# Patient Record
Sex: Male | Born: 1965
Health system: Southern US, Community
[De-identification: ages and names within clinical notes are randomized; demographics above are authoritative.]

## PROBLEM LIST (undated history)

## (undated) DIAGNOSIS — Z9861 Coronary angioplasty status: Secondary | ICD-10-CM

## (undated) DIAGNOSIS — M1990 Unspecified osteoarthritis, unspecified site: Secondary | ICD-10-CM

## (undated) DIAGNOSIS — E119 Type 2 diabetes mellitus without complications: Secondary | ICD-10-CM

## (undated) DIAGNOSIS — M109 Gout, unspecified: Secondary | ICD-10-CM

## (undated) DIAGNOSIS — I10 Essential (primary) hypertension: Secondary | ICD-10-CM

## (undated) DIAGNOSIS — Z87820 Personal history of traumatic brain injury: Secondary | ICD-10-CM

## (undated) DIAGNOSIS — K611 Rectal abscess: Secondary | ICD-10-CM

## (undated) DIAGNOSIS — I81 Portal vein thrombosis: Secondary | ICD-10-CM

## (undated) DIAGNOSIS — K55069 Acute infarction of intestine, part and extent unspecified: Secondary | ICD-10-CM

## (undated) DIAGNOSIS — E785 Hyperlipidemia, unspecified: Secondary | ICD-10-CM

## (undated) DIAGNOSIS — I491 Atrial premature depolarization: Secondary | ICD-10-CM

## (undated) DIAGNOSIS — G43909 Migraine, unspecified, not intractable, without status migrainosus: Secondary | ICD-10-CM

## (undated) DIAGNOSIS — R351 Nocturia: Secondary | ICD-10-CM

## (undated) DIAGNOSIS — I2510 Atherosclerotic heart disease of native coronary artery without angina pectoris: Secondary | ICD-10-CM

## (undated) DIAGNOSIS — I214 Non-ST elevation (NSTEMI) myocardial infarction: Secondary | ICD-10-CM

## (undated) DIAGNOSIS — C61 Malignant neoplasm of prostate: Secondary | ICD-10-CM

## (undated) DIAGNOSIS — R600 Localized edema: Secondary | ICD-10-CM

## (undated) DIAGNOSIS — T82867A Thrombosis of cardiac prosthetic devices, implants and grafts, initial encounter: Secondary | ICD-10-CM

## (undated) DIAGNOSIS — H5461 Unqualified visual loss, right eye, normal vision left eye: Secondary | ICD-10-CM

## (undated) DIAGNOSIS — Z87438 Personal history of other diseases of male genital organs: Secondary | ICD-10-CM

## (undated) DIAGNOSIS — I252 Old myocardial infarction: Secondary | ICD-10-CM

## (undated) DIAGNOSIS — I871 Compression of vein: Secondary | ICD-10-CM

## (undated) DIAGNOSIS — I639 Cerebral infarction, unspecified: Secondary | ICD-10-CM

## (undated) DIAGNOSIS — I739 Peripheral vascular disease, unspecified: Secondary | ICD-10-CM

## (undated) DIAGNOSIS — M1A9XX0 Chronic gout, unspecified, without tophus (tophi): Secondary | ICD-10-CM

## (undated) HISTORY — PX: CORONARY ANGIOPLASTY WITH STENT PLACEMENT: SHX49

## (undated) HISTORY — PX: TONSILLECTOMY: SUR1361

## (undated) HISTORY — DX: Atrial premature depolarization: I49.1

## (undated) HISTORY — PX: SEPTOPLASTY: SUR1290

## (undated) HISTORY — PX: TONSILLECTOMY AND ADENOIDECTOMY: SUR1326

## (undated) HISTORY — DX: Unqualified visual loss, right eye, normal vision left eye: H54.61

## (undated) HISTORY — PX: CORONARY ANGIOPLASTY: SHX604

---

## 1999-08-05 HISTORY — PX: SEPTOPLASTY: SUR1290

## 2001-11-01 ENCOUNTER — Ambulatory Visit (HOSPITAL_BASED_OUTPATIENT_CLINIC_OR_DEPARTMENT_OTHER): Admission: RE | Admit: 2001-11-01 | Discharge: 2001-11-01 | Payer: Self-pay | Admitting: Otolaryngology

## 2002-05-21 ENCOUNTER — Emergency Department (HOSPITAL_COMMUNITY): Admission: EM | Admit: 2002-05-21 | Discharge: 2002-05-21 | Payer: Self-pay | Admitting: Emergency Medicine

## 2002-07-06 ENCOUNTER — Encounter: Payer: Self-pay | Admitting: Internal Medicine

## 2002-07-06 ENCOUNTER — Inpatient Hospital Stay (HOSPITAL_COMMUNITY): Admission: EM | Admit: 2002-07-06 | Discharge: 2002-07-09 | Payer: Self-pay | Admitting: Emergency Medicine

## 2002-07-08 ENCOUNTER — Encounter: Payer: Self-pay | Admitting: Internal Medicine

## 2009-08-04 DIAGNOSIS — I639 Cerebral infarction, unspecified: Secondary | ICD-10-CM

## 2009-08-04 DIAGNOSIS — I219 Acute myocardial infarction, unspecified: Secondary | ICD-10-CM

## 2009-08-04 HISTORY — DX: Cerebral infarction, unspecified: I63.9

## 2009-08-04 HISTORY — DX: Acute myocardial infarction, unspecified: I21.9

## 2010-05-04 HISTORY — PX: INGUINAL HERNIA REPAIR: SUR1180

## 2010-07-01 ENCOUNTER — Inpatient Hospital Stay (HOSPITAL_COMMUNITY): Admission: EM | Admit: 2010-07-01 | Discharge: 2010-07-03 | Payer: Self-pay | Admitting: Emergency Medicine

## 2010-07-01 DIAGNOSIS — I6930 Unspecified sequelae of cerebral infarction: Secondary | ICD-10-CM

## 2010-07-01 DIAGNOSIS — Z955 Presence of coronary angioplasty implant and graft: Secondary | ICD-10-CM

## 2010-07-01 HISTORY — DX: Unspecified sequelae of cerebral infarction: I69.30

## 2010-07-01 HISTORY — DX: Presence of coronary angioplasty implant and graft: Z95.5

## 2010-07-02 ENCOUNTER — Encounter (INDEPENDENT_AMBULATORY_CARE_PROVIDER_SITE_OTHER): Payer: Self-pay | Admitting: Cardiovascular Disease

## 2010-07-04 DIAGNOSIS — I214 Non-ST elevation (NSTEMI) myocardial infarction: Secondary | ICD-10-CM | POA: Diagnosis present

## 2010-07-04 HISTORY — PX: CORONARY ANGIOPLASTY WITH STENT PLACEMENT: SHX49

## 2010-08-24 ENCOUNTER — Encounter: Payer: Self-pay | Admitting: Family Medicine

## 2010-10-15 LAB — HEMOGLOBIN AND HEMATOCRIT, BLOOD: HCT: 39.6 % (ref 39.0–52.0)

## 2010-10-15 LAB — BASIC METABOLIC PANEL
CO2: 26 mEq/L (ref 19–32)
Calcium: 8.5 mg/dL (ref 8.4–10.5)
Chloride: 113 mEq/L — ABNORMAL HIGH (ref 96–112)
Creatinine, Ser: 0.84 mg/dL (ref 0.4–1.5)
Creatinine, Ser: 1.01 mg/dL (ref 0.4–1.5)
GFR calc Af Amer: >60 mL/min (ref >60)
GFR calc Af Amer: >60 mL/min (ref >60)
GFR calc Af Amer: >60 mL/min (ref >60)
GFR calc non Af Amer: >60 mL/min (ref >60)
GFR calc non Af Amer: >60 mL/min (ref >60)
Potassium: 3.7 mEq/L (ref 3.5–5.1)
Sodium: 138 mEq/L (ref 135–145)
Sodium: 139 mEq/L (ref 135–145)

## 2010-10-15 LAB — POCT CARDIAC MARKERS: Myoglobin, poc: 95.3 ng/mL (ref 12–200)

## 2010-10-15 LAB — DIFFERENTIAL
Basophils Relative: 0 % (ref 0–1)
Eosinophils Absolute: 0.1 10*3/uL (ref 0.0–0.7)
Monocytes Relative: 8 % (ref 3–12)
Neutrophils Relative %: 62 % (ref 43–77)

## 2010-10-15 LAB — TSH: TSH: 0.999 u[IU]/mL (ref 0.350–4.500)

## 2010-10-15 LAB — POCT I-STAT, CHEM 8
Creatinine, Ser: 1 mg/dL (ref 0.4–1.5)
Glucose, Bld: 116 mg/dL — ABNORMAL HIGH (ref 70–99)
Hemoglobin: 16.3 g/dL (ref 13.0–17.0)
Potassium: 4 mEq/L (ref 3.5–5.1)

## 2010-10-15 LAB — PROTIME-INR
INR: 1.06 (ref 0.00–1.49)
Prothrombin Time: 13.9 seconds (ref 11.6–15.2)

## 2010-10-15 LAB — CBC
Hemoglobin: 15.8 g/dL (ref 13.0–17.0)
MCH: 29.5 pg (ref 26.0–34.0)
MCH: 29.6 pg (ref 26.0–34.0)
MCHC: 34.1 g/dL (ref 30.0–36.0)
Platelets: 235 10*3/uL (ref 150–400)
Platelets: 267 10*3/uL (ref 150–400)
RBC: 4.57 MIL/uL (ref 4.22–5.81)
WBC: 9.4 10*3/uL (ref 4.0–10.5)

## 2010-10-15 LAB — CK TOTAL AND CKMB (NOT AT ARMC)
CK, MB: 28.8 ng/mL (ref 0.3–4.0)
Relative Index: 11 — ABNORMAL HIGH (ref 0.0–2.5)

## 2010-10-15 LAB — D-DIMER, QUANTITATIVE: D-Dimer, Quant: <0.22 ug/mL-FEU (ref 0.00–0.48)

## 2010-10-15 LAB — HEMOGLOBIN A1C
Hgb A1c MFr Bld: 6 % — ABNORMAL HIGH (ref <5.7)
Mean Plasma Glucose: 126 mg/dL — ABNORMAL HIGH (ref <117)

## 2010-10-15 LAB — HEPARIN LEVEL (UNFRACTIONATED): Heparin Unfractionated: <0.1 IU/mL — ABNORMAL LOW (ref 0.30–0.70)

## 2010-10-15 LAB — APTT: aPTT: 65 seconds — ABNORMAL HIGH (ref 24–37)

## 2010-10-15 LAB — LIPID PANEL: Triglycerides: 187 mg/dL — ABNORMAL HIGH (ref <150)

## 2010-12-20 NOTE — Discharge Summary (Signed)
NAME:  Dylan [name], Dylan [name]                      ACCOUNT NO.:  [id number]   MEDICAL RECORD NO.:  [id number]                   PATIENT TYPE:  INP   LOCATION:  5702                                 FACILITY:  [facility]   PHYSICIAN:  Lina Sar, M.D. [facility]               DATE OF BIRTH:  [birth year]   DATE OF ADMISSION:  07/06/2002  DATE OF DISCHARGE:  07/09/2002                                 DISCHARGE SUMMARY   ADMITTING DIAGNOSES:  1. Right-sided abdominal pain, possibly secondary to pancreatitis.  2. Rule out cholecystitis.  3. Hypertension, likely exacerbated by acute pain.  4. Ongoing tobacco abuse.  5. Status post tonsillectomy and adenoidectomy.  6. Status post septoplasty for repair of deviated septum.   DISCHARGE DIAGNOSES:  1. Right-sided abdominal pain, question passed common bile duct stone.  2. Possible mild pancreatitis owing to biliary stone.  3. Possible musculoskeletal pain as source for abdominal pain.  4. Somewhat complex left renal cyst; this will require a follow-up CT in six     months.  5. Bibasilar atelectasis; CT scan of the chest showed no evidence for     pulmonary embolus.  6. Bilateral pleural effusions and nodularity on CT of chest.  Dr. Delford Field     feels that these are granulomas and do not need follow-up CT.   PROCEDURES:  None.   CONSULTATIONS:  1. Shan Levans, M.D.  2. Adolph Pollack, M.D.   BRIEF HISTORY:  The patient is a healthy 45 year old white male.  A few days  prior to admission, on a Saturday, he developed acute right-sided abdominal  pain persisting over the next few days.  On July 04, 2002 he was  evaluated at [facility] where he had abdominal x-rays and  laboratory tests.  His x-rays were unremarkable.  Before the labs returned  he was prescribed p.r.n. Vicodin as well as a course of Levaquin, presumably  thinking he may have diverticulitis.  However, when the lipase returned this  was slightly elevated at 94 and the  doctor contacted him at home with an  appointment for him to be seen by Dr. Anselmo Rod on July 21, 2002.  The pain became such that the Vicodin was not holding it.  He was not having  any nausea, vomiting, or sweats, but was having anorexia.  Therefore, he  presented to the emergency room for evaluation by the on-call GI team, which  was [facility].  Dr. Juanda Chance evaluated him and admitted him for  management of pain possibly secondary to pancreatitis.  Repeat labs in the  emergency room revealed normal amylase and lipase but slight elevation of  AST and ALT.   LABORATORY DATA:  White blood cell count 10.3; hemoglobin 17; hematocrit  50.7; platelets 329,000; MCV 84.3.  PT 13.6, INR 1.0, PTT 32.  Sodium 138,  potassium 3.9, glucose 90, BUN 12, creatinine 0.8.  Total bilirubin 0.7,  alkaline phosphatase 92.  AST went from 57 down to 27.  ALT went from 111  down to 64.  Hepatitis A antibody negative, hepatitis B surface antigen  negative, hepatitis B core antibody negative, hepatitis B surface antibody  negative, hepatitis C antibody negative.  Urinalysis was unremarkable and  urine culture did not have any growth.  Mono screening was negative.   Besides the lab tests which revealed some mild abnormality of the  transaminases, he also underwent imaging studies.  Ultrasound of the abdomen  showed fatty infiltration in the liver, a left renal cyst.  Pancreas  unfortunately was not well visualized.  CT scan of the abdomen showed  probable tiny amount of fluid along the inferior liver edge of uncertain  etiology.  This tracked along the retroperitoneal vein.  The portal vein,  splenic vein, and superior mesenteric vein were patent.  The left renal cyst  was also again visualized and was minimally complex.  This renal cyst was  felt to be probably benign but categorized as Bosniak category 46F type  lesion which would require follow-up with CT scan in six months.  Also seen  were  bibasilar subsegmental atelectasis.  Pelvic views on CT were normal.  Hepatobiliary scan was normal.  Chest CT revealed no pulmonary embolus; some  scattered granulomas were also seen.  A nonspecific nodule in the left upper  lobe evident.  Six-month follow-up chest CT recommended.  Small hilar and  mediastinal lymph node, possibly reactive in nature.   HOSPITAL COURSE:  The patient was admitted for bowel rest and IV fluids.  On  hospital day #2 pain was well controlled with regular doses of Demerol.  The  patient underwent a series of tests to define whether or not he had any  gallstones in the duct or any cholecystitis.  He was seen by Dr. Abbey Chatters  for surgical evaluation.  He felt that possibly he could have had passage of  a single gallstone.  Other diagnosed in the differential included ruptured  hepatic cyst or hemangioma, costochondritis, and musculoskeletal source of  pain.   The patient's transaminases were elevated at about two to three times normal  when he came in but total bilirubin and alkaline phosphatase both were  within normal limits.  These transaminases rapidly declined over the course  of his three-day hospital stay.  No stones or evidence for cholecystitis or  cholelithiasis were identified.  Dr. Abbey Chatters prescribed some Robaxin.  In  the final assessment Dr. Abbey Chatters felt that the pain which he had observed  was probably musculoskeletal in origin.   Regarding abnormal CT scan, the patient was evaluated by Dr. Shan Levans.  Hypoxemia had been noted on spot check of oxygen saturation and he felt that  this was secondary to splinting associated with his pain.  He thought that  the nodules on CT were granulomas and would not need a follow-up CT.  Dr.  Delford Field raised the question of whether or not the patient's pain might be  urologic in nature.  However, on imaging studies he did not have any evidence for gallstones.  He did have the slightly complicated renal  cyst  observed.  No blood was seen in the urine.   In any event, the patient's right upper quadrant pain had improved  significantly to the point where the patient was felt ready for discharge  home.  He is to follow up p.r.n. with Dr. Juanda Chance.  He should see Dr. Yetta Barre  in  the next month or so for follow-up. Again, per the CT findings regarding  the renal cyst, this will require a follow-up CT scan but this can be  arranged by Dr. Yetta Barre or any of the other doctors at [facility].     Brett Canales, P.A. [facility]                    Lina Sar, M.D. [facility]    SG/MEDQ  D:  09/16/2002  T:  09/16/2002  Job:  [id number]   cc:   Adolph Pollack, M.D.  [address]  [place]  [place] [address]  Fax: [phone]   Knox Royalty, M.D.  [facility]

## 2010-12-20 NOTE — Discharge Summary (Signed)
   NAME:  Dylan [name], Dylan [name]                      ACCOUNT NO.:  [id number]   MEDICAL RECORD NO.:  [id number]                   PATIENT TYPE:  INP   LOCATION:  5702                                 FACILITY:  [facility]   PHYSICIAN:  Lina Sar, M.D. [facility]               DATE OF BIRTH:  May 13, 1966   DATE OF ADMISSION:  07/06/2002  DATE OF DISCHARGE:  07/09/2002                                 DISCHARGE SUMMARY   ADDENDUM:   DISCHARGE MEDICATIONS:  Vicodin one to two p.o. q.6h. p.r.n. pain.     Brett Canales, P.A. [facility]                    Lina Sar, M.D. [facility]    SG/MEDQ  D:  09/16/2002  T:  09/16/2002  Job:  [id number]   cc:   Adolph Pollack, M.D.  [address]  [place]  [place] [address]  Fax: [phone]   Knox Royalty, M.D.

## 2010-12-20 NOTE — Op Note (Signed)
[facility]  Patient:    Dylan [name], Dylan [name] Visit Number: [id number] MRN: [id number]          Service Type: DSU Location: [facility] Attending Physician:  Susy Frizzle Dictated by:   Jeannett Senior Pollyann Kennedy, M.D. Proc. Date: 11/01/01 Admit Date:  11/01/2001   CC:         Bertram Millard. Hyacinth Meeker, M.D.   Operative Report  PREOPERATIVE DIAGNOSIS: 1. Deviated septum. 2. Inferior turbinate hypertrophy.  POSTOPERATIVE DIAGNOSIS: 1. Deviated septum. 2. Inferior turbinate hypertrophy.  OPERATION PERFORMED: 1. Nasal septoplasty. 2. Submucous resection of the right inferior turbinate. 3. Outfracture of the left inferior turbinate.  SURGEON:  Jefry H. Pollyann Kennedy, M.D.  ANESTHESIA:  General endotracheal.  COMPLICATIONS:  None.  ESTIMATED BLOOD LOSS:  30 cc.  OPERATIVE FINDINGS:  Severe leftward deviation of the cartilaginous and bony septum.  Complete obstruction of the left inferior meatus.  The septum was completely obscuring the inferior turbinate anteriorly.  Posteriorly, the inferior turbinate was thickened and elongated with posterior obstruction.  On the right side the inferior turbinate was severely enlarged with very dramatic bony hypertrophy.  There was a mucosal synechia between the midportion of the nasal septum to the midportion of the inferior turbinate on the right side. The patient tolerated the procedure well, was awakened, extubated and transferred to recovery in stable condition.  REFERRING PHYSICIAN:  Bertram Millard. Hyacinth Meeker, M.D.  INDICATIONS FOR PROCEDURE:  The patient is a 45 year old with a history of severe nasal obstruction and recurring sinus infections, especially, left maxillary sinus.  The risks, benefits, alternatives and complications of the procedure were explained to the patient, who seemed to understand and agreed to surgery.  DESCRIPTION OF PROCEDURE:  The patient was taken to the operating room and placed on the operating table in  the supine position.  Following induction of general endotracheal anesthesia, the patient was draped in the standard fashion.  Oxymetazoline spray was used preoperatively in the nose for decongesting.  1% Xylocaine with 1:100,000 epinephrine was infiltrated into the septum and columella and the inferior turbinates bilaterally.  A total of 4 cc was used.  Oxymetazoline soaked pledgets were then placed bilaterally in the nasal cavities.  1 - Nasal septoplasty.  A left hemitransfixion incision was created with a 15 scalpel to approach the septal cartilage and the mucoperichondrial flap was developed posteriorly to the sphenoid rostrum.  Several tears occurred in the mucosa on the left side because of the severe curvature.  The mucosa was kept intact throughout the procedure on the right side.  The superior aspect of the bony cartilaginous junction was divided with a Therapist, nutritional and a similar flap was developed posteriorly down the right side.  The ethmoid plate was severely deviated to the left inferiorly.  The entire ethmoid plate was resected.  This allowed better visualization of the posterior nasal cavity, especially on the left  side.  The upper aspect of the vomerine bone was removed also.  the posterior and inferior aspect was kept intact as that was straight and thin.  The quadrangular cartilage was buckled on the left side posteriorly and inferiorly.  This segment was resected.  The more anterior quadrangular cartilage was elongated and compressed with a convexity on the left side.  A 2 mm strip was resected all the way to the caudal limit and this allowed the remaining cartilage to relax and lie more towards the midline. The maxillary crest was partially widened and deflected to the left  and was trimmed using a 4 mm osteotome.  The mucosal incision was then reapproximated with 4-0 chromic suture.  4-0 plain gut was used to quilt the septal flaps and to try to reappose the mucosal  tears on the left side.  Silastic splints were created bilaterally, coated with bacitracin ointment and secured in place with a nylon suture through-and-through the septum.  2 - Submucous resection of right inferior turbinate.  The leading edge of the inferior turbinate on the right was incised in a vertical fashion with a 15 scalpel.  A Freer elevator was used to elevate mucosa off the bony turbinate all the way back posteriorly from the anterior edge.  A very large fragment of turbinate bone was resected with Lenoria Chime forceps.  The remaining turbinate was outfractured toward the lateral aspect.  3 - Outfracture of left inferior turbinate.  The posterior aspect of the left inferior turbinate was hypertrophied.  It was simply outfractured as the bone was very thin.  This provided increased airway on the left side posteriorly. The nasal cavities and pharynx were suctioned of blood and secretions and the nasal cavities were packed with rolled up Telfa gauze coated with bacitracin ointment.  The patient was then awakened, extubated and transferred to recovery. Dictated by:   Jeannett Senior Pollyann Kennedy, M.D. Attending Physician:  Susy Frizzle DD:  11/01/01 TD:  11/01/01 Job: [id number]

## 2010-12-20 NOTE — Consult Note (Signed)
NAME:  Dylan [name], Dylan [name]                      ACCOUNT NO.:  [id number]   MEDICAL RECORD NO.:  [id number]                   PATIENT TYPE:  INP   LOCATION:  5702                                 FACILITY:  [facility]   PHYSICIAN:  Shan Levans, M.D. [facility]            DATE OF BIRTH:  March 23, 1966   DATE OF CONSULTATION:  DATE OF DISCHARGE:  07/09/2002                                   CONSULTATION   CHIEF COMPLAINT:  Right flank pain.   HISTORY OF PRESENT ILLNESS:  45 year old, white male, previously healthy  smoker had the acute onset six days ago of right upper quadrant pain and  right mid back pain two hours after a large meal.  The pain was described as  fullness, then sharp, stabbing right upper quadrant and into the right  flank.  Currently now, he describes the pain beginning in the right flank  and rotating through the right upper quadrant anteriorly.  He has denied any  nausea or vomiting.  Bowel movements are okay.  He had a mild elevation in  the lipase, had a normal CBC.  Had an abnormal urinalysis with trace blood  and white cells in the urine.  The patient was given Levaquin transiently,  he did not really improve but presented to the emergency room with  increasing abdominal pain and flank pain.  Currently, he denies any fever,  chills, sweats, nausea, vomiting or diarrhea.  We are asked to see the  patient because there is a question of whether some of this pain could be  pleuritic.  He had a spiral CT done of the chest this afternoon showing no  evidence of pulmonary emboli.  There were some nodules seen, we are asked to  address the nodules as well.  There is also a mild reduction in SPO2, we are  asked to address this as well.   PAST MEDICAL HISTORY:  No chronic illnesses, has had a tonsillectomy and  septoplasty.   ALLERGIES:  None.   MEDICATIONS:  Currently Protonix, Demerol p.r.n., Phenergan.   SOCIAL HISTORY:  The patient is a smoker, occasionally drinks but has  not  been drinking recently.  Otherwise, noncontributory.   REVIEW OF SYSTEMS:  Noncontributory.   PHYSICAL EXAMINATION:  GENERAL:  This is a pleasant, well-developed, well-  nourished, white male in no acute distress.  VITAL SIGNS:  Temperature is 98, blood pressure 146/92, pulse 82,  respirations 20, saturation 93% on room air.  CHEST:  Showed to be entirely clear to auscultation and percussion, there is  no evidence of wheeze or rhonchi.  There are good breath sounds bilaterally.  CARDIAC EXAM:  Showed a regular rate and rhythm at S3, normal S1 and S2.  ABDOMEN:  Slightly tender in the right upper quadrant, soft.  Liver was not  enlarged.  Right flank also was tender to palpation.  The remaining portions  of abdominal exam are unremarkable.  EXTREMITIES:  Showed no clubbing, edema or venous disease.  NEUROLOGICAL EXAM:  Intact.  HEENT:  Showed no jugular venous distention, no lymphadenopathy, oropharynx  clear.  NECK:  Supple.   LABORATORY DATA:  Hepatitis antibodies are all negative.  SGOT 44, SGPT 77,  alkaline phosphatase 71, total bilirubin 1.0.  PT and PTT normal.  White  count 10.3, hemoglobin 17, sodium 138, potassium 4.9 and chloride 106.  CO2  25, BUN 12, creatinine 0.8, blood sugar of 90.  Urinalysis showed small  bilirubin, trace leukocyte esterase, negative hemoglobin here with 3-6 white  cells, rare bacteria.  At the Urgent Care Clinic, there were trace red cells  seen on the analysis and there was some leukocyte esterase seen as well.  Trace leukocyte seen.  Abdominal CT unremarkable.  Abdominal ultrasound  unremarkable.  There was a left renal cyst 3 cm in the left upper pole of  the left kidney.  CT of the chest showed a few scattered granulomas, no  pulmonary embolism, low lung volumes in the right base.  No pleural disease.   IMPRESSION:  The patient does not likely has pleuritic chest pain, but  rather this appears to be an intra-abdominal source of pain.  The  patient  may have passed a gallbladder stone, so biliary colic is in differential  diagnosis with increased liver function tests and lipase.  Also,  pyelonephritis or renal colic with renal stone disease is in the  differential with an abnormal urinalysis.  Low SPO2 is due to splinting and  low lung volumes.  The nodules on the CT of the chest are granulomas and do  not need follow up.  The patient has not had pulmonary embolism.  Low back  pain from disk disease is also in the differential, but appears to be less  likely.   RECOMMENDATIONS:  Would suggest a urology consultation and re-check  urinalysis and urine C&S.  Also administer incentive spirometry to the  patient.  Follow the patient expectantly.                                               Shan Levans, M.D. [facility]    PW/MEDQ  D:  07/08/2002  T:  07/09/2002  Job:  [id number]

## 2010-12-20 NOTE — H&P (Signed)
NAME:  Dylan [name], Dylan [name]                      ACCOUNT NO.:  [id number]   MEDICAL RECORD NO.:  [id number]                   PATIENT TYPE:  INP   LOCATION:  1828                                 FACILITY:  [facility]   PHYSICIAN:  Brett Canales, P.A. [facility]              DATE OF BIRTH:  May 17, 1966   DATE OF ADMISSION:  07/06/2002  DATE OF DISCHARGE:                                HISTORY & PHYSICAL   PRIMARY CARE PHYSICIAN:  Uses the [facility], Dr. Knox Royalty, for p.r.n. needs.   CHIEF COMPLAINT:  Right upper quadrant and right mid to upper back pain  beginning July 02, 2002.   HISTORY OF PRESENT ILLNESS:  This is a healthy 45 year old white male. Over  the weekend on Saturday, he developed acute  right upper and mid back pain  and then  right upper quadrant abdominal pain. This persisted over the next  few days. He drove back from the beach on Sunday, and by July 04, 2002,  he went to [facility] for evaluation. They obtained laboratory  tests and abdominal x-rays. His lipase there was elevated  at 94. The doctor  called his home and said that he had pancreatitis and he did have an  appointment set up to see Dr. Anselmo Rod on July 21, 2002. He had  apparently plain abdominal x-ray but did not have an abdominal ultrasound.  Dr. Yetta Barre prescribed Vicodin for p.r.n. use as well as a course of Levaquin,  of which he has had approximately 3 out of 10.   The patient has been using up to three Vicodin at a time with subadequate  pain relief. He denies any nausea or vomiting. He has not had  any sweats or  malaise, however, he does say that he feels full and that  his appetite has  been poor since the onset of pain. He denies previous episodes like  this or  minor episodes of similar symptoms.   PAST MEDICAL HISTORY:  1. Status post septoplasty for deviated septum.  2. Status post tonsillectomy and adenoidectomy.   SOCIAL HISTORY:  The patient is  married and he lives in [place]. He and  his wife have 1 and 4-year-old children. He smokes one pack per day. He does  not drink alcohol, and his prior use is less than a six pack a week, if  that.   FAMILY HISTORY:  His mother has a history of Crohn's disease. His father has  had his gallbladder out when he was either in his late 40s or early 110s. His  father also has vascular disease history with hypertension, coronary artery  disease. His  father is status post CABG and abdominal aortic aneurysm  repair. He also has vertebral stenosis.   CURRENT MEDICATIONS:  Just p.r.n. Vicodin recently.  The patient denies use  of any chronic over-the-counter medications, BC, Goodies, NSAIDS included.  REVIEW OF SYSTEMS:  GI:  No history of prior abdominal pain symptoms, no  history of GERD, no trouble swallowing. GENERAL: No weight loss, no history  of chronic diseases. CARDIOVASCULAR:  Denies palpitations or chest pain.  MUSCULOSKELETAL:  Does have some aching in his back but nothing like the  back he is currently having. DERMATOLOGIC:  Denies jaundice. UROLOGIC:  Denies dysuria or decreased urine output. No tea-colored urine.   PHYSICAL EXAMINATION:  GENERAL:  The patient is pleasant, looks well, but he  is uncomfortable. He definitely not toxic.  VITAL SIGNS:  Blood pressure 166/98, pulse 100, respirations 18, temperature  96.4.  HEENT:  There is no scleral icterus, no conjunctival pallor. Extraocular  movements intact. Oropharynx, the mucosa is moist and clear. Dentition is in  good repair.  NECK:  No JVD, goiters or masses.  CHEST:  Clear to auscultation and percussion bilaterally, no cough with deep  inspiration.  COR:  Regular rate and rhythm, no murmurs, rubs, gallops.  ABDOMEN:  Soft, nondistended. There are  active bowel sounds. There is  tenderness without rebound or guarding in the right upper quadrant. No  hepatosplenomegaly or masses. There is some CVA tenderness.  RECTAL AND  GU:  Exams were deferred.  EXTREMITIES:  No cyanosis, clubbing or edema. His capillary refill to the  toes is brisk.  NEUROLOGIC:  The patient is alert and oriented x 3. He has no tremor. Grip  strength is 5/5 and he is able to stand without assistance.  MUSCULOSKELETAL:  No gross joint deformities of the knees, hands, feet or  spine.   LABORATORY DATA:  Pending at this time are CBC, CMET, amylase and lipase.  Labs faxed over from Urgent Care are not all legible, but the urinalysis  showed a trace amount of protein, trace amount of  leukocytes but no  nitrites. The lipase was 94. Unfortunately, the CBC  is illegible.   IMPRESSION:  1. Pancreatitis, probably biliary in  origin.  2. Rule out cholecystitis.  3. Hypertension. Blood pressure currently is likely elevated secondary to     acute pain, but we need to rule out essential underlying hypertension,     especially given his family history.  4. Tobacco abuse.   PLAN:  Await pending labs. The patient is to be admitted to the hospital  service of Dr. Lina Sar, and we will obtain an ultrasound of the abdomen.  The patient may require general surgical evaluation as well, but we will  wait until we get more data before calling general surgery.                                               Brett Canales, P.A. [facility]    SG/MEDQ  D:  07/06/2002  T:  07/06/2002  Job:  [id number]

## 2010-12-20 NOTE — Consult Note (Signed)
NAME:  Dylan [name], Dylan [name]                      ACCOUNT NO.:  [id number]   MEDICAL RECORD NO.:  [id number]                   PATIENT TYPE:  INP   LOCATION:  5702                                 FACILITY:  [facility]   PHYSICIAN:  Adolph Pollack, M.D.            DATE OF BIRTH:  March 29, 1966   DATE OF CONSULTATION:  07/09/2002  DATE OF DISCHARGE:  07/09/2002                                   CONSULTATION   REASON FOR CONSULTATION:  Right flank and right upper quadrant pain.   HISTORY OF PRESENT ILLNESS:  The patient is an otherwise healthy 45 year old  male who six days ago ate a large breakfast, began feeling very full.  Two  hours after that, he noticed sharp pain in the right flank and right back  area, going to the right upper quadrant.  No nausea or vomiting, no fever or  chills.  The pain was fairly persistent.  He [place] from the beach and  went to [facility].  There his blood work was done.  He was told  that he had an elevated lipase and had pancreatitis.  He was also told that  he had a little bit of blood in his urine and some leukocytes.  He was  started on empiric Levaquin.  The pain continued to persist, and he  presented to the emergency department.  He was taking Vicodin without  substantial pain relief.  He has never had anything like this before.  He  denies any jaundice.   PAST MEDICAL HISTORY:  No chronic illnesses.   PAST SURGICAL HISTORY:  1. Septoplasty.  2. Tonsillectomy.  3. Adenoidectomy.   ALLERGIES:  None.   CURRENT MEDICATIONS:  1. Protonix.  2. Demerol p.r.n.  3. Phenergan p.r.n.   SOCIAL HISTORY:  He is a smoker.  Occasional alcohol use.  He is married and  has two children.   FAMILY HISTORY:  Mother has Crohn's disease.  Father has coronary artery  disease and peripheral vascular disease.  His father did have a  cholecystectomy.   REVIEW OF SYSTEMS:  GI:  He denies any hepatitis, peptic ulcer disease,  diverticulitis, or change  in bowel habits.  PULMONARY:  He has had  bronchitis in the distant past. No pneumonia or asthma, and no dyspnea.  UROLOGIC:  No history of kidney stones and no painful urination.   PHYSICAL EXAMINATION:  GENERAL:  A healthy-appearing, well nourished, well  developed male in no acute distress.  VITAL SIGNS:  Temperature 98.2, blood pressure 146/92, pulse 82.  SKIN:  Warm, dry, no jaundice.  HEENT:  Eyes:  Extraocular motions intact with sclerae clear.  ABDOMEN:  Soft.  It is nontender, nondistended.  No palpable masses or  organomegaly.  Active bowel sounds are noted.  BACK:  There is some mild right CVA tenderness right over the bony area.  No  palpable masses or heat in this region.  LABORATORY DATA:  Laboratory data was reviewed.  It is remarkable for a mild  elevation of SGOT at 44 and SGPT of 77 yesterday.  This is down from his  admission liver function tests which demonstrated a SGOT of 57 and SGPT of  111.  Amylase and lipase upon admission were normal.  White blood cell count  is 10,300.  Hemoglobin 17.   Abdominal ultrasound demonstrates normal gallbladder and a left renal cyst  with probably fatty infiltration of the liver.   Hepatobiliary scan is normal with normal ejection fraction.   CT scan of the abdomen demonstrates a probable small amount of fluid in the  inferior aspect of the right lobe of the liver area.  No other acute  pathology noted.   IMPRESSION:  Right flank back and right upper quadrant pain with mild  elevation in liver function tests and one transient hyperlipasemia on  Demerol injections.  Now he describes the pain as more in the back and  spasmodic in nature.  Differential diagnoses would include passage of a  single gallstone, but I suspect the pain should have resolved by now.  He  could have a ruptured hepatic cyst of small hemangioma and focal peritoneal  irritation.  This also could be costochondritis or some other  musculoskeletal  phenomena.   PLAN:  I do not see a strong indication for cholecystectomy at this time.  I  would try some nonsteroidals and muscle relaxants.  I would stop the Demerol  and try an oral analgesic.  I will be glad to follow up with him.                                               Adolph Pollack, M.D.    Kari Baars  D:  07/08/2002  T:  07/09/2002  Job:  [id number]   cc:   Lina Sar, M.D. [facility]

## 2011-06-05 HISTORY — PX: KNEE ARTHROSCOPY W/ MENISCAL REPAIR: SHX1877

## 2011-06-24 ENCOUNTER — Other Ambulatory Visit: Payer: Self-pay | Admitting: Internal Medicine

## 2011-06-24 DIAGNOSIS — M25562 Pain in left knee: Secondary | ICD-10-CM

## 2011-06-25 ENCOUNTER — Ambulatory Visit
Admission: RE | Admit: 2011-06-25 | Discharge: 2011-06-25 | Disposition: A | Discharge status: Home / Self Care | Payer: BC Managed Care – PPO | Source: Ambulatory Visit | Attending: Internal Medicine | Admitting: Internal Medicine

## 2011-06-25 DIAGNOSIS — M25562 Pain in left knee: Secondary | ICD-10-CM

## 2011-06-25 MED ORDER — GADOBENATE DIMEGLUMINE 529 MG/ML IV SOLN
20.0000 mL | Freq: Once | INTRAVENOUS | Status: AC | PRN
  Administered 2011-06-25: 20 mL via INTRAVENOUS

## 2011-07-09 ENCOUNTER — Other Ambulatory Visit: Payer: Self-pay

## 2012-02-01 ENCOUNTER — Encounter (HOSPITAL_BASED_OUTPATIENT_CLINIC_OR_DEPARTMENT_OTHER): Payer: Self-pay | Admitting: *Deleted

## 2012-02-01 ENCOUNTER — Emergency Department (HOSPITAL_BASED_OUTPATIENT_CLINIC_OR_DEPARTMENT_OTHER)
Admission: EM | Admit: 2012-02-01 | Discharge: 2012-02-01 | Disposition: A | Discharge status: Home Health | Payer: Worker's Compensation | Attending: Emergency Medicine | Admitting: Emergency Medicine

## 2012-02-01 ENCOUNTER — Emergency Department (HOSPITAL_BASED_OUTPATIENT_CLINIC_OR_DEPARTMENT_OTHER): Payer: Worker's Compensation

## 2012-02-01 DIAGNOSIS — Z7982 Long term (current) use of aspirin: Secondary | ICD-10-CM | POA: Insufficient documentation

## 2012-02-01 DIAGNOSIS — Z79899 Other long term (current) drug therapy: Secondary | ICD-10-CM | POA: Insufficient documentation

## 2012-02-01 DIAGNOSIS — M79609 Pain in unspecified limb: Secondary | ICD-10-CM | POA: Insufficient documentation

## 2012-02-01 DIAGNOSIS — Y9389 Activity, other specified: Secondary | ICD-10-CM | POA: Insufficient documentation

## 2012-02-01 DIAGNOSIS — IMO0002 Reserved for concepts with insufficient information to code with codable children: Secondary | ICD-10-CM | POA: Insufficient documentation

## 2012-02-01 DIAGNOSIS — Y9269 Other specified industrial and construction area as the place of occurrence of the external cause: Secondary | ICD-10-CM | POA: Insufficient documentation

## 2012-02-01 DIAGNOSIS — S60229A Contusion of unspecified hand, initial encounter: Secondary | ICD-10-CM

## 2012-02-01 DIAGNOSIS — Y990 Civilian activity done for income or pay: Secondary | ICD-10-CM | POA: Insufficient documentation

## 2012-02-01 NOTE — ED Provider Notes (Signed)
History  This chart was scribed for Hilario Quarry, MD by Bennett Scrape. This patient was seen in room MH03/MH03 and the patient's care was started at 3:04PM  CSN: [id number]  Arrival date & time 02/01/12  1335   First MD Initiated Contact with Patient 02/01/12 1504      Chief Complaint  Patient presents with  . Hand Injury    Patient is a 46 y.o. male presenting with hand injury. The history is provided by the patient. No language interpreter was used.  Hand Injury  The incident occurred 6 to 12 hours ago. The incident occurred at work. The injury mechanism was compression. The pain is present in the right hand. The pain has been constant since the incident. He reports no foreign bodies present. The symptoms are aggravated by use. He has tried ice for the symptoms.    Dylan [name] is a 46 y.o. male who presents to the Emergency Department complaining of a right hand injury that occurred around 6 hours ago. 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 He states that the pain is worse with use of the right hand. He reports using ice with mild improvement in the swelling. He denies having any other symptoms currently such as fever, nausea, and chest pain. He has a h/o MI, CAD and HTN. He is an occasional alcohol user but denies smoking.  He works for [employer]   Past Medical History  Diagnosis Date  . Hypertension   . Coronary artery disease     Past Surgical History  Procedure Date  . Coronary stent placement   . Septoplasty     History reviewed. No pertinent family history.  History  Substance Use Topics  . Smoking status: Never Smoker   . Smokeless tobacco: Not on file  . Alcohol Use: Yes      Review of Systems  Musculoskeletal:       Right hand pain and swelling  All other systems reviewed and are negative.    Allergies  Review of patient's allergies indicates no  known allergies.  Home Medications   Current Outpatient Rx  Name Route Sig Dispense Refill  . ASPIRIN 325 MG PO TABS Oral Take 325 mg by mouth daily.    Marland Kitchen HYDROCHLOROTHIAZIDE 25 MG PO TABS Oral Take 25 mg by mouth daily.    Marland Kitchen LOSARTAN POTASSIUM 25 MG PO TABS Oral Take 25 mg by mouth daily.    Marland Kitchen METOPROLOL TARTRATE 50 MG PO TABS Oral Take 50 mg by mouth 2 (two) times daily.      Triage Vitals: BP 136/91  Pulse 89  Temp 98.6 F (37 C) (Oral)  Resp 16  Ht 6\' 1"  (1.854 m)  Wt 250 lb (113.399 kg)  BMI 32.98 kg/m2  Physical Exam  Nursing note and vitals reviewed. Constitutional: He is oriented to person, place, and time. He appears well-developed and well-nourished. No distress.  HENT:  Head: Normocephalic and atraumatic.  Eyes: EOM are normal.  Neck: Neck supple. No tracheal deviation present.  Cardiovascular: Normal rate.   Pulmonary/Chest: Effort normal. No respiratory distress.  Musculoskeletal: Normal range of motion.       Swelling along the right hand, fingers are pink with ,1 cap refill, contusion on dorsum of right hand proximal to the ACP joint, tenderness to the ACP joint of the 3rd and 4th digits  Neurological: He is alert  and oriented to person, place, and time.  Skin: Skin is warm and dry.  Psychiatric: He has a normal mood and affect. His behavior is normal.    ED Course  Procedures (including critical care time)  DIAGNOSTIC STUDIES: None performed   COORDINATION OF CARE: 3:10Pm-Discussed treatment plan of splint, ice and anti-inflammatories with pt and pt agreed to plan. Pt requested a work note which I will provide. He also turned down pain medications.   Labs Reviewed - No data to display Dg Hand Complete Right  02/01/2012  *RADIOLOGY REPORT*  Clinical Data: Hand injury  RIGHT HAND - COMPLETE 3+ VIEW  Comparison: None  Findings: There is no evidence of fracture or dislocation.  There is no evidence of arthropathy or other focal bone abnormality. Soft  tissues are unremarkable.  IMPRESSION: Negative examination.  Original Report Authenticated By: Rosealee Albee, M.D.     No diagnosis found.    MDM      I personally performed the services described in this documentation, which was scribed in my presence. The recorded information has been reviewed and considered.    Hilario Quarry, MD 02/01/12 [phone]

## 2012-02-01 NOTE — Discharge Instructions (Signed)
ExitCare Patient Information 2012 [place], [place].Contusion (Bruise) of Hand An injury to the hand may cause bruises (contusions). Contusions are caused by bleeding from small blood vessels (capillaries) that allow blood to leak out into the muscles, tendons, and surrounding soft tissue. This is followed by swelling and pain (inflammation). Contusions of the hand are common because of the use of hands in daily and recreational activities. Signs of a hand injury include pain, swelling, and a color change. Initially the skin may turn blue to purple in color. As the bruise ages, the color turns yellow and orange. Swelling may decrease the movement of the fingers. Contusions are seen more commonly with:  Contact sports (especially in football, wrestling, and basketball).   Use of medications that thin the blood (anticoagulants).   Use of aspirin and nonsteroidal anti-inflammatory agents that decrease the ability of the blood to clot.   Vitamin deficiencies.   Aging.  DIAGNOSIS  Diagnosis of hand injuries can be made by your own observation. If problems continue, a caregiver may be required for further evaluation and treatment. X-rays may be required to make sure there are no broken bones (fractures). Continued problems may require physical therapy for treatment. RISKS AND COMPLICATIONS  Extensive bleeding and tissue inflammation. This can lead to disability and arthritis-type problems later on if the hand does not heal properly.   Infection of the hand if there are breaks in the skin. This is especially true if the hand injury came from someone's teeth, such as would occur with punching someone in the mouth. This can lead to an infection of the tendons and the membranes surrounding the tendons (sheaths). This infection can have severe complications including a loss of function (a "frozen" hand).   Rupture of the tendons requiring a surgical repair. Failure to repair the tendons can result in loss  of function of the hand or fingers.  HOME CARE INSTRUCTIONS   Apply ice to the injury for 15 to 20 minutes, 3 to 4 times per day. Put the ice in a plastic bag and place a towel between the bag of ice and your skin.   An elastic bandage may be used initially for support and to minimize swelling. Do not wrap the hand too tightly. Do not sleep with the elastic bandage on.   Gentle massage from the fingertips towards the elbow will help keep the swelling down. Gently open and close your fist while doing this to maintain range of motion. Do this only after the first few days, when there is no or minimal pain.   Keep your hand above the level of the heart when swelling and pain are present. This will allow the fluid to drain out of the hand, decreasing the amount of swelling. This will improve healing time.   Try to avoid use of the injured hand (except for gentle range of motion) while the hand is hurting. Do not resume use until instructed by your caregiver. Then begin use gradually, do not increase use to the point of pain. If pain does develop, decrease use and continue the above measures, gradually increasing activities that do not cause discomfort until you achieve normal use.   Only take over-the-counter or prescription medicines for pain, discomfort, or fever as directed by your caregiver.   Follow up with your caregiver as directed. Follow-up care may include orthopedic referrals, physical therapy, and rehabilitation. Any delay in obtaining necessary care could result in delayed healing, or temporary or permanent disability.  REHABILITATION  Begin daily rehabilitation exercises when an elastic bandage is no longer needed and you are either pain free or only have minimal pain.   Use ice massage for 10 minutes before and after workouts. Put ice in a plastic bag and place a towel between the bag of ice and your skin. Massage the injured area with the ice pack.  SEEK IMMEDIATE MEDICAL CARE IF:     Your pain and swelling increase, or pain is uncontrolled with medications.   You have loss of feeling in your hand, or your hand turns cold or blue.   An oral temperature above 102 F (38.9 C) develops, not controlled by medication.   Your hand becomes warm to the touch, or you have increased pain with even slight movement of your fingers.   Your hand does not begin to improve in 1 or 2 days.   The skin is broken and signs of infection occur (fluid draining from the contusion, increasing pain, fever, headache, muscle aches, dizziness, or a general ill feeling).   You develop new, unexplained problems, or an increase of the symptoms that brought you to your caregiver.  MAKE SURE YOU:   Understand these instructions.   Will watch your condition.   Will get help right away if you are not doing well or get worse.  Document Released: 01/10/2002 Document Revised: 07/10/2011 Document Reviewed: 12/28/2009 [facility] Patient Information 2012 [place], [place].

## 2012-02-01 NOTE — ED Notes (Addendum)
Pt states his right hand got caught between the window and frame of a [employer] 747 earlier this a.m. C/O pain to right hand. Swelling noted. WC. Needs drug screen.

## 2012-06-07 ENCOUNTER — Ambulatory Visit
Admission: RE | Admit: 2012-06-07 | Discharge: 2012-06-07 | Disposition: A | Discharge status: Home / Self Care | Payer: Managed Care, Other (non HMO) | Source: Ambulatory Visit | Attending: Cardiovascular Disease | Admitting: Cardiovascular Disease

## 2012-06-07 ENCOUNTER — Other Ambulatory Visit: Payer: Self-pay | Admitting: Cardiovascular Disease

## 2012-06-07 DIAGNOSIS — R0602 Shortness of breath: Secondary | ICD-10-CM

## 2012-06-08 ENCOUNTER — Other Ambulatory Visit: Payer: Self-pay | Admitting: Cardiovascular Disease

## 2012-06-11 ENCOUNTER — Encounter (HOSPITAL_COMMUNITY)
Admission: RE | Disposition: A | Discharge status: Home / Self Care | Payer: Self-pay | Source: Ambulatory Visit | Attending: Cardiovascular Disease

## 2012-06-11 ENCOUNTER — Ambulatory Visit (HOSPITAL_COMMUNITY)
Admission: RE | Admit: 2012-06-11 | Discharge: 2012-06-11 | Disposition: A | Discharge status: Home / Self Care | Payer: Managed Care, Other (non HMO) | Source: Ambulatory Visit | Attending: Cardiovascular Disease | Admitting: Cardiovascular Disease

## 2012-06-11 DIAGNOSIS — I200 Unstable angina: Secondary | ICD-10-CM | POA: Insufficient documentation

## 2012-06-11 DIAGNOSIS — T82897A Other specified complication of cardiac prosthetic devices, implants and grafts, initial encounter: Secondary | ICD-10-CM | POA: Insufficient documentation

## 2012-06-11 DIAGNOSIS — Z9861 Coronary angioplasty status: Secondary | ICD-10-CM | POA: Insufficient documentation

## 2012-06-11 DIAGNOSIS — I252 Old myocardial infarction: Secondary | ICD-10-CM | POA: Insufficient documentation

## 2012-06-11 DIAGNOSIS — I2510 Atherosclerotic heart disease of native coronary artery without angina pectoris: Secondary | ICD-10-CM | POA: Insufficient documentation

## 2012-06-11 DIAGNOSIS — Y831 Surgical operation with implant of artificial internal device as the cause of abnormal reaction of the patient, or of later complication, without mention of misadventure at the time of the procedure: Secondary | ICD-10-CM | POA: Insufficient documentation

## 2012-06-11 HISTORY — PX: LEFT HEART CATHETERIZATION WITH CORONARY ANGIOGRAM: SHX5451

## 2012-06-11 SURGERY — LEFT HEART CATHETERIZATION WITH CORONARY ANGIOGRAM
Anesthesia: LOCAL

## 2012-06-11 MED ORDER — MIDAZOLAM HCL 2 MG/2ML IJ SOLN
INTRAMUSCULAR | Status: AC
  Filled 2012-06-11: qty 2

## 2012-06-11 MED ORDER — LIDOCAINE HCL (PF) 1 % IJ SOLN
INTRAMUSCULAR | Status: AC
  Filled 2012-06-11: qty 30

## 2012-06-11 MED ORDER — NITROGLYCERIN 0.2 MG/ML ON CALL CATH LAB
INTRAVENOUS | Status: AC
  Filled 2012-06-11: qty 1

## 2012-06-11 MED ORDER — SODIUM CHLORIDE 0.9 % IV SOLN: 1.0000 mL/kg/h | INTRAVENOUS | Status: DC

## 2012-06-11 MED ORDER — ACETAMINOPHEN 325 MG PO TABS: 650.0000 mg | ORAL_TABLET | ORAL | Status: DC | PRN

## 2012-06-11 MED ORDER — FENTANYL CITRATE 0.05 MG/ML IJ SOLN
INTRAMUSCULAR | Status: AC
  Filled 2012-06-11: qty 2

## 2012-06-11 MED ORDER — SODIUM CHLORIDE 0.9 % IJ SOLN: 3.0000 mL | INTRAMUSCULAR | Status: DC | PRN

## 2012-06-11 MED ORDER — ASPIRIN 81 MG PO CHEW
324.0000 mg | CHEWABLE_TABLET | ORAL | Status: AC
  Administered 2012-06-11: 324 mg via ORAL
  Filled 2012-06-11: qty 4

## 2012-06-11 MED ORDER — HEPARIN (PORCINE) IN NACL 2-0.9 UNIT/ML-% IJ SOLN
INTRAMUSCULAR | Status: AC
  Filled 2012-06-11: qty 1500

## 2012-06-11 MED ORDER — ONDANSETRON HCL 4 MG/2ML IJ SOLN: 4.0000 mg | Freq: Four times a day (QID) | INTRAMUSCULAR | Status: DC | PRN

## 2012-06-11 MED ORDER — SODIUM CHLORIDE 0.9 % IV SOLN
INTRAVENOUS | Status: DC
  Administered 2012-06-11: 12:00:00 via INTRAVENOUS

## 2012-06-11 NOTE — H&P (Signed)
Date of Initial H&P: 06/07/2012  History reviewed, patient examined, no change in status, stable for surgery. Coronary angiography to assess for in stent restenosis. This procedure has been fully reviewed with the patient and written informed consent has been obtained.  Thurmon Fair, MD, [facility] [phone] office [phone] pager 06/11/2012

## 2012-06-15 ENCOUNTER — Other Ambulatory Visit (HOSPITAL_COMMUNITY): Payer: Self-pay | Admitting: Cardiovascular Disease

## 2012-06-15 DIAGNOSIS — I2510 Atherosclerotic heart disease of native coronary artery without angina pectoris: Secondary | ICD-10-CM

## 2012-06-15 DIAGNOSIS — R079 Chest pain, unspecified: Secondary | ICD-10-CM

## 2012-06-18 NOTE — CV Procedure (Signed)
Dylan [name], Dylan [name], 46 y.o., 1965/12/30   MRN: [id number]  CSN: [id number] Admit Dt: 06/11/2012    CARDIAC CATHETERIZATION REPORT   Procedures performed:  1. Left heart catheterization  2. Selective coronary angiography  3. 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  Consent: The risks, benefits, and details of the procedure were explained to the patient. Risks including death, MI, stroke, bleeding, limb ischemia, renal failure and allergy were described and accepted by the patient. Informed written consent was obtained prior to proceeding.  Technique: The patient was brought to the cardiac catheterization laboratory in the fasting state. He was prepped and draped in the usual sterile fashion. Local anesthesia with 1% lidocaine was administered to the right groin area. Using the modified Seldinger technique a 5 French right common femoral artery sheath was introduced without difficulty. Under fluoroscopic guidance, using 5 [place] JL4, JR and angled pigtail catheters, selective cannulation of the left coronary artery, right coronary artery and left ventricle were respectively performed. Several coronary angiograms in a variety of projections were recorded, as well as a left ventriculogram in the RAO projection. Left ventricular pressure and a pull back to the aorta were recorded. No immediate complications occurred. At the end of the procedure, all catheters were removed. After the procedure, hemostasis will be achieved with manual pressure.  Contrast used: 80 mL Omnipaque  Angiographic Findings:  1. The left main coronary artery is free of significant atherosclerosis and bifurcates in  the usual fashion into the left anterior descending artery and left circumflex coronary artery.  2. The left anterior descending artery is a large vessel that reaches the apex and generates threemajor diagonal branches. There is evidence of mild luminal irregularities and no calcification. No hemodynamically meaningful stenoses are seen. The first diagonal artery is very large and serves as a ramus intermedius artery that supplies most of the lateral wall. 3. The left circumflex coronary artery is a medium-size vessel non dominant vessel that generates a single distal major oblique marginal arteries. There is evidence of mild luminal irregularities and no calcification. No hemodynamically meaningful stenoses are seen. 4. The right coronary artery is a large size dominant vessel that generates a long posterior lateral ventricular system as well as the PDA. There is evidence of three long stents in the mid and distal vessel. They are widely patent except for a very short segment just before the acute margin where there is a 75% stenosis. No other hemodynamically meaningful stenoses are seen.  5. The left ventricle is normal in size. The left ventricle systolic function is normal with an estimated ejection fraction of 60%. Regional wall motion abnormalities are not seen. No left ventricular thrombus is seen. There is no mitral insufficiency. The ascending aorta appears normal. There is no aortic valve stenosis by pullback. The left ventricular end-diastolic pressure is 11 mm Hg.    IMPRESSIONS:  Moderate to high grade in stent restenosis of the right coronary artery.  RECOMMENDATION:  He tells me that his symptoms have completely resolved and that he would prefer to not undergo any new revascularization. He is concerned this would prevent him from reapplying for his license for another year. He agrees to  undergo revascularization if a nuclear perfusion study shows objective evidence of ischemia in the inferior  wall.Will schedule this study.    Thurmon Fair, MD, [facility] [address] [facility] and [facility] [phone] office [phone] pager

## 2012-06-25 ENCOUNTER — Encounter (HOSPITAL_COMMUNITY): Payer: Managed Care, Other (non HMO)

## 2012-06-30 ENCOUNTER — Encounter (HOSPITAL_COMMUNITY): Payer: Self-pay | Admitting: Pharmacy Technician

## 2012-07-06 ENCOUNTER — Encounter (HOSPITAL_COMMUNITY): Payer: Self-pay | Admitting: *Deleted

## 2012-07-06 ENCOUNTER — Emergency Department (HOSPITAL_COMMUNITY): Payer: Managed Care, Other (non HMO)

## 2012-07-06 ENCOUNTER — Encounter (HOSPITAL_COMMUNITY)
Admission: EM | Disposition: A | Discharge status: Home / Self Care | Payer: Self-pay | Source: Home / Self Care | Attending: Cardiovascular Disease

## 2012-07-06 ENCOUNTER — Inpatient Hospital Stay (HOSPITAL_COMMUNITY)
Admission: EM | Admit: 2012-07-06 | Discharge: 2012-07-07 | DRG: 251 | Disposition: A | Discharge status: Home / Self Care | Payer: Managed Care, Other (non HMO) | Attending: Cardiovascular Disease | Admitting: Cardiovascular Disease

## 2012-07-06 ENCOUNTER — Other Ambulatory Visit: Payer: Self-pay

## 2012-07-06 DIAGNOSIS — Y832 Surgical operation with anastomosis, bypass or graft as the cause of abnormal reaction of the patient, or of later complication, without mention of misadventure at the time of the procedure: Secondary | ICD-10-CM | POA: Diagnosis present

## 2012-07-06 DIAGNOSIS — Z79899 Other long term (current) drug therapy: Secondary | ICD-10-CM

## 2012-07-06 DIAGNOSIS — I10 Essential (primary) hypertension: Secondary | ICD-10-CM | POA: Diagnosis present

## 2012-07-06 DIAGNOSIS — Z885 Allergy status to narcotic agent status: Secondary | ICD-10-CM

## 2012-07-06 DIAGNOSIS — I252 Old myocardial infarction: Secondary | ICD-10-CM

## 2012-07-06 DIAGNOSIS — F17200 Nicotine dependence, unspecified, uncomplicated: Secondary | ICD-10-CM | POA: Diagnosis present

## 2012-07-06 DIAGNOSIS — Z87891 Personal history of nicotine dependence: Secondary | ICD-10-CM

## 2012-07-06 DIAGNOSIS — E785 Hyperlipidemia, unspecified: Secondary | ICD-10-CM | POA: Diagnosis present

## 2012-07-06 DIAGNOSIS — Z7982 Long term (current) use of aspirin: Secondary | ICD-10-CM

## 2012-07-06 DIAGNOSIS — Y92009 Unspecified place in unspecified non-institutional (private) residence as the place of occurrence of the external cause: Secondary | ICD-10-CM

## 2012-07-06 DIAGNOSIS — I2510 Atherosclerotic heart disease of native coronary artery without angina pectoris: Secondary | ICD-10-CM | POA: Diagnosis present

## 2012-07-06 DIAGNOSIS — I69928 Other speech and language deficits following unspecified cerebrovascular disease: Secondary | ICD-10-CM

## 2012-07-06 DIAGNOSIS — Z8673 Personal history of transient ischemic attack (TIA), and cerebral infarction without residual deficits: Secondary | ICD-10-CM

## 2012-07-06 DIAGNOSIS — I200 Unstable angina: Secondary | ICD-10-CM | POA: Diagnosis present

## 2012-07-06 DIAGNOSIS — Z9861 Coronary angioplasty status: Secondary | ICD-10-CM

## 2012-07-06 DIAGNOSIS — T82897A Other specified complication of cardiac prosthetic devices, implants and grafts, initial encounter: Principal | ICD-10-CM | POA: Diagnosis present

## 2012-07-06 DIAGNOSIS — Z8249 Family history of ischemic heart disease and other diseases of the circulatory system: Secondary | ICD-10-CM

## 2012-07-06 HISTORY — DX: Cerebral infarction, unspecified: I63.9

## 2012-07-06 HISTORY — DX: Non-ST elevation (NSTEMI) myocardial infarction: I21.4

## 2012-07-06 HISTORY — PX: PERCUTANEOUS CORONARY STENT INTERVENTION (PCI-S): SHX5485

## 2012-07-06 HISTORY — DX: Migraine, unspecified, not intractable, without status migrainosus: G43.909

## 2012-07-06 LAB — TROPONIN I
Troponin I: <0.3 ng/mL (ref <0.30)
Troponin I: <0.3 ng/mL (ref <0.30)

## 2012-07-06 LAB — BASIC METABOLIC PANEL
BUN: 20 mg/dL (ref 6–23)
CO2: 28 mEq/L (ref 19–32)
Calcium: 9.9 mg/dL (ref 8.4–10.5)
Creatinine, Ser: 0.9 mg/dL (ref 0.50–1.35)
Glucose, Bld: 125 mg/dL — ABNORMAL HIGH (ref 70–99)

## 2012-07-06 LAB — CBC
Hemoglobin: 15.7 g/dL (ref 13.0–17.0)
MCH: 29.8 pg (ref 26.0–34.0)
MCV: 87.5 fL (ref 78.0–100.0)
RBC: 5.27 MIL/uL (ref 4.22–5.81)

## 2012-07-06 LAB — POCT ACTIVATED CLOTTING TIME: Activated Clotting Time: 389 seconds

## 2012-07-06 LAB — HEPATIC FUNCTION PANEL
ALT: 24 U/L (ref 0–53)
AST: 21 U/L (ref 0–37)
Total Protein: 7.4 g/dL (ref 6.0–8.3)

## 2012-07-06 LAB — PROTIME-INR: INR: 0.96 (ref 0.00–1.49)

## 2012-07-06 LAB — MRSA PCR SCREENING: MRSA by PCR: NEGATIVE

## 2012-07-06 SURGERY — PERCUTANEOUS CORONARY STENT INTERVENTION (PCI-S)
Anesthesia: LOCAL

## 2012-07-06 MED ORDER — ASPIRIN 81 MG PO CHEW
81.0000 mg | CHEWABLE_TABLET | Freq: Every day | ORAL | Status: DC
  Administered 2012-07-07: 81 mg via ORAL
  Filled 2012-07-06: qty 1

## 2012-07-06 MED ORDER — MIDAZOLAM HCL 2 MG/2ML IJ SOLN
INTRAMUSCULAR | Status: AC
  Filled 2012-07-06: qty 2

## 2012-07-06 MED ORDER — ONDANSETRON HCL 4 MG/2ML IJ SOLN: 4.0000 mg | Freq: Four times a day (QID) | INTRAMUSCULAR | Status: DC | PRN

## 2012-07-06 MED ORDER — ZOLPIDEM TARTRATE 5 MG PO TABS
5.0000 mg | ORAL_TABLET | Freq: Every evening | ORAL | Status: DC | PRN
  Administered 2012-07-06: 5 mg via ORAL
  Filled 2012-07-06: qty 1

## 2012-07-06 MED ORDER — HEPARIN (PORCINE) IN NACL 100-0.45 UNIT/ML-% IJ SOLN
1500.0000 [IU]/h | INTRAMUSCULAR | Status: DC
  Administered 2012-07-06: 1500 [IU]/h via INTRAVENOUS
  Filled 2012-07-06 (×2): qty 250

## 2012-07-06 MED ORDER — SODIUM CHLORIDE 0.9 % IV SOLN
0.2500 mg/kg/h | INTRAVENOUS | Status: AC
  Administered 2012-07-06: 0.25 mg/kg/h via INTRAVENOUS
  Filled 2012-07-06: qty 250

## 2012-07-06 MED ORDER — SODIUM CHLORIDE 0.9 % IV BOLUS (SEPSIS)
1000.0000 mL | Freq: Once | INTRAVENOUS | Status: AC
  Administered 2012-07-06: 1000 mL via INTRAVENOUS

## 2012-07-06 MED ORDER — NITROGLYCERIN 0.4 MG SL SUBL: 0.4000 mg | SUBLINGUAL_TABLET | SUBLINGUAL | Status: DC | PRN

## 2012-07-06 MED ORDER — IRBESARTAN 150 MG PO TABS
150.0000 mg | ORAL_TABLET | Freq: Every day | ORAL | Status: DC
  Administered 2012-07-07: 150 mg via ORAL
  Filled 2012-07-06: qty 1

## 2012-07-06 MED ORDER — ASPIRIN EC 81 MG PO TBEC: 81.0000 mg | DELAYED_RELEASE_TABLET | Freq: Every day | ORAL | Status: DC

## 2012-07-06 MED ORDER — LIDOCAINE HCL (PF) 1 % IJ SOLN
INTRAMUSCULAR | Status: AC
  Filled 2012-07-06: qty 30

## 2012-07-06 MED ORDER — TICAGRELOR 90 MG PO TABS
ORAL_TABLET | ORAL | Status: AC
  Administered 2012-07-06: 90 mg via ORAL
  Filled 2012-07-06: qty 2

## 2012-07-06 MED ORDER — HEPARIN BOLUS VIA INFUSION
4000.0000 [IU] | Freq: Once | INTRAVENOUS | Status: AC
  Administered 2012-07-06: 4000 [IU] via INTRAVENOUS

## 2012-07-06 MED ORDER — HEPARIN (PORCINE) IN NACL 2-0.9 UNIT/ML-% IJ SOLN
INTRAMUSCULAR | Status: AC
  Filled 2012-07-06: qty 1500

## 2012-07-06 MED ORDER — ALPRAZOLAM 0.25 MG PO TABS: 0.2500 mg | ORAL_TABLET | Freq: Two times a day (BID) | ORAL | Status: DC | PRN

## 2012-07-06 MED ORDER — FENTANYL CITRATE 0.05 MG/ML IJ SOLN
INTRAMUSCULAR | Status: AC
  Filled 2012-07-06: qty 2

## 2012-07-06 MED ORDER — ASPIRIN 81 MG PO CHEW: 324.0000 mg | CHEWABLE_TABLET | ORAL | Status: DC

## 2012-07-06 MED ORDER — SIMVASTATIN 20 MG PO TABS
20.0000 mg | ORAL_TABLET | Freq: Every day | ORAL | Status: DC
  Administered 2012-07-06: 21:00:00 20 mg via ORAL
  Filled 2012-07-06 (×3): qty 1

## 2012-07-06 MED ORDER — DIAZEPAM 5 MG PO TABS: 5.0000 mg | ORAL_TABLET | ORAL | Status: DC

## 2012-07-06 MED ORDER — SODIUM CHLORIDE 0.9 % IV SOLN
INTRAVENOUS | Status: AC
  Administered 2012-07-06: 17:00:00 via INTRAVENOUS

## 2012-07-06 MED ORDER — ACETAMINOPHEN 325 MG PO TABS: 650.0000 mg | ORAL_TABLET | ORAL | Status: DC | PRN

## 2012-07-06 MED ORDER — NITROGLYCERIN IN D5W 200-5 MCG/ML-% IV SOLN
5.0000 ug/min | INTRAVENOUS | Status: DC
  Administered 2012-07-06: 5 ug/min via INTRAVENOUS
  Filled 2012-07-06: qty 250

## 2012-07-06 MED ORDER — SODIUM CHLORIDE 0.9 % IJ SOLN: 3.0000 mL | INTRAMUSCULAR | Status: DC | PRN

## 2012-07-06 MED ORDER — SODIUM CHLORIDE 0.9 % IJ SOLN: 3.0000 mL | Freq: Two times a day (BID) | INTRAMUSCULAR | Status: DC

## 2012-07-06 MED ORDER — SODIUM CHLORIDE 0.9 % IV SOLN
INTRAVENOUS | Status: DC
  Administered 2012-07-06: 14:00:00 via INTRAVENOUS

## 2012-07-06 MED ORDER — MORPHINE SULFATE 2 MG/ML IJ SOLN
2.0000 mg | INTRAMUSCULAR | Status: DC | PRN
  Administered 2012-07-06: 4 mg via INTRAVENOUS
  Filled 2012-07-06: qty 2

## 2012-07-06 MED ORDER — ASPIRIN 300 MG RE SUPP: 300.0000 mg | RECTAL | Status: DC

## 2012-07-06 MED ORDER — TICAGRELOR 90 MG PO TABS
90.0000 mg | ORAL_TABLET | Freq: Two times a day (BID) | ORAL | Status: DC
  Administered 2012-07-06 – 2012-07-07 (×2): 90 mg via ORAL
  Filled 2012-07-06 (×3): qty 1

## 2012-07-06 MED ORDER — SODIUM CHLORIDE 0.9 % IV SOLN
250.0000 mL | INTRAVENOUS | Status: DC
  Administered 2012-07-06: 250 mL via INTRAVENOUS

## 2012-07-06 MED ORDER — BIVALIRUDIN 250 MG IV SOLR
INTRAVENOUS | Status: AC
  Filled 2012-07-06: qty 250

## 2012-07-06 MED ORDER — NITROGLYCERIN 0.2 MG/ML ON CALL CATH LAB
INTRAVENOUS | Status: AC
  Filled 2012-07-06: qty 1

## 2012-07-06 MED ORDER — MORPHINE SULFATE 4 MG/ML IJ SOLN
4.0000 mg | Freq: Once | INTRAMUSCULAR | Status: AC
  Administered 2012-07-06: 4 mg via INTRAVENOUS
  Filled 2012-07-06: qty 1

## 2012-07-06 MED ORDER — ASPIRIN 81 MG PO CHEW
324.0000 mg | CHEWABLE_TABLET | ORAL | Status: DC
  Filled 2012-07-06: qty 4

## 2012-07-06 MED ORDER — METOPROLOL SUCCINATE ER 50 MG PO TB24
50.0000 mg | ORAL_TABLET | Freq: Every day | ORAL | Status: DC
  Administered 2012-07-07: 10:00:00 50 mg via ORAL
  Filled 2012-07-06: qty 1

## 2012-07-06 NOTE — H&P (Signed)
Dylan [name] is an 46 y.o. male.    Cardiologist: Dr. Royann Shivers  Chief Complaint: chest pain, progressive in nature HPI: 46 year old white married male with premature coronary artery disease who presented in 2011 with a non-ST elevation MI due to total occlusion of the RCA. He had ION drug-eluting stents placed. Post that cardiac cath he developed a tiny stroke affecting his speech which has resolved almost completely except he  occasionally has stuttering. This was done quite well since 2011 and was planning to obtain his pilot wife. 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 It was felt due to his body habitus cardiac cath would be best option to determine his coronary disease.  06/11/2012 he underwent cardiac catheterization and was found to have in-stent restenosis of the RCA, EF was found to be 60% plans were for an outpatient nuc stress test to determine ischemia but since that time patient has developed increasing episodes of angina.  We had already arranged for PCI of the RCA next week.    Yesterday the patient had a significant episode of chest pain while at work. He was climbing steps frequently and developed midsternal chest pain with radiation into his jaw. He was also short of breath, he had no diaphoresis or nausea at that time.   Pain improved and again this morning at rest this time he had significant chest pain rated 8/10.  He came to the emergency room EKG without acute ST changes but continues with chest pain.  IV morphine has been given, IV heparin and IV nitroglycerin as well.  Pain is now down to 2/10.  He has not eaten today- will keep NPO for possible cardiac cath today.  Past Medical History  Diagnosis Date  . Hypertension   . Coronary artery disease   . Hx of non-ST elevation myocardial infarction (NSTEMI), 2011 07/06/2012  . H/O: CVA (cerebrovascular accident), post cardiac cath, minimal speech residual 07/06/2012     Past Surgical History  Procedure Date  . Coronary stent placement   . Septoplasty   . Coronary angioplasty     Family History  Problem Relation Age of Onset  . Atrial fibrillation Mother   . Mitral valve prolapse Mother   . Coronary artery disease Father    Social History:  reports that he has quit smoking. His smokeless tobacco use includes Chew. He reports that he drinks about .6 ounces of alcohol per week. He reports that he does not use illicit drugs.  Allergies:  Allergies  Allergen Reactions  . Codeine     itching    Out Patient Medications: Aspirin 325 mg daily Metoprolol succinate 50 mg daily Benicar 20 mg daily Hydrochlorothiazide 25 mg daily Just started on pravastatin 40 mg daily  Patient had been on Lipitor but stopped it significant myalgias he also had difficulty financially with Crestor    Results for orders placed during the hospital encounter of 07/06/12 (from the past 48 hour(s))  CBC     Status: Normal   Collection Time   07/06/12  9:59 AM      Component Value Range Comment   WBC 7.5  4.0 - 10.5 K/uL    RBC 5.27  4.22 - 5.81 MIL/uL    Hemoglobin 15.7  13.0 - 17.0 g/dL    HCT 16.1  09.6 - 04.5 %    MCV 87.5  78.0 - 100.0 fL    MCH 29.8  26.0 -  34.0 pg    MCHC 34.1  30.0 - 36.0 g/dL    RDW 16.1  09.6 - 04.5 %    Platelets 249  150 - 400 K/uL   BASIC METABOLIC PANEL     Status: Abnormal   Collection Time   07/06/12  9:59 AM      Component Value Range Comment   Sodium 136  135 - 145 mEq/L    Potassium 4.3  3.5 - 5.1 mEq/L    Chloride 97  96 - 112 mEq/L    CO2 28  19 - 32 mEq/L    Glucose, Bld 125 (*) 70 - 99 mg/dL    BUN 20  6 - 23 mg/dL    Creatinine, Ser 4.09  0.50 - 1.35 mg/dL    Calcium 9.9  8.4 - 81.1 mg/dL    GFR calc non Af Amer >90  >90 mL/min    GFR calc Af Amer >90  >90 mL/min    Dg Chest 2 View  07/06/2012  *RADIOLOGY REPORT*  Clinical Data: Chest pain, shortness of breath.  CHEST - 2 VIEW  Comparison: 06/07/2012  Findings:  Heart and mediastinal contours are within normal limits. No focal opacities or effusions.  No acute bony abnormality.  Left cardiac stent noted.  IMPRESSION: No active cardiopulmonary disease.   Original Report Authenticated By: Charlett Nose, M.D.     ROS: General:no colds or fevers, no weight changes Skin:no rashes or ulcers HEENT:no blurred vision, no congestion CV:see HPI PUL:see HPI GI:no diarrhea constipation or melena, no indigestion GU:no hematuria, no dysuria MS:no joint pain, no claudication Neuro:no syncope, no lightheadedness Endo:no diabetes, no thyroid disease   Blood pressure 128/72, pulse 88, temperature 97.6 F (36.4 C), temperature source Oral, resp. rate 18, SpO2 95.00%. PE: General: Alert and oriented, pleasant affect, continues with chest pain. Skin: Warm and dry, brisk capillary refill HEENT: Normocephalic, sclera clear Neck: Supple no JVD no carotid bruits Heart: S1-S2 regular rate and rhythm without murmur gallop or click Lungs: Clear without rales rhonchi or wheezes  Abd: Soft nontender do not palpate liver spleen or masses positive bowel sounds Ext: No edema 2+ pedal pulses bilaterally Neuro: Alert and oriented x3 follows commands moves all extremities    Assessment/Plan Principal Problem:  *Unstable angina, cresendo  Active Problems:  CAD (coronary artery disease), previous stents to RCA X 3 ION DES 07/01/2010, now with progressive in stent restenosis by cath 06/2012  Hx of non-ST elevation myocardial infarction (NSTEMI), 2011  H/O: CVA (cerebrovascular accident), post cardiac cath, minimal speech residual  PLAN: IV heparin, IV nitroglycerin, serial cardiac enzymes. We will keep n.p.o. for possible PCI today.    INGOLD,LAURA R 07/06/2012, 11:29 AM   I have seen and examined the patient along with [facility] R, NP.  I have reviewed the chart, notes and new data.  I agree with NP's note.  Unstable angina in a patient with known in-stent restenosis.  Mild discomfort persists on IV NTG, but no high risk ECG changes or enzyme spill. PCI was already planned, but will try to do as soon as schedule allows, hopefully today.  Thurmon Fair, MD, [facility] [phone] 07/06/2012, 2:07 PM

## 2012-07-06 NOTE — ED Notes (Signed)
Pt in xray

## 2012-07-06 NOTE — Op Note (Signed)
Dylan [name] is a 46 y.o. male    [id number] LOCATION:  FACILITY: [facility]  PHYSICIAN: Nanetta Batty, M.D. 06/15/1966   DATE OF PROCEDURE:  07/06/2012  DATE OF DISCHARGE:  [facility]  CARDIAC CATHETERIZATION     History obtained from chart review.46 year old white married male with premature coronary artery disease who presented in 2011 with a non-ST elevation MI due to total occlusion of the RCA. He had ION drug-eluting stents placed. Post that cardiac cath he developed a tiny stroke affecting his speech which has resolved almost completely except he occasionally has stuttering. This was done quite well since 2011 and was planning to obtain his pilot wife. 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 It was felt due to his body habitus cardiac cath would be best option to determine his coronary disease. 06/11/2012 he underwent cardiac catheterization and was found to have in-stent restenosis of the RCA, EF was found to be 60% plans were for an outpatient nuc stress test to determine ischemia but since that time patient has developed increasing episodes of angina. We had already arranged for PCI of the RCA next week.  Yesterday the patient had a significant episode of chest pain while at work. He was climbing steps frequently and developed midsternal chest pain with radiation into his jaw. He was also short of breath, he had no diaphoresis or nausea at that time. Pain improved and again this morning at rest this time he had significant chest pain rated 8/10. He came to the emergency room EKG without acute ST changes but continues with chest pain. IV morphine has been given, IV heparin and IV nitroglycerin as well. Pain is now down to 2/10.  He has not eaten today- will keep NPO for possible cardiac cath today.    PROCEDURE DESCRIPTION:    The patient was brought to the second floor  [facility]  lab in the postabsorptive state. He was  premedicated with Valium 5 mg by mouth, IV Versed and fentanyl.Marland Kitchen His right groin was prepped and shaved in usual sterile fashion. Xylocaine 1% was used  for local anesthesia. A 6 French sheath was inserted into the right common femoral  artery using standard Seldinger technique.    HEMODYNAMICS:    AO SYSTOLIC/AO DIASTOLIC: 113/75   ANGIOGRAPHIC RESULTS:   The patient had already received his by mouth aspirin, and received Brilenta 180 mg. Received Angiomax bolus and ACT of 389. Total 70 cc of contrast was administered to the patient. Using a 6 [place] JR 4 guide catheter along with an 014 190 cm length [employer] ProWater  guidewire and a 3.0 x 10 mm long angiosculpt  balloon intervention was performed. Inflation was performed at 12 atmospheres for 1 minute (3.2 mm) resulting reduction of 80% hypodense mid RCA "in-stent restenosis" to less than 10% residual stenosis. The patient tolerated the procedure well without electrocardiographic or hemodynamic sequela.   IMPRESSION:successful angiosculpt  PCI of symptomatic high-grade mid dominant RCA "in-stent" restenosis with an excellent angiographic result. The patient will be treated with aspirin and Brilenta , The sheath will be removed in approximately 3 hours and pressure will be held on the groin to achieve hemostasis. The patient left the Cath Lab in stable condition. He was discharged home tomorrow.  Runell Gess MD, [facility] 07/06/2012 5:05 PM

## 2012-07-06 NOTE — ED Notes (Signed)
IV starts attempted by this RN and 2 others. IV team to see pt.

## 2012-07-06 NOTE — Progress Notes (Signed)
ANTICOAGULATION CONSULT NOTE - Initial Consult  Pharmacy Consult for  Heparin Indication:  Chest Pain  No Known Allergies  Patient Measurements: Ht Readings from Last 3 Encounters:  06/11/12 6\' 2"  (1.88 m)  06/11/12 6\' 2"  (1.88 m)  02/01/12 6\' 1"  (1.854 m)    Wt Readings from Last 3 Encounters:  06/11/12 260 lb (117.935 kg)  06/11/12 260 lb (117.935 kg)  02/01/12 250 lb (113.399 kg)    Heparin Dosing Weight:  ~ 105 kg   Viital Signs: Temp: 97.6 F (36.4 C) (12/03 0928) Temp src: Oral (12/03 0928) BP: 128/72 mmHg (12/03 0928) Pulse Rate: 88  (12/03 0928)  Labs:  Basename 07/06/12 0959  HGB 15.7  HCT 46.1  PLT 249  APTT --  LABPROT --  INR --  HEPARINUNFRC --  CREATININE --  CKTOTAL --  CKMB --  TROPONINI --    Medical History: Past Medical History  Diagnosis Date  . Hypertension   . Coronary artery disease    Assessment: 46yo male admitted with c/o chest pain.  He has a known history of CAD, with stenting 2 years ago.  He was scheduled for an elective PCI later this month but now is admitted for chest pain.  He has had no recent noted bleeding complications.  We have been asked to start IV heparin for anticoagulation prior to his procedure.  His CBC is normal this morning as is his platelets.  Goal of Therapy:  Heparin level 0.3-0.7 units/ml Monitor platelets by anticoagulation protocol: Yes   Plan:   Begin IV heparin with a 4000 unit bolus  Begin IV heparin infusion at 1500 units/hr  Obtain a heparin level 6 hours after starting therapy  Daily heparin level/CBC  Nadara Mustard, PharmD., MS Clinical Pharmacist Pager:  [phone] Thank you for allowing pharmacy to be part of this patients care team. 07/06/2012,10:45 AM

## 2012-07-06 NOTE — ED Provider Notes (Addendum)
History     CSN: [id number]  Arrival date & time 07/06/12  7829   First MD Initiated Contact with Patient 07/06/12 0940      Chief Complaint  Patient presents with  . Chest Pain    (Consider location/radiation/quality/duration/timing/severity/associated sxs/prior treatment) HPI Comments: Pt with known CAD, hx of RCA stenting 2 years ago, and a recent cath showing restenosis - scheduled for elective PCI comes in with cc of chest pain. Pt reports having worsening chest discomfort, pressure like feeling over the pasty few days. The chest discomfort is exertional, and is staying longer than usual and sometimes pressure. Having some DIB, no n/v/f/c. Denies any illicits. Taking his meds as prescribed and took his ASA today.  Patient is a 46 y.o. male presenting with chest pain. The history is provided by the patient and medical records.  Chest Pain Primary symptoms include shortness of breath. Pertinent negatives for primary symptoms include no fever, no fatigue, no cough, no wheezing, no palpitations and no dizziness.     Past Medical History  Diagnosis Date  . Hypertension   . Coronary artery disease     Past Surgical History  Procedure Date  . Coronary stent placement   . Septoplasty     No family history on file.  History  Substance Use Topics  . Smoking status: Former Games developer  . Smokeless tobacco: Never Used  . Alcohol Use: Yes      Review of Systems  Constitutional: Positive for activity change. Negative for fever, chills and fatigue.  HENT: Negative for neck pain.   Eyes: Negative for visual disturbance.  Respiratory: Positive for shortness of breath. Negative for cough, chest tightness and wheezing.   Cardiovascular: Positive for chest pain. Negative for palpitations and leg swelling.  Gastrointestinal: Negative for abdominal distention.  Genitourinary: Negative for dysuria, enuresis and difficulty urinating.  Musculoskeletal: Negative for arthralgias.   Neurological: Negative for dizziness, light-headedness and headaches.  Psychiatric/Behavioral: Negative for confusion.    Allergies  Review of patient's allergies indicates no known allergies.  Home Medications   Current Outpatient Rx  Name  Route  Sig  Dispense  Refill  . ASPIRIN 325 MG PO TABS   Oral   Take 325 mg by mouth daily.         Marland Kitchen HYDROCHLOROTHIAZIDE 25 MG PO TABS   Oral   Take 25 mg by mouth daily.         Marland Kitchen METOPROLOL SUCCINATE ER 50 MG PO TB24   Oral   Take 50 mg by mouth daily. Take with or immediately following a meal.         . OLMESARTAN MEDOXOMIL 20 MG PO TABS   Oral   Take 20 mg by mouth daily.           BP 128/72  Pulse 88  Temp 97.6 F (36.4 C) (Oral)  Resp 18  SpO2 95%  Physical Exam  Nursing note and vitals reviewed. Constitutional: He is oriented to person, place, and time. He appears well-developed.  HENT:  Head: Normocephalic and atraumatic.  Eyes: Conjunctivae normal and EOM are normal. Pupils are equal, round, and reactive to light.  Neck: Normal range of motion. Neck supple.  Cardiovascular: Normal rate, regular rhythm and normal heart sounds.   Pulmonary/Chest: Effort normal and breath sounds normal. No respiratory distress. He has no wheezes.  Abdominal: Soft. Bowel sounds are normal. He exhibits no distension. There is no tenderness. There is no rebound and no guarding.  Neurological: He is alert and oriented to person, place, and time.  Skin: Skin is warm.    ED Course  Procedures (including critical care time)   Labs Reviewed  CBC  BASIC METABOLIC PANEL   Dg Chest 2 View  07/06/2012  *RADIOLOGY REPORT*  Clinical Data: Chest pain, shortness of breath.  CHEST - 2 VIEW  Comparison: 06/07/2012  Findings: Heart and mediastinal contours are within normal limits. No focal opacities or effusions.  No acute bony abnormality.  Left cardiac stent noted.  IMPRESSION: No active cardiopulmonary disease.   Original Report  Authenticated By: Charlett Nose, M.D.      No diagnosis found.    MDM   Date: 07/06/2012  Rate: 83  Rhythm: normal sinus rhythm  QRS Axis: normal  Intervals: normal  ST/T Wave abnormalities: nonspecific ST changes - mild ST elevation inferior leads  Conduction Disutrbances:none  Narrative Interpretation:   Old EKG Reviewed: none available   Differential diagnosis includes: ACS syndrome CHF exacerbation Valvular disorder Myocarditis Pericarditis Pericardial effusion Pulmonary edema PE  Pt comes in with cc of chest pain. Has chest pain at rest at this time, known CAD, known existing restenosis of RCA. Will call Cardiology immediately, as patient is unstable angina for sure, and possibly NSTEMI. EKG showing some non specific ST changes. Will start heparin. No nitro due to RCA stenosis, but will give morphine. Pt already took his ASA full dose.  Derwood Kaplan, MD 07/06/12 1032  10:42 AM Cardiology team notified - they will see the patient soon. Hemodynamically stable  CRITICAL CARE Performed by: Derwood Kaplan   Total critical care time: 30 minutes  Critical care time was exclusive of separately billable procedures and treating other patients.  Critical care was necessary to treat or prevent imminent or life-threatening deterioration.  Critical care was time spent personally by me on the following activities: development of treatment plan with patient and/or surrogate as well as nursing, discussions with consultants, evaluation of patient's response to treatment, examination of patient, obtaining history from patient or surrogate, ordering and performing treatments and interventions, ordering and review of laboratory studies, ordering and review of radiographic studies, pulse oximetry and re-evaluation of patient's condition.   Derwood Kaplan, MD 07/06/12 1043

## 2012-07-06 NOTE — ED Notes (Signed)
Pt has 3 stents and is scheduled to have 90% blockage fix on the 11th next week by [facility].  Pt with chest pain with radiation to jaw.    Pain 5/10 now

## 2012-07-06 NOTE — Progress Notes (Signed)
Utilization review completed.  P.J. Tara Rud,RN,BSN Case Manager [phone]  

## 2012-07-06 NOTE — Progress Notes (Signed)
Site area: right groin  Site Prior to Removal:  Level 0  Pressure Applied For 20 MINUTES    Minutes Beginning at 1900  Manual:   yes  Patient Status During Pull:  stable  Post Pull Groin Site:  Level 0  Post Pull Instructions Given:  yes  Post Pull Pulses Present:  yes  Dressing Applied:  yes  Comments:

## 2012-07-06 NOTE — H&P (Signed)
    Pt was reexamined and existing H & P reviewed. No changes found.  Runell Gess, MD [facility] 07/06/2012 4:11 PM

## 2012-07-07 ENCOUNTER — Encounter (HOSPITAL_COMMUNITY): Payer: Self-pay | Admitting: Cardiology

## 2012-07-07 DIAGNOSIS — I10 Essential (primary) hypertension: Secondary | ICD-10-CM

## 2012-07-07 DIAGNOSIS — F17200 Nicotine dependence, unspecified, uncomplicated: Secondary | ICD-10-CM | POA: Diagnosis present

## 2012-07-07 DIAGNOSIS — E785 Hyperlipidemia, unspecified: Secondary | ICD-10-CM | POA: Diagnosis present

## 2012-07-07 HISTORY — DX: Essential (primary) hypertension: I10

## 2012-07-07 LAB — CBC
HCT: 41.9 % (ref 39.0–52.0)
RDW: 12.9 % (ref 11.5–15.5)
WBC: 8.6 10*3/uL (ref 4.0–10.5)

## 2012-07-07 LAB — BASIC METABOLIC PANEL
Chloride: 101 mEq/L (ref 96–112)
Creatinine, Ser: 1.06 mg/dL (ref 0.50–1.35)
GFR calc Af Amer: >90 mL/min (ref >90)
Potassium: 3.9 mEq/L (ref 3.5–5.1)

## 2012-07-07 LAB — TROPONIN I: Troponin I: <0.3 ng/mL (ref <0.30)

## 2012-07-07 LAB — LIPID PANEL
Cholesterol: 221 mg/dL — ABNORMAL HIGH (ref 0–200)
Total CHOL/HDL Ratio: 5.7 RATIO
VLDL: 24 mg/dL (ref 0–40)

## 2012-07-07 MED ORDER — SIMVASTATIN 20 MG PO TABS: 20.0000 mg | ORAL_TABLET | Freq: Every day | ORAL | Status: DC

## 2012-07-07 MED ORDER — TICAGRELOR 90 MG PO TABS: 90.0000 mg | ORAL_TABLET | Freq: Two times a day (BID) | ORAL | Status: DC

## 2012-07-07 MED ORDER — ACETAMINOPHEN 325 MG PO TABS: 650.0000 mg | ORAL_TABLET | ORAL | Status: DC | PRN

## 2012-07-07 MED ORDER — ASPIRIN 81 MG PO CHEW: 81.0000 mg | CHEWABLE_TABLET | Freq: Every day | ORAL | Status: DC

## 2012-07-07 MED ORDER — NITROGLYCERIN 0.4 MG SL SUBL: 0.4000 mg | SUBLINGUAL_TABLET | SUBLINGUAL | Status: DC | PRN

## 2012-07-07 MED FILL — Dextrose Inj 5%: INTRAVENOUS | Qty: 50 | Status: AC

## 2012-07-07 NOTE — Discharge Summary (Signed)
Patient ID: Dylan [name],  MRN: [id number], DOB/AGE: [birth year] 46 y.o.  Admit date: 07/06/2012 Discharge date: 07/07/2012  Primary Care Provider: Raenette Rover PA Primary Cardiologist: Dr Royann Shivers  Discharge Diagnoses Principal Problem:  *Unstable angina, cresendo  Active Problems:  S/P angioplasty with stent (angiosculpt) of RCA for "in stent restenosis" 07/06/12  CAD, RCA X 3 ION DES 07/01/2010, ISR -06/23/12   Dyslipidemia, statin intol  Hx of non-ST elevation myocardial infarction (NSTEMI), 2011  H/O: CVA (cerebrovascular accident), post cardiac cath, minimal speech residual 11/11 post PCI  HTN (hypertension)  Smoker, quit 06/30/10    Procedures: POBA for ISR 07/06/12   Hospital Course: 46 year old white married male with premature coronary artery disease who presented in 2011 with a non-ST elevation MI due to total occlusion of the RCA. He had ION drug-eluting stents placed. Post that cardiac cath he developed a tiny stroke affecting his speech which has resolved almost completely except he occasionally has stuttering. 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 It was felt due to his body habitus cardiac cath would be best option to determine his coronary disease. 06/11/2012 he underwent cardiac catheterization and was found to have in-stent restenosis of the RCA, EF was found to be 60% plans were for an outpatient nuc stress test to determine ischemia but since that time patient has developed increasing episodes of angina. We had already arranged for PCI of the RCA next week. On 07/05/12 the patient had a significant episode of chest pain while at work. He was climbing steps frequently and developed midsternal chest pain with radiation into his jaw. He was also short of breath, he had no diaphoresis or nausea at that time. Pain improved but recuured  the morning of admission at rest this time he had significant chest pain rated 8/10. He came to the emergency room EKG without acute ST changes. He was admitted Rx'd with Heparin and IV NTG. Troponin negative X 3. He underwent elective PCI by Dr Allyson Sabal 07/06/12. He had an angiosculpt PCI which was successful. He is discharged 07/07/12 is stable condition and will follow up in 2 weeks. We did add a statin at discharge. He has had problems with Lipitor and Crestor in the past.    Discharge Vitals:  Blood pressure 117/74, pulse 72, temperature 97.7 F (36.5 C), temperature source Oral, resp. rate 20, height 6\' 2"  (1.88 m), weight 120.6 kg (265 lb 14 oz), SpO2 100.00%.    Labs: Results for orders placed during the hospital encounter of 07/06/12 (from the past 48 hour(s))  CBC     Status: Normal   Collection Time   07/06/12  9:59 AM      Component Value Range Comment   WBC 7.5  4.0 - 10.5 K/uL    RBC 5.27  4.22 - 5.81 MIL/uL    Hemoglobin 15.7  13.0 - 17.0 g/dL    HCT 78.2  95.6 - 21.3 %    MCV 87.5  78.0 - 100.0 fL    MCH 29.8  26.0 - 34.0 pg    MCHC 34.1  30.0 - 36.0 g/dL    RDW 08.6  57.8 - 46.9 %    Platelets 249  150 - 400 K/uL   BASIC METABOLIC PANEL     Status: Abnormal   Collection Time   07/06/12  9:59 AM  Component Value Range Comment   Sodium 136  135 - 145 mEq/L    Potassium 4.3  3.5 - 5.1 mEq/L    Chloride 97  96 - 112 mEq/L    CO2 28  19 - 32 mEq/L    Glucose, Bld 125 (*) 70 - 99 mg/dL    BUN 20  6 - 23 mg/dL    Creatinine, Ser 6.21  0.50 - 1.35 mg/dL    Calcium 9.9  8.4 - 30.8 mg/dL    GFR calc non Af Amer >90  >90 mL/min    GFR calc Af Amer >90  >90 mL/min   CK TOTAL AND CKMB     Status: Normal   Collection Time   07/06/12 11:02 AM      Component Value Range Comment   Total CK 167  7 - 232 U/L    CK, MB 2.8  0.3 - 4.0 ng/mL    Relative Index 1.7  0.0 - 2.5   HEPATIC FUNCTION PANEL     Status: Normal   Collection Time   07/06/12 11:02 AM      Component Value  Range Comment   Total Protein 7.4  6.0 - 8.3 g/dL    Albumin 3.9  3.5 - 5.2 g/dL    AST 21  0 - 37 U/L    ALT 24  0 - 53 U/L    Alkaline Phosphatase 88  39 - 117 U/L    Total Bilirubin 0.5  0.3 - 1.2 mg/dL    Bilirubin, Direct <6.5  0.0 - 0.3 mg/dL    Indirect Bilirubin NOT CALCULATED  0.3 - 0.9 mg/dL   MAGNESIUM     Status: Normal   Collection Time   07/06/12 11:02 AM      Component Value Range Comment   Magnesium 2.2  1.5 - 2.5 mg/dL   PROTIME-INR     Status: Normal   Collection Time   07/06/12 11:03 AM      Component Value Range Comment   Prothrombin Time 12.7  11.6 - 15.2 seconds    INR 0.96  0.00 - 1.49   TROPONIN I     Status: Normal   Collection Time   07/06/12  1:11 PM      Component Value Range Comment   Troponin I <0.30  <0.30 ng/mL   MRSA PCR SCREENING     Status: Normal   Collection Time   07/06/12  3:14 PM      Component Value Range Comment   MRSA by PCR NEGATIVE  NEGATIVE   POCT ACTIVATED CLOTTING TIME     Status: Normal   Collection Time   07/06/12  4:28 PM      Component Value Range Comment   Activated Clotting Time 389     TROPONIN I     Status: Normal   Collection Time   07/06/12  7:15 PM      Component Value Range Comment   Troponin I <0.30  <0.30 ng/mL   TROPONIN I     Status: Normal   Collection Time   07/07/12 12:35 AM      Component Value Range Comment   Troponin I <0.30  <0.30 ng/mL   LIPID PANEL     Status: Abnormal   Collection Time   07/07/12  7:05 AM      Component Value Range Comment   Cholesterol 221 (*) 0 - 200 mg/dL    Triglycerides 784  <696 mg/dL  HDL 39 (*) >39 mg/dL    Total CHOL/HDL Ratio 5.7      VLDL 24  0 - 40 mg/dL    LDL Cholesterol 161 (*) 0 - 99 mg/dL   TROPONIN I     Status: Normal   Collection Time   07/07/12  7:05 AM      Component Value Range Comment   Troponin I <0.30  <0.30 ng/mL   BASIC METABOLIC PANEL     Status: Abnormal   Collection Time   07/07/12  7:05 AM      Component Value Range Comment   Sodium 137   135 - 145 mEq/L    Potassium 3.9  3.5 - 5.1 mEq/L    Chloride 101  96 - 112 mEq/L    CO2 29  19 - 32 mEq/L    Glucose, Bld 110 (*) 70 - 99 mg/dL    BUN 17  6 - 23 mg/dL    Creatinine, Ser 0.96  0.50 - 1.35 mg/dL    Calcium 9.0  8.4 - 04.5 mg/dL    GFR calc non Af Amer 82 (*) >90 mL/min    GFR calc Af Amer >90  >90 mL/min   CBC     Status: Normal   Collection Time   07/07/12  7:05 AM      Component Value Range Comment   WBC 8.6  4.0 - 10.5 K/uL WHITE COUNT CONFIRMED ON SMEAR   RBC 4.78  4.22 - 5.81 MIL/uL    Hemoglobin 14.5  13.0 - 17.0 g/dL    HCT 40.9  81.1 - 91.4 %    MCV 87.7  78.0 - 100.0 fL    MCH 30.3  26.0 - 34.0 pg    MCHC 34.6  30.0 - 36.0 g/dL    RDW 78.2  95.6 - 21.3 %    Platelets 233  150 - 400 K/uL PLATELET COUNT CONFIRMED BY SMEAR    Disposition:  Follow-up Information    Follow up with Abelino Derrick, PA. On 07/21/2012. (2pm)    Contact information:   [address] [place] [address] [phone]          Discharge Medications:    Medication List     As of 07/07/2012  9:23 AM    STOP taking these medications         aspirin 325 MG tablet      TAKE these medications         acetaminophen 325 MG tablet   Commonly known as: TYLENOL   Take 2 tablets (650 mg total) by mouth every 4 (four) hours as needed.      aspirin 81 MG chewable tablet   Chew 1 tablet (81 mg total) by mouth daily.      hydrochlorothiazide 25 MG tablet   Commonly known as: HYDRODIURIL   Take 25 mg by mouth daily.      metoprolol succinate 50 MG 24 hr tablet   Commonly known as: TOPROL-XL   Take 50 mg by mouth daily. Take with or immediately following a meal.      nitroGLYCERIN 0.4 MG SL tablet   Commonly known as: NITROSTAT   Place 1 tablet (0.4 mg total) under the tongue every 5 (five) minutes x 3 doses as needed for chest pain.      olmesartan 20 MG tablet   Commonly known as: BENICAR   Take 20 mg by mouth daily.      simvastatin  20 MG tablet    Commonly known as: ZOCOR   Take 1 tablet (20 mg total) by mouth daily at 6 PM.      tetrahydrozoline 0.05 % ophthalmic solution   Place 1 drop into both eyes 2 (two) times daily.      Ticagrelor 90 MG Tabs tablet   Commonly known as: BRILINTA   Take 1 tablet (90 mg total) by mouth 2 (two) times daily.          Duration of Discharge Encounter: Greater than 30 minutes including physician time.  Jolene Provost PA-C 07/07/2012 9:23 AM

## 2012-07-07 NOTE — Progress Notes (Signed)
CARDIAC REHAB PHASE I   PRE:  Rate/Rhythm: 78 SR  BP:  Supine:   Sitting: 126/94  Standing:     SaO2:    MODE:  Ambulation:1000 ft   POST:  Rate/Rhythem: 90 SR  BP:  Supine:   Sitting: 129/80  Standing:    SaO2:  0830-0930 Pt tolerated ambulation well without c/o of cp or SOB. VS stable. Pt does c/o of numbness in left hand that he noticed he said stated around 2am. Completed PCI discharge education with pt He voices understanding. Pt declines Outpt. CRP due to his work hours.  Beatrix Fetters

## 2012-07-07 NOTE — Progress Notes (Signed)
Subjective:  No chest pain. He does have intermittent "tingling" of his Lt hand, all fingers.  Objective:  Vital Signs in the last 24 hours: Temp:  [97.6 F (36.4 C)-98 F (36.7 C)] 97.7 F (36.5 C) (12/04 0814) Pulse Rate:  [58-88] 72  (12/04 0814) Resp:  [14-23] 20  (12/04 0814) BP: (101-128)/(61-82) 117/74 mmHg (12/04 0814) SpO2:  [95 %-100 %] 100 % (12/04 0814) Weight:  [118.525 kg (261 lb 4.8 oz)-120.6 kg (265 lb 14 oz)] 120.6 kg (265 lb 14 oz) (12/04 0500)  Intake/Output from previous day:  Intake/Output Summary (Last 24 hours) at 07/07/12 0912 Last data filed at 07/07/12 0500  Gross per 24 hour  Intake   1380 ml  Output    400 ml  Net    980 ml    Physical Exam: General appearance: alert, cooperative and no distress Lungs: clear to auscultation bilaterally Heart: regular rate and rhythm Neurologic: Alert and oriented X 3, normal strength and tone. Normal symmetric reflexes. Normal coordination and gait Rt groin without hematoma   Rate: 70  Rhythm: normal sinus rhythm  Lab Results:  Basename 07/07/12 0705 07/06/12 0959  WBC 8.6 7.5  HGB 14.5 15.7  PLT 233 249    Basename 07/07/12 0705 07/06/12 0959  NA 137 136  K 3.9 4.3  CL 101 97  CO2 29 28  GLUCOSE 110* 125*  BUN 17 20  CREATININE 1.06 0.90    Basename 07/07/12 0705 07/07/12 0035  TROPONINI <0.30 <0.30   Hepatic Function Panel  Basename 07/06/12 1102  PROT 7.4  ALBUMIN 3.9  AST 21  ALT 24  ALKPHOS 88  BILITOT 0.5  BILIDIR <0.1  IBILI NOT CALCULATED    Basename 07/07/12 0705  CHOL 221*    Basename 07/06/12 1103  INR 0.96    Imaging: Imaging results have been reviewed  Cardiac Studies:  Assessment/Plan:   Principal Problem:  *Unstable angina, cresendo  Active Problems:  S/P angioplasty with stent (angiosculpt) of RCA for "in stent restenosis" 07/06/12  CAD, RCA X 3 ION DES 07/01/2010, ISR -06/23/12   Dyslipidemia, statin intol  Hx of non-ST elevation myocardial infarction  (NSTEMI), 2011  H/O: CVA (cerebrovascular accident), post cardiac cath, minimal speech residual 11/11 post PCI  HTN (hypertension)  Smoker, quit 06/30/10  Plan: Discharge later this am. Follow up with Dr Royann Shivers. Consider Crestor 5mg  3X week if he is unable to tolerate Zocor 20mg , (previously intol to Lipitor and Crestor). The "tingling" in his hand does not sound like a neurologic event but will review with MD. He did have a small CVA after his Nov 2011 PCI.    [employer] PA-C 07/07/2012, 9:12 AM

## 2012-07-07 NOTE — Progress Notes (Signed)
Pt. Seen and examined. Agree with the NP/PA-C note as written.  Chest pain is much improved. Unusual that he had in-stent restenosis of his drug-eluting stent. He had an excellent POBA result yesterday. Groin CDI, no hematoma or bruit. 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 Ok for discharge home today  .Marland Kitchen Will need to keep trying to find a tolerable statin for him that does not cause myalgias.  Follow-up with Dr. Wayne Sever, MD, [facility] Attending Cardiologist [facility]

## 2012-07-14 ENCOUNTER — Ambulatory Visit (HOSPITAL_COMMUNITY)
Admission: RE | Admit: 2012-07-14 | Payer: Managed Care, Other (non HMO) | Source: Ambulatory Visit | Admitting: Cardiovascular Disease

## 2012-07-14 ENCOUNTER — Encounter (HOSPITAL_COMMUNITY): Admission: RE | Payer: Self-pay | Source: Ambulatory Visit

## 2012-07-14 SURGERY — PERCUTANEOUS CORONARY STENT INTERVENTION (PCI-S)
Anesthesia: LOCAL | Laterality: Bilateral

## 2013-04-04 DIAGNOSIS — Z8669 Personal history of other diseases of the nervous system and sense organs: Secondary | ICD-10-CM

## 2013-04-04 HISTORY — DX: Personal history of other diseases of the nervous system and sense organs: Z86.69

## 2013-04-17 ENCOUNTER — Emergency Department (HOSPITAL_COMMUNITY): Payer: Managed Care, Other (non HMO)

## 2013-04-17 ENCOUNTER — Encounter (HOSPITAL_COMMUNITY): Payer: Self-pay | Admitting: Nurse Practitioner

## 2013-04-17 ENCOUNTER — Inpatient Hospital Stay (HOSPITAL_COMMUNITY)
Admission: EM | Admit: 2013-04-17 | Discharge: 2013-04-19 | DRG: 287 | Disposition: A | Discharge status: Home / Self Care | Payer: Managed Care, Other (non HMO) | Attending: Internal Medicine | Admitting: Internal Medicine

## 2013-04-17 DIAGNOSIS — Z6837 Body mass index (BMI) 37.0-37.9, adult: Secondary | ICD-10-CM

## 2013-04-17 DIAGNOSIS — H53459 Other localized visual field defect, unspecified eye: Secondary | ICD-10-CM | POA: Diagnosis not present

## 2013-04-17 DIAGNOSIS — E669 Obesity, unspecified: Secondary | ICD-10-CM | POA: Diagnosis present

## 2013-04-17 DIAGNOSIS — H5461 Unqualified visual loss, right eye, normal vision left eye: Secondary | ICD-10-CM

## 2013-04-17 DIAGNOSIS — I2510 Atherosclerotic heart disease of native coronary artery without angina pectoris: Principal | ICD-10-CM

## 2013-04-17 DIAGNOSIS — H53419 Scotoma involving central area, unspecified eye: Secondary | ICD-10-CM | POA: Diagnosis not present

## 2013-04-17 DIAGNOSIS — E785 Hyperlipidemia, unspecified: Secondary | ICD-10-CM

## 2013-04-17 DIAGNOSIS — H34239 Retinal artery branch occlusion, unspecified eye: Secondary | ICD-10-CM | POA: Diagnosis not present

## 2013-04-17 DIAGNOSIS — Z888 Allergy status to other drugs, medicaments and biological substances status: Secondary | ICD-10-CM

## 2013-04-17 DIAGNOSIS — R079 Chest pain, unspecified: Secondary | ICD-10-CM

## 2013-04-17 DIAGNOSIS — Z7982 Long term (current) use of aspirin: Secondary | ICD-10-CM

## 2013-04-17 DIAGNOSIS — H34231 Retinal artery branch occlusion, right eye: Secondary | ICD-10-CM

## 2013-04-17 DIAGNOSIS — I249 Acute ischemic heart disease, unspecified: Secondary | ICD-10-CM

## 2013-04-17 DIAGNOSIS — Z9861 Coronary angioplasty status: Secondary | ICD-10-CM

## 2013-04-17 DIAGNOSIS — E7800 Pure hypercholesterolemia, unspecified: Secondary | ICD-10-CM | POA: Diagnosis present

## 2013-04-17 DIAGNOSIS — E781 Pure hyperglyceridemia: Secondary | ICD-10-CM

## 2013-04-17 DIAGNOSIS — I69920 Aphasia following unspecified cerebrovascular disease: Secondary | ICD-10-CM

## 2013-04-17 DIAGNOSIS — Z95820 Peripheral vascular angioplasty status with implants and grafts: Secondary | ICD-10-CM

## 2013-04-17 DIAGNOSIS — G43109 Migraine with aura, not intractable, without status migrainosus: Secondary | ICD-10-CM | POA: Diagnosis present

## 2013-04-17 DIAGNOSIS — F17200 Nicotine dependence, unspecified, uncomplicated: Secondary | ICD-10-CM

## 2013-04-17 DIAGNOSIS — I200 Unstable angina: Secondary | ICD-10-CM

## 2013-04-17 DIAGNOSIS — Z9889 Other specified postprocedural states: Secondary | ICD-10-CM

## 2013-04-17 DIAGNOSIS — Z8249 Family history of ischemic heart disease and other diseases of the circulatory system: Secondary | ICD-10-CM

## 2013-04-17 DIAGNOSIS — R480 Dyslexia and alexia: Secondary | ICD-10-CM | POA: Diagnosis present

## 2013-04-17 DIAGNOSIS — Z8673 Personal history of transient ischemic attack (TIA), and cerebral infarction without residual deficits: Secondary | ICD-10-CM

## 2013-04-17 DIAGNOSIS — I498 Other specified cardiac arrhythmias: Secondary | ICD-10-CM | POA: Diagnosis not present

## 2013-04-17 DIAGNOSIS — I252 Old myocardial infarction: Secondary | ICD-10-CM

## 2013-04-17 DIAGNOSIS — I10 Essential (primary) hypertension: Secondary | ICD-10-CM

## 2013-04-17 DIAGNOSIS — Z87891 Personal history of nicotine dependence: Secondary | ICD-10-CM

## 2013-04-17 DIAGNOSIS — Z79899 Other long term (current) drug therapy: Secondary | ICD-10-CM

## 2013-04-17 LAB — BASIC METABOLIC PANEL
CO2: 25 mEq/L (ref 19–32)
Calcium: 9.7 mg/dL (ref 8.4–10.5)
Chloride: 103 mEq/L (ref 96–112)
Glucose, Bld: 118 mg/dL — ABNORMAL HIGH (ref 70–99)
Potassium: 4.4 mEq/L (ref 3.5–5.1)
Sodium: 139 mEq/L (ref 135–145)

## 2013-04-17 LAB — CBC
Hemoglobin: 15 g/dL (ref 13.0–17.0)
MCH: 30.1 pg (ref 26.0–34.0)
RBC: 4.98 MIL/uL (ref 4.22–5.81)
WBC: 5.5 10*3/uL (ref 4.0–10.5)

## 2013-04-17 LAB — MRSA PCR SCREENING: MRSA by PCR: NEGATIVE

## 2013-04-17 LAB — HEPARIN LEVEL (UNFRACTIONATED): Heparin Unfractionated: 0.18 IU/mL — ABNORMAL LOW (ref 0.30–0.70)

## 2013-04-17 LAB — POCT I-STAT TROPONIN I: Troponin i, poc: 0 ng/mL (ref 0.00–0.08)

## 2013-04-17 MED ORDER — ASPIRIN 81 MG PO CHEW
81.0000 mg | CHEWABLE_TABLET | Freq: Every day | ORAL | Status: DC
  Administered 2013-04-18: 81 mg via ORAL
  Filled 2013-04-17: qty 1

## 2013-04-17 MED ORDER — METOPROLOL SUCCINATE ER 50 MG PO TB24
50.0000 mg | ORAL_TABLET | Freq: Every day | ORAL | Status: DC
  Administered 2013-04-18: 50 mg via ORAL
  Filled 2013-04-17: qty 1

## 2013-04-17 MED ORDER — SIMVASTATIN 20 MG PO TABS
20.0000 mg | ORAL_TABLET | Freq: Every day | ORAL | Status: DC
  Administered 2013-04-17: 20 mg via ORAL
  Filled 2013-04-17 (×2): qty 1

## 2013-04-17 MED ORDER — SODIUM CHLORIDE 0.9 % IJ SOLN
3.0000 mL | Freq: Two times a day (BID) | INTRAMUSCULAR | Status: DC
  Administered 2013-04-17: 3 mL via INTRAVENOUS

## 2013-04-17 MED ORDER — MORPHINE SULFATE 4 MG/ML IJ SOLN
4.0000 mg | Freq: Once | INTRAMUSCULAR | Status: AC
  Administered 2013-04-17: 4 mg via INTRAVENOUS
  Filled 2013-04-17: qty 1

## 2013-04-17 MED ORDER — MORPHINE SULFATE 4 MG/ML IJ SOLN
4.0000 mg | Freq: Four times a day (QID) | INTRAMUSCULAR | Status: DC | PRN
  Administered 2013-04-17: 4 mg via INTRAVENOUS
  Filled 2013-04-17: qty 1

## 2013-04-17 MED ORDER — EZETIMIBE 10 MG PO TABS
10.0000 mg | ORAL_TABLET | Freq: Every morning | ORAL | Status: DC
  Administered 2013-04-18: 10 mg via ORAL
  Filled 2013-04-17: qty 1

## 2013-04-17 MED ORDER — ONDANSETRON HCL 4 MG/2ML IJ SOLN: 4.0000 mg | Freq: Four times a day (QID) | INTRAMUSCULAR | Status: DC | PRN

## 2013-04-17 MED ORDER — ASPIRIN 81 MG PO CHEW
324.0000 mg | CHEWABLE_TABLET | Freq: Once | ORAL | Status: AC
  Administered 2013-04-17: 324 mg via ORAL
  Filled 2013-04-17: qty 4

## 2013-04-17 MED ORDER — SODIUM CHLORIDE 0.9 % IV SOLN: 250.0000 mL | INTRAVENOUS | Status: DC | PRN

## 2013-04-17 MED ORDER — ASPIRIN 81 MG PO CHEW
324.0000 mg | CHEWABLE_TABLET | ORAL | Status: DC
  Filled 2013-04-17: qty 4

## 2013-04-17 MED ORDER — HEPARIN BOLUS VIA INFUSION
4000.0000 [IU] | Freq: Once | INTRAVENOUS | Status: AC
  Administered 2013-04-17: 4000 [IU] via INTRAVENOUS
  Filled 2013-04-17: qty 4000

## 2013-04-17 MED ORDER — SODIUM CHLORIDE 0.9 % IJ SOLN: 3.0000 mL | INTRAMUSCULAR | Status: DC | PRN

## 2013-04-17 MED ORDER — HYDROCHLOROTHIAZIDE 25 MG PO TABS
25.0000 mg | ORAL_TABLET | Freq: Every day | ORAL | Status: DC
  Administered 2013-04-18: 25 mg via ORAL
  Filled 2013-04-17: qty 1

## 2013-04-17 MED ORDER — ACETAMINOPHEN 325 MG PO TABS: 650.0000 mg | ORAL_TABLET | ORAL | Status: DC | PRN

## 2013-04-17 MED ORDER — HEPARIN (PORCINE) IN NACL 100-0.45 UNIT/ML-% IJ SOLN
1700.0000 [IU]/h | INTRAMUSCULAR | Status: DC
  Administered 2013-04-17: 1400 [IU]/h via INTRAVENOUS
  Filled 2013-04-17 (×3): qty 250

## 2013-04-17 MED ORDER — NITROGLYCERIN 0.4 MG SL SUBL: 0.4000 mg | SUBLINGUAL_TABLET | SUBLINGUAL | Status: DC | PRN

## 2013-04-17 MED ORDER — IRBESARTAN 150 MG PO TABS
150.0000 mg | ORAL_TABLET | Freq: Every day | ORAL | Status: DC
  Administered 2013-04-18: 150 mg via ORAL
  Filled 2013-04-17: qty 1

## 2013-04-17 MED ORDER — NITROGLYCERIN 0.4 MG SL SUBL
SUBLINGUAL_TABLET | SUBLINGUAL | Status: AC
  Filled 2013-04-17: qty 62.5

## 2013-04-17 MED ORDER — SODIUM CHLORIDE 0.9 % IJ SOLN: 3.0000 mL | Freq: Two times a day (BID) | INTRAMUSCULAR | Status: DC

## 2013-04-17 MED ORDER — ZOLPIDEM TARTRATE 5 MG PO TABS: 5.0000 mg | ORAL_TABLET | Freq: Every evening | ORAL | Status: DC | PRN

## 2013-04-17 MED ORDER — SODIUM CHLORIDE 0.9 % IV SOLN: 1.0000 mL/kg/h | INTRAVENOUS | Status: DC

## 2013-04-17 MED ORDER — MORPHINE SULFATE 4 MG/ML IJ SOLN
4.0000 mg | INTRAMUSCULAR | Status: DC | PRN
  Administered 2013-04-17 – 2013-04-18 (×2): 4 mg via INTRAVENOUS
  Filled 2013-04-17 (×4): qty 1

## 2013-04-17 NOTE — Progress Notes (Signed)
ANTICOAGULATION CONSULT NOTE - Initial Consult  Pharmacy Consult for heparin Indication: chest pain/ACS  Allergies  Allergen Reactions  . Ace Inhibitors Cough  . Lipitor [Atorvastatin] Other (See Comments)    myalgia  . Codeine Itching    Patient Measurements: Height: 6\' 1"  (185.4 cm) Weight: 270 lb (122.471 kg) IBW/kg (Calculated) : 79.9 Heparin Dosing Weight: 107kg  Vital Signs: Temp: 98.8 F (37.1 C) (09/14 0921) Temp src: Oral (09/14 0921) BP: 123/77 mmHg (09/14 1045) Pulse Rate: 78 (09/14 1045)  Labs:  Recent Labs  04/17/13 0925  HGB 15.0  HCT 43.3  PLT 232  CREATININE 0.74    Estimated Creatinine Clearance: 154.8 ml/min (by C-G formula based on Cr of 0.74).   Medical History: Past Medical History  Diagnosis Date  . Hypertension   . Coronary artery disease   . S/P angioplasty with stent (angiosculpt) of RCA for "in stent restenosis" 07/06/2012  . Hypercholesteremia   . Anginal pain   . NSTEMI (non-ST elevated myocardial infarction) 2011  . Stroke 2011    S/P cardiac cath; denies residual (07/06/2012)  . Migraines     "maybe one/yr" (07/06/2012)    Medications:  See med history section  Assessment: 47 year old man with known CAD to be admitted for treatment of ACS.  Heparin infusion to start by pharmacy protocol Goal of Therapy:  Heparin level 0.3-0.7 units/ml Monitor platelets by anticoagulation protocol: Yes   Plan:  Give 4000 units bolus x 1 followed by 1400 units/hr. Heparin level in 6 hours - adjust per goal. Daily CBC and heparin level while on heparin.  Mickeal Skinner 04/17/2013,11:11 AM

## 2013-04-17 NOTE — ED Notes (Signed)
MD at bedside. 

## 2013-04-17 NOTE — ED Notes (Signed)
Pt returned from CT °

## 2013-04-17 NOTE — ED Notes (Signed)
Patient transported to CT 

## 2013-04-17 NOTE — ED Notes (Signed)
Delay in starting heparin due to positional IV in right hand

## 2013-04-17 NOTE — Progress Notes (Signed)
ANTICOAGULATION CONSULT NOTE - Initial Consult  Pharmacy Consult for heparin Indication: chest pain/ACS  Allergies  Allergen Reactions  . Ace Inhibitors Cough  . Lipitor [Atorvastatin] Other (See Comments)    myalgia  . Codeine Itching    Patient Measurements: Height: 6\' 1"  (185.4 cm) Weight: 275 lb 5.7 oz (124.9 kg) IBW/kg (Calculated) : 79.9 Heparin Dosing Weight: 107kg  Vital Signs: Temp: 97.9 F (36.6 C) (09/14 1938) Temp src: Oral (09/14 1938) BP: 122/71 mmHg (09/14 1938) Pulse Rate: 78 (09/14 1938)  Labs:  Recent Labs  04/17/13 0925 04/17/13 1507 04/17/13 2010  HGB 15.0  --   --   HCT 43.3  --   --   PLT 232  --   --   HEPARINUNFRC  --   --  0.18*  CREATININE 0.74  --   --   TROPONINI  --  <0.30  --     Estimated Creatinine Clearance: 156.4 ml/min (by C-G formula based on Cr of 0.74).   Medical History: Past Medical History  Diagnosis Date  . Hypertension   . Coronary artery disease   . S/P angioplasty with stent (angiosculpt) of RCA for "in stent restenosis" 07/06/2012  . Hypercholesteremia   . Anginal pain   . NSTEMI (non-ST elevated myocardial infarction) 2011  . Stroke 2011    S/P cardiac cath; denies residual (07/06/2012)  . Migraines     "maybe one/yr" (07/06/2012)    Assessment: 47 year old man with known CAD to be admitted for treatment of ACS.  There was a delay in starting heparin this afternoon due to IV access issues. First heparin level resulted low at 0.18. No bleeding noted.  Goal of Therapy:  Heparin level 0.3-0.7 units/ml Monitor platelets by anticoagulation protocol: Yes   Plan:  - increase heparin to 1700units/hr - recheck level with AM labs  Reznor Ferrando D. Mariama Saintvil, PharmD Clinical Pharmacist Pager: [phone] 04/17/2013 9:25 PM

## 2013-04-17 NOTE — ED Notes (Signed)
IV team states they will be coming to see pt next

## 2013-04-17 NOTE — H&P (Signed)
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 He seen last in the office on 07/2012 by Corine Shelter, PA-C, as a posthospital followup. He was hospitalized July 06, 2012 to July 07, 2012, for unstable angina. He underwent diagnostic catheterization and AngioSculpt intervention to the RCA for in-stent restenosis. He had had previous RCA drug-eluting stent placed November 2011. He tolerated this well. His troponins were negative. He was put on Brilinta and aspirin. He had some complaints of chest discomfort after the stent which he felt were musculoskeletal. 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 This seems to radiate to the left arm and jaw. Symptoms are similar to what he experienced in 2013 when he had cutting balloon angioplasty to the RCA. He does have statin intolerance, but is taking Zetia. EKG is unchanged, if not somewhat less T wave change compared to prior EKG.  PMHx:  Past Medical History  Diagnosis Date  . Hypertension   . Coronary artery disease   . S/P angioplasty with stent (angiosculpt) of RCA for "in stent restenosis" 07/06/2012  . Hypercholesteremia   . Anginal pain   . NSTEMI (non-ST elevated myocardial infarction) 2011  . Stroke 2011    S/P cardiac cath; denies residual (07/06/2012)  . Migraines     "maybe one/yr" (07/06/2012)    Past Surgical History  Procedure Laterality Date  . Septoplasty  ?2002  . Coronary angioplasty with stent placement  2011    "3" (07/06/2012)  . Coronary angioplasty  07/06/2012    FAMHx:  Family History  Problem Relation Age of Onset  . Atrial fibrillation Mother   . Mitral valve prolapse Mother   . Coronary artery disease Father     SOCHx:   reports that he  quit smoking about 2 years ago. His smoking use included Cigarettes. He has a 15 pack-year smoking history. He quit smokeless tobacco use about 2 years ago. His smokeless tobacco use included Chew. He reports that  drinks alcohol. He reports that he does not use illicit drugs.  ALLERGIES:  Allergies  Allergen Reactions  . Ace Inhibitors Cough  . Lipitor [Atorvastatin] Other (See Comments)    myalgia  . Codeine Itching    ROS: A comprehensive review of systems was negative except for: Cardiovascular: positive for chest pressure/discomfort Musculoskeletal: positive for myalgias and neck pain  HOME MEDS:  (Not in a hospital admission)  LABS/IMAGING: Results for orders placed during the hospital encounter of 04/17/13 (from the past 48 hour(s))  CBC     Status: None   Collection Time    04/17/13  9:25 AM      Result Value Range   WBC 5.5  4.0 - 10.5 K/uL   RBC 4.98  4.22 - 5.81 MIL/uL   Hemoglobin 15.0  13.0 - 17.0 g/dL   HCT 16.1  09.6 - 04.5 %   MCV 86.9  78.0 - 100.0 fL   MCH 30.1  26.0 - 34.0 pg   MCHC 34.6  30.0 - 36.0 g/dL   RDW 40.9  81.1 - 91.4 %   Platelets 232  150 - 400 K/uL  BASIC METABOLIC PANEL     Status: Abnormal   Collection Time    04/17/13  9:25 AM      Result Value Range   Sodium 139  135 - 145 mEq/L   Potassium 4.4  3.5 - 5.1 mEq/L   Chloride 103  96 - 112 mEq/L   CO2 25  19 - 32 mEq/L   Glucose, Bld 118 (*) 70 - 99 mg/dL   BUN 13  6 - 23 mg/dL   Creatinine, Ser 6.29  0.50 - 1.35 mg/dL   Calcium 9.7  8.4 - 52.8 mg/dL   GFR calc non Af Amer >90  >90 mL/min   GFR calc Af Amer >90  >90 mL/min   Comment: (NOTE)     The eGFR has been calculated using the CKD EPI equation.     This calculation has not been validated in all clinical situations.     eGFR's persistently <90 mL/min signify possible Chronic Kidney     Disease.  PRO B NATRIURETIC PEPTIDE     Status: None   Collection Time    04/17/13  9:25 AM      Result Value Range   Pro B Natriuretic  peptide (BNP) 10.4  0 - 125 pg/mL  POCT I-STAT TROPONIN I     Status: None   Collection Time    04/17/13  9:52 AM      Result Value Range   Troponin i, poc 0.00  0.00 - 0.08 ng/mL   Comment 3            Comment: Due to the release kinetics of cTnI,     a negative result within the first hours     of the onset of symptoms does not rule out     myocardial infarction with certainty.     If myocardial infarction is still suspected,     repeat the test at appropriate intervals.   Dg Chest 2 View  04/17/2013   CLINICAL DATA:  Chest pain, shortness of Breath  EXAM: CHEST  2 VIEW  COMPARISON:  07/06/2012  FINDINGS: The heart size and mediastinal contours are within normal limits. Both lungs are clear. The visualized skeletal structures are stable. Left coronary stent again noted.  IMPRESSION: No active cardiopulmonary disease.   Electronically Signed   By: Natasha Mead   On: 04/17/2013 10:33    VITALS: Filed Vitals:   04/17/13 1000  BP: 133/85  Pulse: 82  Temp:   Resp: 13    EXAM: General appearance: alert and no distress Neck: no adenopathy, no carotid bruit, no JVD, supple, symmetrical, trachea midline and thyroid not enlarged, symmetric, no tenderness/mass/nodules Lungs: clear to auscultation bilaterally Heart: regular rate and rhythm, S1, S2 normal, no murmur, click, rub or gallop Abdomen: soft, non-tender; bowel sounds normal; no masses,  no organomegaly Extremities: extremities normal, atraumatic, no cyanosis or edema Pulses: 2+ and symmetric Skin: Skin color, texture, turgor normal. No rashes or lesions Neurologic: Grossly normal  IMPRESSION: Principal Problem:   Chest pain Active Problems:   CAD, RCA X 3 ION DES 07/01/2010, ISR -06/23/12    Dyslipidemia, statin intol   HTN (hypertension)   PLAN: 1. Symptoms are concerning for progressive angina. Unclear why he felt worse with nitro (said he had a sharp pain that lasted for ~1 minute after taking nitro and resolved). Has  improved with morphine. He will need to be admitted and I would recommend cardiac catheterization given his history. The timing is pretty good for restenosis given his prior POBA to the RCA. It should be noted that he  had no other significant disease at his last cath.  I discussed options with him, but feel we should go directly to right heart catheterization. He has requested Dr. Tresa Endo to do this. Keep NPO p MN. Heparin per pharmacy. Morphine prn for pain.  Chrystie Nose, MD, [facility] Attending Cardiologist [facility]  HILTY,Kenneth C 04/17/2013, 10:39 AM

## 2013-04-17 NOTE — ED Provider Notes (Signed)
CSN: [id number]     Arrival date & time 04/17/13  0907 History   First MD Initiated Contact with Patient 04/17/13 [phone]     Chief Complaint  Patient presents with  . Chest Pain   (Consider location/radiation/quality/duration/timing/severity/associated sxs/prior Treatment) HPI Comments: 47 year old male with a past medical history of hypertension, CAD, NSTEMI in 2011, angioplasty with stent placement December, 2013 migraines presents to the emergency department complaining of gradual onset midsternal chest pain for the past week and half, worsening last night. Pain located midsternal towards the left, radiating to his back. Pain described as pressure, rated 5/10, worse on exertion. Last night he had radiation towards his jaw. Tried taking a nitroglycerin last night which increased his pain, had an 81 mg aspirin this morning. Admits to associated shortness of breath on exertion, leg swelling and nausea. Last saw cardiology at [facility] 6 months ago. Denies headache, weakness, speech changes, fever or chills.  Patient is a 47 y.o. male presenting with chest pain. The history is provided by the patient and the spouse.  Chest Pain Associated symptoms: nausea and shortness of breath   Associated symptoms: no diaphoresis, no dizziness, no fever, no headache, not vomiting and no weakness     Past Medical History  Diagnosis Date  . Hypertension   . Coronary artery disease   . S/P angioplasty with stent (angiosculpt) of RCA for "in stent restenosis" 07/06/2012  . Hypercholesteremia   . Anginal pain   . NSTEMI (non-ST elevated myocardial infarction) 2011  . Stroke 2011    S/P cardiac cath; denies residual (07/06/2012)  . Migraines     "maybe one/yr" (07/06/2012)   Past Surgical History  Procedure Laterality Date  . Septoplasty  ?2002  . Coronary angioplasty with stent placement  2011    "3" (07/06/2012)  . Coronary angioplasty  07/06/2012   Family History  Problem Relation Age  of Onset  . Atrial fibrillation Mother   . Mitral valve prolapse Mother   . Coronary artery disease Father    History  Substance Use Topics  . Smoking status: Former Smoker -- 1.00 packs/day for 15 years    Types: Cigarettes    Quit date: 06/30/2010  . Smokeless tobacco: Former Neurosurgeon    Types: Chew    Quit date: 06/30/2010  . Alcohol Use: 0.0 oz/week     Comment: 2 drinks/ day    Review of Systems  Constitutional: Negative for fever, chills and diaphoresis.  Respiratory: Positive for shortness of breath.   Cardiovascular: Positive for chest pain and leg swelling.  Gastrointestinal: Positive for nausea. Negative for vomiting.  Neurological: Negative for dizziness, weakness, light-headedness and headaches.  All other systems reviewed and are negative.    Allergies  Ace inhibitors; Lipitor; and Codeine  Home Medications   Current Outpatient Rx  Name  Route  Sig  Dispense  Refill  . acetaminophen (TYLENOL) 325 MG tablet   Oral   Take 2 tablets (650 mg total) by mouth every 4 (four) hours as needed.         Marland Kitchen aspirin 81 MG chewable tablet   Oral   Chew 1 tablet (81 mg total) by mouth daily.         . hydrochlorothiazide (HYDRODIURIL) 25 MG tablet   Oral   Take 25 mg by mouth daily.         . metoprolol succinate (TOPROL-XL) 50 MG 24 hr tablet   Oral   Take 50 mg by  mouth daily. Take with or immediately following a meal.         . nitroGLYCERIN (NITROSTAT) 0.4 MG SL tablet   Sublingual   Place 1 tablet (0.4 mg total) under the tongue every 5 (five) minutes x 3 doses as needed for chest pain.   25 tablet   2   . olmesartan (BENICAR) 20 MG tablet   Oral   Take 20 mg by mouth daily.         . simvastatin (ZOCOR) 20 MG tablet   Oral   Take 1 tablet (20 mg total) by mouth daily at 6 PM.   30 tablet   5   . tetrahydrozoline 0.05 % ophthalmic solution   Both Eyes   Place 1 drop into both eyes 2 (two) times daily.         . Ticagrelor (BRILINTA) 90  MG TABS tablet   Oral   Take 1 tablet (90 mg total) by mouth 2 (two) times daily.   60 tablet   11    BP 128/73  Pulse 89  Temp(Src) 98.8 F (37.1 C) (Oral)  Resp 16  Ht 6\' 1"  (1.854 m)  Wt 270 lb (122.471 kg)  BMI 35.63 kg/m2  SpO2 95% Physical Exam  Nursing note and vitals reviewed. Constitutional: He is oriented to person, place, and time. He appears well-developed and well-nourished. No distress.  HENT:  Head: Normocephalic and atraumatic.  Mouth/Throat: Oropharynx is clear and moist.  Eyes: Conjunctivae are normal.  Neck: Normal range of motion. Neck supple.  Cardiovascular: Normal rate, regular rhythm, normal heart sounds, intact distal pulses and normal pulses.   +1 pitting edema LE bilateral.  Pulmonary/Chest: Effort normal and breath sounds normal. He has no decreased breath sounds. He has no wheezes. He has no rhonchi. He has no rales. He exhibits no tenderness.  Abdominal: Soft. Bowel sounds are normal. There is no tenderness.  Musculoskeletal: Normal range of motion. He exhibits no edema.  Neurological: He is alert and oriented to person, place, and time. He has normal strength. No sensory deficit.  Skin: Skin is warm and dry. He is not diaphoretic.  Psychiatric: He has a normal mood and affect. His behavior is normal.    ED Course  Procedures (including critical care time) Labs Review Labs Reviewed  CBC  BASIC METABOLIC PANEL  PRO B NATRIURETIC PEPTIDE  POCT I-STAT TROPONIN I    Date: 04/17/2013  Rate: 83  Rhythm: normal sinus rhythm  QRS Axis: normal  Intervals: normal  ST/T Wave abnormalities: normal  Conduction Disutrbances:none  Narrative Interpretation: no stemi, no sig changes since EKG obtained on 07/06/2012  Old EKG Reviewed: unchanged   Imaging Review No results found.  MDM   1. Acute coronary syndrome      Patient presenting with chest pain, shortness of breath and leg swelling. History of significant CAD. He appears well and in no  apparent distress with normal vital signs. Physical exam unremarkable other than +1 pitting edema in bilateral lower extremities. No pain relieved with nitroglycerin last night. Will give morphine for his pain. Labs pending- cbc, bmp, bnp, troponin. EKG unchanged. Chest x-ray pending. Patient also evaluated by my attending Dr. Manus Gunning who agrees with plan of care. 10:13 AM Troponin negative. I spoke with [facility] on call physician who will evaluate patient. He reports imprved pain after receiving morphine. 11:01 AM Dr. Rennis Golden admitting patient.  Trevor Mace, PA-C 04/17/13 1101

## 2013-04-17 NOTE — ED Provider Notes (Signed)
Medical screening examination/treatment/procedure(s) were conducted as a shared visit with non-physician practitioner(s) and myself.  I personally evaluated the patient during the encounter  Ongoing intermittent substernal chest pain radiating to back for past week. Similar to instent resteonsis in December. EKG unchanged. Troponin negative. dw cardiology.  Glynn Octave, MD 04/17/13 [phone]

## 2013-04-17 NOTE — ED Notes (Signed)
Pt states that the IV in his right hand is bothering him. Requested second nurse to look for IV. Second nurse attempted and requested IV Team to look for IV.

## 2013-04-17 NOTE — ED Notes (Addendum)
Pt reports onset midsternal CP last night, radiating into jaw, intermittently worse since onset. Pain has not gone away since last night. Took nitro last night and it made the pain worse. Pain increased with exertion. Pt took 81 mg ASA this am. Pt has hx MI 2 years ago

## 2013-04-17 NOTE — ED Notes (Signed)
Report given to floor RN - Marg

## 2013-04-17 NOTE — ED Notes (Signed)
IV team at bedside attempting second IV

## 2013-04-18 ENCOUNTER — Inpatient Hospital Stay (HOSPITAL_COMMUNITY): Payer: Managed Care, Other (non HMO)

## 2013-04-18 ENCOUNTER — Encounter (HOSPITAL_COMMUNITY): Payer: Self-pay | Admitting: General Practice

## 2013-04-18 ENCOUNTER — Encounter (HOSPITAL_COMMUNITY)
Admission: EM | Disposition: A | Discharge status: Home / Self Care | Payer: Self-pay | Source: Home / Self Care | Attending: Internal Medicine

## 2013-04-18 DIAGNOSIS — H5460 Unqualified visual loss, one eye, unspecified: Secondary | ICD-10-CM

## 2013-04-18 DIAGNOSIS — R079 Chest pain, unspecified: Secondary | ICD-10-CM

## 2013-04-18 HISTORY — PX: CARDIAC CATHETERIZATION: SHX172

## 2013-04-18 HISTORY — PX: LEFT HEART CATHETERIZATION WITH CORONARY ANGIOGRAM: SHX5451

## 2013-04-18 LAB — CBC
MCH: 30.3 pg (ref 26.0–34.0)
Platelets: 231 10*3/uL (ref 150–400)
RBC: 4.52 MIL/uL (ref 4.22–5.81)
WBC: 6.9 10*3/uL (ref 4.0–10.5)

## 2013-04-18 LAB — PROTIME-INR
INR: 1.04 (ref 0.00–1.49)
Prothrombin Time: 13.4 seconds (ref 11.6–15.2)

## 2013-04-18 LAB — TROPONIN I: Troponin I: <0.3 ng/mL (ref <0.30)

## 2013-04-18 LAB — LIPID PANEL
LDL Cholesterol: UNDETERMINED mg/dL (ref 0–99)
Triglycerides: 585 mg/dL — ABNORMAL HIGH (ref <150)

## 2013-04-18 LAB — HEPARIN LEVEL (UNFRACTIONATED): Heparin Unfractionated: 0.36 IU/mL (ref 0.30–0.70)

## 2013-04-18 SURGERY — LEFT HEART CATHETERIZATION WITH CORONARY ANGIOGRAM
Anesthesia: LOCAL

## 2013-04-18 MED ORDER — MEPERIDINE HCL 100 MG/ML IJ SOLN
75.0000 mg | Freq: Once | INTRAMUSCULAR | Status: AC
  Administered 2013-04-18: 75 mg via INTRAMUSCULAR

## 2013-04-18 MED ORDER — MIDAZOLAM HCL 5 MG/5ML IJ SOLN
INTRAMUSCULAR | Status: AC
  Filled 2013-04-18: qty 5

## 2013-04-18 MED ORDER — HEPARIN (PORCINE) IN NACL 2-0.9 UNIT/ML-% IJ SOLN
INTRAMUSCULAR | Status: AC
  Filled 2013-04-18: qty 1000

## 2013-04-18 MED ORDER — LIDOCAINE HCL (PF) 1 % IJ SOLN
INTRAMUSCULAR | Status: AC
  Filled 2013-04-18: qty 30

## 2013-04-18 MED ORDER — PROMETHAZINE HCL 25 MG/ML IJ SOLN
INTRAMUSCULAR | Status: AC
  Filled 2013-04-18: qty 1

## 2013-04-18 MED ORDER — FENOFIBRATE 160 MG PO TABS
160.0000 mg | ORAL_TABLET | Freq: Every day | ORAL | Status: DC
  Administered 2013-04-18: 160 mg via ORAL
  Filled 2013-04-18 (×2): qty 1

## 2013-04-18 MED ORDER — MORPHINE SULFATE 2 MG/ML IJ SOLN
INTRAMUSCULAR | Status: AC
  Administered 2013-04-18: 20:00:00 2 mg via INTRAVENOUS
  Filled 2013-04-18: qty 1

## 2013-04-18 MED ORDER — FENOFIBRATE 160 MG PO TABS: 160.0000 mg | ORAL_TABLET | Freq: Every day | ORAL | Status: DC

## 2013-04-18 MED ORDER — ONDANSETRON HCL 4 MG/2ML IJ SOLN: 4.0000 mg | Freq: Four times a day (QID) | INTRAMUSCULAR | Status: DC | PRN

## 2013-04-18 MED ORDER — FENTANYL CITRATE 0.05 MG/ML IJ SOLN
INTRAMUSCULAR | Status: AC
  Filled 2013-04-18: qty 2

## 2013-04-18 MED ORDER — OMEGA-3-ACID ETHYL ESTERS 1 G PO CAPS: 1.0000 g | ORAL_CAPSULE | Freq: Every day | ORAL | Status: DC

## 2013-04-18 MED ORDER — PROMETHAZINE HCL 25 MG/ML IJ SOLN
25.0000 mg | Freq: Once | INTRAMUSCULAR | Status: AC
  Administered 2013-04-18: 25 mg via INTRAMUSCULAR

## 2013-04-18 MED ORDER — SODIUM CHLORIDE 0.9 % IV SOLN
INTRAVENOUS | Status: DC
  Administered 2013-04-18: 16:00:00 via INTRAVENOUS

## 2013-04-18 MED ORDER — MORPHINE SULFATE 2 MG/ML IJ SOLN
2.0000 mg | INTRAMUSCULAR | Status: DC | PRN
Start: 2013-04-18 — End: 2013-04-20

## 2013-04-18 MED ORDER — ACETAMINOPHEN 325 MG PO TABS: 650.0000 mg | ORAL_TABLET | ORAL | Status: DC | PRN

## 2013-04-18 MED ORDER — NITROGLYCERIN 0.2 MG/ML ON CALL CATH LAB
INTRAVENOUS | Status: AC
  Filled 2013-04-18: qty 1

## 2013-04-18 MED ORDER — OMEGA-3-ACID ETHYL ESTERS 1 G PO CAPS
1.0000 g | ORAL_CAPSULE | Freq: Every day | ORAL | Status: DC
  Administered 2013-04-18: 1 g via ORAL
  Filled 2013-04-18 (×2): qty 1

## 2013-04-18 NOTE — Progress Notes (Signed)
Nada Boozer NP called and instructions given to call a code stroke. Code stroke called and rapid response notified.

## 2013-04-18 NOTE — H&P (View-Only) (Signed)
Subjective: Continues with chest pain, midsternal to back.  Morphine does improve the pain.  Currently 2/10.  Up bathing.  Symptoms like before with occlusive disease.     Objective: Vital signs in last 24 hours: Temp:  [97.6 F (36.4 C)-98.8 F (37.1 C)] 97.6 F (36.4 C) (09/15 0745) Pulse Rate:  [62-92] 62 (09/15 0745) Resp:  [8-25] 13 (09/15 0745) BP: (100-137)/(61-97) 108/87 mmHg (09/15 0745) SpO2:  [94 %-100 %] 98 % (09/15 0745) FiO2 (%):  [21 %] 21 % (09/14 1343) Weight:  [270 lb (122.471 kg)-282 lb 6.6 oz (128.1 kg)] 282 lb 6.6 oz (128.1 kg) (09/15 0350) Weight change:  Last BM Date: 04/17/13 Intake/Output from previous day: +672 09/14 0701 - 09/15 0700 In: 1272 [P.O.:720; I.V.:552] Out: 600 [Urine:600] Intake/Output this shift: Total I/O In: -  Out: 450 [Urine:450]  PE: General:Pleasant affect, NAD Skin:Warm and dry, brisk capillary refill HEENT:normocephalic, sclera clear, mucus membranes moist Heart:S1S2 RRR without murmur, gallup, rub or click Lungs:clear without rales, rhonchi, or wheezes GMW:NUUV, non tender, + BS, do not palpate liver spleen or masses Ext:no lower ext edema, 2+ pedal pulses, 2+ radial pulses Neuro:alert and oriented, MAE, follows commands, + facial symmetry   Lab Results:  Recent Labs  04/17/13 0925 04/18/13 0221  WBC 5.5 6.9  HGB 15.0 13.7  HCT 43.3 40.1  PLT 232 231   BMET  Recent Labs  04/17/13 0925  NA 139  K 4.4  CL 103  CO2 25  GLUCOSE 118*  BUN 13  CREATININE 0.74  CALCIUM 9.7    Recent Labs  04/17/13 2010 04/18/13 0221  TROPONINI <0.30 <0.30    Lab Results  Component Value Date   CHOL 201* 04/18/2013   HDL 28* 04/18/2013   LDLCALC UNABLE TO CALCULATE IF TRIGLYCERIDE OVER 400 mg/dL 2/53/6644   TRIG 034* 7/42/5956   CHOLHDL 7.2 04/18/2013   Lab Results  Component Value Date   HGBA1C  Value: 6.0 (NOTE)                                                                       According to the ADA  Clinical Practice Recommendations for 2011, when HbA1c is used as a screening test:   >=6.5%   Diagnostic of Diabetes Mellitus           (if abnormal result  is confirmed)  5.7-6.4%   Increased risk of developing Diabetes Mellitus  References:Diagnosis and Classification of Diabetes Mellitus,Diabetes Care,2011,34(Suppl 1):S62-S69 and Standards of Medical Care in         Diabetes - 2011,Diabetes Care,2011,34  (Suppl 1):S11-S61.* 07/01/2010     Lab Results  Component Value Date   TSH 0.999 07/01/2010    Hepatic Function Panel No results found for this basename: PROT, ALBUMIN, AST, ALT, ALKPHOS, BILITOT, BILIDIR, IBILI,  in the last 72 hours  Recent Labs  04/18/13 0221  CHOL 201*   No results found for this basename: PROTIME,  in the last 72 hours    Studies/Results: Dg Chest 2 View  04/17/2013   CLINICAL DATA:  Chest pain, shortness of Breath  EXAM: CHEST  2 VIEW  COMPARISON:  07/06/2012  FINDINGS: The heart size and mediastinal contours are within  normal limits. Both lungs are clear. The visualized skeletal structures are stable. Left coronary stent again noted.  IMPRESSION: No active cardiopulmonary disease.   Electronically Signed   By: Natasha Mead   On: 04/17/2013 10:33    Medications: I have reviewed the patient's current medications. Scheduled Meds: . aspirin  324 mg Oral Pre-Cath  . aspirin  81 mg Oral Daily  . ezetimibe  10 mg Oral q morning - 10a  . hydrochlorothiazide  25 mg Oral Daily  . irbesartan  150 mg Oral Daily  . metoprolol succinate  50 mg Oral Daily  . simvastatin  20 mg Oral q1800  . sodium chloride  3 mL Intravenous Q12H  . sodium chloride  3 mL Intravenous Q12H   Continuous Infusions: . sodium chloride 1 mL/kg/hr (04/18/13 0400)  . heparin 1,700 Units/hr (04/17/13 2200)   PRN Meds:.sodium chloride, sodium chloride, acetaminophen, morphine injection, nitroGLYCERIN, ondansetron (ZOFRAN) IV, sodium chloride, sodium chloride,  zolpidem  Assessment/Plan: Principal Problem:   Unstable angina, cresendo  Active Problems:   CAD, RCA X 3 ION DES 07/01/2010, ISR -06/23/12    Dyslipidemia, statin intol   HTN (hypertension)  PLAN:  Negative MI, negative Pro BNP, No acute EKG changes  TG elevated at 585,  HgB A1C is 6.0.   On Zetia and Pravachol.  Plan for cardiac cath today.  Add fenofibrate, fish oil. Will have diabetes coordinator see the pt.   LOS: 1 day   Time spent with pt. :15 minutes. [facility] R  Nurse Practitioner Certified Pager [phone] 04/18/2013, 8:50 AM   Patient seen and examined. Agree with assessment and plan. Very pleasant 47 yo WM who is s/p initial stenting to RCA in 11/ 2013. He developed late restenosis in 07/2012 and underwent cutting balloon PCI. He now is readmitted with recurrent symprtoms similar to December. Enzymes are negative. Lipid panel is c/w atherogenic dyslipidemic profile, suspect hyperinsulinemia and metabolic syndrome. Discussed cath with potential intervention. He had failed attempt at radial poecedure previously due to "loop," plan femorally. Add fenofibrate and lovaza with TG >500.   Lennette Bihari, MD, [facility] 04/18/2013 9:15 AM

## 2013-04-18 NOTE — Code Documentation (Signed)
Patient underwent cardiac cath today, in recovery around 1500 he noticed a visual change in the right field which is common prior to the development of his migraines. Pain medication and antiemetic given by recovery nurse at that time. Visual disturbance noted by floor RN and reported to MD. Code stroke later called at 1907, LKW 1500, stroke team arrived at 1909, neurologist arrived at 1911, patient arrived to CT at [facility], CT read by Dr. Thad Ranger at [phone]. Initial NIH 0. Patient complains of crescent shaped area of loss of vision in right eye. Will continue to monitor.

## 2013-04-18 NOTE — Consult Note (Signed)
Referring Physician: Hilty     Chief Complaint: Visual changes  HPI: Dylan [name] is an 47 y.o. male who underwent a cardiac cath today.  When he became aware of things in the holding area he began to notice a visual scotoma develop in the right visual field which is quite common prior to the development of his migraines.  Today after this developed there also developed a crescent shaped area of loss of vision in the right eye.  The left eye has no visual changes.  The patient made nursing aware and a code stroke was called.  Initial NIHSS of 0.    Date last known well: Date: 04/18/2013 Time last known well: Time: 15:00 tPA Given: No: Outside time window, low NIHSS  Past Medical History  Diagnosis Date  . Hypertension   . Coronary artery disease   . S/P angioplasty with stent (angiosculpt) of RCA for "in stent restenosis" 07/06/2012  . Hypercholesteremia   . Anginal pain   . NSTEMI (non-ST elevated myocardial infarction) 2011  . Stroke 2011    S/P cardiac cath; denies residual (07/06/2012)  . Migraines     "maybe one/yr" (07/06/2012)    Past Surgical History  Procedure Laterality Date  . Septoplasty  ?2002  . Coronary angioplasty with stent placement  2011    "3" (07/06/2012)  . Coronary angioplasty  07/06/2012  . Cardiac catheterization  04/18/2013    Family History  Problem Relation Age of Onset  . Atrial fibrillation Mother   . Mitral valve prolapse Mother   . Coronary artery disease Father    Social History:  reports that he quit smoking about 2 years ago. His smoking use included Cigarettes. He has a 15 pack-year smoking history. He quit smokeless tobacco use about 2 years ago. His smokeless tobacco use included Chew. He reports that he drinks about 16.8 ounces of alcohol per week. He reports that he does not use illicit drugs.  Allergies:  Allergies  Allergen Reactions  . Ace Inhibitors Cough  . Lipitor [Atorvastatin] Other (See Comments)    myalgia  . Codeine  Itching    Medications:  I have reviewed the patient's current medications. Prior to Admission:  Prescriptions prior to admission  Medication Sig Dispense Refill  . aspirin 81 MG chewable tablet Chew 1 tablet (81 mg total) by mouth daily.      Marland Kitchen ezetimibe (ZETIA) 10 MG tablet Take 10 mg by mouth every morning.      . hydrochlorothiazide (HYDRODIURIL) 25 MG tablet Take 25 mg by mouth daily.      . metoprolol succinate (TOPROL-XL) 50 MG 24 hr tablet Take 50 mg by mouth daily. Take with or immediately following a meal.      . nitroGLYCERIN (NITROSTAT) 0.4 MG SL tablet Place 1 tablet (0.4 mg total) under the tongue every 5 (five) minutes x 3 doses as needed for chest pain.  25 tablet  2  . olmesartan (BENICAR) 20 MG tablet Take 20 mg by mouth daily.      Marland Kitchen tetrahydrozoline 0.05 % ophthalmic solution Place 1 drop into both eyes 2 (two) times daily.      . pravastatin (PRAVACHOL) 40 MG tablet Take 40 mg by mouth at bedtime.       Scheduled: . fenofibrate  160 mg Oral Daily  . omega-3 acid ethyl esters  1 g Oral Daily    ROS: History obtained from the patient  General ROS: negative for - chills, fatigue, fever, night  sweats, weight gain or weight loss Psychological ROS: negative for - behavioral disorder, hallucinations, memory difficulties, mood swings or suicidal ideation Ophthalmic ROS: as noted per HPI ENT ROS: negative for - epistaxis, nasal discharge, oral lesions, sore throat, tinnitus or vertigo Allergy and Immunology ROS: negative for - hives or itchy/watery eyes Hematological and Lymphatic ROS: negative for - bleeding problems, bruising or swollen lymph nodes Endocrine ROS: negative for - galactorrhea, hair pattern changes, polydipsia/polyuria or temperature intolerance Respiratory ROS: negative for - cough, hemoptysis, shortness of breath or wheezing Cardiovascular ROS: negative for - chest pain, dyspnea on exertion, edema or irregular heartbeat Gastrointestinal ROS: negative  for - abdominal pain, diarrhea, hematemesis, nausea/vomiting or stool incontinence Genito-Urinary ROS: negative for - dysuria, hematuria, incontinence or urinary frequency/urgency Musculoskeletal ROS: negative for - joint swelling or muscular weakness Neurological ROS: as noted in HPI Dermatological ROS: negative for rash and skin lesion changes  Physical Examination: Blood pressure 116/85, pulse 71, temperature 98.1 F (36.7 C), temperature source Oral, resp. rate 18, height 6\' 1"  (1.854 m), weight 128.1 kg (282 lb 6.6 oz), SpO2 98.00%.  Neurologic Examination: Mental Status: Alert, oriented, thought content appropriate.  Speech fluent without evidence of aphasia.  Able to follow 3 step commands without difficulty. Cranial Nerves: II: Discs flat bilaterally; Right eye central scotoma, pupils equal, round, reactive to light and accommodation III,IV, VI: ptosis not present, extra-ocular motions intact bilaterally V,VII: smile symmetric, facial light touch sensation normal bilaterally VIII: hearing normal bilaterally IX,X: gag reflex present XI: bilateral shoulder shrug XII: midline tongue extension Motor: Right : Upper extremity   5/5    Left:     Upper extremity   5/5  Lower extremity   5/5     Lower extremity   5/5 Tone and bulk:normal tone throughout; no atrophy noted Sensory: Pinprick and light touch intact throughout, bilaterally Deep Tendon Reflexes: Symmetric throughout Plantars: Right: mute   Left: mute Cerebellar: normal finger-to-nose and normal heel-to-shin test Gait: not tested CV: pulses palpable throughout   Laboratory Studies:  Basic Metabolic Panel:  Recent Labs Lab 04/17/13 0925  NA 139  K 4.4  CL 103  CO2 25  GLUCOSE 118*  BUN 13  CREATININE 0.74  CALCIUM 9.7    Liver Function Tests: No results found for this basename: AST, ALT, ALKPHOS, BILITOT, PROT, ALBUMIN,  in the last 168 hours No results found for this basename: LIPASE, AMYLASE,  in the last  168 hours No results found for this basename: AMMONIA,  in the last 168 hours  CBC:  Recent Labs Lab 04/17/13 0925 04/18/13 0221  WBC 5.5 6.9  HGB 15.0 13.7  HCT 43.3 40.1  MCV 86.9 88.7  PLT 232 231    Cardiac Enzymes:  Recent Labs Lab 04/17/13 1507 04/17/13 2010 04/18/13 0221  TROPONINI <0.30 <0.30 <0.30    BNP: No components found with this basename: POCBNP,   CBG: No results found for this basename: GLUCAP,  in the last 168 hours  Microbiology: Results for orders placed during the hospital encounter of 04/17/13  MRSA PCR SCREENING     Status: None   Collection Time    04/17/13  1:30 PM      Result Value Range Status   MRSA by PCR NEGATIVE  NEGATIVE Final   Comment:            The GeneXpert MRSA Assay (FDA     approved for NASAL specimens     only), is one component of a  comprehensive MRSA colonization     surveillance program. It is not     intended to diagnose MRSA     infection nor to guide or     monitor treatment for     MRSA infections.    Coagulation Studies:  Recent Labs  04/18/13 0221  LABPROT 13.4  INR 1.04    Urinalysis: No results found for this basename: COLORURINE, APPERANCEUR, LABSPEC, PHURINE, GLUCOSEU, HGBUR, BILIRUBINUR, KETONESUR, PROTEINUR, UROBILINOGEN, NITRITE, LEUKOCYTESUR,  in the last 168 hours  Lipid Panel:    Component Value Date/Time   CHOL 201* 04/18/2013 0221   TRIG 585* 04/18/2013 0221   HDL 28* 04/18/2013 0221   CHOLHDL 7.2 04/18/2013 0221   VLDL UNABLE TO CALCULATE IF TRIGLYCERIDE OVER 400 mg/dL 1/61/0960 4540   LDLCALC UNABLE TO CALCULATE IF TRIGLYCERIDE OVER 400 mg/dL 9/81/1914 7829    FAOZ3Y:  Lab Results  Component Value Date   HGBA1C  Value: 6.0 (NOTE)                                                                       According to the ADA Clinical Practice Recommendations for 2011, when HbA1c is used as a screening test:   >=6.5%   Diagnostic of Diabetes Mellitus           (if abnormal result  is  confirmed)  5.7-6.4%   Increased risk of developing Diabetes Mellitus  References:Diagnosis and Classification of Diabetes Mellitus,Diabetes Care,2011,34(Suppl 1):S62-S69 and Standards of Medical Care in         Diabetes - 2011,Diabetes QMVH,8469,62  (Suppl 1):S11-S61.* 07/01/2010    Urine Drug Screen:   No results found for this basename: labopia, cocainscrnur, labbenz, amphetmu, thcu, labbarb    Alcohol Level: No results found for this basename: ETH,  in the last 168 hours   Imaging: Dg Chest 2 View  04/17/2013   CLINICAL DATA:  Chest pain, shortness of Breath  EXAM: CHEST  2 VIEW  COMPARISON:  07/06/2012  FINDINGS: The heart size and mediastinal contours are within normal limits. Both lungs are clear. The visualized skeletal structures are stable. Left coronary stent again noted.  IMPRESSION: No active cardiopulmonary disease.   Electronically Signed   By: Natasha Mead   On: 04/17/2013 10:33    Assessment: 47 y.o. male with visual changes after catheterization.  Patient describes a visual field loss that is consistent with a retinal artery branch occlusion in the right eye.  This is very likely procedure related.  NIHSS score low and patient therefore not a tPA candidate.    Stroke Risk Factors - hyperlipidemia, hypertension and CAD  Plan: 1. MRI, MRA  of the brain without contrast 2. Prophylactic therapy-patient to continue ASA 3. Telemetry monitoring 4. Frequent neuro checks   Thana Farr, MD [facility] [phone] 04/18/2013, 7:28 PM

## 2013-04-18 NOTE — Progress Notes (Signed)
    Subjective: Continues with chest pain, midsternal to back.  Morphine does improve the pain.  Currently 2/10.  Up bathing.  Symptoms like before with occlusive disease.     Objective: Vital signs in last 24 hours: Temp:  [97.6 F (36.4 C)-98.8 F (37.1 C)] 97.6 F (36.4 C) (09/15 0745) Pulse Rate:  [62-92] 62 (09/15 0745) Resp:  [8-25] 13 (09/15 0745) BP: (100-137)/(61-97) 108/87 mmHg (09/15 0745) SpO2:  [94 %-100 %] 98 % (09/15 0745) FiO2 (%):  [21 %] 21 % (09/14 1343) Weight:  [270 lb (122.471 kg)-282 lb 6.6 oz (128.1 kg)] 282 lb 6.6 oz (128.1 kg) (09/15 0350) Weight change:  Last BM Date: 04/17/13 Intake/Output from previous day: +672 09/14 0701 - 09/15 0700 In: 1272 [P.O.:720; I.V.:552] Out: 600 [Urine:600] Intake/Output this shift: Total I/O In: -  Out: 450 [Urine:450]  PE: General:Pleasant affect, NAD Skin:Warm and dry, brisk capillary refill HEENT:normocephalic, sclera clear, mucus membranes moist Heart:S1S2 RRR without murmur, gallup, rub or click Lungs:clear without rales, rhonchi, or wheezes Abd:soft, non tender, + BS, do not palpate liver spleen or masses Ext:no lower ext edema, 2+ pedal pulses, 2+ radial pulses Neuro:alert and oriented, MAE, follows commands, + facial symmetry   Lab Results:  Recent Labs  04/17/13 0925 04/18/13 0221  WBC 5.5 6.9  HGB 15.0 13.7  HCT 43.3 40.1  PLT 232 231   BMET  Recent Labs  04/17/13 0925  NA 139  K 4.4  CL 103  CO2 25  GLUCOSE 118*  BUN 13  CREATININE 0.74  CALCIUM 9.7    Recent Labs  04/17/13 2010 04/18/13 0221  TROPONINI <0.30 <0.30    Lab Results  Component Value Date   CHOL 201* 04/18/2013   HDL 28* 04/18/2013   LDLCALC UNABLE TO CALCULATE IF TRIGLYCERIDE OVER 400 mg/dL 04/18/2013   TRIG 585* 04/18/2013   CHOLHDL 7.2 04/18/2013   Lab Results  Component Value Date   HGBA1C  Value: 6.0 (NOTE)                                                                       According to the ADA  Clinical Practice Recommendations for 2011, when HbA1c is used as a screening test:   >=6.5%   Diagnostic of Diabetes Mellitus           (if abnormal result  is confirmed)  5.7-6.4%   Increased risk of developing Diabetes Mellitus  References:Diagnosis and Classification of Diabetes Mellitus,Diabetes Care,2011,34(Suppl 1):S62-S69 and Standards of Medical Care in         Diabetes - 2011,Diabetes Care,2011,34  (Suppl 1):S11-S61.* 07/01/2010     Lab Results  Component Value Date   TSH 0.999 07/01/2010    Hepatic Function Panel No results found for this basename: PROT, ALBUMIN, AST, ALT, ALKPHOS, BILITOT, BILIDIR, IBILI,  in the last 72 hours  Recent Labs  04/18/13 0221  CHOL 201*   No results found for this basename: PROTIME,  in the last 72 hours    Studies/Results: Dg Chest 2 View  04/17/2013   CLINICAL DATA:  Chest pain, shortness of Breath  EXAM: CHEST  2 VIEW  COMPARISON:  07/06/2012  FINDINGS: The heart size and mediastinal contours are within   normal limits. Both lungs are clear. The visualized skeletal structures are stable. Left coronary stent again noted.  IMPRESSION: No active cardiopulmonary disease.   Electronically Signed   By: Liviu  Pop   On: 04/17/2013 10:33    Medications: I have reviewed the patient's current medications. Scheduled Meds: . aspirin  324 mg Oral Pre-Cath  . aspirin  81 mg Oral Daily  . ezetimibe  10 mg Oral q morning - 10a  . hydrochlorothiazide  25 mg Oral Daily  . irbesartan  150 mg Oral Daily  . metoprolol succinate  50 mg Oral Daily  . simvastatin  20 mg Oral q1800  . sodium chloride  3 mL Intravenous Q12H  . sodium chloride  3 mL Intravenous Q12H   Continuous Infusions: . sodium chloride 1 mL/kg/hr (04/18/13 0400)  . heparin 1,700 Units/hr (04/17/13 2200)   PRN Meds:.sodium chloride, sodium chloride, acetaminophen, morphine injection, nitroGLYCERIN, ondansetron (ZOFRAN) IV, sodium chloride, sodium chloride,  zolpidem  Assessment/Plan: Principal Problem:   Unstable angina, cresendo  Active Problems:   CAD, RCA X 3 ION DES 07/01/2010, ISR -06/23/12    Dyslipidemia, statin intol   HTN (hypertension)  PLAN:  Negative MI, negative Pro BNP, No acute EKG changes  TG elevated at 585,  HgB A1C is 6.0.   On Zetia and Pravachol.  Plan for cardiac cath today.  Add fenofibrate, fish oil. Will have diabetes coordinator see the pt.   LOS: 1 day   Time spent with pt. :15 minutes. INGOLD,LAURA R  Nurse Practitioner Certified Pager [phone] 04/18/2013, 8:50 AM   Patient seen and examined. Agree with assessment and plan. Very pleasant 47 yo WM who is s/p initial stenting to RCA in 11/ 2013. He developed late restenosis in 07/2012 and underwent cutting balloon PCI. He now is readmitted with recurrent symprtoms similar to December. Enzymes are negative. Lipid panel is c/w atherogenic dyslipidemic profile, suspect hyperinsulinemia and metabolic syndrome. Discussed cath with potential intervention. He had failed attempt at radial poecedure previously due to "loop," plan femorally. Add fenofibrate and lovaza with TG >500.   Chipper Koudelka A. Deslyn Cavenaugh, MD, FACC 04/18/2013 9:15 AM   

## 2013-04-18 NOTE — Progress Notes (Signed)
ANTICOAGULATION CONSULT NOTE - Follow Up Consult  Pharmacy Consult for heparin Indication: chest pain/ACS  Allergies  Allergen Reactions  . Ace Inhibitors Cough  . Lipitor [Atorvastatin] Other (See Comments)    myalgia  . Codeine Itching    Patient Measurements: Height: 6\' 1"  (185.4 cm) Weight: 282 lb 6.6 oz (128.1 kg) IBW/kg (Calculated) : 79.9 Heparin Dosing Weight: 107kg  Vital Signs: Temp: 98.1 F (36.7 C) (09/15 1154) Temp src: Oral (09/15 1154) BP: 125/85 mmHg (09/15 1154) Pulse Rate: 73 (09/15 1154)  Labs:  Recent Labs  04/17/13 0925 04/17/13 1507 04/17/13 2010 04/18/13 0221 04/18/13 1120  HGB 15.0  --   --  13.7  --   HCT 43.3  --   --  40.1  --   PLT 232  --   --  231  --   LABPROT  --   --   --  13.4  --   INR  --   --   --  1.04  --   HEPARINUNFRC  --   --  0.18* 0.36 0.42  CREATININE 0.74  --   --   --   --   TROPONINI  --  <0.30 <0.30 <0.30  --     Estimated Creatinine Clearance: 158.4 ml/min (by C-G formula based on Cr of 0.74).   Medications:  Scheduled:  . aspirin  324 mg Oral Pre-Cath  . aspirin  81 mg Oral Daily  . ezetimibe  10 mg Oral q morning - 10a  . fenofibrate  160 mg Oral Daily  . hydrochlorothiazide  25 mg Oral Daily  . irbesartan  150 mg Oral Daily  . metoprolol succinate  50 mg Oral Daily  . omega-3 acid ethyl esters  1 g Oral Daily  . simvastatin  20 mg Oral q1800  . sodium chloride  3 mL Intravenous Q12H  . sodium chloride  3 mL Intravenous Q12H   Infusions:  . sodium chloride 1 mL/kg/hr (04/18/13 0400)  . heparin 1,700 Units/hr (04/17/13 2200)    Assessment: 47 yo male with [place] on heparin at 1700 units/hr and noted at goal (HL= 0.42).  Patient noted for cath today.  Goal of Therapy:  Heparin level 0.3-0.7 units/ml Monitor platelets by anticoagulation protocol: Yes   Plan:  -No heparin changes needed -Will follow post cath  Harland German, Pharm D 04/18/2013 1:28 PM

## 2013-04-18 NOTE — Interval H&P Note (Signed)
Cath Lab Visit (complete for each Cath Lab visit)  Clinical Evaluation Leading to the Procedure:   ACS: no  Non-ACS:    Anginal Classification: CCS III  Anti-ischemic medical therapy: Maximal Therapy (2 or more classes of medications)  Non-Invasive Test Results: No non-invasive testing performed  Prior CABG: No previous CABG      History and Physical Interval Note:  04/18/2013 9:20 AM  Dylan [name]  has presented today for surgery, with the diagnosis of cp  The various methods of treatment have been discussed with the patient and family. After consideration of risks, benefits and other options for treatment, the patient has consented to  Procedure(s): LEFT HEART CATHETERIZATION WITH CORONARY ANGIOGRAM (N/A) as a surgical intervention .  The patient's history has been reviewed, patient examined, no change in status, stable for surgery.  I have reviewed the patient's chart and labs.  Questions were answered to the patient's satisfaction.     [name] A

## 2013-04-18 NOTE — Progress Notes (Signed)
Called by RN that pt's original migraine symptoms has resolved, but now with check mark spot in his Rt. eye.  Hx. Of CVA in 2011 post cath.  I ordered stat CT of head without contrast and we called code stroke.    Last CVA he had expressive aphasia.  Tonight he stated he had selective aphasia and dyslexia residual from that CVA.    VS have been stable, on arriving in holding area he developed his usual wavy line aura with migraines but also a check mark in rt eye.  With medication the wavy line aura resolved.  But the check mark area remained. This all began approx 1500.  Neuro has been and examined the pt.  Awaiting CT results.  Discharge cancelled.  Will hold food until Dr. Thad Ranger has completed exam.  CT head shows no acute episode. No bleed.   General:Pleasant affect, NAD Skin:Warm and dry, brisk capillary refill HEENT:normocephalic, sclera clear, mucus membranes moist Heart:S1S2 RRR without murmur, gallup, rub or click Lungs:clear without rales, rhonchi, or wheezes XBJ:YNWG, non tender, + BS, do not palpate liver spleen or masses Ext:no lower ext edema, 2+ pedal pulses, 2+ radial pulses Neuro:alert and oriented, MAE, follows commands, + facial symmetry      Now with headache,  Will give morphine.

## 2013-04-18 NOTE — Discharge Summary (Signed)
Physician Discharge Summary  Patient ID: Dylan [name] MRN: [id number] DOB/AGE: [birth year] 47 y.o.  Admit date: 04/17/2013 Discharge date: 04/18/2013  Admission Diagnoses: Unstable Angina  Discharge Diagnoses:  Principal Problem:   Unstable angina, cresendo  Active Problems:   CAD, RCA X 3 ION DES 07/01/2010, ISR -06/23/12    Dyslipidemia, statin intol   HTN (hypertension)   Discharged Condition: stable  Hospital Course: The patient is a 47 y/o male with known CAD, followed at [facility] by Dr. Royann Shivers. He was hospitalized several months ago from July 06, 2012 to July 07, 2012, for unstable angina. At that time, he underwent diagnostic cardiac catheterization and AngioSculpt intervention to the RCA for in-stent restenosis. He had had a previous RCA drug-eluting stent placed November 2011. He tolerated this well. His troponins were negative. He was put on Brilinta and aspirin. He had some complaints of chest discomfort after the stent which were felt to be musculoskeletal. He presented back to the [facility] ER on 04/17/13 with complaints if increasing dyspnea on exertion and associated chest pressure, with radiation to the left arm and jaw. His symptoms were similar to what he experienced in 2013 when he had cutting balloon angioplasty to the RCA. His EKG was unchanged compared to prior study and demonstrated no acute changes. Given his history, he was admitted for observation and to rule-out MI. He was placed on IV heparin and IV NTG. His pain resolved. Cardiac enzymes were cycled and were negative x 3. He was noted to have markedly elevated TG (585) and low HDL (28). A cardiac cath was recommended. The procedure was performed by Dr. Tresa Endo via the right femoral artery. He was found to have no significant restenosis of the RCA stented segment with smooth internal narrowing of 20 to less than 30% in the region of prior Cutting Balloon procedure. He had mild luminal irregularities of the LAD and left  circumflex without significant obstruction. He had preserved LV function with an EF of 55% with minimal residual focal mid inferior hypocontractility. He left the cath lab in stable condition. Medical therapy was recommended, with aggressive lipid intervention, as his atherogenic dyslipidemic profile was markedly elevated. He was started on fenofibrate and Lovaza. His Zetia was resumed, as well as his ASA, BB, ARB, and statin. He was kept several hours of observation and hydration. He had no post-operative complications. The right femoral access site remained stable. He was last seen and examined by Dr. Tresa Endo, who determined he was stable for discharge home. He will follow-up with    Consults: None  Significant Diagnostic Studies:   Lipid Panel 04/18/13    Component Value Date/Time   CHOL 201* 04/18/2013 0221   TRIG 585* 04/18/2013 0221   HDL 28* 04/18/2013 0221   CHOLHDL 7.2 04/18/2013 0221   VLDL UNABLE TO CALCULATE IF TRIGLYCERIDE OVER 400 mg/dL 9/56/2130 8657   LDLCALC UNABLE TO CALCULATE IF TRIGLYCERIDE OVER 400 mg/dL 8/46/9629 5284    Left Heart Cath 04/18/13 HEMODYNAMICS:  Central Aorta: 110/69  Left Ventricle: 110/15  ANGIOGRAPHY:  1. Left main: Normal and bifurcated into an LAD and left circumflex system.  2. LAD: Very mild calcification with mild luminal regularities without significant obstruction. The vessel gave rise to 2 diagonal vessels and several septal perforating arteries.  3. Left circumflex: Mild luminal regularities without significant obstruction.  4. Right coronary artery: Long area of tandem stenting extending osculate to the mid segment. In the region of the prior angioscope Cutting Balloon it was smooth  residual narrowing of 20 to less than 30% in the mid RCA after the anterior artery marginal takeoff. No significant stenoses were demonstrated.  5. Left ventriculography revealed normal LV contractility with ejection fraction of 55%. Was a minimal region of residual  mild inferior hypocontractility relative to the other walls.   Treatments: See Hospital Course  Discharge Exam: Blood pressure 125/85, pulse 61, temperature 98.1 F (36.7 C), temperature source Oral, resp. rate 15, height 6\' 1"  (1.854 m), weight 282 lb 6.6 oz (128.1 kg), SpO2 96.00%.   Disposition: 01-Home or Self Care      Discharge Orders   Future Orders Complete By Expires   Diet - low sodium heart healthy  As directed    Driving Restrictions  As directed    Comments:     No driving for 3 days   Increase activity slowly  As directed    Lifting restrictions  As directed    Comments:     No lifting more than 1/2 gallon of milk for 3 days       Medication List         aspirin 81 MG chewable tablet  Chew 1 tablet (81 mg total) by mouth daily.     ezetimibe 10 MG tablet  Commonly known as:  ZETIA  Take 10 mg by mouth every morning.     fenofibrate 160 MG tablet  Take 1 tablet (160 mg total) by mouth daily.     hydrochlorothiazide 25 MG tablet  Commonly known as:  HYDRODIURIL  Take 25 mg by mouth daily.     metoprolol succinate 50 MG 24 hr tablet  Commonly known as:  TOPROL-XL  Take 50 mg by mouth daily. Take with or immediately following a meal.     nitroGLYCERIN 0.4 MG SL tablet  Commonly known as:  NITROSTAT  Place 1 tablet (0.4 mg total) under the tongue every 5 (five) minutes x 3 doses as needed for chest pain.     olmesartan 20 MG tablet  Commonly known as:  BENICAR  Take 20 mg by mouth daily.     omega-3 acid ethyl esters 1 G capsule  Commonly known as:  LOVAZA  Take 1 capsule (1 g total) by mouth daily.     pravastatin 40 MG tablet  Commonly known as:  PRAVACHOL  Take 40 mg by mouth at bedtime.     tetrahydrozoline 0.05 % ophthalmic solution  Place 1 drop into both eyes 2 (two) times daily.       Follow-up Information   Follow up with [facility]. (our office will call you with an appoitment)    Contact information:    [phone]      TIME SPENT ON DISCHARGE, INCLUDING PHYSICIAN TIME: > 30 MINUTES  Signed: Allayne Butcher, PA-C 04/18/2013, 4:30 PM

## 2013-04-18 NOTE — Progress Notes (Signed)
1850 Patient reported no change in visual black nike shaped spot in middle of right vision. No numbness or weakness noted. MAE.  Speech clear. No other neuro deficits noted. Nada Boozer NP notified and CT scan ordered. CT notified of stat CT ordered.

## 2013-04-18 NOTE — Progress Notes (Signed)
Report to Donn Pierini.

## 2013-04-18 NOTE — Progress Notes (Signed)
ANTICOAGULATION CONSULT NOTE - Follow Up Consult  Pharmacy Consult for heparin Indication: chest pain/ACS  Labs:  Recent Labs  04/17/13 0925 04/17/13 1507 04/17/13 2010 04/18/13 0221  HGB 15.0  --   --  13.7  HCT 43.3  --   --  40.1  PLT 232  --   --  231  LABPROT  --   --   --  13.4  INR  --   --   --  1.04  HEPARINUNFRC  --   --  0.18* 0.36  CREATININE 0.74  --   --   --   TROPONINI  --  <0.30 <0.30  --     Assessment/Plan:  47yo male now therapeutic on heparin after rate increase.  Will continue gtt at current rate and confirm stable with additional level.  Vernard Gambles, PharmD, BCPS  04/18/2013,3:20 AM

## 2013-04-18 NOTE — CV Procedure (Signed)
Dylan [name] is a 47 y.o. male    [id number]  [id number] LOCATION:  FACILITY: [facility]  PHYSICIAN: Lennette Bihari, MD, [facility] 06/21/1966   DATE OF PROCEDURE:  04/18/2013    [facility]  CARDIAC CATHETERIZATION     HISTORY:  Dylan [name] is a 47 year old white male who underwent insertion of 3 drug-eluting stents in his right coronary artery in November 2011. In December 2013 he was found to have focal in stent restenosis in the region just beyond the artery marginal takeoff and underwent successful angioscope Cutting Balloon PCI. He has done well since. 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 He developed recent recurrent chest pain. In some instances the chest pain was similar as previous but actually did not improve and got worse with nitroglycerin per was admitted to [facility]  hospital yesterday. Cardiac enzymes are negative. He presents now for repeat cardiac catheterization to assess for restenosis   PROCEDURE:  The patient was brought to the second floor [place] [facility] cath lab in the postabsorptive state. He was premedicated with Versed as initially 2 mg and fentanyl 50 mcg but received an additional Versed 1 mg and fentanyl 25 mcg for additional sedation. His  Vicryl was prepped and shaved in usual sterile fashion. Xylocaine 1% was used for local anesthesia. A 5 French sheath was inserted into the right femoral artery. Diagnostic catheterizatiion was done with 5 [place] FL4 and FR5, FR4, and pigtail catheters. Left ventriculography was done with 25 cc Omnipaque contrast. Hemostasis was obtained by direct manual compression. The patient tolerated the procedure well.   HEMODYNAMICS:   Central Aorta: 110/69   Left Ventricle: 110/15  ANGIOGRAPHY:  1. Left main: Normal and bifurcated into an LAD and left circumflex system. 2. LAD: Very mild calcification with mild luminal regularities without significant obstruction. The vessel  gave rise to 2 diagonal vessels and several septal perforating arteries. 3. Left circumflex: Mild luminal regularities without significant obstruction.  4. Right coronary artery: Long area of tandem stenting extending osculate to the mid segment. In the region of the prior angioscope Cutting Balloon it was smooth residual narrowing of 20 to less than 30% in the mid RCA after the anterior artery marginal takeoff. No significant stenoses were demonstrated.  5.  Left ventriculography revealed normal LV contractility with ejection fraction of 55%. Was a minimal region of residual mild inferior hypocontractility relative to the other walls.   IMPRESSION:  Preserved LV function with an ejection fraction of 55% with minimal residual focal mid inferior hypocontractility. No significant restenosis of the right coronary artery stented segment with smooth internal narrowing of 20 to less than 30% in the region of prior Cutting Balloon procedure. Mild luminal irregularities of the LAD and left circumflex without significant obstruction.  RECOMMENDATIONS:  Medical Therapy; aggressive lipid intervention with atherogenic dyslipidemic profile with markedly elevated TG and low HDL.   Lennette Bihari, MD, [facility] 04/18/2013 3:08 PM

## 2013-04-18 NOTE — Progress Notes (Signed)
Discussed with Ms. Ingold.  Calling Neurology is appropriate COA.  Will defer evaluation & further Rx to their expertise.  Marykay Lex, MD

## 2013-04-18 NOTE — Progress Notes (Signed)
1625 Patient reported aura "squiggly lines" in peripheral vision has resolved and headache almost gone. Reports seeing a black spot in the shape of a nike sign in the middle of his right vision only. Residual from old stroke in 2011 is selective aphasia and dyslexia. Reported symptoms with his last stroke in 2011 was aphasia," I wasn't able to talk". 1645 Brittainy Simmons PA into see patient and evaluate symptoms. Plan is to discharge later today, if no complications. Patient instructed to report any changes.

## 2013-04-18 NOTE — Progress Notes (Signed)
Inpatient Diabetes Program Recommendations  AACE/ADA: New Consensus Statement on Inpatient Glycemic Control (2013)  Target Ranges:  Prepandial:   less than 140 mg/dL      Peak postprandial:   less than 180 mg/dL (1-2 hours)      Critically ill patients:  140 - 180 mg/dL   Results for ODES, LOLLI (MRN [id number]) as of 04/18/2013 13:28  Ref. Range 07/01/2010 09:38  Hemoglobin A1C Latest Range: <5.7 % 6.0... (H)    Inpatient Diabetes Program Recommendations HgbA1C: order new A1C and monitor CBGs TID during hospitalization Last A1C recorded is from 2011.  Will likely need lifestyle modification through diet and exercise.   Recommend OP education at the [facility] once discharged from hospital.  Will follow. Thank you  Piedad Climes BSN, RN,CDE Inpatient Diabetes Coordinator [phone] (team pager)

## 2013-04-19 ENCOUNTER — Inpatient Hospital Stay (HOSPITAL_COMMUNITY): Payer: Managed Care, Other (non HMO)

## 2013-04-19 DIAGNOSIS — H34231 Retinal artery branch occlusion, right eye: Secondary | ICD-10-CM | POA: Diagnosis not present

## 2013-04-19 DIAGNOSIS — E781 Pure hyperglyceridemia: Secondary | ICD-10-CM | POA: Diagnosis present

## 2013-04-19 DIAGNOSIS — I200 Unstable angina: Secondary | ICD-10-CM

## 2013-04-19 DIAGNOSIS — H34239 Retinal artery branch occlusion, unspecified eye: Secondary | ICD-10-CM

## 2013-04-19 DIAGNOSIS — H5461 Unqualified visual loss, right eye, normal vision left eye: Secondary | ICD-10-CM | POA: Diagnosis not present

## 2013-04-19 DIAGNOSIS — Z8673 Personal history of transient ischemic attack (TIA), and cerebral infarction without residual deficits: Secondary | ICD-10-CM

## 2013-04-19 DIAGNOSIS — I2510 Atherosclerotic heart disease of native coronary artery without angina pectoris: Secondary | ICD-10-CM

## 2013-04-19 HISTORY — DX: Unqualified visual loss, right eye, normal vision left eye: H54.61

## 2013-04-19 LAB — BASIC METABOLIC PANEL
Calcium: 9 mg/dL (ref 8.4–10.5)
GFR calc Af Amer: >90 mL/min (ref >90)
GFR calc non Af Amer: >90 mL/min (ref >90)
Glucose, Bld: 110 mg/dL — ABNORMAL HIGH (ref 70–99)
Potassium: 3.9 mEq/L (ref 3.5–5.1)
Sodium: 139 mEq/L (ref 135–145)

## 2013-04-19 LAB — HEMOGLOBIN A1C
Hgb A1c MFr Bld: 6.3 % — ABNORMAL HIGH (ref <5.7)
Mean Plasma Glucose: 134 mg/dL — ABNORMAL HIGH (ref <117)

## 2013-04-19 LAB — CBC
MCH: 30.5 pg (ref 26.0–34.0)
MCHC: 34.8 g/dL (ref 30.0–36.0)
Platelets: 227 10*3/uL (ref 150–400)
RDW: 13.3 % (ref 11.5–15.5)

## 2013-04-19 MED ORDER — LORAZEPAM 2 MG/ML IJ SOLN
INTRAMUSCULAR | Status: AC
  Administered 2013-04-19: 1 mg via INTRAVENOUS
  Filled 2013-04-19: qty 1

## 2013-04-19 MED ORDER — METOPROLOL SUCCINATE ER 25 MG PO TB24
25.0000 mg | ORAL_TABLET | Freq: Every day | ORAL | Status: DC
  Filled 2013-04-19: qty 1

## 2013-04-19 MED ORDER — METOPROLOL SUCCINATE ER 25 MG PO TB24: 25.0000 mg | ORAL_TABLET | Freq: Every day | ORAL | Status: DC

## 2013-04-19 MED ORDER — LORAZEPAM 2 MG/ML IJ SOLN
1.0000 mg | Freq: Once | INTRAMUSCULAR | Status: AC
  Administered 2013-04-19: 1 mg via INTRAVENOUS

## 2013-04-19 MED ORDER — LORAZEPAM 2 MG/ML IJ SOLN
0.5000 mg | Freq: Once | INTRAMUSCULAR | Status: AC
  Administered 2013-04-19: 0.5 mg via INTRAVENOUS

## 2013-04-19 MED ORDER — ACETAMINOPHEN 325 MG PO TABS: 650.0000 mg | ORAL_TABLET | ORAL | Status: DC | PRN

## 2013-04-19 MED ORDER — ASPIRIN 81 MG PO CHEW
81.0000 mg | CHEWABLE_TABLET | Freq: Every day | ORAL | Status: DC
  Filled 2013-04-19: qty 1

## 2013-04-19 MED ORDER — LORAZEPAM 2 MG/ML IJ SOLN
INTRAMUSCULAR | Status: AC
  Administered 2013-04-19: 0.5 mg via INTRAVENOUS
  Filled 2013-04-19: qty 1

## 2013-04-19 NOTE — Progress Notes (Signed)
MRI without acute findings, carotid dopplers without significant stenosis. Discharged on decreased dose of beta blocker secondary to bradycardia. Follow up as scheduled.  Corine Shelter PA-C 04/19/2013 5:10 PM

## 2013-04-19 NOTE — Progress Notes (Signed)
Call from MRI, patient unable to be still during MRI, notified [place], [place], news orders received, went to MRI to administer Ativan 0.5mg  IV per order @ 1440. Returned to floor received another call from MRI at 1500, patient still unable to lay still, notified [place], [place] again, new orders received.  Went back to MRI to administer Ativan 1.0 mg IV per order, stayed in MRI with patient, patient able to have MRI.

## 2013-04-19 NOTE — Progress Notes (Signed)
Planned to follow-up regarding possible need for Outpatient Education regarding Diabetes if Hgb A1C confirms a diagnosis.  Hgb A1C results not available yet.  Note that patient has been refusing various tests, etc. so will not discuss issue with him in an effort not to further agitate him.  Will be glad to arrange for Outpatient Diabetes Education follow-up if diagnosis of diabetes confirmed.  (If patient possibly has Pre-Diabetes, could be referred as well.)  Will await call from nurse or MD before I discuss anything related to glycemic control with him or order outpatient follow-up diabetes education.     Thank you.  Horton Ellithorpe S. Elsie Lincoln, RN, CNS, CDE Inpatient Diabetes Program, team pager [phone]

## 2013-04-19 NOTE — Progress Notes (Signed)
Pt refused to have his blood work taken this A.M. Explained to the pt the importance of blood work. Pt states he is anticipating to be discharge today & says he does not need it. Spoke to the pt also about his order for MRI. He also refused at this moment & says he will decide later. Will endorse to the next shift to follow-up pt's decision for MRI.

## 2013-04-19 NOTE — Progress Notes (Signed)
Subjective:  Code Stroke called last PM for R eye scotoma -- Per Neurology, concerning for R Retinal A Branch CVA; pt. Refused MRI/A  last PM - wants to talk with MD 1st). -- vision is notably better; now the "swoosh" is greyish, but see through as opposed to black" Labs pendingg  Objective:  Vital Signs in the last 24 hours: Temp:  [98.1 F (36.7 C)-98.7 F (37.1 C)] 98.7 F (37.1 C) (09/16 0415) Pulse Rate:  [60-82] 62 (09/16 0415) Resp:  [15-18] 16 (09/16 0415) BP: (94-137)/(49-93) 119/68 mmHg (09/16 0415) SpO2:  [96 %-99 %] 98 % (09/16 0415) Weight:  [285 lb 4.4 oz (129.4 kg)] 285 lb 4.4 oz (129.4 kg) (09/16 0545)  Intake/Output from previous day: 09/15 0701 - 09/16 0700 In: 2372 [P.O.:360; I.V.:2012] Out: 1700 [Urine:1700] Intake/Output from this shift:   Physical Exam: General appearance: alert, cooperative, appears stated age and no distress Neck: no adenopathy, no carotid bruit, no JVD and supple, symmetrical, trachea midline Lungs: clear to auscultation bilaterally, normal percussion bilaterally and non-labored Heart: regular rate and rhythm, S1, S2 normal, no murmur, click, rub or gallop and normal apical impulse Abdomen: soft, non-tender; bowel sounds normal; no masses,  no organomegaly Extremities: extremities normal, atraumatic, no cyanosis or edema Pulses: 2+ and symmetric Neurologic: Cranial nerves: normal; visual field deficit - R eye @ ~8 O'clock radiating outwards; notably improved  Lab Results:  Recent Labs  04/17/13 0925 04/18/13 0221  WBC 5.5 6.9  HGB 15.0 13.7  PLT 232 231    Recent Labs  04/17/13 0925  NA 139  K 4.4  CL 103  CO2 25  GLUCOSE 118*  BUN 13  CREATININE 0.74    Recent Labs  04/17/13 2010 04/18/13 0221  TROPONINI <0.30 <0.30   Hepatic Function Panel No results found for this basename: PROT, ALBUMIN, AST, ALT, ALKPHOS, BILITOT, BILIDIR, IBILI,  in the last 72 hours  Recent Labs  04/18/13 0221  CHOL 201*   No results  found for this basename: PROTIME,  in the last 72 hours  Imaging: Imaging results have been reviewed - CT Head  Cardiac Studies:  Assessment/Plan:  Principal Problem:   Unstable angina, cresendo  Active Problems:   Visual loss, right eye - R Retinal A Branch CVA   Retinal artery branch occlusion of right eye = post catheterization   Hypertriglyceridemia - TG >500   CAD, RCA X 3 ION DES 07/01/2010, ISR -06/23/12    Dyslipidemia, statin intol   HTN (hypertension)  Thankfully, cardiac catheterization did not show macrovascular disease.  Plan is to optimize medical Rx (most medications are currently on hold, but will restart BB dose today & ASA per Neurology -- as BP normalizes, will need to add back ARB) With TG > 500 -- statin + Zetia on hold - Fibrate + Lovaza started, until TG<200.  New R Retinal A Branch CVA - would like to have more data; Neurology is on board, ASA ordered.  He currently is refusing MRI due to concern about how a CVA might affect; at this point, I am waiting for Neurology f/u to determine his status.  HA gone.  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  BP looks good (meds were given yesterday). -- will probably be able to simply restart home BP meds on d/c.  With elevated CBG - Hgb A1c checked.  Pending Neurology evaluation & MRI/A results -  would otherwise be stable for d/c from a cardiac standpoint.  ? If we need to monitor 1 more day prior to d/c.    LOS: 2 days    Dylan [name] W 04/19/2013, 8:47 AM

## 2013-04-19 NOTE — Discharge Summary (Signed)
Pt was not discharged as scheduled due to New onset Neurologic findings concerning for CVA.  Code Stroke called.  Marykay Lex, MD

## 2013-04-19 NOTE — Progress Notes (Signed)
Utilization Review Completed Avarey Yaeger J. Aaminah Forrester, RN, BSN, NCM [phone]  

## 2013-04-19 NOTE — Progress Notes (Addendum)
Stroke Team Progress Note  HISTORY Dylan [name] is an 47 y.o. male who underwent a cardiac cath today 04/18/2013. When he became aware of things in the holding area he began to notice a visual scotoma develop in the right visual field which is quite common prior to the development of his migraines. Today after this developed there also developed a crescent shaped area of loss of vision in the right eye. The left eye has no visual changes. The patient made nursing aware and a code stroke was called. Initial NIHSS of 0. He was last known well at 1500. Patient was not a TPA candidate secondary to  Outside time window, low NIHSS. He was admitted for further evaluation and treatment.  SUBJECTIVE His mother, Dylan [name] is at the bedside.  Overall he feels his condition is gradually improving. He has a significant history of migraine. Patient concerned about testing as he feels sx only a result of the procedure. Long discussion between pt and Dr. Marjory Lies related to symtpoms and etiology. Pt agreeable to further testing.  OBJECTIVE Most recent Vital Signs: Filed Vitals:   04/18/13 2300 04/19/13 0031 04/19/13 0415 04/19/13 0545  BP:  101/49 119/68   Pulse:  65 62   Temp: 98.3 F (36.8 C) 98.1 F (36.7 C) 98.7 F (37.1 C)   TempSrc:  Oral Oral   Resp:  18 16   Height:      Weight:    129.4 kg (285 lb 4.4 oz)  SpO2:  97% 98%    CBG (last 3)  No results found for this basename: GLUCAP,  in the last 72 hours  IV Fluid Intake:   . sodium chloride 150 mL/hr at 04/19/13 0000    MEDICATIONS  . aspirin  81 mg Oral Daily  . fenofibrate  160 mg Oral Daily  . metoprolol succinate  25 mg Oral Daily  . omega-3 acid ethyl esters  1 g Oral Daily   PRN:  acetaminophen, morphine injection, morphine injection, ondansetron (ZOFRAN) IV  Diet:  Cardiac thin liquids Activity:  OOB with assistance DVT Prophylaxis:  ambulatory  CLINICALLY SIGNIFICANT STUDIES Basic Metabolic Panel:  Recent Labs Lab  04/17/13 0925 04/19/13 0900  NA 139 139  K 4.4 3.9  CL 103 102  CO2 25 29  GLUCOSE 118* 110*  BUN 13 11  CREATININE 0.74 0.90  CALCIUM 9.7 9.0   Liver Function Tests: No results found for this basename: AST, ALT, ALKPHOS, BILITOT, PROT, ALBUMIN,  in the last 168 hours CBC:  Recent Labs Lab 04/18/13 0221 04/19/13 0900  WBC 6.9 5.9  HGB 13.7 14.5  HCT 40.1 41.7  MCV 88.7 87.6  PLT 231 227   Coagulation:  Recent Labs Lab 04/18/13 0221  LABPROT 13.4  INR 1.04   Cardiac Enzymes:  Recent Labs Lab 04/17/13 1507 04/17/13 2010 04/18/13 0221  TROPONINI <0.30 <0.30 <0.30   Urinalysis: No results found for this basename: COLORURINE, APPERANCEUR, LABSPEC, PHURINE, GLUCOSEU, HGBUR, BILIRUBINUR, KETONESUR, PROTEINUR, UROBILINOGEN, NITRITE, LEUKOCYTESUR,  in the last 168 hours Lipid Panel    Component Value Date/Time   CHOL 201* 04/18/2013 0221   TRIG 585* 04/18/2013 0221   HDL 28* 04/18/2013 0221   CHOLHDL 7.2 04/18/2013 0221   VLDL UNABLE TO CALCULATE IF TRIGLYCERIDE OVER 400 mg/dL 1/61/0960 4540   LDLCALC UNABLE TO CALCULATE IF TRIGLYCERIDE OVER 400 mg/dL 9/81/1914 7829   FAOZ3Y  Lab Results  Component Value Date   HGBA1C  Value: 6.0 (NOTE)  According to the ADA Clinical Practice Recommendations for 2011, when HbA1c is used as a screening test:   >=6.5%   Diagnostic of Diabetes Mellitus           (if abnormal result  is confirmed)  5.7-6.4%   Increased risk of developing Diabetes Mellitus  References:Diagnosis and Classification of Diabetes Mellitus,Diabetes Care,2011,34(Suppl 1):S62-S69 and Standards of Medical Care in         Diabetes - 2011,Diabetes ZOXW,9604,54  (Suppl 1):S11-S61.* 07/01/2010    Urine Drug Screen:   No results found for this basename: labopia, cocainscrnur, labbenz, amphetmu, thcu, labbarb    Alcohol Level: No results found for this basename: ETH,  in the last 168 hours   CT of the  brain  04/18/2013  1. Low-attenuation in the cortex and subcortical white matter of the left temporal and parietal regions, concerning for probable subacute ischemia. Correlation with MRI may be warranted. 2. Old lacunar infarction in the inferior aspect of the left putamen.  MRI of the brain    MRA of the brain    2D Echocardiogram    Carotid Doppler    CXR    EKG  normal sinus rhythm  Therapy Recommendations no therapy needs  Physical Exam   GENERAL EXAM: Patient is in no distress  CARDIOVASCULAR: Regular rate and rhythm, no murmurs, no carotid bruits  NEUROLOGIC: MENTAL STATUS: awake, alert, language fluent, comprehension intact, naming intact CRANIAL NERVE: pupils equal and reactive to light, visual fields notable for small, paracentral visual disturbance in right eye, just below midposition. Left eye field normal.  full to confrontation, extraocular muscles intact, no nystagmus, facial sensation and strength symmetric, uvula midline, shoulder shrug symmetric, tongue midline. MOTOR: normal bulk and tone, full strength in the BUE, BLE SENSORY: normal and symmetric to light touch, pinprick, temperature, vibration and proprioception COORDINATION: finger-nose-finger, fine finger movements, heel-shin normal REFLEXES: deep tendon reflexes present and symmetric   ASSESSMENT [name] is a 47 y.o. male presenting with right eye vision loss following a cardiac cath, likely branch retinal artery occlusion. On aspirin 81 mg orally every day prior to admission. Now on aspirin 81 mg orally every day for secondary stroke prevention. Patient with resultant decreased vision in right eye, which is improving. Work up underway.   Hx Left MCA branch infarct s/p cardiac cath w/ aphasia that has significantly improved Nov 2011  Obesity, Body mass index is 37.65 kg/(m^2). Hypertension  Hyperlipidemia, LDL unable to calculate due to high triglycertides, on zetia and pravachol 40 mg   PTA, now on no statin currently, goal LDL < 100 (< 70 for diabetics)   Hospital day # 2  TREATMENT/PLAN  Consider changing aspirin to plavix, but patient hesitant. For now, continue aspirin 81 mg orally every day for secondary stroke prevention. Will defer discussion further with cardiologist,  Check MRI, MRA of the brain, carotid doppler  If MRI show acute embolic stroke, may consider the need for 2D echo (EF 55% per cath yesterday, but did not look at valves)  If MRI negative, Carotid doppler negative, ok for discharge from stroke standpoint  Annie Main, MSN, RN, ANVP-BC, ANP-BC, GNP-BC [facility] Pager: [phone] 04/19/2013 11:50 AM  I evaluated and examined patient, reviewed records, labs and imaging, and agree with note and plan. Patient likely had a peri-procedural embolism to retinal artery branch. Complete workup to help reduce risk for future embolic events. Needs ophthalmology followup as outpatient.  Suanne Marker, MD 04/19/2013, 10:39  PM Certified in Neurology, Neurophysiology and Neuroimaging [facility] - Stroke Team  Please refer to [email or web address] for on-call Stroke MD

## 2013-04-19 NOTE — Progress Notes (Signed)
We are trying to verify True Diagnosis -- CVA or not. Awaiting formal work from Neurology.  Will need to update Problem list.  Marykay Lex, MD

## 2013-04-19 NOTE — Progress Notes (Signed)
Subjective: Patient has had improvement in his vision since its initial onset but continues to have some central vision abnormalities.    Objective: Current vital signs: BP 151/90  Pulse 79  Temp(Src) 98.1 F (36.7 C) (Oral)  Resp 18  Ht 6\' 1"  (1.854 m)  Wt 129.4 kg (285 lb 4.4 oz)  BMI 37.65 kg/m2  SpO2 98% Vital signs in last 24 hours: Temp:  [98.1 F (36.7 C)-98.7 F (37.1 C)] 98.1 F (36.7 C) (09/16 0926) Pulse Rate:  [62-82] 79 (09/16 1702) Resp:  [16-18] 18 (09/16 1702) BP: (101-151)/(49-93) 151/90 mmHg (09/16 1702) SpO2:  [97 %-98 %] 98 % (09/16 1702) Weight:  [129.4 kg (285 lb 4.4 oz)] 129.4 kg (285 lb 4.4 oz) (09/16 0545)  Intake/Output from previous day: 09/15 0701 - 09/16 0700 In: 2372 [P.O.:360; I.V.:2012] Out: 1700 [Urine:1700] Intake/Output this shift:   Nutritional status: Cardiac  Neurologic Exam: Mental Status:  Alert, oriented, thought content appropriate. Speech fluent without evidence of aphasia. Able to follow 3 step commands without difficulty.  Cranial Nerves:  II: Discs flat bilaterally; Right eye central scotoma, pupils equal, round, reactive to light and accommodation  III,IV, VI: ptosis not present, extra-ocular motions intact bilaterally  V,VII: smile symmetric, facial light touch sensation normal bilaterally  VIII: hearing normal bilaterally  IX,X: gag reflex present  XI: bilateral shoulder shrug  XII: midline tongue extension  Motor:  Right : Upper extremity 5/5          Left: Upper extremity 5/5   Lower extremity 5/5       Lower extremity 5/5  Tone and bulk:normal tone throughout; no atrophy noted  Sensory: Pinprick and light touch intact throughout, bilaterally  Deep Tendon Reflexes: Symmetric throughout  Plantars:  Right: mute      Left: mute  Cerebellar:  normal finger-to-nose and normal heel-to-shin test  Gait: not tested  CV: pulses palpable throughout    Lab Results: Basic Metabolic Panel:  Recent Labs Lab  04/17/13 0925 04/19/13 0900  NA 139 139  K 4.4 3.9  CL 103 102  CO2 25 29  GLUCOSE 118* 110*  BUN 13 11  CREATININE 0.74 0.90  CALCIUM 9.7 9.0    Liver Function Tests: No results found for this basename: AST, ALT, ALKPHOS, BILITOT, PROT, ALBUMIN,  in the last 168 hours No results found for this basename: LIPASE, AMYLASE,  in the last 168 hours No results found for this basename: AMMONIA,  in the last 168 hours  CBC:  Recent Labs Lab 04/17/13 0925 04/18/13 0221 04/19/13 0900  WBC 5.5 6.9 5.9  HGB 15.0 13.7 14.5  HCT 43.3 40.1 41.7  MCV 86.9 88.7 87.6  PLT 232 231 227    Cardiac Enzymes:  Recent Labs Lab 04/17/13 1507 04/17/13 2010 04/18/13 0221  TROPONINI <0.30 <0.30 <0.30    Lipid Panel:  Recent Labs Lab 04/18/13 0221  CHOL 201*  TRIG 585*  HDL 28*  CHOLHDL 7.2  VLDL UNABLE TO CALCULATE IF TRIGLYCERIDE OVER 400 mg/dL  LDLCALC UNABLE TO CALCULATE IF TRIGLYCERIDE OVER 400 mg/dL    CBG: No results found for this basename: GLUCAP,  in the last 168 hours  Microbiology: Results for orders placed during the hospital encounter of 04/17/13  MRSA PCR SCREENING     Status: None   Collection Time    04/17/13  1:30 PM      Result Value Range Status   MRSA by PCR NEGATIVE  NEGATIVE Final   Comment:  The GeneXpert MRSA Assay (FDA     approved for NASAL specimens     only), is one component of a     comprehensive MRSA colonization     surveillance program. It is not     intended to diagnose MRSA     infection nor to guide or     monitor treatment for     MRSA infections.    Coagulation Studies:  Recent Labs  04/18/13 0221  LABPROT 13.4  INR 1.04    Imaging: Ct Head Wo Contrast  04/18/2013   CLINICAL DATA:  Slurred speech.  Code stroke.  EXAM: CT HEAD WITHOUT CONTRAST  TECHNIQUE: Contiguous axial images were obtained from the base of the skull through the vertex without intravenous contrast.  COMPARISON:  Brain MRI 07/01/2010. Head CT  07/01/2010.  FINDINGS: Relatively well-defined area of low attenuation in the left temporal and parietal regions involving predominantly the cortex and subcortical white matter, concerning for subacute ischemia. No evidence of acute intracranial hemorrhage. No definite mass, mass effect, hydrocephalus or other abnormal intra or extra-axial fluid collections. No acute displaced skull fractures. Visualized paranasal sinuses and mastoids are generally well pneumatized, with exception of a small mucosal retention cyst or polyp in the posterior aspect of the left maxillary sinus.  IMPRESSION: 1. Low-attenuation in the cortex and subcortical white matter of the left temporal and parietal regions, concerning for probable subacute ischemia. Correlation with MRI may be warranted. 2. Old lacunar infarction in the inferior aspect of the left putamen. These results were called by telephone at the time of interpretation on 04/18/2013 at 7:44 PMto Dr. Thad Ranger, who verbally acknowledged these results.   Electronically Signed   By: Trudie Reed M.D.   On: 04/18/2013 19:47   Mr Maxine Glenn Head Wo Contrast  04/19/2013   CLINICAL DATA:  Stroke. Cardiac catheterization yesterday. Vision change.  EXAM: MRI HEAD WITHOUT CONTRAST  MRA HEAD WITHOUT CONTRAST  TECHNIQUE: Multiplanar, multiecho pulse sequences of the brain and surrounding structures were obtained without intravenous contrast. Angiographic images of the head were obtained using MRA technique without contrast.  COMPARISON:  CT 04/18/2013. MRI 07/01/2010  FINDINGS: MRI HEAD FINDINGS  Negative for acute infarct. Chronic infarct left parietal lobe as noted on prior studies. Benign basal ganglia cyst on the left also unchanged. Cerebral white matter otherwise normal. Brainstem and cerebellum normal.  Negative for hemorrhage. Negative for mass lesion.  Mild mucosal thickening in the paranasal sinuses.  MRA HEAD FINDINGS  Both vertebral arteries are patent to the basilar. The basilar  is widely patent. PICA and AICA patent bilaterally. Superior cerebellar and posterior cerebral arteries are patent.  Right internal carotid artery is patent with mild atherosclerotic disease in the cavernous segment. Anterior and middle cerebral arteries are widely patent bilaterally.  Left internal carotid artery is widely patent with mild atherosclerotic irregularity in the cavernous segment. Left anterior and middle cerebral arteries are patent bilaterally without stenosis.  Negative for cerebral aneurysm.  IMPRESSION: MRI HEAD IMPRESSION  No acute intracranial abnormality. Chronic infarct left parietal lobe.  MRA HEAD IMPRESSION  No significant intracranial stenosis.   Electronically Signed   By: Marlan Palau M.D.   On: 04/19/2013 16:37   Mr Brain Wo Contrast  04/19/2013   CLINICAL DATA:  Stroke. Cardiac catheterization yesterday. Vision change.  EXAM: MRI HEAD WITHOUT CONTRAST  MRA HEAD WITHOUT CONTRAST  TECHNIQUE: Multiplanar, multiecho pulse sequences of the brain and surrounding structures were obtained without intravenous contrast. Angiographic images of  the head were obtained using MRA technique without contrast.  COMPARISON:  CT 04/18/2013. MRI 07/01/2010  FINDINGS: MRI HEAD FINDINGS  Negative for acute infarct. Chronic infarct left parietal lobe as noted on prior studies. Benign basal ganglia cyst on the left also unchanged. Cerebral white matter otherwise normal. Brainstem and cerebellum normal.  Negative for hemorrhage. Negative for mass lesion.  Mild mucosal thickening in the paranasal sinuses.  MRA HEAD FINDINGS  Both vertebral arteries are patent to the basilar. The basilar is widely patent. PICA and AICA patent bilaterally. Superior cerebellar and posterior cerebral arteries are patent.  Right internal carotid artery is patent with mild atherosclerotic disease in the cavernous segment. Anterior and middle cerebral arteries are widely patent bilaterally.  Left internal carotid artery is widely  patent with mild atherosclerotic irregularity in the cavernous segment. Left anterior and middle cerebral arteries are patent bilaterally without stenosis.  Negative for cerebral aneurysm.  IMPRESSION: MRI HEAD IMPRESSION  No acute intracranial abnormality. Chronic infarct left parietal lobe.  MRA HEAD IMPRESSION  No significant intracranial stenosis.   Electronically Signed   By: Marlan Palau M.D.   On: 04/19/2013 16:37    Medications:  I have reviewed the patient's current medications. Scheduled: . aspirin  81 mg Oral Daily  . fenofibrate  160 mg Oral Daily  . metoprolol succinate  25 mg Oral Daily  . omega-3 acid ethyl esters  1 g Oral Daily    Assessment/Plan: Patient has had no additional neurologic changes.  MRI of the brain was performed today and has been reviewed.  It shows no evidence of an acute infarct.  MRA performed as well and shows no evidence of significant stenosis.  Carotid dopplers show no evidence of hemodynamically significant stenosis as well.  Echocardiogram is no indicated at this time.  Patient previously on an aspirin a day.  Patient likely with retinal artery branch occlusion that is not felt to be spontaneous and therefore should not need to be changed to Plavix.  May remain on an aspirin a day.  Findings discussed with patient and wife.    Recommendations: 1.  Patient to see ophthalmology as an outpatient  Case discussed with Dr. Herbie Baltimore   LOS: 2 days   Thana Farr, MD [facility] [phone] 04/19/2013  7:45 PM

## 2013-04-19 NOTE — Progress Notes (Signed)
VASCULAR LAB PRELIMINARY  PRELIMINARY  PRELIMINARY  PRELIMINARY  Carotid duplex  completed.    Preliminary report:  Bilateral:  1-39% ICA stenosis.  Vertebral artery flow is antegrade.      Dylan [name], RVT 04/19/2013, 4:48 PM

## 2013-04-20 ENCOUNTER — Encounter: Payer: Self-pay | Admitting: Cardiology

## 2013-04-21 ENCOUNTER — Telehealth: Payer: Self-pay | Admitting: Cardiology

## 2013-04-21 ENCOUNTER — Encounter: Payer: Self-pay | Admitting: Cardiology

## 2013-04-21 NOTE — Telephone Encounter (Signed)
This is very good news for him.  The final word I had some neurology was that the only way to definitively diagnose if he truly had a retinal artery occlusion would be to a dilated eye exam.  This has not been done.  He went to go see his wife's ophthalmologist today and there was no evidence of retinal artery occlusion. He had vision changes after heart catheterization and there appears to be within the distal embolization event.  MRI, MRA of the brain did not show any major thrombus or vessel occlusion.  Ophthalmologic exam now did not show any evidence of retinal occlusion.  A suggestion of a micro-rupture of a retinal artery branch artery. I have updated problem list and past medical history to indicate that this was not a retinal artery occlusion.  Unfortunately I am not able to update his discharge summary.  He should be seeing Dr. Royann Shivers back in followup soon.  Hopefully the medical record can be corrected at that time to accurately represent his clinical status.  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  Marykay Lex, MD

## 2013-04-21 NOTE — Telephone Encounter (Signed)
Seen by a retinal specialist who told him he did not have a stroke, but a vessel broke due to drastic change in his blood pressure.  Pt. Wanted Dr. Herbie Baltimore to know.

## 2013-04-21 NOTE — Telephone Encounter (Signed)
Seen by a retinal specialist who said he did not have a stoke.  The doctor told him symptoms probably occurred with a drastic change in his blood pressure during the procedure.  Wants Dr. Herbie Baltimore to be aware.

## 2013-04-21 NOTE — Telephone Encounter (Signed)
Went to a [facility] and the spot in his vision was not classified as a stroke.l

## 2013-04-28 ENCOUNTER — Other Ambulatory Visit (HOSPITAL_COMMUNITY): Payer: Self-pay | Admitting: Physician Assistant

## 2013-04-28 DIAGNOSIS — R011 Cardiac murmur, unspecified: Secondary | ICD-10-CM

## 2013-04-29 ENCOUNTER — Encounter: Payer: Self-pay | Admitting: *Deleted

## 2013-05-02 ENCOUNTER — Ambulatory Visit (HOSPITAL_COMMUNITY)
Admission: RE | Admit: 2013-05-02 | Discharge: 2013-05-02 | Disposition: A | Discharge status: Home / Self Care | Payer: Managed Care, Other (non HMO) | Source: Ambulatory Visit | Attending: Physician Assistant | Admitting: Physician Assistant

## 2013-05-02 DIAGNOSIS — I517 Cardiomegaly: Secondary | ICD-10-CM

## 2013-05-02 DIAGNOSIS — R011 Cardiac murmur, unspecified: Secondary | ICD-10-CM | POA: Insufficient documentation

## 2013-05-02 DIAGNOSIS — I379 Nonrheumatic pulmonary valve disorder, unspecified: Secondary | ICD-10-CM | POA: Insufficient documentation

## 2013-05-02 NOTE — Progress Notes (Signed)
*  PRELIMINARY RESULTS* Echocardiogram 2D Echocardiogram has been performed.  Dylan [name] 05/02/2013, 3:17 PM

## 2013-05-04 ENCOUNTER — Ambulatory Visit: Payer: Self-pay | Admitting: Cardiology

## 2013-10-15 ENCOUNTER — Other Ambulatory Visit (HOSPITAL_COMMUNITY): Payer: Self-pay | Admitting: Cardiology

## 2013-10-17 NOTE — Telephone Encounter (Signed)
Rx was sent to pharmacy electronically. 

## 2013-12-17 ENCOUNTER — Other Ambulatory Visit (HOSPITAL_COMMUNITY): Payer: Self-pay | Admitting: Cardiology

## 2013-12-19 ENCOUNTER — Other Ambulatory Visit: Payer: Self-pay | Admitting: *Deleted

## 2013-12-21 NOTE — Telephone Encounter (Signed)
Rx was sent to pharmacy electronically. 

## 2013-12-21 NOTE — Telephone Encounter (Signed)
Rx was sent to pharmacy electronically. Last OV 07/2012

## 2014-02-19 ENCOUNTER — Other Ambulatory Visit (HOSPITAL_COMMUNITY): Payer: Self-pay | Admitting: Cardiology

## 2014-07-13 ENCOUNTER — Encounter (HOSPITAL_COMMUNITY): Payer: Self-pay | Admitting: Cardiovascular Disease

## 2014-08-04 ENCOUNTER — Inpatient Hospital Stay (HOSPITAL_COMMUNITY)
Admission: EM | Admit: 2014-08-04 | Discharge: 2014-08-16 | DRG: 344 | Disposition: A | Discharge status: Home / Self Care | Payer: BLUE CROSS/BLUE SHIELD | Attending: Internal Medicine | Admitting: Internal Medicine

## 2014-08-04 ENCOUNTER — Emergency Department (HOSPITAL_COMMUNITY): Payer: BLUE CROSS/BLUE SHIELD

## 2014-08-04 ENCOUNTER — Encounter (HOSPITAL_COMMUNITY): Payer: Self-pay | Admitting: *Deleted

## 2014-08-04 DIAGNOSIS — R7309 Other abnormal glucose: Secondary | ICD-10-CM | POA: Diagnosis present

## 2014-08-04 DIAGNOSIS — E860 Dehydration: Secondary | ICD-10-CM | POA: Diagnosis present

## 2014-08-04 DIAGNOSIS — N19 Unspecified kidney failure: Secondary | ICD-10-CM

## 2014-08-04 DIAGNOSIS — I214 Non-ST elevation (NSTEMI) myocardial infarction: Secondary | ICD-10-CM | POA: Diagnosis not present

## 2014-08-04 DIAGNOSIS — K560 Paralytic ileus: Secondary | ICD-10-CM

## 2014-08-04 DIAGNOSIS — K550 Acute vascular disorders of intestine: Secondary | ICD-10-CM | POA: Diagnosis present

## 2014-08-04 DIAGNOSIS — K5669 Other intestinal obstruction: Secondary | ICD-10-CM | POA: Diagnosis not present

## 2014-08-04 DIAGNOSIS — E669 Obesity, unspecified: Secondary | ICD-10-CM | POA: Diagnosis present

## 2014-08-04 DIAGNOSIS — N170 Acute kidney failure with tubular necrosis: Secondary | ICD-10-CM | POA: Diagnosis not present

## 2014-08-04 DIAGNOSIS — T82857A Stenosis of cardiac prosthetic devices, implants and grafts, initial encounter: Secondary | ICD-10-CM | POA: Diagnosis present

## 2014-08-04 DIAGNOSIS — I2510 Atherosclerotic heart disease of native coronary artery without angina pectoris: Secondary | ICD-10-CM | POA: Diagnosis present

## 2014-08-04 DIAGNOSIS — R109 Unspecified abdominal pain: Secondary | ICD-10-CM

## 2014-08-04 DIAGNOSIS — Z7982 Long term (current) use of aspirin: Secondary | ICD-10-CM

## 2014-08-04 DIAGNOSIS — Y832 Surgical operation with anastomosis, bypass or graft as the cause of abnormal reaction of the patient, or of later complication, without mention of misadventure at the time of the procedure: Secondary | ICD-10-CM | POA: Diagnosis present

## 2014-08-04 DIAGNOSIS — Z86718 Personal history of other venous thrombosis and embolism: Secondary | ICD-10-CM

## 2014-08-04 DIAGNOSIS — I25119 Atherosclerotic heart disease of native coronary artery with unspecified angina pectoris: Secondary | ICD-10-CM | POA: Diagnosis present

## 2014-08-04 DIAGNOSIS — Z6839 Body mass index (BMI) 39.0-39.9, adult: Secondary | ICD-10-CM

## 2014-08-04 DIAGNOSIS — I8290 Acute embolism and thrombosis of unspecified vein: Secondary | ICD-10-CM | POA: Diagnosis present

## 2014-08-04 DIAGNOSIS — Z87891 Personal history of nicotine dependence: Secondary | ICD-10-CM

## 2014-08-04 DIAGNOSIS — Z8673 Personal history of transient ischemic attack (TIA), and cerebral infarction without residual deficits: Secondary | ICD-10-CM

## 2014-08-04 DIAGNOSIS — T387X5A Adverse effect of androgens and anabolic congeners, initial encounter: Secondary | ICD-10-CM | POA: Diagnosis present

## 2014-08-04 DIAGNOSIS — I10 Essential (primary) hypertension: Secondary | ICD-10-CM | POA: Diagnosis present

## 2014-08-04 DIAGNOSIS — E871 Hypo-osmolality and hyponatremia: Secondary | ICD-10-CM

## 2014-08-04 DIAGNOSIS — N179 Acute kidney failure, unspecified: Secondary | ICD-10-CM | POA: Diagnosis present

## 2014-08-04 DIAGNOSIS — T82867A Thrombosis of cardiac prosthetic devices, implants and grafts, initial encounter: Secondary | ICD-10-CM | POA: Clinically undetermined

## 2014-08-04 DIAGNOSIS — E8809 Other disorders of plasma-protein metabolism, not elsewhere classified: Secondary | ICD-10-CM | POA: Diagnosis present

## 2014-08-04 DIAGNOSIS — Z9861 Coronary angioplasty status: Secondary | ICD-10-CM

## 2014-08-04 DIAGNOSIS — D6869 Other thrombophilia: Secondary | ICD-10-CM | POA: Diagnosis present

## 2014-08-04 DIAGNOSIS — I252 Old myocardial infarction: Secondary | ICD-10-CM

## 2014-08-04 DIAGNOSIS — F17200 Nicotine dependence, unspecified, uncomplicated: Secondary | ICD-10-CM

## 2014-08-04 DIAGNOSIS — R100 Acute abdomen: Secondary | ICD-10-CM | POA: Diagnosis present

## 2014-08-04 DIAGNOSIS — R0602 Shortness of breath: Secondary | ICD-10-CM

## 2014-08-04 DIAGNOSIS — R0902 Hypoxemia: Secondary | ICD-10-CM

## 2014-08-04 DIAGNOSIS — I81 Portal vein thrombosis: Secondary | ICD-10-CM | POA: Diagnosis not present

## 2014-08-04 DIAGNOSIS — K56609 Unspecified intestinal obstruction, unspecified as to partial versus complete obstruction: Secondary | ICD-10-CM

## 2014-08-04 DIAGNOSIS — R1011 Right upper quadrant pain: Secondary | ICD-10-CM | POA: Diagnosis present

## 2014-08-04 DIAGNOSIS — IMO0002 Reserved for concepts with insufficient information to code with codable children: Secondary | ICD-10-CM

## 2014-08-04 DIAGNOSIS — E780 Pure hypercholesterolemia: Secondary | ICD-10-CM | POA: Diagnosis present

## 2014-08-04 DIAGNOSIS — K55069 Acute infarction of intestine, part and extent unspecified: Secondary | ICD-10-CM | POA: Diagnosis present

## 2014-08-04 HISTORY — DX: Personal history of other venous thrombosis and embolism: Z86.718

## 2014-08-04 LAB — COMPREHENSIVE METABOLIC PANEL
ALBUMIN: 3.6 g/dL (ref 3.5–5.2)
ALT: 39 U/L (ref 0–53)
ANION GAP: 11 (ref 5–15)
AST: 34 U/L (ref 0–37)
Alkaline Phosphatase: 69 U/L (ref 39–117)
BILIRUBIN TOTAL: 0.9 mg/dL (ref 0.3–1.2)
BUN: 45 mg/dL — AB (ref 6–23)
CHLORIDE: 92 meq/L — AB (ref 96–112)
CO2: 28 mmol/L (ref 19–32)
CREATININE: 2.77 mg/dL — AB (ref 0.50–1.35)
Calcium: 9.2 mg/dL (ref 8.4–10.5)
GFR calc Af Amer: 29 mL/min — ABNORMAL LOW (ref >90)
GFR calc non Af Amer: 25 mL/min — ABNORMAL LOW (ref >90)
Glucose, Bld: 136 mg/dL — ABNORMAL HIGH (ref 70–99)
Potassium: 4 mmol/L (ref 3.5–5.1)
Sodium: 131 mmol/L — ABNORMAL LOW (ref 135–145)
TOTAL PROTEIN: 7 g/dL (ref 6.0–8.3)

## 2014-08-04 LAB — LACTIC ACID, PLASMA: Lactic Acid, Venous: 1.9 mmol/L (ref 0.5–2.2)

## 2014-08-04 LAB — CBC WITH DIFFERENTIAL/PLATELET
BASOS PCT: 1 % (ref 0–1)
Basophils Absolute: 0.1 10*3/uL (ref 0.0–0.1)
Eosinophils Absolute: 0.1 10*3/uL (ref 0.0–0.7)
Eosinophils Relative: 0 % (ref 0–5)
HCT: 46.9 % (ref 39.0–52.0)
Hemoglobin: 14.7 g/dL (ref 13.0–17.0)
Lymphocytes Relative: 41 % (ref 12–46)
Lymphs Abs: 4.7 10*3/uL — ABNORMAL HIGH (ref 0.7–4.0)
MCH: 28.8 pg (ref 26.0–34.0)
MCHC: 31.3 g/dL (ref 30.0–36.0)
MCV: 92 fL (ref 78.0–100.0)
Monocytes Absolute: 0.9 10*3/uL (ref 0.1–1.0)
Monocytes Relative: 8 % (ref 3–12)
NEUTROS ABS: 5.8 10*3/uL (ref 1.7–7.7)
NEUTROS PCT: 50 % (ref 43–77)
Platelets: 177 10*3/uL (ref 150–400)
RBC: 5.1 MIL/uL (ref 4.22–5.81)
RDW: 14.6 % (ref 11.5–15.5)
WBC: 11.6 10*3/uL — ABNORMAL HIGH (ref 4.0–10.5)

## 2014-08-04 LAB — LIPASE, BLOOD: LIPASE: 59 U/L (ref 11–59)

## 2014-08-04 LAB — I-STAT TROPONIN, ED: Troponin i, poc: 0.03 ng/mL (ref 0.00–0.08)

## 2014-08-04 MED ORDER — FENTANYL CITRATE 0.05 MG/ML IJ SOLN
100.0000 ug | Freq: Once | INTRAMUSCULAR | Status: AC
  Administered 2014-08-04: 100 ug via INTRAVENOUS
  Filled 2014-08-04: qty 2

## 2014-08-04 MED ORDER — ONDANSETRON HCL 4 MG/2ML IJ SOLN
4.0000 mg | Freq: Once | INTRAMUSCULAR | Status: AC
Start: 2014-08-04 — End: 2014-08-04
  Administered 2014-08-04: 4 mg via INTRAVENOUS
  Filled 2014-08-04: qty 2

## 2014-08-04 MED ORDER — NALOXONE HCL 0.4 MG/ML IJ SOLN: 0.4000 mg | INTRAMUSCULAR | Status: DC | PRN

## 2014-08-04 MED ORDER — METOPROLOL SUCCINATE ER 25 MG PO TB24: 25.0000 mg | ORAL_TABLET | Freq: Every day | ORAL | Status: DC

## 2014-08-04 MED ORDER — DIPHENHYDRAMINE HCL 12.5 MG/5ML PO ELIX
12.5000 mg | ORAL_SOLUTION | Freq: Four times a day (QID) | ORAL | Status: DC | PRN
  Filled 2014-08-04: qty 5

## 2014-08-04 MED ORDER — OMEGA-3-ACID ETHYL ESTERS 1 G PO CAPS
1.0000 g | ORAL_CAPSULE | Freq: Every day | ORAL | Status: DC
  Administered 2014-08-06 – 2014-08-16 (×11): 1 g via ORAL
  Filled 2014-08-04 (×13): qty 1

## 2014-08-04 MED ORDER — SODIUM CHLORIDE 0.9 % IJ SOLN: 9.0000 mL | INTRAMUSCULAR | Status: DC | PRN

## 2014-08-04 MED ORDER — HYDROMORPHONE HCL 2 MG/ML IJ SOLN
2.0000 mg | Freq: Once | INTRAMUSCULAR | Status: AC
  Administered 2014-08-04: 2 mg via INTRAVENOUS

## 2014-08-04 MED ORDER — PROMETHAZINE HCL 25 MG/ML IJ SOLN
25.0000 mg | Freq: Four times a day (QID) | INTRAMUSCULAR | Status: DC | PRN
  Administered 2014-08-04 – 2014-08-05 (×2): 25 mg via INTRAVENOUS
  Filled 2014-08-04 (×2): qty 1

## 2014-08-04 MED ORDER — IOHEXOL 300 MG/ML  SOLN
50.0000 mL | Freq: Once | INTRAMUSCULAR | Status: AC | PRN
  Administered 2014-08-04: 50 mL via ORAL

## 2014-08-04 MED ORDER — HYDROMORPHONE HCL 1 MG/ML IJ SOLN
1.0000 mg | Freq: Once | INTRAMUSCULAR | Status: AC
  Administered 2014-08-04: 1 mg via INTRAVENOUS
  Filled 2014-08-04: qty 1

## 2014-08-04 MED ORDER — HEPARIN SODIUM (PORCINE) 5000 UNIT/ML IJ SOLN
5000.0000 [IU] | Freq: Three times a day (TID) | INTRAMUSCULAR | Status: DC
  Filled 2014-08-04 (×4): qty 1

## 2014-08-04 MED ORDER — ONDANSETRON HCL 4 MG/2ML IJ SOLN
4.0000 mg | Freq: Once | INTRAMUSCULAR | Status: AC
  Administered 2014-08-04: 4 mg via INTRAVENOUS
  Filled 2014-08-04: qty 2

## 2014-08-04 MED ORDER — SODIUM CHLORIDE 0.9 % IJ SOLN
3.0000 mL | Freq: Two times a day (BID) | INTRAMUSCULAR | Status: DC
  Administered 2014-08-05 – 2014-08-09 (×5): 3 mL via INTRAVENOUS

## 2014-08-04 MED ORDER — ALLOPURINOL 300 MG PO TABS
300.0000 mg | ORAL_TABLET | Freq: Every day | ORAL | Status: DC
  Administered 2014-08-05 – 2014-08-16 (×12): 300 mg via ORAL
  Filled 2014-08-04 (×13): qty 1

## 2014-08-04 MED ORDER — NITROGLYCERIN 0.4 MG SL SUBL
0.4000 mg | SUBLINGUAL_TABLET | SUBLINGUAL | Status: DC | PRN
  Administered 2014-08-13: 0.4 mg via SUBLINGUAL

## 2014-08-04 MED ORDER — ONDANSETRON HCL 4 MG/2ML IJ SOLN
4.0000 mg | Freq: Four times a day (QID) | INTRAMUSCULAR | Status: DC | PRN
  Administered 2014-08-04 (×2): 4 mg via INTRAVENOUS
  Filled 2014-08-04 (×2): qty 2

## 2014-08-04 MED ORDER — MORPHINE SULFATE 4 MG/ML IJ SOLN
4.0000 mg | Freq: Once | INTRAMUSCULAR | Status: AC
  Administered 2014-08-04: 4 mg via INTRAVENOUS
  Filled 2014-08-04: qty 1

## 2014-08-04 MED ORDER — ASPIRIN 81 MG PO CHEW
81.0000 mg | CHEWABLE_TABLET | Freq: Every day | ORAL | Status: DC
  Administered 2014-08-05 – 2014-08-09 (×5): 81 mg via ORAL
  Filled 2014-08-04 (×6): qty 1

## 2014-08-04 MED ORDER — HYDROMORPHONE 0.3 MG/ML IV SOLN
INTRAVENOUS | Status: DC
  Administered 2014-08-04 (×2): via INTRAVENOUS
  Administered 2014-08-04: 4.25 mg via INTRAVENOUS
  Administered 2014-08-04: 5.82 mg via INTRAVENOUS
  Administered 2014-08-05: 1.24 mg via INTRAVENOUS
  Administered 2014-08-05: 3 mg via INTRAVENOUS
  Administered 2014-08-05: 11:00:00 via INTRAVENOUS
  Administered 2014-08-05: 2.88 mg via INTRAVENOUS
  Administered 2014-08-05 (×2): 0.9 mg via INTRAVENOUS
  Administered 2014-08-05: 03:00:00 via INTRAVENOUS
  Administered 2014-08-05: 2.4 mg via INTRAVENOUS
  Administered 2014-08-06: 1.2 mg via INTRAVENOUS
  Administered 2014-08-06: 1.5 mg via INTRAVENOUS
  Administered 2014-08-06: 14:00:00 via INTRAVENOUS
  Administered 2014-08-06: 1.2 mg via INTRAVENOUS
  Administered 2014-08-06 (×2): 0.6 mg via INTRAVENOUS
  Administered 2014-08-07: 1.2 mg via INTRAVENOUS
  Administered 2014-08-07: 1.8 mg via INTRAVENOUS
  Administered 2014-08-07: 0.3 mg via INTRAVENOUS
  Administered 2014-08-07: 0.6 mg via INTRAVENOUS
  Administered 2014-08-07: 0.9 mg via INTRAVENOUS
  Administered 2014-08-08: 18:00:00 via INTRAVENOUS
  Administered 2014-08-08: 2.79 mg via INTRAVENOUS
  Administered 2014-08-08: 02:00:00 via INTRAVENOUS
  Administered 2014-08-08: 1.3 mg via INTRAVENOUS
  Administered 2014-08-09: 17:00:00 via INTRAVENOUS
  Administered 2014-08-09: 5.7 mg via INTRAVENOUS
  Administered 2014-08-09: 4.2 mg via INTRAVENOUS
  Administered 2014-08-09: 08:00:00 via INTRAVENOUS
  Administered 2014-08-09: 1.8 mg via INTRAVENOUS
  Administered 2014-08-09: 1.5 mg via INTRAVENOUS
  Administered 2014-08-10: 2.57 mg via INTRAVENOUS
  Administered 2014-08-10 (×2): via INTRAVENOUS
  Administered 2014-08-10: 2.55 mg via INTRAVENOUS
  Administered 2014-08-10: 3.9 mg via INTRAVENOUS
  Administered 2014-08-10: 3.3 mg via INTRAVENOUS
  Administered 2014-08-11: 1.2 mg via INTRAVENOUS
  Administered 2014-08-11: 2.4 mg via INTRAVENOUS
  Administered 2014-08-11: 1.36 mg via INTRAVENOUS
  Administered 2014-08-11: 08:00:00 via INTRAVENOUS
  Administered 2014-08-11: 1.5 mL via INTRAVENOUS
  Administered 2014-08-11: 0 mg via INTRAVENOUS
  Administered 2014-08-11: 1.5 mg via INTRAVENOUS
  Administered 2014-08-12: 0.6 mg via INTRAVENOUS
  Administered 2014-08-12: 0.9 mg via INTRAVENOUS
  Administered 2014-08-12: 0.3 mg via INTRAVENOUS
  Administered 2014-08-12: 0.9 mg via INTRAVENOUS
  Administered 2014-08-12 (×2): 1.8 mg via INTRAVENOUS
  Filled 2014-08-04 (×13): qty 25

## 2014-08-04 MED ORDER — ACETAMINOPHEN 325 MG PO TABS
650.0000 mg | ORAL_TABLET | Freq: Four times a day (QID) | ORAL | Status: DC | PRN
Start: 2014-08-04 — End: 2014-08-16

## 2014-08-04 MED ORDER — ONDANSETRON HCL 4 MG/2ML IJ SOLN
4.0000 mg | Freq: Four times a day (QID) | INTRAMUSCULAR | Status: DC | PRN
  Administered 2014-08-05: 4 mg via INTRAVENOUS
  Filled 2014-08-04: qty 2

## 2014-08-04 MED ORDER — FENOFIBRATE 160 MG PO TABS: 160.0000 mg | ORAL_TABLET | Freq: Every day | ORAL | Status: DC

## 2014-08-04 MED ORDER — ACETAMINOPHEN 650 MG RE SUPP
650.0000 mg | Freq: Four times a day (QID) | RECTAL | Status: DC | PRN
Start: 2014-08-04 — End: 2014-08-16

## 2014-08-04 MED ORDER — SODIUM CHLORIDE 0.9 % IV SOLN
INTRAVENOUS | Status: DC
  Administered 2014-08-04 – 2014-08-12 (×14): via INTRAVENOUS

## 2014-08-04 MED ORDER — METRONIDAZOLE IN NACL 5-0.79 MG/ML-% IV SOLN
500.0000 mg | Freq: Three times a day (TID) | INTRAVENOUS | Status: DC
  Administered 2014-08-04 – 2014-08-11 (×21): 500 mg via INTRAVENOUS
  Filled 2014-08-04 (×26): qty 100

## 2014-08-04 MED ORDER — DIPHENHYDRAMINE HCL 50 MG/ML IJ SOLN: 12.5000 mg | Freq: Four times a day (QID) | INTRAMUSCULAR | Status: DC | PRN

## 2014-08-04 MED ORDER — CIPROFLOXACIN IN D5W 400 MG/200ML IV SOLN
400.0000 mg | Freq: Two times a day (BID) | INTRAVENOUS | Status: DC
  Administered 2014-08-04 – 2014-08-11 (×13): 400 mg via INTRAVENOUS
  Filled 2014-08-04 (×18): qty 200

## 2014-08-04 MED ORDER — SODIUM CHLORIDE 0.9 % IV SOLN
INTRAVENOUS | Status: DC
  Administered 2014-08-04: 09:00:00 via INTRAVENOUS

## 2014-08-04 MED ORDER — HYDROMORPHONE HCL 2 MG/ML IJ SOLN
INTRAMUSCULAR | Status: AC
  Filled 2014-08-04: qty 1

## 2014-08-04 MED ORDER — ONDANSETRON HCL 4 MG PO TABS: 4.0000 mg | ORAL_TABLET | Freq: Four times a day (QID) | ORAL | Status: DC | PRN

## 2014-08-04 MED ORDER — METOPROLOL SUCCINATE ER 50 MG PO TB24
50.0000 mg | ORAL_TABLET | Freq: Every day | ORAL | Status: DC
  Administered 2014-08-05 – 2014-08-16 (×12): 50 mg via ORAL
  Filled 2014-08-04 (×12): qty 1

## 2014-08-04 NOTE — ED Notes (Addendum)
Ed nurse notified and floor charge nurse Hinton Dyer notified of 20 min timer start...klj. [id number] time up.

## 2014-08-04 NOTE — ED Notes (Signed)
Pt transported to xray 

## 2014-08-04 NOTE — H&P (Signed)
[facility] History and Physical  SHAYE LAGACE ZJI:[id number] DOB: [birth year] DOA: 08/04/2014  Referring physician: Dr Venita Sheffield PCP: Celedonio Miyamoto, MD  Chief Complaint:  abdominal pain x 4 days  HPI:  49 y/o obese male with hx of CAD s/p angioplasty in 2013 presented with acute abdominal pain since 4 days. Pain was acute , initially over RLQ sharp and non radiating. No associated fever or chills. Had 4-5 episodes of diarrhea daily. Pain diffuse today. Had 3 episodes of diarrhea since this am and 2-3 episodes of vomiting of clear liquid this morning. No blood in stool. Denies eating outside. Had URI symptoms 5-6 days back and was prescribed a course of prednisone and azithromycin by PCP.  Patient denies headache, dizziness, fever, chills, chest pain, palpitations, SOB,   or urinary symptoms. Has poor appetite. Denies change in weight.  Review of Systems:  Constitutional: diaphoresis, poor appetite. Denies fever, chills,  HEENT: Denies photophobia, eye pain, redness, hearing loss, ear pain, congestion, sore throat, rhinorrhea, sneezing, mouth sores, trouble swallowing, neck pain, neck stiffness and tinnitus.   Respiratory: Denies SOB, DOE, cough, chest tightness,  and wheezing.   Cardiovascular: Denies chest pain, palpitations and leg swelling.  Gastrointestinal:  nausea, vomiting, abdominal pain, diarrhea, denies constipation, blood in stool and abdominal distention.  Genitourinary: Denies dysuria, urgency, frequency, hematuria, flank pain and difficulty urinating.  Endocrine: Denies: hot or cold intolerance, sweats, changes in hair or nails, polyuria, polydipsia. Musculoskeletal: Denies myalgias, back pain, joint swelling, arthralgias and gait problem.  Skin: Denies pallor, rash and wound.  Neurological: Denies dizziness, seizures, syncope, weakness, light-headedness, numbness and headaches.  Hematological: Denies adenopathy. Easy bruising, personal or family bleeding history   Psychiatric/Behavioral: Denies suicidal ideation, mood changes, confusion, nervousness, sleep disturbance and agitation   Past Medical History  Diagnosis Date  . Hypertension   . Coronary artery disease   . S/P angioplasty with stent (angiosculpt) of RCA for "in stent restenosis" 07/06/2012  . Hypercholesteremia   . Anginal pain   . NSTEMI (non-ST elevated myocardial infarction) 07/2010    r/t total occlusion of RCA (3 Taxus Ion DES)  . Stroke 2011    S/P cardiac cath - embolic stroke; denies residual (07/06/2012)  . Migraines     "maybe one/yr" (07/06/2012)  . 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 04/19/2013    No evidence of CVA on MRI/MRA & Dliated Eye Examination. ruptured blood vessel & not occlusion.   Past Surgical History  Procedure Laterality Date  . Septoplasty  2001  . Coronary angioplasty with stent placement  07/2010    inferior wall MI - 3 Taxus Ion DES (3.0x45mm, 3.0x45mm, 3.5x70mm) to prox and mid RCA  . Coronary angioplasty  07/06/2012    in-stent restenotic lesion in RCA 80%   . Cardiac catheterization  04/18/2013  . Left heart catheterization with coronary angiogram N/A 06/11/2012    Procedure: LEFT HEART CATHETERIZATION WITH CORONARY ANGIOGRAM;  Surgeon: [name], MD;  Location: [facility] CATH [facility];  Service: Cardiovascular;  Laterality: N/A;  . Percutaneous coronary stent intervention (pci-s) N/A 07/06/2012    Procedure: PERCUTANEOUS CORONARY STENT INTERVENTION (PCI-S);  Surgeon: [name], MD;  Location: [facility];  Service: Cardiovascular;  Laterality: N/A;  . Left heart catheterization with coronary angiogram N/A 04/18/2013    Procedure: LEFT HEART CATHETERIZATION WITH CORONARY ANGIOGRAM;  Surgeon: [name], MD;  Location: [facility] CATH  LAB;  Service: Cardiovascular;  Laterality: N/A;   Social History:  reports that he quit smoking about 4 years ago. His  smoking use included Cigarettes. He has a 15 pack-year smoking history. He quit smokeless tobacco use about 4 years ago. His smokeless tobacco use included Chew. He reports that he drinks about 16.8 oz of alcohol per week. He reports that he does not use illicit drugs.  Allergies  Allergen Reactions  . Ace Inhibitors Cough  . Lipitor [Atorvastatin] Other (See Comments)    myalgia  . Codeine Itching    Family History  Problem Relation Age of Onset  . Atrial fibrillation Mother   . Mitral valve prolapse Mother   . Coronary artery disease Father     CABG, aortic aneursym,    Prior to Admission medications   Medication Sig Start Date End Date Taking? Authorizing Provider  allopurinol (ZYLOPRIM) 300 MG tablet Take 1 tablet by mouth daily. 07/03/14  Yes Historical Provider, MD  aspirin 81 MG chewable tablet Chew 1 tablet (81 mg total) by mouth daily. 07/07/12  Yes Luke K Kilroy, PA-C  azithromycin (ZITHROMAX) 500 MG tablet See admin instructions. 3 tabs on day 1, 2 tabs daily on days 2 and 3 07/25/14  Yes Historical Provider, MD  cefdinir (OMNICEF) 300 MG capsule Take 1 capsule by mouth 2 (two) times daily. 07/29/14  Yes Historical Provider, MD  furosemide (LASIX) 20 MG tablet Take 20 mg by mouth daily.   Yes Historical Provider, MD  Liraglutide -Weight Management (SAXENDA) 18 MG/3ML SOPN Inject 3 mLs into the skin daily.   Yes Historical Provider, MD  metoprolol succinate (TOPROL-XL) 50 MG 24 hr tablet Take 50 mg by mouth daily. Take with or immediately following a meal.   Yes Historical Provider, MD  olmesartan-hydrochlorothiazide (BENICAR HCT) 40-25 MG per tablet Take 1 tablet by mouth daily.   Yes Historical Provider, MD  omega-3 acid ethyl esters (LOVAZA) 1 G capsule Take 1 capsule (1 g total) by mouth daily. <please make appointment>   Yes Brittainy M Simmons, PA-C  predniSONE (STERAPRED UNI-PAK) 5 MG TABS tablet See admin instructions. 6 tabs on the first day, decreasing by 1 tab daily  until gone 07/29/14  Yes Historical Provider, MD  testosterone cypionate (DEPOTESTOTERONE CYPIONATE) 200 MG/ML injection Inject 1 mL into the muscle once a week. Wednesday 06/07/14  Yes Historical Provider, MD  acetaminophen (TYLENOL) 325 MG tablet Take 2 tablets (650 mg total) by mouth every 4 (four) hours as needed. 04/19/13   Erlene Quan, PA-C  fenofibrate 160 MG tablet Take 1 tablet (160 mg total) by mouth daily. Patient not taking: Reported on 08/04/2014 04/18/13   Brittainy M Rosita Fire, PA-C  metoprolol succinate (TOPROL-XL) 25 MG 24 hr tablet Take 1 tablet (25 mg total) by mouth daily. <please make appointment> Patient not taking: Reported on 08/04/2014    Erlene Quan, PA-C  nitroGLYCERIN (NITROSTAT) 0.4 MG SL tablet Place 1 tablet (0.4 mg total) under the tongue every 5 (five) minutes x 3 doses as needed for chest pain. 07/07/12   Erlene Quan, PA-C     Physical Exam:  Filed Vitals:   08/04/14 0334 08/04/14 0746  BP: 92/61 127/71  Pulse: 120 108  Temp:  98.6 F (37 C)  TempSrc:  Oral  Resp: 20 22  SpO2: 94% 93%    Constitutional: Vital signs reviewed. Middle aged morbidly obese male in distress with pain, sweaty HEENT: no pallor, no icterus, dry oral mucosa, no  cervical lymphadenopathy Cardiovascular:  S1 &S2 tachycardic, no MRG Chest: CTAB, no wheezes, rales, or rhonchi Abdominal: Soft. Mild distention, diffusely tender more over RLQ, BS sluggish,  No guarding or rigidity noted GU: no CVA tenderness Ext: warm, trace  edema Neurological: A&O x3, non focal  Labs on Admission:  Basic Metabolic Panel:  Recent Labs Lab 08/04/14 0412  NA 131*  K 4.0  CL 92*  CO2 28  GLUCOSE 136*  BUN 45*  CREATININE 2.77*  CALCIUM 9.2   Liver Function Tests:  Recent Labs Lab 08/04/14 0412  AST 34  ALT 39  ALKPHOS 69  BILITOT 0.9  PROT 7.0  ALBUMIN 3.6    Recent Labs Lab 08/04/14 0412  LIPASE 59   No results for input(s): AMMONIA in the last 168 hours. CBC:  Recent  Labs Lab 08/04/14 0412  WBC 11.6*  NEUTROABS 5.8  HGB 14.7  HCT 46.9  MCV 92.0  PLT 177   Cardiac Enzymes: No results for input(s): CKTOTAL, CKMB, CKMBINDEX, TROPONINI in the last 168 hours. BNP: Invalid input(s): POCBNP CBG: No results for input(s): GLUCAP in the last 168 hours.  Radiological Exams on Admission: Ct Abdomen Pelvis Wo Contrast  08/04/2014   CLINICAL DATA:  Generalized abdomen pain, cramps, nausea, vomiting.  EXAM: CT ABDOMEN AND PELVIS WITHOUT CONTRAST  TECHNIQUE: Multidetector CT imaging of the abdomen and pelvis was performed following the standard protocol without IV contrast. There is oral contrast in the stomach only.  COMPARISON:  July 06, 2002  FINDINGS: The liver, spleen, pancreas, gallbladder, adrenal glands and right kidney are normal. There is no hydronephrosis bilaterally. There is a 2.3 x 3 cm slight low-density lesion in the posterior midpole left kidney with minimal rim calcification ; on the previous CT, a simple cysts was present in the same location. There is atherosclerosis of the abdominal aorta without aneurysmal dilatation. There is no abdominal lymphadenopathy.  There are several loops of abnormal enlarged thick walled small bowel loops with surrounding inflammation and fluid in the right lower quadrant. There is no free air. The appendix is not definitely seen. The colon is normal.  Fluid-filled bladder is normal. Small amount of free fluid is identified in the pelvis. The lung bases are clear. Degenerative joint changes of the spine are noted.  IMPRESSION: Several loops of abnormal enlarged thick walled small bowel loops with surrounding inflammation and fluid in the right lower quadrant. The findings are nonspecific but can be due to infectious or inflammatory etiology.   Electronically Signed   By: Abelardo Diesel M.D.   On: 08/04/2014 07:37   Dg Abd Acute W/chest  08/04/2014   CLINICAL DATA:  Abdominal pain  EXAM: ACUTE ABDOMEN SERIES (ABDOMEN 2 VIEW &  CHEST 1 VIEW)  COMPARISON:  04/17/2013 chest x-ray  FINDINGS: The abdomen is essentially gasless, which limits the utility of radiography. Few bubbles noted in the right lower quadrant. There is no concerning intra-abdominal mass effect or calcification. No pneumoperitoneum. Previous lower abdominal wall mesh repair. Normal heart size and mediastinal contours. No acute infiltrate or edema. No effusion or pneumothorax. No acute osseous findings.  IMPRESSION: 1. There is no definitive obstruction, but this technique is limited by relatively gasless abdomen. Fluid dilated loops of bowel would be missed on this study. 2. Negative chest.   Electronically Signed   By: Jorje Guild M.D.   On: 08/04/2014 06:29    EKG: Sinus tachycardia at 113, no ST-T changes  Assessment/Plan  Principal Problem:  Acute abdomen -differential includes acute appendicitis, inflammatory vs infectious colitis . Acute ischemic colitis is another concern.  -surgery consulted by ED physician. Depending upon their evaluation , may need to consult GI as well. -Check lactate and stool for cdiff. -Empiric cipro and flagyl -serial abd exam. -Pain severe and persistent not controlled with several rounds of IV dilaudid and fentanyl ( received dilaudid 1 mg x2, fentanyl 100 mg x2 and 4 mg IV morphine already).  will place on full dose dilaudid PCA.  Active Problems:   Acute kidney injury Likely prerenal from diarrhea, nausea and vomiting. Also on lasix, HCTZ and ARB which will be held. Reports taking 2 motrin yesterday. Place on IVNS and monitor    CAD, RCA X 3 ION DES 07/01/2010, ISR -06/23/12  , hx of NSTEMI in 2011 Seen by Dr Sallyanne Kuster in past. Has not seen him in almost a year. contineu ASA and BB. Hold other meds     H/O: CVA (cerebrovascular accident), post cardiac cath, minimal speech residual 11/11 post PCI    Hyponatremia Monitor with fluids         Diet: NPO  DVT prophylaxis: sq heparin   Code Status:  full code Family Communication: discussed with pt and his wife at bedside Disposition Plan: admit to [place], [place] [facility] Pager [phone]  Total time spent on admission :70 minutes  If 7PM-7AM, please contact night-coverage [email or web address] Password [facility] 08/04/2014, 8:37 AM

## 2014-08-04 NOTE — ED Notes (Signed)
Morphine was given at 445am, the computer in the room wasn't working

## 2014-08-04 NOTE — ED Provider Notes (Addendum)
CSN: [id number]     Arrival date & time 08/04/14  0327 History   First MD Initiated Contact with Patient 08/04/14 [phone]     Chief Complaint  Patient presents with  . Abdominal Pain     (Consider location/radiation/quality/duration/timing/severity/associated sxs/prior Treatment) Patient is a 49 y.o. male presenting with abdominal pain. The history is provided by the patient.  Abdominal Pain Associated symptoms: diarrhea, nausea and vomiting   Associated symptoms: no chest pain, no dysuria, no fever, no hematuria and no shortness of breath    patient with complaint of right upper quadrant abdominal pain for 4 days. Been getting worse of late. Associated with nausea and vomiting 1 today. Preceded the by diarrhea. Patient never had pain like this before. Pain is 10 out of 10 waxes and wanes. Never goes away. Does not radiate to back flank or down into the testicle area. No blood in the vomit no blood in the diarrhea.  Past Medical History  Diagnosis Date  . Hypertension   . Coronary artery disease   . S/P angioplasty with stent (angiosculpt) of RCA for "in stent restenosis" 07/06/2012  . Hypercholesteremia   . Anginal pain   . NSTEMI (non-ST elevated myocardial infarction) 07/2010    r/t total occlusion of RCA (3 Taxus Ion DES)  . Stroke 2011    S/P cardiac cath - embolic stroke; denies residual (07/06/2012)  . Migraines     "maybe one/yr" (07/06/2012)  . 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 04/19/2013    No evidence of CVA on MRI/MRA & Dliated Eye Examination. ruptured blood vessel & not occlusion.   Past Surgical History  Procedure Laterality Date  . Septoplasty  2001  . Coronary angioplasty with stent placement  07/2010    inferior wall MI - 3 Taxus Ion DES (3.0x3mm, 3.0x14mm, 3.5x12mm) to prox and mid RCA  . Coronary angioplasty  07/06/2012    in-stent restenotic lesion in RCA 80%    . Cardiac catheterization  04/18/2013  . Left heart catheterization with coronary angiogram N/A 06/11/2012    Procedure: LEFT HEART CATHETERIZATION WITH CORONARY ANGIOGRAM;  Surgeon: [name], MD;  Location: [facility]ATH [facility];  Service: Cardiovascular;  Laterality: N/A;  . Percutaneous coronary stent intervention (pci-s) N/A 07/06/2012    Procedure: PERCUTANEOUS CORONARY STENT INTERVENTION (PCI-S);  Surgeon: [name], MD;  Location: [facility] CATH LAB;  Service: Cardiovascular;  Laterality: N/A;  . Left heart catheterization with coronary angiogram N/A 04/18/2013    Procedure: LEFT HEART CATHETERIZATION WITH CORONARY ANGIOGRAM;  Surgeon: [name], MD;  Location: [facility] CATH LAB;  Service: Cardiovascular;  Laterality: N/A;   Family History  Problem Relation Age of Onset  . Atrial fibrillation Mother   . Mitral valve prolapse Mother   . Coronary artery disease Father     CABG, aortic aneursym,   History  Substance Use Topics  . Smoking status: Former Smoker -- 1.00 packs/day for 15 years    Types: Cigarettes    Quit date: 06/30/2010  . Smokeless tobacco: Former Systems developer    Types: [place] date: 06/30/2010  . Alcohol Use: 16.8 oz/week    28 Shots of liquor per week     Comment: 04/18/2013 "2 drinks/ day w/maybe 2 shots in each drink"    Review of Systems  Constitutional: Negative for fever.  HENT: Negative for congestion.   Eyes: Negative  for visual disturbance.  Respiratory: Negative for shortness of breath.   Cardiovascular: Negative for chest pain.  Gastrointestinal: Positive for nausea, vomiting, abdominal pain and diarrhea.  Genitourinary: Negative for dysuria and hematuria.  Musculoskeletal: Negative for back pain.  Skin: Negative for rash.  Neurological: Negative for headaches.  Hematological: Does not bruise/bleed easily.  Psychiatric/Behavioral: Negative for confusion.      Allergies  Ace inhibitors; Lipitor; and Codeine  Home Medications   Prior to Admission  medications   Medication Sig Start Date End Date Taking? Authorizing Provider  allopurinol (ZYLOPRIM) 300 MG tablet Take 1 tablet by mouth daily. 07/03/14  Yes Historical Provider, MD  aspirin 81 MG chewable tablet Chew 1 tablet (81 mg total) by mouth daily. 07/07/12  Yes Luke K Kilroy, PA-C  azithromycin (ZITHROMAX) 500 MG tablet See admin instructions. 3 tabs on day 1, 2 tabs daily on days 2 and 3 07/25/14  Yes Historical Provider, MD  cefdinir (OMNICEF) 300 MG capsule Take 1 capsule by mouth 2 (two) times daily. 07/29/14  Yes Historical Provider, MD  furosemide (LASIX) 20 MG tablet Take 20 mg by mouth daily.   Yes Historical Provider, MD  Liraglutide -Weight Management (SAXENDA) 18 MG/3ML SOPN Inject 3 mLs into the skin daily.   Yes Historical Provider, MD  metoprolol succinate (TOPROL-XL) 50 MG 24 hr tablet Take 50 mg by mouth daily. Take with or immediately following a meal.   Yes Historical Provider, MD  olmesartan-hydrochlorothiazide (BENICAR HCT) 40-25 MG per tablet Take 1 tablet by mouth daily.   Yes Historical Provider, MD  omega-3 acid ethyl esters (LOVAZA) 1 G capsule Take 1 capsule (1 g total) by mouth daily. <please make appointment>   Yes Brittainy M Simmons, PA-C  predniSONE (STERAPRED UNI-PAK) 5 MG TABS tablet See admin instructions. 6 tabs on the first day, decreasing by 1 tab daily until gone 07/29/14  Yes Historical Provider, MD  testosterone cypionate (DEPOTESTOTERONE CYPIONATE) 200 MG/ML injection Inject 1 mL into the muscle once a week. Wednesday 06/07/14  Yes Historical Provider, MD  acetaminophen (TYLENOL) 325 MG tablet Take 2 tablets (650 mg total) by mouth every 4 (four) hours as needed. 04/19/13   Erlene Quan, PA-C  fenofibrate 160 MG tablet Take 1 tablet (160 mg total) by mouth daily. Patient not taking: Reported on 08/04/2014 04/18/13   Brittainy M Rosita Fire, PA-C  metoprolol succinate (TOPROL-XL) 25 MG 24 hr tablet Take 1 tablet (25 mg total) by mouth daily. <please make  appointment> Patient not taking: Reported on 08/04/2014    Erlene Quan, PA-C  nitroGLYCERIN (NITROSTAT) 0.4 MG SL tablet Place 1 tablet (0.4 mg total) under the tongue every 5 (five) minutes x 3 doses as needed for chest pain. 07/07/12   Luke K Kilroy, PA-C   BP 127/71 mmHg  Pulse 108  Temp(Src) 98.6 F (37 C) (Oral)  Resp 22  SpO2 93% Physical Exam  Constitutional: He is oriented to person, place, and time. He appears well-developed and well-nourished. He appears distressed.  HENT:  Head: Normocephalic and atraumatic.  Eyes: Conjunctivae and EOM are normal. Pupils are equal, round, and reactive to light.  Neck: Normal range of motion.  Cardiovascular: Normal rate and normal heart sounds.   No murmur heard. Pulmonary/Chest: Effort normal and breath sounds normal. No respiratory distress.  Abdominal: Soft. Bowel sounds are normal. There is tenderness. There is no guarding.  Tender to palpation right upper quadrant.  Musculoskeletal: Normal range of motion.  Neurological: He is alert  and oriented to person, place, and time.  Skin: Skin is warm. No rash noted.  Nursing note and vitals reviewed.   ED Course  Procedures (including critical care time) Labs Review Labs Reviewed  CBC WITH DIFFERENTIAL - Abnormal; Notable for the following:    WBC 11.6 (*)    Lymphs Abs 4.7 (*)    All other components within normal limits  COMPREHENSIVE METABOLIC PANEL - Abnormal; Notable for the following:    Sodium 131 (*)    Chloride 92 (*)    Glucose, Bld 136 (*)    BUN 45 (*)    Creatinine, Ser 2.77 (*)    GFR calc non Af Amer 25 (*)    GFR calc Af Amer 29 (*)    All other components within normal limits  LIPASE, BLOOD  I-STAT TROPOININ, ED   Results for orders placed or performed during the hospital encounter of 08/04/14  CBC with Differential  Result Value Ref Range   WBC 11.6 (H) 4.0 - 10.5 K/uL   RBC 5.10 4.22 - 5.81 MIL/uL   Hemoglobin 14.7 13.0 - 17.0 g/dL   HCT 46.9 39.0 - 52.0  %   MCV 92.0 78.0 - 100.0 fL   MCH 28.8 26.0 - 34.0 pg   MCHC 31.3 30.0 - 36.0 g/dL   RDW 14.6 11.5 - 15.5 %   Platelets 177 150 - 400 K/uL   Neutrophils Relative % 50 43 - 77 %   Neutro Abs 5.8 1.7 - 7.7 K/uL   Lymphocytes Relative 41 12 - 46 %   Lymphs Abs 4.7 (H) 0.7 - 4.0 K/uL   Monocytes Relative 8 3 - 12 %   Monocytes Absolute 0.9 0.1 - 1.0 K/uL   Eosinophils Relative 0 0 - 5 %   Eosinophils Absolute 0.1 0.0 - 0.7 K/uL   Basophils Relative 1 0 - 1 %   Basophils Absolute 0.1 0.0 - 0.1 K/uL  Comprehensive metabolic panel  Result Value Ref Range   Sodium 131 (L) 135 - 145 mmol/L   Potassium 4.0 3.5 - 5.1 mmol/L   Chloride 92 (L) 96 - 112 mEq/L   CO2 28 19 - 32 mmol/L   Glucose, Bld 136 (H) 70 - 99 mg/dL   BUN 45 (H) 6 - 23 mg/dL   Creatinine, Ser 2.77 (H) 0.50 - 1.35 mg/dL   Calcium 9.2 8.4 - 10.5 mg/dL   Total Protein 7.0 6.0 - 8.3 g/dL   Albumin 3.6 3.5 - 5.2 g/dL   AST 34 0 - 37 U/L   ALT 39 0 - 53 U/L   Alkaline Phosphatase 69 39 - 117 U/L   Total Bilirubin 0.9 0.3 - 1.2 mg/dL   GFR calc non Af Amer 25 (L) >90 mL/min   GFR calc Af Amer 29 (L) >90 mL/min   Anion gap 11 5 - 15  Lipase, blood  Result Value Ref Range   Lipase 59 11 - 59 U/L  I-stat troponin, ED (only if pt is 50 y.o. or older & pain is above umbilicus) - do not order at [facility]  Result Value Ref Range   Troponin i, poc 0.03 0.00 - 0.08 ng/mL   Comment 3             Imaging Review Ct Abdomen Pelvis Wo Contrast  08/04/2014   CLINICAL DATA:  Generalized abdomen pain, cramps, nausea, vomiting.  EXAM: CT ABDOMEN AND PELVIS WITHOUT CONTRAST  TECHNIQUE: Multidetector CT imaging of the abdomen and  pelvis was performed following the standard protocol without IV contrast. There is oral contrast in the stomach only.  COMPARISON:  July 06, 2002  FINDINGS: The liver, spleen, pancreas, gallbladder, adrenal glands and right kidney are normal. There is no hydronephrosis bilaterally. There is a 2.3 x 3 cm slight  low-density lesion in the posterior midpole left kidney with minimal rim calcification ; on the previous CT, a simple cysts was present in the same location. There is atherosclerosis of the abdominal aorta without aneurysmal dilatation. There is no abdominal lymphadenopathy.  There are several loops of abnormal enlarged thick walled small bowel loops with surrounding inflammation and fluid in the right lower quadrant. There is no free air. The appendix is not definitely seen. The colon is normal.  Fluid-filled bladder is normal. Small amount of free fluid is identified in the pelvis. The lung bases are clear. Degenerative joint changes of the spine are noted.  IMPRESSION: Several loops of abnormal enlarged thick walled small bowel loops with surrounding inflammation and fluid in the right lower quadrant. The findings are nonspecific but can be due to infectious or inflammatory etiology.   Electronically Signed   By: Abelardo Diesel M.D.   On: 08/04/2014 07:37   Dg Abd Acute W/chest  08/04/2014   CLINICAL DATA:  Abdominal pain  EXAM: ACUTE ABDOMEN SERIES (ABDOMEN 2 VIEW & CHEST 1 VIEW)  COMPARISON:  04/17/2013 chest x-ray  FINDINGS: The abdomen is essentially gasless, which limits the utility of radiography. Few bubbles noted in the right lower quadrant. There is no concerning intra-abdominal mass effect or calcification. No pneumoperitoneum. Previous lower abdominal wall mesh repair. Normal heart size and mediastinal contours. No acute infiltrate or edema. No effusion or pneumothorax. No acute osseous findings.  IMPRESSION: 1. There is no definitive obstruction, but this technique is limited by relatively gasless abdomen. Fluid dilated loops of bowel would be missed on this study. 2. Negative chest.   Electronically Signed   By: Jorje Guild M.D.   On: 08/04/2014 06:29     EKG Interpretation   Date/Time:  Friday August 04 2014 03:43:43 EST Ventricular Rate:  113 PR Interval:  130 QRS Duration: 79 QT  Interval:  289 QTC Calculation: 396 R Axis:   54 Text Interpretation:  Sinus tachycardia Low voltage, precordial leads  Baseline wander in lead(s) I II III aVR aVF V5 V6 Confirmed by Ronae Noell   MD, Rechel Delosreyes ([id number]) on 08/04/2014 6:35:05 AM      MDM   Final diagnoses:  Abdominal pain    Patient with four-day history of abdominal pain predominantly right-sided mostly right upper quadrant. Progressively getting worse. Pain is very sharp in nature and wax and wane. Plain films of the abdomen without evidence of free air. CT scan pending. Labs show leukocytosis which is mild and significant increase in BUN and creatinine consistent with renal failure. No hyperkalemia. Clinically observing the patient pain is been difficult to control and has been pacing around the room. This may be consistent with a kidney stone that's obstructing. CT scan will help sort this out. Clinically though pain seemed all to be in right upper quadrant she has no flank pain no back pain.   Fredia Sorrow, MD 08/04/14 0732   CT scan shows inflammatory changes thickened loops of small bowel in the right lower quadrant. Appendix not well visualized. Due to the location will consult general surgery for them to review the CT scan. Patient with significant renal insufficiency. Some of this could be  prerenal. Patient will require medical admission for this.  Fredia Sorrow, MD 08/04/14 [phone]

## 2014-08-04 NOTE — ED Notes (Signed)
Medication given and not scanned, computer is not working

## 2014-08-04 NOTE — ED Notes (Signed)
Pt states that he has been having diffuse abd pain x 4 days; pt states that it has been getting progressively worse; pt states that it is sharp pains and gets worse with movement or pressing on it; pt c/o N/V x 1 prior to arrival

## 2014-08-04 NOTE — ED Notes (Signed)
Computer is broken in the room, medications couldn't be scanned

## 2014-08-04 NOTE — Consult Note (Signed)
[facility]  Reason for Consult: abdominal pain  Referring Physician:  Dr. Clementeen Graham, [facility]   Dylan [name] is an 49 y.o. male.  HPI:  Patient is a 49 yo WM with 4 day history of abdominal pain, right mid abdomen, no radiation.  Denies fever or chills.  Diarrheal stools for four days.  No recent travel, no unusual diet.  Was recently treated for URI with Z-pak and prednisone.  Presented to ER with abdominal pain.  CT scan demonstrates multiple loops small bowel with wall thickening and edema consistent with enteritis.  No sign of appendicitis.  No perforation or obstruction.  Patient has hx of CAD/MI with multiple stent placement.  No prior abdominal surgery.  Has not had colonoscopy.  Past Medical History  Diagnosis Date  . Hypertension   . Coronary artery disease   . S/P angioplasty with stent (angiosculpt) of RCA for "in stent restenosis" 07/06/2012  . Hypercholesteremia   . Anginal pain   . NSTEMI (non-ST elevated myocardial infarction) 07/2010    r/t total occlusion of RCA (3 Taxus Ion DES)  . Stroke 2011    S/P cardiac cath - embolic stroke; denies residual (07/06/2012)  . Migraines     "maybe one/yr" (07/06/2012)  . 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 04/19/2013    No evidence of CVA on MRI/MRA & Dliated Eye Examination. ruptured blood vessel & not occlusion.    Past Surgical History  Procedure Laterality Date  . Septoplasty  2001  . Coronary angioplasty with stent placement  07/2010    inferior wall MI - 3 Taxus Ion DES (3.0x28m, 3.0x281m 3.5x12100mto prox and mid RCA  . Coronary angioplasty  07/06/2012    in-stent restenotic lesion in RCA 80%   . Cardiac catheterization  04/18/2013  . Left heart catheterization with coronary angiogram N/A 06/11/2012    Procedure: LEFT HEART CATHETERIZATION WITH CORONARY ANGIOGRAM;   Surgeon: MihSanda KleinD;  [facility];  Service: Cardiovascular;  Laterality: N/A;  . Percutaneous coronary stent intervention (pci-s) N/A 07/06/2012    Procedure: PERCUTANEOUS CORONARY STENT INTERVENTION (PCI-S);  Surgeon: JonLorretta HarpD;  [facility];  Service: Cardiovascular;  Laterality: N/A;  . Left heart catheterization with coronary angiogram N/A 04/18/2013    Procedure: LEFT HEART CATHETERIZATION WITH CORONARY ANGIOGRAM;  Surgeon: ThoTroy SineD;  [facility];  Service: Cardiovascular;  Laterality: N/A;    Family History  Problem Relation Age of Onset  . Atrial fibrillation Mother   . Mitral valve prolapse Mother   . Coronary artery disease Father     CABG, aortic aneursym,    Social History:  reports that he quit smoking about 4 years ago. His smoking use included Cigarettes. He has a 15 pack-year smoking history. He quit smokeless tobacco use about 4 years ago. His smokeless tobacco use included Chew. He reports that he drinks about 16.8 oz of alcohol per week. He reports that he does not use illicit drugs.  Allergies:  Allergies  Allergen Reactions  . Ace Inhibitors Cough  . Lipitor [Atorvastatin] Other (See Comments)    myalgia  . Codeine Itching    Medications: I have reviewed the patient's current medications.  Results for orders placed or performed during the hospital encounter of 08/04/14 (from the past 48 hour(s))  CBC with Differential  Status: Abnormal   Collection Time: 08/04/14  4:12 AM  Result Value Ref Range   WBC 11.6 (H) 4.0 - 10.5 K/uL   RBC 5.10 4.22 - 5.81 MIL/uL   Hemoglobin 14.7 13.0 - 17.0 g/dL   HCT 46.9 39.0 - 52.0 %   MCV 92.0 78.0 - 100.0 fL   MCH 28.8 26.0 - 34.0 pg   MCHC 31.3 30.0 - 36.0 g/dL   RDW 14.6 11.5 - 15.5 %   Platelets 177 150 - 400 K/uL   Neutrophils Relative % 50 43 - 77 %   Neutro Abs 5.8 1.7 - 7.7 K/uL   Lymphocytes Relative 41 12 - 46 %   Lymphs Abs 4.7 (H) 0.7 - 4.0 K/uL    Monocytes Relative 8 3 - 12 %   Monocytes Absolute 0.9 0.1 - 1.0 K/uL   Eosinophils Relative 0 0 - 5 %   Eosinophils Absolute 0.1 0.0 - 0.7 K/uL   Basophils Relative 1 0 - 1 %   Basophils Absolute 0.1 0.0 - 0.1 K/uL  Comprehensive metabolic panel     Status: Abnormal   Collection Time: 08/04/14  4:12 AM  Result Value Ref Range   Sodium 131 (L) 135 - 145 mmol/L    Comment: Please note change in reference range.   Potassium 4.0 3.5 - 5.1 mmol/L    Comment: Please note change in reference range.   Chloride 92 (L) 96 - 112 mEq/L   CO2 28 19 - 32 mmol/L   Glucose, Bld 136 (H) 70 - 99 mg/dL   BUN 45 (H) 6 - 23 mg/dL   Creatinine, Ser 2.77 (H) 0.50 - 1.35 mg/dL   Calcium 9.2 8.4 - 10.5 mg/dL   Total Protein 7.0 6.0 - 8.3 g/dL   Albumin 3.6 3.5 - 5.2 g/dL   AST 34 0 - 37 U/L   ALT 39 0 - 53 U/L   Alkaline Phosphatase 69 39 - 117 U/L   Total Bilirubin 0.9 0.3 - 1.2 mg/dL   GFR calc non Af Amer 25 (L) >90 mL/min   GFR calc Af Amer 29 (L) >90 mL/min    Comment: (NOTE) The eGFR has been calculated using the CKD EPI equation. This calculation has not been validated in all clinical situations. eGFR's persistently <90 mL/min signify possible Chronic Kidney Disease.    Anion gap 11 5 - 15  Lipase, blood     Status: None   Collection Time: 08/04/14  4:12 AM  Result Value Ref Range   Lipase 59 11 - 59 U/L  I-stat troponin, ED (only if pt is 50 y.o. or older & pain is above umbilicus) - do not order at [facility]     Status: None   Collection Time: 08/04/14  4:21 AM  Result Value Ref Range   Troponin i, poc 0.03 0.00 - 0.08 ng/mL   Comment 3            Comment: Due to the release kinetics of cTnI, a negative result within the first hours of the onset of symptoms does not rule out myocardial infarction with certainty. If myocardial infarction is still suspected, repeat the test at appropriate intervals.   Lactic acid, plasma     Status: None   Collection Time: 08/04/14  8:17 AM  Result Value  Ref Range   Lactic Acid, Venous 1.9 0.5 - 2.2 mmol/L    Ct Abdomen Pelvis Wo Contrast  08/04/2014   CLINICAL DATA:  Generalized abdomen pain,  cramps, nausea, vomiting.  EXAM: CT ABDOMEN AND PELVIS WITHOUT CONTRAST  TECHNIQUE: Multidetector CT imaging of the abdomen and pelvis was performed following the standard protocol without IV contrast. There is oral contrast in the stomach only.  COMPARISON:  July 06, 2002  FINDINGS: The liver, spleen, pancreas, gallbladder, adrenal glands and right kidney are normal. There is no hydronephrosis bilaterally. There is a 2.3 x 3 cm slight low-density lesion in the posterior midpole left kidney with minimal rim calcification ; on the previous CT, a simple cysts was present in the same location. There is atherosclerosis of the abdominal aorta without aneurysmal dilatation. There is no abdominal lymphadenopathy.  There are several loops of abnormal enlarged thick walled small bowel loops with surrounding inflammation and fluid in the right lower quadrant. There is no free air. The appendix is not definitely seen. The colon is normal.  Fluid-filled bladder is normal. Small amount of free fluid is identified in the pelvis. The lung bases are clear. Degenerative joint changes of the spine are noted.  IMPRESSION: Several loops of abnormal enlarged thick walled small bowel loops with surrounding inflammation and fluid in the right lower quadrant. The findings are nonspecific but can be due to infectious or inflammatory etiology.   Electronically Signed   By: Abelardo Diesel M.D.   On: 08/04/2014 07:37   Dg Abd Acute W/chest  08/04/2014   CLINICAL DATA:  Abdominal pain  EXAM: ACUTE ABDOMEN SERIES (ABDOMEN 2 VIEW & CHEST 1 VIEW)  COMPARISON:  04/17/2013 chest x-ray  FINDINGS: The abdomen is essentially gasless, which limits the utility of radiography. Few bubbles noted in the right lower quadrant. There is no concerning intra-abdominal mass effect or calcification. No  pneumoperitoneum. Previous lower abdominal wall mesh repair. Normal heart size and mediastinal contours. No acute infiltrate or edema. No effusion or pneumothorax. No acute osseous findings.  IMPRESSION: 1. There is no definitive obstruction, but this technique is limited by relatively gasless abdomen. Fluid dilated loops of bowel would be missed on this study. 2. Negative chest.   Electronically Signed   By: Jorje Guild M.D.   On: 08/04/2014 06:29    Review of Systems  Constitutional: Negative for fever, chills and diaphoresis.  HENT: Negative.   Eyes: Negative.   Respiratory: Negative.   Cardiovascular: Negative.   Gastrointestinal: Positive for nausea, vomiting, abdominal pain (mid right abdomen, sharp) and diarrhea. Negative for constipation, blood in stool and melena.  Genitourinary: Negative.  Negative for hematuria and flank pain.  Musculoskeletal: Negative.  Negative for back pain.  Skin: Negative.   Neurological: Negative.   Endo/Heme/Allergies: Negative.   Psychiatric/Behavioral: Negative.    Blood pressure 156/85, pulse 109, temperature 98.4 F (36.9 C), temperature source Oral, resp. rate 20, height 6' 2"  (1.88 m), weight 292 lb 8.8 oz (132.7 kg), SpO2 94 %. Physical Exam  Constitutional: He is oriented to person, place, and time. He appears well-developed and well-nourished. He appears distressed.  HENT:  Head: Normocephalic and atraumatic.  Right Ear: External ear normal.  Left Ear: External ear normal.  Eyes: Conjunctivae are normal. Pupils are equal, round, and reactive to light. No scleral icterus.  Neck: Normal range of motion. Neck supple. No tracheal deviation present. No thyromegaly present.  Cardiovascular: Normal rate, regular rhythm, normal heart sounds and intact distal pulses.   No murmur heard. Respiratory: Effort normal and breath sounds normal. He has no wheezes.  GI: Soft. Bowel sounds are normal. He exhibits no distension and no mass. There  is  tenderness (RUQ and right mid abdomen). There is guarding. There is no rebound.  Musculoskeletal: Normal range of motion. He exhibits no edema.  Neurological: He is alert and oriented to person, place, and time.  Skin: Skin is warm and dry. He is not diaphoretic.  Psychiatric: He has a normal mood and affect. His behavior is normal.    Assessment/Plan: Enteritis, likely ischemic, rule out infectious  Agree with contact precautions pending Cdiff testing  Agree with empiric abx with Cipro and Flagyl  Rx for pain and nausea  Keep well hydrated and correct electrolyte abnormalitis  Will allow sips of water and ice chips only Acute renal insufficiency  Likely due to dehydration  Per medical service  Will follow closely with you.  Discussed findings with patient and wife at bedside.  Usual course is self-limiting over 3-4 days.  However, given hx of vascular disease, may develop infarction and require laparotomy and bowel resection.  No sign of that at present with normal lactate level and no pneumatosis on CT scan images.  Again, will follow closely with you for now.  Earnstine Regal, MD, [facility] Office: [place] 08/04/2014, 10:25 AM

## 2014-08-04 NOTE — ED Notes (Signed)
Medication not scanned, computer broken

## 2014-08-04 NOTE — ED Notes (Signed)
Pt currently in the bathroom.

## 2014-08-04 NOTE — ED Notes (Signed)
Pt requesting more fentanyl prior to going to CT

## 2014-08-04 NOTE — ED Notes (Signed)
Pt complains of generalized abdominal pain, he states his abdomen feels tight and crampy, he states that he's had diarrhea for three days.

## 2014-08-05 ENCOUNTER — Inpatient Hospital Stay (HOSPITAL_COMMUNITY): Payer: BLUE CROSS/BLUE SHIELD

## 2014-08-05 DIAGNOSIS — I81 Portal vein thrombosis: Secondary | ICD-10-CM

## 2014-08-05 DIAGNOSIS — K550 Acute vascular disorders of intestine: Principal | ICD-10-CM

## 2014-08-05 DIAGNOSIS — IMO0002 Reserved for concepts with insufficient information to code with codable children: Secondary | ICD-10-CM

## 2014-08-05 DIAGNOSIS — K55069 Acute infarction of intestine, part and extent unspecified: Secondary | ICD-10-CM | POA: Diagnosis present

## 2014-08-05 LAB — BASIC METABOLIC PANEL
Anion gap: 8 (ref 5–15)
BUN: 52 mg/dL — ABNORMAL HIGH (ref 6–23)
CO2: 32 mmol/L (ref 19–32)
Calcium: 7.8 mg/dL — ABNORMAL LOW (ref 8.4–10.5)
Chloride: 98 mEq/L (ref 96–112)
Creatinine, Ser: 2.66 mg/dL — ABNORMAL HIGH (ref 0.50–1.35)
GFR calc Af Amer: 31 mL/min — ABNORMAL LOW (ref >90)
GFR, EST NON AFRICAN AMERICAN: 27 mL/min — AB (ref >90)
Glucose, Bld: 128 mg/dL — ABNORMAL HIGH (ref 70–99)
POTASSIUM: 4.7 mmol/L (ref 3.5–5.1)
Sodium: 138 mmol/L (ref 135–145)

## 2014-08-05 LAB — APTT: APTT: 28 s (ref 24–37)

## 2014-08-05 LAB — CBC
HEMATOCRIT: 47.9 % (ref 39.0–52.0)
Hemoglobin: 14.5 g/dL (ref 13.0–17.0)
MCH: 28.6 pg (ref 26.0–34.0)
MCHC: 30.3 g/dL (ref 30.0–36.0)
MCV: 94.5 fL (ref 78.0–100.0)
Platelets: 161 10*3/uL (ref 150–400)
RBC: 5.07 MIL/uL (ref 4.22–5.81)
RDW: 15.1 % (ref 11.5–15.5)
WBC: 11.4 10*3/uL — ABNORMAL HIGH (ref 4.0–10.5)

## 2014-08-05 LAB — PROTIME-INR
INR: 1.21 (ref 0.00–1.49)
Prothrombin Time: 15.4 seconds — ABNORMAL HIGH (ref 11.6–15.2)

## 2014-08-05 LAB — HEPARIN LEVEL (UNFRACTIONATED): Heparin Unfractionated: 0.18 IU/mL — ABNORMAL LOW (ref 0.30–0.70)

## 2014-08-05 MED ORDER — HEPARIN (PORCINE) IN NACL 100-0.45 UNIT/ML-% IJ SOLN
1600.0000 [IU]/h | INTRAMUSCULAR | Status: DC
Start: 2014-08-05 — End: 2014-08-05
  Administered 2014-08-05: 1600 [IU]/h via INTRAVENOUS
  Filled 2014-08-05 (×2): qty 250

## 2014-08-05 MED ORDER — SODIUM CHLORIDE 0.9 % IV BOLUS (SEPSIS)
1000.0000 mL | INTRAVENOUS | Status: AC
  Administered 2014-08-05: 1000 mL via INTRAVENOUS

## 2014-08-05 MED ORDER — PROMETHAZINE HCL 25 MG/ML IJ SOLN: 12.5000 mg | Freq: Four times a day (QID) | INTRAMUSCULAR | Status: DC | PRN

## 2014-08-05 MED ORDER — HEPARIN BOLUS VIA INFUSION
2000.0000 [IU] | Freq: Once | INTRAVENOUS | Status: AC
  Administered 2014-08-05: 2000 [IU] via INTRAVENOUS
  Filled 2014-08-05: qty 2000

## 2014-08-05 MED ORDER — HEPARIN (PORCINE) IN NACL 100-0.45 UNIT/ML-% IJ SOLN
2200.0000 [IU]/h | INTRAMUSCULAR | Status: DC
  Administered 2014-08-06: 1900 [IU]/h via INTRAVENOUS
  Filled 2014-08-05 (×4): qty 250

## 2014-08-05 MED ORDER — LORAZEPAM 2 MG/ML IJ SOLN
2.0000 mg | Freq: Once | INTRAMUSCULAR | Status: AC
  Administered 2014-08-05: 2 mg via INTRAVENOUS
  Filled 2014-08-05: qty 1

## 2014-08-05 MED ORDER — PROMETHAZINE HCL 25 MG/ML IJ SOLN
12.5000 mg | INTRAMUSCULAR | Status: DC | PRN
  Administered 2014-08-05 – 2014-08-07 (×2): 12.5 mg via INTRAVENOUS
  Administered 2014-08-07: 25 mg via INTRAVENOUS
  Administered 2014-08-07: 12.5 mg via INTRAVENOUS
  Administered 2014-08-08 – 2014-08-10 (×4): 25 mg via INTRAVENOUS
  Administered 2014-08-11: 12.5 mg via INTRAVENOUS
  Filled 2014-08-05 (×10): qty 1

## 2014-08-05 MED ORDER — HEPARIN BOLUS VIA INFUSION
4000.0000 [IU] | Freq: Once | INTRAVENOUS | Status: AC
  Administered 2014-08-05: 4000 [IU] via INTRAVENOUS
  Filled 2014-08-05: qty 4000

## 2014-08-05 NOTE — Progress Notes (Signed)
[facility] PROGRESS NOTE  Dylan [name] JOA:[id number] DOB: Jul 12, 1966 DOA: 08/04/2014 PCP: [name], MD Brief narrative 49 year old obese male with history of coronary artery disease status post angioplasty in 2013 presented with acute abdominal pain for the past 4 days associated with 4-5 episodes of diarrhea and 3 episodes of vomiting on the day of admission. No hematemesis, melena or rectal bleed.  found to have acute kidney injury as well. CT scan of the abdomen and pelvis without contrast in the ED showing  abnormally enlarged thick walled small bowel loops with surrounding inflammation and fluid in the right lower quadrant.  Patient admitted to medical floor with concern for infectious versus ischemic enteritis. Surgery consulted.   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 This was followed with MRA of the abdomen without contrast which shows occlusive thrombus in the superior mesenteric vein extending through the main portal vein associated with abdominal ascites. -Patient on Dilaudid PCA for pain control. Continue supportive care with aggressive IV hydration and antiemetics. -Started on IV heparin and for anticoagulation. Once pain improved will start Coumadin. Will need at least 6 months of anticoagulant. -No clear etiology . -Appreciate surgery evaluation and follow-up. Continue empiric antibiotics. Stool for c diff pending. --Nothing by mouth except for sips with meds -Serial abdominal exam  -Check daily after meals and electrolytes including bicarbonate. Check daily lactic acid. Follow-up abdominal CT in 48 hours rule out signs of transmural necrosis. Monitor closely for abdominal compartment syndrome. -Spoke with [facility]. Recommended vascular surgery evaluation. No surgical intervention besides  current mgmt per vascular ( Dr Oneida Alar). GI will evaluate patient in the morning.   Acute kidney injury Likely prerenal secondary to dehydration. Follow urine lites. Renal ultrasound unremarkable except for old complex renal cyst. Hold Lasix, HCTZ and ARB.  CAD with history of PCI, and NSTEMI Seen by Dr Sallyanne Kuster in past. Has not seen him in almost a year. contineu ASA and BB. Hold other meds    H/O: CVA (cerebrovascular accident), post cardiac cath, minimal speech residual 11/11 post PCI   Hyponatremia Resolved with fluids     Code Status: full code Family Communication: Discussed with wife at bedside Disposition Plan: Inpatient   Consultants:  [place] [facility]  Procedures:  CT abdomen and pelvis without contrast  MRI/MRA of the abdomen without contrast  Ultrasound renal  Antibiotics:  IV Cipro and Flagyl since 1/1  HPI/Subjective: Since seen and examined. In controlled on Dilaudid PCA reports being nauseous and requests lower dose of Phenergan more frequently. Abdomen is more distended today  Objective: Filed Vitals:   08/05/14 1111  BP:   Pulse:   Temp:   Resp: 20    Intake/Output Summary (Last 24 hours) at 08/05/14 1551 Last data filed at 08/05/14 1400  Gross per 24 hour  Intake 2480.83 ml  Output    300 ml  Net 2180.83 ml   Filed Weights   08/04/14 1010  Weight: 132.7 kg (292 lb 8.8 oz)    Exam:   General:  Middle aged obese male lying in bed in no acute distress  HEENT: No pallor, moist oral mucosa  Chest: Clear to auscultation bilaterally  CVS: Normal S1 and S2, no murmurs  Abdomen: Distended, sluggish bowel sounds, tender to palpation over right mid and lower abdomen with guarding  Extremities: Warm, no edema  CNS: Alert and oriented   Data  Reviewed: Basic Metabolic Panel:  Recent Labs Lab 08/04/14 0412 08/05/14 0519  NA 131* 138  K 4.0 4.7  CL 92* 98  CO2 28 32  GLUCOSE 136* 128*  BUN 45* 52*  CREATININE 2.77* 2.66*   CALCIUM 9.2 7.8*   Liver Function Tests:  Recent Labs Lab 08/04/14 0412  AST 34  ALT 39  ALKPHOS 69  BILITOT 0.9  PROT 7.0  ALBUMIN 3.6    Recent Labs Lab 08/04/14 0412  LIPASE 59   No results for input(s): AMMONIA in the last 168 hours. CBC:  Recent Labs Lab 08/04/14 0412 08/05/14 0519  WBC 11.6* 11.4*  NEUTROABS 5.8  --   HGB 14.7 14.5  HCT 46.9 47.9  MCV 92.0 94.5  PLT 177 161   Cardiac Enzymes: No results for input(s): CKTOTAL, CKMB, CKMBINDEX, TROPONINI in the last 168 hours. BNP (last 3 results) No results for input(s): PROBNP in the last 8760 hours. CBG: No results for input(s): GLUCAP in the last 168 hours.  No results found for this or any previous visit (from the past 240 hour(s)).   Studies: Ct Abdomen Pelvis Wo Contrast  08/04/2014   CLINICAL DATA:  Generalized abdomen pain, cramps, nausea, vomiting.  EXAM: CT ABDOMEN AND PELVIS WITHOUT CONTRAST  TECHNIQUE: Multidetector CT imaging of the abdomen and pelvis was performed following the standard protocol without IV contrast. There is oral contrast in the stomach only.  COMPARISON:  July 06, 2002  FINDINGS: The liver, spleen, pancreas, gallbladder, adrenal glands and right kidney are normal. There is no hydronephrosis bilaterally. There is a 2.3 x 3 cm slight low-density lesion in the posterior midpole left kidney with minimal rim calcification ; on the previous CT, a simple cysts was present in the same location. There is atherosclerosis of the abdominal aorta without aneurysmal dilatation. There is no abdominal lymphadenopathy.  There are several loops of abnormal enlarged thick walled small bowel loops with surrounding inflammation and fluid in the right lower quadrant. There is no free air. The appendix is not definitely seen. The colon is normal.  Fluid-filled bladder is normal. Small amount of free fluid is identified in the pelvis. The lung bases are clear. Degenerative joint changes of the spine are  noted.  IMPRESSION: Several loops of abnormal enlarged thick walled small bowel loops with surrounding inflammation and fluid in the right lower quadrant. The findings are nonspecific but can be due to infectious or inflammatory etiology.   Electronically Signed   By: Abelardo Diesel M.D.   On: 08/04/2014 07:37   Mr Abdomen Wo Contrast  08/05/2014   CLINICAL DATA:  Abdominal pain nausea and cramping. eval for mesenteric vein thrombosis. Pt is 300lbs and extremely claus.  EXAM: MRI/ MRA ABDOMEN WITHOUT CONTRAST  TECHNIQUE: Multiplanar, multiecho pulse sequences of the abdomen were obtained WITHOUT intravenous contrast. Angiographic images of abdomen were obtained using MRA technique WITHOUT intravenous contrast.Pre meds were given. Pt unable to stay awake for breath hold sequances. Was attemting to repeat axial ssfse lower in to the pelvis when pt became spastic and crawling out of the scanner. Unable to obtain any further imaging  : COMPARISON:  CT 08/04/2014 AND EARLIER STUDIES  FINDINGS: There is occlusive thrombus through the visualized superior mesenteric vein and main portal vein, terminating at the portal bifurcation. There may be small amount of nonocclusive thrombus extending into the left portal vein. Normal flow signal in the splenic vein.  Limited assessment of the abdominal aorta and proximal visceral and  renal branches grossly unremarkable. IVC is patent.  There is a small amount perihepatic ,perisplenic, right lower quadrant mesenteric, and pelvic ascites. No focal liver lesion identified. 2.6 cm fluid signal partially exophytic probable cyst from the interpolar region left kidney. Right kidney, adrenal glands, pancreas, and nondilated gallbladder unremarkable.  IMPRESSION:  1. Occlusive thrombus in superior mesenteric vein extending through the main portal vein. 2. Abdominal ascites 3. Left renal cyst.   Electronically Signed   By: Arne Cleveland M.D.   On: 08/05/2014 15:34   Mr Jodene Nam Abdomen Wo  Contrast  08/05/2014   CLINICAL DATA:  Abdominal pain nausea and cramping. eval for mesenteric vein thrombosis. Pt is 300lbs and extremely claus.  EXAM: MRI/ MRA ABDOMEN WITHOUT CONTRAST  TECHNIQUE: Multiplanar, multiecho pulse sequences of the abdomen were obtained WITHOUT intravenous contrast. Angiographic images of abdomen were obtained using MRA technique WITHOUT intravenous contrast.Pre meds were given. Pt unable to stay awake for breath hold sequances. Was attemting to repeat axial ssfse lower in to the pelvis when pt became spastic and crawling out of the scanner. Unable to obtain any further imaging  : COMPARISON:  CT 08/04/2014 AND EARLIER STUDIES  FINDINGS: There is occlusive thrombus through the visualized superior mesenteric vein and main portal vein, terminating at the portal bifurcation. There may be small amount of nonocclusive thrombus extending into the left portal vein. Normal flow signal in the splenic vein.  Limited assessment of the abdominal aorta and proximal visceral and renal branches grossly unremarkable. IVC is patent.  There is a small amount perihepatic ,perisplenic, right lower quadrant mesenteric, and pelvic ascites. No focal liver lesion identified. 2.6 cm fluid signal partially exophytic probable cyst from the interpolar region left kidney. Right kidney, adrenal glands, pancreas, and nondilated gallbladder unremarkable.  IMPRESSION:  1. Occlusive thrombus in superior mesenteric vein extending through the main portal vein. 2. Abdominal ascites 3. Left renal cyst.   Electronically Signed   By: Arne Cleveland M.D.   On: 08/05/2014 15:34   US Renal Port  08/05/2014   CLINICAL DATA:  Acute renal injury.  Hypertension.  EXAM: RENAL/URINARY TRACT ULTRASOUND COMPLETE  COMPARISON:  08/04/2014  FINDINGS: Right Kidney:  Length: 13.0 cm. Echogenicity within normal limits. No mass or hydronephrosis visualized.  Left Kidney:  Length: 12.3 cm. Echogenicity within normal limits. No  hydronephrosis. 2.2 by 1.7 cm complex exophytic lesion of the left mid kidney, as shown on CT scan.  Bladder:  Appears normal for degree of bladder distention.  Other: Small amount of ascites adjacent to the right hepatic lobe. 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  IMPRESSION: 1. 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 This could be confirmed with noncontrast 3D time-of-flight MRI of the mesenteric vasculature, if clinically warranted. 2. Small amount of ascites adjacent to the right hepatic lobe. 3. The 2.2 by 1.7 cm exophytic lesion of the left mid kidney is confirmed to be complex by ultrasound. I do note that there was a reasonably benign appearing cyst in this location on 07/06/2002. Although likely benign, the lesion is not completely specific on today's exam. These results were called by telephone at the time of interpretation on 08/05/2014 at 12:42 pm to Dr. Louellen Molder , who verbally acknowledged these results.   Electronically Signed   By: Franchot Mimes  Janeece Fitting M.D.   On: 08/05/2014 12:46   Dg Abd Acute W/chest  08/04/2014   CLINICAL DATA:  Abdominal pain  EXAM: ACUTE ABDOMEN SERIES (ABDOMEN 2 VIEW & CHEST 1 VIEW)  COMPARISON:  04/17/2013 chest x-ray  FINDINGS: The abdomen is essentially gasless, which limits the utility of radiography. Few bubbles noted in the right lower quadrant. There is no concerning intra-abdominal mass effect or calcification. No pneumoperitoneum. Previous lower abdominal wall mesh repair. Normal heart size and mediastinal contours. No acute infiltrate or edema. No effusion or pneumothorax. No acute osseous findings.  IMPRESSION: 1. There is no definitive obstruction, but this technique is limited by relatively gasless abdomen. Fluid dilated loops of bowel would be  missed on this study. 2. Negative chest.   Electronically Signed   By: Jorje Guild M.D.   On: 08/04/2014 06:29   Dg Abd Portable 1v  08/05/2014   CLINICAL DATA:  Subsequent evaluation for right-sided abdominal pain with nausea vomiting and diarrhea  EXAM: PORTABLE ABDOMEN - 1 VIEW  COMPARISON:  08/04/2014  FINDINGS: Mild gaseous distention of the stomach. Minimal gas elsewhere in the abdomen. There is a small amount of oral contrast appreciated throughout the colon.  IMPRESSION: Nonspecific gas pattern.   Electronically Signed   By: Skipper Cliche M.D.   On: 08/05/2014 11:21    Scheduled Meds: . allopurinol  300 mg Oral Daily  . aspirin  81 mg Oral Daily  . ciprofloxacin  400 mg Intravenous Q12H  . heparin  4,000 Units Intravenous Once  . HYDROmorphone PCA 0.3 mg/mL   Intravenous 6 times per day  . metoprolol succinate  50 mg Oral Daily  . metronidazole  500 mg Intravenous Q8H  . omega-3 acid ethyl esters  1 g Oral Daily  . sodium chloride  3 mL Intravenous Q12H   Continuous Infusions: . sodium chloride 125 mL/hr at 08/05/14 1258  . sodium chloride 100 mL/hr at 08/04/14 0907  . heparin       Time spent: 25 minutes    Louellen Molder  [facility] Pager [phone] If 7PM-7AM, please contact night-coverage at [email or web address], password [facility] 08/05/2014, 3:51 PM  LOS: 1 day

## 2014-08-05 NOTE — Progress Notes (Signed)
Patient ID: MACLEAN FOISTER, male   DOB: 12/01/1965, 49 y.o.   MRN: [id number]  General Surgery - [facility] - Progress Note  Subjective: Patient in bed.  Pain in right mid abdomen controlled with dilaudid PCA.  No nausea or emesis.  No flatus or BM's.  No diarrhea.  Objective: Vital signs in last 24 hours: Temp:  [97.7 F (36.5 C)-98.4 F (36.9 C)] 98 F (36.7 C) (01/02 0551) Pulse Rate:  [109-127] 116 (01/02 0551) Resp:  [15-20] 20 (01/02 0744) BP: (106-156)/(66-85) 106/66 mmHg (01/02 0551) SpO2:  [90 %-98 %] 94 % (01/02 0744) Weight:  [292 lb 8.8 oz (132.7 kg)] 292 lb 8.8 oz (132.7 kg) (01/01 1010) Last BM Date: 08/04/14  Intake/Output from previous day: 01/01 0701 - 01/02 0700 In: 1955 [I.V.:1255; IV Piggyback:700] Out: 750 [Urine:750]  Exam: HEENT - clear, not icteric Neck - soft Chest - clear bilaterally Cor - RRR, no murmur Abd - soft, minimal distension; rare BS present; tender to palpation right mid abdomen with guarding, mild rebound, no mass Ext - no significant edema Neuro - grossly intact, no focal deficits  Lab Results:   Recent Labs  08/04/14 0412 08/05/14 0519  WBC 11.6* 11.4*  HGB 14.7 14.5  HCT 46.9 47.9  PLT 177 161     Recent Labs  08/04/14 0412 08/05/14 0519  NA 131* 138  K 4.0 4.7  CL 92* 98  CO2 28 32  GLUCOSE 136* 128*  BUN 45* 52*  CREATININE 2.77* 2.66*  CALCIUM 9.2 7.8*    Studies/Results: Ct Abdomen Pelvis Wo Contrast  08/04/2014   CLINICAL DATA:  Generalized abdomen pain, cramps, nausea, vomiting.  EXAM: CT ABDOMEN AND PELVIS WITHOUT CONTRAST  TECHNIQUE: Multidetector CT imaging of the abdomen and pelvis was performed following the standard protocol without IV contrast. There is oral contrast in the stomach only.  COMPARISON:  July 06, 2002  FINDINGS: The liver, spleen, pancreas, gallbladder, adrenal glands and right kidney are normal. There is no hydronephrosis bilaterally. There is a 2.3 x 3 cm slight  low-density lesion in the posterior midpole left kidney with minimal rim calcification ; on the previous CT, a simple cysts was present in the same location. There is atherosclerosis of the abdominal aorta without aneurysmal dilatation. There is no abdominal lymphadenopathy.  There are several loops of abnormal enlarged thick walled small bowel loops with surrounding inflammation and fluid in the right lower quadrant. There is no free air. The appendix is not definitely seen. The colon is normal.  Fluid-filled bladder is normal. Small amount of free fluid is identified in the pelvis. The lung bases are clear. Degenerative joint changes of the spine are noted.  IMPRESSION: Several loops of abnormal enlarged thick walled small bowel loops with surrounding inflammation and fluid in the right lower quadrant. The findings are nonspecific but can be due to infectious or inflammatory etiology.   Electronically Signed   By: Abelardo Diesel M.D.   On: 08/04/2014 07:37   Dg Abd Acute W/chest  08/04/2014   CLINICAL DATA:  Abdominal pain  EXAM: ACUTE ABDOMEN SERIES (ABDOMEN 2 VIEW & CHEST 1 VIEW)  COMPARISON:  04/17/2013 chest x-ray  FINDINGS: The abdomen is essentially gasless, which limits the utility of radiography. Few bubbles noted in the right lower quadrant. There is no concerning intra-abdominal mass effect or calcification. No pneumoperitoneum. Previous lower abdominal wall mesh repair. Normal heart size and mediastinal contours. No acute infiltrate or edema. No effusion or  pneumothorax. No acute osseous findings.  IMPRESSION: 1. There is no definitive obstruction, but this technique is limited by relatively gasless abdomen. Fluid dilated loops of bowel would be missed on this study. 2. Negative chest.   Electronically Signed   By: Jorje Guild M.D.   On: 08/04/2014 06:29    Assessment / Plan: 1. Enteritis, likely ischemic, rule out infectious Agree with contact precautions pending Cdiff  testing Agree with empiric abx with Cipro and Flagyl Rx for pain and nausea Keep well hydrated and correct electrolyte abnormalities Will allow sips of water and ice chips only  Encouraged OOB, ambulation today 2. Acute renal insufficiency Slight improvement  Likely still relatively dehydrated - will give 1 liter NS bolus this AM  Check labs in AM 1/3  Per medical service  Earnstine Regal, MD, [facility] Office: [phone]  08/05/2014

## 2014-08-05 NOTE — Progress Notes (Signed)
ANTICOAGULATION CONSULT NOTE - Follow Up Consult  Pharmacy Consult for heparin Indication: mesenteric vein thrombosis  Allergies  Allergen Reactions  . Ace Inhibitors Cough  . Lipitor [Atorvastatin] Other (See Comments)    myalgia  . Codeine Itching    Patient Measurements: Height: 6\' 2"  (188 cm) Weight: 292 lb 8.8 oz (132.7 kg) IBW/kg (Calculated) : 82.2 Heparin Dosing Weight: 112kg  Vital Signs: Temp: 97.8 F (36.6 C) (01/02 2040) Temp Source: Oral (01/02 2040) BP: 113/82 mmHg (01/02 2040) Pulse Rate: 113 (01/02 2040)  Labs:  Recent Labs  08/04/14 0412 08/05/14 0519 08/05/14 1312 08/05/14 2100  HGB 14.7 14.5  --   --   HCT 46.9 47.9  --   --   PLT 177 161  --   --   APTT  --   --  28  --   LABPROT  --   --  15.4*  --   INR  --   --  1.21  --   HEPARINUNFRC  --   --   --  0.18*  CREATININE 2.77* 2.66*  --   --     Estimated Creatinine Clearance: 48.7 mL/min (by C-G formula based on Cr of 2.66).   Medical History: Past Medical History  Diagnosis Date  . Hypertension   . Coronary artery disease   . S/P angioplasty with stent (angiosculpt) of RCA for "in stent restenosis" 07/06/2012  . Hypercholesteremia   . Anginal pain   . NSTEMI (non-ST elevated myocardial infarction) 07/2010    r/t total occlusion of RCA (3 Taxus Ion DES)  . Stroke 2011    S/P cardiac cath - embolic stroke; denies residual (07/06/2012)  . Migraines     "maybe one/yr" (07/06/2012)  . 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 04/19/2013    No evidence of CVA on MRI/MRA & Dliated Eye Examination. ruptured blood vessel & not occlusion.    Medications:  Scheduled:  . allopurinol  300 mg Oral Daily  . aspirin  81 mg Oral Daily  . ciprofloxacin  400 mg Intravenous Q12H  . heparin  2,000 Units Intravenous Once  . HYDROmorphone PCA 0.3 mg/mL   Intravenous 6 times per day  . metoprolol  succinate  50 mg Oral Daily  . metronidazole  500 mg Intravenous Q8H  . omega-3 acid ethyl esters  1 g Oral Daily  . sodium chloride  3 mL Intravenous Q12H   Infusions:  . sodium chloride 125 mL/hr at 08/05/14 1258  . sodium chloride 100 mL/hr at 08/04/14 0907  . heparin      Assessment: 28 yoM admitted 1/1 early AM with diffuse abdominal pain and diarrhea x 4 days PTA. Noted Hx CVA in 2011, CAD- s/p PCI in 2013, continued alcohol abuse, for tobacco use. Pt noted to be in ARF on admission. Pharmacy is consulted to dose heparin for mesenteric thrombus. Noted patient is not on anticoagulation PTA.  CT abdomen w/o contrast on 08/04/14 showed nonspecific small bowel loop wall thickening with surrounding fluid and accumulation.  Renal US on 1/2 showed likely SMV thrombosis  MR abdomen is ordered for confirmation of clot   Hgb WNL, plts stable at 161k  SCr slightly improved, remains very elevated at 2.66, CrCl 48 ml/min CG  Baseline INR 1.21 (WNL)  Baseline aPTT 28 sec (WNL)  No bleeding is noted  Noted concomitant aspirin 81mg  daily  1st Heparin level  = 0.18 with heparin drip @ 1600 units/hr  No complications of therapy noted  Goal of Therapy:  Heparin level 0.3-0.7 units/ml Monitor platelets by anticoagulation protocol: Yes   Plan:   Rebolus Heparin 2000 units IV x 1 now then increase heparin infusion to 1900 units/hr  Check heparin level in 6 hr  Daily heparin level & CBC while on heparin  Leone Haven, PharmD 08/05/2014 10:04 PM

## 2014-08-05 NOTE — Progress Notes (Signed)
ANTICOAGULATION CONSULT NOTE - Initial Consult  Pharmacy Consult for heparin Indication: mesenteric vein thrombosis  Allergies  Allergen Reactions  . Ace Inhibitors Cough  . Lipitor [Atorvastatin] Other (See Comments)    myalgia  . Codeine Itching    Patient Measurements: Height: 6\' 2"  (188 cm) Weight: 292 lb 8.8 oz (132.7 kg) IBW/kg (Calculated) : 82.2 Heparin Dosing Weight: 112kg  Vital Signs: Temp: 98 F (36.7 C) (01/02 0551) Temp Source: Oral (01/02 0551) BP: 133/76 mmHg (01/02 0938) Pulse Rate: 111 (01/02 0938)  Labs:  Recent Labs  08/04/14 0412 08/05/14 0519  HGB 14.7 14.5  HCT 46.9 47.9  PLT 177 161  CREATININE 2.77* 2.66*    Estimated Creatinine Clearance: 48.7 mL/min (by C-G formula based on Cr of 2.66).   Medical History: Past Medical History  Diagnosis Date  . Hypertension   . Coronary artery disease   . S/P angioplasty with stent (angiosculpt) of RCA for "in stent restenosis" 07/06/2012  . Hypercholesteremia   . Anginal pain   . NSTEMI (non-ST elevated myocardial infarction) 07/2010    r/t total occlusion of RCA (3 Taxus Ion DES)  . Stroke 2011    S/P cardiac cath - embolic stroke; denies residual (07/06/2012)  . Migraines     "maybe one/yr" (07/06/2012)  . 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 04/19/2013    No evidence of CVA on MRI/MRA & Dliated Eye Examination. ruptured blood vessel & not occlusion.    Medications:  Scheduled:  . allopurinol  300 mg Oral Daily  . aspirin  81 mg Oral Daily  . ciprofloxacin  400 mg Intravenous Q12H  . heparin  5,000 Units Subcutaneous 3 times per day  . HYDROmorphone PCA 0.3 mg/mL   Intravenous 6 times per day  . metoprolol succinate  50 mg Oral Daily  . metronidazole  500 mg Intravenous Q8H  . omega-3 acid ethyl esters  1 g Oral Daily  . sodium chloride  3 mL Intravenous Q12H   Infusions:  . sodium  chloride 125 mL/hr at 08/05/14 1258  . sodium chloride 100 mL/hr at 08/04/14 0907    Assessment: 26 yoM admitted 1/1 early AM with diffuse abdominal pain and diarrhea x 4 days PTA. Noted Hx CVA in 2011, CAD- s/p PCI in 2013, continued alcohol abuse, for tobacco use. Pt noted to be in ARF on admission. Pharmacy is consulted to dose heparin for mesenteric thrombus. Noted patient is not on anticoagulation PTA.  CT abdomen w/o contrast on 08/04/14 showed nonspecific small bowel loop wall thickening with surrounding fluid and accumulation.  Renal US on 1/2 showed likely SMV thrombosis  MR abdomen is ordered for confirmation of clot   Hgb WNL, plts stable at 161k  SCr slightly improved, remains very elevated at 2.66, CrCl 48 ml/min CG  Baseline INR 1.21 (WNL)  Baseline aPTT 28 sec (WNL)  No bleeding is noted  Noted concomitant aspirin 81mg  daily  Noted patient with heparin 5000 units SQ TID ordered on 1/1, patient has been refusing all doses  Noted patient on heparin therapy in 2014 for cardiac catheterization with heparin level with therapeutic range at 0.42 on 1700 units/hr, renal function normal at that time (SCr 0.9)   Goal of Therapy:  Heparin level 0.3-0.7 units/ml Monitor platelets by anticoagulation protocol: Yes   Plan:  - discontinue heparin for VTE prophylaxis - heparin bolus of 4000 units -  heparin gtt to start at 1600 units/hr due to previous heparin levels as noted above - 6 hour heparin level - daily HL and CBC - pharmacy will follow up daily - follow-up plans for long term anticoagulation  Thank you for the consult.  Currie Paris, PharmD, BCPS Pager: [phone] Pharmacy: [phone] 08/05/2014 1:21 PM

## 2014-08-05 NOTE — Progress Notes (Signed)
Report received from previous RN, no changes in assessment noted.  Pt denies pain, N/V at this time. Will continue to monitor. Dylan [name] A

## 2014-08-05 NOTE — Progress Notes (Signed)
IV team attempted 2nd IV site for heparin gtt.  IV team unable at this time to gain access, pt spouse request that someone else try after 2 attempts. Another IV team member to come assess. Dylan [name] A

## 2014-08-06 ENCOUNTER — Inpatient Hospital Stay (HOSPITAL_COMMUNITY): Payer: BLUE CROSS/BLUE SHIELD

## 2014-08-06 LAB — CBC
HCT: 44.3 % (ref 39.0–52.0)
Hemoglobin: 13.8 g/dL (ref 13.0–17.0)
MCH: 29.5 pg (ref 26.0–34.0)
MCHC: 31.2 g/dL (ref 30.0–36.0)
MCV: 94.7 fL (ref 78.0–100.0)
PLATELETS: 195 10*3/uL (ref 150–400)
RBC: 4.68 MIL/uL (ref 4.22–5.81)
RDW: 15 % (ref 11.5–15.5)
WBC: 10.6 10*3/uL — ABNORMAL HIGH (ref 4.0–10.5)

## 2014-08-06 LAB — BASIC METABOLIC PANEL
Anion gap: 6 (ref 5–15)
BUN: 52 mg/dL — ABNORMAL HIGH (ref 6–23)
CALCIUM: 6.8 mg/dL — AB (ref 8.4–10.5)
CHLORIDE: 99 meq/L (ref 96–112)
CO2: 28 mmol/L (ref 19–32)
Creatinine, Ser: 2 mg/dL — ABNORMAL HIGH (ref 0.50–1.35)
GFR calc Af Amer: 43 mL/min — ABNORMAL LOW (ref >90)
GFR, EST NON AFRICAN AMERICAN: 37 mL/min — AB (ref >90)
GLUCOSE: 126 mg/dL — AB (ref 70–99)
POTASSIUM: 4.7 mmol/L (ref 3.5–5.1)
SODIUM: 133 mmol/L — AB (ref 135–145)

## 2014-08-06 LAB — HEPARIN LEVEL (UNFRACTIONATED)
HEPARIN UNFRACTIONATED: 0.28 [IU]/mL — AB (ref 0.30–0.70)
HEPARIN UNFRACTIONATED: 0.22 [IU]/mL — AB (ref 0.30–0.70)
Heparin Unfractionated: 0.14 IU/mL — ABNORMAL LOW (ref 0.30–0.70)

## 2014-08-06 LAB — CREATININE, URINE, RANDOM: Creatinine, Urine: 364 mg/dL

## 2014-08-06 LAB — SODIUM, URINE, RANDOM: Sodium, Ur: 21 mEq/L

## 2014-08-06 LAB — LACTIC ACID, PLASMA: LACTIC ACID, VENOUS: 1.2 mmol/L (ref 0.5–2.2)

## 2014-08-06 MED ORDER — HEPARIN BOLUS VIA INFUSION
2000.0000 [IU] | Freq: Once | INTRAVENOUS | Status: AC
  Administered 2014-08-06: 2000 [IU] via INTRAVENOUS
  Filled 2014-08-06: qty 2000

## 2014-08-06 MED ORDER — HEPARIN (PORCINE) IN NACL 100-0.45 UNIT/ML-% IJ SOLN
2850.0000 [IU]/h | INTRAMUSCULAR | Status: DC
  Administered 2014-08-07 (×3): 2650 [IU]/h via INTRAVENOUS
  Filled 2014-08-06 (×7): qty 250

## 2014-08-06 MED ORDER — HEPARIN BOLUS VIA INFUSION
2000.0000 [IU] | Freq: Once | INTRAVENOUS | Status: AC
  Administered 2014-08-07: 2000 [IU] via INTRAVENOUS
  Filled 2014-08-06: qty 2000

## 2014-08-06 MED ORDER — CALCIUM CARBONATE ANTACID 500 MG PO CHEW
1.0000 | CHEWABLE_TABLET | Freq: Four times a day (QID) | ORAL | Status: DC | PRN
  Administered 2014-08-06 – 2014-08-07 (×4): 200 mg via ORAL
  Filled 2014-08-06 (×5): qty 1

## 2014-08-06 MED ORDER — HEPARIN (PORCINE) IN NACL 100-0.45 UNIT/ML-% IJ SOLN
2400.0000 [IU]/h | INTRAMUSCULAR | Status: DC
  Administered 2014-08-06: 2400 [IU]/h via INTRAVENOUS
  Filled 2014-08-06 (×2): qty 250

## 2014-08-06 MED ORDER — HEPARIN BOLUS VIA INFUSION
3000.0000 [IU] | Freq: Once | INTRAVENOUS | Status: AC
  Administered 2014-08-06: 3000 [IU] via INTRAVENOUS
  Filled 2014-08-06: qty 3000

## 2014-08-06 NOTE — Progress Notes (Signed)
ANTICOAGULATION CONSULT NOTE - Follow Up Consult  Pharmacy Consult for heparin Indication: mesenteric vein thrombosis  Allergies  Allergen Reactions  . Ace Inhibitors Cough  . Lipitor [Atorvastatin] Other (See Comments)    myalgia  . Codeine Itching    Patient Measurements: Height: 6\' 2"  (188 cm) Weight: 292 lb 8.8 oz (132.7 kg) IBW/kg (Calculated) : 82.2 Heparin Dosing Weight: 112 kg  Vital Signs: Temp: 98.1 F (36.7 C) (01/03 0557) Temp Source: Oral (01/03 0557) BP: 132/80 mmHg (01/03 0557) Pulse Rate: 106 (01/03 0557)  Labs:  Recent Labs  08/04/14 0412 08/05/14 0519 08/05/14 1312 08/05/14 2100 08/06/14 0500  HGB 14.7 14.5  --   --  13.8  HCT 46.9 47.9  --   --  44.3  PLT 177 161  --   --  195  APTT  --   --  28  --   --   LABPROT  --   --  15.4*  --   --   INR  --   --  1.21  --   --   HEPARINUNFRC  --   --   --  0.18* 0.14*  CREATININE 2.77* 2.66*  --   --   --     Estimated Creatinine Clearance: 48.7 mL/min (by C-G formula based on Cr of 2.66).   Medications:  Scheduled:  . allopurinol  300 mg Oral Daily  . aspirin  81 mg Oral Daily  . ciprofloxacin  400 mg Intravenous Q12H  . HYDROmorphone PCA 0.3 mg/mL   Intravenous 6 times per day  . metoprolol succinate  50 mg Oral Daily  . metronidazole  500 mg Intravenous Q8H  . omega-3 acid ethyl esters  1 g Oral Daily  . sodium chloride  3 mL Intravenous Q12H   Infusions:  . sodium chloride 125 mL/hr at 08/06/14 0258  . sodium chloride 100 mL/hr at 08/04/14 3474  . heparin 1,900 Units/hr (08/06/14 0438)   PRN: acetaminophen **OR** acetaminophen, calcium carbonate, diphenhydrAMINE **OR** diphenhydrAMINE, naloxone **AND** sodium chloride, nitroGLYCERIN, ondansetron **OR** ondansetron (ZOFRAN) IV, ondansetron (ZOFRAN) IV, promethazine  Assessment: 34 yoM admitted 1/1 early AM with diffuse abdominal pain and diarrhea x 4 days PTA. Noted Hx CVA in 2011, CAD- s/p PCI in 2013, continued alcohol abuse, for  tobacco use. Pt noted to be in ARF on admission. Pharmacy is consulted to dose heparin for mesenteric thrombus. Noted patient is not on anticoagulation PTA.  CT abdomen w/o contrast on 08/04/14 showed nonspecific small bowel loop wall thickening with surrounding fluid and accumulation.  Renal US on 1/2 showed likely SMV thrombosis  -Hgb WNL, PLT =195 -No IV line problem and no bleeding per RN -On concomitant aspirin 81mg  daily  -HL subtherapeutic at 0.14 on 1900 units/hr  Goal of Therapy:  Heparin level 0.3-0.7 units/ml Monitor platelets by anticoagulation protocol: Yes   Plan:  -Rebolus Heparin 3000 units IV x 1 then increase heparin infusion to 2200 units/hr  -Check heparin level in 6 hours -Daily heparin level and CBC while on heparin  Garnet Sierras 08/06/2014,7:01 AM

## 2014-08-06 NOTE — Progress Notes (Signed)
[facility] PROGRESS NOTE  Dylan [name] ZSW:[id number] DOB: Sep 19, 1965 DOA: 08/04/2014 PCP: [name], MD Brief narrative 49 year old obese male with history of coronary artery disease status post angioplasty in 2013 presented with acute abdominal pain for the past 4 days associated with 4-5 episodes of diarrhea and 3 episodes of vomiting on the day of admission. No hematemesis, melena or rectal bleed.  found to have acute kidney injury as well. CT scan of the abdomen and pelvis without contrast in the ED showing  abnormally enlarged thick walled small bowel loops with surrounding inflammation and fluid in the right lower quadrant.  Patient admitted to medical floor with concern for infectious versus ischemic enteritis. Surgery consulted.   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 -Patient on Dilaudid PCA for pain control. Continue supportive care with aggressive IV hydration and antiemetics. -Started on IV heparin   for anticoagulation. We'll start Coumadin once abdominal pain and distention improved and of further surgical risk. Will need at least 6 months of anticoagulant. Hypercoagulable workup not sent and since patient already started on IV heparin should have workup done once of anticoagulation as outpatient. -Appreciate surgery evaluation and follow-up. Continue empiric antibiotics. Stool for c diff ordered but since patient has not had any bowel movement or passing gas will discontinue precautions. --Nothing by mouth except for sips with meds -Serial abdominal exam. Will recheck CT scan of the abdomen tomorrow to check for worsening inflammation and rule out signs of transmural necrosis. A.m. Labs including CBC, electrolytes and lactate normal . -  Monitor closely for abdominal compartment syndrome. -Appreciate surgery and GI evaluation.   Acute kidney injury Likely prerenal secondary to dehydration.  Renal ultrasound unremarkable except for old complex renal cyst. Hold Lasix, HCTZ and ARB. -Creatinine improving today.  CAD with history of PCI, and NSTEMI Seen by Dr Sallyanne Kuster in past. Has not seen him in almost a year. continue ASA and BB. Hold other meds    H/O: CVA (cerebrovascular accident), post cardiac cath, minimal speech residual 11/11 post PCI   Hyponatremia Resolved with fluids     Code Status: full code Family Communication: None at bedside today. Disposition Plan: Inpatient   Consultants:  [place] [facility]  Procedures:  CT abdomen and pelvis without contrast  MRI/MRA of the abdomen without contrast  Ultrasound renal  Antibiotics:  IV Cipro and Flagyl since 1/1  HPI/Subjective: This is seen and examined. Abdominal distention unchanged. Pain controlled on Dilaudid PCA. No nausea or vomiting. Has not passed gas or had bowel movement yet.  Objective: Filed Vitals:   08/06/14 1200  BP:   Pulse:   Temp:   Resp: 16    Intake/Output Summary (Last 24 hours) at 08/06/14 1340 Last data filed at 08/06/14 0901  Gross per 24 hour  Intake 2127.08 ml  Output   1800 ml  Net 327.08 ml   Filed Weights   08/04/14 1010  Weight: 132.7 kg (292 lb 8.8 oz)    Exam:   General:  Middle aged obese male lying in bed in no acute distress  HEENT: No pallor, moist oral mucosa  Chest: Clear to auscultation bilaterally  CVS: Normal S1 and S2, no murmurs  Abdomen: Distended, all sounds improved on exam today, some tenderness on palpation to right mid quadrant, no guarding   Extremities: Warm,  no edema  CNS: Alert and oriented   Data Reviewed: Basic Metabolic Panel:  Recent Labs Lab 08/04/14 0412 Aug 11, 2014 0519 08/06/14 0533  NA 131* 138 133*  K 4.0 4.7 4.7  CL 92* 98 99  CO2 28 32 28   GLUCOSE 136* 128* 126*  BUN 45* 52* 52*  CREATININE 2.77* 2.66* 2.00*  CALCIUM 9.2 7.8* 6.8*   Liver Function Tests:  Recent Labs Lab 08/04/14 0412  AST 34  ALT 39  ALKPHOS 69  BILITOT 0.9  PROT 7.0  ALBUMIN 3.6    Recent Labs Lab 08/04/14 0412  LIPASE 59   No results for input(s): AMMONIA in the last 168 hours. CBC:  Recent Labs Lab 08/04/14 0412 11-Aug-2014 0519 08/06/14 0500  WBC 11.6* 11.4* 10.6*  NEUTROABS 5.8  --   --   HGB 14.7 14.5 13.8  HCT 46.9 47.9 44.3  MCV 92.0 94.5 94.7  PLT 177 161 195   Cardiac Enzymes: No results for input(s): CKTOTAL, CKMB, CKMBINDEX, TROPONINI in the last 168 hours. BNP (last 3 results) No results for input(s): PROBNP in the last 8760 hours. CBG: No results for input(s): GLUCAP in the last 168 hours.  No results found for this or any previous visit (from the past 240 hour(s)).   Studies: Mr Abdomen Wo Contrast  08-11-14   CLINICAL DATA:  Abdominal pain nausea and cramping. eval for mesenteric vein thrombosis. Pt is 300lbs and extremely claus.  EXAM: MRI/ MRA ABDOMEN WITHOUT CONTRAST  TECHNIQUE: Multiplanar, multiecho pulse sequences of the abdomen were obtained WITHOUT intravenous contrast. Angiographic images of abdomen were obtained using MRA technique WITHOUT intravenous contrast.Pre meds were given. Pt unable to stay awake for breath hold sequances. Was attemting to repeat axial ssfse lower in to the pelvis when pt became spastic and crawling out of the scanner. Unable to obtain any further imaging  : COMPARISON:  CT 08/04/2014 AND EARLIER STUDIES  FINDINGS: There is occlusive thrombus through the visualized superior mesenteric vein and main portal vein, terminating at the portal bifurcation. There may be small amount of nonocclusive thrombus extending into the left portal vein. Normal flow signal in the splenic vein.  Limited assessment of the abdominal aorta and proximal visceral and renal branches grossly unremarkable. IVC is  patent.  There is a small amount perihepatic ,perisplenic, right lower quadrant mesenteric, and pelvic ascites. No focal liver lesion identified. 2.6 cm fluid signal partially exophytic probable cyst from the interpolar region left kidney. Right kidney, adrenal glands, pancreas, and nondilated gallbladder unremarkable.  IMPRESSION:  1. Occlusive thrombus in superior mesenteric vein extending through the main portal vein. 2. Abdominal ascites 3. Left renal cyst.   Electronically Signed   By: Arne Cleveland M.D.   On: August 11, 2014 15:34   Mr Jodene Nam Abdomen Wo Contrast  08/11/14   CLINICAL DATA:  Abdominal pain nausea and cramping. eval for mesenteric vein thrombosis. Pt is 300lbs and extremely claus.  EXAM: MRI/ MRA ABDOMEN WITHOUT CONTRAST  TECHNIQUE: Multiplanar, multiecho pulse sequences of the abdomen were obtained WITHOUT intravenous contrast. Angiographic images of abdomen were obtained using MRA technique WITHOUT intravenous contrast.Pre meds were given. Pt unable to stay awake for breath hold sequances. Was attemting to repeat axial ssfse lower in to the pelvis when pt became spastic and crawling out of the scanner. Unable to obtain any further imaging  : COMPARISON:  CT 08/04/2014 AND EARLIER STUDIES  FINDINGS: There is occlusive thrombus through the visualized superior mesenteric vein and main portal vein,  terminating at the portal bifurcation. There may be small amount of nonocclusive thrombus extending into the left portal vein. Normal flow signal in the splenic vein.  Limited assessment of the abdominal aorta and proximal visceral and renal branches grossly unremarkable. IVC is patent.  There is a small amount perihepatic ,perisplenic, right lower quadrant mesenteric, and pelvic ascites. No focal liver lesion identified. 2.6 cm fluid signal partially exophytic probable cyst from the interpolar region left kidney. Right kidney, adrenal glands, pancreas, and nondilated gallbladder unremarkable.  IMPRESSION:   1. Occlusive thrombus in superior mesenteric vein extending through the main portal vein. 2. Abdominal ascites 3. Left renal cyst.   Electronically Signed   By: Arne Cleveland M.D.   On: 08/05/2014 15:34   US Renal Port  08/05/2014   CLINICAL DATA:  Acute renal injury.  Hypertension.  EXAM: RENAL/URINARY TRACT ULTRASOUND COMPLETE  COMPARISON:  08/04/2014  FINDINGS: Right Kidney:  Length: 13.0 cm. Echogenicity within normal limits. No mass or hydronephrosis visualized.  Left Kidney:  Length: 12.3 cm. Echogenicity within normal limits. No hydronephrosis. 2.2 by 1.7 cm complex exophytic lesion of the left mid kidney, as shown on CT scan.  Bladder:  Appears normal for degree of bladder distention.  Other: Small amount of ascites adjacent to the right hepatic lobe. 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  IMPRESSION: 1. 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 This could be confirmed with noncontrast 3D time-of-flight MRI of the mesenteric vasculature, if clinically warranted. 2. Small amount of ascites adjacent to the right hepatic lobe. 3. The 2.2 by 1.7 cm exophytic lesion of the left mid kidney is confirmed to be complex by ultrasound. I do note that there was a reasonably benign appearing cyst in this location on 07/06/2002. Although likely benign, the lesion is not completely specific on today's exam. These results were called by telephone at the time of interpretation on 08/05/2014 at 12:42 pm to Dr. Louellen Molder , who verbally acknowledged these results.   Electronically Signed   By: Sherryl Barters M.D.   On: 08/05/2014 12:46   Dg Abd Portable 1v  08/05/2014   CLINICAL DATA:  Subsequent evaluation for right-sided abdominal pain with nausea vomiting and diarrhea  EXAM: PORTABLE ABDOMEN - 1  VIEW  COMPARISON:  08/04/2014  FINDINGS: Mild gaseous distention of the stomach. Minimal gas elsewhere in the abdomen. There is a small amount of oral contrast appreciated throughout the colon.  IMPRESSION: Nonspecific gas pattern.   Electronically Signed   By: Skipper Cliche M.D.   On: 08/05/2014 11:21    Scheduled Meds: . allopurinol  300 mg Oral Daily  . aspirin  81 mg Oral Daily  . ciprofloxacin  400 mg Intravenous Q12H  . HYDROmorphone PCA 0.3 mg/mL   Intravenous 6 times per day  . metoprolol succinate  50 mg Oral Daily  . metronidazole  500 mg Intravenous Q8H  . omega-3 acid ethyl esters  1 g Oral Daily  . sodium chloride  3 mL Intravenous Q12H   Continuous Infusions: . sodium chloride 125 mL/hr at 08/06/14 1237  . heparin 2,200 Units/hr (08/06/14 0744)     Time spent: 25 minutes    Omauri Boeve, [place]  [facility] Pager [phone] If 7PM-7AM, please contact night-coverage at [email or web address], password [facility] 08/06/2014, 1:40 PM  LOS: 2 days

## 2014-08-06 NOTE — Progress Notes (Signed)
ANTICOAGULATION CONSULT NOTE - Follow Up Consult  Pharmacy Consult for heparin Indication: mesenteric vein thrombosis  Allergies  Allergen Reactions  . Ace Inhibitors Cough  . Lipitor [Atorvastatin] Other (See Comments)    myalgia  . Codeine Itching    Patient Measurements: Height: 6\' 2"  (188 cm) Weight: 292 lb 8.8 oz (132.7 kg) IBW/kg (Calculated) : 82.2 Heparin Dosing Weight: 112 kg  Assessment: 61 yoM admitted 1/1 early AM with diffuse abdominal pain and diarrhea x 4 days PTA. Noted Hx CVA in 2011, CAD- s/p PCI in 2013, continued alcohol abuse, for tobacco use. Pt noted to be in ARF on admission. Pharmacy is consulted to dose heparin for mesenteric thrombus. Noted patient is not on anticoagulation PTA.  CT abdomen w/o contrast on 08/04/14 showed nonspecific small bowel loop wall thickening with surrounding fluid and accumulation.  Renal US on 1/2 showed likely SMV thrombosis  -Hgb WNL, PLT =195 -No IV line problem and no bleeding per RN -On concomitant aspirin 81mg  daily  -HL remains subtherapeutic at 0.28 on 2200 units/hr  Goal of Therapy:  Heparin level 0.3-0.7 units/ml Monitor platelets by anticoagulation protocol: Yes   Plan:  -Rebolus Heparin 2000 units IV x 1 then increase heparin infusion to 2400 units/hr  -Check heparin level in 6 hours -Daily heparin level and CBC while on heparin  Thank you for the consult.  Currie Paris, PharmD, BCPS Pager: [phone] Pharmacy: [phone] 08/06/2014 3:03 PM

## 2014-08-06 NOTE — Progress Notes (Signed)
Patient ID: Dylan [name], male   DOB: [birth year], 49 y.o.   MRN: [id number]  General Surgery - [facility] - Progress Note  Subjective: Patient up in chair.  Pain about the same.  Denies flatus or BM, no diarrhea.  Heparin drip infusing.  Objective: Vital signs in last 24 hours: Temp:  [97.8 F (36.6 C)-98.5 F (36.9 C)] 98.1 F (36.7 C) (01/03 0557) Pulse Rate:  [106-114] 106 (01/03 0557) Resp:  [16-20] 16 (01/03 0745) BP: (110-134)/(70-90) 132/80 mmHg (01/03 0557) SpO2:  [89 %-96 %] 93 % (01/03 0745) Last BM Date: 08/04/14  Intake/Output from previous day: 01/02 0701 - 01/03 0700 In: 3002.1 [I.V.:2302.1; IV Piggyback:700] Out: 1400 [Urine:1400]  Exam: HEENT - clear, not icteric Neck - soft Chest - clear bilaterally Cor - RRR, no murmur Abd - moderate distension; no BS present; moderate tenderness RUQ and right mid abdomen with voluntary guarding Ext - no significant edema Neuro - grossly intact, no focal deficits  Lab Results:   Recent Labs  08/05/14 0519 08/06/14 0500  WBC 11.4* 10.6*  HGB 14.5 13.8  HCT 47.9 44.3  PLT 161 195     Recent Labs  08/05/14 0519 08/06/14 0533  NA 138 133*  K 4.7 4.7  CL 98 99  CO2 32 28  GLUCOSE 128* 126*  BUN 52* 52*  CREATININE 2.66* 2.00*  CALCIUM 7.8* 6.8*    Studies/Results: Mr Abdomen Wo Contrast  08/05/2014   CLINICAL DATA:  Abdominal pain nausea and cramping. eval for mesenteric vein thrombosis. Pt is 300lbs and extremely claus.  EXAM: MRI/ MRA ABDOMEN WITHOUT CONTRAST  TECHNIQUE: Multiplanar, multiecho pulse sequences of the abdomen were obtained WITHOUT intravenous contrast. Angiographic images of abdomen were obtained using MRA technique WITHOUT intravenous contrast.Pre meds were given. Pt unable to stay awake for breath hold sequances. Was attemting to repeat axial ssfse lower in to the pelvis when pt became spastic and crawling out of the scanner. Unable to obtain any further imaging  :  COMPARISON:  CT 08/04/2014 AND EARLIER STUDIES  FINDINGS: There is occlusive thrombus through the visualized superior mesenteric vein and main portal vein, terminating at the portal bifurcation. There may be small amount of nonocclusive thrombus extending into the left portal vein. Normal flow signal in the splenic vein.  Limited assessment of the abdominal aorta and proximal visceral and renal branches grossly unremarkable. IVC is patent.  There is a small amount perihepatic ,perisplenic, right lower quadrant mesenteric, and pelvic ascites. No focal liver lesion identified. 2.6 cm fluid signal partially exophytic probable cyst from the interpolar region left kidney. Right kidney, adrenal glands, pancreas, and nondilated gallbladder unremarkable.  IMPRESSION:  1. Occlusive thrombus in superior mesenteric vein extending through the main portal vein. 2. Abdominal ascites 3. Left renal cyst.   Electronically Signed   By: Arne Cleveland M.D.   On: 08/05/2014 15:34   Mr Jodene Nam Abdomen Wo Contrast  08/05/2014   CLINICAL DATA:  Abdominal pain nausea and cramping. eval for mesenteric vein thrombosis. Pt is 300lbs and extremely claus.  EXAM: MRI/ MRA ABDOMEN WITHOUT CONTRAST  TECHNIQUE: Multiplanar, multiecho pulse sequences of the abdomen were obtained WITHOUT intravenous contrast. Angiographic images of abdomen were obtained using MRA technique WITHOUT intravenous contrast.Pre meds were given. Pt unable to stay awake for breath hold sequances. Was attemting to repeat axial ssfse lower in to the pelvis when pt became spastic and crawling out of the scanner. Unable to obtain any further imaging  :  COMPARISON:  CT 08/04/2014 AND EARLIER STUDIES  FINDINGS: There is occlusive thrombus through the visualized superior mesenteric vein and main portal vein, terminating at the portal bifurcation. There may be small amount of nonocclusive thrombus extending into the left portal vein. Normal flow signal in the splenic vein.  Limited  assessment of the abdominal aorta and proximal visceral and renal branches grossly unremarkable. IVC is patent.  There is a small amount perihepatic ,perisplenic, right lower quadrant mesenteric, and pelvic ascites. No focal liver lesion identified. 2.6 cm fluid signal partially exophytic probable cyst from the interpolar region left kidney. Right kidney, adrenal glands, pancreas, and nondilated gallbladder unremarkable.  IMPRESSION:  1. Occlusive thrombus in superior mesenteric vein extending through the main portal vein. 2. Abdominal ascites 3. Left renal cyst.   Electronically Signed   By: Arne Cleveland M.D.   On: 08/05/2014 15:34   US Renal Port  08/05/2014   CLINICAL DATA:  Acute renal injury.  Hypertension.  EXAM: RENAL/URINARY TRACT ULTRASOUND COMPLETE  COMPARISON:  08/04/2014  FINDINGS: Right Kidney:  Length: 13.0 cm. Echogenicity within normal limits. No mass or hydronephrosis visualized.  Left Kidney:  Length: 12.3 cm. Echogenicity within normal limits. No hydronephrosis. 2.2 by 1.7 cm complex exophytic lesion of the left mid kidney, as shown on CT scan.  Bladder:  Appears normal for degree of bladder distention.  Other: Small amount of ascites adjacent to the right hepatic lobe. 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  IMPRESSION: 1. 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 This could be confirmed with noncontrast 3D time-of-flight MRI of the mesenteric vasculature, if clinically warranted. 2. Small amount of ascites adjacent to the right hepatic lobe. 3. The 2.2 by 1.7 cm exophytic lesion of the left mid kidney is confirmed to be complex by ultrasound. I do note that there was a reasonably benign appearing cyst in this location on 07/06/2002. Although likely benign, the lesion is not  completely specific on today's exam. These results were called by telephone at the time of interpretation on 08/05/2014 at 12:42 pm to Dr. Louellen Molder , who verbally acknowledged these results.   Electronically Signed   By: Sherryl Barters M.D.   On: 08/05/2014 12:46   Dg Abd Portable 1v  08/05/2014   CLINICAL DATA:  Subsequent evaluation for right-sided abdominal pain with nausea vomiting and diarrhea  EXAM: PORTABLE ABDOMEN - 1 VIEW  COMPARISON:  08/04/2014  FINDINGS: Mild gaseous distention of the stomach. Minimal gas elsewhere in the abdomen. There is a small amount of oral contrast appreciated throughout the colon.  IMPRESSION: Nonspecific gas pattern.   Electronically Signed   By: Skipper Cliche M.D.   On: 08/05/2014 11:21    Assessment / Plan: 1. Ischemic enteritis secondary to mesenteric venous thrombosis (SMV and PV) Anticoagulated with IV heparin Agree with empiric abx with Cipro and Flagyl Rx for pain and nausea Keep well hydrated and correct electrolyte abnormalities Will allow sips of water and ice chips only Encouraged OOB, ambulation today 2. Acute renal insufficiency Continued improvement Per medical service  No sign of intestinal infarction at present - normal lactate, not toxic, normal potassium, not acidotic.  Will follow closely with you.  Hopefully can avoid laparotomy and small bowel resection.  Discussed with patient this AM.  Merlinda Frederick.  Harlow Asa, MD, [facility] Office: [phone]  08/06/2014

## 2014-08-06 NOTE — Consult Note (Signed)
[facility] Consult Note  Referring Provider: No ref. provider found Primary Care Physician:  [name], MD Primary Gastroenterologist:  Dr.  Laurel Dimmer Complaint: Abdominal pain HPI: Dylan [name] is an 49 y.o. white male  with a acute onset of periumbilical abdominal pain moving to the right lower quadrant with initial vomiting and to 3 episodes of diarrhea. However he has not had a bowel movement 3 days. He was found to have a segment of thickened small bowel on CT scan. He was found to have suggestion of the superior mesentery enteric vein thrombosis on ultrasound and MRA follow-up confirmed this. He was started on antibiotics and anticoagulants and surgical consultation obtained. He has no prior history of abnormal clotting but his father had pulmonary embolism. He has not had any rectal bleeding. The colon appeared normal on CT scan. He rates his pain as a level VIII and has not really had a noticeable improvement since he came in. His abdomen feels distended to him.  Past Medical History  Diagnosis Date  . Hypertension   . Coronary artery disease   . S/P angioplasty with stent (angiosculpt) of RCA for "in stent restenosis" 07/06/2012  . Hypercholesteremia   . Anginal pain   . NSTEMI (non-ST elevated myocardial infarction) 07/2010    r/t total occlusion of RCA (3 Taxus Ion DES)  . Stroke 2011    S/P cardiac cath - embolic stroke; denies residual (07/06/2012)  . Migraines     "maybe one/yr" (07/06/2012)  . 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 04/19/2013    No evidence of CVA on MRI/MRA & Dliated Eye Examination. ruptured blood vessel & not occlusion.    Past Surgical History  Procedure Laterality Date  . Septoplasty  2001  . Coronary angioplasty with stent placement  07/2010    inferior wall MI - 3 Taxus Ion DES (3.0x68m, 3.0x272m 3.5x1235mto prox and mid RCA  .  Coronary angioplasty  07/06/2012    in-stent restenotic lesion in RCA 80%   . Cardiac catheterization  04/18/2013  . Left heart catheterization with coronary angiogram N/A 06/11/2012    Procedure: LEFT HEART CATHETERIZATION WITH CORONARY ANGIOGRAM;  Surgeon: MihSanda KleinD;  [facility];  Service: Cardiovascular;  Laterality: N/A;  . Percutaneous coronary stent intervention (pci-s) N/A 07/06/2012    Procedure: PERCUTANEOUS CORONARY STENT INTERVENTION (PCI-S);  Surgeon: JonLorretta HarpD;  [facility];  Service: Cardiovascular;  Laterality: N/A;  . Left heart catheterization with coronary angiogram N/A 04/18/2013    Procedure: LEFT HEART CATHETERIZATION WITH CORONARY ANGIOGRAM;  Surgeon: ThoTroy SineD;  [facility];  Service: Cardiovascular;  Laterality: N/A;    Medications Prior to Admission  Medication Sig Dispense Refill  . allopurinol (ZYLOPRIM) 300 MG tablet Take 1 tablet by mouth daily.  4  . aspirin 81 MG chewable tablet Chew 1 tablet (81 mg total) by mouth daily.    . aMarland Kitchenithromycin (ZITHROMAX) 500 MG tablet See admin instructions. 3 tabs on day 1, 2 tabs daily on days 2 and 3  0  . cefdinir (OMNICEF) 300 MG capsule Take 1 capsule by mouth 2 (two) times daily.  0  . furosemide (LASIX) 20 MG tablet Take 20 mg by mouth daily.    . Liraglutide -Weight Management (SAXENDA) 18 MG/3ML SOPN Inject 3 mLs into the skin daily.    .Marland Kitchen  metoprolol succinate (TOPROL-XL) 50 MG 24 hr tablet Take 50 mg by mouth daily. Take with or immediately following a meal.    . olmesartan-hydrochlorothiazide (BENICAR HCT) 40-25 MG per tablet Take 1 tablet by mouth daily.    Marland Kitchen omega-3 acid ethyl esters (LOVAZA) 1 G capsule Take 1 capsule (1 g total) by mouth daily. <please make appointment> 30 capsule 1  . predniSONE (STERAPRED UNI-PAK) 5 MG TABS tablet See admin instructions. 6 tabs on the first day, decreasing by 1 tab daily until gone  1  . testosterone cypionate (DEPOTESTOTERONE  CYPIONATE) 200 MG/ML injection Inject 1 mL into the muscle once a week. Wednesday  0  . acetaminophen (TYLENOL) 325 MG tablet Take 2 tablets (650 mg total) by mouth every 4 (four) hours as needed.    . fenofibrate 160 MG tablet Take 1 tablet (160 mg total) by mouth daily. (Patient not taking: Reported on 08/04/2014) 30 tablet 5  . metoprolol succinate (TOPROL-XL) 25 MG 24 hr tablet Take 1 tablet (25 mg total) by mouth daily. <please make appointment> (Patient not taking: Reported on 08/04/2014) 30 tablet 1  . nitroGLYCERIN (NITROSTAT) 0.4 MG SL tablet Place 1 tablet (0.4 mg total) under the tongue every 5 (five) minutes x 3 doses as needed for chest pain. 25 tablet 2    Allergies:  Allergies  Allergen Reactions  . Ace Inhibitors Cough  . Lipitor [Atorvastatin] Other (See Comments)    myalgia  . Codeine Itching    Family History  Problem Relation Age of Onset  . Atrial fibrillation Mother   . Mitral valve prolapse Mother   . Coronary artery disease Father     CABG, aortic aneursym,    Social History:  reports that he quit smoking about 4 years ago. His smoking use included Cigarettes. He has a 15 pack-year smoking history. He quit smokeless tobacco use about 4 years ago. His smokeless tobacco use included Chew. He reports that he drinks about 16.8 oz of alcohol per week. He reports that he does not use illicit drugs.  Review of Systems: negative except as above   Blood pressure 132/80, pulse 106, temperature 98.1 F (36.7 C), temperature source Oral, resp. rate 18, height 6' 2"  (1.88 m), weight 132.7 kg (292 lb 8.8 oz), SpO2 94 %. Head: Normocephalic, without obvious abnormality, atraumatic Neck: no adenopathy, no carotid bruit, no JVD, supple, symmetrical, trachea midline and thyroid not enlarged, symmetric, no tenderness/mass/nodules Resp: clear to auscultation bilaterally Cardio: regular rate and rhythm, S1, S2 normal, no murmur, click, rub or gallop GI: Abdomen symmetrically  distended somewhat taut, with hypoactive bowel sounds. There is tenderness throughout the abdomen with palpation in the left mid abdomen causing pain in the right lower quadrant. There is also mild rebound tenderness in the left and right abdomen. Extremities: extremities normal, atraumatic, no cyanosis or edema  Results for orders placed or performed during the hospital encounter of 08/04/14 (from the past 48 hour(s))  Lactic acid, plasma     Status: None   Collection Time: 08/04/14  8:17 AM  Result Value Ref Range   Lactic Acid, Venous 1.9 0.5 - 2.2 mmol/L  CBC     Status: Abnormal   Collection Time: 08/05/14  5:19 AM  Result Value Ref Range   WBC 11.4 (H) 4.0 - 10.5 K/uL   RBC 5.07 4.22 - 5.81 MIL/uL   Hemoglobin 14.5 13.0 - 17.0 g/dL   HCT 47.9 39.0 - 52.0 %   MCV 94.5 78.0 -  100.0 fL   MCH 28.6 26.0 - 34.0 pg   MCHC 30.3 30.0 - 36.0 g/dL   RDW 15.1 11.5 - 15.5 %   Platelets 161 150 - 400 K/uL  Basic metabolic panel     Status: Abnormal   Collection Time: 08/05/14  5:19 AM  Result Value Ref Range   Sodium 138 135 - 145 mmol/L    Comment: Please note change in reference range. DELTA CHECK NOTED    Potassium 4.7 3.5 - 5.1 mmol/L    Comment: Please note change in reference range.   Chloride 98 96 - 112 mEq/L   CO2 32 19 - 32 mmol/L   Glucose, Bld 128 (H) 70 - 99 mg/dL   BUN 52 (H) 6 - 23 mg/dL   Creatinine, Ser 2.66 (H) 0.50 - 1.35 mg/dL   Calcium 7.8 (L) 8.4 - 10.5 mg/dL   GFR calc non Af Amer 27 (L) >90 mL/min   GFR calc Af Amer 31 (L) >90 mL/min    Comment: (NOTE) The eGFR has been calculated using the CKD EPI equation. This calculation has not been validated in all clinical situations. eGFR's persistently <90 mL/min signify possible Chronic Kidney Disease.    Anion gap 8 5 - 15  Protime-INR     Status: Abnormal   Collection Time: 08/05/14  1:12 PM  Result Value Ref Range   Prothrombin Time 15.4 (H) 11.6 - 15.2 seconds   INR 1.21 0.00 - 1.49  APTT     Status: None    Collection Time: 08/05/14  1:12 PM  Result Value Ref Range   aPTT 28 24 - 37 seconds  Heparin level (unfractionated)     Status: Abnormal   Collection Time: 08/05/14  9:00 PM  Result Value Ref Range   Heparin Unfractionated 0.18 (L) 0.30 - 0.70 IU/mL    Comment:        IF HEPARIN RESULTS ARE BELOW EXPECTED VALUES, AND PATIENT DOSAGE HAS BEEN CONFIRMED, SUGGEST FOLLOW UP TESTING OF ANTITHROMBIN III LEVELS.   CBC     Status: Abnormal   Collection Time: 08/06/14  5:00 AM  Result Value Ref Range   WBC 10.6 (H) 4.0 - 10.5 K/uL   RBC 4.68 4.22 - 5.81 MIL/uL   Hemoglobin 13.8 13.0 - 17.0 g/dL   HCT 44.3 39.0 - 52.0 %   MCV 94.7 78.0 - 100.0 fL   MCH 29.5 26.0 - 34.0 pg   MCHC 31.2 30.0 - 36.0 g/dL   RDW 15.0 11.5 - 15.5 %   Platelets 195 150 - 400 K/uL  Heparin level (unfractionated)     Status: Abnormal   Collection Time: 08/06/14  5:00 AM  Result Value Ref Range   Heparin Unfractionated 0.14 (L) 0.30 - 0.70 IU/mL    Comment:        IF HEPARIN RESULTS ARE BELOW EXPECTED VALUES, AND PATIENT DOSAGE HAS BEEN CONFIRMED, SUGGEST FOLLOW UP TESTING OF ANTITHROMBIN III LEVELS.   Lactic acid, plasma     Status: None   Collection Time: 08/06/14  5:00 AM  Result Value Ref Range   Lactic Acid, Venous 1.2 0.5 - 2.2 mmol/L   Ct Abdomen Pelvis Wo Contrast  08/04/2014   CLINICAL DATA:  Generalized abdomen pain, cramps, nausea, vomiting.  EXAM: CT ABDOMEN AND PELVIS WITHOUT CONTRAST  TECHNIQUE: Multidetector CT imaging of the abdomen and pelvis was performed following the standard protocol without IV contrast. There is oral contrast in the stomach only.  COMPARISON:  July 06, 2002  FINDINGS: The liver, spleen, pancreas, gallbladder, adrenal glands and right kidney are normal. There is no hydronephrosis bilaterally. There is a 2.3 x 3 cm slight low-density lesion in the posterior midpole left kidney with minimal rim calcification ; on the previous CT, a simple cysts was present in the same  location. There is atherosclerosis of the abdominal aorta without aneurysmal dilatation. There is no abdominal lymphadenopathy.  There are several loops of abnormal enlarged thick walled small bowel loops with surrounding inflammation and fluid in the right lower quadrant. There is no free air. The appendix is not definitely seen. The colon is normal.  Fluid-filled bladder is normal. Small amount of free fluid is identified in the pelvis. The lung bases are clear. Degenerative joint changes of the spine are noted.  IMPRESSION: Several loops of abnormal enlarged thick walled small bowel loops with surrounding inflammation and fluid in the right lower quadrant. The findings are nonspecific but can be due to infectious or inflammatory etiology.   Electronically Signed   By: Abelardo Diesel M.D.   On: 08/04/2014 07:37   Mr Abdomen Wo Contrast  08/05/2014   CLINICAL DATA:  Abdominal pain nausea and cramping. eval for mesenteric vein thrombosis. Pt is 300lbs and extremely claus.  EXAM: MRI/ MRA ABDOMEN WITHOUT CONTRAST  TECHNIQUE: Multiplanar, multiecho pulse sequences of the abdomen were obtained WITHOUT intravenous contrast. Angiographic images of abdomen were obtained using MRA technique WITHOUT intravenous contrast.Pre meds were given. Pt unable to stay awake for breath hold sequances. Was attemting to repeat axial ssfse lower in to the pelvis when pt became spastic and crawling out of the scanner. Unable to obtain any further imaging  : COMPARISON:  CT 08/04/2014 AND EARLIER STUDIES  FINDINGS: There is occlusive thrombus through the visualized superior mesenteric vein and main portal vein, terminating at the portal bifurcation. There may be small amount of nonocclusive thrombus extending into the left portal vein. Normal flow signal in the splenic vein.  Limited assessment of the abdominal aorta and proximal visceral and renal branches grossly unremarkable. IVC is patent.  There is a small amount perihepatic  ,perisplenic, right lower quadrant mesenteric, and pelvic ascites. No focal liver lesion identified. 2.6 cm fluid signal partially exophytic probable cyst from the interpolar region left kidney. Right kidney, adrenal glands, pancreas, and nondilated gallbladder unremarkable.  IMPRESSION:  1. Occlusive thrombus in superior mesenteric vein extending through the main portal vein. 2. Abdominal ascites 3. Left renal cyst.   Electronically Signed   By: Arne Cleveland M.D.   On: 08/05/2014 15:34   Mr Jodene Nam Abdomen Wo Contrast  08/05/2014   CLINICAL DATA:  Abdominal pain nausea and cramping. eval for mesenteric vein thrombosis. Pt is 300lbs and extremely claus.  EXAM: MRI/ MRA ABDOMEN WITHOUT CONTRAST  TECHNIQUE: Multiplanar, multiecho pulse sequences of the abdomen were obtained WITHOUT intravenous contrast. Angiographic images of abdomen were obtained using MRA technique WITHOUT intravenous contrast.Pre meds were given. Pt unable to stay awake for breath hold sequances. Was attemting to repeat axial ssfse lower in to the pelvis when pt became spastic and crawling out of the scanner. Unable to obtain any further imaging  : COMPARISON:  CT 08/04/2014 AND EARLIER STUDIES  FINDINGS: There is occlusive thrombus through the visualized superior mesenteric vein and main portal vein, terminating at the portal bifurcation. There may be small amount of nonocclusive thrombus extending into the left portal vein. Normal flow signal in the splenic vein.  Limited assessment of the  abdominal aorta and proximal visceral and renal branches grossly unremarkable. IVC is patent.  There is a small amount perihepatic ,perisplenic, right lower quadrant mesenteric, and pelvic ascites. No focal liver lesion identified. 2.6 cm fluid signal partially exophytic probable cyst from the interpolar region left kidney. Right kidney, adrenal glands, pancreas, and nondilated gallbladder unremarkable.  IMPRESSION:  1. Occlusive thrombus in superior  mesenteric vein extending through the main portal vein. 2. Abdominal ascites 3. Left renal cyst.   Electronically Signed   By: Arne Cleveland M.D.   On: 08/05/2014 15:34   US Renal Port  08/05/2014   CLINICAL DATA:  Acute renal injury.  Hypertension.  EXAM: RENAL/URINARY TRACT ULTRASOUND COMPLETE  COMPARISON:  08/04/2014  FINDINGS: Right Kidney:  Length: 13.0 cm. Echogenicity within normal limits. No mass or hydronephrosis visualized.  Left Kidney:  Length: 12.3 cm. Echogenicity within normal limits. No hydronephrosis. 2.2 by 1.7 cm complex exophytic lesion of the left mid kidney, as shown on CT scan.  Bladder:  Appears normal for degree of bladder distention.  Other: Small amount of ascites adjacent to the right hepatic lobe. 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  IMPRESSION: 1. 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 This could be confirmed with noncontrast 3D time-of-flight MRI of the mesenteric vasculature, if clinically warranted. 2. Small amount of ascites adjacent to the right hepatic lobe. 3. The 2.2 by 1.7 cm exophytic lesion of the left mid kidney is confirmed to be complex by ultrasound. I do note that there was a reasonably benign appearing cyst in this location on 07/06/2002. Although likely benign, the lesion is not completely specific on today's exam. These results were called by telephone at the time of interpretation on 08/05/2014 at 12:42 pm to Dr. Louellen Molder , who verbally acknowledged these results.   Electronically Signed   By: Sherryl Barters M.D.   On: 08/05/2014 12:46   Dg Abd Portable 1v  08/05/2014   CLINICAL DATA:  Subsequent evaluation for right-sided abdominal pain with nausea vomiting and diarrhea  EXAM: PORTABLE ABDOMEN - 1 VIEW  COMPARISON:  08/04/2014   FINDINGS: Mild gaseous distention of the stomach. Minimal gas elsewhere in the abdomen. There is a small amount of oral contrast appreciated throughout the colon.  IMPRESSION: Nonspecific gas pattern.   Electronically Signed   By: Skipper Cliche M.D.   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 No laboratory or other objective signs of clinical deterioration but abdomen is distended and tender without much subjective improvement and no bowel movement in 3 days Plan:  Agree with anticoagulation and antibiotics, follow-up CT or KUB might be reasonable and assessing whether he is worsening if this cannot be ascertained on clinical grounds. We will leave medical versus surgical management largely to general surgery based on his course. At some point he will definitely need a workup for a hypercoagulable state Chatara Lucente C 08/06/2014, 6:58 AM

## 2014-08-06 NOTE — Progress Notes (Signed)
ANTICOAGULATION CONSULT NOTE - Follow Up Consult  Pharmacy Consult for heparin Indication: mesenteric vein thrombosis  Allergies  Allergen Reactions  . Ace Inhibitors Cough  . Lipitor [Atorvastatin] Other (See Comments)    myalgia  . Codeine Itching    Patient Measurements: Height: 6\' 2"  (188 cm) Weight: 292 lb 8.8 oz (132.7 kg) IBW/kg (Calculated) : 82.2 Heparin Dosing Weight: 112 kg  Assessment: 88 yoM admitted 1/1 early AM with diffuse abdominal pain and diarrhea x 4 days PTA. Noted Hx CVA in 2011, CAD- s/p PCI in 2013, continued alcohol abuse, for tobacco use. Pt noted to be in ARF on admission. Pharmacy is consulted to dose heparin for mesenteric thrombus. Noted patient is not on anticoagulation PTA.  CT abdomen w/o contrast on 08/04/14 showed nonspecific small bowel loop wall thickening with surrounding fluid and accumulation.  Renal US on 1/2 showed likely SMV thrombosis  -Hgb WNL, PLT =195 -On concomitant aspirin 81mg  daily  -HL remains subtherapeutic at 0.22 on 2400 units/hr -No complications of therapy noted  Goal of Therapy:  Heparin level 0.3-0.7 units/ml Monitor platelets by anticoagulation protocol: Yes   Plan:  -Rebolus Heparin 2000 units IV x 1 then increase heparin infusion to 2650 units/hr  -Check heparin level in 6 hours -Daily heparin level and CBC while on heparin  Leone Haven, PharmD 08/06/2014 11:49 PM

## 2014-08-07 ENCOUNTER — Inpatient Hospital Stay (HOSPITAL_COMMUNITY): Payer: BLUE CROSS/BLUE SHIELD

## 2014-08-07 LAB — CBC
HCT: 42.7 % (ref 39.0–52.0)
HEMOGLOBIN: 13.7 g/dL (ref 13.0–17.0)
MCH: 30.1 pg (ref 26.0–34.0)
MCHC: 32.1 g/dL (ref 30.0–36.0)
MCV: 93.8 fL (ref 78.0–100.0)
Platelets: 233 10*3/uL (ref 150–400)
RBC: 4.55 MIL/uL (ref 4.22–5.81)
RDW: 14.8 % (ref 11.5–15.5)
WBC: 8 10*3/uL (ref 4.0–10.5)

## 2014-08-07 LAB — LACTIC ACID, PLASMA: Lactic Acid, Venous: 1 mmol/L (ref 0.5–2.2)

## 2014-08-07 LAB — BASIC METABOLIC PANEL
Anion gap: 3 — ABNORMAL LOW (ref 5–15)
BUN: 35 mg/dL — ABNORMAL HIGH (ref 6–23)
CO2: 30 mmol/L (ref 19–32)
CREATININE: 1.54 mg/dL — AB (ref 0.50–1.35)
Calcium: 7.2 mg/dL — ABNORMAL LOW (ref 8.4–10.5)
Chloride: 99 mEq/L (ref 96–112)
GFR calc Af Amer: 60 mL/min — ABNORMAL LOW (ref >90)
GFR, EST NON AFRICAN AMERICAN: 51 mL/min — AB (ref >90)
Glucose, Bld: 122 mg/dL — ABNORMAL HIGH (ref 70–99)
Potassium: 4.4 mmol/L (ref 3.5–5.1)
Sodium: 132 mmol/L — ABNORMAL LOW (ref 135–145)

## 2014-08-07 LAB — HEPARIN LEVEL (UNFRACTIONATED)
HEPARIN UNFRACTIONATED: 0.36 [IU]/mL (ref 0.30–0.70)
Heparin Unfractionated: 0.37 IU/mL (ref 0.30–0.70)

## 2014-08-07 MED ORDER — FAMOTIDINE 20 MG PO TABS
20.0000 mg | ORAL_TABLET | Freq: Two times a day (BID) | ORAL | Status: DC | PRN
  Filled 2014-08-07: qty 1

## 2014-08-07 MED ORDER — PROMETHAZINE HCL 25 MG/ML IJ SOLN
12.5000 mg | Freq: Once | INTRAMUSCULAR | Status: AC
  Administered 2014-08-07: 12.5 mg via INTRAVENOUS

## 2014-08-07 MED ORDER — FAMOTIDINE IN NACL 20-0.9 MG/50ML-% IV SOLN
20.0000 mg | Freq: Two times a day (BID) | INTRAVENOUS | Status: DC
  Administered 2014-08-07 – 2014-08-11 (×9): 20 mg via INTRAVENOUS
  Filled 2014-08-07 (×10): qty 50

## 2014-08-07 MED ORDER — IOHEXOL 300 MG/ML  SOLN
25.0000 mL | INTRAMUSCULAR | Status: AC
  Administered 2014-08-07 (×2): 25 mL via ORAL

## 2014-08-07 NOTE — Progress Notes (Signed)
[facility] Progress Note  Subjective: The patient still complains of generalized abdominal discomfort. He also feels generalized abdominal bloating.  Objective: Vital signs in last 24 hours: Temp:  [98 F (36.7 C)-98.5 F (36.9 C)] 98.5 F (36.9 C) (01/04 0528) Pulse Rate:  [102-108] 102 (01/04 0528) Resp:  [16] 16 (01/04 0800) BP: (123-131)/(76-79) 123/79 mmHg (01/04 0528) SpO2:  [93 %-100 %] 100 % (01/04 0800) Weight change:    PE:  He is alert. He is in no distress.  Heart regular rhythm no murmurs  Lungs clear  Abdomen: Bowel sounds diminished but present, tympanitic, soft, mild diffuse tenderness, but no rebound  Labs: No elevation of white count. Lactic acid normal.  Lab Results: Results for orders placed or performed during the hospital encounter of 08/04/14 (from the past 24 hour(s))  Heparin level (unfractionated)     Status: Abnormal   Collection Time: 08/06/14  2:20 PM  Result Value Ref Range   Heparin Unfractionated 0.28 (L) 0.30 - 0.70 IU/mL  Heparin level (unfractionated)     Status: Abnormal   Collection Time: 08/06/14 10:00 PM  Result Value Ref Range   Heparin Unfractionated 0.22 (L) 0.30 - 0.70 IU/mL  CBC     Status: None   Collection Time: 08/07/14  6:10 AM  Result Value Ref Range   WBC 8.0 4.0 - 10.5 K/uL   RBC 4.55 4.22 - 5.81 MIL/uL   Hemoglobin 13.7 13.0 - 17.0 g/dL   HCT 42.7 39.0 - 52.0 %   MCV 93.8 78.0 - 100.0 fL   MCH 30.1 26.0 - 34.0 pg   MCHC 32.1 30.0 - 36.0 g/dL   RDW 14.8 11.5 - 15.5 %   Platelets 233 150 - 400 K/uL  Basic metabolic panel     Status: Abnormal   Collection Time: 08/07/14  6:10 AM  Result Value Ref Range   Sodium 132 (L) 135 - 145 mmol/L   Potassium 4.4 3.5 - 5.1 mmol/L   Chloride 99 96 - 112 mEq/L   CO2 30 19 - 32 mmol/L   Glucose, Bld 122 (H) 70 - 99 mg/dL   BUN 35 (H) 6 - 23 mg/dL   Creatinine, Ser 1.54 (H) 0.50 - 1.35 mg/dL   Calcium 7.2 (L) 8.4 - 10.5 mg/dL   GFR calc non Af Amer 51 (L) >90  mL/min   GFR calc Af Amer 60 (L) >90 mL/min   Anion gap 3 (L) 5 - 15  Lactic acid, plasma     Status: None   Collection Time: 08/07/14  6:10 AM  Result Value Ref Range   Lactic Acid, Venous 1.0 0.5 - 2.2 mmol/L  Heparin level (unfractionated)     Status: None   Collection Time: 08/07/14  6:10 AM  Result Value Ref Range   Heparin Unfractionated 0.37 0.30 - 0.70 IU/mL    Studies/Results: No results found.    Assessment: Ischemic enteritis due to mesenteric venous thrombosis. He does not appear toxic.  Plan: Continue anticoagulation with heparin. Continue antibiotics. Continue to follow clinically. Watch for signs of decompensation that might suggest intestinal infarction(surgery is following also).    Albert Devaul F 08/07/2014, 11:50 AM

## 2014-08-07 NOTE — Progress Notes (Signed)
RN notified [facility] of the transfer to [place]. Report was called to [place].

## 2014-08-07 NOTE — Progress Notes (Signed)
Patient was transferred by [facility] to [facility]

## 2014-08-07 NOTE — Progress Notes (Signed)
Subjective: Abdomen more distended today per pt.  It's my first time to see him. No bowel sounds.  Says he's getting stuff that taste like dead goldfish.  No overt vomiting.  He is having allot of nausea, and says phenergan is helping with that.  He is just back from CT.  Objective: Vital signs in last 24 hours: Temp:  [98 F (36.7 C)-98.5 F (36.9 C)] 98.5 F (36.9 C) (01/04 0528) Pulse Rate:  [102-104] 102 (01/04 0528) Resp:  [16] 16 (01/04 0800) BP: (123-131)/(78-79) 123/79 mmHg (01/04 0528) SpO2:  [93 %-100 %] 100 % (01/04 0800) Last BM Date: 08/04/14 480 PO today recorded. Afebrile, VSS Creatinine is up still, but better. CT scan pending Intake/Output from previous day: 01/03 0701 - 01/04 0700 In: 2521.6 [I.V.:1821.6; IV Piggyback:700] Out: 2850 [Urine:2850] Intake/Output this shift: Total I/O In: 480 [P.O.:480] Out: 300 [Urine:300]  General appearance: alert, cooperative and no distress Resp: clear to auscultation bilaterally GI: Very large man with very significant distension.  No bowel sounds no flatus, no BM  Lab Results:   Recent Labs  08/06/14 0500 08/07/14 0610  WBC 10.6* 8.0  HGB 13.8 13.7  HCT 44.3 42.7  PLT 195 233    BMET  Recent Labs  08/06/14 0533 08/07/14 0610  NA 133* 132*  K 4.7 4.4  CL 99 99  CO2 28 30  GLUCOSE 126* 122*  BUN 52* 35*  CREATININE 2.00* 1.54*  CALCIUM 6.8* 7.2*   PT/INR  Recent Labs  08/05/14 1312  LABPROT 15.4*  INR 1.21     Recent Labs Lab 08/04/14 0412  AST 34  ALT 39  ALKPHOS 69  BILITOT 0.9  PROT 7.0  ALBUMIN 3.6     Lipase     Component Value Date/Time   LIPASE 59 08/04/2014 0412     Studies/Results: Mr Abdomen Wo Contrast  08/05/2014   CLINICAL DATA:  Abdominal pain nausea and cramping. eval for mesenteric vein thrombosis. Pt is 300lbs and extremely claus.  EXAM: MRI/ MRA ABDOMEN WITHOUT CONTRAST  TECHNIQUE: Multiplanar, multiecho pulse sequences of the abdomen were obtained WITHOUT  intravenous contrast. Angiographic images of abdomen were obtained using MRA technique WITHOUT intravenous contrast.Pre meds were given. Pt unable to stay awake for breath hold sequances. Was attemting to repeat axial ssfse lower in to the pelvis when pt became spastic and crawling out of the scanner. Unable to obtain any further imaging  : COMPARISON:  CT 08/04/2014 AND EARLIER STUDIES  FINDINGS: There is occlusive thrombus through the visualized superior mesenteric vein and main portal vein, terminating at the portal bifurcation. There may be small amount of nonocclusive thrombus extending into the left portal vein. Normal flow signal in the splenic vein.  Limited assessment of the abdominal aorta and proximal visceral and renal branches grossly unremarkable. IVC is patent.  There is a small amount perihepatic ,perisplenic, right lower quadrant mesenteric, and pelvic ascites. No focal liver lesion identified. 2.6 cm fluid signal partially exophytic probable cyst from the interpolar region left kidney. Right kidney, adrenal glands, pancreas, and nondilated gallbladder unremarkable.  IMPRESSION:  1. Occlusive thrombus in superior mesenteric vein extending through the main portal vein. 2. Abdominal ascites 3. Left renal cyst.   Electronically Signed   By: Arne Cleveland M.D.   On: 08/05/2014 15:34   Mr Jodene Nam Abdomen Wo Contrast  08/05/2014   CLINICAL DATA:  Abdominal pain nausea and cramping. eval for mesenteric vein thrombosis. Pt is 300lbs and extremely claus.  EXAM:  MRI/ MRA ABDOMEN WITHOUT CONTRAST  TECHNIQUE: Multiplanar, multiecho pulse sequences of the abdomen were obtained WITHOUT intravenous contrast. Angiographic images of abdomen were obtained using MRA technique WITHOUT intravenous contrast.Pre meds were given. Pt unable to stay awake for breath hold sequances. Was attemting to repeat axial ssfse lower in to the pelvis when pt became spastic and crawling out of the scanner. Unable to obtain any further  imaging  : COMPARISON:  CT 08/04/2014 AND EARLIER STUDIES  FINDINGS: There is occlusive thrombus through the visualized superior mesenteric vein and main portal vein, terminating at the portal bifurcation. There may be small amount of nonocclusive thrombus extending into the left portal vein. Normal flow signal in the splenic vein.  Limited assessment of the abdominal aorta and proximal visceral and renal branches grossly unremarkable. IVC is patent.  There is a small amount perihepatic ,perisplenic, right lower quadrant mesenteric, and pelvic ascites. No focal liver lesion identified. 2.6 cm fluid signal partially exophytic probable cyst from the interpolar region left kidney. Right kidney, adrenal glands, pancreas, and nondilated gallbladder unremarkable.  IMPRESSION:  1. Occlusive thrombus in superior mesenteric vein extending through the main portal vein. 2. Abdominal ascites 3. Left renal cyst.   Electronically Signed   By: Arne Cleveland M.D.   On: 08/05/2014 15:34    Medications: . allopurinol  300 mg Oral Daily  . aspirin  81 mg Oral Daily  . ciprofloxacin  400 mg Intravenous Q12H  . HYDROmorphone PCA 0.3 mg/mL   Intravenous 6 times per day  . metoprolol succinate  50 mg Oral Daily  . metronidazole  500 mg Intravenous Q8H  . omega-3 acid ethyl esters  1 g Oral Daily  . sodium chloride  3 mL Intravenous Q12H    Assessment/Plan 1. 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  Acute renal insufficiency Continued improvement 3.  Hx of CAD with PCI/NSTEMI 4.  Hx of CVA 5.  Body mass index is 37.55 kg/(m^2).    Plan:  He is waiting on some Tums, I will add PPI or H2.  I am concerned he will need an NG.  CT pending also.     LOS: 3 days    Breaunna Gottlieb 08/07/2014

## 2014-08-07 NOTE — Progress Notes (Signed)
BRIEF ANTICOAGULATION NOTE   Pharmacy Consult for heparin Indication: mesenteric vein thrombosis  Assessment: Please see full consult note from earlier today, 08/07/14.  Pharmacy is consulted to dose heparin for mesenteric thrombus.   HL 0.36 remains therapeutic on 2650 units/hr.  Pharmacist noted that IV pump was on this morning initially, but "turned itself off" per patient later this morning.  He reports that it was off for about 10 minutes from 10:00-10:10.  Rn restarted without complication.  Goal of Therapy:  Heparin level 0.3-0.7 units/ml Monitor platelets by anticoagulation protocol: Yes   Plan:   Continue heparin IV infusion at 2650 units/hr (26.5 ml/hr)  Daily heparin level and CBC  Pharmacy to f/u plan to transition to oral anticoagulation prior to discharge.  Thank you for the consult.  Gretta Arab PharmD, BCPS Pager [phone] 08/07/2014 1:03 PM

## 2014-08-07 NOTE — Progress Notes (Signed)
ANTICOAGULATION CONSULT NOTE - Follow Up Consult  Pharmacy Consult for heparin Indication: mesenteric vein thrombosis  Allergies  Allergen Reactions  . Ace Inhibitors Cough  . Lipitor [Atorvastatin] Other (See Comments)    myalgia  . Codeine Itching    Patient Measurements: Height: 6\' 2"  (188 cm) Weight: 292 lb 8.8 oz (132.7 kg) IBW/kg (Calculated) : 82.2 Heparin Dosing Weight: 112 kg  Assessment: Dylan [name] admitted 1/1 early AM with diffuse abdominal pain and diarrhea x 4 days PTA. Noted Hx CVA in 2011, CAD- s/p PCI in 2013, continued alcohol abuse, for tobacco use. Pt noted to be in ARF on admission.  He has not been on anticoagulation PTA.  Pharmacy is consulted to dose heparin for mesenteric thrombus.   Significant Events: 08/04/14  CT abdomen showed nonspecific small bowel loop wall thickening with surrounding fluid and accumulation 08/05/14 Renal US showed likely SMV thrombosis 08/07/14  Abdominal CT with contrast ordered.  Today, 08/07/2014:  HL 0.37 is therapeutic on 2650 units/hr.  CBC: Hgb 13.7, Plt 233.  Stable, WNL.  SCr improved to 1.54  RN reports no complications, infusion problems, or bleeding.  Pharmacist noted that IV pump was on this morning initially, but "turned itself off" per patient later this morning.  He reports that it was off for about 10 minutes from 10:00-10:10.  Rn restarted without complication.  Pt reports some blood on tissue when blowing nose, but denies extended nose bleed.  Drug-drug interactions:  concomitant aspirin 81mg  daily   Goal of Therapy:  Heparin level 0.3-0.7 units/ml Monitor platelets by anticoagulation protocol: Yes   Plan:   Continue heparin IV infusion at 2650 units/hr (26.5 ml/hr)  Heparin level 6 hours after therapeutic level to confirm.  Daily heparin level and CBC  Pharmacy to f/u plan to transition to oral anticoagulation prior to discharge.  Thank you for the consult.  Gretta Arab PharmD, BCPS Pager  [phone] 08/07/2014 7:37 AM

## 2014-08-07 NOTE — Progress Notes (Signed)
Patient stated that a doctor told him he did not need to have NG tube if he didn't want it, due to him having procedure tomorrow.  Attempted to educate patient regarding patient comfort and importance of bowel decompression, but patient still refused.  Order d/ced per hospitalist.  Will continue to monitor.

## 2014-08-07 NOTE — Progress Notes (Signed)
RN called report to Raynelle Bring RN on 2W at [facility].

## 2014-08-07 NOTE — Progress Notes (Signed)
[facility] PROGRESS NOTE  Dylan [name] RDE:[id number] DOB: [birth year] DOA: 08/04/2014 PCP: [name], MD Brief narrative 49 year old obese male with history of coronary artery disease status post angioplasty in 2013 presented with acute abdominal pain for the past 4 days associated with 4-5 episodes of diarrhea and 3 episodes of vomiting on the day of admission. No hematemesis, melena or rectal bleed.  found to have acute kidney injury as well. CT scan of the abdomen and pelvis without contrast in the ED showing  abnormally enlarged thick walled small bowel loops with surrounding inflammation and fluid in the right lower quadrant.  Patient admitted to medical floor with concern for infectious versus ischemic enteritis. Surgery consulted.   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 -Patient on Dilaudid PCA for pain control. Continue supportive care with aggressive IV hydration and antiemetics. -Started on IV heparin   for anticoagulation. We'll start Coumadin once abdominal pain and distention improved and no further surgical risk. Will need at least 6 months of anticoagulant. Hypercoagulable workup not sent and since patient already started on IV heparin and should have workup done once of anticoagulation as outpatient. -Appreciate surgery evaluation and follow-up. Continue empiric antibiotics. Stool for c diff ordered but since patient has not had any bowel movement or passing gas will discontinue precautions. --Nothing by mouth except for sips with meds -Serial abdominal exam. Repeat CT abd and pelvis ordered.  A.m. Labs including CBC, electrolytes and lactate normal . - Monitor closely for abdominal compartment syndrome. -Appreciate surgery and GI  evaluation.   Acute kidney injury Likely prerenal secondary to dehydration.  Renal ultrasound unremarkable except for old complex renal cyst. Hold Lasix, HCTZ and ARB. -Creatinine improving   CAD with history of PCI, and NSTEMI Seen by Dr Sallyanne Kuster in past. Has not seen him in almost a year. continue ASA and BB. Hold other meds    H/O: CVA (cerebrovascular accident), post cardiac cath, minimal speech residual 11/11 post PCI   Hyponatremia improved with fluids     Code Status: full code Family Communication: None at bedside today. Wife involved in care.  Disposition Plan: Inpatient   Consultants:  [place] [facility]  Procedures:  CT abdomen and pelvis without contrast  MRI/MRA of the abdomen without contrast  Ultrasound renal  Antibiotics:  IV Cipro and Flagyl since 1/1  HPI/Subjective: This is seen and examined. Abdominal distention unchanged. Pain controlled on Dilaudid PCA. Reports nausea. Still no BM or passing flatus.  Objective: Filed Vitals:   08/07/14 0800  BP:   Pulse:   Temp:   Resp: 16    Intake/Output Summary (Last 24 hours) at 08/07/14 1432 Last data filed at 08/07/14 0900  Gross per 24 hour  Intake 2701.6 ml  Output   2750 ml  Net  -48.4 ml   Filed Weights   08/04/14 1010  Weight: 132.7 kg (292 lb 8.8 oz)    Exam:   General:   in some distress with pain  HEENT:  moist oral mucosa  Chest: Clear to auscultation bilaterally  CVS: Normal S1 and S2, no murmurs  Abdomen: Distended, bowel sounds present.   tenderness on palpation to right mid quadrant, no guarding   Extremities: Warm, no edema  CNS: Alert and oriented   Data Reviewed: Basic Metabolic Panel:  Recent Labs Lab 08/04/14  0960 September 02, 2014 0519 08/06/14 0533 08/07/14 0610  NA 131* 138 133* 132*  K 4.0 4.7 4.7 4.4  CL 92* 98 99 99  CO2 28 32 28 30  GLUCOSE 136* 128* 126* 122*  BUN 45* 52* 52* 35*  CREATININE 2.77* 2.66* 2.00* 1.54*  CALCIUM 9.2 7.8* 6.8* 7.2*    Liver Function Tests:  Recent Labs Lab 08/04/14 0412  AST 34  ALT 39  ALKPHOS 69  BILITOT 0.9  PROT 7.0  ALBUMIN 3.6    Recent Labs Lab 08/04/14 0412  LIPASE 59   No results for input(s): AMMONIA in the last 168 hours. CBC:  Recent Labs Lab 08/04/14 0412 September 02, 2014 0519 08/06/14 0500 08/07/14 0610  WBC 11.6* 11.4* 10.6* 8.0  NEUTROABS 5.8  --   --   --   HGB 14.7 14.5 13.8 13.7  HCT 46.9 47.9 44.3 42.7  MCV 92.0 94.5 94.7 93.8  PLT 177 161 195 233   Cardiac Enzymes: No results for input(s): CKTOTAL, CKMB, CKMBINDEX, TROPONINI in the last 168 hours. BNP (last 3 results) No results for input(s): PROBNP in the last 8760 hours. CBG: No results for input(s): GLUCAP in the last 168 hours.  No results found for this or any previous visit (from the past 240 hour(s)).   Studies: Mr Abdomen Wo Contrast  September 02, 2014   CLINICAL DATA:  Abdominal pain nausea and cramping. eval for mesenteric vein thrombosis. Pt is 300lbs and extremely claus.  EXAM: MRI/ MRA ABDOMEN WITHOUT CONTRAST  TECHNIQUE: Multiplanar, multiecho pulse sequences of the abdomen were obtained WITHOUT intravenous contrast. Angiographic images of abdomen were obtained using MRA technique WITHOUT intravenous contrast.Pre meds were given. Pt unable to stay awake for breath hold sequances. Was attemting to repeat axial ssfse lower in to the pelvis when pt became spastic and crawling out of the scanner. Unable to obtain any further imaging  : COMPARISON:  CT 08/04/2014 AND EARLIER STUDIES  FINDINGS: There is occlusive thrombus through the visualized superior mesenteric vein and main portal vein, terminating at the portal bifurcation. There may be small amount of nonocclusive thrombus extending into the left portal vein. Normal flow signal in the splenic vein.  Limited assessment of the abdominal aorta and proximal visceral and renal branches grossly unremarkable. IVC is patent.  There is a small amount perihepatic  ,perisplenic, right lower quadrant mesenteric, and pelvic ascites. No focal liver lesion identified. 2.6 cm fluid signal partially exophytic probable cyst from the interpolar region left kidney. Right kidney, adrenal glands, pancreas, and nondilated gallbladder unremarkable.  IMPRESSION:  1. Occlusive thrombus in superior mesenteric vein extending through the main portal vein. 2. Abdominal ascites 3. Left renal cyst.   Electronically Signed   By: Arne Cleveland M.D.   On: 02-Sep-2014 15:34   Mr Jodene Nam Abdomen Wo Contrast  2014-09-02   CLINICAL DATA:  Abdominal pain nausea and cramping. eval for mesenteric vein thrombosis. Pt is 300lbs and extremely claus.  EXAM: MRI/ MRA ABDOMEN WITHOUT CONTRAST  TECHNIQUE: Multiplanar, multiecho pulse sequences of the abdomen were obtained WITHOUT intravenous contrast. Angiographic images of abdomen were obtained using MRA technique WITHOUT intravenous contrast.Pre meds were given. Pt unable to stay awake for breath hold sequances. Was attemting to repeat axial ssfse lower in to the pelvis when pt became spastic and crawling out of the scanner. Unable to obtain any further imaging  : COMPARISON:  CT 08/04/2014 AND EARLIER STUDIES  FINDINGS: There is occlusive thrombus through the visualized superior mesenteric vein and main portal  vein, terminating at the portal bifurcation. There may be small amount of nonocclusive thrombus extending into the left portal vein. Normal flow signal in the splenic vein.  Limited assessment of the abdominal aorta and proximal visceral and renal branches grossly unremarkable. IVC is patent.  There is a small amount perihepatic ,perisplenic, right lower quadrant mesenteric, and pelvic ascites. No focal liver lesion identified. 2.6 cm fluid signal partially exophytic probable cyst from the interpolar region left kidney. Right kidney, adrenal glands, pancreas, and nondilated gallbladder unremarkable.  IMPRESSION:  1. Occlusive thrombus in superior  mesenteric vein extending through the main portal vein. 2. Abdominal ascites 3. Left renal cyst.   Electronically Signed   By: Arne Cleveland M.D.   On: 08/05/2014 15:34    Scheduled Meds: . allopurinol  300 mg Oral Daily  . aspirin  81 mg Oral Daily  . ciprofloxacin  400 mg Intravenous Q12H  . HYDROmorphone PCA 0.3 mg/mL   Intravenous 6 times per day  . metoprolol succinate  50 mg Oral Daily  . metronidazole  500 mg Intravenous Q8H  . omega-3 acid ethyl esters  1 g Oral Daily  . sodium chloride  3 mL Intravenous Q12H   Continuous Infusions: . sodium chloride 125 mL/hr at 08/07/14 0650  . heparin 2,650 Units/hr (08/07/14 1207)     Time spent: 25 minutes    Lakeyta Vandenheuvel, [facility] Pager [phone] If 7PM-7AM, please contact night-coverage at [email or web address], password [facility] 08/07/2014, 2:32 PM  LOS: 3 days

## 2014-08-07 NOTE — Progress Notes (Signed)
Patient had a mild nosebleed.  Instructed patient to lean forward and pinch nose.  Nosebleed has stopped. Told patient to call RN if bleeding started again.  Will continue to monitor.

## 2014-08-07 NOTE — Progress Notes (Signed)
informed by radiologist Dr Kris Hartmann about patient's follow up abd CT. Appears to show increased ischemia of small bowel with increased proximal bowel distention and pelvic ascites.  D/w Dr surgery Hassell Done who recommended discussing with IR for possible thrombolysis. i spoke with Dr Jenetta Loges who recommends this is going to be an extremely complex procedure as the thrombus has to be approached through the spleen or  Portal vein. He will discuss with Dr Hassell Done directly.

## 2014-08-07 NOTE — Progress Notes (Signed)
As per Dr Earlie Server Discussion with IR Dr Barbie Banner., he will attempt thrombolysis of SMV tomorrow at [facility]. Pt will be transferred to [facility] on telemetry. Signed out to hospitalist at [facility].wife updated on the phone.

## 2014-08-08 ENCOUNTER — Inpatient Hospital Stay (HOSPITAL_COMMUNITY): Payer: BLUE CROSS/BLUE SHIELD

## 2014-08-08 DIAGNOSIS — R609 Edema, unspecified: Secondary | ICD-10-CM

## 2014-08-08 DIAGNOSIS — R079 Chest pain, unspecified: Secondary | ICD-10-CM

## 2014-08-08 LAB — CBC
HCT: 43.3 % (ref 39.0–52.0)
HEMOGLOBIN: 13.9 g/dL (ref 13.0–17.0)
MCH: 29.2 pg (ref 26.0–34.0)
MCHC: 32.1 g/dL (ref 30.0–36.0)
MCV: 91 fL (ref 78.0–100.0)
Platelets: 257 10*3/uL (ref 150–400)
RBC: 4.76 MIL/uL (ref 4.22–5.81)
RDW: 14.7 % (ref 11.5–15.5)
WBC: 7.3 10*3/uL (ref 4.0–10.5)

## 2014-08-08 LAB — COMPREHENSIVE METABOLIC PANEL
ALBUMIN: 2.3 g/dL — AB (ref 3.5–5.2)
ALK PHOS: 57 U/L (ref 39–117)
ALT: 71 U/L — ABNORMAL HIGH (ref 0–53)
ANION GAP: 7 (ref 5–15)
AST: 124 U/L — ABNORMAL HIGH (ref 0–37)
BUN: 24 mg/dL — AB (ref 6–23)
CALCIUM: 7.7 mg/dL — AB (ref 8.4–10.5)
CO2: 23 mmol/L (ref 19–32)
CREATININE: 1.42 mg/dL — AB (ref 0.50–1.35)
Chloride: 104 mEq/L (ref 96–112)
GFR calc Af Amer: 66 mL/min — ABNORMAL LOW (ref >90)
GFR calc non Af Amer: 57 mL/min — ABNORMAL LOW (ref >90)
GLUCOSE: 106 mg/dL — AB (ref 70–99)
Potassium: 4.1 mmol/L (ref 3.5–5.1)
Sodium: 134 mmol/L — ABNORMAL LOW (ref 135–145)
TOTAL PROTEIN: 5.2 g/dL — AB (ref 6.0–8.3)
Total Bilirubin: 0.6 mg/dL (ref 0.3–1.2)

## 2014-08-08 LAB — URINE MICROSCOPIC-ADD ON

## 2014-08-08 LAB — GLUCOSE, CAPILLARY: Glucose-Capillary: 86 mg/dL (ref 70–99)

## 2014-08-08 LAB — MRSA PCR SCREENING: MRSA by PCR: NEGATIVE

## 2014-08-08 LAB — HEPARIN LEVEL (UNFRACTIONATED)
HEPARIN UNFRACTIONATED: 0.16 [IU]/mL — AB (ref 0.30–0.70)
Heparin Unfractionated: 0.53 IU/mL (ref 0.30–0.70)

## 2014-08-08 LAB — URINALYSIS, ROUTINE W REFLEX MICROSCOPIC
BILIRUBIN URINE: NEGATIVE
Glucose, UA: NEGATIVE mg/dL
HGB URINE DIPSTICK: NEGATIVE
Ketones, ur: 15 mg/dL — AB
NITRITE: NEGATIVE
PROTEIN: NEGATIVE mg/dL
SPECIFIC GRAVITY, URINE: 1.023 (ref 1.005–1.030)
UROBILINOGEN UA: 0.2 mg/dL (ref 0.0–1.0)
pH: 5 (ref 5.0–8.0)

## 2014-08-08 LAB — CANCER ANTIGEN 19-9: CA 19-9: 15.1 U/mL — ABNORMAL LOW (ref <35.0)

## 2014-08-08 LAB — CEA: CEA: 0.6 ng/mL (ref 0.0–4.7)

## 2014-08-08 LAB — LACTIC ACID, PLASMA: Lactic Acid, Venous: 1.2 mmol/L (ref 0.5–2.2)

## 2014-08-08 MED ORDER — HEPARIN (PORCINE) IN NACL 100-0.45 UNIT/ML-% IJ SOLN
2300.0000 [IU]/h | INTRAMUSCULAR | Status: DC
  Administered 2014-08-09: 2600 [IU]/h via INTRAVENOUS
  Administered 2014-08-10: 2200 [IU]/h via INTRAVENOUS
  Administered 2014-08-10 – 2014-08-14 (×7): 2300 [IU]/h via INTRAVENOUS
  Filled 2014-08-08 (×21): qty 250

## 2014-08-08 MED ORDER — IOHEXOL 300 MG/ML  SOLN
150.0000 mL | Freq: Once | INTRAMUSCULAR | Status: AC | PRN
  Administered 2014-08-08: 50 mL via INTRAVENOUS

## 2014-08-08 MED ORDER — LIDOCAINE HCL 1 % IJ SOLN
INTRAMUSCULAR | Status: AC
  Filled 2014-08-08: qty 20

## 2014-08-08 MED ORDER — SODIUM CHLORIDE 0.9 % IV SOLN
0.5000 mg/h | Freq: Once | INTRAVENOUS | Status: AC
  Administered 2014-08-08: 0.5 mg/h via INTRAVENOUS
  Filled 2014-08-08: qty 24

## 2014-08-08 MED ORDER — MIDAZOLAM HCL 2 MG/2ML IJ SOLN
INTRAMUSCULAR | Status: AC | PRN
  Administered 2014-08-08 (×4): 1 mg via INTRAVENOUS

## 2014-08-08 MED ORDER — FENTANYL CITRATE 0.05 MG/ML IJ SOLN
INTRAMUSCULAR | Status: AC
  Filled 2014-08-08: qty 2

## 2014-08-08 MED ORDER — CEFAZOLIN SODIUM-DEXTROSE 2-3 GM-% IV SOLR
INTRAVENOUS | Status: AC
  Administered 2014-08-08: 2 g via INTRAVENOUS
  Filled 2014-08-08: qty 50

## 2014-08-08 MED ORDER — MIDAZOLAM HCL 2 MG/2ML IJ SOLN
INTRAMUSCULAR | Status: AC
  Filled 2014-08-08: qty 2

## 2014-08-08 MED ORDER — CEFAZOLIN SODIUM-DEXTROSE 2-3 GM-% IV SOLR
2.0000 g | INTRAVENOUS | Status: AC
Start: 2014-08-08 — End: 2014-08-08
  Administered 2014-08-08: 2 g via INTRAVENOUS

## 2014-08-08 MED ORDER — MIDAZOLAM HCL 2 MG/2ML IJ SOLN
INTRAMUSCULAR | Status: AC
  Filled 2014-08-08: qty 4

## 2014-08-08 MED ORDER — FENTANYL CITRATE 0.05 MG/ML IJ SOLN
INTRAMUSCULAR | Status: AC | PRN
  Administered 2014-08-08 (×3): 50 ug via INTRAVENOUS
  Administered 2014-08-08: 25 ug via INTRAVENOUS

## 2014-08-08 NOTE — Sedation Documentation (Signed)
Patient is resting comfortably. 

## 2014-08-08 NOTE — Consult Note (Signed)
Chief Complaint: Chief Complaint  Patient presents with  . Abdominal Pain    Referring Physician(s): Dunghel  History of Present Illness: Dylan [name] is a 49 y.o. male with PV and SMV thrombosis. He had pain on one week ago which has progressed in the RLQ associated with distention. He was admitted to [facility] and was found to have extensive DVT in the PV and SMV with bowel edema and threatened ischemia. He has been anticoagulated with minimal improvement. He is currently on IVF and making urine adequately. Pain is  5 out of 10 today. He did have a bowel movement this afternoon but still feels distended. Blood work yesterday shows a normal lactate level and Cr 1.5. He has no Hx of DVT. No Hx of GI bleeding or intracranial mass.  Past Medical History  Diagnosis Date  . Hypertension   . Coronary artery disease   . S/P angioplasty with stent (angiosculpt) of RCA for "in stent restenosis" 07/06/2012  . Hypercholesteremia   . Anginal pain   . NSTEMI (non-ST elevated myocardial infarction) 07/2010    r/t total occlusion of RCA (3 Taxus Ion DES)  . Stroke 2011    S/P cardiac cath - embolic stroke; denies residual (07/06/2012)  . Migraines     "maybe one/yr" (07/06/2012)  . 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 04/19/2013    No evidence of CVA on MRI/MRA & Dliated Eye Examination. ruptured blood vessel & not occlusion.    Past Surgical History  Procedure Laterality Date  . Septoplasty  2001  . Coronary angioplasty with stent placement  07/2010    inferior wall MI - 3 Taxus Ion DES (3.0x63mm, 3.0x74mm, 3.5x29mm) to prox and mid RCA  . Coronary angioplasty  07/06/2012    in-stent restenotic lesion in RCA 80%   . Cardiac catheterization  04/18/2013  . Left heart catheterization with coronary angiogram N/A 06/11/2012    Procedure: LEFT HEART CATHETERIZATION WITH CORONARY ANGIOGRAM;   Surgeon: [name], MD;  Location: [place] CATH LAB;  Service: Cardiovascular;  Laterality: N/A;  . Percutaneous coronary stent intervention (pci-s) N/A 07/06/2012    Procedure: PERCUTANEOUS CORONARY STENT INTERVENTION (PCI-S);  Surgeon: [name], MD;  Location: [facility] CATH LAB;  Service: Cardiovascular;  Laterality: N/A;  . Left heart catheterization with coronary angiogram N/A 04/18/2013    Procedure: LEFT HEART CATHETERIZATION WITH CORONARY ANGIOGRAM;  Surgeon: [name], MD;  Location: [facility] CATH LAB;  Service: Cardiovascular;  Laterality: N/A;    Allergies: Ace inhibitors; Lipitor; and Codeine  Medications: Prior to Admission medications   Medication Sig Start Date End Date Taking? Authorizing Provider  allopurinol (ZYLOPRIM) 300 MG tablet Take 1 tablet by mouth daily. 07/03/14  Yes Historical Provider, MD  aspirin 81 MG chewable tablet Chew 1 tablet (81 mg total) by mouth daily. 07/07/12  Yes Luke K Kilroy, PA-C  azithromycin (ZITHROMAX) 500 MG tablet See admin instructions. 3 tabs on day 1, 2 tabs daily on days 2 and 3 07/25/14  Yes Historical Provider, MD  cefdinir (OMNICEF) 300 MG capsule Take 1 capsule by mouth 2 (two) times daily. 07/29/14  Yes Historical Provider, MD  furosemide (LASIX) 20 MG tablet Take 20 mg by mouth daily.   Yes Historical Provider, MD  Liraglutide -Weight Management (SAXENDA) 18 MG/3ML SOPN Inject 3 mLs into the skin daily.   Yes Historical Provider, MD  metoprolol  succinate (TOPROL-XL) 50 MG 24 hr tablet Take 50 mg by mouth daily. Take with or immediately following a meal.   Yes Historical Provider, MD  olmesartan-hydrochlorothiazide (BENICAR HCT) 40-25 MG per tablet Take 1 tablet by mouth daily.   Yes Historical Provider, MD  omega-3 acid ethyl esters (LOVAZA) 1 G capsule Take 1 capsule (1 g total) by mouth daily. <please make appointment>   Yes Brittainy M Simmons, PA-C  predniSONE (STERAPRED UNI-PAK) 5 MG TABS tablet See admin instructions. 6 tabs on the  first day, decreasing by 1 tab daily until gone 07/29/14  Yes Historical Provider, MD  testosterone cypionate (DEPOTESTOTERONE CYPIONATE) 200 MG/ML injection Inject 1 mL into the muscle once a week. Wednesday 06/07/14  Yes Historical Provider, MD  acetaminophen (TYLENOL) 325 MG tablet Take 2 tablets (650 mg total) by mouth every 4 (four) hours as needed. 04/19/13   Erlene Quan, PA-C  fenofibrate 160 MG tablet Take 1 tablet (160 mg total) by mouth daily. Patient not taking: Reported on 08/04/2014 04/18/13   Brittainy M Rosita Fire, PA-C  metoprolol succinate (TOPROL-XL) 25 MG 24 hr tablet Take 1 tablet (25 mg total) by mouth daily. <please make appointment> Patient not taking: Reported on 08/04/2014    Erlene Quan, PA-C  nitroGLYCERIN (NITROSTAT) 0.4 MG SL tablet Place 1 tablet (0.4 mg total) under the tongue every 5 (five) minutes x 3 doses as needed for chest pain. 07/07/12   Erlene Quan, PA-C    Family History  Problem Relation Age of Onset  . Atrial fibrillation Mother   . Mitral valve prolapse Mother   . Coronary artery disease Father     CABG, aortic aneursym,    History   Social History  . Marital Status: Married    Spouse Name: N/A    Number of Children: 2  . Years of Education: 12   Occupational History  .  [employer]   Social History Main Topics  . Smoking status: Former Smoker -- 1.00 packs/day for 15 years    Types: Cigarettes    Quit date: 06/30/2010  . Smokeless tobacco: Former Systems developer    Types: [place] date: 06/30/2010  . Alcohol Use: 16.8 oz/week    28 Shots of liquor per week     Comment: 04/18/2013 "2 drinks/ day w/maybe 2 shots in each drink"  . Drug Use: No  . Sexual Activity: Yes   Other Topics Concern  . None   Social History Narrative      Review of Systems: A 12 point ROS discussed and pertinent positives are indicated in the HPI above.  All other systems are negative.  Review of Systems  Vital Signs: BP 140/93 mmHg  Pulse 90  Temp(Src) 97.7 F  (36.5 C) (Oral)  Resp 23  Ht 6\' 2"  (1.88 m)  Wt 307 lb 12.2 oz (139.6 kg)  BMI 39.50 kg/m2  SpO2 94%  Physical Exam  Constitutional: He is oriented to person, place, and time. He appears well-developed and well-nourished.  Pulmonary/Chest: Effort normal.  Abdominal: Soft. He exhibits distension. There is no tenderness.  Neurological: He is alert and oriented to person, place, and time.    Imaging: Ct Abdomen Pelvis Wo Contrast  08/07/2014   CLINICAL DATA:  Generalized abdominal pain and cramping. Nausea and vomiting. Superior mesenteric vein thrombosis.  EXAM: CT ABDOMEN AND PELVIS WITHOUT CONTRAST  TECHNIQUE: Multidetector CT imaging of the abdomen and pelvis was performed following the standard protocol without IV contrast.  COMPARISON:  CT on 08/04/2014 and MRI on 08/05/2014  FINDINGS: Lower chest:  New small right pleural effusion.  Hepatobiliary:  No mass visualized on this non-contrast exam.  Pancreas: No mass or inflammatory process visualized on this non-contrast exam.  Spleen:  Within normal limits in size.  Adrenal Glands:  No mass identified.  Kidneys/Urinary tract: No evidence of urolithiasis or hydronephrosis. 3 cm complex cyst with mild mural calcifications in the posterior upper pole the left kidney is stable.  Stomach/Bowel/Peritoneum: Mild increase and mesenteric edema and mild ascites is seen since previous study. Persistent wall thickening is seen involving multiple small bowel loops in the right abdomen. In the setting of superior mesenteric vein thrombosis, this is suspicious for bowel ischemia. Increased dilatation of proximal small bowel is also seen. No evidence of pneumatosis or free air.  Vascular/Lymphatic: No pathologically enlarged lymph nodes identified. Superior mesenteric vein remains distended, consistent with venous thrombosis as demonstrated on recent MRI.  Reproductive:  No mass or other significant abnormality noted.  Other:  None.  Musculoskeletal:  No suspicious  bone lesions identified.  IMPRESSION: Persistent small bowel wall thickening in the right abdomen, suspicious for bowel ischemia in the setting of SMV thrombosis which was demonstrated on recent MRI.  Increased proximal small bowel dilatation, suspicious for small bowel obstruction.  Increase in diffuse mesenteric edema and ascites.  Critical Value/emergent results were called by telephone at the time of interpretation on 08/07/2014 at 2:37 pm to Dr. Louellen Molder , who verbally acknowledged these results.   Electronically Signed   By: Earle Gell M.D.   On: 08/07/2014 14:37   Ct Abdomen Pelvis Wo Contrast  08/04/2014   CLINICAL DATA:  Generalized abdomen pain, cramps, nausea, vomiting.  EXAM: CT ABDOMEN AND PELVIS WITHOUT CONTRAST  TECHNIQUE: Multidetector CT imaging of the abdomen and pelvis was performed following the standard protocol without IV contrast. There is oral contrast in the stomach only.  COMPARISON:  July 06, 2002  FINDINGS: The liver, spleen, pancreas, gallbladder, adrenal glands and right kidney are normal. There is no hydronephrosis bilaterally. There is a 2.3 x 3 cm slight low-density lesion in the posterior midpole left kidney with minimal rim calcification ; on the previous CT, a simple cysts was present in the same location. There is atherosclerosis of the abdominal aorta without aneurysmal dilatation. There is no abdominal lymphadenopathy.  There are several loops of abnormal enlarged thick walled small bowel loops with surrounding inflammation and fluid in the right lower quadrant. There is no free air. The appendix is not definitely seen. The colon is normal.  Fluid-filled bladder is normal. Small amount of free fluid is identified in the pelvis. The lung bases are clear. Degenerative joint changes of the spine are noted.  IMPRESSION: Several loops of abnormal enlarged thick walled small bowel loops with surrounding inflammation and fluid in the right lower quadrant. The findings are  nonspecific but can be due to infectious or inflammatory etiology.   Electronically Signed   By: Abelardo Diesel M.D.   On: 08/04/2014 07:37   Mr Abdomen Wo Contrast  08/05/2014   CLINICAL DATA:  Abdominal pain nausea and cramping. eval for mesenteric vein thrombosis. Pt is 300lbs and extremely claus.  EXAM: MRI/ MRA ABDOMEN WITHOUT CONTRAST  TECHNIQUE: Multiplanar, multiecho pulse sequences of the abdomen were obtained WITHOUT intravenous contrast. Angiographic images of abdomen were obtained using MRA technique WITHOUT intravenous contrast.Pre meds were given. Pt unable to stay awake for breath hold sequances. Was attemting  to repeat axial ssfse lower in to the pelvis when pt became spastic and crawling out of the scanner. Unable to obtain any further imaging  : COMPARISON:  CT 08/04/2014 AND EARLIER STUDIES  FINDINGS: There is occlusive thrombus through the visualized superior mesenteric vein and main portal vein, terminating at the portal bifurcation. There may be small amount of nonocclusive thrombus extending into the left portal vein. Normal flow signal in the splenic vein.  Limited assessment of the abdominal aorta and proximal visceral and renal branches grossly unremarkable. IVC is patent.  There is a small amount perihepatic ,perisplenic, right lower quadrant mesenteric, and pelvic ascites. No focal liver lesion identified. 2.6 cm fluid signal partially exophytic probable cyst from the interpolar region left kidney. Right kidney, adrenal glands, pancreas, and nondilated gallbladder unremarkable.  IMPRESSION:  1. Occlusive thrombus in superior mesenteric vein extending through the main portal vein. 2. Abdominal ascites 3. Left renal cyst.   Electronically Signed   By: Arne Cleveland M.D.   On: 08/05/2014 15:34   Mr Jodene Nam Abdomen Wo Contrast  08/05/2014   CLINICAL DATA:  Abdominal pain nausea and cramping. eval for mesenteric vein thrombosis. Pt is 300lbs and extremely claus.  EXAM: MRI/ MRA ABDOMEN  WITHOUT CONTRAST  TECHNIQUE: Multiplanar, multiecho pulse sequences of the abdomen were obtained WITHOUT intravenous contrast. Angiographic images of abdomen were obtained using MRA technique WITHOUT intravenous contrast.Pre meds were given. Pt unable to stay awake for breath hold sequances. Was attemting to repeat axial ssfse lower in to the pelvis when pt became spastic and crawling out of the scanner. Unable to obtain any further imaging  : COMPARISON:  CT 08/04/2014 AND EARLIER STUDIES  FINDINGS: There is occlusive thrombus through the visualized superior mesenteric vein and main portal vein, terminating at the portal bifurcation. There may be small amount of nonocclusive thrombus extending into the left portal vein. Normal flow signal in the splenic vein.  Limited assessment of the abdominal aorta and proximal visceral and renal branches grossly unremarkable. IVC is patent.  There is a small amount perihepatic ,perisplenic, right lower quadrant mesenteric, and pelvic ascites. No focal liver lesion identified. 2.6 cm fluid signal partially exophytic probable cyst from the interpolar region left kidney. Right kidney, adrenal glands, pancreas, and nondilated gallbladder unremarkable.  IMPRESSION:  1. Occlusive thrombus in superior mesenteric vein extending through the main portal vein. 2. Abdominal ascites 3. Left renal cyst.   Electronically Signed   By: Arne Cleveland M.D.   On: 08/05/2014 15:34   US Renal Port  08/05/2014   CLINICAL DATA:  Acute renal injury.  Hypertension.  EXAM: RENAL/URINARY TRACT ULTRASOUND COMPLETE  COMPARISON:  08/04/2014  FINDINGS: Right Kidney:  Length: 13.0 cm. Echogenicity within normal limits. No mass or hydronephrosis visualized.  Left Kidney:  Length: 12.3 cm. Echogenicity within normal limits. No hydronephrosis. 2.2 by 1.7 cm complex exophytic lesion of the left mid kidney, as shown on CT scan.  Bladder:  Appears normal for degree of bladder distention.  Other: Small amount of  ascites adjacent to the right hepatic lobe. 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  IMPRESSION: 1. 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 This could be confirmed with noncontrast 3D time-of-flight MRI of the mesenteric vasculature, if clinically warranted. 2. Small amount of ascites adjacent to  the right hepatic lobe. 3. The 2.2 by 1.7 cm exophytic lesion of the left mid kidney is confirmed to be complex by ultrasound. I do note that there was a reasonably benign appearing cyst in this location on 07/06/2002. Although likely benign, the lesion is not completely specific on today's exam. These results were called by telephone at the time of interpretation on 08/05/2014 at 12:42 pm to Dr. Louellen Molder , who verbally acknowledged these results.   Electronically Signed   By: Sherryl Barters M.D.   On: 08/05/2014 12:46   Dg Abd Acute W/chest  08/04/2014   CLINICAL DATA:  Abdominal pain  EXAM: ACUTE ABDOMEN SERIES (ABDOMEN 2 VIEW & CHEST 1 VIEW)  COMPARISON:  04/17/2013 chest x-ray  FINDINGS: The abdomen is essentially gasless, which limits the utility of radiography. Few bubbles noted in the right lower quadrant. There is no concerning intra-abdominal mass effect or calcification. No pneumoperitoneum. Previous lower abdominal wall mesh repair. Normal heart size and mediastinal contours. No acute infiltrate or edema. No effusion or pneumothorax. No acute osseous findings.  IMPRESSION: 1. There is no definitive obstruction, but this technique is limited by relatively gasless abdomen. Fluid dilated loops of bowel would be missed on this study. 2. Negative chest.   Electronically Signed   By: Jorje Guild M.D.   On: 08/04/2014 06:29   Dg Abd Portable 1v  08/05/2014   CLINICAL DATA:  Subsequent  evaluation for right-sided abdominal pain with nausea vomiting and diarrhea  EXAM: PORTABLE ABDOMEN - 1 VIEW  COMPARISON:  08/04/2014  FINDINGS: Mild gaseous distention of the stomach. Minimal gas elsewhere in the abdomen. There is a small amount of oral contrast appreciated throughout the colon.  IMPRESSION: Nonspecific gas pattern.   Electronically Signed   By: Skipper Cliche M.D.   On: 08/05/2014 11:21    Labs:  CBC:  Recent Labs  08/04/14 0412 08/05/14 0519 08/06/14 0500 08/07/14 0610  WBC 11.6* 11.4* 10.6* 8.0  HGB 14.7 14.5 13.8 13.7  HCT 46.9 47.9 44.3 42.7  PLT 177 161 195 233    COAGS:  Recent Labs  08/05/14 1312  INR 1.21  APTT 28    BMP:  Recent Labs  08/04/14 0412 08/05/14 0519 08/06/14 0533 08/07/14 0610  NA 131* 138 133* 132*  K 4.0 4.7 4.7 4.4  CL 92* 98 99 99  CO2 28 32 28 30  GLUCOSE 136* 128* 126* 122*  BUN 45* 52* 52* 35*  CALCIUM 9.2 7.8* 6.8* 7.2*  CREATININE 2.77* 2.66* 2.00* 1.54*  GFRNONAA 25* 27* 37* 51*  GFRAA 29* 31* 43* 60*    LIVER FUNCTION TESTS:  Recent Labs  08/04/14 0412  BILITOT 0.9  AST 34  ALT 39  ALKPHOS 69  PROT 7.0  ALBUMIN 3.6    TUMOR MARKERS:  Recent Labs  08/07/14 0610  CEA 0.6  CA199 15.1*    Assessment and Plan:  49 YO with extensive PV/SMV DVT and threatened bowel ischemia. He has had minimal improvement with heparin therefore thrombolysis is indicated. Systemic lytics carries systemic bleeding risk. This would be minimized with local lytics. Transhepatic approach does carry local bleeding and some systemic bleeding risk. I will review today's blood work and proceed cautiously.      Signed: Calub Tarnow, ART A 08/08/2014, 1:30 PM

## 2014-08-08 NOTE — Progress Notes (Signed)
Utilization review completed.  

## 2014-08-08 NOTE — Sedation Documentation (Signed)
Portal Vien infusion catheter place and TPA infusion will be started soon.

## 2014-08-08 NOTE — Procedures (Signed)
Proc: Transhepatic SMV/PV lysis Find: Extensive DVT from small ileacolic branches into the MPV Sheath: Transhepatic approach 6 Fr 35 cm R PV to MPV Inf: 90 cm 20 cm 5 Fr. TPA at 0.5 mg per hr Comp: None.

## 2014-08-08 NOTE — Progress Notes (Signed)
eLink Physician-Brief Progress Note Patient Name: Dylan [name] DOB: 1966/01/02 MRN: [id number]   Date of Service  08/08/2014  HPI/Events of Note  desatn during supine sleep Loud snoring  eICU Interventions  Likely has OSA CPAP q hs Consider sleep study/ evaluation as outpt once acute issues resolved -can call [phone] & schedule appt on discharge     Intervention Category Intermediate Interventions: Communication with other healthcare providers and/or family  ALVA,RAKESH V. 08/08/2014, 11:40 PM

## 2014-08-08 NOTE — Progress Notes (Signed)
Patient ID: Dylan [name], male   DOB: 05-10-1966, 49 y.o.   MRN: [id number]    Subjective: Pt having 7/10 pain, but this is stable.  No better or worse.  Tried NGT placement yesterday, but were unable to place.  No vomiting, but no flatus or stool.   Objective: Vital signs in last 24 hours: Temp:  [97.7 F (36.5 C)-99.5 F (37.5 C)] 97.7 F (36.5 C) (01/05 0657) Pulse Rate:  [90-102] 90 (01/05 0657) Resp:  [18-21] 18 (01/05 0657) BP: (135-144)/(84-94) 140/93 mmHg (01/05 0657) SpO2:  [94 %-99 %] 94 % (01/05 0657) Weight:  [307 lb 12.2 oz (139.6 kg)] 307 lb 12.2 oz (139.6 kg) (01/05 0013) Last BM Date: 08/04/14  Intake/Output from previous day: 01/04 0701 - 01/05 0700 In: [place] [P.O.:600; I.V.:1020; IV Piggyback:100] Out: 1550 [Urine:1550] Intake/Output this shift:    PE: Abd: distended, tender in RLQ, hypoactive BS Heart: regular  Lab Results:   Recent Labs  08/06/14 0500 08/07/14 0610  WBC 10.6* 8.0  HGB 13.8 13.7  HCT 44.3 42.7  PLT 195 233   BMET  Recent Labs  08/06/14 0533 08/07/14 0610  NA 133* 132*  K 4.7 4.4  CL 99 99  CO2 28 30  GLUCOSE 126* 122*  BUN 52* 35*  CREATININE 2.00* 1.54*  CALCIUM 6.8* 7.2*   PT/INR  Recent Labs  08/05/14 1312  LABPROT 15.4*  INR 1.21   CMP     Component Value Date/Time   NA 132* 08/07/2014 0610   K 4.4 08/07/2014 0610   CL 99 08/07/2014 0610   CO2 30 08/07/2014 0610   GLUCOSE 122* 08/07/2014 0610   BUN 35* 08/07/2014 0610   CREATININE 1.54* 08/07/2014 0610   CALCIUM 7.2* 08/07/2014 0610   PROT 7.0 08/04/2014 0412   ALBUMIN 3.6 08/04/2014 0412   AST 34 08/04/2014 0412   ALT 39 08/04/2014 0412   ALKPHOS 69 08/04/2014 0412   BILITOT 0.9 08/04/2014 0412   GFRNONAA 51* 08/07/2014 0610   GFRAA 60* 08/07/2014 0610   Lipase     Component Value Date/Time   LIPASE 59 08/04/2014 0412       Studies/Results: Ct Abdomen Pelvis Wo Contrast  08/07/2014   CLINICAL DATA:  Generalized abdominal pain and  cramping. Nausea and vomiting. Superior mesenteric vein thrombosis.  EXAM: CT ABDOMEN AND PELVIS WITHOUT CONTRAST  TECHNIQUE: Multidetector CT imaging of the abdomen and pelvis was performed following the standard protocol without IV contrast.  COMPARISON:  CT on 08/04/2014 and MRI on 08/05/2014  FINDINGS: Lower chest:  New small right pleural effusion.  Hepatobiliary:  No mass visualized on this non-contrast exam.  Pancreas: No mass or inflammatory process visualized on this non-contrast exam.  Spleen:  Within normal limits in size.  Adrenal Glands:  No mass identified.  Kidneys/Urinary tract: No evidence of urolithiasis or hydronephrosis. 3 cm complex cyst with mild mural calcifications in the posterior upper pole the left kidney is stable.  Stomach/Bowel/Peritoneum: Mild increase and mesenteric edema and mild ascites is seen since previous study. Persistent wall thickening is seen involving multiple small bowel loops in the right abdomen. In the setting of superior mesenteric vein thrombosis, this is suspicious for bowel ischemia. Increased dilatation of proximal small bowel is also seen. No evidence of pneumatosis or free air.  Vascular/Lymphatic: No pathologically enlarged lymph nodes identified. Superior mesenteric vein remains distended, consistent with venous thrombosis as demonstrated on recent MRI.  Reproductive:  No mass or other significant abnormality noted.  Other:  None.  Musculoskeletal:  No suspicious bone lesions identified.  IMPRESSION: Persistent small bowel wall thickening in the right abdomen, suspicious for bowel ischemia in the setting of SMV thrombosis which was demonstrated on recent MRI.  Increased proximal small bowel dilatation, suspicious for small bowel obstruction.  Increase in diffuse mesenteric edema and ascites.  Critical Value/emergent results were called by telephone at the time of interpretation on 08/07/2014 at 2:37 pm to Dr. Louellen Molder , who verbally acknowledged these  results.   Electronically Signed   By: Earle Gell M.D.   On: 08/07/2014 14:37    Anti-infectives: Anti-infectives    Start     Dose/Rate Route Frequency Ordered Stop   08/04/14 0845  ciprofloxacin (CIPRO) IVPB 400 mg     400 mg200 mL/hr over 60 Minutes Intravenous Every 12 hours 08/04/14 0834     08/04/14 0845  metroNIDAZOLE (FLAGYL) IVPB 500 mg     500 mg100 mL/hr over 60 Minutes Intravenous Every 8 hours 08/04/14 0834         Assessment/Plan  1. SMV thrombus  2. SBO secondary to enteritis from #1 3. AKI, improving  Plan: 1. IR to perform thrombolysis today of clot.  Hopefully this will restore some bloody flow to the small bowel and eventually will reverse his obstruction.   2. Cont heparin per primary service 3. The patient does not appear ill or have peritoneal signs to indicated he has frankly necrotic bowel.  If this were to change and become a concern, then he would likely require surgical intervention.  We will follow along.  LOS: 4 days    Dylan [name] E 08/08/2014, 8:32 AM Pager: [phone]

## 2014-08-08 NOTE — Progress Notes (Addendum)
ANTICOAGULATION CONSULT NOTE - Follow Up Consult  Pharmacy Consult for heparin Indication: mesenteric vein thrombosis  Allergies  Allergen Reactions  . Ace Inhibitors Cough  . Lipitor [Atorvastatin] Other (See Comments)    myalgia  . Codeine Itching    Patient Measurements: Height: 6\' 2"  (188 cm) Weight: (!) 307 lb 12.2 oz (139.6 kg) IBW/kg (Calculated) : 82.2 Heparin Dosing Weight:   Vital Signs: Temp: 99 F (37.2 C) (01/05 1956) Temp Source: Oral (01/05 1956) BP: 153/110 mmHg (01/05 2000) Pulse Rate: 91 (01/05 2000)  Labs:  Recent Labs  08/06/14 0500 08/06/14 0533  08/07/14 0610 08/07/14 1230 08/08/14 1005 08/08/14 1338 08/08/14 2110  HGB 13.8  --   --  13.7  --   --  13.9  --   HCT 44.3  --   --  42.7  --   --  43.3  --   PLT 195  --   --  233  --   --  257  --   HEPARINUNFRC 0.14*  --   < > 0.37 0.36 0.16*  --  0.53  CREATININE  --  2.00*  --  1.54*  --   --  1.42*  --   < > = values in this interval not displayed.  Estimated Creatinine Clearance: 93.6 mL/min (by C-G formula based on Cr of 1.42).   Medications:  Scheduled:  . allopurinol  300 mg Oral Daily  . alteplase (tPA / ACTIVASE) PE Lysis 24 mg/500 mL (UNILATERAL)  0.5 mg/hr Intravenous Once  . aspirin  81 mg Oral Daily  . ceFAZolin      .  ceFAZolin (ANCEF) IV  2 g Intravenous 60 min Pre-Op  . ciprofloxacin  400 mg Intravenous Q12H  . famotidine (PEPCID) IV  20 mg Intravenous Q12H  . fentaNYL      . fentaNYL      . HYDROmorphone PCA 0.3 mg/mL   Intravenous 6 times per day  . lidocaine      . metoprolol succinate  50 mg Oral Daily  . metronidazole  500 mg Intravenous Q8H  . midazolam      . omega-3 acid ethyl esters  1 g Oral Daily  . sodium chloride  3 mL Intravenous Q12H    Assessment: 49 yr old male with mesenteric thrombosis on IV heparin. Heparin level is within desired range with a level of 0.53 on 2850 units/hr. No bleeding. Pt receiving TPA.  Goal of Therapy:  Heparin level  0.3-0.7 units/ml Monitor platelets by anticoagulation protocol: Yes   Plan:  Continue heparin at same rate. F/U labs in the AM.   Minta Balsam 08/08/2014,10:08 PM   Addendum: The TPA is not infusing. The heparin level goal will be changed to 0.3-0.5. The rate of the heparin will be decreased to 2700 units/hr.  Arrie Senate, PharmD

## 2014-08-08 NOTE — Progress Notes (Signed)
Bilateral lower extremity venous duplex completed:  No evidence of DVT, superficial thrombosis, or Baker's cyst.   

## 2014-08-08 NOTE — Progress Notes (Signed)
[facility] PROGRESS NOTE  ANTINO MAYABB YPP:[id number] DOB: Mar 10, 1966 DOA: 08/04/2014 PCP: [name], MD  Assessment/Plan: 49 y/o male with PMH of HTN, HPL, CAD s/p PCI/stent (4580), complicated with CVA, presented with nausea, vomiting, diarrhea and found to have small bowel enteritis, ischemia, and SMV thrombosis    1. SBO, Small bowel enteritis, ischemia with SMV/PV thrombosis  -NPO, IVF, atx, antiemetics, pain control; no vomiting, but no flatus >3 days; at risk for bowel infarction; GI, surgery following  2. SMV thrombosis; MRA abd: occlusive thrombus in SMV extending through the main portal vein  -on IV heparin, called IR; awaiting IR for possible thrombolysis eval (per radiology discussion, Dr. Barbie Banner to eval the patient ); will need heparin/coumaidn bridging  3. AKI likely ATN in the setting of SBO; renal function improving; cont IVF, check UA 4. BL leg edema of unclear etiology; echo (2014): LVEF 55-60%, LVH, wall motion was normal;  -check Leg [place] r/o DVT; hold diuretics with SBO/IVF;  5. CAD s/p PCI/stent (2013) denies acute chest pains; cont BB, ASA; monitor  6. H/o CVA post cath with minimal residual speech;    Code Status: full Family Communication:  D/w patient; no family at the bedside  (indicate person spoken with, relationship, and if by phone, the number) Disposition Plan: pend clinical improvement    Consultants:  Surgery  GI  IR  Procedures:  Pend IR  Antibiotics:  cipro/ flagyl 1/1>>>> (indicate start date, and stop date if known)  HPI/Subjective: Alert, oriented; no distress   Objective: Filed Vitals:   08/08/14 0657  BP: 140/93  Pulse: 90  Temp: 97.7 F (36.5 C)  Resp: 18    Intake/Output Summary (Last 24 hours) at 08/08/14 0810 Last data filed at 08/07/14 1742  Gross per 24 hour  Intake   1720 ml  Output   1550 ml  Net    170 ml   Filed Weights   08/04/14 1010 08/08/14 0013  Weight: 132.7 kg (292 lb 8.8 oz) 139.6  kg (307 lb 12.2 oz)    Exam:   General:  alert  Cardiovascular: s1,s2 rrr  Respiratory: few crackels in RLL  Abdomen: distended, mild tender, no bs   Musculoskeletal: BL leg edema mild    Data Reviewed: Basic Metabolic Panel:  Recent Labs Lab 08/04/14 0412 08/05/14 0519 08/06/14 0533 08/07/14 0610  NA 131* 138 133* 132*  K 4.0 4.7 4.7 4.4  CL 92* 98 99 99  CO2 28 32 28 30  GLUCOSE 136* 128* 126* 122*  BUN 45* 52* 52* 35*  CREATININE 2.77* 2.66* 2.00* 1.54*  CALCIUM 9.2 7.8* 6.8* 7.2*   Liver Function Tests:  Recent Labs Lab 08/04/14 0412  AST 34  ALT 39  ALKPHOS 69  BILITOT 0.9  PROT 7.0  ALBUMIN 3.6    Recent Labs Lab 08/04/14 0412  LIPASE 59   No results for input(s): AMMONIA in the last 168 hours. CBC:  Recent Labs Lab 08/04/14 0412 08/05/14 0519 08/06/14 0500 08/07/14 0610  WBC 11.6* 11.4* 10.6* 8.0  NEUTROABS 5.8  --   --   --   HGB 14.7 14.5 13.8 13.7  HCT 46.9 47.9 44.3 42.7  MCV 92.0 94.5 94.7 93.8  PLT 177 161 195 233   Cardiac Enzymes: No results for input(s): CKTOTAL, CKMB, CKMBINDEX, TROPONINI in the last 168 hours. BNP (last 3 results) No results for input(s): PROBNP in the last 8760 hours. CBG: No results for input(s): GLUCAP in the last  168 hours.  No results found for this or any previous visit (from the past 240 hour(s)).   Studies: Ct Abdomen Pelvis Wo Contrast  08/07/2014   CLINICAL DATA:  Generalized abdominal pain and cramping. Nausea and vomiting. Superior mesenteric vein thrombosis.  EXAM: CT ABDOMEN AND PELVIS WITHOUT CONTRAST  TECHNIQUE: Multidetector CT imaging of the abdomen and pelvis was performed following the standard protocol without IV contrast.  COMPARISON:  CT on 08/04/2014 and MRI on 08/05/2014  FINDINGS: Lower chest:  New small right pleural effusion.  Hepatobiliary:  No mass visualized on this non-contrast exam.  Pancreas: No mass or inflammatory process visualized on this non-contrast exam.  Spleen:   Within normal limits in size.  Adrenal Glands:  No mass identified.  Kidneys/Urinary tract: No evidence of urolithiasis or hydronephrosis. 3 cm complex cyst with mild mural calcifications in the posterior upper pole the left kidney is stable.  Stomach/Bowel/Peritoneum: Mild increase and mesenteric edema and mild ascites is seen since previous study. Persistent wall thickening is seen involving multiple small bowel loops in the right abdomen. In the setting of superior mesenteric vein thrombosis, this is suspicious for bowel ischemia. Increased dilatation of proximal small bowel is also seen. No evidence of pneumatosis or free air.  Vascular/Lymphatic: No pathologically enlarged lymph nodes identified. Superior mesenteric vein remains distended, consistent with venous thrombosis as demonstrated on recent MRI.  Reproductive:  No mass or other significant abnormality noted.  Other:  None.  Musculoskeletal:  No suspicious bone lesions identified.  IMPRESSION: Persistent small bowel wall thickening in the right abdomen, suspicious for bowel ischemia in the setting of SMV thrombosis which was demonstrated on recent MRI.  Increased proximal small bowel dilatation, suspicious for small bowel obstruction.  Increase in diffuse mesenteric edema and ascites.  Critical Value/emergent results were called by telephone at the time of interpretation on 08/07/2014 at 2:37 pm to Dr. Louellen Molder , who verbally acknowledged these results.   Electronically Signed   By: Earle Gell M.D.   On: 08/07/2014 14:37    Scheduled Meds: . allopurinol  300 mg Oral Daily  . aspirin  81 mg Oral Daily  . ciprofloxacin  400 mg Intravenous Q12H  . famotidine (PEPCID) IV  20 mg Intravenous Q12H  . HYDROmorphone PCA 0.3 mg/mL   Intravenous 6 times per day  . metoprolol succinate  50 mg Oral Daily  . metronidazole  500 mg Intravenous Q8H  . omega-3 acid ethyl esters  1 g Oral Daily  . sodium chloride  3 mL Intravenous Q12H   Continuous  Infusions: . sodium chloride 125 mL/hr at 08/07/14 2312  . heparin 2,650 Units/hr (08/07/14 2311)    Principal Problem:   Superior mesenteric vein thrombosis Active Problems:   CAD, RCA X 3 ION DES 07/01/2010, ISR -06/23/12    Hx of non-ST elevation myocardial infarction (NSTEMI), 2011   H/O: CVA (cerebrovascular accident), post cardiac cath, minimal speech residual 11/11 post PCI   Acute abdomen   Acute kidney injury   Hyponatremia   Portal vein thrombosis    Time spent: >35 minutes     Kinnie Feil  [facility] Pager [phone]. If 7PM-7AM, please contact night-coverage at [email or web address], password [facility] 08/08/2014, 8:10 AM  LOS: 4 days

## 2014-08-08 NOTE — Progress Notes (Signed)
[facility] Progress Note  Subjective: Patient's pain about same abdomen distended, still no stool  Objective: Vital signs in last 24 hours: Temp:  [97.7 F (36.5 C)-99.5 F (37.5 C)] 97.7 F (36.5 C) (01/05 0657) Pulse Rate:  [90-102] 90 (01/05 0657) Resp:  [18-21] 18 (01/05 0800) BP: (135-144)/(84-94) 140/93 mmHg (01/05 0657) SpO2:  [94 %-99 %] 94 % (01/05 0800) Weight:  [139.6 kg (307 lb 12.2 oz)] 139.6 kg (307 lb 12.2 oz) (01/05 0013) Weight change:    PE: Unchanged  Lab Results: Results for orders placed or performed during the hospital encounter of 08/04/14 (from the past 24 hour(s))  Heparin level (unfractionated)     Status: None   Collection Time: 08/07/14 12:30 PM  Result Value Ref Range   Heparin Unfractionated 0.36 0.30 - 0.70 IU/mL    Studies/Results: Ct Abdomen Pelvis Wo Contrast  08/07/2014   CLINICAL DATA:  Generalized abdominal pain and cramping. Nausea and vomiting. Superior mesenteric vein thrombosis.  EXAM: CT ABDOMEN AND PELVIS WITHOUT CONTRAST  TECHNIQUE: Multidetector CT imaging of the abdomen and pelvis was performed following the standard protocol without IV contrast.  COMPARISON:  CT on 08/04/2014 and MRI on 08/05/2014  FINDINGS: Lower chest:  New small right pleural effusion.  Hepatobiliary:  No mass visualized on this non-contrast exam.  Pancreas: No mass or inflammatory process visualized on this non-contrast exam.  Spleen:  Within normal limits in size.  Adrenal Glands:  No mass identified.  Kidneys/Urinary tract: No evidence of urolithiasis or hydronephrosis. 3 cm complex cyst with mild mural calcifications in the posterior upper pole the left kidney is stable.  Stomach/Bowel/Peritoneum: Mild increase and mesenteric edema and mild ascites is seen since previous study. Persistent wall thickening is seen involving multiple small bowel loops in the right abdomen. In the setting of superior mesenteric vein thrombosis, this is suspicious for bowel  ischemia. Increased dilatation of proximal small bowel is also seen. No evidence of pneumatosis or free air.  Vascular/Lymphatic: No pathologically enlarged lymph nodes identified. Superior mesenteric vein remains distended, consistent with venous thrombosis as demonstrated on recent MRI.  Reproductive:  No mass or other significant abnormality noted.  Other:  None.  Musculoskeletal:  No suspicious bone lesions identified.  IMPRESSION: Persistent small bowel wall thickening in the right abdomen, suspicious for bowel ischemia in the setting of SMV thrombosis which was demonstrated on recent MRI.  Increased proximal small bowel dilatation, suspicious for small bowel obstruction.  Increase in diffuse mesenteric edema and ascites.  Critical Value/emergent results were called by telephone at the time of interpretation on 08/07/2014 at 2:37 pm to Dr. Louellen Molder , who verbally acknowledged these results.   Electronically Signed   By: Earle Gell M.D.   On: 08/07/2014 14:37      Assessment: Superior mesenteric vein thrombosis with acute ischemic enteritis  Plan: Plans for IR to attempt from the lysis in addition to anticoagulation and antibiotics noted. We will sign off for now. Please call if further GI input needed.    Dayon Witt C 08/08/2014, 11:39 AM

## 2014-08-08 NOTE — Progress Notes (Signed)
ANTICOAGULATION CONSULT NOTE - Follow Up Consult  Pharmacy Consult for Heparin Indication: mesenteric vein thrombosis  Allergies  Allergen Reactions  . Ace Inhibitors Cough  . Lipitor [Atorvastatin] Other (See Comments)    myalgia  . Codeine Itching    Patient Measurements: Height: 6\' 2"  (188 cm) Weight: (!) 307 lb 12.2 oz (139.6 kg) IBW/kg (Calculated) : 82.2 Heparin Dosing Weight: 114 kg  Vital Signs: Temp: 97.7 F (36.5 C) (01/05 0657) Temp Source: Oral (01/05 0657) BP: 140/93 mmHg (01/05 0657) Pulse Rate: 90 (01/05 0657)  Labs:  Recent Labs  08/06/14 0500 08/06/14 0533  08/07/14 0610 08/07/14 1230 08/08/14 1005  HGB 13.8  --   --  13.7  --   --   HCT 44.3  --   --  42.7  --   --   PLT 195  --   --  233  --   --   HEPARINUNFRC 0.14*  --   < > 0.37 0.36 0.16*  CREATININE  --  2.00*  --  1.54*  --   --   < > = values in this interval not displayed.  Estimated Creatinine Clearance: 86.3 mL/min (by C-G formula based on Cr of 1.54).   Assessment: 53 YOM with mesenteric thrombosis on IV heparin. Heparin level 0.14 subtherapeutic on 2650 units/hr, no interruption with infusion, no bleeding note per RN. likely will have IR thrombolysis soon.  LE doppler neg for DVT. CBC wnl and stable  Goal of Therapy:  Heparin level 0.3-0.7 units/ml Monitor platelets by anticoagulation protocol: Yes   Plan:  - Increase heparin rate to 2850 units/hr - f/u 6 hr heparin level at 2100 - f/u plan for thrombolysis  - daily heparin level and CBC  Maryanna Shape, PharmD, BCPS  Clinical Pharmacist  Pager: [phone]   08/08/2014,1:50 PM

## 2014-08-09 ENCOUNTER — Inpatient Hospital Stay (HOSPITAL_COMMUNITY): Payer: BLUE CROSS/BLUE SHIELD

## 2014-08-09 DIAGNOSIS — R600 Localized edema: Secondary | ICD-10-CM

## 2014-08-09 DIAGNOSIS — I81 Portal vein thrombosis: Secondary | ICD-10-CM

## 2014-08-09 DIAGNOSIS — K55069 Acute infarction of intestine, part and extent unspecified: Secondary | ICD-10-CM | POA: Diagnosis present

## 2014-08-09 DIAGNOSIS — Z87891 Personal history of nicotine dependence: Secondary | ICD-10-CM

## 2014-08-09 DIAGNOSIS — R740 Nonspecific elevation of levels of transaminase and lactic acid dehydrogenase [LDH]: Secondary | ICD-10-CM

## 2014-08-09 DIAGNOSIS — K5669 Other intestinal obstruction: Secondary | ICD-10-CM

## 2014-08-09 DIAGNOSIS — R188 Other ascites: Secondary | ICD-10-CM

## 2014-08-09 DIAGNOSIS — Z8673 Personal history of transient ischemic attack (TIA), and cerebral infarction without residual deficits: Secondary | ICD-10-CM

## 2014-08-09 DIAGNOSIS — I8290 Acute embolism and thrombosis of unspecified vein: Secondary | ICD-10-CM | POA: Diagnosis present

## 2014-08-09 LAB — CBC
HCT: 41.5 % (ref 39.0–52.0)
HCT: 41.4 % (ref 39.0–52.0)
HCT: 42.2 % (ref 39.0–52.0)
HCT: 41.3 % (ref 39.0–52.0)
HEMOGLOBIN: 13.3 g/dL (ref 13.0–17.0)
Hemoglobin: 13.4 g/dL (ref 13.0–17.0)
Hemoglobin: 13.5 g/dL (ref 13.0–17.0)
Hemoglobin: 13.4 g/dL (ref 13.0–17.0)
MCH: 30 pg (ref 26.0–34.0)
MCH: 29.3 pg (ref 26.0–34.0)
MCH: 29.3 pg (ref 26.0–34.0)
MCH: 29.5 pg (ref 26.0–34.0)
MCHC: 32.3 g/dL (ref 30.0–36.0)
MCHC: 32.1 g/dL (ref 30.0–36.0)
MCHC: 32 g/dL (ref 30.0–36.0)
MCHC: 32.4 g/dL (ref 30.0–36.0)
MCV: 92.8 fL (ref 78.0–100.0)
MCV: 91.2 fL (ref 78.0–100.0)
MCV: 91.7 fL (ref 78.0–100.0)
MCV: 91 fL (ref 78.0–100.0)
Platelets: 242 10*3/uL (ref 150–400)
Platelets: 244 10*3/uL (ref 150–400)
Platelets: 246 10*3/uL (ref 150–400)
Platelets: 267 10*3/uL (ref 150–400)
RBC: 4.47 MIL/uL (ref 4.22–5.81)
RBC: 4.54 MIL/uL (ref 4.22–5.81)
RBC: 4.6 MIL/uL (ref 4.22–5.81)
RBC: 4.54 MIL/uL (ref 4.22–5.81)
RDW: 14.9 % (ref 11.5–15.5)
RDW: 14.7 % (ref 11.5–15.5)
RDW: 15 % (ref 11.5–15.5)
RDW: 14.7 % (ref 11.5–15.5)
WBC: 6.8 10*3/uL (ref 4.0–10.5)
WBC: 7.7 10*3/uL (ref 4.0–10.5)
WBC: 7.5 10*3/uL (ref 4.0–10.5)
WBC: 7.4 10*3/uL (ref 4.0–10.5)

## 2014-08-09 LAB — GLUCOSE, CAPILLARY
GLUCOSE-CAPILLARY: 76 mg/dL (ref 70–99)
GLUCOSE-CAPILLARY: 102 mg/dL — AB (ref 70–99)
GLUCOSE-CAPILLARY: 113 mg/dL — AB (ref 70–99)
Glucose-Capillary: 91 mg/dL (ref 70–99)

## 2014-08-09 LAB — COMPREHENSIVE METABOLIC PANEL
ALT: 100 U/L — AB (ref 0–53)
ANION GAP: 6 (ref 5–15)
AST: 187 U/L — AB (ref 0–37)
Albumin: 2.1 g/dL — ABNORMAL LOW (ref 3.5–5.2)
Alkaline Phosphatase: 58 U/L (ref 39–117)
BUN: 23 mg/dL (ref 6–23)
CO2: 24 mmol/L (ref 19–32)
Calcium: 7.5 mg/dL — ABNORMAL LOW (ref 8.4–10.5)
Chloride: 107 mEq/L (ref 96–112)
Creatinine, Ser: 1.31 mg/dL (ref 0.50–1.35)
GFR calc Af Amer: 72 mL/min — ABNORMAL LOW (ref >90)
GFR calc non Af Amer: 62 mL/min — ABNORMAL LOW (ref >90)
Glucose, Bld: 91 mg/dL (ref 70–99)
POTASSIUM: 4.2 mmol/L (ref 3.5–5.1)
Sodium: 137 mmol/L (ref 135–145)
Total Bilirubin: 1 mg/dL (ref 0.3–1.2)
Total Protein: 4.5 g/dL — ABNORMAL LOW (ref 6.0–8.3)

## 2014-08-09 LAB — LACTIC ACID, PLASMA: Lactic Acid, Venous: 1.1 mmol/L (ref 0.5–2.2)

## 2014-08-09 LAB — FIBRINOGEN
FIBRINOGEN: 576 mg/dL — AB (ref 204–475)
Fibrinogen: 590 mg/dL — ABNORMAL HIGH (ref 204–475)
Fibrinogen: 557 mg/dL — ABNORMAL HIGH (ref 204–475)

## 2014-08-09 LAB — HEPARIN LEVEL (UNFRACTIONATED)
HEPARIN UNFRACTIONATED: 0.61 [IU]/mL (ref 0.30–0.70)
HEPARIN UNFRACTIONATED: 0.66 [IU]/mL (ref 0.30–0.70)
Heparin Unfractionated: 0.29 IU/mL — ABNORMAL LOW (ref 0.30–0.70)

## 2014-08-09 MED ORDER — MIDAZOLAM HCL 2 MG/2ML IJ SOLN
INTRAMUSCULAR | Status: AC
  Filled 2014-08-09: qty 2

## 2014-08-09 MED ORDER — SODIUM CHLORIDE 0.9 % IV SOLN: 250.0000 mL | INTRAVENOUS | Status: DC | PRN

## 2014-08-09 MED ORDER — FENTANYL CITRATE 0.05 MG/ML IJ SOLN
INTRAMUSCULAR | Status: AC | PRN
  Administered 2014-08-09 (×2): 50 ug via INTRAVENOUS

## 2014-08-09 MED ORDER — LIDOCAINE HCL 1 % IJ SOLN
INTRAMUSCULAR | Status: AC
  Filled 2014-08-09: qty 20

## 2014-08-09 MED ORDER — FENTANYL CITRATE 0.05 MG/ML IJ SOLN
INTRAMUSCULAR | Status: AC
  Filled 2014-08-09: qty 2

## 2014-08-09 MED ORDER — SODIUM CHLORIDE 0.9 % IJ SOLN: 3.0000 mL | INTRAMUSCULAR | Status: DC | PRN

## 2014-08-09 MED ORDER — SODIUM CHLORIDE 0.9 % IV SOLN
INTRAVENOUS | Status: DC
  Administered 2014-08-10: 06:00:00 via INTRAVENOUS

## 2014-08-09 MED ORDER — IOHEXOL 300 MG/ML  SOLN
100.0000 mL | Freq: Once | INTRAMUSCULAR | Status: AC | PRN
  Administered 2014-08-09: 1 mL via INTRAVENOUS

## 2014-08-09 MED ORDER — SODIUM CHLORIDE 0.9 % IV SOLN
0.5000 mg/h | Freq: Once | INTRAVENOUS | Status: DC
  Administered 2014-08-09: 0.5 mg/h via INTRAVENOUS
  Filled 2014-08-09 (×2): qty 24

## 2014-08-09 MED ORDER — SODIUM CHLORIDE 0.9 % IJ SOLN
3.0000 mL | Freq: Two times a day (BID) | INTRAMUSCULAR | Status: DC
  Administered 2014-08-09 – 2014-08-12 (×7): 3 mL via INTRAVENOUS

## 2014-08-09 MED ORDER — MIDAZOLAM HCL 2 MG/2ML IJ SOLN
INTRAMUSCULAR | Status: AC | PRN
  Administered 2014-08-09 (×2): 1 mg via INTRAVENOUS

## 2014-08-09 MED ORDER — CETYLPYRIDINIUM CHLORIDE 0.05 % MT LIQD
7.0000 mL | Freq: Two times a day (BID) | OROMUCOSAL | Status: DC
  Administered 2014-08-09 – 2014-08-10 (×3): 7 mL via OROMUCOSAL

## 2014-08-09 NOTE — Progress Notes (Signed)
Patient ID: Dylan [name], male   DOB: [birth year], 49 y.o.   MRN: [id number]    Subjective: Pt feels better today.  Not having anywhere near as much pain.  Had a BM yesterday.  Tolerating ice chips with no nausea.  Objective: Vital signs in last 24 hours: Temp:  [98.3 F (36.8 C)-99.2 F (37.3 C)] 99.2 F (37.3 C) (01/06 0453) Pulse Rate:  [79-98] 98 (01/06 0700) Resp:  [8-28] 15 (01/06 0700) BP: (103-153)/(68-110) 107/68 mmHg (01/06 0700) SpO2:  [91 %-99 %] 93 % (01/06 0700) Last BM Date: 08/08/14  Intake/Output from previous day: 01/05 0701 - 01/06 0700 In: 4301.3 [I.V.:3351.3; IV Piggyback:950] Out: 2100 [Urine:2100] Intake/Output this shift:    PE: Abd: softer, less distended, but obese, +BS, transhepatic catheter in place Heart: regular Lungs: CTAB  Lab Results:   Recent Labs  08/08/14 1338 08/09/14 0442  WBC 7.3 6.8  HGB 13.9 13.4  HCT 43.3 41.5  PLT 257 242   BMET  Recent Labs  08/08/14 1338 08/09/14 0442  NA 134* 137  K 4.1 4.2  CL 104 107  CO2 23 24  GLUCOSE 106* 91  BUN 24* 23  CREATININE 1.42* 1.31  CALCIUM 7.7* 7.5*   PT/INR No results for input(s): LABPROT, INR in the last 72 hours. CMP     Component Value Date/Time   NA 137 08/09/2014 0442   K 4.2 08/09/2014 0442   CL 107 08/09/2014 0442   CO2 24 08/09/2014 0442   GLUCOSE 91 08/09/2014 0442   BUN 23 08/09/2014 0442   CREATININE 1.31 08/09/2014 0442   CALCIUM 7.5* 08/09/2014 0442   PROT 4.5* 08/09/2014 0442   ALBUMIN 2.1* 08/09/2014 0442   AST 187* 08/09/2014 0442   ALT 100* 08/09/2014 0442   ALKPHOS 58 08/09/2014 0442   BILITOT 1.0 08/09/2014 0442   GFRNONAA 62* 08/09/2014 0442   GFRAA 72* 08/09/2014 0442   Lipase     Component Value Date/Time   LIPASE 59 08/04/2014 0412       Studies/Results: Ct Abdomen Pelvis Wo Contrast  08/07/2014   CLINICAL DATA:  Generalized abdominal pain and cramping. Nausea and vomiting. Superior mesenteric vein thrombosis.  EXAM: CT  ABDOMEN AND PELVIS WITHOUT CONTRAST  TECHNIQUE: Multidetector CT imaging of the abdomen and pelvis was performed following the standard protocol without IV contrast.  COMPARISON:  CT on 08/04/2014 and MRI on 08/05/2014  FINDINGS: Lower chest:  New small right pleural effusion.  Hepatobiliary:  No mass visualized on this non-contrast exam.  Pancreas: No mass or inflammatory process visualized on this non-contrast exam.  Spleen:  Within normal limits in size.  Adrenal Glands:  No mass identified.  Kidneys/Urinary tract: No evidence of urolithiasis or hydronephrosis. 3 cm complex cyst with mild mural calcifications in the posterior upper pole the left kidney is stable.  Stomach/Bowel/Peritoneum: Mild increase and mesenteric edema and mild ascites is seen since previous study. Persistent wall thickening is seen involving multiple small bowel loops in the right abdomen. In the setting of superior mesenteric vein thrombosis, this is suspicious for bowel ischemia. Increased dilatation of proximal small bowel is also seen. No evidence of pneumatosis or free air.  Vascular/Lymphatic: No pathologically enlarged lymph nodes identified. Superior mesenteric vein remains distended, consistent with venous thrombosis as demonstrated on recent MRI.  Reproductive:  No mass or other significant abnormality noted.  Other:  None.  Musculoskeletal:  No suspicious bone lesions identified.  IMPRESSION: Persistent small bowel wall thickening in the right  abdomen, suspicious for bowel ischemia in the setting of SMV thrombosis which was demonstrated on recent MRI.  Increased proximal small bowel dilatation, suspicious for small bowel obstruction.  Increase in diffuse mesenteric edema and ascites.  Critical Value/emergent results were called by telephone at the time of interpretation on 08/07/2014 at 2:37 pm to Dr. Louellen Molder , who verbally acknowledged these results.   Electronically Signed   By: Earle Gell M.D.   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  EXAM: IR ULTRASOUND GUIDANCE VASC ACCESS RIGHT; THROMBOECTOMY MECHANICAL VENOUS; IR INFUSION THROMBOL VENOUS INITIAL (MS); PORTAL VENOGRAPHY; ADDITIONAL ARTERIOGRAPHY  FLUOROSCOPY TIME:  8 min and 6 seconds.  MEDICATIONS AND MEDICAL HISTORY: Versed 4 mg, Fentanyl 175 mcg.  As antibiotic prophylaxis, Ancef was ordered pre-procedure and administered intravenously within one hour of incision.  ANESTHESIA/SEDATION: Moderate sedation time: 52 minutes  CONTRAST:  50 cc Omnipaque 300.  Carbon dioxide.  PROCEDURE: The procedure, risks, benefits, and alternatives were explained to the patient. Questions regarding the procedure were encouraged and answered. The patient understands and consents to the procedure.  The right flank was prepped with Betadine in a sterile fashion, and a sterile drape was applied covering the operative field. A sterile gown and sterile gloves were used for the procedure.  1% lidocaine was utilized for local infiltration. Under sonographic guidance, a 22 gauge Chiba needle was inserted into the peripheral aspect of the main right portal vein. Contrast was injected confirming needle position. The needle was removed over a 018 wire. The Accustick dilator was inserted. A small amount of contrast was injected. Carbon dioxide portal venography was performed.  The Accustick dilator was exchanged over the 3 J for a 5 [place] Kumpe the catheter. This was advanced into the lower portal vein and venography was performed. The catheter was advanced into the upper SMV and contrast was injected. There is partially occlusive thrombus in the main portal vein and occlusive thrombus in the SMV.  The catheter was carefully advanced over a glidewire to the small venous branches of the distal ileum in the right lower quadrant. Contrast was injected demonstrating stasis of flow  within the small branches adjacent to the intestines and complete occlusion within the larger more central branches.  The Kumpe the catheter was removed over a Rosen wire. A 6 French sheath was advanced over the wire to the upper main portal vein. The Angiojet was utilized to mechanically lysed the thrombus for 96 seconds.  Subsequently, a 90 cm 20 cm infusion length 5 French multi side-hole catheter was advanced over the [place] wire to the right lower quadrant. The catheter is positioned along side the thrombus extending from the main portal vein to the ileum. Lysis with 0.5 mg tPA was then instituted. Heparin protocol was resumed per peripheral access.  FINDINGS: Portal venography confirms partial occlusion of the main portal vein and complete occlusion of the SMV. Splenic vein is patent.  Subsequent venogram images confirmed occlusion of mesenteric brain branches to the right lower quadrant. Peripheral branches adjacent to the intestine are patent with stasis of flow.  The neck series of images demonstrate placement of a 6 French sheath and multi side hole catheter along side the thrombus, extending from the main portal vein to the small mesenteric venous branches in the right lower quadrant.  COMPLICATIONS: None  IMPRESSION: Successful mechanical and pharmacological lysis  of thrombus in the main portal vein, superior mesenteric vein, and venous branches in the right lower quadrant. TPA had 0.5 milligrams/hour will be instituted via multi side-hole catheter along side the thrombus.   Electronically Signed   By: Maryclare Bean M.D.   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  EXAM: IR ULTRASOUND GUIDANCE VASC ACCESS RIGHT; THROMBOECTOMY MECHANICAL VENOUS; IR INFUSION THROMBOL VENOUS INITIAL (MS); PORTAL VENOGRAPHY; ADDITIONAL ARTERIOGRAPHY  FLUOROSCOPY TIME:  8 min and 6 seconds.  MEDICATIONS AND MEDICAL  HISTORY: Versed 4 mg, Fentanyl 175 mcg.  As antibiotic prophylaxis, Ancef was ordered pre-procedure and administered intravenously within one hour of incision.  ANESTHESIA/SEDATION: Moderate sedation time: 52 minutes  CONTRAST:  50 cc Omnipaque 300.  Carbon dioxide.  PROCEDURE: The procedure, risks, benefits, and alternatives were explained to the patient. Questions regarding the procedure were encouraged and answered. The patient understands and consents to the procedure.  The right flank was prepped with Betadine in a sterile fashion, and a sterile drape was applied covering the operative field. A sterile gown and sterile gloves were used for the procedure.  1% lidocaine was utilized for local infiltration. Under sonographic guidance, a 22 gauge Chiba needle was inserted into the peripheral aspect of the main right portal vein. Contrast was injected confirming needle position. The needle was removed over a 018 wire. The Accustick dilator was inserted. A small amount of contrast was injected. Carbon dioxide portal venography was performed.  The Accustick dilator was exchanged over the 3 J for a 5 [place] Kumpe the catheter. This was advanced into the lower portal vein and venography was performed. The catheter was advanced into the upper SMV and contrast was injected. There is partially occlusive thrombus in the main portal vein and occlusive thrombus in the SMV.  The catheter was carefully advanced over a glidewire to the small venous branches of the distal ileum in the right lower quadrant. Contrast was injected demonstrating stasis of flow within the small branches adjacent to the intestines and complete occlusion within the larger more central branches.  The Kumpe the catheter was removed over a Rosen wire. A 6 French sheath was advanced over the wire to the upper main portal vein. The Angiojet was utilized to mechanically lysed the thrombus for 96 seconds.  Subsequently, a 90 cm 20 cm infusion length 5 French  multi side-hole catheter was advanced over the [place] wire to the right lower quadrant. The catheter is positioned along side the thrombus extending from the main portal vein to the ileum. Lysis with 0.5 mg tPA was then instituted. Heparin protocol was resumed per peripheral access.  FINDINGS: Portal venography confirms partial occlusion of the main portal vein and complete occlusion of the SMV. Splenic vein is patent.  Subsequent venogram images confirmed occlusion of mesenteric brain branches to the right lower quadrant. Peripheral branches adjacent to the intestine are patent with stasis of flow.  The neck series of images demonstrate placement of a 6 French sheath and multi side hole catheter along side the thrombus, extending from the main portal vein to the small mesenteric venous branches in the right lower quadrant.  COMPLICATIONS: None  IMPRESSION: Successful mechanical and pharmacological lysis of thrombus in the main portal vein, superior mesenteric vein, and venous branches in the right lower quadrant. TPA had 0.5 milligrams/hour will be instituted via multi side-hole catheter along side the  thrombus.   Electronically Signed   By: Maryclare Bean M.D.   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  EXAM: IR ULTRASOUND GUIDANCE VASC ACCESS RIGHT; THROMBOECTOMY MECHANICAL VENOUS; IR INFUSION THROMBOL VENOUS INITIAL (MS); PORTAL VENOGRAPHY; ADDITIONAL ARTERIOGRAPHY  FLUOROSCOPY TIME:  8 min and 6 seconds.  MEDICATIONS AND MEDICAL HISTORY: Versed 4 mg, Fentanyl 175 mcg.  As antibiotic prophylaxis, Ancef was ordered pre-procedure and administered intravenously within one hour of incision.  ANESTHESIA/SEDATION: Moderate sedation time: 52 minutes  CONTRAST:  50 cc Omnipaque 300.  Carbon dioxide.  PROCEDURE: The procedure, risks, benefits, and alternatives were explained to the patient. Questions regarding the  procedure were encouraged and answered. The patient understands and consents to the procedure.  The right flank was prepped with Betadine in a sterile fashion, and a sterile drape was applied covering the operative field. A sterile gown and sterile gloves were used for the procedure.  1% lidocaine was utilized for local infiltration. Under sonographic guidance, a 22 gauge Chiba needle was inserted into the peripheral aspect of the main right portal vein. Contrast was injected confirming needle position. The needle was removed over a 018 wire. The Accustick dilator was inserted. A small amount of contrast was injected. Carbon dioxide portal venography was performed.  The Accustick dilator was exchanged over the 3 J for a 5 [place] Kumpe the catheter. This was advanced into the lower portal vein and venography was performed. The catheter was advanced into the upper SMV and contrast was injected. There is partially occlusive thrombus in the main portal vein and occlusive thrombus in the SMV.  The catheter was carefully advanced over a glidewire to the small venous branches of the distal ileum in the right lower quadrant. Contrast was injected demonstrating stasis of flow within the small branches adjacent to the intestines and complete occlusion within the larger more central branches.  The Kumpe the catheter was removed over a Rosen wire. A 6 French sheath was advanced over the wire to the upper main portal vein. The Angiojet was utilized to mechanically lysed the thrombus for 96 seconds.  Subsequently, a 90 cm 20 cm infusion length 5 French multi side-hole catheter was advanced over the [place] wire to the right lower quadrant. The catheter is positioned along side the thrombus extending from the main portal vein to the ileum. Lysis with 0.5 mg tPA was then instituted. Heparin protocol was resumed per peripheral access.  FINDINGS: Portal venography confirms partial occlusion of the main portal vein and complete  occlusion of the SMV. Splenic vein is patent.  Subsequent venogram images confirmed occlusion of mesenteric brain branches to the right lower quadrant. Peripheral branches adjacent to the intestine are patent with stasis of flow.  The neck series of images demonstrate placement of a 6 French sheath and multi side hole catheter along side the thrombus, extending from the main portal vein to the small mesenteric venous branches in the right lower quadrant.  COMPLICATIONS: None  IMPRESSION: Successful mechanical and pharmacological lysis of thrombus in the main portal vein, superior mesenteric vein, and venous branches in the right lower quadrant. TPA had 0.5 milligrams/hour will be instituted via multi side-hole catheter along side the thrombus.   Electronically Signed   By: Maryclare Bean M.D.   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  EXAM: IR ULTRASOUND GUIDANCE VASC ACCESS RIGHT; THROMBOECTOMY MECHANICAL VENOUS; IR INFUSION THROMBOL VENOUS INITIAL (MS); PORTAL VENOGRAPHY; ADDITIONAL ARTERIOGRAPHY  FLUOROSCOPY TIME:  8 min and 6 seconds.  MEDICATIONS AND MEDICAL HISTORY: Versed 4 mg, Fentanyl 175 mcg.  As antibiotic prophylaxis, Ancef was ordered pre-procedure and administered intravenously within one hour of incision.  ANESTHESIA/SEDATION: Moderate sedation time: 52 minutes  CONTRAST:  50 cc Omnipaque 300.  Carbon dioxide.  PROCEDURE: The procedure, risks, benefits, and alternatives were explained to the patient. Questions regarding the procedure were encouraged and answered. The patient understands and consents to the procedure.  The right flank was prepped with Betadine in a sterile fashion, and a sterile drape was applied covering the operative field. A sterile gown and sterile gloves were used for the procedure.  1% lidocaine was utilized for local infiltration. Under sonographic guidance, a 22 gauge Chiba  needle was inserted into the peripheral aspect of the main right portal vein. Contrast was injected confirming needle position. The needle was removed over a 018 wire. The Accustick dilator was inserted. A small amount of contrast was injected. Carbon dioxide portal venography was performed.  The Accustick dilator was exchanged over the 3 J for a 5 [place] Kumpe the catheter. This was advanced into the lower portal vein and venography was performed. The catheter was advanced into the upper SMV and contrast was injected. There is partially occlusive thrombus in the main portal vein and occlusive thrombus in the SMV.  The catheter was carefully advanced over a glidewire to the small venous branches of the distal ileum in the right lower quadrant. Contrast was injected demonstrating stasis of flow within the small branches adjacent to the intestines and complete occlusion within the larger more central branches.  The Kumpe the catheter was removed over a Rosen wire. A 6 French sheath was advanced over the wire to the upper main portal vein. The Angiojet was utilized to mechanically lysed the thrombus for 96 seconds.  Subsequently, a 90 cm 20 cm infusion length 5 French multi side-hole catheter was advanced over the [place] wire to the right lower quadrant. The catheter is positioned along side the thrombus extending from the main portal vein to the ileum. Lysis with 0.5 mg tPA was then instituted. Heparin protocol was resumed per peripheral access.  FINDINGS: Portal venography confirms partial occlusion of the main portal vein and complete occlusion of the SMV. Splenic vein is patent.  Subsequent venogram images confirmed occlusion of mesenteric brain branches to the right lower quadrant. Peripheral branches adjacent to the intestine are patent with stasis of flow.  The neck series of images demonstrate placement of a 6 French sheath and multi side hole catheter along side the thrombus, extending from the main portal vein  to the small mesenteric venous branches in the right lower quadrant.  COMPLICATIONS: None  IMPRESSION: Successful mechanical and pharmacological lysis of thrombus in the main portal vein, superior mesenteric vein, and venous branches in the right lower quadrant. TPA had 0.5 milligrams/hour will be instituted via multi side-hole catheter along side the thrombus.   Electronically Signed   By: Maryclare Bean M.D.   On: 08/08/2014 17:21   Ir Infusion Thrombol Venous Initial (ms)  08/08/2014   CLINICAL DATA:  Superior mesenteric vein and portal vein DVT with bowel ischemia.  EXAM: IR ULTRASOUND GUIDANCE VASC ACCESS RIGHT; THROMBOECTOMY MECHANICAL VENOUS; IR INFUSION THROMBOL VENOUS INITIAL (MS); PORTAL VENOGRAPHY; ADDITIONAL ARTERIOGRAPHY  FLUOROSCOPY TIME:  8 min and 6 seconds.  MEDICATIONS AND MEDICAL HISTORY: Versed 4 mg, Fentanyl 175 mcg.  As antibiotic prophylaxis, Ancef was ordered pre-procedure and administered intravenously within one hour of incision.  ANESTHESIA/SEDATION: Moderate sedation time: 52 minutes  CONTRAST:  50 cc Omnipaque 300.  Carbon dioxide.  PROCEDURE: The procedure, risks, benefits, and alternatives were explained to the patient. Questions regarding the procedure were encouraged and answered. The patient understands and consents to the procedure.  The right flank was prepped with Betadine in a sterile fashion, and a sterile drape was applied covering the operative field. A sterile gown and sterile gloves were used for the procedure.  1% lidocaine was utilized for local infiltration. Under sonographic guidance, a 22 gauge Chiba needle was inserted into the peripheral aspect of the main right portal vein. Contrast was injected confirming needle position. The needle was removed over a 018 wire. The Accustick dilator was inserted. A small amount of contrast was injected. Carbon dioxide portal venography was performed.  The Accustick dilator was exchanged over the 3 J for a 5 [place] Kumpe the catheter.  This was advanced into the lower portal vein and venography was performed. The catheter was advanced into the upper SMV and contrast was injected. There is partially occlusive thrombus in the main portal vein and occlusive thrombus in the SMV.  The catheter was carefully advanced over a glidewire to the small venous branches of the distal ileum in the right lower quadrant. Contrast was injected demonstrating stasis of flow within the small branches adjacent to the intestines and complete occlusion within the larger more central branches.  The Kumpe the catheter was removed over a [place] wire. A 6 French sheath was advanced over the wire to the upper main portal vein. The Angiojet was utilized to mechanically lysed the thrombus for 96 seconds.  Subsequently, a 90 cm 20 cm infusion length 5 French multi side-hole catheter was advanced over the [place] wire to the right lower quadrant. The catheter is positioned along side the thrombus extending from the main portal vein to the ileum. Lysis with 0.5 mg tPA was then instituted. Heparin protocol was resumed per peripheral access.  FINDINGS: Portal venography confirms partial occlusion of the main portal vein and complete occlusion of the SMV. Splenic vein is patent.  Subsequent venogram images confirmed occlusion of mesenteric brain branches to the right lower quadrant. Peripheral branches adjacent to the intestine are patent with stasis of flow.  The neck series of images demonstrate placement of a 6 French sheath and multi side hole catheter along side the thrombus, extending from the main portal vein to the small mesenteric venous branches in the right lower quadrant.  COMPLICATIONS: None  IMPRESSION: Successful mechanical and pharmacological lysis of thrombus in the main portal vein, superior mesenteric vein, and venous branches in the right lower quadrant. TPA had 0.5 milligrams/hour will be instituted via multi side-hole catheter along side the thrombus.    Electronically Signed   By: Maryclare Bean M.D.   On: 08/08/2014 17:21    Anti-infectives: Anti-infectives    Start     Dose/Rate Route Frequency Ordered Stop   08/08/14 1418  ceFAZolin (ANCEF) IVPB 2 g/50 mL premix     2 g100 mL/hr over 30 Minutes Intravenous 60 min pre-op 08/08/14 1413     08/08/14 1410  ceFAZolin (ANCEF) 2-3 GM-% IVPB SOLR    Comments:  Reather Converse   : cabinet override      08/08/14 1410  08/09/14 0214   08/04/14 0845  ciprofloxacin (CIPRO) IVPB 400 mg     400 mg200 mL/hr over 60 Minutes Intravenous Every 12 hours 08/04/14 0834     08/04/14 0845  metroNIDAZOLE (FLAGYL) IVPB 500 mg     500 mg100 mL/hr over 60 Minutes Intravenous Every 8 hours 08/04/14 0834         Assessment/Plan   1. SMV thrombus 2. pSBO secondary to ischemic enteritis from #1 3. AKI, resolved  Plan: 1. Patient seems much better today.  No evidence of bleeding.  hgb stable.  Pain is much better and patient is now passing stool.  I suspect he could have clear liquids from our standpoint.  Will follow.  LOS: 5 days    Mariachristina Holle E 08/09/2014, 7:41 AM Pager: [phone]

## 2014-08-09 NOTE — Progress Notes (Signed)
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 Rosette Reveal -suspect a degree of ischemic hepatitis -trend -reconsult GI if worsening -hep b surface antigen, hep c antibody Acute kidney injury -Likely by an depletion in the setting of hemodynamic changes from relative hypotension at the time of admission -Improving -Continue IV fluids -Renal ultrasound negative for hydronephrosis CAD s/p PCI/stent (2013) -No chest pain presently -Continue beta blocker -?need for ASA while on [facility]   Family Communication:   Pt at beside Disposition Plan:  Remain in ICU       Procedures/Studies: Ct Abdomen Pelvis Wo Contrast  08/07/2014   CLINICAL DATA:  Generalized abdominal pain and cramping. Nausea and vomiting. Superior mesenteric vein thrombosis.  EXAM: CT ABDOMEN AND PELVIS WITHOUT CONTRAST  TECHNIQUE: Multidetector CT imaging of the abdomen and pelvis was performed following the standard protocol without IV contrast.  COMPARISON:  CT on 08/04/2014 and MRI on 08/05/2014  FINDINGS: Lower chest:  New small right pleural effusion.  Hepatobiliary:  No mass visualized on this non-contrast exam.  Pancreas: No mass or inflammatory process visualized on this non-contrast exam.  Spleen:   Within normal limits in size.  Adrenal Glands:  No mass identified.  Kidneys/Urinary tract: No evidence of urolithiasis or hydronephrosis. 3 cm complex cyst with mild mural calcifications in the posterior upper pole the left kidney is stable.  Stomach/Bowel/Peritoneum: Mild increase and mesenteric edema and mild ascites is seen since previous study. Persistent wall thickening is seen involving multiple small bowel loops in the right abdomen. In the setting of superior mesenteric vein thrombosis, this is suspicious for bowel ischemia. Increased dilatation of proximal small bowel is also seen. No evidence of pneumatosis or free air.  Vascular/Lymphatic: No pathologically enlarged lymph nodes identified. Superior mesenteric vein remains distended, consistent with venous thrombosis as demonstrated on recent MRI.  Reproductive:  No mass or other significant abnormality noted.  Other:  None.  Musculoskeletal:  No suspicious bone lesions identified.  IMPRESSION: Persistent small bowel wall thickening in the right abdomen, suspicious for bowel ischemia in the setting of SMV thrombosis which was demonstrated on recent MRI.  Increased proximal small bowel dilatation, suspicious for small bowel obstruction.  Increase in diffuse mesenteric edema and ascites.  Critical Value/emergent results were called by telephone at the time of interpretation on 08/07/2014 at 2:37 pm to Dr. Louellen Molder , who verbally acknowledged these results.   Electronically Signed   By: Earle Gell M.D.   On: 08/07/2014 14:37   Ct Abdomen Pelvis Wo Contrast  08/04/2014   CLINICAL DATA:  Generalized abdomen pain, cramps, nausea, vomiting.  EXAM: CT ABDOMEN AND PELVIS WITHOUT CONTRAST  TECHNIQUE: Multidetector CT imaging of the abdomen and pelvis was performed following the standard protocol without IV contrast. There is oral contrast in the stomach only.  COMPARISON:  July 06, 2002  FINDINGS: The liver,  spleen, pancreas, gallbladder, adrenal glands  and right kidney are normal. There is no hydronephrosis bilaterally. There is a 2.3 x 3 cm slight low-density lesion in the posterior midpole left kidney with minimal rim calcification ; on the previous CT, a simple cysts was present in the same location. There is atherosclerosis of the abdominal aorta without aneurysmal dilatation. There is no abdominal lymphadenopathy.  There are several loops of abnormal enlarged thick walled small bowel loops with surrounding inflammation and fluid in the right lower quadrant. There is no free air. The appendix is not definitely seen. The colon is normal.  Fluid-filled bladder is normal. Small amount of free fluid is identified in the pelvis. The lung bases are clear. Degenerative joint changes of the spine are noted.  IMPRESSION: Several loops of abnormal enlarged thick walled small bowel loops with surrounding inflammation and fluid in the right lower quadrant. The findings are nonspecific but can be due to infectious or inflammatory etiology.   Electronically Signed   By: Abelardo Diesel M.D.   On: 08/04/2014 07:37   Mr Abdomen Wo Contrast  08/05/2014   CLINICAL DATA:  Abdominal pain nausea and cramping. eval for mesenteric vein thrombosis. Pt is 300lbs and extremely claus.  EXAM: MRI/ MRA ABDOMEN WITHOUT CONTRAST  TECHNIQUE: Multiplanar, multiecho pulse sequences of the abdomen were obtained WITHOUT intravenous contrast. Angiographic images of abdomen were obtained using MRA technique WITHOUT intravenous contrast.Pre meds were given. Pt unable to stay awake for breath hold sequances. Was attemting to repeat axial ssfse lower in to the pelvis when pt became spastic and crawling out of the scanner. Unable to obtain any further imaging  : COMPARISON:  CT 08/04/2014 AND EARLIER STUDIES  FINDINGS: There is occlusive thrombus through the visualized superior mesenteric vein and main portal vein, terminating at the portal bifurcation. There may be small amount of nonocclusive  thrombus extending into the left portal vein. Normal flow signal in the splenic vein.  Limited assessment of the abdominal aorta and proximal visceral and renal branches grossly unremarkable. IVC is patent.  There is a small amount perihepatic ,perisplenic, right lower quadrant mesenteric, and pelvic ascites. No focal liver lesion identified. 2.6 cm fluid signal partially exophytic probable cyst from the interpolar region left kidney. Right kidney, adrenal glands, pancreas, and nondilated gallbladder unremarkable.  IMPRESSION:  1. Occlusive thrombus in superior mesenteric vein extending through the main portal vein. 2. Abdominal ascites 3. Left renal cyst.   Electronically Signed   By: Arne Cleveland M.D.   On: 08/05/2014 15:34   Mr Jodene Nam Abdomen Wo Contrast  08/05/2014   CLINICAL DATA:  Abdominal pain nausea and cramping. eval for mesenteric vein thrombosis. Pt is 300lbs and extremely claus.  EXAM: MRI/ MRA ABDOMEN WITHOUT CONTRAST  TECHNIQUE: Multiplanar, multiecho pulse sequences of the abdomen were obtained WITHOUT intravenous contrast. Angiographic images of abdomen were obtained using MRA technique WITHOUT intravenous contrast.Pre meds were given. Pt unable to stay awake for breath hold sequances. Was attemting to repeat axial ssfse lower in to the pelvis when pt became spastic and crawling out of the scanner. Unable to obtain any further imaging  : COMPARISON:  CT 08/04/2014 AND EARLIER STUDIES  FINDINGS: There is occlusive thrombus through the visualized superior mesenteric vein and main portal vein, terminating at the portal bifurcation. There may be small amount of nonocclusive thrombus extending into the left portal vein. Normal flow signal in the splenic vein.  Limited assessment of the abdominal aorta and proximal visceral and renal  branches grossly unremarkable. IVC is patent.  There is a small amount perihepatic ,perisplenic, right lower quadrant mesenteric, and pelvic ascites. No focal liver lesion  identified. 2.6 cm fluid signal partially exophytic probable cyst from the interpolar region left kidney. Right kidney, adrenal glands, pancreas, and nondilated gallbladder unremarkable.  IMPRESSION:  1. Occlusive thrombus in superior mesenteric vein extending through the main portal vein. 2. Abdominal ascites 3. Left renal cyst.   Electronically Signed   By: Arne Cleveland M.D.   On: 08/05/2014 15:34   Ir Angiogram Selective Each Additional Vessel  08/08/2014   CLINICAL DATA:  Superior mesenteric vein and portal vein DVT with bowel ischemia.  EXAM: IR ULTRASOUND GUIDANCE VASC ACCESS RIGHT; THROMBOECTOMY MECHANICAL VENOUS; IR INFUSION THROMBOL VENOUS INITIAL (MS); PORTAL VENOGRAPHY; ADDITIONAL ARTERIOGRAPHY  FLUOROSCOPY TIME:  8 min and 6 seconds.  MEDICATIONS AND MEDICAL HISTORY: Versed 4 mg, Fentanyl 175 mcg.  As antibiotic prophylaxis, Ancef was ordered pre-procedure and administered intravenously within one hour of incision.  ANESTHESIA/SEDATION: Moderate sedation time: 52 minutes  CONTRAST:  50 cc Omnipaque 300.  Carbon dioxide.  PROCEDURE: The procedure, risks, benefits, and alternatives were explained to the patient. Questions regarding the procedure were encouraged and answered. The patient understands and consents to the procedure.  The right flank was prepped with Betadine in a sterile fashion, and a sterile drape was applied covering the operative field. A sterile gown and sterile gloves were used for the procedure.  1% lidocaine was utilized for local infiltration. Under sonographic guidance, a 22 gauge Chiba needle was inserted into the peripheral aspect of the main right portal vein. Contrast was injected confirming needle position. The needle was removed over a 018 wire. The Accustick dilator was inserted. A small amount of contrast was injected. Carbon dioxide portal venography was performed.  The Accustick dilator was exchanged over the 3 J for a 5 [place] Kumpe the catheter. This was advanced  into the lower portal vein and venography was performed. The catheter was advanced into the upper SMV and contrast was injected. There is partially occlusive thrombus in the main portal vein and occlusive thrombus in the SMV.  The catheter was carefully advanced over a glidewire to the small venous branches of the distal ileum in the right lower quadrant. Contrast was injected demonstrating stasis of flow within the small branches adjacent to the intestines and complete occlusion within the larger more central branches.  The Kumpe the catheter was removed over a Rosen wire. A 6 French sheath was advanced over the wire to the upper main portal vein. The Angiojet was utilized to mechanically lysed the thrombus for 96 seconds.  Subsequently, a 90 cm 20 cm infusion length 5 French multi side-hole catheter was advanced over the [place] wire to the right lower quadrant. The catheter is positioned along side the thrombus extending from the main portal vein to the ileum. Lysis with 0.5 mg tPA was then instituted. Heparin protocol was resumed per peripheral access.  FINDINGS: Portal venography confirms partial occlusion of the main portal vein and complete occlusion of the SMV. Splenic vein is patent.  Subsequent venogram images confirmed occlusion of mesenteric brain branches to the right lower quadrant. Peripheral branches adjacent to the intestine are patent with stasis of flow.  The neck series of images demonstrate placement of a 6 French sheath and multi side hole catheter along side the thrombus, extending from the main portal vein to the small mesenteric venous branches in the right lower quadrant.  COMPLICATIONS:  None  IMPRESSION: Successful mechanical and pharmacological lysis of thrombus in the main portal vein, superior mesenteric vein, and venous branches in the right lower quadrant. TPA had 0.5 milligrams/hour will be instituted via multi side-hole catheter along side the thrombus.   Electronically Signed   By:  Maryclare Bean M.D.   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  EXAM: IR ULTRASOUND GUIDANCE VASC ACCESS RIGHT; THROMBOECTOMY MECHANICAL VENOUS; IR INFUSION THROMBOL VENOUS INITIAL (MS); PORTAL VENOGRAPHY; ADDITIONAL ARTERIOGRAPHY  FLUOROSCOPY TIME:  8 min and 6 seconds.  MEDICATIONS AND MEDICAL HISTORY: Versed 4 mg, Fentanyl 175 mcg.  As antibiotic prophylaxis, Ancef was ordered pre-procedure and administered intravenously within one hour of incision.  ANESTHESIA/SEDATION: Moderate sedation time: 52 minutes  CONTRAST:  50 cc Omnipaque 300.  Carbon dioxide.  PROCEDURE: The procedure, risks, benefits, and alternatives were explained to the patient. Questions regarding the procedure were encouraged and answered. The patient understands and consents to the procedure.  The right flank was prepped with Betadine in a sterile fashion, and a sterile drape was applied covering the operative field. A sterile gown and sterile gloves were used for the procedure.  1% lidocaine was utilized for local infiltration. Under sonographic guidance, a 22 gauge Chiba needle was inserted into the peripheral aspect of the main right portal vein. Contrast was injected confirming needle position. The needle was removed over a 018 wire. The Accustick dilator was inserted. A small amount of contrast was injected. Carbon dioxide portal venography was performed.  The Accustick dilator was exchanged over the 3 J for a 5 [place] Kumpe the catheter. This was advanced into the lower portal vein and venography was performed. The catheter was advanced into the upper SMV and contrast was injected. There is partially occlusive thrombus in the main portal vein and occlusive thrombus in the SMV.  The catheter was carefully advanced over a glidewire to the small venous branches of the distal ileum in the right lower quadrant. Contrast was injected  demonstrating stasis of flow within the small branches adjacent to the intestines and complete occlusion within the larger more central branches.  The Kumpe the catheter was removed over a Rosen wire. A 6 French sheath was advanced over the wire to the upper main portal vein. The Angiojet was utilized to mechanically lysed the thrombus for 96 seconds.  Subsequently, a 90 cm 20 cm infusion length 5 French multi side-hole catheter was advanced over the [place] wire to the right lower quadrant. The catheter is positioned along side the thrombus extending from the main portal vein to the ileum. Lysis with 0.5 mg tPA was then instituted. Heparin protocol was resumed per peripheral access.  FINDINGS: Portal venography confirms partial occlusion of the main portal vein and complete occlusion of the SMV. Splenic vein is patent.  Subsequent venogram images confirmed occlusion of mesenteric brain branches to the right lower quadrant. Peripheral branches adjacent to the intestine are patent with stasis of flow.  The neck series of images demonstrate placement of a 6 French sheath and multi side hole catheter along side the thrombus, extending from the main portal vein to the small mesenteric venous branches in the right lower quadrant.  COMPLICATIONS: None  IMPRESSION: Successful mechanical and pharmacological lysis of thrombus in the main portal vein, superior mesenteric vein, and venous branches in the right lower quadrant. TPA had 0.5 milligrams/hour will be  instituted via multi side-hole catheter along side the thrombus.   Electronically Signed   By: Maryclare Bean M.D.   On: 08/08/2014 17:21   US Renal Port  08/05/2014   CLINICAL DATA:  Acute renal injury.  Hypertension.  EXAM: RENAL/URINARY TRACT ULTRASOUND COMPLETE  COMPARISON:  08/04/2014  FINDINGS: Right Kidney:  Length: 13.0 cm. Echogenicity within normal limits. No mass or hydronephrosis visualized.  Left Kidney:  Length: 12.3 cm. Echogenicity within normal limits. No  hydronephrosis. 2.2 by 1.7 cm complex exophytic lesion of the left mid kidney, as shown on CT scan.  Bladder:  Appears normal for degree of bladder distention.  Other: Small amount of ascites adjacent to the right hepatic lobe. 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  IMPRESSION: 1. 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 This could be confirmed with noncontrast 3D time-of-flight MRI of the mesenteric vasculature, if clinically warranted. 2. Small amount of ascites adjacent to the right hepatic lobe. 3. The 2.2 by 1.7 cm exophytic lesion of the left mid kidney is confirmed to be complex by ultrasound. I do note that there was a reasonably benign appearing cyst in this location on 07/06/2002. Although likely benign, the lesion is not completely specific on today's exam. These results were called by telephone at the time of interpretation on 08/05/2014 at 12:42 pm to Dr. Louellen Molder , who verbally acknowledged these results.   Electronically Signed   By: Sherryl Barters M.D.   On: 08/05/2014 12:46   Ir Thrombect Veno Mech Mod Sed  08/08/2014   CLINICAL DATA:  Superior mesenteric vein and portal vein DVT with bowel ischemia.  EXAM: IR ULTRASOUND GUIDANCE VASC ACCESS RIGHT; THROMBOECTOMY MECHANICAL VENOUS; IR INFUSION THROMBOL VENOUS INITIAL (MS); PORTAL VENOGRAPHY; ADDITIONAL ARTERIOGRAPHY  FLUOROSCOPY TIME:  8 min and 6 seconds.  MEDICATIONS AND MEDICAL HISTORY: Versed 4 mg, Fentanyl 175 mcg.  As antibiotic prophylaxis, Ancef was ordered pre-procedure and administered intravenously within one hour of incision.  ANESTHESIA/SEDATION: Moderate sedation time: 52 minutes  CONTRAST:  50 cc Omnipaque 300.  Carbon dioxide.  PROCEDURE: The procedure, risks, benefits, and alternatives were explained to the  patient. Questions regarding the procedure were encouraged and answered. The patient understands and consents to the procedure.  The right flank was prepped with Betadine in a sterile fashion, and a sterile drape was applied covering the operative field. A sterile gown and sterile gloves were used for the procedure.  1% lidocaine was utilized for local infiltration. Under sonographic guidance, a 22 gauge Chiba needle was inserted into the peripheral aspect of the main right portal vein. Contrast was injected confirming needle position. The needle was removed over a 018 wire. The Accustick dilator was inserted. A small amount of contrast was injected. Carbon dioxide portal venography was performed.  The Accustick dilator was exchanged over the 3 J for a 5 [place] Kumpe the catheter. This was advanced into the lower portal vein and venography was performed. The catheter was advanced into the upper SMV and contrast was injected. There is partially occlusive thrombus in the main portal vein and occlusive thrombus in the SMV.  The catheter was carefully advanced over a glidewire to the small venous branches of the distal ileum in the right lower quadrant. Contrast was injected demonstrating stasis of flow within the  small branches adjacent to the intestines and complete occlusion within the larger more central branches.  The Kumpe the catheter was removed over a Rosen wire. A 6 French sheath was advanced over the wire to the upper main portal vein. The Angiojet was utilized to mechanically lysed the thrombus for 96 seconds.  Subsequently, a 90 cm 20 cm infusion length 5 French multi side-hole catheter was advanced over the [place] wire to the right lower quadrant. The catheter is positioned along side the thrombus extending from the main portal vein to the ileum. Lysis with 0.5 mg tPA was then instituted. Heparin protocol was resumed per peripheral access.  FINDINGS: Portal venography confirms partial occlusion of the main  portal vein and complete occlusion of the SMV. Splenic vein is patent.  Subsequent venogram images confirmed occlusion of mesenteric brain branches to the right lower quadrant. Peripheral branches adjacent to the intestine are patent with stasis of flow.  The neck series of images demonstrate placement of a 6 French sheath and multi side hole catheter along side the thrombus, extending from the main portal vein to the small mesenteric venous branches in the right lower quadrant.  COMPLICATIONS: None  IMPRESSION: Successful mechanical and pharmacological lysis of thrombus in the main portal vein, superior mesenteric vein, and venous branches in the right lower quadrant. TPA had 0.5 milligrams/hour will be instituted via multi side-hole catheter along side the thrombus.   Electronically Signed   By: Maryclare Bean M.D.   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  EXAM: IR ULTRASOUND GUIDANCE VASC ACCESS RIGHT; THROMBOECTOMY MECHANICAL VENOUS; IR INFUSION THROMBOL VENOUS INITIAL (MS); PORTAL VENOGRAPHY; ADDITIONAL ARTERIOGRAPHY  FLUOROSCOPY TIME:  8 min and 6 seconds.  MEDICATIONS AND MEDICAL HISTORY: Versed 4 mg, Fentanyl 175 mcg.  As antibiotic prophylaxis, Ancef was ordered pre-procedure and administered intravenously within one hour of incision.  ANESTHESIA/SEDATION: Moderate sedation time: 52 minutes  CONTRAST:  50 cc Omnipaque 300.  Carbon dioxide.  PROCEDURE: The procedure, risks, benefits, and alternatives were explained to the patient. Questions regarding the procedure were encouraged and answered. The patient understands and consents to the procedure.  The right flank was prepped with Betadine in a sterile fashion, and a sterile drape was applied covering the operative field. A sterile gown and sterile gloves were used for the procedure.  1% lidocaine was utilized for local infiltration. Under sonographic  guidance, a 22 gauge Chiba needle was inserted into the peripheral aspect of the main right portal vein. Contrast was injected confirming needle position. The needle was removed over a 018 wire. The Accustick dilator was inserted. A small amount of contrast was injected. Carbon dioxide portal venography was performed.  The Accustick dilator was exchanged over the 3 J for a 5 [place] Kumpe the catheter. This was advanced into the lower portal vein and venography was performed. The catheter was advanced into the upper SMV and contrast was injected. There is partially occlusive thrombus in the main portal vein and occlusive thrombus in the SMV.  The catheter was carefully advanced over a glidewire to the small venous branches of the distal ileum in the right lower quadrant. Contrast was injected demonstrating stasis of flow within the small branches adjacent to the intestines and complete occlusion within the larger more central branches.  The Kumpe the catheter was removed over a Rosen wire. A 6 French sheath was  advanced over the wire to the upper main portal vein. The Angiojet was utilized to mechanically lysed the thrombus for 96 seconds.  Subsequently, a 90 cm 20 cm infusion length 5 French multi side-hole catheter was advanced over the [place] wire to the right lower quadrant. The catheter is positioned along side the thrombus extending from the main portal vein to the ileum. Lysis with 0.5 mg tPA was then instituted. Heparin protocol was resumed per peripheral access.  FINDINGS: Portal venography confirms partial occlusion of the main portal vein and complete occlusion of the SMV. Splenic vein is patent.  Subsequent venogram images confirmed occlusion of mesenteric brain branches to the right lower quadrant. Peripheral branches adjacent to the intestine are patent with stasis of flow.  The neck series of images demonstrate placement of a 6 French sheath and multi side hole catheter along side the thrombus,  extending from the main portal vein to the small mesenteric venous branches in the right lower quadrant.  COMPLICATIONS: None  IMPRESSION: Successful mechanical and pharmacological lysis of thrombus in the main portal vein, superior mesenteric vein, and venous branches in the right lower quadrant. TPA had 0.5 milligrams/hour will be instituted via multi side-hole catheter along side the thrombus.   Electronically Signed   By: Maryclare Bean M.D.   On: 08/08/2014 17:21   Dg Abd Acute W/chest  08/04/2014   CLINICAL DATA:  Abdominal pain  EXAM: ACUTE ABDOMEN SERIES (ABDOMEN 2 VIEW & CHEST 1 VIEW)  COMPARISON:  04/17/2013 chest x-ray  FINDINGS: The abdomen is essentially gasless, which limits the utility of radiography. Few bubbles noted in the right lower quadrant. There is no concerning intra-abdominal mass effect or calcification. No pneumoperitoneum. Previous lower abdominal wall mesh repair. Normal heart size and mediastinal contours. No acute infiltrate or edema. No effusion or pneumothorax. No acute osseous findings.  IMPRESSION: 1. There is no definitive obstruction, but this technique is limited by relatively gasless abdomen. Fluid dilated loops of bowel would be missed on this study. 2. Negative chest.   Electronically Signed   By: Jorje Guild M.D.   On: 08/04/2014 06:29   Dg Abd Portable 1v  08/05/2014   CLINICAL DATA:  Subsequent evaluation for right-sided abdominal pain with nausea vomiting and diarrhea  EXAM: PORTABLE ABDOMEN - 1 VIEW  COMPARISON:  08/04/2014  FINDINGS: Mild gaseous distention of the stomach. Minimal gas elsewhere in the abdomen. There is a small amount of oral contrast appreciated throughout the colon.  IMPRESSION: Nonspecific gas pattern.   Electronically Signed   By: Skipper Cliche M.D.   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  EXAM: IR ULTRASOUND GUIDANCE VASC  ACCESS RIGHT; THROMBOECTOMY MECHANICAL VENOUS; IR INFUSION THROMBOL VENOUS INITIAL (MS); PORTAL VENOGRAPHY; ADDITIONAL ARTERIOGRAPHY  FLUOROSCOPY TIME:  8 min and 6 seconds.  MEDICATIONS AND MEDICAL HISTORY: Versed 4 mg, Fentanyl 175 mcg.  As antibiotic prophylaxis, Ancef was ordered pre-procedure and administered intravenously within one hour of incision.  ANESTHESIA/SEDATION: Moderate sedation time: 52 minutes  CONTRAST:  50 cc Omnipaque 300.  Carbon dioxide.  PROCEDURE: The procedure, risks, benefits, and alternatives were explained to the patient. Questions regarding the procedure were encouraged and answered. The patient understands and consents to the procedure.  The right flank was prepped with Betadine in a sterile fashion, and a sterile drape was applied covering the operative field. A sterile gown  and sterile gloves were used for the procedure.  1% lidocaine was utilized for local infiltration. Under sonographic guidance, a 22 gauge Chiba needle was inserted into the peripheral aspect of the main right portal vein. Contrast was injected confirming needle position. The needle was removed over a 018 wire. The Accustick dilator was inserted. A small amount of contrast was injected. Carbon dioxide portal venography was performed.  The Accustick dilator was exchanged over the 3 J for a 5 [place] Kumpe the catheter. This was advanced into the lower portal vein and venography was performed. The catheter was advanced into the upper SMV and contrast was injected. There is partially occlusive thrombus in the main portal vein and occlusive thrombus in the SMV.  The catheter was carefully advanced over a glidewire to the small venous branches of the distal ileum in the right lower quadrant. Contrast was injected demonstrating stasis of flow within the small branches adjacent to the intestines and complete occlusion within the larger more central branches.  The Kumpe the catheter was removed over a Rosen wire. A 6  French sheath was advanced over the wire to the upper main portal vein. The Angiojet was utilized to mechanically lysed the thrombus for 96 seconds.  Subsequently, a 90 cm 20 cm infusion length 5 French multi side-hole catheter was advanced over the [place] wire to the right lower quadrant. The catheter is positioned along side the thrombus extending from the main portal vein to the ileum. Lysis with 0.5 mg tPA was then instituted. Heparin protocol was resumed per peripheral access.  FINDINGS: Portal venography confirms partial occlusion of the main portal vein and complete occlusion of the SMV. Splenic vein is patent.  Subsequent venogram images confirmed occlusion of mesenteric brain branches to the right lower quadrant. Peripheral branches adjacent to the intestine are patent with stasis of flow.  The neck series of images demonstrate placement of a 6 French sheath and multi side hole catheter along side the thrombus, extending from the main portal vein to the small mesenteric venous branches in the right lower quadrant.  COMPLICATIONS: None  IMPRESSION: Successful mechanical and pharmacological lysis of thrombus in the main portal vein, superior mesenteric vein, and venous branches in the right lower quadrant. TPA had 0.5 milligrams/hour will be instituted via multi side-hole catheter along side the thrombus.   Electronically Signed   By: Maryclare Bean M.D.   On: 08/08/2014 17:21         Subjective: Patient states that abdominal pain is much improved after TPA. Denies any nausea, vomiting, diarrhea. He had 1 bowel movement yesterday without any hematochezia or melena. Denies any fevers, chills, chest pain, shortness of breath   Objective: Filed Vitals:   08/09/14 0800 08/09/14 0817 08/09/14 0822 08/09/14 0830  BP: 118/78   125/78  Pulse: 89   99  Temp:   99 F (37.2 C)   TempSrc:   Oral   Resp: 25 23  23   Height:      Weight:      SpO2: 94% 93%  93%    Intake/Output Summary (Last 24 hours) at  08/09/14 1123 Last data filed at 08/09/14 0800  Gross per 24 hour  Intake 4462.7 ml  Output   1500 ml  Net 2962.7 ml   Weight change:  Exam:   General:  Pt is alert, follows commands appropriately, not in acute distress  HEENT: No icterus, No thrush,  [place]/AT  Cardiovascular: RRR, S1/S2, no rubs, no gallops  Respiratory:  Diminished breath sounds at the bases. No wheezing. Good air movement.  Abdomen: Soft/+BS, diffusely tender with mild distention. No peritoneal signs no guarding  Extremities: 2+ LE edema, No lymphangitis, No petechiae, No rashes, no synovitis  Data Reviewed: Basic Metabolic Panel:  Recent Labs Lab 08/05/14 0519 08/06/14 0533 08/07/14 0610 08/08/14 1338 08/09/14 0442  NA 138 133* 132* 134* 137  K 4.7 4.7 4.4 4.1 4.2  CL 98 99 99 104 107  CO2 32 28 30 23 24   GLUCOSE 128* 126* 122* 106* 91  BUN 52* 52* 35* 24* 23  CREATININE 2.66* 2.00* 1.54* 1.42* 1.31  CALCIUM 7.8* 6.8* 7.2* 7.7* 7.5*   Liver Function Tests:  Recent Labs Lab 08/04/14 0412 08/08/14 1338 08/09/14 0442  AST 34 124* 187*  ALT 39 71* 100*  ALKPHOS 69 57 58  BILITOT 0.9 0.6 1.0  PROT 7.0 5.2* 4.5*  ALBUMIN 3.6 2.3* 2.1*    Recent Labs Lab 08/04/14 0412  LIPASE 59   No results for input(s): AMMONIA in the last 168 hours. CBC:  Recent Labs Lab 08/04/14 0412  08/06/14 0500 08/07/14 0610 08/08/14 1338 08/09/14 0442 08/09/14 0854  WBC 11.6*  < > 10.6* 8.0 7.3 6.8 7.7  NEUTROABS 5.8  --   --   --   --   --   --   HGB 14.7  < > 13.8 13.7 13.9 13.4 13.3  HCT 46.9  < > 44.3 42.7 43.3 41.5 41.4  MCV 92.0  < > 94.7 93.8 91.0 92.8 91.2  PLT 177  < > 195 233 257 242 244  < > = values in this interval not displayed. Cardiac Enzymes: No results for input(s): CKTOTAL, CKMB, CKMBINDEX, TROPONINI in the last 168 hours. BNP: Invalid input(s): POCBNP CBG:  Recent Labs Lab 08/08/14 1921 08/09/14 0447 08/09/14 0821  GLUCAP 86 91 76    Recent Results (from the past 240  hour(s))  MRSA PCR Screening     Status: None   Collection Time: 08/08/14  5:21 PM  Result Value Ref Range Status   MRSA by PCR NEGATIVE NEGATIVE Final    Comment:        The GeneXpert MRSA Assay (FDA approved for NASAL specimens only), is one component of a comprehensive MRSA colonization surveillance program. It is not intended to diagnose MRSA infection nor to guide or monitor treatment for MRSA infections.      Scheduled Meds: . allopurinol  300 mg Oral Daily  . alteplase (tPA / ACTIVASE) PE Lysis 24 mg/500 mL (UNILATERAL)  0.5 mg/hr Intravenous Once  . aspirin  81 mg Oral Daily  .  ceFAZolin (ANCEF) IV  2 g Intravenous 60 min Pre-Op  . ciprofloxacin  400 mg Intravenous Q12H  . famotidine (PEPCID) IV  20 mg Intravenous Q12H  . HYDROmorphone PCA 0.3 mg/mL   Intravenous 6 times per day  . metoprolol succinate  50 mg Oral Daily  . metronidazole  500 mg Intravenous Q8H  . omega-3 acid ethyl esters  1 g Oral Daily  . sodium chloride  3 mL Intravenous Q12H  . sodium chloride  3 mL Intravenous Q12H   Continuous Infusions: . sodium chloride 125 mL/hr at 08/09/14 0036  . sodium chloride 35 mL/hr at 08/09/14 0859  . heparin 2,600 Units/hr (08/09/14 0607)     Renai Lopata, DO  [facility] Pager [phone]  If 7PM-7AM, please contact night-coverage [email or web address] Password [email or web address] 08/09/2014, 11:23 AM   LOS: 5 days

## 2014-08-09 NOTE — Sedation Documentation (Addendum)
Kelton Pillar, RN from [facility]M came down w/ pt who is on monitor, Dr. Anselm Pancoast in room, Heparin gtt stopped and lines disconnected from sheath and infusion catheter at side per MD order.  Dilaudid PCA not in use or on. Pt moved to IR table w/ SOB noted, placed to [facility].  Report received from nurse.

## 2014-08-09 NOTE — Procedures (Signed)
Follow-up thrombolysis of SMV and portal vein clot.  Angiogram images were obtained of the mesenteric veins and portal system.  There is flow in the SMV but there continues to be a large amount of thrombus remaining.  As a result, we will continue catheter-directed thrombolytic therapy for another day.  Continue TPA at 0.5 mg/hr.  Continue IV heparin.  The patient is feeling better but fluoroscopy demonstrates very dilated loops of small bowel.  There may be an obstructive component which was suggested on recent CT.

## 2014-08-09 NOTE — Sedation Documentation (Signed)
SOB resolved, pt denied any pain.

## 2014-08-09 NOTE — Consult Note (Signed)
PULMONARY / CRITICAL CARE MEDICINE   Name: Dylan [name] MRN: [id number] DOB: [birth year]    ADMISSION DATE:  08/04/2014 CONSULTATION DATE:  08/09/2014  REFERRING MD :  Tat  CHIEF COMPLAINT:  Abd pain  INITIAL PRESENTATION:  49 y.o. M with 4 days diffuse abd pain, found to have small bowel enteritis along with occlusive thrombus in SMV extending through main portal vein.  Underwent lysis by IR on 08/08/14 and returned to ICU on tPA infusion for 24 hours.    STUDIES:  CT Abd 01/01 >>> several loops of abnormal enlarged thick walled small bowel loops with surrounding inflammation and fluid in RLQ. US Renal 01/02 >>> prior small bowel wall thickening in RLQ is attributed to likely SMV thrombosis.  Small amt of ascites adjacent to the right hepatic lobe. Complex lesion of left mid kidney. MRI 01/02 >>> Occlusive thrombus in SMV extending into main portal vein.  Abdominal ascites, left renal cyst. CT abd 01/04 >>> persistent small bowel thickening. Increased small bowel dilatation suspicious for SBO, increase in diffuse mesenteric edema and ascites.   SIGNIFICANT EVENTS: 01/01 - admit 01/05 - lysis by IR, placed on tPA infusion 01/06 - PCCM consulted to take over as primary team   HISTORY OF PRESENT ILLNESS:  Dylan [name] is a 49 y.o. M who presented with 4 day hx of diffuse abd pain, decreased PO intake, nausea, vomiting, diarrhea.   CT and MRI were obtained and revealed enteritis as well as SMV thrombus.  He was placed on heparin and abx.  He was evaluated by [facility] who recommended supportive care. On 01/05, pt was seen by IR and underwent lysis of thrombus with reported good response.  He was placed on tPA for planned 24 hours.  On 01/06, PCCM was asked to take over as primary team. Note, he is to return to IR 01/06 for repeat evaluation of thrombus.  Since procedure on 01/05, he has had a bowel movement.  He denies any nausea/vomiting, reports improvement in pain.  Tolerating ice  chips well.  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 (04/19/2013).  has past surgical history that includes Septoplasty (2001); Coronary angioplasty with stent (07/2010); Coronary angioplasty (07/06/2012); Cardiac catheterization (04/18/2013); left heart catheterization with coronary angiogram (N/A, 06/11/2012); percutaneous coronary stent intervention (pci-s) (N/A, 07/06/2012); and left heart catheterization with coronary angiogram (N/A, 04/18/2013). Prior to Admission medications   Medication Sig Start Date End Date Taking? Authorizing Provider  allopurinol (ZYLOPRIM) 300 MG tablet Take 1 tablet by mouth daily. 07/03/14  Yes Historical Provider, MD  aspirin 81 MG chewable tablet Chew 1 tablet (81 mg total) by mouth daily. 07/07/12  Yes Luke K Kilroy, PA-C  azithromycin (ZITHROMAX) 500 MG tablet See admin instructions. 3 tabs on day 1, 2 tabs daily on days 2 and 3 07/25/14  Yes Historical Provider, MD  cefdinir (OMNICEF) 300 MG capsule Take 1 capsule by mouth 2 (two) times daily. 07/29/14  Yes Historical Provider, MD  furosemide (LASIX) 20 MG tablet Take 20 mg by mouth daily.   Yes Historical Provider, MD  Liraglutide -Weight Management (SAXENDA) 18 MG/3ML SOPN Inject 3 mLs into the skin daily.  Yes Historical Provider, MD  metoprolol succinate (TOPROL-XL) 50 MG 24 hr tablet Take 50 mg by mouth daily. Take with or immediately following a meal.   Yes Historical Provider, MD  olmesartan-hydrochlorothiazide (BENICAR HCT) 40-25 MG per tablet Take 1 tablet by mouth daily.   Yes Historical Provider, MD  omega-3 acid  ethyl esters (LOVAZA) 1 G capsule Take 1 capsule (1 g total) by mouth daily. <please make appointment>   Yes Brittainy M Simmons, PA-C  predniSONE (STERAPRED UNI-PAK) 5 MG TABS tablet See admin instructions. 6 tabs on the first day, decreasing by 1 tab daily until gone 07/29/14  Yes Historical Provider, MD  testosterone cypionate (DEPOTESTOTERONE CYPIONATE) 200 MG/ML injection Inject 1 mL into the muscle once a week. Wednesday 06/07/14  Yes Historical Provider, MD  acetaminophen (TYLENOL) 325 MG tablet Take 2 tablets (650 mg total) by mouth every 4 (four) hours as needed. 04/19/13   Erlene Quan, PA-C  fenofibrate 160 MG tablet Take 1 tablet (160 mg total) by mouth daily. Patient not taking: Reported on 08/04/2014 04/18/13   Brittainy M Rosita Fire, PA-C  metoprolol succinate (TOPROL-XL) 25 MG 24 hr tablet Take 1 tablet (25 mg total) by mouth daily. <please make appointment> Patient not taking: Reported on 08/04/2014    Erlene Quan, PA-C  nitroGLYCERIN (NITROSTAT) 0.4 MG SL tablet Place 1 tablet (0.4 mg total) under the tongue every 5 (five) minutes x 3 doses as needed for chest pain. 07/07/12   Erlene Quan, PA-C   Allergies  Allergen Reactions  . Ace Inhibitors Cough  . Lipitor [Atorvastatin] Other (See Comments)    myalgia  . Codeine Itching    FAMILY HISTORY:  Family History  Problem Relation Age of Onset  . Atrial fibrillation Mother   . Mitral valve prolapse Mother   . Coronary artery disease Father     CABG, aortic aneursym,    SOCIAL HISTORY:  reports that he quit smoking about 4 years ago. His smoking use included Cigarettes. He has a 15 pack-year smoking history. He quit smokeless tobacco use about 4 years ago. His smokeless tobacco use included Chew. He reports that he drinks about 16.8 oz of alcohol per week. He reports that he does not use illicit drugs.  REVIEW OF SYSTEMS:   All negative; except for those that are bolded, which indicate positives.  Constitutional: weight loss,  weight gain, night sweats, fevers, chills, fatigue, weakness.  HEENT: headaches, sore throat, sneezing, nasal congestion, post nasal drip, difficulty swallowing, tooth/dental problems, visual complaints, visual changes, ear aches. Neuro: difficulty with speech, weakness, numbness, ataxia. CV:  chest pain, orthopnea, PND, swelling in lower extremities, dizziness, palpitations, syncope.  Resp: cough, hemoptysis, dyspnea, wheezing. GI  heartburn, indigestion, mild abdominal pain, nausea, vomiting, diarrhea, constipation, change in bowel habits, loss of appetite, hematemesis, melena, hematochezia.  GU: dysuria, change in color of urine, urgency or frequency, flank pain, hematuria. MSK: joint pain or swelling, decreased range of motion. Psych: change in mood or affect, depression, anxiety, suicidal ideations, homicidal ideations. Skin: rash, itching, bruising.   SUBJECTIVE:  Pain much improved.  Tolerating ice chips.  VITAL SIGNS: Temp:  [98.3 F (36.8 C)-99.2 F (37.3 C)] 98.4 F (36.9 C) (01/06 1203) Pulse Rate:  [79-99] 99 (01/06 0830) Resp:  [8-28] 23 (01/06 0830) BP: (103-153)/(68-110) 125/78 mmHg (01/06 0830) SpO2:  [91 %-99 %] 93 % (01/06 0830) HEMODYNAMICS:   VENTILATOR SETTINGS:   INTAKE / OUTPUT: Intake/Output      01/05 0701 -  01/06 0700 01/06 0701 - 01/07 0700   P.O. 0    I.V. (mL/kg) 3351.3 (24) 161.4 (1.2)   IV Piggyback 950    Total Intake(mL/kg) 4301.3 (30.8) 161.4 (1.2)   Urine (mL/kg/hr) 2100 (0.6)    Total Output 2100     Net +2201.3 +161.4        Urine Occurrence 2 x    Stool Occurrence 1 x      PHYSICAL EXAMINATION: General: WDWN male, in NAD. Neuro: A&O x 3, non-focal.  HEENT: [place]/AT. PERRL, sclerae anicteric. Cardiovascular: RRR, no M/R/G.  Lungs: Respirations even and unlabored.  CTA bilaterally, No W/R/R.  Abdomen: BS x 4, soft, NT/ND.  tPA catheter in RUQ. Musculoskeletal: No gross deformities, 2+ pitting edema. Skin: Intact, warm, no  rashes.  LABS:  CBC  Recent Labs Lab 08/08/14 1338 08/09/14 0442 08/09/14 0854  WBC 7.3 6.8 7.7  HGB 13.9 13.4 13.3  HCT 43.3 41.5 41.4  PLT 257 242 244   Coag's  Recent Labs Lab 08/05/14 1312  APTT 28  INR 1.21   BMET  Recent Labs Lab 08/07/14 0610 08/08/14 1338 08/09/14 0442  NA 132* 134* 137  K 4.4 4.1 4.2  CL 99 104 107  CO2 30 23 24   BUN 35* 24* 23  CREATININE 1.54* 1.42* 1.31  GLUCOSE 122* 106* 91   Electrolytes  Recent Labs Lab 08/07/14 0610 08/08/14 1338 08/09/14 0442  CALCIUM 7.2* 7.7* 7.5*   Sepsis Markers  Recent Labs Lab 08/07/14 0610 08/08/14 1401 08/09/14 0429  LATICACIDVEN 1.0 1.2 1.1   ABG No results for input(s): PHART, PCO2ART, PO2ART in the last 168 hours. Liver Enzymes  Recent Labs Lab 08/04/14 0412 08/08/14 1338 08/09/14 0442  AST 34 124* 187*  ALT 39 71* 100*  ALKPHOS 69 57 58  BILITOT 0.9 0.6 1.0  ALBUMIN 3.6 2.3* 2.1*   Cardiac Enzymes No results for input(s): TROPONINI, PROBNP in the last 168 hours. Glucose  Recent Labs Lab 08/08/14 1921 08/09/14 0447 08/09/14 0821 08/09/14 1159  GLUCAP 86 91 76 102*    Imaging Ir Angiogram Selective Each Additional Vessel  08/08/2014   CLINICAL DATA:  Superior mesenteric vein and portal vein DVT with bowel ischemia.  EXAM: IR ULTRASOUND GUIDANCE VASC ACCESS RIGHT; THROMBOECTOMY MECHANICAL VENOUS; IR INFUSION THROMBOL VENOUS INITIAL (MS); PORTAL VENOGRAPHY; ADDITIONAL ARTERIOGRAPHY  FLUOROSCOPY TIME:  8 min and 6 seconds.  MEDICATIONS AND MEDICAL HISTORY: Versed 4 mg, Fentanyl 175 mcg.  As antibiotic prophylaxis, Ancef was ordered pre-procedure and administered intravenously within one hour of incision.  ANESTHESIA/SEDATION: Moderate sedation time: 52 minutes  CONTRAST:  50 cc Omnipaque 300.  Carbon dioxide.  PROCEDURE: The procedure, risks, benefits, and alternatives were explained to the patient. Questions regarding the procedure were encouraged and answered. The patient  understands and consents to the procedure.  The right flank was prepped with Betadine in a sterile fashion, and a sterile drape was applied covering the operative field. A sterile gown and sterile gloves were used for the procedure.  1% lidocaine was utilized for local infiltration. Under sonographic guidance, a 22 gauge Chiba needle was inserted into the peripheral aspect of the main right portal vein. Contrast was injected confirming needle position. The needle was removed over a 018 wire. The Accustick dilator was inserted. A small amount of contrast was injected. Carbon dioxide portal venography was performed.  The Accustick dilator was exchanged over the 3 J for a 5 [place] Kumpe the catheter. This was advanced into the  lower portal vein and venography was performed. The catheter was advanced into the upper SMV and contrast was injected. There is partially occlusive thrombus in the main portal vein and occlusive thrombus in the SMV.  The catheter was carefully advanced over a glidewire to the small venous branches of the distal ileum in the right lower quadrant. Contrast was injected demonstrating stasis of flow within the small branches adjacent to the intestines and complete occlusion within the larger more central branches.  The Kumpe the catheter was removed over a Rosen wire. A 6 French sheath was advanced over the wire to the upper main portal vein. The Angiojet was utilized to mechanically lysed the thrombus for 96 seconds.  Subsequently, a 90 cm 20 cm infusion length 5 French multi side-hole catheter was advanced over the [place] wire to the right lower quadrant. The catheter is positioned along side the thrombus extending from the main portal vein to the ileum. Lysis with 0.5 mg tPA was then instituted. Heparin protocol was resumed per peripheral access.  FINDINGS: Portal venography confirms partial occlusion of the main portal vein and complete occlusion of the SMV. Splenic vein is patent.  Subsequent  venogram images confirmed occlusion of mesenteric brain branches to the right lower quadrant. Peripheral branches adjacent to the intestine are patent with stasis of flow.  The neck series of images demonstrate placement of a 6 French sheath and multi side hole catheter along side the thrombus, extending from the main portal vein to the small mesenteric venous branches in the right lower quadrant.  COMPLICATIONS: None  IMPRESSION: Successful mechanical and pharmacological lysis of thrombus in the main portal vein, superior mesenteric vein, and venous branches in the right lower quadrant. TPA had 0.5 milligrams/hour will be instituted via multi side-hole catheter along side the thrombus.   Electronically Signed   By: Maryclare Bean M.D.   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  EXAM: IR ULTRASOUND GUIDANCE VASC ACCESS RIGHT; THROMBOECTOMY MECHANICAL VENOUS; IR INFUSION THROMBOL VENOUS INITIAL (MS); PORTAL VENOGRAPHY; ADDITIONAL ARTERIOGRAPHY  FLUOROSCOPY TIME:  8 min and 6 seconds.  MEDICATIONS AND MEDICAL HISTORY: Versed 4 mg, Fentanyl 175 mcg.  As antibiotic prophylaxis, Ancef was ordered pre-procedure and administered intravenously within one hour of incision.  ANESTHESIA/SEDATION: Moderate sedation time: 52 minutes  CONTRAST:  50 cc Omnipaque 300.  Carbon dioxide.  PROCEDURE: The procedure, risks, benefits, and alternatives were explained to the patient. Questions regarding the procedure were encouraged and answered. The patient understands and consents to the procedure.  The right flank was prepped with Betadine in a sterile fashion, and a sterile drape was applied covering the operative field. A sterile gown and sterile gloves were used for the procedure.  1% lidocaine was utilized for local infiltration. Under sonographic guidance, a 22 gauge Chiba needle was inserted into the peripheral aspect of the  main right portal vein. Contrast was injected confirming needle position. The needle was removed over a 018 wire. The Accustick dilator was inserted. A small amount of contrast was injected. Carbon dioxide portal venography was performed.  The Accustick dilator was exchanged over the 3 J for a 5 [place] Kumpe the catheter. This was advanced into the lower portal vein and venography was performed. The catheter was advanced into the upper SMV and contrast was injected. There is partially occlusive thrombus in the main portal vein and occlusive thrombus  in the SMV.  The catheter was carefully advanced over a glidewire to the small venous branches of the distal ileum in the right lower quadrant. Contrast was injected demonstrating stasis of flow within the small branches adjacent to the intestines and complete occlusion within the larger more central branches.  The Kumpe the catheter was removed over a Rosen wire. A 6 French sheath was advanced over the wire to the upper main portal vein. The Angiojet was utilized to mechanically lysed the thrombus for 96 seconds.  Subsequently, a 90 cm 20 cm infusion length 5 French multi side-hole catheter was advanced over the [place] wire to the right lower quadrant. The catheter is positioned along side the thrombus extending from the main portal vein to the ileum. Lysis with 0.5 mg tPA was then instituted. Heparin protocol was resumed per peripheral access.  FINDINGS: Portal venography confirms partial occlusion of the main portal vein and complete occlusion of the SMV. Splenic vein is patent.  Subsequent venogram images confirmed occlusion of mesenteric brain branches to the right lower quadrant. Peripheral branches adjacent to the intestine are patent with stasis of flow.  The neck series of images demonstrate placement of a 6 French sheath and multi side hole catheter along side the thrombus, extending from the main portal vein to the small mesenteric venous branches in the right  lower quadrant.  COMPLICATIONS: None  IMPRESSION: Successful mechanical and pharmacological lysis of thrombus in the main portal vein, superior mesenteric vein, and venous branches in the right lower quadrant. TPA had 0.5 milligrams/hour will be instituted via multi side-hole catheter along side the thrombus.   Electronically Signed   By: Maryclare Bean M.D.   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  EXAM: IR ULTRASOUND GUIDANCE VASC ACCESS RIGHT; THROMBOECTOMY MECHANICAL VENOUS; IR INFUSION THROMBOL VENOUS INITIAL (MS); PORTAL VENOGRAPHY; ADDITIONAL ARTERIOGRAPHY  FLUOROSCOPY TIME:  8 min and 6 seconds.  MEDICATIONS AND MEDICAL HISTORY: Versed 4 mg, Fentanyl 175 mcg.  As antibiotic prophylaxis, Ancef was ordered pre-procedure and administered intravenously within one hour of incision.  ANESTHESIA/SEDATION: Moderate sedation time: 52 minutes  CONTRAST:  50 cc Omnipaque 300.  Carbon dioxide.  PROCEDURE: The procedure, risks, benefits, and alternatives were explained to the patient. Questions regarding the procedure were encouraged and answered. The patient understands and consents to the procedure.  The right flank was prepped with Betadine in a sterile fashion, and a sterile drape was applied covering the operative field. A sterile gown and sterile gloves were used for the procedure.  1% lidocaine was utilized for local infiltration. Under sonographic guidance, a 22 gauge Chiba needle was inserted into the peripheral aspect of the main right portal vein. Contrast was injected confirming needle position. The needle was removed over a 018 wire. The Accustick dilator was inserted. A small amount of contrast was injected. Carbon dioxide portal venography was performed.  The Accustick dilator was exchanged over the 3 J for a 5 [place] Kumpe the catheter. This was advanced into the lower portal vein and venography was  performed. The catheter was advanced into the upper SMV and contrast was injected. There is partially occlusive thrombus in the main portal vein and occlusive thrombus in the SMV.  The catheter was carefully advanced over a glidewire to the small venous branches of the distal ileum in the right lower quadrant. Contrast was injected demonstrating stasis  of flow within the small branches adjacent to the intestines and complete occlusion within the larger more central branches.  The Kumpe the catheter was removed over a Rosen wire. A 6 French sheath was advanced over the wire to the upper main portal vein. The Angiojet was utilized to mechanically lysed the thrombus for 96 seconds.  Subsequently, a 90 cm 20 cm infusion length 5 French multi side-hole catheter was advanced over the [place] wire to the right lower quadrant. The catheter is positioned along side the thrombus extending from the main portal vein to the ileum. Lysis with 0.5 mg tPA was then instituted. Heparin protocol was resumed per peripheral access.  FINDINGS: Portal venography confirms partial occlusion of the main portal vein and complete occlusion of the SMV. Splenic vein is patent.  Subsequent venogram images confirmed occlusion of mesenteric brain branches to the right lower quadrant. Peripheral branches adjacent to the intestine are patent with stasis of flow.  The neck series of images demonstrate placement of a 6 French sheath and multi side hole catheter along side the thrombus, extending from the main portal vein to the small mesenteric venous branches in the right lower quadrant.  COMPLICATIONS: None  IMPRESSION: Successful mechanical and pharmacological lysis of thrombus in the main portal vein, superior mesenteric vein, and venous branches in the right lower quadrant. TPA had 0.5 milligrams/hour will be instituted via multi side-hole catheter along side the thrombus.   Electronically Signed   By: Maryclare Bean M.D.   On: 08/08/2014 17:21   Ir US  Guide Vasc Access Right  08/08/2014   CLINICAL DATA:  Superior mesenteric vein and portal vein DVT with bowel ischemia.  EXAM: IR ULTRASOUND GUIDANCE VASC ACCESS RIGHT; THROMBOECTOMY MECHANICAL VENOUS; IR INFUSION THROMBOL VENOUS INITIAL (MS); PORTAL VENOGRAPHY; ADDITIONAL ARTERIOGRAPHY  FLUOROSCOPY TIME:  8 min and 6 seconds.  MEDICATIONS AND MEDICAL HISTORY: Versed 4 mg, Fentanyl 175 mcg.  As antibiotic prophylaxis, Ancef was ordered pre-procedure and administered intravenously within one hour of incision.  ANESTHESIA/SEDATION: Moderate sedation time: 52 minutes  CONTRAST:  50 cc Omnipaque 300.  Carbon dioxide.  PROCEDURE: The procedure, risks, benefits, and alternatives were explained to the patient. Questions regarding the procedure were encouraged and answered. The patient understands and consents to the procedure.  The right flank was prepped with Betadine in a sterile fashion, and a sterile drape was applied covering the operative field. A sterile gown and sterile gloves were used for the procedure.  1% lidocaine was utilized for local infiltration. Under sonographic guidance, a 22 gauge Chiba needle was inserted into the peripheral aspect of the main right portal vein. Contrast was injected confirming needle position. The needle was removed over a 018 wire. The Accustick dilator was inserted. A small amount of contrast was injected. Carbon dioxide portal venography was performed.  The Accustick dilator was exchanged over the 3 J for a 5 [place] Kumpe the catheter. This was advanced into the lower portal vein and venography was performed. The catheter was advanced into the upper SMV and contrast was injected. There is partially occlusive thrombus in the main portal vein and occlusive thrombus in the SMV.  The catheter was carefully advanced over a glidewire to the small venous branches of the distal ileum in the right lower quadrant. Contrast was injected demonstrating stasis of flow within the small branches  adjacent to the intestines and complete occlusion within the larger more central branches.  The Kumpe the catheter was removed over a [place] wire. A  6 French sheath was advanced over the wire to the upper main portal vein. The Angiojet was utilized to mechanically lysed the thrombus for 96 seconds.  Subsequently, a 90 cm 20 cm infusion length 5 French multi side-hole catheter was advanced over the [place] wire to the right lower quadrant. The catheter is positioned along side the thrombus extending from the main portal vein to the ileum. Lysis with 0.5 mg tPA was then instituted. Heparin protocol was resumed per peripheral access.  FINDINGS: Portal venography confirms partial occlusion of the main portal vein and complete occlusion of the SMV. Splenic vein is patent.  Subsequent venogram images confirmed occlusion of mesenteric brain branches to the right lower quadrant. Peripheral branches adjacent to the intestine are patent with stasis of flow.  The neck series of images demonstrate placement of a 6 French sheath and multi side hole catheter along side the thrombus, extending from the main portal vein to the small mesenteric venous branches in the right lower quadrant.  COMPLICATIONS: None  IMPRESSION: Successful mechanical and pharmacological lysis of thrombus in the main portal vein, superior mesenteric vein, and venous branches in the right lower quadrant. TPA had 0.5 milligrams/hour will be instituted via multi side-hole catheter along side the thrombus.   Electronically Signed   By: Maryclare Bean M.D.   On: 08/08/2014 17:21   Ir Infusion Thrombol Venous Initial (ms)  08/08/2014   CLINICAL DATA:  Superior mesenteric vein and portal vein DVT with bowel ischemia.  EXAM: IR ULTRASOUND GUIDANCE VASC ACCESS RIGHT; THROMBOECTOMY MECHANICAL VENOUS; IR INFUSION THROMBOL VENOUS INITIAL (MS); PORTAL VENOGRAPHY; ADDITIONAL ARTERIOGRAPHY  FLUOROSCOPY TIME:  8 min and 6 seconds.  MEDICATIONS AND MEDICAL HISTORY: Versed 4 mg,  Fentanyl 175 mcg.  As antibiotic prophylaxis, Ancef was ordered pre-procedure and administered intravenously within one hour of incision.  ANESTHESIA/SEDATION: Moderate sedation time: 52 minutes  CONTRAST:  50 cc Omnipaque 300.  Carbon dioxide.  PROCEDURE: The procedure, risks, benefits, and alternatives were explained to the patient. Questions regarding the procedure were encouraged and answered. The patient understands and consents to the procedure.  The right flank was prepped with Betadine in a sterile fashion, and a sterile drape was applied covering the operative field. A sterile gown and sterile gloves were used for the procedure.  1% lidocaine was utilized for local infiltration. Under sonographic guidance, a 22 gauge Chiba needle was inserted into the peripheral aspect of the main right portal vein. Contrast was injected confirming needle position. The needle was removed over a 018 wire. The Accustick dilator was inserted. A small amount of contrast was injected. Carbon dioxide portal venography was performed.  The Accustick dilator was exchanged over the 3 J for a 5 [place] Kumpe the catheter. This was advanced into the lower portal vein and venography was performed. The catheter was advanced into the upper SMV and contrast was injected. There is partially occlusive thrombus in the main portal vein and occlusive thrombus in the SMV.  The catheter was carefully advanced over a glidewire to the small venous branches of the distal ileum in the right lower quadrant. Contrast was injected demonstrating stasis of flow within the small branches adjacent to the intestines and complete occlusion within the larger more central branches.  The Kumpe the catheter was removed over a Rosen wire. A 6 French sheath was advanced over the wire to the upper main portal vein. The Angiojet was utilized to mechanically lysed the thrombus for 96 seconds.  Subsequently, a 90 cm 20  cm infusion length 5 French multi side-hole  catheter was advanced over the [place] wire to the right lower quadrant. The catheter is positioned along side the thrombus extending from the main portal vein to the ileum. Lysis with 0.5 mg tPA was then instituted. Heparin protocol was resumed per peripheral access.  FINDINGS: Portal venography confirms partial occlusion of the main portal vein and complete occlusion of the SMV. Splenic vein is patent.  Subsequent venogram images confirmed occlusion of mesenteric brain branches to the right lower quadrant. Peripheral branches adjacent to the intestine are patent with stasis of flow.  The neck series of images demonstrate placement of a 6 French sheath and multi side hole catheter along side the thrombus, extending from the main portal vein to the small mesenteric venous branches in the right lower quadrant.  COMPLICATIONS: None  IMPRESSION: Successful mechanical and pharmacological lysis of thrombus in the main portal vein, superior mesenteric vein, and venous branches in the right lower quadrant. TPA had 0.5 milligrams/hour will be instituted via multi side-hole catheter along side the thrombus.   Electronically Signed   By: Maryclare Bean M.D.   On: 08/08/2014 17:21     ASSESSMENT / PLAN:  PULMONARY A: No acute issues P:   No interventions required.  CARDIOVASCULAR A:  CAD s/p PCI 2013 Hx HTN, HLD P:  Continue aspirin, beta blocker.  RENAL A:   AKI - resolving P:   NS @ 125. CMP in AM.  GASTROINTESTINAL A:   SBO Small bowel enteritis Transaminitis - likely due to ETOH as well as portal vein thrombosis Nutrition P:   CCS following. Hep panel sent. Trend LFT's. Pepcid. Ice chips, advance per CCS recs.   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 Hematology following. SCD's. CBC in AM.  INFECTIOUS A:   Small bowel enteritis P:   Cipro, start date 1/1, day 6/x. Flagyl, start date 1/2, day  6/x.  ENDOCRINE A:   No known issues P:   No interventions required.  NEUROLOGIC A:   Pain Hx CVA s/p cardiac cath 2011 P:   Continue Dilaudid PCA.  Back to IR this PM 01/06 for reassessment of PV / SMV thrombus.  Only in ICU due to being on tPA.  No evidence of any bleeding, CBC has remained stable since procedure.  Assuming remains stable, can discharge from ICU once tPA has stopped.  Family updated: Wife.  Interdisciplinary Family Meeting v Palliative Care Meeting:  N/A.   Montey Hora, [facility] Pgr: [phone]  or [phone] 08/09/2014, 2:20 PM

## 2014-08-09 NOTE — Progress Notes (Signed)
ANTICOAGULATION CONSULT NOTE - Follow Up Consult  Pharmacy Consult for heparin Indication: mesenteric vein thrombosis  Allergies  Allergen Reactions  . Ace Inhibitors Cough  . Lipitor [Atorvastatin] Other (See Comments)    myalgia  . Codeine Itching    Patient Measurements: Height: 6\' 2"  (188 cm) Weight: (!) 307 lb 12.2 oz (139.6 kg) IBW/kg (Calculated) : 82.2 Heparin Dosing Weight: 113 kg  Vital Signs: Temp: 98.4 F (36.9 C) (01/06 1203) Temp Source: Oral (01/06 1203) BP: 125/78 mmHg (01/06 0830) Pulse Rate: 99 (01/06 0830)  Labs:  Recent Labs  08/07/14 0610  08/08/14 1338 08/08/14 2110 08/09/14 0442 08/09/14 0854  HGB 13.7  --  13.9  --  13.4 13.3  HCT 42.7  --  43.3  --  41.5 41.4  PLT 233  --  257  --  242 244  HEPARINUNFRC 0.37  < >  --  0.53 0.61 0.66  CREATININE 1.54*  --  1.42*  --  1.31  --   < > = values in this interval not displayed.  Estimated Creatinine Clearance: 101.5 mL/min (by C-G formula based on Cr of 1.31).   Assessment: 49 yr old male with mesenteric thrombosis and extensive DVT from small ileacolic branches into the MPV s/p lysis 1/5 -continues on IV heparin and IV TPA. 24 hr TPA should end today ~1630 and then sheath to be removed. Heparin level remains supra-therapeutic at 0.66 on 2600 units/hr. H/H and Plt wnl. No bleeding per RN.   Goal of Therapy:  Heparin level 0.3-0.5 units/ml (while on TPA) Monitor platelets by anticoagulation protocol: Yes   Plan:  -Decrease heparin to 2450 units/hr -F/u post sheath removal in IR ~1630 -plan to hold heparin ~4hrs around sheath removal   Sherlon Handing, PharmD, BCPS Clinical pharmacist, pager [phone] 08/09/2014 1:44 PM

## 2014-08-09 NOTE — Progress Notes (Signed)
Reported Fibrinogen level to Dr. Anselm Pancoast at [phone]

## 2014-08-09 NOTE — Consult Note (Signed)
INPATIENT HEMATOLOGY- ONCOLOGY CONSULTATION  Patient Care Team: Imran Gertie Baron, MD as PCP - General (Internal Medicine)  REASON FOR CONSULT: Deep Venous Thrombosis (DVT) I have seen the patient, examined him and edited the notes as follows  Consulting Physician: Heath Lark, MD  HISTORY OF PRESENTING ILLNESS:   Dylan [name] 49 y.o. male admitted with severe, sharp, 4 day history of diffuse abdominal pain, decreased oral intake, nausea, non-bloody vomiting and diarrhea. This event was preceded by an upper respiratory infection 5 days earlier treated with azithromycin and prednisone. He denies shortness of breath, chest pain, palpitations, or dizziness. He denies any motor or sensory deficits. He denies any bleeding issues such as hemoptysis, epistaxis, hematemesis, or hematuria. He was afebrile.   A CT of the abdomen  and pelvis with contrast on 1/1 showed findings consistent with small bowel enteritis. MRI of the abdomen on 1/2 revealed  occlusive thrombus in superior mesenteric vein extending through the main portal vein, as well as abdominal ascites.  He admits to alcohol habituation, denies tobacco use since 2011 when he had a heart attack. He denies the use of recreational drugs. He reports a prior history of CVA in 2011, hypercholesterolemia, a remote history of pancreatitis and prior history of myocardial infarction. He denies a prior history of blood clot. He takes testosterone cypionate injections since 06/07/14.  No recent long distance trip. He is very active, walks about 6 miles daily. He reports bilateral lower extremity swelling without tenderness or erythema. He denies any recent PICC line placement other than the one performed on admission. He denies any recent surgery. He denies any  prior history or diagnosis of cancer. His screening programs are up-to-date.   He was placed on heparin in addition to supportive measures for the treatment of colitis with cipro and flagyl, pain  meds and IV hydration. A repeat CT of the abdomen on 1/4 showed persistent small bowel wall thickening in the right abdomen, suspicious for bowel ischemia in the setting of SMV thrombosis, and increased proximal small bowel dilatation, suspicious for small bowel obstruction. Ascites progressed.  He underwent transhepatic superior mesenteric vein / portal vein SMV/PV) lysis by interventional radiology on 08/08/14 with good response. He is currently on tPA and heparin. Surgical team recommended supportive care, no surgical intervention was indicated at this time as symptoms are improving. Hematology has been requested to see patient with recommendations. He has strong family history of blood clots in both his parents.  MEDICAL HISTORY:  Past Medical History  Diagnosis Date  . Hypertension   . Coronary artery disease   . S/P angioplasty with stent (angiosculpt) of RCA for "in stent restenosis" 07/06/2012  . Hypercholesteremia   . Anginal pain   . NSTEMI (non-ST elevated myocardial infarction) 07/2010    r/t total occlusion of RCA (3 Taxus Ion DES)  . Stroke 2011    S/P cardiac cath - embolic stroke; denies residual (07/06/2012)  . Migraines     "maybe one/yr" (07/06/2012)  . 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 04/19/2013    No evidence of CVA on MRI/MRA & Dliated Eye Examination. ruptured blood vessel & not occlusion.  History of pancreatitis 2003  SURGICAL HISTORY: Past Surgical History  Procedure Laterality Date  . Septoplasty  2001  . Coronary angioplasty with stent placement  07/2010    inferior wall MI - 3 Taxus Ion DES (3.0x55mm,  3.0x28mm, 3.5x16mm) to prox and mid RCA  . Coronary angioplasty  07/06/2012    in-stent restenotic lesion in RCA 80%   . Cardiac catheterization  04/18/2013  . Left heart catheterization with coronary angiogram N/A 06/11/2012    Procedure: LEFT HEART  CATHETERIZATION WITH CORONARY ANGIOGRAM;  Surgeon: [name], MD;  Location: [facility] CATH LAB;  Service: Cardiovascular;  Laterality: N/A;  . Percutaneous coronary stent intervention (pci-s) N/A 07/06/2012    Procedure: PERCUTANEOUS CORONARY STENT INTERVENTION (PCI-S);  Surgeon: [name], MD;  Location: [facility] CATH LAB;  Service: Cardiovascular;  Laterality: N/A;  . Left heart catheterization with coronary angiogram N/A 04/18/2013    Procedure: LEFT HEART CATHETERIZATION WITH CORONARY ANGIOGRAM;  Surgeon: [name], MD;  Location: [facility] CATH LAB;  Service: Cardiovascular;  Laterality: N/A;    SOCIAL HISTORY: History   Social History  . Marital Status: Married    Spouse Name: N/A    Number of Children: 2  . Years of Education: 12   Occupational History  .  [employer]   Social History Main Topics  . Smoking status: Former Smoker -- 1.00 packs/day for 15 years    Types: Cigarettes    Quit date: 06/30/2010  . Smokeless tobacco: Former Systems developer    Types: [place] date: 06/30/2010  . Alcohol Use: 16.8 oz/week    28 Shots of liquor per week     Comment: 04/18/2013 "2 drinks/ day w/maybe 2 shots in each drink"  . Drug Use: No  . Sexual Activity: Yes   Other Topics Concern  . Not on file   Social History Narrative    FAMILY HISTORY: Family History  Problem Relation Age of Onset  . Atrial fibrillation Mother   . Mitral valve prolapse Mother   . Coronary artery disease Father     CABG, aortic aneursym,    ALLERGIES:  is allergic to ace inhibitors; lipitor; and codeine.  MEDICATIONS:  Scheduled Meds: . allopurinol  300 mg Oral Daily  . alteplase (tPA / ACTIVASE) PE Lysis 24 mg/500 mL (UNILATERAL)  0.5 mg/hr Intravenous Once  .  ceFAZolin (ANCEF) IV  2 g Intravenous 60 min Pre-Op  . ciprofloxacin  400 mg Intravenous Q12H  . famotidine (PEPCID) IV  20 mg Intravenous Q12H  . HYDROmorphone PCA 0.3 mg/mL   Intravenous 6 times per day  . metoprolol succinate  50 mg Oral Daily  .  metronidazole  500 mg Intravenous Q8H  . omega-3 acid ethyl esters  1 g Oral Daily  . sodium chloride  3 mL Intravenous Q12H  . sodium chloride  3 mL Intravenous Q12H   Continuous Infusions: . sodium chloride 125 mL/hr at 08/09/14 0036  . sodium chloride 35 mL/hr at 08/09/14 0859  . heparin 2,600 Units/hr (08/09/14 0607)   PRN Meds:.sodium chloride, acetaminophen **OR** acetaminophen, diphenhydrAMINE **OR** diphenhydrAMINE, naloxone **AND** sodium chloride, nitroGLYCERIN, ondansetron **OR** ondansetron (ZOFRAN) IV, ondansetron (ZOFRAN) IV, promethazine, sodium chloride   ROS: Constitutional: Denies fevers, chills or abnormal night sweats Eyes: Denies blurriness of vision, double vision, denies watery eyes Ears, nose, mouth, throat, and face: Denies mucositis or sore throat Respiratory: Denies cough, dyspnea or wheezes Cardiovascular: Denies palpitation, chest discomfort or lower extremity swelling Gastrointestinal:  Reported  Nausea and vomiting on admission, diarrhea, now resolving. Denies heartburn. Abdominal pain is now improving. Skin: Denies abnormal skin rashes Lymphatics: Denies new lymphadenopathy or easy bruising Neurological:Denies numbness, tingling or new weaknesses Behavioral/Psych: Mood is stable, no new changes  All other systems were reviewed with the patient and are negative.   Family History:    Family History  Problem Relation Age of Onset  . Atrial fibrillation Mother   . Mitral valve prolapse Mother   . Coronary artery disease Father     CABG, aortic aneursym,    Chron's disease in mother. No family history of  Deep Venous thrombosis (DVT), mesenteric ischemia or Pulmonary Embolism  Social History:  reports that he quit smoking about 4 years ago. His smoking use included Cigarettes. He has a 15 pack-year smoking history. He quit smokeless tobacco use about 4 years ago. His smokeless tobacco use included Chew. He reports that he drinks about 16.8 oz of alcohol  per week. He reports that he does not use illicit drugs. Married, 2 children. Works as an Engineer, technical sales.     Physical Exam   ECOG PERFORMANCE STATUS: 3   Filed Vitals:   08/09/14 0830  BP: 125/78  Pulse: 99  Temp:   Resp: 23   Filed Weights   08/04/14 1010 08/08/14 0013  Weight: 292 lb 8.8 oz (132.7 kg) 307 lb 12.2 oz (139.6 kg)    GENERAL:alert, no distress and comfortable. He is morbidly obese SKIN: skin color, texture, turgor are normal, no rashes or significant lesions EYES: normal, conjunctiva are pink and non-injected, sclera clear OROPHARYNX: no exudate, no erythema and lips, buccal mucosa, and tongue normal  NECK: supple, thyroid normal size, non-tender, without nodularity LYMPH:  no palpable lymphadenopathy in the cervical, axillary or inguinal LUNGS: clear to auscultation and percussion with normal breathing effort HEART: regular rate & rhythm and no murmurs and 1+ bilateral lower extremity edema ABDOMEN:abdomen obese, non-tender and normal bowel sounds. He has significant tenderness in his abdmoen Musculoskeletal: no cyanosis of digits and no clubbing  PSYCH: alert & oriented x 3 with fluent speech NEURO: no focal motor/sensory deficits   LABORATORY DATA:    Recent Labs Lab 08/04/14 0412  08/06/14 0500 08/07/14 0610 08/08/14 1338 08/09/14 0442 08/09/14 0854  WBC 11.6*  < > 10.6* 8.0 7.3 6.8 7.7  HGB 14.7  < > 13.8 13.7 13.9 13.4 13.3  HCT 46.9  < > 44.3 42.7 43.3 41.5 41.4  PLT 177  < > 195 233 257 242 244  MCV 92.0  < > 94.7 93.8 91.0 92.8 91.2  MCH 28.8  < > 29.5 30.1 29.2 30.0 29.3  MCHC 31.3  < > 31.2 32.1 32.1 32.3 32.1  RDW 14.6  < > 15.0 14.8 14.7 14.9 14.7  LYMPHSABS 4.7*  --   --   --   --   --   --   MONOABS 0.9  --   --   --   --   --   --   EOSABS 0.1  --   --   --   --   --   --   BASOSABS 0.1  --   --   --   --   --   --   < > = values in this interval not displayed.  Chemistries   Recent Labs Lab 08/05/14 0519  08/06/14 0533 08/07/14 0610 08/08/14 1338 08/09/14 0442  NA 138 133* 132* 134* 137  K 4.7 4.7 4.4 4.1 4.2  CL 98 99 99 104 107  CO2 32 28 30 23 24   GLUCOSE 128* 126* 122* 106* 91  BUN 52* 52* 35* 24* 23  CREATININE 2.66* 2.00* 1.54* 1.42* 1.31  CALCIUM 7.8*  6.8* 7.2* 7.7* 7.5*    Anemia panel:  No results for input(s): VITAMINB12, FOLATE, FERRITIN, TIBC, IRON, RETICCTPCT in the last 72 hours.  Coagulation profile  Recent Labs Lab 08/05/14 1312  INR 1.21     Radiology Studies:   I have reviewed the imaging   Ct Abdomen Pelvis Wo Contrast  08/07/2014   COMPARISON:  CT on 08/04/2014 and MRI on 08/05/2014  FINDINGS: Lower chest:  New small right pleural effusion.  Hepatobiliary:  No mass visualized on this non-contrast exam.  Pancreas: No mass or inflammatory process visualized on this non-contrast exam.  Spleen:  Within normal limits in size.  Adrenal Glands:  No mass identified.  Kidneys/Urinary tract: No evidence of urolithiasis or hydronephrosis. 3 cm complex cyst with mild mural calcifications in the posterior upper pole the left kidney is stable.  Stomach/Bowel/Peritoneum: Mild increase and mesenteric edema and mild ascites is seen since previous study. Persistent wall thickening is seen involving multiple small bowel loops in the right abdomen. In the setting of superior mesenteric vein thrombosis, this is suspicious for bowel ischemia. Increased dilatation of proximal small bowel is also seen. No evidence of pneumatosis or free air.  Vascular/Lymphatic: No pathologically enlarged lymph nodes identified. Superior mesenteric vein remains distended, consistent with venous thrombosis as demonstrated on recent MRI.  Reproductive:  No mass or other significant abnormality noted.  Other:  None.  Musculoskeletal:  No suspicious bone lesions identified.  IMPRESSION: Persistent small bowel wall thickening in the right abdomen, suspicious for bowel ischemia in the setting of SMV thrombosis which  was demonstrated on recent MRI.  Increased proximal small bowel dilatation, suspicious for small bowel obstruction.  Increase in diffuse mesenteric edema and ascites.  Critical Value/emergent results were called by telephone at the time of interpretation on 08/07/2014 at 2:37 pm to Dr. Louellen Molder , who verbally acknowledged these results.   Electronically Signed   By: Earle Gell M.D.   On: 08/07/2014 14:37   Ct Abdomen Pelvis Wo Contrast  08/04/2014    COMPARISON:  July 06, 2002  FINDINGS: The liver, spleen, pancreas, gallbladder, adrenal glands and right kidney are normal. There is no hydronephrosis bilaterally. There is a 2.3 x 3 cm slight low-density lesion in the posterior midpole left kidney with minimal rim calcification ; on the previous CT, a simple cysts was present in the same location. There is atherosclerosis of the abdominal aorta without aneurysmal dilatation. There is no abdominal lymphadenopathy.  There are several loops of abnormal enlarged thick walled small bowel loops with surrounding inflammation and fluid in the right lower quadrant. There is no free air. The appendix is not definitely seen. The colon is normal.  Fluid-filled bladder is normal. Small amount of free fluid is identified in the pelvis. The lung bases are clear. Degenerative joint changes of the spine are noted.  IMPRESSION: Several loops of abnormal enlarged thick walled small bowel loops with surrounding inflammation and fluid in the right lower quadrant. The findings are nonspecific but can be due to infectious or inflammatory etiology.   Electronically Signed   By: Abelardo Diesel M.D.   On: 08/04/2014 07:37   Mr Abdomen/ MRA Wo Contrast  08/05/2014    FINDINGS: There is occlusive thrombus through the visualized superior mesenteric vein and main portal vein, terminating at the portal bifurcation. There may be small amount of nonocclusive thrombus extending into the left portal vein. Normal flow signal in the splenic  vein.  Limited assessment of the abdominal  aorta and proximal visceral and renal branches grossly unremarkable. IVC is patent.  There is a small amount perihepatic ,perisplenic, right lower quadrant mesenteric, and pelvic ascites. No focal liver lesion identified. 2.6 cm fluid signal partially exophytic probable cyst from the interpolar region left kidney. Right kidney, adrenal glands, pancreas, and nondilated gallbladder unremarkable.  IMPRESSION:  1. Occlusive thrombus in superior mesenteric vein extending through the main portal vein. 2. Abdominal ascites 3. Left renal cyst.   Electronically Signed   By: Arne Cleveland M.D.   On: 08/05/2014 15:34   ASSESSMENT/PLAN:    1. 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 He underwent transhepatic superior mesenteric vein / portal vein SMV/PV) lysis by interventional radiology on 08/08/14 with good response. He is currently on tPA and heparin His current anticoagulation therapy will interfere with some the tests and it is not possible to interpret the test results.Taking him off the anticoagulation therapy to do the tests may precipitate another thrombotic event.  Anticoagulation therapies would include warfarin, low molecular weight heparin such as Lovenox or newer agents such as Dabigatran and Rivaroxaban. The major culprit in this situation is recent test was around injections. The patient is very upset at this recommendation. According to him, the replacement therapy via a patch did not work. He has not used or tried testosterone replacement in the form of a gel. At this point in time, I reinforced the importance of avoiding further  Replacement due to recent life threatening event.  Mesenteric Ischemia Partial Small Bowel Obstruction This is likely due to  superior mesenteric vein thrombosis Improving after tPA infusion CEA taken on admission was 0.6 and CA 19.9 was 15.1, normal values No surgical intervention is indicated as per Surgery notes  History of CVA This was after a catheterization and stent in 2011 No significant residual noted. Patient is on anticoagulation  Transaminemia In the setting of alcohol habituation and portal vein thrombosis GI workup ongoing. Hepatitis panel pending  Acute kidney injury Likely due to acute tubular necrosis in the setting of small bowel obstruction Resolved with IV fluids, managed by primary team.  Bilateral lower extremity edema In the setting of low albumin, ascites, portal vein thrombosis Ultrasound of lower extremities is negative for DVT  Other medical issues including cardiac disease, hypercholesterolemia, as per primary team and specialties.  **Disclaimer: This note was dictated with voice recognition software. Similar sounding words can inadvertently be transcribed and this note may contain transcription errors which may not have been corrected upon publication of note.**    Rondel Jumbo, PA-C @T @ 11:35 AM  Alvy Bimler, Jo-Anne Kluth, MD 08/09/2014

## 2014-08-09 NOTE — Sedation Documentation (Signed)
Pt transferred to bed, hob elevated.

## 2014-08-09 NOTE — Sedation Documentation (Signed)
Verbal order given to restart Heparin gtt at previous rate per Dr. Anselm Pancoast.

## 2014-08-09 NOTE — Progress Notes (Signed)
ANTICOAGULATION CONSULT NOTE - Follow Up Consult  Pharmacy Consult for heparin Indication: mesenteric vein thrombosis  Allergies  Allergen Reactions  . Ace Inhibitors Cough  . Lipitor [Atorvastatin] Other (See Comments)    myalgia  . Codeine Itching    Patient Measurements: Height: 6\' 2"  (188 cm) Weight: (!) 307 lb 12.2 oz (139.6 kg) IBW/kg (Calculated) : 82.2 Heparin Dosing Weight:   Vital Signs: Temp: 99.2 F (37.3 C) (01/06 0453) Temp Source: Oral (01/06 0453) BP: 119/76 mmHg (01/06 0500) Pulse Rate: 94 (01/06 0500)  Labs:  Recent Labs  08/07/14 0610  08/08/14 1005 08/08/14 1338 08/08/14 2110 08/09/14 0442  HGB 13.7  --   --  13.9  --  13.4  HCT 42.7  --   --  43.3  --  41.5  PLT 233  --   --  257  --  242  HEPARINUNFRC 0.37  < > 0.16*  --  0.53 0.61  CREATININE 1.54*  --   --  1.42*  --   --   < > = values in this interval not displayed.  Estimated Creatinine Clearance: 93.6 mL/min (by C-G formula based on Cr of 1.42).   Medications:  Scheduled:  . allopurinol  300 mg Oral Daily  . alteplase (tPA / ACTIVASE) PE Lysis 24 mg/500 mL (UNILATERAL)  0.5 mg/hr Intravenous Once  . aspirin  81 mg Oral Daily  .  ceFAZolin (ANCEF) IV  2 g Intravenous 60 min Pre-Op  . ciprofloxacin  400 mg Intravenous Q12H  . famotidine (PEPCID) IV  20 mg Intravenous Q12H  . HYDROmorphone PCA 0.3 mg/mL   Intravenous 6 times per day  . metoprolol succinate  50 mg Oral Daily  . metronidazole  500 mg Intravenous Q8H  . omega-3 acid ethyl esters  1 g Oral Daily  . sodium chloride  3 mL Intravenous Q12H    Assessment: 49 yr old male with mesenteric thrombosis and extensive DVT from small ileacolic branches into the MPV on IV heparin IV TPA at 0.5 mg/hr x 24 hours. TPA infusion started at 1639 on 1/5. HL this AM is supra-therapeutic at 0.61 on 2700 units/hr. H/H and Plt wnl. No bleeding per RN.   Goal of Therapy:  Heparin level 0.3-0.5 units/ml (while on TPA) Monitor platelets by  anticoagulation protocol: Yes   Plan:  Decrease heparin infusion to 2600 units/hr  F/U 6 hr HL at 1200 Monitor daily CBC, HL and s/s of bleeding    Albertina Parr, PharmD., BCPS Clinical Pharmacist Pager [phone]

## 2014-08-09 NOTE — Sedation Documentation (Addendum)
Patient is resting comfortably. Cool wet cloth to head.

## 2014-08-09 NOTE — Sedation Documentation (Signed)
C/o pain to rt side, 6/10 on pain scale. Dylan [name] pt because he is feeling hot.

## 2014-08-10 ENCOUNTER — Inpatient Hospital Stay (HOSPITAL_COMMUNITY): Payer: BLUE CROSS/BLUE SHIELD

## 2014-08-10 DIAGNOSIS — R1010 Upper abdominal pain, unspecified: Secondary | ICD-10-CM

## 2014-08-10 DIAGNOSIS — R109 Unspecified abdominal pain: Secondary | ICD-10-CM

## 2014-08-10 DIAGNOSIS — I8290 Acute embolism and thrombosis of unspecified vein: Secondary | ICD-10-CM

## 2014-08-10 LAB — COMPREHENSIVE METABOLIC PANEL
ALBUMIN: 2.2 g/dL — AB (ref 3.5–5.2)
ALT: 108 U/L — AB (ref 0–53)
AST: 138 U/L — AB (ref 0–37)
Alkaline Phosphatase: 66 U/L (ref 39–117)
Anion gap: 5 (ref 5–15)
BUN: 18 mg/dL (ref 6–23)
CO2: 26 mmol/L (ref 19–32)
Calcium: 7.8 mg/dL — ABNORMAL LOW (ref 8.4–10.5)
Chloride: 105 mEq/L (ref 96–112)
Creatinine, Ser: 1.16 mg/dL (ref 0.50–1.35)
GFR calc Af Amer: 84 mL/min — ABNORMAL LOW (ref >90)
GFR calc non Af Amer: 72 mL/min — ABNORMAL LOW (ref >90)
Glucose, Bld: 104 mg/dL — ABNORMAL HIGH (ref 70–99)
Potassium: 4.7 mmol/L (ref 3.5–5.1)
SODIUM: 136 mmol/L (ref 135–145)
TOTAL PROTEIN: 4.7 g/dL — AB (ref 6.0–8.3)
Total Bilirubin: 1 mg/dL (ref 0.3–1.2)

## 2014-08-10 LAB — CBC
HCT: 40.3 % (ref 39.0–52.0)
HCT: 40.6 % (ref 39.0–52.0)
HEMOGLOBIN: 13 g/dL (ref 13.0–17.0)
Hemoglobin: 13 g/dL (ref 13.0–17.0)
MCH: 29.3 pg (ref 26.0–34.0)
MCH: 29.1 pg (ref 26.0–34.0)
MCHC: 32.3 g/dL (ref 30.0–36.0)
MCHC: 32 g/dL (ref 30.0–36.0)
MCV: 90.8 fL (ref 78.0–100.0)
MCV: 90.8 fL (ref 78.0–100.0)
PLATELETS: 267 10*3/uL (ref 150–400)
Platelets: 276 10*3/uL (ref 150–400)
RBC: 4.44 MIL/uL (ref 4.22–5.81)
RBC: 4.47 MIL/uL (ref 4.22–5.81)
RDW: 14.8 % (ref 11.5–15.5)
RDW: 14.8 % (ref 11.5–15.5)
WBC: 7.7 10*3/uL (ref 4.0–10.5)
WBC: 7.5 10*3/uL (ref 4.0–10.5)

## 2014-08-10 LAB — HEPARIN LEVEL (UNFRACTIONATED)
HEPARIN UNFRACTIONATED: 0.64 [IU]/mL (ref 0.30–0.70)
Heparin Unfractionated: 0.36 IU/mL (ref 0.30–0.70)
Heparin Unfractionated: 0.51 IU/mL (ref 0.30–0.70)

## 2014-08-10 LAB — HEPATITIS C ANTIBODY: HCV Ab: NEGATIVE

## 2014-08-10 LAB — PROCALCITONIN: Procalcitonin: 0.45 ng/mL

## 2014-08-10 LAB — FIBRINOGEN
FIBRINOGEN: 586 mg/dL — AB (ref 204–475)
Fibrinogen: 562 mg/dL — ABNORMAL HIGH (ref 204–475)

## 2014-08-10 LAB — GLUCOSE, CAPILLARY
Glucose-Capillary: 90 mg/dL (ref 70–99)
Glucose-Capillary: 120 mg/dL — ABNORMAL HIGH (ref 70–99)

## 2014-08-10 LAB — HEPATITIS B SURFACE ANTIGEN: Hepatitis B Surface Ag: NEGATIVE

## 2014-08-10 LAB — MAGNESIUM: MAGNESIUM: 1.7 mg/dL (ref 1.5–2.5)

## 2014-08-10 LAB — PHOSPHORUS: PHOSPHORUS: 3 mg/dL (ref 2.3–4.6)

## 2014-08-10 MED ORDER — LIDOCAINE HCL 1 % IJ SOLN
INTRAMUSCULAR | Status: AC
  Filled 2014-08-10: qty 20

## 2014-08-10 MED ORDER — SODIUM CHLORIDE 0.9 % IV SOLN
INTRAVENOUS | Status: AC | PRN
  Administered 2014-08-10: 10 mL/h via INTRAVENOUS

## 2014-08-10 MED ORDER — LIDOCAINE VISCOUS 2 % MT SOLN
OROMUCOSAL | Status: AC
  Filled 2014-08-10: qty 15

## 2014-08-10 MED ORDER — GELATIN ABSORBABLE 12-7 MM EX MISC
CUTANEOUS | Status: AC
  Filled 2014-08-10: qty 3

## 2014-08-10 MED ORDER — MIDAZOLAM HCL 2 MG/2ML IJ SOLN
INTRAMUSCULAR | Status: AC
  Filled 2014-08-10: qty 2

## 2014-08-10 MED ORDER — MIDAZOLAM HCL 2 MG/2ML IJ SOLN
INTRAMUSCULAR | Status: AC | PRN
  Administered 2014-08-10 (×3): 1 mg via INTRAVENOUS

## 2014-08-10 MED ORDER — FENTANYL CITRATE 0.05 MG/ML IJ SOLN
INTRAMUSCULAR | Status: AC
  Filled 2014-08-10: qty 2

## 2014-08-10 MED ORDER — ZOLPIDEM TARTRATE 5 MG PO TABS
5.0000 mg | ORAL_TABLET | Freq: Every evening | ORAL | Status: DC | PRN
  Administered 2014-08-10: 5 mg via ORAL
  Filled 2014-08-10: qty 1

## 2014-08-10 MED ORDER — FENTANYL CITRATE 0.05 MG/ML IJ SOLN
INTRAMUSCULAR | Status: AC | PRN
  Administered 2014-08-10 (×3): 50 ug via INTRAVENOUS

## 2014-08-10 MED ORDER — MAGNESIUM SULFATE 2 GM/50ML IV SOLN
2.0000 g | Freq: Once | INTRAVENOUS | Status: AC
  Administered 2014-08-10: 2 g via INTRAVENOUS
  Filled 2014-08-10: qty 50

## 2014-08-10 MED ORDER — IOHEXOL 300 MG/ML  SOLN
50.0000 mL | Freq: Once | INTRAMUSCULAR | Status: AC | PRN
  Administered 2014-08-10: 50 mL via INTRAVENOUS

## 2014-08-10 NOTE — Progress Notes (Signed)
Referring Physician(s): TRH; PCCM  Subjective:  Portal vein/ superior mesenteric vein thrombus abd pain; bloating- probable obstruction Thrombolysis infusion started in IR via portal vein to SMV 08/08/14 Recheck 1/6- minimal improvement Pt does feel some better; still very bloated Feels bowel "rumble last night and today" Complains of belching "old fish" taste x 1 week For recheck again today   Allergies: Ace inhibitors; Lipitor; and Codeine  Medications: Prior to Admission medications   Medication Sig Start Date End Date Taking? Authorizing Provider  allopurinol (ZYLOPRIM) 300 MG tablet Take 1 tablet by mouth daily. 07/03/14  Yes Historical Provider, MD  aspirin 81 MG chewable tablet Chew 1 tablet (81 mg total) by mouth daily. 07/07/12  Yes Luke K Kilroy, PA-C  azithromycin (ZITHROMAX) 500 MG tablet See admin instructions. 3 tabs on day 1, 2 tabs daily on days 2 and 3 07/25/14  Yes Historical Provider, MD  cefdinir (OMNICEF) 300 MG capsule Take 1 capsule by mouth 2 (two) times daily. 07/29/14  Yes Historical Provider, MD  furosemide (LASIX) 20 MG tablet Take 20 mg by mouth daily.   Yes Historical Provider, MD  Liraglutide -Weight Management (SAXENDA) 18 MG/3ML SOPN Inject 3 mLs into the skin daily.   Yes Historical Provider, MD  metoprolol succinate (TOPROL-XL) 50 MG 24 hr tablet Take 50 mg by mouth daily. Take with or immediately following a meal.   Yes Historical Provider, MD  olmesartan-hydrochlorothiazide (BENICAR HCT) 40-25 MG per tablet Take 1 tablet by mouth daily.   Yes Historical Provider, MD  omega-3 acid ethyl esters (LOVAZA) 1 G capsule Take 1 capsule (1 g total) by mouth daily. <please make appointment>   Yes Brittainy M Simmons, PA-C  predniSONE (STERAPRED UNI-PAK) 5 MG TABS tablet See admin instructions. 6 tabs on the first day, decreasing by 1 tab daily until gone 07/29/14  Yes Historical Provider, MD  testosterone cypionate (DEPOTESTOTERONE CYPIONATE) 200 MG/ML  injection Inject 1 mL into the muscle once a week. Wednesday 06/07/14  Yes Historical Provider, MD  acetaminophen (TYLENOL) 325 MG tablet Take 2 tablets (650 mg total) by mouth every 4 (four) hours as needed. 04/19/13   Erlene Quan, PA-C  fenofibrate 160 MG tablet Take 1 tablet (160 mg total) by mouth daily. Patient not taking: Reported on 08/04/2014 04/18/13   Brittainy M Rosita Fire, PA-C  metoprolol succinate (TOPROL-XL) 25 MG 24 hr tablet Take 1 tablet (25 mg total) by mouth daily. <please make appointment> Patient not taking: Reported on 08/04/2014    Erlene Quan, PA-C  nitroGLYCERIN (NITROSTAT) 0.4 MG SL tablet Place 1 tablet (0.4 mg total) under the tongue every 5 (five) minutes x 3 doses as needed for chest pain. 07/07/12   Erlene Quan, PA-C    Review of Systems  Vital Signs: BP 149/91 mmHg  Pulse 92  Temp(Src) 98.6 F (37 C) (Oral)  Resp 20  Ht 6\' 2"  (1.88 m)  Wt 139.6 kg (307 lb 12.2 oz)  BMI 39.50 kg/m2  SpO2 94%  Physical Exam  Constitutional: He is oriented to person, place, and time.  afeb Pleasant Fibrinogen 562 T Bili 1.0 Bun/Cr: 18/1.6 Elevated LFTs   Cardiovascular: Normal rate and regular rhythm.   Pulmonary/Chest: Effort normal and breath sounds normal. He has no wheezes.  states feels hard to breathe deep  Abdominal: He exhibits distension. There is tenderness.  Definite distension Mild pain with deep palpation Tense abd Minimal BS  Neurological: He is alert and oriented to person, place, and time.  Skin:  Site of infusion catheter is NT; small oozing noted immediate site   Psychiatric: He has a normal mood and affect. His behavior is normal. Judgment and thought content normal.    Imaging: Ct Abdomen Pelvis Wo Contrast  08/07/2014   CLINICAL DATA:  Generalized abdominal pain and cramping. Nausea and vomiting. Superior mesenteric vein thrombosis.  EXAM: CT ABDOMEN AND PELVIS WITHOUT CONTRAST  TECHNIQUE: Multidetector CT imaging of the abdomen and pelvis  was performed following the standard protocol without IV contrast.  COMPARISON:  CT on 08/04/2014 and MRI on 08/05/2014  FINDINGS: Lower chest:  New small right pleural effusion.  Hepatobiliary:  No mass visualized on this non-contrast exam.  Pancreas: No mass or inflammatory process visualized on this non-contrast exam.  Spleen:  Within normal limits in size.  Adrenal Glands:  No mass identified.  Kidneys/Urinary tract: No evidence of urolithiasis or hydronephrosis. 3 cm complex cyst with mild mural calcifications in the posterior upper pole the left kidney is stable.  Stomach/Bowel/Peritoneum: Mild increase and mesenteric edema and mild ascites is seen since previous study. Persistent wall thickening is seen involving multiple small bowel loops in the right abdomen. In the setting of superior mesenteric vein thrombosis, this is suspicious for bowel ischemia. Increased dilatation of proximal small bowel is also seen. No evidence of pneumatosis or free air.  Vascular/Lymphatic: No pathologically enlarged lymph nodes identified. Superior mesenteric vein remains distended, consistent with venous thrombosis as demonstrated on recent MRI.  Reproductive:  No mass or other significant abnormality noted.  Other:  None.  Musculoskeletal:  No suspicious bone lesions identified.  IMPRESSION: Persistent small bowel wall thickening in the right abdomen, suspicious for bowel ischemia in the setting of SMV thrombosis which was demonstrated on recent MRI.  Increased proximal small bowel dilatation, suspicious for small bowel obstruction.  Increase in diffuse mesenteric edema and ascites.  Critical Value/emergent results were called by telephone at the time of interpretation on 08/07/2014 at 2:37 pm to Dr. Louellen Molder , who verbally acknowledged these results.   Electronically Signed   By: Earle Gell M.D.   On: 08/07/2014 14:37   Ir Angiogram Selective Each Additional Vessel  08/08/2014   CLINICAL DATA:  Superior mesenteric  vein and portal vein DVT with bowel ischemia.  EXAM: IR ULTRASOUND GUIDANCE VASC ACCESS RIGHT; THROMBOECTOMY MECHANICAL VENOUS; IR INFUSION THROMBOL VENOUS INITIAL (MS); PORTAL VENOGRAPHY; ADDITIONAL ARTERIOGRAPHY  FLUOROSCOPY TIME:  8 min and 6 seconds.  MEDICATIONS AND MEDICAL HISTORY: Versed 4 mg, Fentanyl 175 mcg.  As antibiotic prophylaxis, Ancef was ordered pre-procedure and administered intravenously within one hour of incision.  ANESTHESIA/SEDATION: Moderate sedation time: 52 minutes  CONTRAST:  50 cc Omnipaque 300.  Carbon dioxide.  PROCEDURE: The procedure, risks, benefits, and alternatives were explained to the patient. Questions regarding the procedure were encouraged and answered. The patient understands and consents to the procedure.  The right flank was prepped with Betadine in a sterile fashion, and a sterile drape was applied covering the operative field. A sterile gown and sterile gloves were used for the procedure.  1% lidocaine was utilized for local infiltration. Under sonographic guidance, a 22 gauge Chiba needle was inserted into the peripheral aspect of the main right portal vein. Contrast was injected confirming needle position. The needle was removed over a 018 wire. The Accustick dilator was inserted. A small amount of contrast was injected. Carbon dioxide portal venography was performed.  The Accustick dilator was exchanged over the 3 J for a 5  [place] Kumpe the catheter. This was advanced into the lower portal vein and venography was performed. The catheter was advanced into the upper SMV and contrast was injected. There is partially occlusive thrombus in the main portal vein and occlusive thrombus in the SMV.  The catheter was carefully advanced over a glidewire to the small venous branches of the distal ileum in the right lower quadrant. Contrast was injected demonstrating stasis of flow within the small branches adjacent to the intestines and complete occlusion within the larger more  central branches.  The Kumpe the catheter was removed over a Rosen wire. A 6 French sheath was advanced over the wire to the upper main portal vein. The Angiojet was utilized to mechanically lysed the thrombus for 96 seconds.  Subsequently, a 90 cm 20 cm infusion length 5 French multi side-hole catheter was advanced over the [place] wire to the right lower quadrant. The catheter is positioned along side the thrombus extending from the main portal vein to the ileum. Lysis with 0.5 mg tPA was then instituted. Heparin protocol was resumed per peripheral access.  FINDINGS: Portal venography confirms partial occlusion of the main portal vein and complete occlusion of the SMV. Splenic vein is patent.  Subsequent venogram images confirmed occlusion of mesenteric brain branches to the right lower quadrant. Peripheral branches adjacent to the intestine are patent with stasis of flow.  The neck series of images demonstrate placement of a 6 French sheath and multi side hole catheter along side the thrombus, extending from the main portal vein to the small mesenteric venous branches in the right lower quadrant.  COMPLICATIONS: None  IMPRESSION: Successful mechanical and pharmacological lysis of thrombus in the main portal vein, superior mesenteric vein, and venous branches in the right lower quadrant. TPA had 0.5 milligrams/hour will be instituted via multi side-hole catheter along side the thrombus.   Electronically Signed   By: Maryclare Bean M.D.   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  EXAM: IR ULTRASOUND GUIDANCE VASC ACCESS RIGHT; THROMBOECTOMY MECHANICAL VENOUS; IR INFUSION THROMBOL VENOUS INITIAL (MS); PORTAL VENOGRAPHY; ADDITIONAL ARTERIOGRAPHY  FLUOROSCOPY TIME:  8 min and 6 seconds.  MEDICATIONS AND MEDICAL HISTORY: Versed 4 mg, Fentanyl 175 mcg.  As antibiotic prophylaxis, Ancef was ordered pre-procedure and  administered intravenously within one hour of incision.  ANESTHESIA/SEDATION: Moderate sedation time: 52 minutes  CONTRAST:  50 cc Omnipaque 300.  Carbon dioxide.  PROCEDURE: The procedure, risks, benefits, and alternatives were explained to the patient. Questions regarding the procedure were encouraged and answered. The patient understands and consents to the procedure.  The right flank was prepped with Betadine in a sterile fashion, and a sterile drape was applied covering the operative field. A sterile gown and sterile gloves were used for the procedure.  1% lidocaine was utilized for local infiltration. Under sonographic guidance, a 22 gauge Chiba needle was inserted into the peripheral aspect of the main right portal vein. Contrast was injected confirming needle position. The needle was removed over a 018 wire. The Accustick dilator was inserted. A small amount of contrast was injected. Carbon dioxide portal venography was performed.  The Accustick dilator was exchanged over the 3 J for a 5 [place] Kumpe the catheter. This was advanced into the lower portal vein and venography was performed. The catheter was advanced into the upper SMV and contrast was injected. There is partially occlusive  thrombus in the main portal vein and occlusive thrombus in the SMV.  The catheter was carefully advanced over a glidewire to the small venous branches of the distal ileum in the right lower quadrant. Contrast was injected demonstrating stasis of flow within the small branches adjacent to the intestines and complete occlusion within the larger more central branches.  The Kumpe the catheter was removed over a Rosen wire. A 6 French sheath was advanced over the wire to the upper main portal vein. The Angiojet was utilized to mechanically lysed the thrombus for 96 seconds.  Subsequently, a 90 cm 20 cm infusion length 5 French multi side-hole catheter was advanced over the [place] wire to the right lower quadrant. The catheter is  positioned along side the thrombus extending from the main portal vein to the ileum. Lysis with 0.5 mg tPA was then instituted. Heparin protocol was resumed per peripheral access.  FINDINGS: Portal venography confirms partial occlusion of the main portal vein and complete occlusion of the SMV. Splenic vein is patent.  Subsequent venogram images confirmed occlusion of mesenteric brain branches to the right lower quadrant. Peripheral branches adjacent to the intestine are patent with stasis of flow.  The neck series of images demonstrate placement of a 6 French sheath and multi side hole catheter along side the thrombus, extending from the main portal vein to the small mesenteric venous branches in the right lower quadrant.  COMPLICATIONS: None  IMPRESSION: Successful mechanical and pharmacological lysis of thrombus in the main portal vein, superior mesenteric vein, and venous branches in the right lower quadrant. TPA had 0.5 milligrams/hour will be instituted via multi side-hole catheter along side the thrombus.   Electronically Signed   By: Maryclare Bean M.D.   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  EXAM: IR ULTRASOUND GUIDANCE VASC ACCESS RIGHT; THROMBOECTOMY MECHANICAL VENOUS; IR INFUSION THROMBOL VENOUS INITIAL (MS); PORTAL VENOGRAPHY; ADDITIONAL ARTERIOGRAPHY  FLUOROSCOPY TIME:  8 min and 6 seconds.  MEDICATIONS AND MEDICAL HISTORY: Versed 4 mg, Fentanyl 175 mcg.  As antibiotic prophylaxis, Ancef was ordered pre-procedure and administered intravenously within one hour of incision.  ANESTHESIA/SEDATION: Moderate sedation time: 52 minutes  CONTRAST:  50 cc Omnipaque 300.  Carbon dioxide.  PROCEDURE: The procedure, risks, benefits, and alternatives were explained to the patient. Questions regarding the procedure were encouraged and answered. The patient understands and consents to the procedure.  The right  flank was prepped with Betadine in a sterile fashion, and a sterile drape was applied covering the operative field. A sterile gown and sterile gloves were used for the procedure.  1% lidocaine was utilized for local infiltration. Under sonographic guidance, a 22 gauge Chiba needle was inserted into the peripheral aspect of the main right portal vein. Contrast was injected confirming needle position. The needle was removed over a 018 wire. The Accustick dilator was inserted. A small amount of contrast was injected. Carbon dioxide portal venography was performed.  The Accustick dilator was exchanged over the 3 J for a 5 [place] Kumpe the catheter. This was advanced into the lower portal vein and venography was performed. The catheter was advanced into the upper SMV and contrast was injected. There is partially occlusive thrombus in the main portal vein and occlusive thrombus in the SMV.  The catheter was carefully advanced over a glidewire to the small venous branches of the distal ileum in  the right lower quadrant. Contrast was injected demonstrating stasis of flow within the small branches adjacent to the intestines and complete occlusion within the larger more central branches.  The Kumpe the catheter was removed over a Rosen wire. A 6 French sheath was advanced over the wire to the upper main portal vein. The Angiojet was utilized to mechanically lysed the thrombus for 96 seconds.  Subsequently, a 90 cm 20 cm infusion length 5 French multi side-hole catheter was advanced over the [place] wire to the right lower quadrant. The catheter is positioned along side the thrombus extending from the main portal vein to the ileum. Lysis with 0.5 mg tPA was then instituted. Heparin protocol was resumed per peripheral access.  FINDINGS: Portal venography confirms partial occlusion of the main portal vein and complete occlusion of the SMV. Splenic vein is patent.  Subsequent venogram images confirmed occlusion of mesenteric brain  branches to the right lower quadrant. Peripheral branches adjacent to the intestine are patent with stasis of flow.  The neck series of images demonstrate placement of a 6 French sheath and multi side hole catheter along side the thrombus, extending from the main portal vein to the small mesenteric venous branches in the right lower quadrant.  COMPLICATIONS: None  IMPRESSION: Successful mechanical and pharmacological lysis of thrombus in the main portal vein, superior mesenteric vein, and venous branches in the right lower quadrant. TPA had 0.5 milligrams/hour will be instituted via multi side-hole catheter along side the thrombus.   Electronically Signed   By: Maryclare Bean M.D.   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  EXAM: IR ULTRASOUND GUIDANCE VASC ACCESS RIGHT; THROMBOECTOMY MECHANICAL VENOUS; IR INFUSION THROMBOL VENOUS INITIAL (MS); PORTAL VENOGRAPHY; ADDITIONAL ARTERIOGRAPHY  FLUOROSCOPY TIME:  8 min and 6 seconds.  MEDICATIONS AND MEDICAL HISTORY: Versed 4 mg, Fentanyl 175 mcg.  As antibiotic prophylaxis, Ancef was ordered pre-procedure and administered intravenously within one hour of incision.  ANESTHESIA/SEDATION: Moderate sedation time: 52 minutes  CONTRAST:  50 cc Omnipaque 300.  Carbon dioxide.  PROCEDURE: The procedure, risks, benefits, and alternatives were explained to the patient. Questions regarding the procedure were encouraged and answered. The patient understands and consents to the procedure.  The right flank was prepped with Betadine in a sterile fashion, and a sterile drape was applied covering the operative field. A sterile gown and sterile gloves were used for the procedure.  1% lidocaine was utilized for local infiltration. Under sonographic guidance, a 22 gauge Chiba needle was inserted into the peripheral aspect of the main right portal vein. Contrast was injected confirming  needle position. The needle was removed over a 018 wire. The Accustick dilator was inserted. A small amount of contrast was injected. Carbon dioxide portal venography was performed.  The Accustick dilator was exchanged over the 3 J for a 5 [place] Kumpe the catheter. This was advanced into the lower portal vein and venography was performed. The catheter was advanced into the upper SMV and contrast was injected. There is partially occlusive thrombus in the main portal vein and occlusive thrombus in the SMV.  The catheter was carefully advanced over a glidewire to the small venous branches of the distal ileum in the right lower quadrant. Contrast was injected demonstrating stasis of flow within the small branches adjacent to the intestines and complete occlusion within the larger more central branches.  The Kumpe  the catheter was removed over a [place] wire. A 6 French sheath was advanced over the wire to the upper main portal vein. The Angiojet was utilized to mechanically lysed the thrombus for 96 seconds.  Subsequently, a 90 cm 20 cm infusion length 5 French multi side-hole catheter was advanced over the [place] wire to the right lower quadrant. The catheter is positioned along side the thrombus extending from the main portal vein to the ileum. Lysis with 0.5 mg tPA was then instituted. Heparin protocol was resumed per peripheral access.  FINDINGS: Portal venography confirms partial occlusion of the main portal vein and complete occlusion of the SMV. Splenic vein is patent.  Subsequent venogram images confirmed occlusion of mesenteric brain branches to the right lower quadrant. Peripheral branches adjacent to the intestine are patent with stasis of flow.  The neck series of images demonstrate placement of a 6 French sheath and multi side hole catheter along side the thrombus, extending from the main portal vein to the small mesenteric venous branches in the right lower quadrant.  COMPLICATIONS: None  IMPRESSION:  Successful mechanical and pharmacological lysis of thrombus in the main portal vein, superior mesenteric vein, and venous branches in the right lower quadrant. TPA had 0.5 milligrams/hour will be instituted via multi side-hole catheter along side the thrombus.   Electronically Signed   By: Maryclare Bean M.D.   On: 08/08/2014 17:21   Ir Infusion Thrombol Venous Initial (ms)  08/08/2014   CLINICAL DATA:  Superior mesenteric vein and portal vein DVT with bowel ischemia.  EXAM: IR ULTRASOUND GUIDANCE VASC ACCESS RIGHT; THROMBOECTOMY MECHANICAL VENOUS; IR INFUSION THROMBOL VENOUS INITIAL (MS); PORTAL VENOGRAPHY; ADDITIONAL ARTERIOGRAPHY  FLUOROSCOPY TIME:  8 min and 6 seconds.  MEDICATIONS AND MEDICAL HISTORY: Versed 4 mg, Fentanyl 175 mcg.  As antibiotic prophylaxis, Ancef was ordered pre-procedure and administered intravenously within one hour of incision.  ANESTHESIA/SEDATION: Moderate sedation time: 52 minutes  CONTRAST:  50 cc Omnipaque 300.  Carbon dioxide.  PROCEDURE: The procedure, risks, benefits, and alternatives were explained to the patient. Questions regarding the procedure were encouraged and answered. The patient understands and consents to the procedure.  The right flank was prepped with Betadine in a sterile fashion, and a sterile drape was applied covering the operative field. A sterile gown and sterile gloves were used for the procedure.  1% lidocaine was utilized for local infiltration. Under sonographic guidance, a 22 gauge Chiba needle was inserted into the peripheral aspect of the main right portal vein. Contrast was injected confirming needle position. The needle was removed over a 018 wire. The Accustick dilator was inserted. A small amount of contrast was injected. Carbon dioxide portal venography was performed.  The Accustick dilator was exchanged over the 3 J for a 5 [place] Kumpe the catheter. This was advanced into the lower portal vein and venography was performed. The catheter was advanced  into the upper SMV and contrast was injected. There is partially occlusive thrombus in the main portal vein and occlusive thrombus in the SMV.  The catheter was carefully advanced over a glidewire to the small venous branches of the distal ileum in the right lower quadrant. Contrast was injected demonstrating stasis of flow within the small branches adjacent to the intestines and complete occlusion within the larger more central branches.  The Kumpe the catheter was removed over a [place] wire. A 6 French sheath was advanced over the wire to the upper main portal vein. The Angiojet was utilized to mechanically lysed the  thrombus for 96 seconds.  Subsequently, a 90 cm 20 cm infusion length 5 French multi side-hole catheter was advanced over the [place] wire to the right lower quadrant. The catheter is positioned along side the thrombus extending from the main portal vein to the ileum. Lysis with 0.5 mg tPA was then instituted. Heparin protocol was resumed per peripheral access.  FINDINGS: Portal venography confirms partial occlusion of the main portal vein and complete occlusion of the SMV. Splenic vein is patent.  Subsequent venogram images confirmed occlusion of mesenteric brain branches to the right lower quadrant. Peripheral branches adjacent to the intestine are patent with stasis of flow.  The neck series of images demonstrate placement of a 6 French sheath and multi side hole catheter along side the thrombus, extending from the main portal vein to the small mesenteric venous branches in the right lower quadrant.  COMPLICATIONS: None  IMPRESSION: Successful mechanical and pharmacological lysis of thrombus in the main portal vein, superior mesenteric vein, and venous branches in the right lower quadrant. TPA had 0.5 milligrams/hour will be instituted via multi side-hole catheter along side the thrombus.   Electronically Signed   By: Maryclare Bean M.D.   On: 08/08/2014 17:21   Ir Jacolyn Reedy F/u Eval Art/ven Alwyn Ren Day  (ms)  08/09/2014   CLINICAL DATA:  49 year old with SMV and portal vein thrombus and concern for bowel ischemia. Catheter-directed thrombolytic therapy was started on 08/08/2014. Patient returns for follow-up angiography.  EXAM: THROMBOLYTIC FOLLOW-UP ANGIOGRAPHY  Physician: Stephan Minister. Henn, MD  FLUOROSCOPY TIME:  6 min and 18 seconds, 1090 mGy  MEDICATIONS: 2 mg Versed, 100 mcg fentanyl. A radiology nurse monitored the patient for moderate sedation.  ANESTHESIA/SEDATION: Moderate sedation time: 60 min  PROCEDURE: Patient was placed supine. The existing catheters in the right lateral abdomen were prepped and draped in sterile fashion. Maximal barrier sterile technique was utilized including caps, mask, sterile gowns, sterile gloves, sterile drape, hand hygiene and skin antiseptic. Contrast was initially injected through the infusion wire port. Subsequently, the infusion wire was removed and contrast was injected through the infusion catheter. These initial angiographic images were limited. As a result, the infusion catheter was removed for a Kumpe catheter over a Bentson wire. Additional angiography was performed. A new 135 cm infusion catheter with 20 cm of infusion length was advanced over a Rosen wire. Tip was placed in the right lower quadrant and similar placed from the previous study. Catheters were secured to the patient. IV heparin and catheter-directed tPA was again started. TPA was started at 0.5 mg/hour.  FINDINGS: There is hepatopetal flow in the SMV but there continues to be a large amount of thrombus throughout the dilated SMV. There is flow within the main portal vein and the proximal left and right portal veins. Suspect nonocclusive clot in the main portal vein.  Multiple dilated loops of small bowel throughout the abdomen  COMPLICATIONS: None  IMPRESSION: There is flow in the superior mesenteric vein but there continues to be a large amount of irregular thrombus. As a result, a new infusion catheter  was placed. Will continue catheter-directed thrombolytic therapy overnight with IV heparin. Plan to have the patient return to Interventional Radiology on 08/10/2014 for follow-up angiography and possibly mechanical thrombectomy.  Patient continues to have multiple dilated loops of small bowel throughout the abdomen. The patient's symptoms are improving with catheter-directed thrombolytic therapy but the radiographic appearance of the abdomen is still concerning for a bowel obstruction.   Electronically Signed  By: Markus Daft M.D.   On: 08/09/2014 18:16    Labs:  CBC:  Recent Labs  08/09/14 0854 08/09/14 1720 08/09/14 2300 08/10/14 0500  WBC 7.7 7.5 7.4 7.7  HGB 13.3 13.5 13.4 13.0  HCT 41.4 42.2 41.3 40.3  PLT 244 246 267 267    COAGS:  Recent Labs  08/05/14 1312  INR 1.21  APTT 28    BMP:  Recent Labs  08/07/14 0610 08/08/14 1338 08/09/14 0442 08/10/14 0500  NA 132* 134* 137 136  K 4.4 4.1 4.2 4.7  CL 99 104 107 105  CO2 30 23 24 26   GLUCOSE 122* 106* 91 104*  BUN 35* 24* 23 18  CALCIUM 7.2* 7.7* 7.5* 7.8*  CREATININE 1.54* 1.42* 1.31 1.16  GFRNONAA 51* 57* 62* 72*  GFRAA 60* 66* 72* 84*    LIVER FUNCTION TESTS:  Recent Labs  08/04/14 0412 08/08/14 1338 08/09/14 0442 08/10/14 0500  BILITOT 0.9 0.6 1.0 1.0  AST 34 124* 187* 138*  ALT 39 71* 100* 108*  ALKPHOS 69 57 58 66  PROT 7.0 5.2* 4.5* 4.7*  ALBUMIN 3.6 2.3* 2.1* 2.2*    Assessment and Plan:  Portal and Superior mesenteric vein thrombus Lysis ongoing Will recheck today Report to Dr Pascal Lux    I spent a total of 45minto face in clinical consultation/evaluation, greater than 50% of which was counseling/coordinating care for PV/SMV throbolysis  Signed: Ellwood Steidle A 08/10/2014, 8:19 AM

## 2014-08-10 NOTE — Progress Notes (Addendum)
ANTICOAGULATION CONSULT NOTE - Follow Up Consult  Pharmacy Consult for heparin Indication: mesenteric vein thrombosis  Allergies  Allergen Reactions  . Ace Inhibitors Cough  . Lipitor [Atorvastatin] Other (See Comments)    myalgia  . Codeine Itching    Patient Measurements: Height: 6\' 2"  (188 cm) Weight: (!) 307 lb 12.2 oz (139.6 kg) IBW/kg (Calculated) : 82.2 Heparin Dosing Weight: 113 kg  Vital Signs: Temp: 98.7 F (37.1 C) (01/07 0831) Temp Source: Oral (01/07 0831) BP: 149/91 mmHg (01/07 0800) Pulse Rate: 92 (01/07 0800)  Labs:  Recent Labs  08/08/14 1338  08/09/14 0442  08/09/14 1720 08/09/14 2300 08/10/14 0500  HGB 13.9  --  13.4  < > 13.5 13.4 13.0  HCT 43.3  --  41.5  < > 42.2 41.3 40.3  PLT 257  --  242  < > 246 267 267  HEPARINUNFRC  --   < > 0.61  < > 0.29* 0.64 0.36  CREATININE 1.42*  --  1.31  --   --   --  1.16  < > = values in this interval not displayed.  Estimated Creatinine Clearance: 114.6 mL/min (by C-G formula based on Cr of 1.16).   Assessment: 49 yr old male with mesenteric thrombosis and extensive DVT from small ileacolic branches into the MPV s/p lysis with alteplase started 1/5 - remains on alteplase as angiogram 1/6 showed large amount of thrombus remaining. IR to re-evaluate today. Heparin level therapeutic at 0.33. Per RN, no s/s of bleeding noticed. H/H and Plt remain stable.   Goal of Therapy:  Heparin level 0.3-0.5 units/ml (while on TPA) Monitor platelets by anticoagulation protocol: Yes   Plan:  -Continue heparin at 2300 units/hr -F/u 1100 HL to confirm that heparin remains therapeutic.  -Monitor for s/s of bleeding -F/u post IR re-eval today for plans for TPA and heparin  Sherlon Handing, PharmD, BCPS Clinical pharmacist, pager [phone] 08/10/2014 9:31 AM   Addendum 08/10/14 1315 Heparin level 0.51 on 2300 units/hr - slightly supratherapeutic for lower goal 0.3-0.5 with alteplase running.  Plan: 1) Decrease heparin to 2200  units/hr 2) Will f/u past IR today as heparin will likely be held if sheath to be removed  Sherlon Handing, PharmD, BCPS Clinical pharmacist, pager [phone] 08/10/2014 1:16 PM    ===========================================   Addendum: - heparin to resume immediately at previous rate without bolus.  After 2330, pharmacy may bolus and adjust as necessary. - off tPA   Plan: - Resume IV heparin at 2200 units/hr - Check 6 hr HL    Jazmyne Beauchesne D. Mina Marble, PharmD, BCPS Pager:  [phone] 08/10/2014, 6:48 PM

## 2014-08-10 NOTE — Procedures (Signed)
PV/SMV lysis check Int - Angiojet and pigtail twirl technique. Find - Markedly improved. PV thrombus nearly resolved. Chronic thromb [place] in SMV noted. Several collaterals are now widely patent. Tract - embolized with several metal coils and gelfoam. Comp - none

## 2014-08-10 NOTE — Progress Notes (Signed)
ANTICOAGULATION CONSULT NOTE - Follow Up Consult  Pharmacy Consult for heparin Indication: mesenteric vein thrombosis  Allergies  Allergen Reactions  . Ace Inhibitors Cough  . Lipitor [Atorvastatin] Other (See Comments)    myalgia  . Codeine Itching    Patient Measurements: Height: 6\' 2"  (188 cm) Weight: (!) 307 lb 12.2 oz (139.6 kg) IBW/kg (Calculated) : 82.2 Heparin Dosing Weight: 113 kg  Vital Signs: Temp: 98.6 F (37 C) (01/07 0017) Temp Source: Axillary (01/07 0017) BP: 143/78 mmHg (01/06 2300) Pulse Rate: 96 (01/06 2300)  Labs:  Recent Labs  08/07/14 0610  08/08/14 1338  08/09/14 0442 08/09/14 0854 08/09/14 1720 08/09/14 2300  HGB 13.7  --  13.9  --  13.4 13.3 13.5 13.4  HCT 42.7  --  43.3  --  41.5 41.4 42.2 41.3  PLT 233  --  257  --  242 244 246 267  HEPARINUNFRC 0.37  < >  --   < > 0.61 0.66 0.29* 0.64  CREATININE 1.54*  --  1.42*  --  1.31  --   --   --   < > = values in this interval not displayed.  Estimated Creatinine Clearance: 101.5 mL/min (by C-G formula based on Cr of 1.31).   Assessment: 49 yr old male with mesenteric thrombosis and extensive DVT from small ileacolic branches into the MPV s/p lysis 1/5. Patient was taken to IR again today. Angiogram images showed large amount of thrombus remaining. Planning TPA for another day. Heparin level remain supra-therapeutic at 0.64. Per RN, no s/s of bleeding noticed. H/H and Plt remain stable.   Goal of Therapy:  Heparin level 0.3-0.5 units/ml (while on TPA) Monitor platelets by anticoagulation protocol: Yes   Plan:  -Decrease heparin to 2300 units/hr -F/u 0800 HL. Monitor for s/s of bleeding   Albertina Parr, PharmD., BCPS Clinical Pharmacist Pager [phone]

## 2014-08-10 NOTE — Sedation Documentation (Signed)
Patient denies pain and is resting comfortably.  

## 2014-08-10 NOTE — Sedation Documentation (Signed)
Dressing to right abdomen, C/D/I with no bleeding at this time

## 2014-08-10 NOTE — Progress Notes (Signed)
PCA documentation for 1600 not done because patient was in procedure.

## 2014-08-10 NOTE — Progress Notes (Signed)
Dylan [name]   DOB:07/26/1966   GX#:[id number]   CXK#:[id number]  Patient Care Team: Raelyn Number, MD as PCP - General (Internal Medicine)  Patient is off floor away for IR procedure, seen by my PA Subjective: Patient seen and examined. Abdominal pain has worsened due to distention,. bloating and cramping while on liquid diet, but denies nausea, vomiting or diarrhea. Lysis is continuing as per radiology. Denies respiratory or cardiac complaints. His lower extremity swelling has increased today.  Denies any bleeding issues such as epistaxis, hematemesis, hematuria or hematochezia.   Scheduled Meds: . allopurinol  300 mg Oral Daily  . alteplase (tPA / ACTIVASE) PE Lysis 24 mg/500 mL (UNILATERAL)  0.5 mg/hr Intravenous Once  . antiseptic oral rinse  7 mL Mouth Rinse BID  . ciprofloxacin  400 mg Intravenous Q12H  . famotidine (PEPCID) IV  20 mg Intravenous Q12H  . HYDROmorphone PCA 0.3 mg/mL   Intravenous 6 times per day  . metoprolol succinate  50 mg Oral Daily  . metronidazole  500 mg Intravenous Q8H  . omega-3 acid ethyl esters  1 g Oral Daily  . sodium chloride  3 mL Intravenous Q12H   Continuous Infusions: . sodium chloride 125 mL/hr at 08/10/14 0122  . sodium chloride 35 mL/hr at 08/10/14 0611  . heparin 2,300 Units/hr (08/10/14 0610)   PRN Meds:sodium chloride, acetaminophen **OR** acetaminophen, diphenhydrAMINE **OR** diphenhydrAMINE, naloxone **AND** sodium chloride, nitroGLYCERIN, ondansetron **OR** ondansetron (ZOFRAN) IV, ondansetron (ZOFRAN) IV, promethazine, sodium chloride   Objective:  Filed Vitals:   08/10/14 0600  BP: 143/92  Pulse: 95  Temp:   Resp: 13      Intake/Output Summary (Last 24 hours) at 08/10/14 0750 Last data filed at 08/10/14 0600  Gross per 24 hour  Intake 5196.35 ml  Output   1850 ml  Net 3346.35 ml    GENERAL:alert, no distress and uncomfortable. He is morbidly obese SKIN: skin color, texture, turgor are normal, no rashes or  significant lesions EYES: normal, conjunctiva are pink and non-injected, sclera clear OROPHARYNX: no exudate, no erythema and lips, buccal mucosa, and tongue normal  NECK: supple, thyroid normal size, non-tender, without nodularity LYMPH: no palpable lymphadenopathy in the cervical, axillary or inguinal LUNGS: clear to auscultation and percussion with normal breathing effort HEART: regular rate & rhythm and no murmurs and  2+ bilateral lower extremity edema ABDOMEN: abdomen obese, distended, diffuse tenderness and decreased bowel sounds. He has a transhepatic catheter.   Musculoskeletal: no cyanosis of digits and no clubbing  PSYCH: alert & oriented x 3 with fluent speech NEURO: no focal motor/sensory deficits    CBG (last 3)   Recent Labs  08/09/14 1159 08/09/14 1937 08/10/14 0501  GLUCAP 102* 113* 90     Labs:   Recent Labs Lab 08/04/14 0412  08/09/14 0442 08/09/14 0854 08/09/14 1720 08/09/14 2300 08/10/14 0500  WBC 11.6*  < > 6.8 7.7 7.5 7.4 7.7  HGB 14.7  < > 13.4 13.3 13.5 13.4 13.0  HCT 46.9  < > 41.5 41.4 42.2 41.3 40.3  PLT 177  < > 242 244 246 267 267  MCV 92.0  < > 92.8 91.2 91.7 91.0 90.8  MCH 28.8  < > 30.0 29.3 29.3 29.5 29.3  MCHC 31.3  < > 32.3 32.1 32.0 32.4 32.3  RDW 14.6  < > 14.9 14.7 15.0 14.7 14.8  LYMPHSABS 4.7*  --   --   --   --   --   --  MONOABS 0.9  --   --   --   --   --   --   EOSABS 0.1  --   --   --   --   --   --   BASOSABS 0.1  --   --   --   --   --   --   < > = values in this interval not displayed.   Chemistries:    Recent Labs Lab 08/04/14 0412  08/06/14 0533 08/07/14 0610 08/08/14 1338 08/09/14 0442 08/10/14 0500  NA 131*  < > 133* 132* 134* 137 136  K 4.0  < > 4.7 4.4 4.1 4.2 4.7  CL 92*  < > 99 99 104 107 105  CO2 28  < > 28 30 23 24 26   GLUCOSE 136*  < > 126* 122* 106* 91 104*  BUN 45*  < > 52* 35* 24* 23 18  CREATININE 2.77*  < > 2.00* 1.54* 1.42* 1.31 1.16  CALCIUM 9.2  < > 6.8* 7.2* 7.7* 7.5* 7.8*  MG   --   --   --   --   --   --  1.7  AST 34  --   --   --  124* 187* 138*  ALT 39  --   --   --  71* 100* 108*  ALKPHOS 69  --   --   --  57 58 66  BILITOT 0.9  --   --   --  0.6 1.0 1.0  < > = values in this interval not displayed.  GFR Estimated Creatinine Clearance: 114.6 mL/min (by C-G formula based on Cr of 1.16).  Liver Function Tests:  Recent Labs Lab 08/04/14 0412 08/08/14 1338 08/09/14 0442 08/10/14 0500  AST 34 124* 187* 138*  ALT 39 71* 100* 108*  ALKPHOS 69 57 58 66  BILITOT 0.9 0.6 1.0 1.0  PROT 7.0 5.2* 4.5* 4.7*  ALBUMIN 3.6 2.3* 2.1* 2.2*    Recent Labs Lab 08/04/14 0412  LIPASE 59   No results for input(s): AMMONIA in the last 168 hours.  Urine Studies     Component Value Date/Time   COLORURINE AMBER* 08/08/2014 [place] 08/08/2014 1204   LABSPEC 1.023 08/08/2014 1204   PHURINE 5.0 08/08/2014 1204   GLUCOSEU NEGATIVE 08/08/2014 1204   HGBUR NEGATIVE 08/08/2014 1204   BILIRUBINUR NEGATIVE 08/08/2014 1204   KETONESUR 15* 08/08/2014 1204   PROTEINUR NEGATIVE 08/08/2014 1204   UROBILINOGEN 0.2 08/08/2014 1204   NITRITE NEGATIVE 08/08/2014 1204   LEUKOCYTESUR SMALL* 08/08/2014 1204    Coagulation profile  Recent Labs Lab 08/05/14 1312  INR 1.21     Imaging Studies:  None today  Assessment/Plan: 49 y.o.  1. 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 He underwent transhepatic superior mesenteric vein / portal vein (SMV/PV) lysis by interventional radiology on 08/08/14 with good response. He is currently on tPA and heparin, with large clot burden. To repeat imaging today.  At this point in time, we reinforced the importance of avoiding further replacement due to recent life threatening event. I will return to discuss  anticoagulation treatment options with him tomorrow. In the mean time, continue IV heparin  Mesenteric Ischemia Partial Small Bowel Obstruction This is likely due to superior mesenteric vein thrombosis Improving after tPA  infusion CEA taken on admission was 0.6 and CA 19.9 was 15.1, normal values He has increasing abdominal pain and distention today, for which he is back to NPO- from clear diet, No surgical intervention is indicated as per Surgery notes  History of CVA This was after a catheterization and stent in 2011 No significant residual noted. Patient is on anticoagulation  Transaminemia In the setting of alcohol habituation and portal vein thrombosis GI workup ongoing. Hepatitis panel pending  Acute kidney injury Likely due to acute tubular necrosis in the setting of small bowel obstruction Resolved with IV fluids, managed by primary team.  Bilateral lower extremity edema In the setting of low albumin, ascites, portal vein thrombosis Ultrasound of lower extremities is negative for DVT  Other medical issues including cardiac disease, hypercholesterolemia, as per primary team and specialties. On both days yesterday and today, the patient was off floor. I saw him late last night after the procedure. Overall his medical condition is stable will return to see him tomorrow  **Disclaimer: This note was dictated with voice recognition software. Similar sounding words can inadvertently be transcribed and this note may contain transcription errors which may not have been corrected upon publication of note.Sharene Butters E, PA-C 08/10/2014  7:50 AM Lenzie Montesano, MD 08/10/2014

## 2014-08-10 NOTE — Progress Notes (Signed)
PULMONARY / CRITICAL CARE MEDICINE   Name: Dylan [name] MRN: [id number] DOB: 1965/12/25    ADMISSION DATE:  08/04/2014 CONSULTATION DATE:  08/10/2014  REFERRING MD :  Tat  CHIEF COMPLAINT:  Abd pain  INITIAL PRESENTATION:  49 y.o. M with 4 days diffuse abd pain, found to have small bowel enteritis along with occlusive thrombus in SMV extending through main portal vein.  Underwent lysis by IR on 08/08/14 and returned to ICU on tPA infusion.  STUDIES:  CT Abd 01/01 >>> several loops of abnormal enlarged thick walled small bowel loops with surrounding inflammation and fluid in RLQ. US Renal 01/02 >>> prior small bowel wall thickening in RLQ is attributed to likely SMV thrombosis.  Small amt of ascites adjacent to the right hepatic lobe. Complex lesion of left mid kidney. MRI 01/02 >>> Occlusive thrombus in SMV extending into main portal vein.  Abdominal ascites, left renal cyst. CT abd 01/04 >>> persistent small bowel thickening. Increased small bowel dilatation suspicious for SBO, increase in diffuse mesenteric edema and ascites.  SIGNIFICANT EVENTS: 01/01 - admit 01/05 - lysis by IR, placed on tPA infusion 01/06 - PCCM consulted to take over as primary team, repeat eval by IR, large thrombus remaining, tPA to continue for another 24 hours  SUBJECTIVE:  abdo distention  VITAL SIGNS: Temp:  [98.4 F (36.9 C)-99.1 F (37.3 C)] 98.6 F (37 C) (01/07 0436) Pulse Rate:  [86-110] 95 (01/07 0600) Resp:  [12-33] 13 (01/07 0600) BP: (116-158)/(68-109) 143/92 mmHg (01/07 0600) SpO2:  [88 %-97 %] 94 % (01/07 0600) HEMODYNAMICS:   VENTILATOR SETTINGS:   INTAKE / OUTPUT: Intake/Output      01/06 0701 - 01/07 0700 01/07 0701 - 01/08 0700   I.V. (mL/kg) 4396.4 (31.5)    IV Piggyback 800    Total Intake(mL/kg) 5196.4 (37.2)    Urine (mL/kg/hr) 1850 (0.6)    Total Output 1850     Net +3346.4            PHYSICAL EXAMINATION: General: lying in bed, mild distress due to abdominal  pain Neuro: A&O x 3, non-focal.  HEENT: EOMI Cardiovascular: RR Lungs: Respirations even and unlabored.  CTA bilaterally anteriorly Abdomen: distended, non-tender to light palpation, occasional bowel sounds.  tPA catheter in RUQ. Musculoskeletal: 2+ pitting edema. Skin: Intact, warm, no rashes.  LABS:  CBC  Recent Labs Lab 08/09/14 1720 08/09/14 2300 08/10/14 0500  WBC 7.5 7.4 7.7  HGB 13.5 13.4 13.0  HCT 42.2 41.3 40.3  PLT 246 267 267   Coag's  Recent Labs Lab 08/05/14 1312  APTT 28  INR 1.21   BMET  Recent Labs Lab 08/08/14 1338 08/09/14 0442 08/10/14 0500  NA 134* 137 136  K 4.1 4.2 4.7  CL 104 107 105  CO2 23 24 26   BUN 24* 23 18  CREATININE 1.42* 1.31 1.16  GLUCOSE 106* 91 104*   Electrolytes  Recent Labs Lab 08/08/14 1338 08/09/14 0442 08/10/14 0500  CALCIUM 7.7* 7.5* 7.8*  MG  --   --  1.7  PHOS  --   --  3.0   Sepsis Markers  Recent Labs Lab 08/07/14 0610 08/08/14 1401 08/09/14 0429  LATICACIDVEN 1.0 1.2 1.1   Liver Enzymes  Recent Labs Lab 08/08/14 1338 08/09/14 0442 08/10/14 0500  AST 124* 187* 138*  ALT 71* 100* 108*  ALKPHOS 57 58 66  BILITOT 0.6 1.0 1.0  ALBUMIN 2.3* 2.1* 2.2*   Glucose  Recent Labs Lab 08/08/14 1921  08/09/14 0447 08/09/14 0821 08/09/14 1159 08/09/14 1937 08/10/14 0501  GLUCAP 86 91 76 102* 113* 90   Imaging Ir Jacolyn Reedy F/u Eval Art/ven Alwyn Ren Day (ms)  08/09/2014   CLINICAL DATA:  49 year old with SMV and portal vein thrombus and concern for bowel ischemia. Catheter-directed thrombolytic therapy was started on 08/08/2014. Patient returns for follow-up angiography.  EXAM: THROMBOLYTIC FOLLOW-UP ANGIOGRAPHY  Physician: Dylan Minister. Henn, MD  FLUOROSCOPY TIME:  6 min and 18 seconds, 1090 mGy  MEDICATIONS: 2 mg Versed, 100 mcg fentanyl. A radiology nurse monitored the patient for moderate sedation.  ANESTHESIA/SEDATION: Moderate sedation time: 60 min  PROCEDURE: Patient was placed supine. The existing  catheters in the right lateral abdomen were prepped and draped in sterile fashion. Maximal barrier sterile technique was utilized including caps, mask, sterile gowns, sterile gloves, sterile drape, hand hygiene and skin antiseptic. Contrast was initially injected through the infusion wire port. Subsequently, the infusion wire was removed and contrast was injected through the infusion catheter. These initial angiographic images were limited. As a result, the infusion catheter was removed for a Kumpe catheter over a Bentson wire. Additional angiography was performed. A new 135 cm infusion catheter with 20 cm of infusion length was advanced over a Rosen wire. Tip was placed in the right lower quadrant and similar placed from the previous study. Catheters were secured to the patient. IV heparin and catheter-directed tPA was again started. TPA was started at 0.5 mg/hour.  FINDINGS: There is hepatopetal flow in the SMV but there continues to be a large amount of thrombus throughout the dilated SMV. There is flow within the main portal vein and the proximal left and right portal veins. Suspect nonocclusive clot in the main portal vein.  Multiple dilated loops of small bowel throughout the abdomen  COMPLICATIONS: None  IMPRESSION: There is flow in the superior mesenteric vein but there continues to be a large amount of irregular thrombus. As a result, a new infusion catheter was placed. Will continue catheter-directed thrombolytic therapy overnight with IV heparin. Plan to have the patient return to Interventional Radiology on 08/10/2014 for follow-up angiography and possibly mechanical thrombectomy.  Patient continues to have multiple dilated loops of small bowel throughout the abdomen. The patient's symptoms are improving with catheter-directed thrombolytic therapy but the radiographic appearance of the abdomen is still concerning for a bowel obstruction.   Electronically Signed   By: Dylan [name] M.D.   On: 08/09/2014  18:16   ASSESSMENT / PLAN:  PULMONARY A: Taking shallow respirations due to abdominal distension ATX risk  P:   No interventions required If O2 needed, add pcxr   CARDIOVASCULAR A:  CAD s/p PCI 2013 Hx HTN, HLD P:  Continue aspirin, beta blocker See heme Pos balance  RENAL A:   AKI - resolving  Recent Labs Lab 08/06/14 0533 08/07/14 0610 08/08/14 1338 08/09/14 0442 08/10/14 0500  CREATININE 2.00* 1.54* 1.42* 1.31 1.16   P:   NS @ 125. Trend bmet Replace mag Keep k greater 4, mag greater 2  GASTROINTESTINAL A:   SBO--still not passing gas or BM, feels like distension worse but tolerated clears Small bowel enteritis Transaminitis - likely due to ETOH as well as portal vein thrombosis--trending down Nutrition Bowel congention from thrombosis P:   CCS following Pepcid. Ice chips, advance per CCS recs NGT with IR today as well (patient does not tolerate at bedside) Tolerating clear liquids KUB assessment  HEMATOLOGIC A:   PV and SMV thrombosis - s/p lysis  by IR and tPA infusion Hypercoagulable state 49 yr old P:  Continue tPA and heparin per IR--will go for re-eval today Hematology following. SCD's CBC in AM-Hb stable Work up borderline approaching age 66 as far as acquired or congential states Will order pt 20210 gene mutation,ana, esr, homocysteine level, factor 5 leiden  INFECTIOUS A:   Small bowel enteritis P:   Day 7 of abx Cipro 1/1>>> Flagyl 1/1>>>  Pct algo to dc abx  ENDOCRINE A:   Prediabetes P:   No interventions required. Lower glu sticks, not needed  NEUROLOGIC A:   Pain Hx CVA s/p cardiac cath 2011 P:   Continue Dilaudid PCA.  Family updated: Wife updated yesterday  Interdisciplinary Family Meeting v Palliative Care Meeting:  N/A.  Signed: Jerene Pitch, MD PGY-3, Internal Medicine Resident Pager: [phone]  08/10/2014,7:25 AM   STAFF NOTE: Linwood Dibbles, MD FACP have personally reviewed patient's  available data, including medical history, events of note, physical examination and test results as part of my evaluation. I have discussed with resident/NP and other care providers such as pharmacist, RN and RRT. In addition, I personally evaluated patient and elicited key findings of: for NGT, for IR, get kub, order some work up as able for hypercoagulable state, surgery following, distended, no distress  Lavon Paganini. Titus Mould, MD, [place] Pgr: [facility] 08/10/2014 9:20 AM

## 2014-08-10 NOTE — Progress Notes (Signed)
Called to IR spoke with nurse concerning patient IV irritation.  Advised per Dr. Barbie Banner to stop TPA and start a new PIV. IV team consult placed, patient advised of orders.  Will continue to monitor closely.

## 2014-08-10 NOTE — Progress Notes (Signed)
[facility] Progress Note Subjective: Reports increased lower abdominal discomfort over past 12-24 hours. Tolerating clears. No N/V.  Last BM 08/09/2014. No issues with UO.   Objective: Vital signs in last 24 hours: mild tachycardia over night, afebrile Temp:  [98.4 F (36.9 C)-99.1 F (37.3 C)] 98.6 F (37 C) (01/07 0436) Pulse Rate:  [94-110] 103 (01/07 0700) Resp:  [12-33] 16 (01/07 0700) BP: (116-158)/(70-109) 156/104 mmHg (01/07 0700) SpO2:  [88 %-97 %] 91 % (01/07 0700) Last BM Date: 08/08/14  Intake/Output from previous day: 01/06 0701 - 01/07 0700 In: 5389.8 [I.V.:4589.8; IV Piggyback:800] Out: 1850 [Urine:1850] Intake/Output this shift:    General appearance: alert, cooperative and no distress Resp: clear to auscultation bilaterally Cardio: regular rate and rhythm and HR 91 at time of exam GI: soft, distended, generalized tenderness across lower portion of abdomen, transhepatic catheter in place  Lab Results:   Recent Labs  08/09/14 2300 08/10/14 0500  WBC 7.4 7.7  HGB 13.4 13.0  HCT 41.3 40.3  PLT 267 267   BMET  Recent Labs  08/09/14 0442 08/10/14 0500  NA 137 136  K 4.2 4.7  CL 107 105  CO2 24 26  GLUCOSE 91 104*  BUN 23 18  CREATININE 1.31 1.16  CALCIUM 7.5* 7.8*   PT/INR No results for input(s): LABPROT, INR in the last 72 hours. ABG No results for input(s): PHART, HCO3 in the last 72 hours.  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  EXAM: IR ULTRASOUND GUIDANCE VASC ACCESS RIGHT; THROMBOECTOMY MECHANICAL VENOUS; IR INFUSION THROMBOL VENOUS INITIAL (MS); PORTAL VENOGRAPHY; ADDITIONAL ARTERIOGRAPHY  FLUOROSCOPY TIME:  8 min and 6 seconds.  MEDICATIONS AND MEDICAL HISTORY: Versed 4 mg, Fentanyl 175 mcg.  As antibiotic prophylaxis, Ancef was ordered pre-procedure and administered  intravenously within one hour of incision.  ANESTHESIA/SEDATION: Moderate sedation time: 52 minutes  CONTRAST:  50 cc Omnipaque 300.  Carbon dioxide.  PROCEDURE: The procedure, risks, benefits, and alternatives were explained to the patient. Questions regarding the procedure were encouraged and answered. The patient understands and consents to the procedure.  The right flank was prepped with Betadine in a sterile fashion, and a sterile drape was applied covering the operative field. A sterile gown and sterile gloves were used for the procedure.  1% lidocaine was utilized for local infiltration. Under sonographic guidance, a 22 gauge Chiba needle was inserted into the peripheral aspect of the main right portal vein. Contrast was injected confirming needle position. The needle was removed over a 018 wire. The Accustick dilator was inserted. A small amount of contrast was injected. Carbon dioxide portal venography was performed.  The Accustick dilator was exchanged over the 3 J for a 5 [place] Kumpe the catheter. This was advanced into the lower portal vein and venography was performed. The catheter was advanced into the upper SMV and contrast was injected. There is partially occlusive thrombus in the main portal vein and occlusive thrombus in the SMV.  The catheter was carefully advanced over a glidewire to the small venous branches of the distal ileum in the right lower quadrant. Contrast was injected demonstrating stasis of flow within the small branches adjacent to the intestines and complete occlusion within the larger more central branches.  The Kumpe the catheter was removed over a Rosen wire. A 6 French sheath was advanced over the wire to the  upper main portal vein. The Angiojet was utilized to mechanically lysed the thrombus for 96 seconds.  Subsequently, a 90 cm 20 cm infusion length 5 French multi side-hole catheter was advanced over the [place] wire to the right lower quadrant. The catheter is positioned  along side the thrombus extending from the main portal vein to the ileum. Lysis with 0.5 mg tPA was then instituted. Heparin protocol was resumed per peripheral access.  FINDINGS: Portal venography confirms partial occlusion of the main portal vein and complete occlusion of the SMV. Splenic vein is patent.  Subsequent venogram images confirmed occlusion of mesenteric brain branches to the right lower quadrant. Peripheral branches adjacent to the intestine are patent with stasis of flow.  The neck series of images demonstrate placement of a 6 French sheath and multi side hole catheter along side the thrombus, extending from the main portal vein to the small mesenteric venous branches in the right lower quadrant.  COMPLICATIONS: None  IMPRESSION: Successful mechanical and pharmacological lysis of thrombus in the main portal vein, superior mesenteric vein, and venous branches in the right lower quadrant. TPA had 0.5 milligrams/hour will be instituted via multi side-hole catheter along side the thrombus.   Electronically Signed   By: Maryclare Bean M.D.   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  EXAM: IR ULTRASOUND GUIDANCE VASC ACCESS RIGHT; THROMBOECTOMY MECHANICAL VENOUS; IR INFUSION THROMBOL VENOUS INITIAL (MS); PORTAL VENOGRAPHY; ADDITIONAL ARTERIOGRAPHY  FLUOROSCOPY TIME:  8 min and 6 seconds.  MEDICATIONS AND MEDICAL HISTORY: Versed 4 mg, Fentanyl 175 mcg.  As antibiotic prophylaxis, Ancef was ordered pre-procedure and administered intravenously within one hour of incision.  ANESTHESIA/SEDATION: Moderate sedation time: 52 minutes  CONTRAST:  50 cc Omnipaque 300.  Carbon dioxide.  PROCEDURE: The procedure, risks, benefits, and alternatives were explained to the patient. Questions regarding the procedure were encouraged and answered. The patient understands and consents to the procedure.  The right flank  was prepped with Betadine in a sterile fashion, and a sterile drape was applied covering the operative field. A sterile gown and sterile gloves were used for the procedure.  1% lidocaine was utilized for local infiltration. Under sonographic guidance, a 22 gauge Chiba needle was inserted into the peripheral aspect of the main right portal vein. Contrast was injected confirming needle position. The needle was removed over a 018 wire. The Accustick dilator was inserted. A small amount of contrast was injected. Carbon dioxide portal venography was performed.  The Accustick dilator was exchanged over the 3 J for a 5 [place] Kumpe the catheter. This was advanced into the lower portal vein and venography was performed. The catheter was advanced into the upper SMV and contrast was injected. There is partially occlusive thrombus in the main portal vein and occlusive thrombus in the SMV.  The catheter was carefully advanced over a glidewire to the small venous branches of the distal ileum in the right lower quadrant. Contrast was injected demonstrating stasis of flow within the small branches adjacent to the intestines and complete occlusion within the larger more central branches.  The Kumpe the catheter was removed over a Rosen wire. A 6 French sheath was advanced over the wire to the upper main portal vein. The Angiojet was utilized to mechanically lysed the thrombus for 96 seconds.  Subsequently, a 90 cm 20 cm infusion length 5 French multi side-hole catheter was advanced  over the [place] wire to the right lower quadrant. The catheter is positioned along side the thrombus extending from the main portal vein to the ileum. Lysis with 0.5 mg tPA was then instituted. Heparin protocol was resumed per peripheral access.  FINDINGS: Portal venography confirms partial occlusion of the main portal vein and complete occlusion of the SMV. Splenic vein is patent.  Subsequent venogram images confirmed occlusion of mesenteric brain  branches to the right lower quadrant. Peripheral branches adjacent to the intestine are patent with stasis of flow.  The neck series of images demonstrate placement of a 6 French sheath and multi side hole catheter along side the thrombus, extending from the main portal vein to the small mesenteric venous branches in the right lower quadrant.  COMPLICATIONS: None  IMPRESSION: Successful mechanical and pharmacological lysis of thrombus in the main portal vein, superior mesenteric vein, and venous branches in the right lower quadrant. TPA had 0.5 milligrams/hour will be instituted via multi side-hole catheter along side the thrombus.   Electronically Signed   By: Maryclare Bean M.D.   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  EXAM: IR ULTRASOUND GUIDANCE VASC ACCESS RIGHT; THROMBOECTOMY MECHANICAL VENOUS; IR INFUSION THROMBOL VENOUS INITIAL (MS); PORTAL VENOGRAPHY; ADDITIONAL ARTERIOGRAPHY  FLUOROSCOPY TIME:  8 min and 6 seconds.  MEDICATIONS AND MEDICAL HISTORY: Versed 4 mg, Fentanyl 175 mcg.  As antibiotic prophylaxis, Ancef was ordered pre-procedure and administered intravenously within one hour of incision.  ANESTHESIA/SEDATION: Moderate sedation time: 52 minutes  CONTRAST:  50 cc Omnipaque 300.  Carbon dioxide.  PROCEDURE: The procedure, risks, benefits, and alternatives were explained to the patient. Questions regarding the procedure were encouraged and answered. The patient understands and consents to the procedure.  The right flank was prepped with Betadine in a sterile fashion, and a sterile drape was applied covering the operative field. A sterile gown and sterile gloves were used for the procedure.  1% lidocaine was utilized for local infiltration. Under sonographic guidance, a 22 gauge Chiba needle was inserted into the peripheral aspect of the main right portal vein. Contrast was injected confirming  needle position. The needle was removed over a 018 wire. The Accustick dilator was inserted. A small amount of contrast was injected. Carbon dioxide portal venography was performed.  The Accustick dilator was exchanged over the 3 J for a 5 [place] Kumpe the catheter. This was advanced into the lower portal vein and venography was performed. The catheter was advanced into the upper SMV and contrast was injected. There is partially occlusive thrombus in the main portal vein and occlusive thrombus in the SMV.  The catheter was carefully advanced over a glidewire to the small venous branches of the distal ileum in the right lower quadrant. Contrast was injected demonstrating stasis of flow within the small branches adjacent to the intestines and complete occlusion within the larger more central branches.  The Kumpe the catheter was removed over a Rosen wire. A 6 French sheath was advanced over the wire to the upper main portal vein. The Angiojet was utilized to mechanically lysed the thrombus for 96 seconds.  Subsequently, a 90 cm 20 cm infusion length 5 French multi side-hole catheter was advanced over the [place] wire to the right lower quadrant. The catheter is positioned along side the thrombus extending from the main portal vein to the ileum. Lysis with 0.5 mg tPA  was then instituted. Heparin protocol was resumed per peripheral access.  FINDINGS: Portal venography confirms partial occlusion of the main portal vein and complete occlusion of the SMV. Splenic vein is patent.  Subsequent venogram images confirmed occlusion of mesenteric brain branches to the right lower quadrant. Peripheral branches adjacent to the intestine are patent with stasis of flow.  The neck series of images demonstrate placement of a 6 French sheath and multi side hole catheter along side the thrombus, extending from the main portal vein to the small mesenteric venous branches in the right lower quadrant.  COMPLICATIONS: None  IMPRESSION:  Successful mechanical and pharmacological lysis of thrombus in the main portal vein, superior mesenteric vein, and venous branches in the right lower quadrant. TPA had 0.5 milligrams/hour will be instituted via multi side-hole catheter along side the thrombus.   Electronically Signed   By: Maryclare Bean M.D.   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  EXAM: IR ULTRASOUND GUIDANCE VASC ACCESS RIGHT; THROMBOECTOMY MECHANICAL VENOUS; IR INFUSION THROMBOL VENOUS INITIAL (MS); PORTAL VENOGRAPHY; ADDITIONAL ARTERIOGRAPHY  FLUOROSCOPY TIME:  8 min and 6 seconds.  MEDICATIONS AND MEDICAL HISTORY: Versed 4 mg, Fentanyl 175 mcg.  As antibiotic prophylaxis, Ancef was ordered pre-procedure and administered intravenously within one hour of incision.  ANESTHESIA/SEDATION: Moderate sedation time: 52 minutes  CONTRAST:  50 cc Omnipaque 300.  Carbon dioxide.  PROCEDURE: The procedure, risks, benefits, and alternatives were explained to the patient. Questions regarding the procedure were encouraged and answered. The patient understands and consents to the procedure.  The right flank was prepped with Betadine in a sterile fashion, and a sterile drape was applied covering the operative field. A sterile gown and sterile gloves were used for the procedure.  1% lidocaine was utilized for local infiltration. Under sonographic guidance, a 22 gauge Chiba needle was inserted into the peripheral aspect of the main right portal vein. Contrast was injected confirming needle position. The needle was removed over a 018 wire. The Accustick dilator was inserted. A small amount of contrast was injected. Carbon dioxide portal venography was performed.  The Accustick dilator was exchanged over the 3 J for a 5 [place] Kumpe the catheter. This was advanced into the lower portal vein and venography was performed. The catheter was advanced into the  upper SMV and contrast was injected. There is partially occlusive thrombus in the main portal vein and occlusive thrombus in the SMV.  The catheter was carefully advanced over a glidewire to the small venous branches of the distal ileum in the right lower quadrant. Contrast was injected demonstrating stasis of flow within the small branches adjacent to the intestines and complete occlusion within the larger more central branches.  The Kumpe the catheter was removed over a Rosen wire. A 6 French sheath was advanced over the wire to the upper main portal vein. The Angiojet was utilized to mechanically lysed the thrombus for 96 seconds.  Subsequently, a 90 cm 20 cm infusion length 5 French multi side-hole catheter was advanced over the [place] wire to the right lower quadrant. The catheter is positioned along side the thrombus extending from the main portal vein to the ileum. Lysis with 0.5 mg tPA was then instituted. Heparin protocol was resumed per peripheral access.  FINDINGS: Portal venography confirms partial occlusion of the main portal vein and complete occlusion of the SMV. Splenic vein is  patent.  Subsequent venogram images confirmed occlusion of mesenteric brain branches to the right lower quadrant. Peripheral branches adjacent to the intestine are patent with stasis of flow.  The neck series of images demonstrate placement of a 6 French sheath and multi side hole catheter along side the thrombus, extending from the main portal vein to the small mesenteric venous branches in the right lower quadrant.  COMPLICATIONS: None  IMPRESSION: Successful mechanical and pharmacological lysis of thrombus in the main portal vein, superior mesenteric vein, and venous branches in the right lower quadrant. TPA had 0.5 milligrams/hour will be instituted via multi side-hole catheter along side the thrombus.   Electronically Signed   By: Maryclare Bean M.D.   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  EXAM: IR ULTRASOUND GUIDANCE VASC ACCESS RIGHT; THROMBOECTOMY MECHANICAL VENOUS; IR INFUSION THROMBOL VENOUS INITIAL (MS); PORTAL VENOGRAPHY; ADDITIONAL ARTERIOGRAPHY  FLUOROSCOPY TIME:  8 min and 6 seconds.  MEDICATIONS AND MEDICAL HISTORY: Versed 4 mg, Fentanyl 175 mcg.  As antibiotic prophylaxis, Ancef was ordered pre-procedure and administered intravenously within one hour of incision.  ANESTHESIA/SEDATION: Moderate sedation time: 52 minutes  CONTRAST:  50 cc Omnipaque 300.  Carbon dioxide.  PROCEDURE: The procedure, risks, benefits, and alternatives were explained to the patient. Questions regarding the procedure were encouraged and answered. The patient understands and consents to the procedure.  The right flank was prepped with Betadine in a sterile fashion, and a sterile drape was applied covering the operative field. A sterile gown and sterile gloves were used for the procedure.  1% lidocaine was utilized for local infiltration. Under sonographic guidance, a 22 gauge Chiba needle was inserted into the peripheral aspect of the main right portal vein. Contrast was injected confirming needle position. The needle was removed over a 018 wire. The Accustick dilator was inserted. A small amount of contrast was injected. Carbon dioxide portal venography was performed.  The Accustick dilator was exchanged over the 3 J for a 5 [place] Kumpe the catheter. This was advanced into the lower portal vein and venography was performed. The catheter was advanced into the upper SMV and contrast was injected. There is partially occlusive thrombus in the main portal vein and occlusive thrombus in the SMV.  The catheter was carefully advanced over a glidewire to the small venous branches of the distal ileum in the right lower quadrant. Contrast was injected demonstrating stasis of flow within the small branches adjacent to the  intestines and complete occlusion within the larger more central branches.  The Kumpe the catheter was removed over a Rosen wire. A 6 French sheath was advanced over the wire to the upper main portal vein. The Angiojet was utilized to mechanically lysed the thrombus for 96 seconds.  Subsequently, a 90 cm 20 cm infusion length 5 French multi side-hole catheter was advanced over the [place] wire to the right lower quadrant. The catheter is positioned along side the thrombus extending from the main portal vein to the ileum. Lysis with 0.5 mg tPA was then instituted. Heparin protocol was resumed per peripheral access.  FINDINGS: Portal venography confirms partial occlusion of the main portal vein and complete occlusion of the SMV. Splenic vein is patent.  Subsequent venogram images confirmed occlusion of mesenteric brain branches to the right lower quadrant. Peripheral branches adjacent to the intestine are patent with stasis of flow.  The neck  series of images demonstrate placement of a 6 French sheath and multi side hole catheter along side the thrombus, extending from the main portal vein to the small mesenteric venous branches in the right lower quadrant.  COMPLICATIONS: None  IMPRESSION: Successful mechanical and pharmacological lysis of thrombus in the main portal vein, superior mesenteric vein, and venous branches in the right lower quadrant. TPA had 0.5 milligrams/hour will be instituted via multi side-hole catheter along side the thrombus.   Electronically Signed   By: Maryclare Bean M.D.   On: 08/08/2014 17:21   Ir Jacolyn Reedy F/u Eval Art/ven Alwyn Ren Day (ms)  08/09/2014   CLINICAL DATA:  49 year old with SMV and portal vein thrombus and concern for bowel ischemia. Catheter-directed thrombolytic therapy was started on 08/08/2014. Patient returns for follow-up angiography.  EXAM: THROMBOLYTIC FOLLOW-UP ANGIOGRAPHY  Physician: Stephan Minister. Henn, MD  FLUOROSCOPY TIME:  6 min and 18 seconds, 1090 mGy  MEDICATIONS: 2 mg Versed,  100 mcg fentanyl. A radiology nurse monitored the patient for moderate sedation.  ANESTHESIA/SEDATION: Moderate sedation time: 60 min  PROCEDURE: Patient was placed supine. The existing catheters in the right lateral abdomen were prepped and draped in sterile fashion. Maximal barrier sterile technique was utilized including caps, mask, sterile gowns, sterile gloves, sterile drape, hand hygiene and skin antiseptic. Contrast was initially injected through the infusion wire port. Subsequently, the infusion wire was removed and contrast was injected through the infusion catheter. These initial angiographic images were limited. As a result, the infusion catheter was removed for a Kumpe catheter over a [place] wire. Additional angiography was performed. A new 135 cm infusion catheter with 20 cm of infusion length was advanced over a Rosen wire. Tip was placed in the right lower quadrant and similar placed from the previous study. Catheters were secured to the patient. IV heparin and catheter-directed tPA was again started. TPA was started at 0.5 mg/hour.  FINDINGS: There is hepatopetal flow in the SMV but there continues to be a large amount of thrombus throughout the dilated SMV. There is flow within the main portal vein and the proximal left and right portal veins. Suspect nonocclusive clot in the main portal vein.  Multiple dilated loops of small bowel throughout the abdomen  COMPLICATIONS: None  IMPRESSION: There is flow in the superior mesenteric vein but there continues to be a large amount of irregular thrombus. As a result, a new infusion catheter was placed. Will continue catheter-directed thrombolytic therapy overnight with IV heparin. Plan to have the patient return to Interventional Radiology on 08/10/2014 for follow-up angiography and possibly mechanical thrombectomy.  Patient continues to have multiple dilated loops of small bowel throughout the abdomen. The patient's symptoms are improving with  catheter-directed thrombolytic therapy but the radiographic appearance of the abdomen is still concerning for a bowel obstruction.   Electronically Signed   By: Markus Daft M.D.   On: 08/09/2014 18:16    Anti-infectives: Anti-infectives    Start     Dose/Rate Route Frequency Ordered Stop   08/08/14 1418  ceFAZolin (ANCEF) IVPB 2 g/50 mL premix     2 g100 mL/hr over 30 Minutes Intravenous 60 min pre-op 08/08/14 1413 08/08/14 1530   08/04/14 0845  ciprofloxacin (CIPRO) IVPB 400 mg     400 mg200 mL/hr over 60 Minutes Intravenous Every 12 hours 08/04/14 0834     08/04/14 0845  metroNIDAZOLE (FLAGYL) IVPB 500 mg     500 mg100 mL/hr over 60 Minutes Intravenous Every 8 hours 08/04/14 0834  Assessment/Plan: s/p * No surgery found * 1. SMV thrombus: currently on heparin and catheter directed TPA. Repeat imaging from evening of 08/09/2014 still with large persistent clot burden. Per IR, patient will have re-imaging again today. H/H appears stable.  2. pSBO secondary to ischemic enteritis from #1. Abdomen a bit more distended today, but without peritoneal signs and no N/V. Fluoroscopy study from yesterday still with dilated loops of small bowel. Will hold diet at sips of clears as exam still consistent with pSBO. Will continue to follow closely.  3. AKI, resolved  LOS: 6 days    Lahoma Rocker, [facility] Pager [phone] 08/10/2014

## 2014-08-10 NOTE — Progress Notes (Signed)
Patient arrived to floor, nauseated. Antiemetic administered as per MD order.  Patient dressing on Right chest began to leak serosanguineous fluid (approx 365ml) Dr. Barbie Banner notified, pressure dressing applied as per MD recommendation. Oozing minimized post pressure dressing placement, will continue to monitor this patient very closely.

## 2014-08-11 ENCOUNTER — Inpatient Hospital Stay (HOSPITAL_COMMUNITY): Payer: BLUE CROSS/BLUE SHIELD

## 2014-08-11 DIAGNOSIS — R109 Unspecified abdominal pain: Secondary | ICD-10-CM

## 2014-08-11 LAB — PROCALCITONIN: Procalcitonin: 0.45 ng/mL

## 2014-08-11 LAB — BASIC METABOLIC PANEL
ANION GAP: 6 (ref 5–15)
ANION GAP: 6 (ref 5–15)
BUN: 16 mg/dL (ref 6–23)
BUN: 16 mg/dL (ref 6–23)
CALCIUM: 7.9 mg/dL — AB (ref 8.4–10.5)
CHLORIDE: 101 meq/L (ref 96–112)
CO2: 27 mmol/L (ref 19–32)
CO2: 29 mmol/L (ref 19–32)
CREATININE: 1.14 mg/dL (ref 0.50–1.35)
Calcium: 8.1 mg/dL — ABNORMAL LOW (ref 8.4–10.5)
Chloride: 102 mEq/L (ref 96–112)
Creatinine, Ser: 1.33 mg/dL (ref 0.50–1.35)
GFR calc Af Amer: 86 mL/min — ABNORMAL LOW (ref >90)
GFR calc Af Amer: 71 mL/min — ABNORMAL LOW (ref >90)
GFR calc non Af Amer: 61 mL/min — ABNORMAL LOW (ref >90)
GFR, EST NON AFRICAN AMERICAN: 74 mL/min — AB (ref >90)
Glucose, Bld: 121 mg/dL — ABNORMAL HIGH (ref 70–99)
Glucose, Bld: 109 mg/dL — ABNORMAL HIGH (ref 70–99)
Potassium: 4.1 mmol/L (ref 3.5–5.1)
Potassium: 3.7 mmol/L (ref 3.5–5.1)
SODIUM: 135 mmol/L (ref 135–145)
Sodium: 136 mmol/L (ref 135–145)

## 2014-08-11 LAB — CBC
HCT: 40.5 % (ref 39.0–52.0)
HCT: 38.7 % — ABNORMAL LOW (ref 39.0–52.0)
Hemoglobin: 12.9 g/dL — ABNORMAL LOW (ref 13.0–17.0)
Hemoglobin: 12.7 g/dL — ABNORMAL LOW (ref 13.0–17.0)
MCH: 28.7 pg (ref 26.0–34.0)
MCH: 29.4 pg (ref 26.0–34.0)
MCHC: 31.9 g/dL (ref 30.0–36.0)
MCHC: 32.8 g/dL (ref 30.0–36.0)
MCV: 90.2 fL (ref 78.0–100.0)
MCV: 89.6 fL (ref 78.0–100.0)
Platelets: 244 10*3/uL (ref 150–400)
Platelets: 251 10*3/uL (ref 150–400)
RBC: 4.49 MIL/uL (ref 4.22–5.81)
RBC: 4.32 MIL/uL (ref 4.22–5.81)
RDW: 14.9 % (ref 11.5–15.5)
RDW: 14.7 % (ref 11.5–15.5)
WBC: 6.7 10*3/uL (ref 4.0–10.5)
WBC: 7.4 10*3/uL (ref 4.0–10.5)

## 2014-08-11 LAB — MAGNESIUM: Magnesium: 1.7 mg/dL (ref 1.5–2.5)

## 2014-08-11 LAB — GLUCOSE, CAPILLARY: Glucose-Capillary: 100 mg/dL — ABNORMAL HIGH (ref 70–99)

## 2014-08-11 LAB — PHOSPHORUS: PHOSPHORUS: 3.3 mg/dL (ref 2.3–4.6)

## 2014-08-11 LAB — SEDIMENTATION RATE: Sed Rate: 29 mm/hr — ABNORMAL HIGH (ref 0–16)

## 2014-08-11 LAB — HEPARIN LEVEL (UNFRACTIONATED)
Heparin Unfractionated: 0.29 IU/mL — ABNORMAL LOW (ref 0.30–0.70)
Heparin Unfractionated: 0.34 IU/mL (ref 0.30–0.70)

## 2014-08-11 MED ORDER — PANTOPRAZOLE SODIUM 40 MG IV SOLR
40.0000 mg | Freq: Two times a day (BID) | INTRAVENOUS | Status: DC
  Administered 2014-08-11 – 2014-08-13 (×5): 40 mg via INTRAVENOUS
  Filled 2014-08-11 (×6): qty 40

## 2014-08-11 MED ORDER — MAGNESIUM SULFATE 2 GM/50ML IV SOLN
2.0000 g | Freq: Once | INTRAVENOUS | Status: AC
  Administered 2014-08-11: 2 g via INTRAVENOUS
  Filled 2014-08-11: qty 50

## 2014-08-11 NOTE — Progress Notes (Signed)
Patient is refusing CPAP at this time. Patient stated it is uncomfortable with his NG tube causing him discomfort. RT informed patient if he changed his mind to have his nurse call and we would place him on it.

## 2014-08-11 NOTE — Progress Notes (Signed)
PULMONARY / CRITICAL CARE MEDICINE   Name: Dylan [name] MRN: [id number] DOB: [birth year]    ADMISSION DATE:  08/04/2014 CONSULTATION DATE:  08/11/2014  REFERRING MD :  Tat  CHIEF COMPLAINT:  Abd pain  INITIAL PRESENTATION:  49 y.o. M with 4 days diffuse abd pain, found to have small bowel enteritis along with occlusive thrombus in SMV extending through main portal vein.  Underwent lysis by IR on 08/08/14 and returned to ICU on tPA infusion.  STUDIES:  CT Abd 01/01 >>> several loops of abnormal enlarged thick walled small bowel loops with surrounding inflammation and fluid in RLQ. US Renal 01/02 >>> prior small bowel wall thickening in RLQ is attributed to likely SMV thrombosis.  Small amt of ascites adjacent to the right hepatic lobe. Complex lesion of left mid kidney. MRI 01/02 >>> Occlusive thrombus in SMV extending into main portal vein.  Abdominal ascites, left renal cyst. CT abd 01/04 >>> persistent small bowel thickening. Increased small bowel dilatation suspicious for SBO, increase in diffuse mesenteric edema and ascites.  SIGNIFICANT EVENTS: 01/01 - admit 01/05 - lysis by IR, placed on tPA infusion 01/06 - PCCM consulted to take over as primary team, repeat eval by IR, large thrombus remaining, tPA to continue for another 24 hours 1/07- Abd port xray: Increasing small bowel dilatation when compared with the prior exam.  SUBJECTIVE:  Lying in bed, feeling better, NGT in place, passing gas, large BM this morning, abdomen less distended.  VITAL SIGNS: Temp:  [98.3 F (36.8 C)-98.8 F (37.1 C)] 98.4 F (36.9 C) (01/08 0411) Pulse Rate:  [78-133] 87 (01/08 0600) Resp:  [10-98] 20 (01/08 0744) BP: (110-185)/(62-121) 110/75 mmHg (01/08 0600) SpO2:  [92 %-100 %] 98 % (01/08 0744) HEMODYNAMICS:   VENTILATOR SETTINGS:   INTAKE / OUTPUT: Intake/Output      01/07 0701 - 01/08 0700 01/08 0701 - 01/09 0700   I.V. (mL/kg) 3136.2 (22.5) 1.4 (0)   IV Piggyback 750    Total  Intake(mL/kg) 3886.2 (27.8) 1.4 (0)   Urine (mL/kg/hr) 2050 (0.6)    Total Output 2050     Net +1836.2 +1.4        Stool Occurrence 1 x      PHYSICAL EXAMINATION: General: lying in bed, no acute distress Neuro: A&O x 3 HEENT: EOMI, NGT in place Cardiovascular: RRR Lungs: Respirations even and unlabored.  CTA bilaterally anteriorly, improved respiratory effort Abdomen: distended--improving, non-tender to light palpation, hypoactive bowel sounds.  Dressing intact on right side at site of catheter, oozing Musculoskeletal: 2+ pitting edema, tenderness to palpation  LABS:  CBC  Recent Labs Lab 08/09/14 2300 08/10/14 0500 08/10/14 1100  WBC 7.4 7.7 7.5  HGB 13.4 13.0 13.0  HCT 41.3 40.3 40.6  PLT 267 267 276   Coag's  Recent Labs Lab 08/05/14 1312  APTT 28  INR 1.21   BMET  Recent Labs Lab 08/08/14 1338 08/09/14 0442 08/10/14 0500  NA 134* 137 136  K 4.1 4.2 4.7  CL 104 107 105  CO2 23 24 26   BUN 24* 23 18  CREATININE 1.42* 1.31 1.16  GLUCOSE 106* 91 104*   Electrolytes  Recent Labs Lab 08/08/14 1338 08/09/14 0442 08/10/14 0500  CALCIUM 7.7* 7.5* 7.8*  MG  --   --  1.7  PHOS  --   --  3.0   Sepsis Markers  Recent Labs Lab 08/07/14 0610 08/08/14 1401 08/09/14 0429 08/10/14 0938 08/11/14 0333  LATICACIDVEN 1.0 1.2 1.1  --   --  PROCALCITON  --   --   --  0.45 0.45   Liver Enzymes  Recent Labs Lab 08/08/14 1338 08/09/14 0442 08/10/14 0500  AST 124* 187* 138*  ALT 71* 100* 108*  ALKPHOS 57 58 66  BILITOT 0.6 1.0 1.0  ALBUMIN 2.3* 2.1* 2.2*   Glucose  Recent Labs Lab 08/09/14 0447 08/09/14 0821 08/09/14 1159 08/09/14 1937 08/10/14 0501 08/10/14 0821  GLUCAP 91 76 102* 113* 90 120*   Imaging Dg Chest Port 1 View  08/10/2014   CLINICAL DATA:  49 year old male with increasing shortness of breath  EXAM: PORTABLE CHEST - 1 VIEW  COMPARISON:  Prior chest x-ray 04/17/2013  FINDINGS: Low inspiratory volumes. Nonspecific right mid  and lower lung airspace opacity. Suspect a small layering effusion. No pulmonary edema. Cardiac and mediastinal contours are within normal limits. No pneumothorax. No acute osseous abnormality.  IMPRESSION: Low inspiratory volumes with new right mid and lower lung opacities. Favored to reflect a small right layering pleural effusion with associated atelectasis. Right lower lobe infiltrate, or mild alveolar hemorrhage are also within the differential.   Electronically Signed   By: Jacqulynn Cadet M.D.   On: 08/10/2014 10:02   Dg Abd Portable 1v  08/10/2014   CLINICAL DATA:  Abdominal distension with history of portal vein and superior mesenteric vein thrombus  EXAM: PORTABLE ABDOMEN - 1 VIEW  COMPARISON:  08/07/2014  FINDINGS: Small bowel dilatation is again identified but has increased in the interval from the prior exam. There remains some contrast within the proximal colon stable from the prior study. Postsurgical changes are noted. An infusion catheter is seen within the portal vein and superior mesenteric vein. No free air is seen.  IMPRESSION: Increasing small bowel dilatation when compared with the prior exam.   Electronically Signed   By: Inez Catalina M.D.   On: 08/10/2014 10:01   ASSESSMENT / PLAN:  PULMONARY A: ATX  P:   Wean off [place] o2, currently 2L pcxr Incentive spirometer q2h Upright as able  CARDIOVASCULAR A:  CAD s/p PCI 2013 Hx HTN, HLD P:  Continue beta blocker Hold asa with bleeding See heme Pos balance  RENAL A:   AKI - resolving  Recent Labs Lab 08/06/14 0533 08/07/14 0610 08/08/14 1338 08/09/14 0442 08/10/14 0500  CREATININE 2.00* 1.54* 1.42* 1.31 1.16   P:   NS @ 75 Trend bmet, mag, phos Keep k greater 4, mag greater 2 with ileus NO lasix  GASTROINTESTINAL A:   SBO--NGT in place, large BM this AM, passing gas Small bowel enteritis Transaminitis - likely due to ETOH as well as portal vein thrombosis--trending down Hypoalbuminemia Obese Bowel  congestion from thrombosis Bloody BM this morning P:   CCS following Pepcid Ice chips, advance per CCS recs NGT in place NPO with sips, advance per surgery KUB assessment Reduce narcs as able  HEMATOLOGIC A:   PV and SMV thrombosis - s/p lysis by IR and tPA infusion, stopped 1/7.  Hypercoagulable state 49 yr old--Provoked in setting of testosterone injections ESR 29 ANA pending Homocysteine pending [id number] pending  P:  Heparin Heme following--dr. Alvy Bimler SCD's Trend CBC Heparin important, had 1 bloody BM, no drop in hct, maintain heparin  INFECTIOUS A:   Small bowel enteritis PCT 0.45 No evidence infection, at risk cdiff P:   Day 8 of abx--will stop today Cipro 1/1>>>1/8 Flagyl 1/1>>1/8  ENDOCRINE A:   Prediabetes P:   No interventions required  NEUROLOGIC A:   Pain--improving Hx CVA  s/p cardiac cath 2011 P:   Try to wean off Dilaudid  Transfer to SDU today, TRH to resume care  Family updated: Wife updated 1/7  Interdisciplinary Family Meeting v Palliative Care Meeting:  N/A.  Signed: Jerene Pitch, MD PGY-3, Internal Medicine Resident Pager: [phone]  08/11/2014,7:50 AM   STAFF NOTE: Linwood Dibbles, MD FACP have personally reviewed patient's available data, including medical history, events of note, physical examination and test results as part of my evaluation. I have discussed with resident/NP and other care providers such as pharmacist, RN and RRT. In addition, I personally evaluated patient and elicited key findings of: some progress clinically abdo, moving gas, hct frequent with some blood in stool, follow hypercoagulable panel, IR packed tract, may need to reduce fluids, dc abx    Lavon Paganini. Titus Mould, MD, [place] Pgr: [facility] 08/11/2014 10:45 AM

## 2014-08-11 NOTE — Progress Notes (Signed)
ANTICOAGULATION CONSULT NOTE - Follow Up Consult  Pharmacy Consult for Heparin  Indication: Mesenteric vein thrombosis  Allergies  Allergen Reactions  . Ace Inhibitors Cough  . Lipitor [Atorvastatin] Other (See Comments)    myalgia  . Codeine Itching    Patient Measurements: Height: 6\' 2"  (188 cm) Weight: (!) 307 lb 12.2 oz (139.6 kg) IBW/kg (Calculated) : 82.2   Vital Signs: Temp: 98.3 F (36.8 C) (01/07 2347) Temp Source: Oral (01/07 2347) BP: 127/67 mmHg (01/08 0100) Pulse Rate: 95 (01/08 0100)  Labs:  Recent Labs  08/08/14 1338  08/09/14 0442  08/09/14 2300 08/10/14 0500 08/10/14 1100 08/11/14 0050  HGB 13.9  --  13.4  < > 13.4 13.0 13.0  --   HCT 43.3  --  41.5  < > 41.3 40.3 40.6  --   PLT 257  --  242  < > 267 267 276  --   HEPARINUNFRC  --   < > 0.61  < > 0.64 0.36 0.51 0.29*  CREATININE 1.42*  --  1.31  --   --  1.16  --   --   < > = values in this interval not displayed.  Estimated Creatinine Clearance: 114.6 mL/min (by C-G formula based on Cr of 1.16).   Assessment: Slightly sub-therapeutic heparin level, alteplase is now OFF, no current issues per RN.   Goal of Therapy:  Heparin level 0.3-0.5 units/ml Monitor platelets by anticoagulation protocol: Yes   Plan:  -Increase heparin to 2300 units/hr -0900 HL -Daily CBC/HL -Monitor for bleeding  Narda Bonds 08/11/2014,1:54 AM

## 2014-08-11 NOTE — Progress Notes (Signed)
Patient ID: Dylan [name], male   DOB: April 10, 1966, 49 y.o.   MRN: [id number]    Subjective: Pt feels well today.  Just had a very large liquid stool.  Pain continues to improve.  Catheter removed yesterday  Objective: Vital signs in last 24 hours: Temp:  [98.3 F (36.8 C)-98.8 F (37.1 C)] 98.4 F (36.9 C) (01/08 0411) Pulse Rate:  [78-133] 87 (01/08 0600) Resp:  [10-98] 20 (01/08 0744) BP: (110-185)/(62-121) 110/75 mmHg (01/08 0600) SpO2:  [92 %-100 %] 98 % (01/08 0744) Last BM Date: 08/08/14  Intake/Output from previous day: 01/07 0701 - 01/08 0700 In: 3886.2 [I.V.:3136.2; IV Piggyback:750] Out: 2050 [Urine:2050] Intake/Output this shift: Total I/O In: 1.4 [I.V.:1.4] Out: -   PE: Abd: distended, tight, few BS, NT, NGT with some watered down brown/bilious output Heart: regular, mildly tachy  Lab Results:   Recent Labs  08/10/14 0500 08/10/14 1100  WBC 7.7 7.5  HGB 13.0 13.0  HCT 40.3 40.6  PLT 267 276   BMET  Recent Labs  08/09/14 0442 08/10/14 0500  NA 137 136  K 4.2 4.7  CL 107 105  CO2 24 26  GLUCOSE 91 104*  BUN 23 18  CREATININE 1.31 1.16  CALCIUM 7.5* 7.8*   PT/INR No results for input(s): LABPROT, INR in the last 72 hours. CMP     Component Value Date/Time   NA 136 08/10/2014 0500   K 4.7 08/10/2014 0500   CL 105 08/10/2014 0500   CO2 26 08/10/2014 0500   GLUCOSE 104* 08/10/2014 0500   BUN 18 08/10/2014 0500   CREATININE 1.16 08/10/2014 0500   CALCIUM 7.8* 08/10/2014 0500   PROT 4.7* 08/10/2014 0500   ALBUMIN 2.2* 08/10/2014 0500   AST 138* 08/10/2014 0500   ALT 108* 08/10/2014 0500   ALKPHOS 66 08/10/2014 0500   BILITOT 1.0 08/10/2014 0500   GFRNONAA 72* 08/10/2014 0500   GFRAA 84* 08/10/2014 0500   Lipase     Component Value Date/Time   LIPASE 59 08/04/2014 0412       Studies/Results: Dg Chest Port 1 View  08/10/2014   CLINICAL DATA:  49 year old male with increasing shortness of breath  EXAM: PORTABLE CHEST - 1 VIEW   COMPARISON:  Prior chest x-ray 04/17/2013  FINDINGS: Low inspiratory volumes. Nonspecific right mid and lower lung airspace opacity. Suspect a small layering effusion. No pulmonary edema. Cardiac and mediastinal contours are within normal limits. No pneumothorax. No acute osseous abnormality.  IMPRESSION: Low inspiratory volumes with new right mid and lower lung opacities. Favored to reflect a small right layering pleural effusion with associated atelectasis. Right lower lobe infiltrate, or mild alveolar hemorrhage are also within the differential.   Electronically Signed   By: Jacqulynn Cadet M.D.   On: 08/10/2014 10:02   Dg Abd Portable 1v  08/10/2014   CLINICAL DATA:  Abdominal distension with history of portal vein and superior mesenteric vein thrombus  EXAM: PORTABLE ABDOMEN - 1 VIEW  COMPARISON:  08/07/2014  FINDINGS: Small bowel dilatation is again identified but has increased in the interval from the prior exam. There remains some contrast within the proximal colon stable from the prior study. Postsurgical changes are noted. An infusion catheter is seen within the portal vein and superior mesenteric vein. No free air is seen.  IMPRESSION: Increasing small bowel dilatation when compared with the prior exam.   Electronically Signed   By: Inez Catalina M.D.   On: 08/10/2014 10:01   Ir Jacolyn Reedy  F/u Eval Art/ven Subseq Day (ms)  08/09/2014   CLINICAL DATA:  49 year old with SMV and portal vein thrombus and concern for bowel ischemia. Catheter-directed thrombolytic therapy was started on 08/08/2014. Patient returns for follow-up angiography.  EXAM: THROMBOLYTIC FOLLOW-UP ANGIOGRAPHY  Physician: Stephan Minister. Henn, MD  FLUOROSCOPY TIME:  6 min and 18 seconds, 1090 mGy  MEDICATIONS: 2 mg Versed, 100 mcg fentanyl. A radiology nurse monitored the patient for moderate sedation.  ANESTHESIA/SEDATION: Moderate sedation time: 60 min  PROCEDURE: Patient was placed supine. The existing catheters in the right lateral abdomen  were prepped and draped in sterile fashion. Maximal barrier sterile technique was utilized including caps, mask, sterile gowns, sterile gloves, sterile drape, hand hygiene and skin antiseptic. Contrast was initially injected through the infusion wire port. Subsequently, the infusion wire was removed and contrast was injected through the infusion catheter. These initial angiographic images were limited. As a result, the infusion catheter was removed for a Kumpe catheter over a Bentson wire. Additional angiography was performed. A new 135 cm infusion catheter with 20 cm of infusion length was advanced over a Rosen wire. Tip was placed in the right lower quadrant and similar placed from the previous study. Catheters were secured to the patient. IV heparin and catheter-directed tPA was again started. TPA was started at 0.5 mg/hour.  FINDINGS: There is hepatopetal flow in the SMV but there continues to be a large amount of thrombus throughout the dilated SMV. There is flow within the main portal vein and the proximal left and right portal veins. Suspect nonocclusive clot in the main portal vein.  Multiple dilated loops of small bowel throughout the abdomen  COMPLICATIONS: None  IMPRESSION: There is flow in the superior mesenteric vein but there continues to be a large amount of irregular thrombus. As a result, a new infusion catheter was placed. Will continue catheter-directed thrombolytic therapy overnight with IV heparin. Plan to have the patient return to Interventional Radiology on 08/10/2014 for follow-up angiography and possibly mechanical thrombectomy.  Patient continues to have multiple dilated loops of small bowel throughout the abdomen. The patient's symptoms are improving with catheter-directed thrombolytic therapy but the radiographic appearance of the abdomen is still concerning for a bowel obstruction.   Electronically Signed   By: Markus Daft M.D.   On: 08/09/2014 18:16     Anti-infectives: Anti-infectives    Start     Dose/Rate Route Frequency Ordered Stop   08/08/14 1418  ceFAZolin (ANCEF) IVPB 2 g/50 mL premix     2 g100 mL/hr over 30 Minutes Intravenous 60 min pre-op 08/08/14 1413 08/08/14 1530   08/04/14 0845  ciprofloxacin (CIPRO) IVPB 400 mg     400 mg200 mL/hr over 60 Minutes Intravenous Every 12 hours 08/04/14 0834     08/04/14 0845  metroNIDAZOLE (FLAGYL) IVPB 500 mg     500 mg100 mL/hr over 60 Minutes Intravenous Every 8 hours 08/04/14 0834         Assessment/Plan 1. SMV thrombus, s/p transhepatic thrombolysis by IR 2. ileus 3. AKI, resolved  Plan: 1. Patient, despite diarrhea, still has a significant ileus.  His abdomen is distended and tight.  Cont NGT management. 2. Heparin and further care per primary service.  LOS: 7 days    Sevyn Paredez E 08/11/2014, 8:20 AM Pager: [phone]

## 2014-08-11 NOTE — Progress Notes (Signed)
ANTICOAGULATION CONSULT NOTE - Follow Up Consult  Pharmacy Consult for Heparin  Indication: Mesenteric vein thrombosis  Allergies  Allergen Reactions  . Ace Inhibitors Cough  . Lipitor [Atorvastatin] Other (See Comments)    myalgia  . Codeine Itching    Patient Measurements: Height: 6\' 2"  (188 cm) Weight: (!) 307 lb 12.2 oz (139.6 kg) IBW/kg (Calculated) : 82.2   Vital Signs: Temp: 98.4 F (36.9 C) (01/08 0411) Temp Source: Axillary (01/08 0411) BP: 110/75 mmHg (01/08 0600) Pulse Rate: 87 (01/08 0600)  Labs:  Recent Labs  08/08/14 1338  08/09/14 0442  08/10/14 0500 08/10/14 1100 08/11/14 0050 08/11/14 0840 08/11/14 0845  HGB 13.9  --  13.4  < > 13.0 13.0  --   --  12.9*  HCT 43.3  --  41.5  < > 40.3 40.6  --   --  40.5  PLT 257  --  242  < > 267 276  --   --  244  HEPARINUNFRC  --   < > 0.61  < > 0.36 0.51 0.29* 0.34  --   CREATININE 1.42*  --  1.31  --  1.16  --   --   --   --   < > = values in this interval not displayed.  Estimated Creatinine Clearance: 114.6 mL/min (by C-G formula based on Cr of 1.16).   Assessment: Pt on heparin for SMV thrombus- s/p cath directed lysis with alteplase 1/5-1/7. Heparin level now at low end of therapeutic. Pt with bloody BM which is to be expected per Dr. Pascal Lux due to concern for ischemia on recent abd CT. CBC remains stable.  Goal of Therapy:  Heparin level 0.3-0.7 units/ml Monitor platelets by anticoagulation protocol: Yes   Plan:  -Continue heparin at 2300 units/hr -F/u daily heparin level and CBC -Monitor for further bleeding  Sherlon Handing, PharmD, BCPS Clinical pharmacist, pager [phone] 08/11/2014,9:56 AM

## 2014-08-11 NOTE — Progress Notes (Signed)
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 Authorizing Provider  allopurinol (ZYLOPRIM) 300 MG tablet Take 1 tablet by mouth daily. 07/03/14  Yes Historical Provider, MD  aspirin 81 MG chewable tablet Chew 1 tablet (81 mg total) by mouth daily. 07/07/12  Yes Luke K Kilroy, PA-C  azithromycin (ZITHROMAX) 500 MG tablet See admin instructions. 3 tabs on day 1, 2 tabs daily on days 2 and 3 07/25/14  Yes Historical Provider, MD  cefdinir (OMNICEF) 300 MG capsule Take 1 capsule by mouth 2 (two) times daily. 07/29/14  Yes Historical Provider, MD  furosemide (LASIX) 20 MG tablet Take 20 mg by mouth daily.   Yes Historical Provider, MD  Liraglutide -Weight Management (SAXENDA) 18 MG/3ML SOPN Inject 3 mLs into the skin daily.   Yes Historical Provider, MD  metoprolol succinate (TOPROL-XL) 50 MG 24 hr tablet Take 50 mg by mouth daily. Take with or immediately following a meal.   Yes Historical Provider, MD  olmesartan-hydrochlorothiazide (BENICAR HCT) 40-25 MG per tablet Take 1 tablet by mouth daily.   Yes Historical Provider, MD  omega-3 acid ethyl esters (LOVAZA) 1 G capsule Take 1 capsule (1 g total) by mouth daily. <please make appointment>   Yes Brittainy M Simmons, PA-C  predniSONE (STERAPRED UNI-PAK) 5 MG TABS tablet See admin instructions. 6 tabs on the first day, decreasing by 1 tab daily until gone 07/29/14  Yes Historical Provider, MD  testosterone cypionate (DEPOTESTOTERONE CYPIONATE) 200 MG/ML injection Inject 1 mL into the muscle once a week. Wednesday 06/07/14  Yes Historical Provider, MD  acetaminophen (TYLENOL) 325 MG tablet Take 2 tablets (650 mg total) by mouth every 4 (four) hours  as needed. 04/19/13   Erlene Quan, PA-C  fenofibrate 160 MG tablet Take 1 tablet (160 mg total) by mouth daily. Patient not taking: Reported on 08/04/2014 04/18/13   Brittainy M Rosita Fire, PA-C  metoprolol succinate (TOPROL-XL) 25 MG 24 hr tablet Take 1 tablet (25 mg total) by mouth daily. <please make appointment> Patient not taking: Reported on 08/04/2014    Erlene Quan, PA-C  nitroGLYCERIN (NITROSTAT) 0.4 MG SL tablet Place 1 tablet (0.4 mg total) under the tongue every 5 (five) minutes x 3 doses as needed for chest pain. 07/07/12   Erlene Quan, PA-C    Review of Systems  Vital Signs: BP 110/75 mmHg  Pulse 87  Temp(Src) 98.4 F (36.9 C) (Axillary)  Resp 20  Ht 6\' 2"  (1.88 m)  Wt 139.6 kg (307 lb 12.2 oz)  BMI 39.50 kg/m2  SpO2 98%  Physical Exam  Abdominal:  More active BS Less distended Slightly softer abd NT  Skin:  Site at Rt abd - access for lysis is NT; still minimal ooze noted Clean     Imaging: Ct Abdomen Pelvis Wo Contrast  08/07/2014   CLINICAL DATA:  Generalized abdominal pain and cramping. Nausea and vomiting. Superior mesenteric vein thrombosis.  EXAM: CT ABDOMEN AND PELVIS WITHOUT CONTRAST  TECHNIQUE: Multidetector CT imaging of the abdomen and pelvis was performed following the standard protocol without IV contrast.  COMPARISON:  CT on 08/04/2014 and MRI on 08/05/2014  FINDINGS: Lower chest:  New small right pleural effusion.  Hepatobiliary:  No  mass visualized on this non-contrast exam.  Pancreas: No mass or inflammatory process visualized on this non-contrast exam.  Spleen:  Within normal limits in size.  Adrenal Glands:  No mass identified.  Kidneys/Urinary tract: No evidence of urolithiasis or hydronephrosis. 3 cm complex cyst with mild mural calcifications in the posterior upper pole the left kidney is stable.  Stomach/Bowel/Peritoneum: Mild increase and mesenteric edema and mild ascites is seen since previous study. Persistent wall thickening is seen involving  multiple small bowel loops in the right abdomen. In the setting of superior mesenteric vein thrombosis, this is suspicious for bowel ischemia. Increased dilatation of proximal small bowel is also seen. No evidence of pneumatosis or free air.  Vascular/Lymphatic: No pathologically enlarged lymph nodes identified. Superior mesenteric vein remains distended, consistent with venous thrombosis as demonstrated on recent MRI.  Reproductive:  No mass or other significant abnormality noted.  Other:  None.  Musculoskeletal:  No suspicious bone lesions identified.  IMPRESSION: Persistent small bowel wall thickening in the right abdomen, suspicious for bowel ischemia in the setting of SMV thrombosis which was demonstrated on recent MRI.  Increased proximal small bowel dilatation, suspicious for small bowel obstruction.  Increase in diffuse mesenteric edema and ascites.  Critical Value/emergent results were called by telephone at the time of interpretation on 08/07/2014 at 2:37 pm to Dr. Louellen Molder , who verbally acknowledged these results.   Electronically Signed   By: Earle Gell M.D.   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  EXAM: IR ULTRASOUND GUIDANCE VASC ACCESS RIGHT; THROMBOECTOMY MECHANICAL VENOUS; IR INFUSION THROMBOL VENOUS INITIAL (MS); PORTAL VENOGRAPHY; ADDITIONAL ARTERIOGRAPHY  FLUOROSCOPY TIME:  8 min and 6 seconds.  MEDICATIONS AND MEDICAL HISTORY: Versed 4 mg, Fentanyl 175 mcg.  As antibiotic prophylaxis, Ancef was ordered pre-procedure and administered intravenously within one hour of incision.  ANESTHESIA/SEDATION: Moderate sedation time: 52 minutes  CONTRAST:  50 cc Omnipaque 300.  Carbon dioxide.  PROCEDURE: The procedure, risks, benefits, and alternatives were explained to the patient. Questions regarding the procedure were encouraged and answered. The patient understands and  consents to the procedure.  The right flank was prepped with Betadine in a sterile fashion, and a sterile drape was applied covering the operative field. A sterile gown and sterile gloves were used for the procedure.  1% lidocaine was utilized for local infiltration. Under sonographic guidance, a 22 gauge Chiba needle was inserted into the peripheral aspect of the main right portal vein. Contrast was injected confirming needle position. The needle was removed over a 018 wire. The Accustick dilator was inserted. A small amount of contrast was injected. Carbon dioxide portal venography was performed.  The Accustick dilator was exchanged over the 3 J for a 5 [place] Kumpe the catheter. This was advanced into the lower portal vein and venography was performed. The catheter was advanced into the upper SMV and contrast was injected. There is partially occlusive thrombus in the main portal vein and occlusive thrombus in the SMV.  The catheter was carefully advanced over a glidewire to the small venous branches of the distal ileum in the right lower quadrant. Contrast was injected demonstrating stasis of flow within the small branches adjacent to the intestines and complete occlusion within the larger more central branches.  The Kumpe the catheter was removed over a Rosen wire. A 6 French sheath was advanced over the wire  to the upper main portal vein. The Angiojet was utilized to mechanically lysed the thrombus for 96 seconds.  Subsequently, a 90 cm 20 cm infusion length 5 French multi side-hole catheter was advanced over the [place] wire to the right lower quadrant. The catheter is positioned along side the thrombus extending from the main portal vein to the ileum. Lysis with 0.5 mg tPA was then instituted. Heparin protocol was resumed per peripheral access.  FINDINGS: Portal venography confirms partial occlusion of the main portal vein and complete occlusion of the SMV. Splenic vein is patent.  Subsequent venogram images  confirmed occlusion of mesenteric brain branches to the right lower quadrant. Peripheral branches adjacent to the intestine are patent with stasis of flow.  The neck series of images demonstrate placement of a 6 French sheath and multi side hole catheter along side the thrombus, extending from the main portal vein to the small mesenteric venous branches in the right lower quadrant.  COMPLICATIONS: None  IMPRESSION: Successful mechanical and pharmacological lysis of thrombus in the main portal vein, superior mesenteric vein, and venous branches in the right lower quadrant. TPA had 0.5 milligrams/hour will be instituted via multi side-hole catheter along side the thrombus.   Electronically Signed   By: Maryclare Bean M.D.   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  EXAM: IR ULTRASOUND GUIDANCE VASC ACCESS RIGHT; THROMBOECTOMY MECHANICAL VENOUS; IR INFUSION THROMBOL VENOUS INITIAL (MS); PORTAL VENOGRAPHY; ADDITIONAL ARTERIOGRAPHY  FLUOROSCOPY TIME:  8 min and 6 seconds.  MEDICATIONS AND MEDICAL HISTORY: Versed 4 mg, Fentanyl 175 mcg.  As antibiotic prophylaxis, Ancef was ordered pre-procedure and administered intravenously within one hour of incision.  ANESTHESIA/SEDATION: Moderate sedation time: 52 minutes  CONTRAST:  50 cc Omnipaque 300.  Carbon dioxide.  PROCEDURE: The procedure, risks, benefits, and alternatives were explained to the patient. Questions regarding the procedure were encouraged and answered. The patient understands and consents to the procedure.  The right flank was prepped with Betadine in a sterile fashion, and a sterile drape was applied covering the operative field. A sterile gown and sterile gloves were used for the procedure.  1% lidocaine was utilized for local infiltration. Under sonographic guidance, a 22 gauge Chiba needle was inserted into the peripheral aspect of the main right  portal vein. Contrast was injected confirming needle position. The needle was removed over a 018 wire. The Accustick dilator was inserted. A small amount of contrast was injected. Carbon dioxide portal venography was performed.  The Accustick dilator was exchanged over the 3 J for a 5 [place] Kumpe the catheter. This was advanced into the lower portal vein and venography was performed. The catheter was advanced into the upper SMV and contrast was injected. There is partially occlusive thrombus in the main portal vein and occlusive thrombus in the SMV.  The catheter was carefully advanced over a glidewire to the small venous branches of the distal ileum in the right lower quadrant. Contrast was injected demonstrating stasis of flow within the small branches adjacent to the intestines and complete occlusion within the larger more central branches.  The Kumpe the catheter was removed over a Rosen wire. A 6 French sheath was advanced over the wire to the upper main portal vein. The Angiojet was utilized to mechanically lysed the thrombus for 96 seconds.  Subsequently, a 90 cm 20 cm infusion length 5 French multi side-hole catheter  was advanced over the [place] wire to the right lower quadrant. The catheter is positioned along side the thrombus extending from the main portal vein to the ileum. Lysis with 0.5 mg tPA was then instituted. Heparin protocol was resumed per peripheral access.  FINDINGS: Portal venography confirms partial occlusion of the main portal vein and complete occlusion of the SMV. Splenic vein is patent.  Subsequent venogram images confirmed occlusion of mesenteric brain branches to the right lower quadrant. Peripheral branches adjacent to the intestine are patent with stasis of flow.  The neck series of images demonstrate placement of a 6 French sheath and multi side hole catheter along side the thrombus, extending from the main portal vein to the small mesenteric venous branches in the right lower  quadrant.  COMPLICATIONS: None  IMPRESSION: Successful mechanical and pharmacological lysis of thrombus in the main portal vein, superior mesenteric vein, and venous branches in the right lower quadrant. TPA had 0.5 milligrams/hour will be instituted via multi side-hole catheter along side the thrombus.   Electronically Signed   By: Maryclare Bean M.D.   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  EXAM: IR ULTRASOUND GUIDANCE VASC ACCESS RIGHT; THROMBOECTOMY MECHANICAL VENOUS; IR INFUSION THROMBOL VENOUS INITIAL (MS); PORTAL VENOGRAPHY; ADDITIONAL ARTERIOGRAPHY  FLUOROSCOPY TIME:  8 min and 6 seconds.  MEDICATIONS AND MEDICAL HISTORY: Versed 4 mg, Fentanyl 175 mcg.  As antibiotic prophylaxis, Ancef was ordered pre-procedure and administered intravenously within one hour of incision.  ANESTHESIA/SEDATION: Moderate sedation time: 52 minutes  CONTRAST:  50 cc Omnipaque 300.  Carbon dioxide.  PROCEDURE: The procedure, risks, benefits, and alternatives were explained to the patient. Questions regarding the procedure were encouraged and answered. The patient understands and consents to the procedure.  The right flank was prepped with Betadine in a sterile fashion, and a sterile drape was applied covering the operative field. A sterile gown and sterile gloves were used for the procedure.  1% lidocaine was utilized for local infiltration. Under sonographic guidance, a 22 gauge Chiba needle was inserted into the peripheral aspect of the main right portal vein. Contrast was injected confirming needle position. The needle was removed over a 018 wire. The Accustick dilator was inserted. A small amount of contrast was injected. Carbon dioxide portal venography was performed.  The Accustick dilator was exchanged over the 3 J for a 5 [place] Kumpe the catheter. This was advanced into the lower portal vein and venography was  performed. The catheter was advanced into the upper SMV and contrast was injected. There is partially occlusive thrombus in the main portal vein and occlusive thrombus in the SMV.  The catheter was carefully advanced over a glidewire to the small venous branches of the distal ileum in the right lower quadrant. Contrast was injected demonstrating stasis of flow within the small branches adjacent to the intestines and complete occlusion within the larger more central branches.  The Kumpe the catheter was removed over a Rosen wire. A 6 French sheath was advanced over the wire to the upper main portal vein. The Angiojet was utilized to mechanically lysed the thrombus for 96 seconds.  Subsequently, a 90 cm 20 cm infusion length 5 French multi side-hole catheter was advanced over the [place] wire to the right lower quadrant. The catheter is positioned along side the thrombus extending from the main portal vein to the ileum. Lysis with 0.5  mg tPA was then instituted. Heparin protocol was resumed per peripheral access.  FINDINGS: Portal venography confirms partial occlusion of the main portal vein and complete occlusion of the SMV. Splenic vein is patent.  Subsequent venogram images confirmed occlusion of mesenteric brain branches to the right lower quadrant. Peripheral branches adjacent to the intestine are patent with stasis of flow.  The neck series of images demonstrate placement of a 6 French sheath and multi side hole catheter along side the thrombus, extending from the main portal vein to the small mesenteric venous branches in the right lower quadrant.  COMPLICATIONS: None  IMPRESSION: Successful mechanical and pharmacological lysis of thrombus in the main portal vein, superior mesenteric vein, and venous branches in the right lower quadrant. TPA had 0.5 milligrams/hour will be instituted via multi side-hole catheter along side the thrombus.   Electronically Signed   By: Maryclare Bean M.D.   On: 08/08/2014 17:21   Ir US  Guide Vasc Access Right  08/08/2014   CLINICAL DATA:  Superior mesenteric vein and portal vein DVT with bowel ischemia.  EXAM: IR ULTRASOUND GUIDANCE VASC ACCESS RIGHT; THROMBOECTOMY MECHANICAL VENOUS; IR INFUSION THROMBOL VENOUS INITIAL (MS); PORTAL VENOGRAPHY; ADDITIONAL ARTERIOGRAPHY  FLUOROSCOPY TIME:  8 min and 6 seconds.  MEDICATIONS AND MEDICAL HISTORY: Versed 4 mg, Fentanyl 175 mcg.  As antibiotic prophylaxis, Ancef was ordered pre-procedure and administered intravenously within one hour of incision.  ANESTHESIA/SEDATION: Moderate sedation time: 52 minutes  CONTRAST:  50 cc Omnipaque 300.  Carbon dioxide.  PROCEDURE: The procedure, risks, benefits, and alternatives were explained to the patient. Questions regarding the procedure were encouraged and answered. The patient understands and consents to the procedure.  The right flank was prepped with Betadine in a sterile fashion, and a sterile drape was applied covering the operative field. A sterile gown and sterile gloves were used for the procedure.  1% lidocaine was utilized for local infiltration. Under sonographic guidance, a 22 gauge Chiba needle was inserted into the peripheral aspect of the main right portal vein. Contrast was injected confirming needle position. The needle was removed over a 018 wire. The Accustick dilator was inserted. A small amount of contrast was injected. Carbon dioxide portal venography was performed.  The Accustick dilator was exchanged over the 3 J for a 5 [place] Kumpe the catheter. This was advanced into the lower portal vein and venography was performed. The catheter was advanced into the upper SMV and contrast was injected. There is partially occlusive thrombus in the main portal vein and occlusive thrombus in the SMV.  The catheter was carefully advanced over a glidewire to the small venous branches of the distal ileum in the right lower quadrant. Contrast was injected demonstrating stasis of flow within the small branches  adjacent to the intestines and complete occlusion within the larger more central branches.  The Kumpe the catheter was removed over a Rosen wire. A 6 French sheath was advanced over the wire to the upper main portal vein. The Angiojet was utilized to mechanically lysed the thrombus for 96 seconds.  Subsequently, a 90 cm 20 cm infusion length 5 French multi side-hole catheter was advanced over the [place] wire to the right lower quadrant. The catheter is positioned along side the thrombus extending from the main portal vein to the ileum. Lysis with 0.5 mg tPA was then instituted. Heparin protocol was resumed per peripheral access.  FINDINGS: Portal venography confirms partial occlusion of the main portal vein and complete occlusion of the SMV. Splenic  vein is patent.  Subsequent venogram images confirmed occlusion of mesenteric brain branches to the right lower quadrant. Peripheral branches adjacent to the intestine are patent with stasis of flow.  The neck series of images demonstrate placement of a 6 French sheath and multi side hole catheter along side the thrombus, extending from the main portal vein to the small mesenteric venous branches in the right lower quadrant.  COMPLICATIONS: None  IMPRESSION: Successful mechanical and pharmacological lysis of thrombus in the main portal vein, superior mesenteric vein, and venous branches in the right lower quadrant. TPA had 0.5 milligrams/hour will be instituted via multi side-hole catheter along side the thrombus.   Electronically Signed   By: Maryclare Bean M.D.   On: 08/08/2014 17:21   Dg Chest Port 1 View  08/11/2014   CLINICAL DATA:  Hypoxia.  EXAM: PORTABLE CHEST - 1 VIEW  COMPARISON:  August 10, 2014.  FINDINGS: Stable cardiomediastinal silhouette. No pneumothorax or significant pleural effusion is noted. Nasogastric tube tip is seen in expected position of proximal stomach. Left lung is clear. Right lower lobe opacity noted on prior exam is slightly improved, although  significant residual density remains concerning for atelectasis or pneumonia. Bony thorax appears intact.  IMPRESSION: Nasogastric tube tip seen in proximal stomach.  Improved right basilar opacity is noted, although significant residual density remains consistent with pneumonia or subsegmental atelectasis. Continued radiographic follow-up is recommended until resolution.   Electronically Signed   By: Sabino Dick M.D.   On: 08/11/2014 08:30   Dg Chest Port 1 View  08/10/2014   CLINICAL DATA:  49 year old male with increasing shortness of breath  EXAM: PORTABLE CHEST - 1 VIEW  COMPARISON:  Prior chest x-ray 04/17/2013  FINDINGS: Low inspiratory volumes. Nonspecific right mid and lower lung airspace opacity. Suspect a small layering effusion. No pulmonary edema. Cardiac and mediastinal contours are within normal limits. No pneumothorax. No acute osseous abnormality.  IMPRESSION: Low inspiratory volumes with new right mid and lower lung opacities. Favored to reflect a small right layering pleural effusion with associated atelectasis. Right lower lobe infiltrate, or mild alveolar hemorrhage are also within the differential.   Electronically Signed   By: Jacqulynn Cadet M.D.   On: 08/10/2014 10:02   Dg Abd Portable 1v  08/11/2014   CLINICAL DATA:  Small bowel obstruction.  EXAM: PORTABLE ABDOMEN - 1 VIEW  COMPARISON:  August 10, 2014.  FINDINGS: There remains multiple dilated loops of small bowel concerning for distal small bowel obstruction. These are stable compared to prior exam. No significant colonic dilatation is noted. No renal calculi are noted.  IMPRESSION: Stable dilated small bowel loops are noted consistent with distal small bowel obstruction. Continued radiographic follow-up is recommended.   Electronically Signed   By: Sabino Dick M.D.   On: 08/11/2014 08:32   Dg Abd Portable 1v  08/10/2014   CLINICAL DATA:  Abdominal distension with history of portal vein and superior mesenteric vein thrombus   EXAM: PORTABLE ABDOMEN - 1 VIEW  COMPARISON:  08/07/2014  FINDINGS: Small bowel dilatation is again identified but has increased in the interval from the prior exam. There remains some contrast within the proximal colon stable from the prior study. Postsurgical changes are noted. An infusion catheter is seen within the portal vein and superior mesenteric vein. No free air is seen.  IMPRESSION: Increasing small bowel dilatation when compared with the prior exam.   Electronically Signed   By: Inez Catalina M.D.   On: 08/10/2014  10:01   Ir Infusion Thrombol Venous Initial (ms)  08/08/2014   CLINICAL DATA:  Superior mesenteric vein and portal vein DVT with bowel ischemia.  EXAM: IR ULTRASOUND GUIDANCE VASC ACCESS RIGHT; THROMBOECTOMY MECHANICAL VENOUS; IR INFUSION THROMBOL VENOUS INITIAL (MS); PORTAL VENOGRAPHY; ADDITIONAL ARTERIOGRAPHY  FLUOROSCOPY TIME:  8 min and 6 seconds.  MEDICATIONS AND MEDICAL HISTORY: Versed 4 mg, Fentanyl 175 mcg.  As antibiotic prophylaxis, Ancef was ordered pre-procedure and administered intravenously within one hour of incision.  ANESTHESIA/SEDATION: Moderate sedation time: 52 minutes  CONTRAST:  50 cc Omnipaque 300.  Carbon dioxide.  PROCEDURE: The procedure, risks, benefits, and alternatives were explained to the patient. Questions regarding the procedure were encouraged and answered. The patient understands and consents to the procedure.  The right flank was prepped with Betadine in a sterile fashion, and a sterile drape was applied covering the operative field. A sterile gown and sterile gloves were used for the procedure.  1% lidocaine was utilized for local infiltration. Under sonographic guidance, a 22 gauge Chiba needle was inserted into the peripheral aspect of the main right portal vein. Contrast was injected confirming needle position. The needle was removed over a 018 wire. The Accustick dilator was inserted. A small amount of contrast was injected. Carbon dioxide portal  venography was performed.  The Accustick dilator was exchanged over the 3 J for a 5 [place] Kumpe the catheter. This was advanced into the lower portal vein and venography was performed. The catheter was advanced into the upper SMV and contrast was injected. There is partially occlusive thrombus in the main portal vein and occlusive thrombus in the SMV.  The catheter was carefully advanced over a glidewire to the small venous branches of the distal ileum in the right lower quadrant. Contrast was injected demonstrating stasis of flow within the small branches adjacent to the intestines and complete occlusion within the larger more central branches.  The Kumpe the catheter was removed over a Rosen wire. A 6 French sheath was advanced over the wire to the upper main portal vein. The Angiojet was utilized to mechanically lysed the thrombus for 96 seconds.  Subsequently, a 90 cm 20 cm infusion length 5 French multi side-hole catheter was advanced over the [place] wire to the right lower quadrant. The catheter is positioned along side the thrombus extending from the main portal vein to the ileum. Lysis with 0.5 mg tPA was then instituted. Heparin protocol was resumed per peripheral access.  FINDINGS: Portal venography confirms partial occlusion of the main portal vein and complete occlusion of the SMV. Splenic vein is patent.  Subsequent venogram images confirmed occlusion of mesenteric brain branches to the right lower quadrant. Peripheral branches adjacent to the intestine are patent with stasis of flow.  The neck series of images demonstrate placement of a 6 French sheath and multi side hole catheter along side the thrombus, extending from the main portal vein to the small mesenteric venous branches in the right lower quadrant.  COMPLICATIONS: None  IMPRESSION: Successful mechanical and pharmacological lysis of thrombus in the main portal vein, superior mesenteric vein, and venous branches in the right lower quadrant. TPA  had 0.5 milligrams/hour will be instituted via multi side-hole catheter along side the thrombus.   Electronically Signed   By: Maryclare Bean M.D.   On: 08/08/2014 17:21   Ir Jacolyn Reedy F/u Eval Art/ven Alwyn Ren Day (ms)  08/09/2014   CLINICAL DATA:  49 year old with SMV and portal vein thrombus and concern for bowel ischemia. Catheter-directed  thrombolytic therapy was started on 08/08/2014. Patient returns for follow-up angiography.  EXAM: THROMBOLYTIC FOLLOW-UP ANGIOGRAPHY  Physician: Stephan Minister. Henn, MD  FLUOROSCOPY TIME:  6 min and 18 seconds, 1090 mGy  MEDICATIONS: 2 mg Versed, 100 mcg fentanyl. A radiology nurse monitored the patient for moderate sedation.  ANESTHESIA/SEDATION: Moderate sedation time: 60 min  PROCEDURE: Patient was placed supine. The existing catheters in the right lateral abdomen were prepped and draped in sterile fashion. Maximal barrier sterile technique was utilized including caps, mask, sterile gowns, sterile gloves, sterile drape, hand hygiene and skin antiseptic. Contrast was initially injected through the infusion wire port. Subsequently, the infusion wire was removed and contrast was injected through the infusion catheter. These initial angiographic images were limited. As a result, the infusion catheter was removed for a Kumpe catheter over a [place] wire. Additional angiography was performed. A new 135 cm infusion catheter with 20 cm of infusion length was advanced over a Rosen wire. Tip was placed in the right lower quadrant and similar placed from the previous study. Catheters were secured to the patient. IV heparin and catheter-directed tPA was again started. TPA was started at 0.5 mg/hour.  FINDINGS: There is hepatopetal flow in the SMV but there continues to be a large amount of thrombus throughout the dilated SMV. There is flow within the main portal vein and the proximal left and right portal veins. Suspect nonocclusive clot in the main portal vein.  Multiple dilated loops of small bowel  throughout the abdomen  COMPLICATIONS: None  IMPRESSION: There is flow in the superior mesenteric vein but there continues to be a large amount of irregular thrombus. As a result, a new infusion catheter was placed. Will continue catheter-directed thrombolytic therapy overnight with IV heparin. Plan to have the patient return to Interventional Radiology on 08/10/2014 for follow-up angiography and possibly mechanical thrombectomy.  Patient continues to have multiple dilated loops of small bowel throughout the abdomen. The patient's symptoms are improving with catheter-directed thrombolytic therapy but the radiographic appearance of the abdomen is still concerning for a bowel obstruction.   Electronically Signed   By: Markus Daft M.D.   On: 08/09/2014 18:16    Labs:  CBC:  Recent Labs  08/09/14 1720 08/09/14 2300 08/10/14 0500 08/10/14 1100  WBC 7.5 7.4 7.7 7.5  HGB 13.5 13.4 13.0 13.0  HCT 42.2 41.3 40.3 40.6  PLT 246 267 267 276    COAGS:  Recent Labs  08/05/14 1312  INR 1.21  APTT 28    BMP:  Recent Labs  08/07/14 0610 08/08/14 1338 08/09/14 0442 08/10/14 0500  NA 132* 134* 137 136  K 4.4 4.1 4.2 4.7  CL 99 104 107 105  CO2 30 23 24 26   GLUCOSE 122* 106* 91 104*  BUN 35* 24* 23 18  CALCIUM 7.2* 7.7* 7.5* 7.8*  CREATININE 1.54* 1.42* 1.31 1.16  GFRNONAA 51* 57* 62* 72*  GFRAA 60* 66* 72* 84*    LIVER FUNCTION TESTS:  Recent Labs  08/04/14 0412 08/08/14 1338 08/09/14 0442 08/10/14 0500  BILITOT 0.9 0.6 1.0 1.0  AST 34 124* 187* 138*  ALT 39 71* 100* 108*  ALKPHOS 69 57 58 66  PROT 7.0 5.2* 4.5* 4.7*  ALBUMIN 3.6 2.3* 2.1* 2.2*    Assessment and Plan: Portal v and superior mesenteric v thrombolysis 1/5 to 1/7 in IR End result good Pt feels better today---still with significant ileus BM bloody  Discussed with Dr Pascal Lux Bloody BM somewhat expected after procedure  and presumed bowel ischemia on CT prior to procedure If persists or worsens would have low  threshold for repeat CT to evaluate bowel. Will follow few days   I spent a total of 20 minutes face to face in clinical consultation/evaluation, greater than 50% of which was counseling/coordinating care for PV and SMV lysis  Signed: Stevee Valenta A 08/11/2014, 8:55 AM

## 2014-08-11 NOTE — Progress Notes (Signed)
Report given to [facility] and pt transferred to 3s10.  Pt has no s/s of any acute distress at time of dc.

## 2014-08-11 NOTE — Progress Notes (Signed)
Pt had large, loose, black with clots and curdled stool this AM. Able to use BSC with no issues or dizziness. Dr. Titus Mould made aware.

## 2014-08-11 NOTE — Progress Notes (Signed)
Dylan [name]   DOB:06/15/1966   UT#:[id number]   SFK#:[id number]  Patient Care Team: Raelyn Number, MD as PCP - General (Internal Medicine)  Subjective: I have seen the patient, examined him and edited the notes as follows  Patient seen and examined. Abdominal pain has improved, with less distention. Denies nausea, vomiting, had one large watery stool episode this morning.  Lysis catheter discontinued on 1/7 per interventional radiology. Denies respiratory or cardiac complaints. His lower extremity swelling is improving today. Denies any bleeding issues such as epistaxis, hematemesis, hematuria or hematochezia. Has NG tube placement.  Scheduled Meds: . allopurinol  300 mg Oral Daily  . antiseptic oral rinse  7 mL Mouth Rinse BID  . ciprofloxacin  400 mg Intravenous Q12H  . famotidine (PEPCID) IV  20 mg Intravenous Q12H  . HYDROmorphone PCA 0.3 mg/mL   Intravenous 6 times per day  . metoprolol succinate  50 mg Oral Daily  . metronidazole  500 mg Intravenous Q8H  . omega-3 acid ethyl esters  1 g Oral Daily  . sodium chloride  3 mL Intravenous Q12H   Continuous Infusions: . sodium chloride 75 mL/hr at 08/11/14 0439  . heparin 2,300 Units/hr (08/11/14 0212)   PRN Meds:sodium chloride, acetaminophen **OR** acetaminophen, diphenhydrAMINE **OR** diphenhydrAMINE, naloxone **AND** sodium chloride, nitroGLYCERIN, ondansetron **OR** ondansetron (ZOFRAN) IV, ondansetron (ZOFRAN) IV, promethazine, sodium chloride, zolpidem   Objective:  Filed Vitals:   08/11/14 0744  BP:   Pulse:   Temp:   Resp: 20      Intake/Output Summary (Last 24 hours) at 08/11/14 1002 Last data filed at 08/11/14 0742  Gross per 24 hour  Intake 3288.51 ml  Output   2050 ml  Net 1238.51 ml    GENERAL:alert, no distress and uncomfortable. He is morbidly obese SKIN: skin color, texture, turgor are normal, no rashes or significant lesions EYES: normal, conjunctiva are pink and non-injected, sclera  clear OROPHARYNX: no exudate, no erythema and lips, buccal mucosa, and tongue normal . NG tube draining well, no blood visible NECK: supple, thyroid normal size, non-tender, without nodularity LYMPH: no palpable lymphadenopathy in the cervical, axillary or inguinal LUNGS: decreased sounds at the right base, otherwise clear with normal breathing effort HEART: regular rate & rhythm and no murmurs and  2+ bilateral lower extremity edema ABDOMEN: abdomen obese,  Less distended from prior exam, non tender, and decreased bowel sounds.  Musculoskeletal: no cyanosis of digits and no clubbing  PSYCH: alert & oriented x 3 with fluent speech NEURO: no focal motor/sensory deficits    CBG (last 3)   Recent Labs  08/10/14 0501 08/10/14 0821 08/11/14 0829  GLUCAP 90 120* 100*     Labs:   Recent Labs Lab 08/09/14 1720 08/09/14 2300 08/10/14 0500 08/10/14 1100 08/11/14 0845  WBC 7.5 7.4 7.7 7.5 6.7  HGB 13.5 13.4 13.0 13.0 12.9*  HCT 42.2 41.3 40.3 40.6 40.5  PLT 246 267 267 276 244  MCV 91.7 91.0 90.8 90.8 90.2  MCH 29.3 29.5 29.3 29.1 28.7  MCHC 32.0 32.4 32.3 32.0 31.9  RDW 15.0 14.7 14.8 14.8 14.9     Chemistries:    Recent Labs Lab 08/06/14 0533 08/07/14 0610 08/08/14 1338 08/09/14 0442 08/10/14 0500  NA 133* 132* 134* 137 136  K 4.7 4.4 4.1 4.2 4.7  CL 99 99 104 107 105  CO2 28 30 23 24 26   GLUCOSE 126* 122* 106* 91 104*  BUN 52* 35* 24* 23 18  CREATININE 2.00* 1.54*  1.42* 1.31 1.16  CALCIUM 6.8* 7.2* 7.7* 7.5* 7.8*  MG  --   --   --   --  1.7  AST  --   --  124* 187* 138*  ALT  --   --  71* 100* 108*  ALKPHOS  --   --  57 58 66  BILITOT  --   --  0.6 1.0 1.0    GFR Estimated Creatinine Clearance: 114.6 mL/min (by C-G formula based on Cr of 1.16).  Liver Function Tests:  Recent Labs Lab 08/08/14 1338 08/09/14 0442 08/10/14 0500  AST 124* 187* 138*  ALT 71* 100* 108*  ALKPHOS 57 58 66  BILITOT 0.6 1.0 1.0  PROT 5.2* 4.5* 4.7*  ALBUMIN 2.3*  2.1* 2.2*   No results for input(s): LIPASE, AMYLASE in the last 168 hours. No results for input(s): AMMONIA in the last 168 hours.  Urine Studies     Component Value Date/Time   COLORURINE AMBER* 08/08/2014 [place] 08/08/2014 1204   LABSPEC 1.023 08/08/2014 1204   PHURINE 5.0 08/08/2014 1204   GLUCOSEU NEGATIVE 08/08/2014 1204   HGBUR NEGATIVE 08/08/2014 1204   BILIRUBINUR NEGATIVE 08/08/2014 1204   KETONESUR 15* 08/08/2014 1204   PROTEINUR NEGATIVE 08/08/2014 1204   UROBILINOGEN 0.2 08/08/2014 1204   NITRITE NEGATIVE 08/08/2014 1204   LEUKOCYTESUR SMALL* 08/08/2014 1204    Coagulation profile  Recent Labs Lab 08/05/14 1312  INR 1.21     Imaging Studies:  None today  Assessment/Plan: 49 y.o.  1. 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 He underwent transhepatic superior mesenteric vein / portal vein (SMV/PV) lysis by interventional radiology on 08/08/14 with good response. He received tPA and heparin, infusion was discontinued on 1/7 by Interventional Radiology  Labs show Sed Rate of 29, ANA and  Rest of hypercoagulable panel is pending At this point in time, we reinforced the importance of avoiding further replacement due to recent life threatening event. In the meantime, continue IV heparin He can be transitioned to Lovenox 1mg /kg BID over the weekend After much discussions of risks and benefits, the patient elected to proceed with Pradaxa upon discharge.  Mesenteric Ischemia Partial Small Bowel Obstruction This is likely due to superior mesenteric vein thrombosis Improving after tPA infusion CEA taken on admission was 0.6 and CA 19.9 was 15.1, normal values Due to increasing abdominal pain and distention on 1/7 he was placed again on NPO, and NG  tube placement No surgical intervention is indicated as per Surgery notes  History of CVA This was after a catheterization and stent in 2011 No significant residual noted. Patient is on anticoagulation  Transamminemia In the setting of alcohol habituation and portal vein thrombosis GI workup ongoing. Hepatitis panel negative  Small Bowel Enteritis On Cipro and Flagyl day 7  Acute kidney injury Likely due to acute tubular necrosis in the setting of small bowel obstruction Resolved with IV fluids, managed by primary team.  Bilateral lower extremity edema In the setting of low albumin, ascites, portal vein thrombosis Ultrasound of lower extremities is negative for DVT  DVT prophylaxis On mechanical devices, and heparin due to vein thrombosis  Full Code  Other medical issues including cardiac disease, hypercholesterolemia, as per primary team and specialties. Overall his medical condition is stable   **Disclaimer: This note was dictated with  voice recognition software. Similar sounding words can inadvertently be transcribed and this note may contain transcription errors which may not have been corrected upon publication of note.Sharene Butters E, PA-C 08/11/2014  10:02 AM Kohler Pellerito, MD 08/11/2014

## 2014-08-12 ENCOUNTER — Inpatient Hospital Stay (HOSPITAL_COMMUNITY): Payer: BLUE CROSS/BLUE SHIELD

## 2014-08-12 LAB — CBC
HCT: 37.5 % — ABNORMAL LOW (ref 39.0–52.0)
HCT: 41.1 % (ref 39.0–52.0)
Hemoglobin: 12.4 g/dL — ABNORMAL LOW (ref 13.0–17.0)
Hemoglobin: 13.3 g/dL (ref 13.0–17.0)
MCH: 29.4 pg (ref 26.0–34.0)
MCH: 29.6 pg (ref 26.0–34.0)
MCHC: 33.1 g/dL (ref 30.0–36.0)
MCHC: 32.4 g/dL (ref 30.0–36.0)
MCV: 88.9 fL (ref 78.0–100.0)
MCV: 91.3 fL (ref 78.0–100.0)
PLATELETS: 259 10*3/uL (ref 150–400)
PLATELETS: 262 10*3/uL (ref 150–400)
RBC: 4.22 MIL/uL (ref 4.22–5.81)
RBC: 4.5 MIL/uL (ref 4.22–5.81)
RDW: 14.7 % (ref 11.5–15.5)
RDW: 14.8 % (ref 11.5–15.5)
WBC: 7.1 10*3/uL (ref 4.0–10.5)
WBC: 7 10*3/uL (ref 4.0–10.5)

## 2014-08-12 LAB — BASIC METABOLIC PANEL
Anion gap: 5 (ref 5–15)
BUN: 15 mg/dL (ref 6–23)
CALCIUM: 7.9 mg/dL — AB (ref 8.4–10.5)
CO2: 27 mmol/L (ref 19–32)
Chloride: 103 mEq/L (ref 96–112)
Creatinine, Ser: 1.24 mg/dL (ref 0.50–1.35)
GFR calc Af Amer: 77 mL/min — ABNORMAL LOW (ref >90)
GFR, EST NON AFRICAN AMERICAN: 67 mL/min — AB (ref >90)
Glucose, Bld: 119 mg/dL — ABNORMAL HIGH (ref 70–99)
POTASSIUM: 4.1 mmol/L (ref 3.5–5.1)
Sodium: 135 mmol/L (ref 135–145)

## 2014-08-12 LAB — GLUCOSE, CAPILLARY
Glucose-Capillary: 103 mg/dL — ABNORMAL HIGH (ref 70–99)
Glucose-Capillary: 113 mg/dL — ABNORMAL HIGH (ref 70–99)

## 2014-08-12 LAB — MAGNESIUM: Magnesium: 1.9 mg/dL (ref 1.5–2.5)

## 2014-08-12 LAB — PROCALCITONIN: PROCALCITONIN: 0.4 ng/mL

## 2014-08-12 LAB — HEPARIN LEVEL (UNFRACTIONATED): Heparin Unfractionated: 0.52 IU/mL (ref 0.30–0.70)

## 2014-08-12 MED ORDER — CHLORHEXIDINE GLUCONATE 0.12 % MT SOLN
15.0000 mL | Freq: Two times a day (BID) | OROMUCOSAL | Status: DC
  Filled 2014-08-12 (×4): qty 15

## 2014-08-12 MED ORDER — HYDROMORPHONE HCL 1 MG/ML IJ SOLN
1.0000 mg | INTRAMUSCULAR | Status: DC | PRN
  Administered 2014-08-12 – 2014-08-15 (×20): 1 mg via INTRAVENOUS
  Filled 2014-08-12 (×20): qty 1

## 2014-08-12 MED ORDER — CETYLPYRIDINIUM CHLORIDE 0.05 % MT LIQD
7.0000 mL | Freq: Two times a day (BID) | OROMUCOSAL | Status: DC
  Administered 2014-08-12: 7 mL via OROMUCOSAL

## 2014-08-12 NOTE — Progress Notes (Signed)
Subjective: Denies abdominal pain or bloating.  Passing gas.  Had a nonbloody BM.  Objective: Vital signs in last 24 hours: Temp:  [97.8 F (36.6 C)-99.2 F (37.3 C)] 98.6 F (37 C) (01/09 0800) Pulse Rate:  [87-101] 92 (01/09 0454) Resp:  [15-22] 18 (01/09 0800) BP: (121-164)/(72-95) 142/83 mmHg (01/09 0454) SpO2:  [92 %-98 %] 92 % (01/09 0800) Last BM Date: 08/11/14  Intake/Output from previous day: 01/08 0701 - 01/09 0700 In: 2090.4 [P.O.:120; I.V.:1870.4; IV Piggyback:100] Out: 2575 [Urine:1675; Emesis/NG output:900] Intake/Output this shift: Total I/O In: -  Out: 50 [Emesis/NG output:50]  PE: General- In NAD Abdomen-soft, nontender, active bowel sounds  Lab Results:   Recent Labs  08/12/14 0112 08/12/14 0240  WBC 7.1 7.0  HGB 12.4* 13.3  HCT 37.5* 41.1  PLT 259 262   BMET  Recent Labs  08/11/14 1246 08/11/14 1840  NA 135 136  K 4.1 3.7  CL 102 101  CO2 27 29  GLUCOSE 121* 109*  BUN 16 16  CREATININE 1.14 1.33  CALCIUM 7.9* 8.1*   PT/INR No results for input(s): LABPROT, INR in the last 72 hours. Comprehensive Metabolic Panel:    Component Value Date/Time   NA 136 08/11/2014 1840   NA 135 08/11/2014 1246   K 3.7 08/11/2014 1840   K 4.1 08/11/2014 1246   CL 101 08/11/2014 1840   CL 102 08/11/2014 1246   CO2 29 08/11/2014 1840   CO2 27 08/11/2014 1246   BUN 16 08/11/2014 1840   BUN 16 08/11/2014 1246   CREATININE 1.33 08/11/2014 1840   CREATININE 1.14 08/11/2014 1246   GLUCOSE 109* 08/11/2014 1840   GLUCOSE 121* 08/11/2014 1246   CALCIUM 8.1* 08/11/2014 1840   CALCIUM 7.9* 08/11/2014 1246   AST 138* 08/10/2014 0500   AST 187* 08/09/2014 0442   ALT 108* 08/10/2014 0500   ALT 100* 08/09/2014 0442   ALKPHOS 66 08/10/2014 0500   ALKPHOS 58 08/09/2014 0442   BILITOT 1.0 08/10/2014 0500   BILITOT 1.0 08/09/2014 0442   PROT 4.7* 08/10/2014 0500   PROT 4.5* 08/09/2014 0442   ALBUMIN 2.2* 08/10/2014 0500   ALBUMIN 2.1* 08/09/2014 0442      Studies/Results: Ir Pta Venous Right  08/11/2014   CLINICAL DATA:  Forty-eight hr portal vein and superior mesenteric vein lysis check.  EXAM: IR THROMB F/U EVAL ART/VEN FINAL DAY; THROMBOECTOMY MECHANICAL VENOUS; PTA VENOUS; IR EMBO VENOUS NOT HEMORR HEMANG INC GUIDE ROADMAPPING  FLUOROSCOPY TIME:  Sixteen minutes  MEDICATIONS AND MEDICAL HISTORY: Versed two mg, Fentanyl 100 mcg.  Additional Medications: None.  ANESTHESIA/SEDATION: Moderate sedation time: 60 minutes  CONTRAST:  50 cc Omnipaque 300  PROCEDURE: The procedure, risks, benefits, and alternatives were explained to the patient. Questions regarding the procedure were encouraged and answered. The patient understands and consents to the procedure.  The right flank was prepped with Betadine in a sterile fashion, and a sterile drape was applied covering the operative field. A sterile gown and sterile gloves were used for the procedure.  Initially, an NG tube was placed. The tip was positioned in the body of the stomach. This resulted in significant decompression.  The inner wire of the infusion catheter was removed and contrast was injected for venography.  The catheter was retracted over a Rosen wire and the Angiojet was utilized through the persistent thrombus for 156 seconds.  Repeat venography through a Kumpe catheter was performed.  The Rosen wire was reinserted and a 5 mm balloon  was utilized to dilate the thrombus within the superior mesenteric and portal veins.  Repeat venography demonstrates improvement.  A pigtail catheter was advanced over the wire to the main portal vein an utilizing twirl technique, additional mechanical thrombectomy was performed. Final venography was performed.  A copy catheter was advanced through the sheath and into the main portal vein. The catheter was retracted into the transhepatic tract. The tract was embolized with Gel-Foam slurry and several the 038 embolization coils. Two 4 mm coils and four 6 mm coils were  utilized.  FINDINGS: Initial venography demonstrates improvement overall. The most significant finding was innumerable collateral venous structures now opacifying from the peripheral aspect of the superior mesenteric venous system. 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  After performing the Angiojet, angioplasty, and pigtail technique, there was again noted to be significant improvement after each subsequent intervention. There is some partial thrombus in the most inferior portal vein, but most of the portal venous thrombus had resolved. There is chronic thrombus along the superior mesenteric vein of but there is no a large channel through the thrombus.  COMPLICATIONS: None  IMPRESSION: Successful lysed cysts and thrombectomy of thrombus throughout the portal vein and superior mesenteric vein. At the conclusion of the treatment, there are now multiple collateral venous structures within the periphery of the superior mesenteric venous system that are opacifying an allowing for decompression. There is chronic thrombus within the superior mesenteric vein, but there is now a channel with re- cannulization. In addition, most of the thrombus in the portal vein has resolved. The patient will be placed on anti coagulation in 3 hr.   Electronically Signed   By: Maryclare Bean M.D.   On: 08/11/2014 09:53   Ir Thrombect Veno Mech Mod Sed  08/11/2014   CLINICAL DATA:  Forty-eight hr portal vein and superior mesenteric vein lysis check.  EXAM: IR THROMB F/U EVAL ART/VEN FINAL DAY; THROMBOECTOMY MECHANICAL VENOUS; PTA VENOUS; IR EMBO VENOUS NOT HEMORR HEMANG INC GUIDE ROADMAPPING  FLUOROSCOPY TIME:  Sixteen minutes  MEDICATIONS AND MEDICAL HISTORY: Versed two mg, Fentanyl 100 mcg.  Additional Medications: None.  ANESTHESIA/SEDATION: Moderate sedation time: 60 minutes  CONTRAST:  50 cc Omnipaque 300  PROCEDURE: The procedure, risks, benefits, and  alternatives were explained to the patient. Questions regarding the procedure were encouraged and answered. The patient understands and consents to the procedure.  The right flank was prepped with Betadine in a sterile fashion, and a sterile drape was applied covering the operative field. A sterile gown and sterile gloves were used for the procedure.  Initially, an NG tube was placed. The tip was positioned in the body of the stomach. This resulted in significant decompression.  The inner wire of the infusion catheter was removed and contrast was injected for venography.  The catheter was retracted over a Rosen wire and the Angiojet was utilized through the persistent thrombus for 156 seconds.  Repeat venography through a Kumpe catheter was performed.  The Rosen wire was reinserted and a 5 mm balloon was utilized to dilate the thrombus within the superior mesenteric and portal veins.  Repeat venography demonstrates improvement.  A pigtail catheter was advanced over the wire to the main portal vein an utilizing twirl technique, additional mechanical thrombectomy was performed. Final venography was performed.  A copy catheter was advanced through the sheath and into the main portal vein. The catheter was retracted into the transhepatic tract. The  tract was embolized with Gel-Foam slurry and several the 038 embolization coils. Two 4 mm coils and four 6 mm coils were utilized.  FINDINGS: Initial venography demonstrates improvement overall. The most significant finding was innumerable collateral venous structures now opacifying from the peripheral aspect of the superior mesenteric venous system. 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  After performing the Angiojet, angioplasty, and pigtail technique, there was again noted to be significant improvement after each subsequent intervention. There is some partial thrombus in the most inferior  portal vein, but most of the portal venous thrombus had resolved. There is chronic thrombus along the superior mesenteric vein of but there is no a large channel through the thrombus.  COMPLICATIONS: None  IMPRESSION: Successful lysed cysts and thrombectomy of thrombus throughout the portal vein and superior mesenteric vein. At the conclusion of the treatment, there are now multiple collateral venous structures within the periphery of the superior mesenteric venous system that are opacifying an allowing for decompression. There is chronic thrombus within the superior mesenteric vein, but there is now a channel with re- cannulization. In addition, most of the thrombus in the portal vein has resolved. The patient will be placed on anti coagulation in 3 hr.   Electronically Signed   By: Maryclare Bean M.D.   On: 08/11/2014 09:53   Dg Chest Port 1 View  08/11/2014   CLINICAL DATA:  Hypoxia.  EXAM: PORTABLE CHEST - 1 VIEW  COMPARISON:  August 10, 2014.  FINDINGS: Stable cardiomediastinal silhouette. No pneumothorax or significant pleural effusion is noted. Nasogastric tube tip is seen in expected position of proximal stomach. Left lung is clear. Right lower lobe opacity noted on prior exam is slightly improved, although significant residual density remains concerning for atelectasis or pneumonia. Bony thorax appears intact.  IMPRESSION: Nasogastric tube tip seen in proximal stomach.  Improved right basilar opacity is noted, although significant residual density remains consistent with pneumonia or subsegmental atelectasis. Continued radiographic follow-up is recommended until resolution.   Electronically Signed   By: Sabino Dick M.D.   On: 08/11/2014 08:30   Dg Chest Port 1 View  08/10/2014   CLINICAL DATA:  49 year old male with increasing shortness of breath  EXAM: PORTABLE CHEST - 1 VIEW  COMPARISON:  Prior chest x-ray 04/17/2013  FINDINGS: Low inspiratory volumes. Nonspecific right mid and lower lung airspace  opacity. Suspect a small layering effusion. No pulmonary edema. Cardiac and mediastinal contours are within normal limits. No pneumothorax. No acute osseous abnormality.  IMPRESSION: Low inspiratory volumes with new right mid and lower lung opacities. Favored to reflect a small right layering pleural effusion with associated atelectasis. Right lower lobe infiltrate, or mild alveolar hemorrhage are also within the differential.   Electronically Signed   By: Jacqulynn Cadet M.D.   On: 08/10/2014 10:02   Dg Abd 2 Views  08/12/2014   CLINICAL DATA:  Adynamic ileus. Post catheter directed thrombolysis of the SMV and portal vein.  EXAM: ABDOMEN - 2 VIEW  COMPARISON:  08/11/2014 ; 08/10/2014; CT abdomen pelvis - 08/07/2014; abdominal MRI - 08/05/2014; catheter directed thrombolysis - 08/10/2014  FINDINGS: Enteric contrast is now seen extending throughout the colon to the level of the rectum. There is persistent marked gaseous distension of multiple upstream loops of small bowel with index loop within the left mid hemiabdomen measuring approximately 6.2 cm in maximal diameter. No definite pneumoperitoneum, pneumatosis or portal venous gas.  Enteric tube  tip and side port projects over the gastric fundus.  Limited visualization lower thorax demonstrates a suspected small right-sided pleural effusion with associated right basilar heterogeneous/consolidative opacities.  Embolization coils overlie the peripheral / lateral aspect of the right lobe of the liver. No definite abnormal intra-abdominal calcifications.  Post bilateral hernia repair.  No acute osseus abnormalities.  IMPRESSION: 1. Findings suggestive of persistent though potentially minimally improved ileus with interval transit of ingested contrast, now seen extending to the level of the rectum. Given marked distention of multiple loops of small bowel, continued attention on follow-up is recommended. 2. Suspected small right-sided effusion with associated right  basilar opacities, possibly atelectasis.   Electronically Signed   By: Sandi Mariscal M.D.   On: 08/12/2014 08:31   Dg Abd Portable 1v  08/11/2014   CLINICAL DATA:  Small bowel obstruction.  EXAM: PORTABLE ABDOMEN - 1 VIEW  COMPARISON:  August 10, 2014.  FINDINGS: There remains multiple dilated loops of small bowel concerning for distal small bowel obstruction. These are stable compared to prior exam. No significant colonic dilatation is noted. No renal calculi are noted.  IMPRESSION: Stable dilated small bowel loops are noted consistent with distal small bowel obstruction. Continued radiographic follow-up is recommended.   Electronically Signed   By: Sabino Dick M.D.   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 There remains some contrast within the proximal colon stable from the prior study. Postsurgical changes are noted. An infusion catheter is seen within the portal vein and superior mesenteric vein. No free air is seen.  IMPRESSION: Increasing small bowel dilatation when compared with the prior exam.   Electronically Signed   By: Inez Catalina M.D.   On: 08/10/2014 10:01   Ir Embo Venous Not Wink Guide Roadmapping  08/11/2014   CLINICAL DATA:  Forty-eight hr portal vein and superior mesenteric vein lysis check.  EXAM: IR THROMB F/U EVAL ART/VEN FINAL DAY; THROMBOECTOMY MECHANICAL VENOUS; PTA VENOUS; IR EMBO VENOUS NOT HEMORR HEMANG INC GUIDE ROADMAPPING  FLUOROSCOPY TIME:  Sixteen minutes  MEDICATIONS AND MEDICAL HISTORY: Versed two mg, Fentanyl 100 mcg.  Additional Medications: None.  ANESTHESIA/SEDATION: Moderate sedation time: 60 minutes  CONTRAST:  50 cc Omnipaque 300  PROCEDURE: The procedure, risks, benefits,  and alternatives were explained to the patient. Questions regarding the procedure were encouraged and answered. The patient understands and consents to the procedure.  The right flank was prepped with Betadine in a sterile fashion, and a sterile drape was applied covering the operative field. A sterile gown and sterile gloves were used for the procedure.  Initially, an NG tube was placed. The tip was positioned in the body of the stomach. This resulted in significant decompression.  The inner wire of the infusion catheter was removed and contrast was injected for venography.  The catheter was retracted over a Rosen wire and the Angiojet was utilized through the persistent thrombus for 156 seconds.  Repeat venography through a Kumpe catheter was performed.  The Rosen wire was reinserted and a 5 mm balloon was utilized to dilate the thrombus within the superior mesenteric and portal veins.  Repeat venography demonstrates improvement.  A pigtail catheter was advanced over the  wire to the main portal vein an utilizing twirl technique, additional mechanical thrombectomy was performed. Final venography was performed.  A copy catheter was advanced through the sheath and into the main portal vein. The catheter was retracted into the transhepatic tract. The tract was embolized with Gel-Foam slurry and several the 038 embolization coils. Two 4 mm coils and four 6 mm coils were utilized.  FINDINGS: Initial venography demonstrates improvement overall. The most significant finding was innumerable collateral venous structures now opacifying from the peripheral aspect of the superior mesenteric venous system. 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  After performing the Angiojet, angioplasty, and pigtail technique, there was again noted to be significant improvement after each subsequent intervention. There is some partial thrombus in the most  inferior portal vein, but most of the portal venous thrombus had resolved. There is chronic thrombus along the superior mesenteric vein of but there is no a large channel through the thrombus.  COMPLICATIONS: None  IMPRESSION: Successful lysed cysts and thrombectomy of thrombus throughout the portal vein and superior mesenteric vein. At the conclusion of the treatment, there are now multiple collateral venous structures within the periphery of the superior mesenteric venous system that are opacifying an allowing for decompression. There is chronic thrombus within the superior mesenteric vein, but there is now a channel with re- cannulization. In addition, most of the thrombus in the portal vein has resolved. The patient will be placed on anti coagulation in 3 hr.   Electronically Signed   By: Maryclare Bean M.D.   On: 08/11/2014 09:53   Ir Jacolyn Reedy F/u Eval Art/ven Final Day (ms)  08/11/2014   CLINICAL DATA:  Forty-eight hr portal vein and superior mesenteric vein lysis check.  EXAM: IR THROMB F/U EVAL ART/VEN FINAL DAY; THROMBOECTOMY MECHANICAL VENOUS; PTA VENOUS; IR EMBO VENOUS NOT HEMORR HEMANG INC GUIDE ROADMAPPING  FLUOROSCOPY TIME:  Sixteen minutes  MEDICATIONS AND MEDICAL HISTORY: Versed two mg, Fentanyl 100 mcg.  Additional Medications: None.  ANESTHESIA/SEDATION: Moderate sedation time: 60 minutes  CONTRAST:  50 cc Omnipaque 300  PROCEDURE: The procedure, risks, benefits, and alternatives were explained to the patient. Questions regarding the procedure were encouraged and answered. The patient understands and consents to the procedure.  The right flank was prepped with Betadine in a sterile fashion, and a sterile drape was applied covering the operative field. A sterile gown and sterile gloves were used for the procedure.  Initially, an NG tube was placed. The tip was positioned in the body of the stomach. This resulted in significant decompression.  The inner wire of the infusion catheter was removed and  contrast was injected for venography.  The catheter was retracted over a Rosen wire and the Angiojet was utilized through the persistent thrombus for 156 seconds.  Repeat venography through a Kumpe catheter was performed.  The Rosen wire was reinserted and a 5 mm balloon was utilized to dilate the thrombus within the superior mesenteric and portal veins.  Repeat venography demonstrates improvement.  A pigtail catheter was advanced over the wire to the main portal vein an utilizing twirl technique, additional mechanical thrombectomy was performed. Final venography was performed.  A copy catheter was advanced through the sheath and into the main portal vein. The catheter was retracted into the transhepatic tract. The tract was embolized with Gel-Foam slurry and several the 038 embolization coils. Two 4 mm coils and four 6 mm coils were utilized.  FINDINGS: Initial venography demonstrates improvement overall. The most significant finding was innumerable collateral venous structures now opacifying from the peripheral aspect of the superior mesenteric venous system. 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  After performing the Angiojet, angioplasty, and pigtail technique, there was again noted to be significant improvement after each subsequent intervention. There is some partial thrombus in the most inferior portal vein, but most of the portal venous thrombus had resolved. There is chronic thrombus along the superior mesenteric vein of but there is no a large channel through the thrombus.  COMPLICATIONS: None  IMPRESSION: Successful lysed cysts and thrombectomy of thrombus throughout the portal vein and superior mesenteric vein. At the conclusion of the treatment, there are now multiple collateral venous structures within the periphery of the superior mesenteric venous system that are opacifying an allowing for decompression. There is  chronic thrombus within the superior mesenteric vein, but there is now a channel with re- cannulization. In addition, most of the thrombus in the portal vein has resolved. The patient will be placed on anti coagulation in 3 hr.   Electronically Signed   By: Maryclare Bean M.D.   On: 08/11/2014 09:53    Anti-infectives: Anti-infectives    Start     Dose/Rate Route Frequency Ordered Stop   08/08/14 1418  ceFAZolin (ANCEF) IVPB 2 g/50 mL premix     2 g100 mL/hr over 30 Minutes Intravenous 60 min pre-op 08/08/14 1413 08/08/14 1530   08/04/14 0845  ciprofloxacin (CIPRO) IVPB 400 mg  Status:  Discontinued     400 mg200 mL/hr over 60 Minutes Intravenous Every 12 hours 08/04/14 0834 08/11/14 1053   08/04/14 0845  metroNIDAZOLE (FLAGYL) IVPB 500 mg  Status:  Discontinued     500 mg100 mL/hr over 60 Minutes Intravenous Every 8 hours 08/04/14 0834 08/11/14 1053      Assessment 1. SMV thrombus, s/p transhepatic thrombolysis by IR-no peritonitis 2. Ileus-resolving   LOS: 8 days   Plan: Clamp ng, clear liquids.   Erian Rosengren J 08/12/2014

## 2014-08-12 NOTE — Progress Notes (Signed)
[facility] TEAM 1 - Stepdown/ICU TEAM Progress Note  DIXIE JAFRI ZCH:[id number] DOB: [birth year] DOA: 08/04/2014 PCP: [name], MD  Admit HPI / Brief Narrative: 49 yo M who presented 08/04/14 with 4 days diffuse abd pain, found to have small bowel enteritis along with occlusive thrombus in SMV extending through main portal vein. Underwent lysis by IR on 08/08/14 and returned to ICU on tPA infusion.  Significant Events: 01/01 - admit 01/05 - lysis by IR, placed on tPA infusion 01/06 - PCCM consulted to take over as primary team, repeat eval by IR, large thrombus remaining, tPA to continue for another 24 hours 1/07- Abd port xray: Increasing small bowel dilatation when compared with the prior exam.  HPI/Subjective: Pt feels much better today.  He has tolerated a clear liquid meal and wishes to have his NG pulled out.  He denies cp, n/v, sob, or HA.  No abdom pain this morning.    Assessment/Plan:  SMV and portal vein thrombus  S/p catheter directed thrombolytic therapy d/c'd 1/7 - provoked by weekly testosterone injections in setting of multiple other risk factors - Heme has ok'd transition to lovenox, w/ plan for Pradaxa at d/c - NO MORE hormone replacement   Ileus - mesenteric ischemia w/ PSBP Per Gen Surg - improving - slowly advancing diet   Obesity - Body mass index is 39.5 kg/(m^2).   Hx of CVA  CAD s/p PCI 2013  Transaminitis  Hepatitis panel negative - likely due to ETOH as well as portal vein thrombosis  Acute kindey injury Resolved  B LE edema  In the setting of low albumin, ascites, portal vein thrombosis - [place] of lower extremities is negative for DVT  Code Status: FULL Family Communication: spoke w/ mother at bedside  Disposition Plan: SDU   Consultants: IR PCCM  Gen Surg Hematology   Procedures: CT Abd 01/01 >>> several loops of abnormal enlarged thick walled small bowel loops with surrounding inflammation and fluid in RLQ. US Renal 01/02 >>>  prior small bowel wall thickening in RLQ is attributed to likely SMV thrombosis. Small amt of ascites adjacent to the right hepatic lobe. Complex lesion of left mid kidney. MRI 01/02 >>> Occlusive thrombus in SMV extending into main portal vein. Abdominal ascites, left renal cyst. CT abd 01/04 >>> persistent small bowel thickening. Increased small bowel dilatation suspicious for SBO, increase in diffuse mesenteric edema and ascites.  Antibiotics: Cipro 1/1>>>1/8 Flagyl 1/1>>1/8  DVT prophylaxis: lovenox  Objective: Blood pressure 143/76, pulse 95, temperature 98.6 F (37 C), temperature source Oral, resp. rate 18, height 6\' 2"  (1.88 m), weight 139.6 kg (307 lb 12.2 oz), SpO2 92 %.  Intake/Output Summary (Last 24 hours) at 08/12/14 1244 Last data filed at 08/12/14 1100  Gross per 24 hour  Intake 1618.5 ml  Output   2225 ml  Net -606.5 ml   Exam: General: No acute respiratory distress Lungs: Clear to auscultation bilaterally without wheezes or crackles Cardiovascular: Regular rate and rhythm without murmur gallop or rub normal S1 and S2 Abdomen: Nontender, nondistended, soft, bowel sounds positive, no rebound, no ascites, no appreciable mass Extremities: No significant cyanosis, clubbing;  1+edema bilateral lower extremities  Data Reviewed: Basic Metabolic Panel:  Recent Labs Lab 08/09/14 0442 08/10/14 0500 08/11/14 1246 08/11/14 1840 08/12/14 0240 08/12/14 1025  NA 137 136 135 136  --  135  K 4.2 4.7 4.1 3.7  --  4.1  CL 107 105 102 101  --  103  CO2  24 26 27 29   --  27  GLUCOSE 91 104* 121* 109*  --  119*  BUN 23 18 16 16   --  15  CREATININE 1.31 1.16 1.14 1.33  --  1.24  CALCIUM 7.5* 7.8* 7.9* 8.1*  --  7.9*  MG  --  1.7 1.7  --  1.9  --   PHOS  --  3.0 3.3  --   --   --    Liver Function Tests:  Recent Labs Lab 08/08/14 1338 08/09/14 0442 08/10/14 0500  AST 124* 187* 138*  ALT 71* 100* 108*  ALKPHOS 57 58 66  BILITOT 0.6 1.0 1.0  PROT 5.2* 4.5* 4.7*    ALBUMIN 2.3* 2.1* 2.2*   Coags:  Recent Labs Lab 08/05/14 1312  INR 1.21    Recent Labs Lab 08/05/14 1312  APTT 28   CBC:  Recent Labs Lab 08/10/14 1100 08/11/14 0845 08/11/14 1840 08/12/14 0112 08/12/14 0240  WBC 7.5 6.7 7.4 7.1 7.0  HGB 13.0 12.9* 12.7* 12.4* 13.3  HCT 40.6 40.5 38.7* 37.5* 41.1  MCV 90.8 90.2 89.6 88.9 91.3  PLT 276 244 251 259 262   CBG:  Recent Labs Lab 08/09/14 1937 08/10/14 0501 08/10/14 0821 08/11/14 0829 08/12/14 0819  GLUCAP 113* 90 120* 100* 113*    Recent Results (from the past 240 hour(s))  MRSA PCR Screening     Status: None   Collection Time: 08/08/14  5:21 PM  Result Value Ref Range Status   MRSA by PCR NEGATIVE NEGATIVE Final    Comment:        The GeneXpert MRSA Assay (FDA approved for NASAL specimens only), is one component of a comprehensive MRSA colonization surveillance program. It is not intended to diagnose MRSA infection nor to guide or monitor treatment for MRSA infections.      Studies:  Recent x-ray studies have been reviewed in detail by the Attending Physician  Scheduled Meds:  Scheduled Meds: . allopurinol  300 mg Oral Daily  . antiseptic oral rinse  7 mL Mouth Rinse q12n4p  . chlorhexidine  15 mL Mouth Rinse BID  . HYDROmorphone PCA 0.3 mg/mL   Intravenous 6 times per day  . metoprolol succinate  50 mg Oral Daily  . omega-3 acid ethyl esters  1 g Oral Daily  . pantoprazole (PROTONIX) IV  40 mg Intravenous Q12H  . sodium chloride  3 mL Intravenous Q12H    Time spent on care of this patient: 35 mins   Orene Abbasi T , MD   [facility] Office  [phone] Pager - Text Page per Shea Evans as per below:  On-Call/Text Page:      [email or web address]      password [email or web address]  If 7PM-7AM, please contact night-coverage [email or web address] Password [email or web address] 08/12/2014, 12:44 PM   LOS: 8 days

## 2014-08-12 NOTE — Progress Notes (Addendum)
Paged MD about NG tube, pt increasingly agitated, threatening to pull out tube due to discomfort. Pt on clear liquids, positive bowel sounds, 2 bowel movements this shift. MD gave order to d/c NG tube. Will continue to monitor.

## 2014-08-12 NOTE — Progress Notes (Signed)
ANTICOAGULATION CONSULT NOTE - Follow Up Consult  Pharmacy Consult for Heparin  Indication: Mesenteric vein thrombosis  Allergies  Allergen Reactions  . Ace Inhibitors Cough  . Lipitor [Atorvastatin] Other (See Comments)    myalgia  . Codeine Itching    Patient Measurements: Height: 6\' 2"  (188 cm) Weight: (!) 307 lb 12.2 oz (139.6 kg) IBW/kg (Calculated) : 82.2   Vital Signs: Temp: 98.6 F (37 C) (01/09 0800) Temp Source: Oral (01/09 0800) BP: 142/83 mmHg (01/09 0454) Pulse Rate: 92 (01/09 0454)  Labs:  Recent Labs  08/10/14 0500  08/11/14 0050 08/11/14 0840  08/11/14 1246 08/11/14 1840 08/12/14 0112 08/12/14 0240  HGB 13.0  < >  --   --   < >  --  12.7* 12.4* 13.3  HCT 40.3  < >  --   --   < >  --  38.7* 37.5* 41.1  PLT 267  < >  --   --   < >  --  251 259 262  HEPARINUNFRC 0.36  < > 0.29* 0.34  --   --   --   --  0.52  CREATININE 1.16  --   --   --   --  1.14 1.33  --   --   < > = values in this interval not displayed.  Estimated Creatinine Clearance: 100 mL/min (by C-G formula based on Cr of 1.33).   Assessment: Pt on heparin for SMV thrombus- s/p cath directed lysis with alteplase 1/5-1/7. Heparin level therapeutic x2. Pt had bloody BM which is to be expected per Dr. Pascal Lux due to concern for ischemia on recent abd CT, most recently had a non-bloody BM. CBC remains stable.  Goal of Therapy:  Heparin level 0.3-0.7 units/ml Monitor platelets by anticoagulation protocol: Yes   Plan:  -Continue heparin at 2300 units/hr -F/u daily heparin level and CBC -Monitor for further bleeding  Drucie Opitz, PharmD Clinical Pharmacy Resident Pager: [phone] 08/12/2014 9:13 AM

## 2014-08-12 NOTE — Progress Notes (Signed)
Referring Physician(s): TRH  Subjective: Patient states he feels much better today, he previously was having 10/10 abdominal pain prior to the procedure and now c/o minimal to no pain. He admits to passing gas and had a non-bloody BM this morning. He is tolerating liquids without N/V.  Allergies: Ace inhibitors; Lipitor; and Codeine  Medications: Prior to Admission medications   Medication Sig Start Date End Date Taking? Authorizing Provider  allopurinol (ZYLOPRIM) 300 MG tablet Take 1 tablet by mouth daily. 07/03/14  Yes Historical Provider, MD  aspirin 81 MG chewable tablet Chew 1 tablet (81 mg total) by mouth daily. 07/07/12  Yes Luke K Kilroy, PA-C  azithromycin (ZITHROMAX) 500 MG tablet See admin instructions. 3 tabs on day 1, 2 tabs daily on days 2 and 3 07/25/14  Yes Historical Provider, MD  cefdinir (OMNICEF) 300 MG capsule Take 1 capsule by mouth 2 (two) times daily. 07/29/14  Yes Historical Provider, MD  furosemide (LASIX) 20 MG tablet Take 20 mg by mouth daily.   Yes Historical Provider, MD  Liraglutide -Weight Management (SAXENDA) 18 MG/3ML SOPN Inject 3 mLs into the skin daily.   Yes Historical Provider, MD  metoprolol succinate (TOPROL-XL) 50 MG 24 hr tablet Take 50 mg by mouth daily. Take with or immediately following a meal.   Yes Historical Provider, MD  olmesartan-hydrochlorothiazide (BENICAR HCT) 40-25 MG per tablet Take 1 tablet by mouth daily.   Yes Historical Provider, MD  omega-3 acid ethyl esters (LOVAZA) 1 G capsule Take 1 capsule (1 g total) by mouth daily. <please make appointment>   Yes Brittainy M Simmons, PA-C  predniSONE (STERAPRED UNI-PAK) 5 MG TABS tablet See admin instructions. 6 tabs on the first day, decreasing by 1 tab daily until gone 07/29/14  Yes Historical Provider, MD  testosterone cypionate (DEPOTESTOTERONE CYPIONATE) 200 MG/ML injection Inject 1 mL into the muscle once a week. Wednesday 06/07/14  Yes Historical Provider, MD  acetaminophen (TYLENOL)  325 MG tablet Take 2 tablets (650 mg total) by mouth every 4 (four) hours as needed. 04/19/13   Erlene Quan, PA-C  fenofibrate 160 MG tablet Take 1 tablet (160 mg total) by mouth daily. Patient not taking: Reported on 08/04/2014 04/18/13   Brittainy M Rosita Fire, PA-C  metoprolol succinate (TOPROL-XL) 25 MG 24 hr tablet Take 1 tablet (25 mg total) by mouth daily. <please make appointment> Patient not taking: Reported on 08/04/2014    Erlene Quan, PA-C  nitroGLYCERIN (NITROSTAT) 0.4 MG SL tablet Place 1 tablet (0.4 mg total) under the tongue every 5 (five) minutes x 3 doses as needed for chest pain. 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  EXAM: IR ULTRASOUND GUIDANCE VASC ACCESS RIGHT; THROMBOECTOMY MECHANICAL VENOUS; IR INFUSION THROMBOL VENOUS INITIAL (MS); PORTAL VENOGRAPHY; ADDITIONAL ARTERIOGRAPHY  FLUOROSCOPY TIME:  8 min and 6 seconds.  MEDICATIONS AND MEDICAL HISTORY: Versed 4 mg, Fentanyl 175 mcg.  As antibiotic prophylaxis, Ancef was ordered pre-procedure and administered intravenously within  one hour of incision.  ANESTHESIA/SEDATION: Moderate sedation time: 52 minutes  CONTRAST:  50 cc Omnipaque 300.  Carbon dioxide.  PROCEDURE: The procedure, risks, benefits, and alternatives were explained to the patient. Questions regarding the procedure were encouraged and answered. The patient understands and consents to the procedure.  The right flank was prepped with Betadine in a sterile fashion, and a sterile drape was applied covering the operative field. A  sterile gown and sterile gloves were used for the procedure.  1% lidocaine was utilized for local infiltration. Under sonographic guidance, a 22 gauge Chiba needle was inserted into the peripheral aspect of the main right portal vein. Contrast was injected confirming needle position. The needle was removed over a 018 wire. The Accustick dilator was inserted. A small amount of contrast was injected. Carbon dioxide portal venography was performed.  The Accustick dilator was exchanged over the 3 J for a 5 [place] Kumpe the catheter. This was advanced into the lower portal vein and venography was performed. The catheter was advanced into the upper SMV and contrast was injected. There is partially occlusive thrombus in the main portal vein and occlusive thrombus in the SMV.  The catheter was carefully advanced over a glidewire to the small venous branches of the distal ileum in the right lower quadrant. Contrast was injected demonstrating stasis of flow within the small branches adjacent to the intestines and complete occlusion within the larger more central branches.  The Kumpe the catheter was removed over a Rosen wire. A 6 French sheath was advanced over the wire to the upper main portal vein. The Angiojet was utilized to mechanically lysed the thrombus for 96 seconds.  Subsequently, a 90 cm 20 cm infusion length 5 French multi side-hole catheter was advanced over the [place] wire to the right lower quadrant. The catheter is positioned along side the thrombus extending from the main portal vein to the ileum. Lysis with 0.5 mg tPA was then instituted. Heparin protocol was resumed per peripheral access.  FINDINGS: Portal venography confirms partial occlusion of the main portal vein and complete occlusion of the SMV. Splenic vein is patent.  Subsequent venogram images confirmed occlusion of mesenteric brain branches to the right lower quadrant. Peripheral branches adjacent to the intestine are patent with stasis of flow.   The neck series of images demonstrate placement of a 6 French sheath and multi side hole catheter along side the thrombus, extending from the main portal vein to the small mesenteric venous branches in the right lower quadrant.  COMPLICATIONS: None  IMPRESSION: Successful mechanical and pharmacological lysis of thrombus in the main portal vein, superior mesenteric vein, and venous branches in the right lower quadrant. TPA had 0.5 milligrams/hour will be instituted via multi side-hole catheter along side the thrombus.   Electronically Signed   By: Maryclare Bean M.D.   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  EXAM: IR ULTRASOUND GUIDANCE VASC ACCESS RIGHT; THROMBOECTOMY MECHANICAL VENOUS; IR INFUSION THROMBOL VENOUS INITIAL (MS); PORTAL VENOGRAPHY; ADDITIONAL ARTERIOGRAPHY  FLUOROSCOPY TIME:  8 min and 6 seconds.  MEDICATIONS AND MEDICAL HISTORY: Versed 4 mg, Fentanyl 175 mcg.  As antibiotic prophylaxis, Ancef was ordered pre-procedure and administered intravenously within one hour of incision.  ANESTHESIA/SEDATION: Moderate sedation time: 52 minutes  CONTRAST:  50 cc Omnipaque 300.  Carbon dioxide.  PROCEDURE: The procedure, risks, benefits, and alternatives were explained to  the patient. Questions regarding the procedure were encouraged and answered. The patient understands and consents to the procedure.  The right flank was prepped with Betadine in a sterile fashion, and a sterile drape was applied covering the operative field. A sterile gown and sterile gloves were used for the procedure.  1% lidocaine was utilized for local infiltration. Under sonographic guidance, a 22 gauge Chiba needle was inserted into the peripheral aspect of the main right portal vein. Contrast was injected confirming needle position. The needle was removed over a 018 wire. The Accustick dilator was inserted. A small amount of  contrast was injected. Carbon dioxide portal venography was performed.  The Accustick dilator was exchanged over the 3 J for a 5 [place] Kumpe the catheter. This was advanced into the lower portal vein and venography was performed. The catheter was advanced into the upper SMV and contrast was injected. There is partially occlusive thrombus in the main portal vein and occlusive thrombus in the SMV.  The catheter was carefully advanced over a glidewire to the small venous branches of the distal ileum in the right lower quadrant. Contrast was injected demonstrating stasis of flow within the small branches adjacent to the intestines and complete occlusion within the larger more central branches.  The Kumpe the catheter was removed over a Rosen wire. A 6 French sheath was advanced over the wire to the upper main portal vein. The Angiojet was utilized to mechanically lysed the thrombus for 96 seconds.  Subsequently, a 90 cm 20 cm infusion length 5 French multi side-hole catheter was advanced over the [place] wire to the right lower quadrant. The catheter is positioned along side the thrombus extending from the main portal vein to the ileum. Lysis with 0.5 mg tPA was then instituted. Heparin protocol was resumed per peripheral access.  FINDINGS: Portal venography confirms partial occlusion of the main portal vein and complete occlusion of the SMV. Splenic vein is patent.  Subsequent venogram images confirmed occlusion of mesenteric brain branches to the right lower quadrant. Peripheral branches adjacent to the intestine are patent with stasis of flow.  The neck series of images demonstrate placement of a 6 French sheath and multi side hole catheter along side the thrombus, extending from the main portal vein to the small mesenteric venous branches in the right lower quadrant.  COMPLICATIONS: None  IMPRESSION: Successful mechanical and pharmacological lysis of thrombus in the main portal vein, superior mesenteric vein, and  venous branches in the right lower quadrant. TPA had 0.5 milligrams/hour will be instituted via multi side-hole catheter along side the thrombus.   Electronically Signed   By: Maryclare Bean M.D.   On: 08/08/2014 17:21   Ir Pta Venous Right  08/11/2014   CLINICAL DATA:  Forty-eight hr portal vein and superior mesenteric vein lysis check.  EXAM: IR THROMB F/U EVAL ART/VEN FINAL DAY; THROMBOECTOMY MECHANICAL VENOUS; PTA VENOUS; IR EMBO VENOUS NOT HEMORR HEMANG INC GUIDE ROADMAPPING  FLUOROSCOPY TIME:  Sixteen minutes  MEDICATIONS AND MEDICAL HISTORY: Versed two mg, Fentanyl 100 mcg.  Additional Medications: None.  ANESTHESIA/SEDATION: Moderate sedation time: 60 minutes  CONTRAST:  50 cc Omnipaque 300  PROCEDURE: The procedure, risks, benefits, and alternatives were explained to the patient. Questions regarding the procedure were encouraged and answered. The patient understands and consents to the procedure.  The right flank was prepped with Betadine in a sterile fashion, and a sterile drape was applied covering the operative field. A sterile gown and sterile gloves were used for  the procedure.  Initially, an NG tube was placed. The tip was positioned in the body of the stomach. This resulted in significant decompression.  The inner wire of the infusion catheter was removed and contrast was injected for venography.  The catheter was retracted over a Rosen wire and the Angiojet was utilized through the persistent thrombus for 156 seconds.  Repeat venography through a Kumpe catheter was performed.  The Rosen wire was reinserted and a 5 mm balloon was utilized to dilate the thrombus within the superior mesenteric and portal veins.  Repeat venography demonstrates improvement.  A pigtail catheter was advanced over the wire to the main portal vein an utilizing twirl technique, additional mechanical thrombectomy was performed. Final venography was performed.  A copy catheter was advanced through the sheath and into the main  portal vein. The catheter was retracted into the transhepatic tract. The tract was embolized with Gel-Foam slurry and several the 038 embolization coils. Two 4 mm coils and four 6 mm coils were utilized.  FINDINGS: Initial venography demonstrates improvement overall. The most significant finding was innumerable collateral venous structures now opacifying from the peripheral aspect of the superior mesenteric venous system. 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  After performing the Angiojet, angioplasty, and pigtail technique, there was again noted to be significant improvement after each subsequent intervention. There is some partial thrombus in the most inferior portal vein, but most of the portal venous thrombus had resolved. There is chronic thrombus along the superior mesenteric vein of but there is no a large channel through the thrombus.  COMPLICATIONS: None  IMPRESSION: Successful lysed cysts and thrombectomy of thrombus throughout the portal vein and superior mesenteric vein. At the conclusion of the treatment, there are now multiple collateral venous structures within the periphery of the superior mesenteric venous system that are opacifying an allowing for decompression. There is chronic thrombus within the superior mesenteric vein, but there is now a channel with re- cannulization. In addition, most of the thrombus in the portal vein has resolved. The patient will be placed on anti coagulation in 3 hr.   Electronically Signed   By: Maryclare Bean M.D.   On: 08/11/2014 09:53   Ir Thrombect Veno Mech Mod Sed  08/11/2014   CLINICAL DATA:  Forty-eight hr portal vein and superior mesenteric vein lysis check.  EXAM: IR THROMB F/U EVAL ART/VEN FINAL DAY; THROMBOECTOMY MECHANICAL VENOUS; PTA VENOUS; IR EMBO VENOUS NOT HEMORR HEMANG INC GUIDE ROADMAPPING  FLUOROSCOPY TIME:  Sixteen minutes  MEDICATIONS AND MEDICAL HISTORY: Versed two  mg, Fentanyl 100 mcg.  Additional Medications: None.  ANESTHESIA/SEDATION: Moderate sedation time: 60 minutes  CONTRAST:  50 cc Omnipaque 300  PROCEDURE: The procedure, risks, benefits, and alternatives were explained to the patient. Questions regarding the procedure were encouraged and answered. The patient understands and consents to the procedure.  The right flank was prepped with Betadine in a sterile fashion, and a sterile drape was applied covering the operative field. A sterile gown and sterile gloves were used for the procedure.  Initially, an NG tube was placed. The tip was positioned in the body of the stomach. This resulted in significant decompression.  The inner wire of the infusion catheter was removed and contrast was injected for venography.  The catheter was retracted over a Rosen wire and the Angiojet was utilized through the persistent thrombus for 156 seconds.  Repeat venography through a Kumpe catheter was  performed.  The Rosen wire was reinserted and a 5 mm balloon was utilized to dilate the thrombus within the superior mesenteric and portal veins.  Repeat venography demonstrates improvement.  A pigtail catheter was advanced over the wire to the main portal vein an utilizing twirl technique, additional mechanical thrombectomy was performed. Final venography was performed.  A copy catheter was advanced through the sheath and into the main portal vein. The catheter was retracted into the transhepatic tract. The tract was embolized with Gel-Foam slurry and several the 038 embolization coils. Two 4 mm coils and four 6 mm coils were utilized.  FINDINGS: Initial venography demonstrates improvement overall. The most significant finding was innumerable collateral venous structures now opacifying from the peripheral aspect of the superior mesenteric venous system. 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  After performing  the Angiojet, angioplasty, and pigtail technique, there was again noted to be significant improvement after each subsequent intervention. There is some partial thrombus in the most inferior portal vein, but most of the portal venous thrombus had resolved. There is chronic thrombus along the superior mesenteric vein of but there is no a large channel through the thrombus.  COMPLICATIONS: None  IMPRESSION: Successful lysed cysts and thrombectomy of thrombus throughout the portal vein and superior mesenteric vein. At the conclusion of the treatment, there are now multiple collateral venous structures within the periphery of the superior mesenteric venous system that are opacifying an allowing for decompression. There is chronic thrombus within the superior mesenteric vein, but there is now a channel with re- cannulization. In addition, most of the thrombus in the portal vein has resolved. The patient will be placed on anti coagulation in 3 hr.   Electronically Signed   By: Maryclare Bean M.D.   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  EXAM: IR ULTRASOUND GUIDANCE VASC ACCESS RIGHT; THROMBOECTOMY MECHANICAL VENOUS; IR INFUSION THROMBOL VENOUS INITIAL (MS); PORTAL VENOGRAPHY; ADDITIONAL ARTERIOGRAPHY  FLUOROSCOPY TIME:  8 min and 6 seconds.  MEDICATIONS AND MEDICAL HISTORY: Versed 4 mg, Fentanyl 175 mcg.  As antibiotic prophylaxis, Ancef was ordered pre-procedure and administered intravenously within one hour of incision.  ANESTHESIA/SEDATION: Moderate sedation time: 52 minutes  CONTRAST:  50 cc Omnipaque 300.  Carbon dioxide.  PROCEDURE: The procedure, risks, benefits, and alternatives were explained to the patient. Questions regarding the procedure were encouraged and answered. The patient understands and consents to the procedure.  The right flank was prepped with Betadine in a sterile fashion, and a sterile  drape was applied covering the operative field. A sterile gown and sterile gloves were used for the procedure.  1% lidocaine was utilized for local infiltration. Under sonographic guidance, a 22 gauge Chiba needle was inserted into the peripheral aspect of the main right portal vein. Contrast was injected confirming needle position. The needle was removed over a 018 wire. The Accustick dilator was inserted. A small amount of contrast was injected. Carbon dioxide portal venography was performed.  The Accustick dilator was exchanged over the 3 J for a 5 [place] Kumpe the catheter. This was advanced into the lower portal vein and venography was performed. The catheter was advanced into the upper SMV and contrast was injected. There is partially occlusive thrombus in the main portal vein and occlusive thrombus in the SMV.  The catheter  was carefully advanced over a glidewire to the small venous branches of the distal ileum in the right lower quadrant. Contrast was injected demonstrating stasis of flow within the small branches adjacent to the intestines and complete occlusion within the larger more central branches.  The Kumpe the catheter was removed over a Rosen wire. A 6 French sheath was advanced over the wire to the upper main portal vein. The Angiojet was utilized to mechanically lysed the thrombus for 96 seconds.  Subsequently, a 90 cm 20 cm infusion length 5 French multi side-hole catheter was advanced over the [place] wire to the right lower quadrant. The catheter is positioned along side the thrombus extending from the main portal vein to the ileum. Lysis with 0.5 mg tPA was then instituted. Heparin protocol was resumed per peripheral access.  FINDINGS: Portal venography confirms partial occlusion of the main portal vein and complete occlusion of the SMV. Splenic vein is patent.  Subsequent venogram images confirmed occlusion of mesenteric brain branches to the right lower quadrant. Peripheral branches adjacent  to the intestine are patent with stasis of flow.  The neck series of images demonstrate placement of a 6 French sheath and multi side hole catheter along side the thrombus, extending from the main portal vein to the small mesenteric venous branches in the right lower quadrant.  COMPLICATIONS: None  IMPRESSION: Successful mechanical and pharmacological lysis of thrombus in the main portal vein, superior mesenteric vein, and venous branches in the right lower quadrant. TPA had 0.5 milligrams/hour will be instituted via multi side-hole catheter along side the thrombus.   Electronically Signed   By: Maryclare Bean M.D.   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  EXAM: IR ULTRASOUND GUIDANCE VASC ACCESS RIGHT; THROMBOECTOMY MECHANICAL VENOUS; IR INFUSION THROMBOL VENOUS INITIAL (MS); PORTAL VENOGRAPHY; ADDITIONAL ARTERIOGRAPHY  FLUOROSCOPY TIME:  8 min and 6 seconds.  MEDICATIONS AND MEDICAL HISTORY: Versed 4 mg, Fentanyl 175 mcg.  As antibiotic prophylaxis, Ancef was ordered pre-procedure and administered intravenously within one hour of incision.  ANESTHESIA/SEDATION: Moderate sedation time: 52 minutes  CONTRAST:  50 cc Omnipaque 300.  Carbon dioxide.  PROCEDURE: The procedure, risks, benefits, and alternatives were explained to the patient. Questions regarding the procedure were encouraged and answered. The patient understands and consents to the procedure.  The right flank was prepped with Betadine in a sterile fashion, and a sterile drape was applied covering the operative field. A sterile gown and sterile gloves were used for the procedure.  1% lidocaine was utilized for local infiltration. Under sonographic guidance, a 22 gauge Chiba needle was inserted into the peripheral aspect of the main right portal vein. Contrast was injected confirming needle position. The needle was removed over a 018 wire. The  Accustick dilator was inserted. A small amount of contrast was injected. Carbon dioxide portal venography was performed.  The Accustick dilator was exchanged over the 3 J for a 5 [place] Kumpe the catheter. This was advanced into the lower portal vein and venography was performed. The catheter was advanced into the upper SMV and contrast was injected. There is partially occlusive thrombus in the main portal vein and occlusive thrombus in the SMV.  The catheter was carefully advanced over a glidewire to the small venous branches of the distal ileum in the right lower quadrant. Contrast was injected demonstrating stasis of flow within the small branches  adjacent to the intestines and complete occlusion within the larger more central branches.  The Kumpe the catheter was removed over a [place] wire. A 6 French sheath was advanced over the wire to the upper main portal vein. The Angiojet was utilized to mechanically lysed the thrombus for 96 seconds.  Subsequently, a 90 cm 20 cm infusion length 5 French multi side-hole catheter was advanced over the [place] wire to the right lower quadrant. The catheter is positioned along side the thrombus extending from the main portal vein to the ileum. Lysis with 0.5 mg tPA was then instituted. Heparin protocol was resumed per peripheral access.  FINDINGS: Portal venography confirms partial occlusion of the main portal vein and complete occlusion of the SMV. Splenic vein is patent.  Subsequent venogram images confirmed occlusion of mesenteric brain branches to the right lower quadrant. Peripheral branches adjacent to the intestine are patent with stasis of flow.  The neck series of images demonstrate placement of a 6 French sheath and multi side hole catheter along side the thrombus, extending from the main portal vein to the small mesenteric venous branches in the right lower quadrant.  COMPLICATIONS: None  IMPRESSION: Successful mechanical and pharmacological lysis of thrombus in the  main portal vein, superior mesenteric vein, and venous branches in the right lower quadrant. TPA had 0.5 milligrams/hour will be instituted via multi side-hole catheter along side the thrombus.   Electronically Signed   By: Maryclare Bean M.D.   On: 08/08/2014 17:21   Dg Chest Port 1 View  08/11/2014   CLINICAL DATA:  Hypoxia.  EXAM: PORTABLE CHEST - 1 VIEW  COMPARISON:  August 10, 2014.  FINDINGS: Stable cardiomediastinal silhouette. No pneumothorax or significant pleural effusion is noted. Nasogastric tube tip is seen in expected position of proximal stomach. Left lung is clear. Right lower lobe opacity noted on prior exam is slightly improved, although significant residual density remains concerning for atelectasis or pneumonia. Bony thorax appears intact.  IMPRESSION: Nasogastric tube tip seen in proximal stomach.  Improved right basilar opacity is noted, although significant residual density remains consistent with pneumonia or subsegmental atelectasis. Continued radiographic follow-up is recommended until resolution.   Electronically Signed   By: Sabino Dick M.D.   On: 08/11/2014 08:30   Dg Chest Port 1 View  08/10/2014   CLINICAL DATA:  49 year old male with increasing shortness of breath  EXAM: PORTABLE CHEST - 1 VIEW  COMPARISON:  Prior chest x-ray 04/17/2013  FINDINGS: Low inspiratory volumes. Nonspecific right mid and lower lung airspace opacity. Suspect a small layering effusion. No pulmonary edema. Cardiac and mediastinal contours are within normal limits. No pneumothorax. No acute osseous abnormality.  IMPRESSION: Low inspiratory volumes with new right mid and lower lung opacities. Favored to reflect a small right layering pleural effusion with associated atelectasis. Right lower lobe infiltrate, or mild alveolar hemorrhage are also within the differential.   Electronically Signed   By: Jacqulynn Cadet M.D.   On: 08/10/2014 10:02   Dg Abd 2 Views  08/12/2014   CLINICAL DATA:  Adynamic ileus. Post  catheter directed thrombolysis of the SMV and portal vein.  EXAM: ABDOMEN - 2 VIEW  COMPARISON:  08/11/2014 ; 08/10/2014; CT abdomen pelvis - 08/07/2014; abdominal MRI - 08/05/2014; catheter directed thrombolysis - 08/10/2014  FINDINGS: Enteric contrast is now seen extending throughout the colon to the level of the rectum. There is persistent marked gaseous distension of multiple upstream loops of small bowel with index loop within the left mid  hemiabdomen measuring approximately 6.2 cm in maximal diameter. No definite pneumoperitoneum, pneumatosis or portal venous gas.  Enteric tube tip and side port projects over the gastric fundus.  Limited visualization lower thorax demonstrates a suspected small right-sided pleural effusion with associated right basilar heterogeneous/consolidative opacities.  Embolization coils overlie the peripheral / lateral aspect of the right lobe of the liver. No definite abnormal intra-abdominal calcifications.  Post bilateral hernia repair.  No acute osseus abnormalities.  IMPRESSION: 1. Findings suggestive of persistent though potentially minimally improved ileus with interval transit of ingested contrast, now seen extending to the level of the rectum. Given marked distention of multiple loops of small bowel, continued attention on follow-up is recommended. 2. Suspected small right-sided effusion with associated right basilar opacities, possibly atelectasis.   Electronically Signed   By: Sandi Mariscal M.D.   On: 08/12/2014 08:31   Dg Abd Portable 1v  08/11/2014   CLINICAL DATA:  Small bowel obstruction.  EXAM: PORTABLE ABDOMEN - 1 VIEW  COMPARISON:  August 10, 2014.  FINDINGS: There remains multiple dilated loops of small bowel concerning for distal small bowel obstruction. These are stable compared to prior exam. No significant colonic dilatation is noted. No renal calculi are noted.  IMPRESSION: Stable dilated small bowel loops are noted consistent with distal small bowel obstruction.  Continued radiographic follow-up is recommended.   Electronically Signed   By: Sabino Dick M.D.   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 There remains some contrast within the proximal colon stable from the prior study. Postsurgical changes are noted. An infusion catheter is seen within the portal vein and superior mesenteric vein. No free air is seen.  IMPRESSION: Increasing small bowel dilatation when compared with the prior exam.   Electronically Signed   By: Inez Catalina M.D.   On: 08/10/2014 10:01   Ir Embo Venous Not [place] Guide Roadmapping  08/11/2014   CLINICAL DATA:  Forty-eight hr portal vein and superior mesenteric vein lysis check.  EXAM: IR THROMB F/U EVAL ART/VEN FINAL DAY; THROMBOECTOMY MECHANICAL VENOUS; PTA VENOUS; IR EMBO VENOUS NOT HEMORR HEMANG INC GUIDE ROADMAPPING  FLUOROSCOPY TIME:  Sixteen minutes  MEDICATIONS AND MEDICAL HISTORY: Versed two mg, Fentanyl 100 mcg.  Additional Medications: None.  ANESTHESIA/SEDATION: Moderate sedation time: 60 minutes  CONTRAST:  50 cc Omnipaque 300  PROCEDURE: The procedure, risks, benefits, and alternatives were explained to the patient. Questions regarding the procedure were encouraged and answered. The patient understands and consents to the procedure.  The right flank was prepped with Betadine in a sterile fashion, and a sterile drape was applied covering the operative field. A sterile gown and sterile gloves were used for the procedure.  Initially, an NG tube was placed. The tip was positioned in the body of the stomach. This resulted in significant decompression.  The inner wire of the infusion catheter was removed and contrast was injected for  venography.  The catheter was retracted over a Rosen wire and the Angiojet was utilized through the persistent thrombus for 156 seconds.  Repeat venography through a Kumpe catheter was performed.  The Rosen wire was reinserted and a 5 mm balloon was utilized to dilate the thrombus within  the superior mesenteric and portal veins.  Repeat venography demonstrates improvement.  A pigtail catheter was advanced over the wire to the main portal vein an utilizing twirl technique, additional mechanical thrombectomy was performed. Final venography was performed.  A copy catheter was advanced through the sheath and into the main portal vein. The catheter was retracted into the transhepatic tract. The tract was embolized with Gel-Foam slurry and several the 038 embolization coils. Two 4 mm coils and four 6 mm coils were utilized.  FINDINGS: Initial venography demonstrates improvement overall. The most significant finding was innumerable collateral venous structures now opacifying from the peripheral aspect of the superior mesenteric venous system. 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  After performing the Angiojet, angioplasty, and pigtail technique, there was again noted to be significant improvement after each subsequent intervention. There is some partial thrombus in the most inferior portal vein, but most of the portal venous thrombus had resolved. There is chronic thrombus along the superior mesenteric vein of but there is no a large channel through the thrombus.  COMPLICATIONS: None  IMPRESSION: Successful lysed cysts and thrombectomy of thrombus throughout the portal vein and superior mesenteric vein. At the conclusion of the treatment, there are now multiple collateral venous structures within the periphery of the superior mesenteric venous system that are opacifying an allowing for decompression. There is chronic thrombus within the  superior mesenteric vein, but there is now a channel with re- cannulization. In addition, most of the thrombus in the portal vein has resolved. The patient will be placed on anti coagulation in 3 hr.   Electronically Signed   By: Maryclare Bean M.D.   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  EXAM: IR ULTRASOUND GUIDANCE VASC ACCESS RIGHT; THROMBOECTOMY MECHANICAL VENOUS; IR INFUSION THROMBOL VENOUS INITIAL (MS); PORTAL VENOGRAPHY; ADDITIONAL ARTERIOGRAPHY  FLUOROSCOPY TIME:  8 min and 6 seconds.  MEDICATIONS AND MEDICAL HISTORY: Versed 4 mg, Fentanyl 175 mcg.  As antibiotic prophylaxis, Ancef was ordered pre-procedure and administered intravenously within one hour of incision.  ANESTHESIA/SEDATION: Moderate sedation time: 52 minutes  CONTRAST:  50 cc Omnipaque 300.  Carbon dioxide.  PROCEDURE: The procedure, risks, benefits, and alternatives were explained to the patient. Questions regarding the procedure were encouraged and answered. The patient understands and consents to the procedure.  The right flank was prepped with Betadine in a sterile fashion, and a sterile drape was applied covering the operative field. A sterile gown and sterile gloves were used for the procedure.  1% lidocaine was utilized for local infiltration. Under sonographic guidance, a 22 gauge Chiba needle was inserted into the peripheral aspect of the main right portal vein. Contrast was injected confirming needle position. The needle was removed over a 018 wire. The Accustick dilator was inserted. A small amount of contrast was injected. Carbon dioxide portal venography was performed.  The Accustick dilator was exchanged over the 3 J for a 5 [place] Kumpe the catheter. This was advanced into the lower portal vein and venography was performed. The catheter was advanced into the upper SMV and contrast was injected. There is  partially occlusive thrombus in the main portal vein and occlusive thrombus in the SMV.  The catheter was carefully advanced over a glidewire to the small venous branches of the distal ileum in the right lower  quadrant. Contrast was injected demonstrating stasis of flow within the small branches adjacent to the intestines and complete occlusion within the larger more central branches.  The Kumpe the catheter was removed over a Rosen wire. A 6 French sheath was advanced over the wire to the upper main portal vein. The Angiojet was utilized to mechanically lysed the thrombus for 96 seconds.  Subsequently, a 90 cm 20 cm infusion length 5 French multi side-hole catheter was advanced over the Gordon wire to the right lower quadrant. The catheter is positioned along side the thrombus extending from the main portal vein to the ileum. Lysis with 0.5 mg tPA was then instituted. Heparin protocol was resumed per peripheral access.  FINDINGS: Portal venography confirms partial occlusion of the main portal vein and complete occlusion of the SMV. Splenic vein is patent.  Subsequent venogram images confirmed occlusion of mesenteric brain branches to the right lower quadrant. Peripheral branches adjacent to the intestine are patent with stasis of flow.  The neck series of images demonstrate placement of a 6 French sheath and multi side hole catheter along side the thrombus, extending from the main portal vein to the small mesenteric venous branches in the right lower quadrant.  COMPLICATIONS: None  IMPRESSION: Successful mechanical and pharmacological lysis of thrombus in the main portal vein, superior mesenteric vein, and venous branches in the right lower quadrant. TPA had 0.5 milligrams/hour will be instituted via multi side-hole catheter along side the thrombus.   Electronically Signed   By: Maryclare Bean M.D.   On: 08/08/2014 17:21   Ir Jacolyn Reedy F/u Eval Art/ven Alwyn Ren Day (ms)  08/09/2014   CLINICAL DATA:  49 year old with SMV and  portal vein thrombus and concern for bowel ischemia. Catheter-directed thrombolytic therapy was started on 08/08/2014. Patient returns for follow-up angiography.  EXAM: THROMBOLYTIC FOLLOW-UP ANGIOGRAPHY  Physician: Stephan Minister. Henn, MD  FLUOROSCOPY TIME:  6 min and 18 seconds, 1090 mGy  MEDICATIONS: 2 mg Versed, 100 mcg fentanyl. A radiology nurse monitored the patient for moderate sedation.  ANESTHESIA/SEDATION: Moderate sedation time: 60 min  PROCEDURE: Patient was placed supine. The existing catheters in the right lateral abdomen were prepped and draped in sterile fashion. Maximal barrier sterile technique was utilized including caps, mask, sterile gowns, sterile gloves, sterile drape, hand hygiene and skin antiseptic. Contrast was initially injected through the infusion wire port. Subsequently, the infusion wire was removed and contrast was injected through the infusion catheter. These initial angiographic images were limited. As a result, the infusion catheter was removed for a Kumpe catheter over a Bentson wire. Additional angiography was performed. A new 135 cm infusion catheter with 20 cm of infusion length was advanced over a Rosen wire. Tip was placed in the right lower quadrant and similar placed from the previous study. Catheters were secured to the patient. IV heparin and catheter-directed tPA was again started. TPA was started at 0.5 mg/hour.  FINDINGS: There is hepatopetal flow in the SMV but there continues to be a large amount of thrombus throughout the dilated SMV. There is flow within the main portal vein and the proximal left and right portal veins. Suspect nonocclusive clot in the main portal vein.  Multiple dilated loops of small bowel throughout the abdomen  COMPLICATIONS: None  IMPRESSION: There is flow in the superior mesenteric vein but there continues to be a large amount of irregular thrombus. As a result, a new infusion catheter was placed. Will continue catheter-directed thrombolytic  therapy overnight with IV heparin. Plan to  have the patient return to Interventional Radiology on 08/10/2014 for follow-up angiography and possibly mechanical thrombectomy.  Patient continues to have multiple dilated loops of small bowel throughout the abdomen. The patient's symptoms are improving with catheter-directed thrombolytic therapy but the radiographic appearance of the abdomen is still concerning for a bowel obstruction.   Electronically Signed   By: Markus Daft M.D.   On: 08/09/2014 18:16   Ir Jacolyn Reedy F/u Eval Art/ven Final Day (ms)  08/11/2014   CLINICAL DATA:  Forty-eight hr portal vein and superior mesenteric vein lysis check.  EXAM: IR THROMB F/U EVAL ART/VEN FINAL DAY; THROMBOECTOMY MECHANICAL VENOUS; PTA VENOUS; IR EMBO VENOUS NOT HEMORR HEMANG INC GUIDE ROADMAPPING  FLUOROSCOPY TIME:  Sixteen minutes  MEDICATIONS AND MEDICAL HISTORY: Versed two mg, Fentanyl 100 mcg.  Additional Medications: None.  ANESTHESIA/SEDATION: Moderate sedation time: 60 minutes  CONTRAST:  50 cc Omnipaque 300  PROCEDURE: The procedure, risks, benefits, and alternatives were explained to the patient. Questions regarding the procedure were encouraged and answered. The patient understands and consents to the procedure.  The right flank was prepped with Betadine in a sterile fashion, and a sterile drape was applied covering the operative field. A sterile gown and sterile gloves were used for the procedure.  Initially, an NG tube was placed. The tip was positioned in the body of the stomach. This resulted in significant decompression.  The inner wire of the infusion catheter was removed and contrast was injected for venography.  The catheter was retracted over a Rosen wire and the Angiojet was utilized through the persistent thrombus for 156 seconds.  Repeat venography through a Kumpe catheter was performed.  The Rosen wire was reinserted and a 5 mm balloon was utilized to dilate the thrombus within the superior mesenteric and  portal veins.  Repeat venography demonstrates improvement.  A pigtail catheter was advanced over the wire to the main portal vein an utilizing twirl technique, additional mechanical thrombectomy was performed. Final venography was performed.  A copy catheter was advanced through the sheath and into the main portal vein. The catheter was retracted into the transhepatic tract. The tract was embolized with Gel-Foam slurry and several the 038 embolization coils. Two 4 mm coils and four 6 mm coils were utilized.  FINDINGS: Initial venography demonstrates improvement overall. The most significant finding was innumerable collateral venous structures now opacifying from the peripheral aspect of the superior mesenteric venous system. 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  After performing the Angiojet, angioplasty, and pigtail technique, there was again noted to be significant improvement after each subsequent intervention. There is some partial thrombus in the most inferior portal vein, but most of the portal venous thrombus had resolved. There is chronic thrombus along the superior mesenteric vein of but there is no a large channel through the thrombus.  COMPLICATIONS: None  IMPRESSION: Successful lysed cysts and thrombectomy of thrombus throughout the portal vein and superior mesenteric vein. At the conclusion of the treatment, there are now multiple collateral venous structures within the periphery of the superior mesenteric venous system that are opacifying an allowing for decompression. There is chronic thrombus within the superior mesenteric vein, but there is now a channel with re- cannulization. In addition, most of the thrombus in the portal vein has resolved. The patient will be placed on anti coagulation in 3 hr.   Electronically Signed   By: Maryclare Bean M.D.   On: 08/11/2014  09:53    Labs:  CBC:  Recent Labs  08/11/14 0845  08/11/14 1840 08/12/14 0112 08/12/14 0240  WBC 6.7 7.4 7.1 7.0  HGB 12.9* 12.7* 12.4* 13.3  HCT 40.5 38.7* 37.5* 41.1  PLT 244 251 259 262    COAGS:  Recent Labs  08/05/14 1312  INR 1.21  APTT 28    BMP:  Recent Labs  08/10/14 0500 08/11/14 1246 08/11/14 1840 08/12/14 1025  NA 136 135 136 135  K 4.7 4.1 3.7 4.1  CL 105 102 101 103  CO2 26 27 29 27   GLUCOSE 104* 121* 109* 119*  BUN 18 16 16 15   CALCIUM 7.8* 7.9* 8.1* 7.9*  CREATININE 1.16 1.14 1.33 1.24  GFRNONAA 72* 74* 61* 67*  GFRAA 84* 86* 71* 77*    LIVER FUNCTION TESTS:  Recent Labs  08/04/14 0412 08/08/14 1338 08/09/14 0442 08/10/14 0500  BILITOT 0.9 0.6 1.0 1.0  AST 34 124* 187* 138*  ALT 39 71* 100* 108*  ALKPHOS 69 57 58 66  PROT 7.0 5.2* 4.5* 4.7*  ALBUMIN 3.6 2.3* 2.1* 2.2*    Assessment and Plan: SMV and portal vein thrombus  S/p catheter directed thrombolytic therapy, abdominal pain improved  Non bloody BM this morning, H/H stable on IV heparin now Ileus, NGT intact and clamped and now tolerating liquids per [facility] IR will sign off, please feel free to contact us if needed [phone]    I spent a total of 15 minutes face to face in clinical consultation/evaluation, greater than 50% of which was counseling/coordinating care   Signed: Hedy Jacob 08/12/2014, 11:43 AM

## 2014-08-13 ENCOUNTER — Other Ambulatory Visit: Payer: Self-pay

## 2014-08-13 LAB — CBC
HCT: 35.7 % — ABNORMAL LOW (ref 39.0–52.0)
Hemoglobin: 11.5 g/dL — ABNORMAL LOW (ref 13.0–17.0)
MCH: 28.9 pg (ref 26.0–34.0)
MCHC: 32.2 g/dL (ref 30.0–36.0)
MCV: 89.7 fL (ref 78.0–100.0)
PLATELETS: 260 10*3/uL (ref 150–400)
RBC: 3.98 MIL/uL — AB (ref 4.22–5.81)
RDW: 15.1 % (ref 11.5–15.5)
WBC: 6.1 10*3/uL (ref 4.0–10.5)

## 2014-08-13 LAB — COMPREHENSIVE METABOLIC PANEL
ALK PHOS: 76 U/L (ref 39–117)
ALT: 62 U/L — ABNORMAL HIGH (ref 0–53)
ANION GAP: 4 — AB (ref 5–15)
AST: 57 U/L — AB (ref 0–37)
Albumin: 1.9 g/dL — ABNORMAL LOW (ref 3.5–5.2)
BILIRUBIN TOTAL: 0.7 mg/dL (ref 0.3–1.2)
BUN: 13 mg/dL (ref 6–23)
CALCIUM: 7.7 mg/dL — AB (ref 8.4–10.5)
CO2: 29 mmol/L (ref 19–32)
Chloride: 104 mEq/L (ref 96–112)
Creatinine, Ser: 1.27 mg/dL (ref 0.50–1.35)
GFR calc Af Amer: 75 mL/min — ABNORMAL LOW (ref >90)
GFR calc non Af Amer: 65 mL/min — ABNORMAL LOW (ref >90)
Glucose, Bld: 104 mg/dL — ABNORMAL HIGH (ref 70–99)
Potassium: 3.9 mmol/L (ref 3.5–5.1)
Sodium: 137 mmol/L (ref 135–145)
Total Protein: 4.3 g/dL — ABNORMAL LOW (ref 6.0–8.3)

## 2014-08-13 LAB — HOMOCYSTEINE: HOMOCYSTEINE-NORM: 10.6 umol/L (ref 0.0–15.0)

## 2014-08-13 LAB — MAGNESIUM: MAGNESIUM: 1.7 mg/dL (ref 1.5–2.5)

## 2014-08-13 LAB — HEPARIN LEVEL (UNFRACTIONATED): HEPARIN UNFRACTIONATED: 0.43 [IU]/mL (ref 0.30–0.70)

## 2014-08-13 MED ORDER — FUROSEMIDE 10 MG/ML IJ SOLN
40.0000 mg | Freq: Once | INTRAMUSCULAR | Status: AC
  Administered 2014-08-13: 40 mg via INTRAVENOUS
  Filled 2014-08-13: qty 4

## 2014-08-13 MED ORDER — OXYCODONE HCL 5 MG PO TABS
5.0000 mg | ORAL_TABLET | ORAL | Status: DC | PRN
  Administered 2014-08-13: 10 mg via ORAL
  Filled 2014-08-13: qty 1
  Filled 2014-08-13: qty 2

## 2014-08-13 MED ORDER — FUROSEMIDE 20 MG PO TABS
20.0000 mg | ORAL_TABLET | Freq: Every day | ORAL | Status: DC
  Administered 2014-08-14 – 2014-08-16 (×3): 20 mg via ORAL
  Filled 2014-08-13 (×4): qty 1

## 2014-08-13 MED ORDER — NITROGLYCERIN 0.4 MG SL SUBL
SUBLINGUAL_TABLET | SUBLINGUAL | Status: AC
  Filled 2014-08-13: qty 1

## 2014-08-13 NOTE — Progress Notes (Signed)
Subjective: Tolerated clear liquids well.  Ng out.  Bowels moving.  Objective: Vital signs in last 24 hours: Temp:  [97.9 F (36.6 C)-99.9 F (37.7 C)] 97.9 F (36.6 C) (01/10 0706) Pulse Rate:  [82-96] 82 (01/10 0706) Resp:  [14-23] 14 (01/10 0706) BP: (120-157)/(73-87) 120/73 mmHg (01/10 0706) SpO2:  [92 %-99 %] 95 % (01/10 0706) Last BM Date: 08/12/14  Intake/Output from previous day: 01/09 0701 - 01/10 0700 In: 1177 [P.O.:120; I.V.:1057] Out: 1450 [Urine:850; Emesis/NG output:600] Intake/Output this shift:    PE: General- In NAD Abdomen-soft, nontender  Lab Results:   Recent Labs  08/12/14 0240 08/13/14 0300  WBC 7.0 6.1  HGB 13.3 11.5*  HCT 41.1 35.7*  PLT 262 260   BMET  Recent Labs  08/12/14 1025 08/13/14 0300  NA 135 137  K 4.1 3.9  CL 103 104  CO2 27 29  GLUCOSE 119* 104*  BUN 15 13  CREATININE 1.24 1.27  CALCIUM 7.9* 7.7*   PT/INR No results for input(s): LABPROT, INR in the last 72 hours. Comprehensive Metabolic Panel:    Component Value Date/Time   NA 137 08/13/2014 0300   NA 135 08/12/2014 1025   K 3.9 08/13/2014 0300   K 4.1 08/12/2014 1025   CL 104 08/13/2014 0300   CL 103 08/12/2014 1025   CO2 29 08/13/2014 0300   CO2 27 08/12/2014 1025   BUN 13 08/13/2014 0300   BUN 15 08/12/2014 1025   CREATININE 1.27 08/13/2014 0300   CREATININE 1.24 08/12/2014 1025   GLUCOSE 104* 08/13/2014 0300   GLUCOSE 119* 08/12/2014 1025   CALCIUM 7.7* 08/13/2014 0300   CALCIUM 7.9* 08/12/2014 1025   AST 57* 08/13/2014 0300   AST 138* 08/10/2014 0500   ALT 62* 08/13/2014 0300   ALT 108* 08/10/2014 0500   ALKPHOS 76 08/13/2014 0300   ALKPHOS 66 08/10/2014 0500   BILITOT 0.7 08/13/2014 0300   BILITOT 1.0 08/10/2014 0500   PROT 4.3* 08/13/2014 0300   PROT 4.7* 08/10/2014 0500   ALBUMIN 1.9* 08/13/2014 0300   ALBUMIN 2.2* 08/10/2014 0500     Studies/Results: Dg Chest Port 1 View  08/11/2014   CLINICAL DATA:  Hypoxia.  EXAM: PORTABLE  CHEST - 1 VIEW  COMPARISON:  August 10, 2014.  FINDINGS: Stable cardiomediastinal silhouette. No pneumothorax or significant pleural effusion is noted. Nasogastric tube tip is seen in expected position of proximal stomach. Left lung is clear. Right lower lobe opacity noted on prior exam is slightly improved, although significant residual density remains concerning for atelectasis or pneumonia. Bony thorax appears intact.  IMPRESSION: Nasogastric tube tip seen in proximal stomach.  Improved right basilar opacity is noted, although significant residual density remains consistent with pneumonia or subsegmental atelectasis. Continued radiographic follow-up is recommended until resolution.   Electronically Signed   By: Sabino Dick M.D.   On: 08/11/2014 08:30   Dg Abd 2 Views  08/12/2014   CLINICAL DATA:  Adynamic ileus. Post catheter directed thrombolysis of the SMV and portal vein.  EXAM: ABDOMEN - 2 VIEW  COMPARISON:  08/11/2014 ; 08/10/2014; CT abdomen pelvis - 08/07/2014; abdominal MRI - 08/05/2014; catheter directed thrombolysis - 08/10/2014  FINDINGS: Enteric contrast is now seen extending throughout the colon to the level of the rectum. There is persistent marked gaseous distension of multiple upstream loops of small bowel with index loop within the left mid hemiabdomen measuring approximately 6.2 cm in maximal diameter. No definite pneumoperitoneum, pneumatosis or portal venous gas.  Enteric  tube tip and side port projects over the gastric fundus.  Limited visualization lower thorax demonstrates a suspected small right-sided pleural effusion with associated right basilar heterogeneous/consolidative opacities.  Embolization coils overlie the peripheral / lateral aspect of the right lobe of the liver. No definite abnormal intra-abdominal calcifications.  Post bilateral hernia repair.  No acute osseus abnormalities.  IMPRESSION: 1. Findings suggestive of persistent though potentially minimally improved ileus with  interval transit of ingested contrast, now seen extending to the level of the rectum. Given marked distention of multiple loops of small bowel, continued attention on follow-up is recommended. 2. Suspected small right-sided effusion with associated right basilar opacities, possibly atelectasis.   Electronically Signed   By: Sandi Mariscal M.D.   On: 08/12/2014 08:31   Dg Abd Portable 1v  08/11/2014   CLINICAL DATA:  Small bowel obstruction.  EXAM: PORTABLE ABDOMEN - 1 VIEW  COMPARISON:  August 10, 2014.  FINDINGS: There remains multiple dilated loops of small bowel concerning for distal small bowel obstruction. These are stable compared to prior exam. No significant colonic dilatation is noted. No renal calculi are noted.  IMPRESSION: Stable dilated small bowel loops are noted consistent with distal small bowel obstruction. Continued radiographic follow-up is recommended.   Electronically Signed   By: Sabino Dick M.D.   On: 08/11/2014 08:32    Anti-infectives: Anti-infectives    Start     Dose/Rate Route Frequency Ordered Stop   08/08/14 1418  ceFAZolin (ANCEF) IVPB 2 g/50 mL premix     2 g100 mL/hr over 30 Minutes Intravenous 60 min pre-op 08/08/14 1413 08/08/14 1530   08/04/14 0845  ciprofloxacin (CIPRO) IVPB 400 mg  Status:  Discontinued     400 mg200 mL/hr over 60 Minutes Intravenous Every 12 hours 08/04/14 0834 08/11/14 1053   08/04/14 0845  metroNIDAZOLE (FLAGYL) IVPB 500 mg  Status:  Discontinued     500 mg100 mL/hr over 60 Minutes Intravenous Every 8 hours 08/04/14 0834 08/11/14 1053      Assessment 1. SMV thrombus, s/p transhepatic thrombolysis by IR-tolerating liquids and responding to medical management 2. Ileus-resolved   LOS: 9 days   Plan: Advance diet.  No acute surgical issues.  Please call if we can be of further assistance.   Avrom Robarts J 08/13/2014

## 2014-08-13 NOTE — Progress Notes (Signed)
Called to room at 2315 per floor RN for patient complaining of chest pain in middle of chest 2/10 pain. Pain not radiating at that time. Pt currently on Heparin gtt for portal vein and SMV thrombus s/p TPA and lysis per IR this admission. NTG SL X1 given and EKG done revealing acute MI on tracing and ST elevation in lead III and AVF. RN advised to notify NP on call for [facility] while RRT en route to room. Upon my arrival at 2320 pt found resting in bed pain 1/10. Kathline Magic NP at bedside, reviewed EKG. Old EKG's in epic reviewed. Finding on present EKG appear to be new. Stat troponin ordered and Cardiology consulted  Per NP. Second EKG recommended per Dr.Whitlock completed.  As of 2345 pt pain free. Cardiology to see patient tonight. Troponin pending. Floor RN to monitor closely and notify myself and provider for worsening changes.

## 2014-08-13 NOTE — Progress Notes (Signed)
ANTICOAGULATION CONSULT NOTE - Follow Up Consult  Pharmacy Consult for Heparin  Indication: Mesenteric vein thrombosis  Allergies  Allergen Reactions  . Ace Inhibitors Cough  . Lipitor [Atorvastatin] Other (See Comments)    myalgia  . Codeine Itching    Patient Measurements: Height: 6\' 2"  (188 cm) Weight: (!) 307 lb 12.2 oz (139.6 kg) IBW/kg (Calculated) : 82.2   Vital Signs: Temp: 97.9 F (36.6 C) (01/10 0706) Temp Source: Oral (01/10 0706) BP: 120/73 mmHg (01/10 0706) Pulse Rate: 82 (01/10 0706)  Labs:  Recent Labs  08/11/14 0840  08/11/14 1840 08/12/14 0112 08/12/14 0240 08/12/14 1025 08/13/14 0300  HGB  --   < > 12.7* 12.4* 13.3  --  11.5*  HCT  --   < > 38.7* 37.5* 41.1  --  35.7*  PLT  --   < > 251 259 262  --  260  HEPARINUNFRC 0.34  --   --   --  0.52  --  0.43  CREATININE  --   < > 1.33  --   --  1.24 1.27  < > = values in this interval not displayed.  Estimated Creatinine Clearance: 104.7 mL/min (by C-G formula based on Cr of 1.27).   Assessment: Pt on heparin for SMV thrombus- s/p cath directed lysis with alteplase 1/5-1/7. Heparin level remains therapeutic. Pt previously had bloody BM which is to be expected per Dr. Pascal Lux due to concern for ischemia on recent abd CT, most recently had a non-bloody BM. Hgb down to 11.5 today (13.3 yesterday), PLT WNL, no further bleeding noted.  Goal of Therapy:  Heparin level 0.3-0.7 units/ml Monitor platelets by anticoagulation protocol: Yes   Plan:  -Continue heparin at 2300 units/hr -F/u daily heparin level and CBC -Monitor for further bleeding  Drucie Opitz, PharmD Clinical Pharmacy Resident Pager: [phone] 08/13/2014 10:05 AM

## 2014-08-13 NOTE — Progress Notes (Signed)
[facility] TEAM 1 - Stepdown/ICU TEAM Progress Note  Dylan [name] KGM:[id number] DOB: Sep 28, 1965 DOA: 08/04/2014 PCP: [name], MD  Admit HPI / Brief Narrative: 49 yo M who presented 08/04/14 with 4 days diffuse abd pain, found to have small bowel enteritis along with occlusive thrombus in SMV extending through main portal vein. Underwent lysis by IR on 08/08/14 and returned to ICU on tPA infusion.  Significant Events: 01/01 - admit 01/05 - lysis by IR, placed on tPA infusion 01/06 - PCCM consulted to take over as primary team, repeat eval by IR, large thrombus remaining, tPA to continue for another 24 hours 1/07- Abd port xray: Increasing small bowel dilatation when compared with the prior exam.  HPI/Subjective: Tolerating his regular diet w/o difficulty at present.  C/o swelling in B LE and groin.  No cp or sob.    Assessment/Plan:  SMV and portal vein thrombus  S/p catheter directed thrombolytic therapy d/c'd 1/7 - provoked by weekly testosterone injections in setting of multiple other risk factors - Heme has ok'd transition to lovenox, w/ plan for Pradaxa at d/c - NO MORE hormone replacement - will simply cont heparin for now and plan to transition directly to Pradaxa in AM once CM confirms his insurance does not have a preference of agents in regard to coverage v/s self pay   Ileus - mesenteric ischemia w/ PSBP Per Gen Surg - improving - now on reg diet   Obesity - Body mass index is 39.5 kg/(m^2).   Hx of CVA  CAD s/p PCI 2013 Asymptomatic   Transaminitis  Hepatitis panel negative - likely due to ETOH as well as portal vein thrombosis - resolving steadily   Acute kindey injury Resolved  B LE edema  In the setting of low albumin, ascites, portal vein thrombosis. +9.5L fluid balance - [place] of lower extremities is negative for DVT - diurese - TED hose   Code Status: FULL Family Communication: no family present at time of exam  Disposition Plan: SDU    Consultants: IR PCCM  Gen Surg Hematology   Procedures: CT Abd 01/01 >>> several loops of abnormal enlarged thick walled small bowel loops with surrounding inflammation and fluid in RLQ. US Renal 01/02 >>> prior small bowel wall thickening in RLQ is attributed to likely SMV thrombosis. Small amt of ascites adjacent to the right hepatic lobe. Complex lesion of left mid kidney. MRI 01/02 >>> Occlusive thrombus in SMV extending into main portal vein. Abdominal ascites, left renal cyst. CT abd 01/04 >>> persistent small bowel thickening. Increased small bowel dilatation suspicious for SBO, increase in diffuse mesenteric edema and ascites.  Antibiotics: Cipro 1/1 > 1/8 Flagyl 1/1 > 1/8  DVT prophylaxis: IV heparin   Objective: Blood pressure 153/78, pulse 84, temperature 98.6 F (37 C), temperature source Oral, resp. rate 16, height 6\' 2"  (1.88 m), weight 139.6 kg (307 lb 12.2 oz), SpO2 97 %.  Intake/Output Summary (Last 24 hours) at 08/13/14 1734 Last data filed at 08/13/14 0600  Gross per 24 hour  Intake    549 ml  Output    400 ml  Net    149 ml   Exam: General: No acute respiratory distress Lungs: Clear to auscultation bilaterally without wheezes or crackles Cardiovascular: Regular rate and rhythm without murmur gallop or rub  Abdomen: Nontender, nondistended, soft, bowel sounds positive, no rebound, no ascites, no appreciable mass Extremities: No significant cyanosis, clubbing;  2+edema bilateral lower extremities  Data Reviewed: Basic Metabolic  Panel:  Recent Labs Lab 08/10/14 0500 08/11/14 1246 08/11/14 1840 08/12/14 0240 08/12/14 1025 08/13/14 0300  NA 136 135 136  --  135 137  K 4.7 4.1 3.7  --  4.1 3.9  CL 105 102 101  --  103 104  CO2 26 27 29   --  27 29  GLUCOSE 104* 121* 109*  --  119* 104*  BUN 18 16 16   --  15 13  CREATININE 1.16 1.14 1.33  --  1.24 1.27  CALCIUM 7.8* 7.9* 8.1*  --  7.9* 7.7*  MG 1.7 1.7  --  1.9  --  1.7  PHOS 3.0 3.3  --   --    --   --    Liver Function Tests:  Recent Labs Lab 08/08/14 1338 08/09/14 0442 08/10/14 0500 08/13/14 0300  AST 124* 187* 138* 57*  ALT 71* 100* 108* 62*  ALKPHOS 57 58 66 76  BILITOT 0.6 1.0 1.0 0.7  PROT 5.2* 4.5* 4.7* 4.3*  ALBUMIN 2.3* 2.1* 2.2* 1.9*    CBC:  Recent Labs Lab 08/11/14 0845 08/11/14 1840 08/12/14 0112 08/12/14 0240 08/13/14 0300  WBC 6.7 7.4 7.1 7.0 6.1  HGB 12.9* 12.7* 12.4* 13.3 11.5*  HCT 40.5 38.7* 37.5* 41.1 35.7*  MCV 90.2 89.6 88.9 91.3 89.7  PLT 244 251 259 262 260   CBG:  Recent Labs Lab 08/10/14 0501 08/10/14 0821 08/11/14 0829 08/11/14 1659 08/12/14 0819  GLUCAP 90 120* 100* 103* 113*    Recent Results (from the past 240 hour(s))  MRSA PCR Screening     Status: None   Collection Time: 08/08/14  5:21 PM  Result Value Ref Range Status   MRSA by PCR NEGATIVE NEGATIVE Final    Comment:        The GeneXpert MRSA Assay (FDA approved for NASAL specimens only), is one component of a comprehensive MRSA colonization surveillance program. It is not intended to diagnose MRSA infection nor to guide or monitor treatment for MRSA infections.      Studies:  Recent x-ray studies have been reviewed in detail by the Attending Physician  Scheduled Meds:  Scheduled Meds: . allopurinol  300 mg Oral Daily  . metoprolol succinate  50 mg Oral Daily  . omega-3 acid ethyl esters  1 g Oral Daily  . pantoprazole (PROTONIX) IV  40 mg Intravenous Q12H    Time spent on care of this patient: 35 mins   Amine Adelson T , MD   [facility] Office  [phone] Pager - Text Page per Shea Evans as per below:  On-Call/Text Page:      [email or web address]      password [email or web address]  If 7PM-7AM, please contact night-coverage [email or web address] Password [email or web address] 08/13/2014, 5:34 PM   LOS: 9 days

## 2014-08-14 ENCOUNTER — Encounter (HOSPITAL_COMMUNITY)
Admission: EM | Disposition: A | Discharge status: Home / Self Care | Payer: Self-pay | Source: Home / Self Care | Attending: Internal Medicine

## 2014-08-14 DIAGNOSIS — R079 Chest pain, unspecified: Secondary | ICD-10-CM

## 2014-08-14 DIAGNOSIS — I214 Non-ST elevation (NSTEMI) myocardial infarction: Secondary | ICD-10-CM | POA: Diagnosis not present

## 2014-08-14 LAB — TROPONIN I
TROPONIN I: 2.67 ng/mL — AB (ref <0.031)
TROPONIN I: 3.4 ng/mL — AB (ref <0.031)
Troponin I: 0.05 ng/mL — ABNORMAL HIGH (ref <0.031)
Troponin I: 0.58 ng/mL (ref <0.031)

## 2014-08-14 LAB — CK TOTAL AND CKMB (NOT AT ARMC)
CK, MB: 29.2 ng/mL (ref 0.3–4.0)
Relative Index: 15.1 — ABNORMAL HIGH (ref 0.0–2.5)
Total CK: 193 U/L (ref 7–232)

## 2014-08-14 LAB — ANTI-NUCLEAR AB-TITER (ANA TITER): ANA Titer 1: NEGATIVE

## 2014-08-14 LAB — ANA: Anti Nuclear Antibody(ANA): POSITIVE — AB

## 2014-08-14 LAB — HEPARIN LEVEL (UNFRACTIONATED): Heparin Unfractionated: 0.47 IU/mL (ref 0.30–0.70)

## 2014-08-14 SURGERY — LEFT HEART CATHETERIZATION WITH CORONARY ANGIOGRAM

## 2014-08-14 MED ORDER — ASPIRIN 81 MG PO CHEW
81.0000 mg | CHEWABLE_TABLET | ORAL | Status: AC
  Administered 2014-08-15: 81 mg via ORAL
  Filled 2014-08-14: qty 1

## 2014-08-14 MED ORDER — SODIUM CHLORIDE 0.9 % IV SOLN: 250.0000 mL | INTRAVENOUS | Status: DC | PRN

## 2014-08-14 MED ORDER — HYDROMORPHONE HCL 2 MG PO TABS
1.0000 mg | ORAL_TABLET | Freq: Four times a day (QID) | ORAL | Status: DC | PRN
  Administered 2014-08-14 – 2014-08-16 (×4): 1 mg via ORAL
  Filled 2014-08-14 (×5): qty 1

## 2014-08-14 MED ORDER — SODIUM CHLORIDE 0.9 % IV SOLN
1.0000 mL/kg/h | INTRAVENOUS | Status: DC
  Administered 2014-08-15: 1 mL/kg/h via INTRAVENOUS

## 2014-08-14 MED ORDER — ASPIRIN 325 MG PO TABS
325.0000 mg | ORAL_TABLET | Freq: Once | ORAL | Status: AC
  Administered 2014-08-14: 325 mg via ORAL
  Filled 2014-08-14: qty 1

## 2014-08-14 MED ORDER — SODIUM CHLORIDE 0.9 % IJ SOLN: 3.0000 mL | Freq: Two times a day (BID) | INTRAMUSCULAR | Status: DC

## 2014-08-14 MED ORDER — ASPIRIN EC 81 MG PO TBEC
81.0000 mg | DELAYED_RELEASE_TABLET | Freq: Every day | ORAL | Status: DC
  Administered 2014-08-14 – 2014-08-16 (×2): 81 mg via ORAL
  Filled 2014-08-14 (×3): qty 1

## 2014-08-14 MED ORDER — SODIUM CHLORIDE 0.9 % IJ SOLN: 3.0000 mL | INTRAMUSCULAR | Status: DC | PRN

## 2014-08-14 NOTE — Progress Notes (Signed)
Pt made aware of recommendation for cardiac cath.  Pt agreeable to proceed.  He has loop n rt arm so Groin stick.

## 2014-08-14 NOTE — Progress Notes (Signed)
CRITICAL VALUE ALERT  Critical value received:  Troponin 0.58  Date of notification:  11 Jan 16  Time of notification:  0623  Critical value read back:Yes.    Nurse who received alert:  Corie Chiquito  MD notified (1st page):  Dr Otila Kluver  Time of first page: 0625  MD notified (2nd page):  Time of second page:  Responding MD:  Dr, Otila Kluver  Time MD responded:  [phone]  No new orders given

## 2014-08-14 NOTE — Progress Notes (Signed)
ANTICOAGULATION CONSULT NOTE - Follow Up Consult  Pharmacy Consult for Heparin  Indication: Mesenteric vein thrombosis  Allergies  Allergen Reactions  . Ace Inhibitors Cough  . Lipitor [Atorvastatin] Other (See Comments)    myalgia  . Codeine Itching    Patient Measurements: Height: 6\' 2"  (188 cm) Weight: (!) 307 lb 12.2 oz (139.6 kg) IBW/kg (Calculated) : 82.2   Vital Signs: Temp: 97.6 F (36.4 C) (01/11 0700) Temp Source: Oral (01/11 0700) BP: 130/77 mmHg (01/11 0726) Pulse Rate: 84 (01/11 0726)  Labs:  Recent Labs  08/11/14 1840 08/12/14 0112 08/12/14 0240 08/12/14 1025 08/13/14 0300 08/13/14 2339 08/14/14 0504  HGB 12.7* 12.4* 13.3  --  11.5*  --   --   HCT 38.7* 37.5* 41.1  --  35.7*  --   --   PLT 251 259 262  --  260  --   --   HEPARINUNFRC  --   --  0.52  --  0.43  --  0.47  CREATININE 1.33  --   --  1.24 1.27  --   --   TROPONINI  --   --   --   --   --  0.05* 0.58*    Estimated Creatinine Clearance: 104.7 mL/min (by C-G formula based on Cr of 1.27).   Assessment: Pt on heparin for SMV thrombus- s/p cath directed lysis with alteplase 1/5-1/7. Heparin level remains therapeutic. Pt previously had bloody BM which is to be expected per Dr. Pascal Lux due to concern for ischemia on recent abd CT, most recently had a non-bloody BM.  Episode of CP now resolved but trop inc to 0.58  Goal of Therapy:  Heparin level 0.3-0.7 units/ml Monitor platelets by anticoagulation protocol: Yes   Plan:  -Continue heparin at 2300 units/hr -F/u daily heparin level and CBC -Monitor for further bleeding  Thanks for allowing pharmacy to be a part of this patient's care.  Excell Seltzer, PharmD Clinical Pharmacist, [phone] 08/14/2014 11:09 AM

## 2014-08-14 NOTE — Progress Notes (Signed)
[facility] hospital[facility] progress note. Chief complaint. Chest pain. History of present illness. This 49 year old male in hospital with SMV and portal vein thrombus thought secondary to hormone replacement use. Patient has a known history of coronary artery disease with prior MI and stent placement in 2013. He complained to nursing of central chest pain. A 12-lead EKG was obtained that shows ST elevation in lead 2 and possibly aVF and some ST depression in aVL. Patient was given nitroglycerin sublingual 1 with relief of chest pain. During chest pain the patient denied any radiation of the pain or nausea but did experience some diaphoresis. By the time I arrived the bedside and the patient was chest pain-free. The patient is currently on a heparin drip due to the above-mentioned thrombus. Physical exam. Vital signs. Temperature 98.3, pulse 91, respiration 22, blood pressure 141/81. O2 sats 93%. General appearance. He is an obese middle-aged male who is alert and in no distress. Cardiac. Rate and rhythm regular. No jugular venous distention or edema. Lungs. Breath sounds are clear. Abdomen. Soft with positive bowel sounds. No pain. Impression/plan. Problem #1. Chest pain. I discussed the EKG changes with Dr. Elias Else of cardiology. We did a repeat EKG with the patient chest pain-free and it again showed ST elevation in lead 3 but not again seen in aVF. Still some ST depression in aVL. Troponins have been ordered now and then every 6 hours for a total of 3 sets. Cardiology is currently consented to see the patient in consult. We'll follow for any further recommendations.

## 2014-08-14 NOTE — Progress Notes (Signed)
CRITICAL VALUE ALERT  Critical value received:  CKMB 29.2  Date of notification:  08/14/2014  Time of notification:  2500  Critical value read back:Yes.     Nurse who received alert:  Dorene Grebe  MD notified (1st page):  Dr. Thereasa Solo notified at bedside  Time of first page:  1330  MD notified (2nd page): Cecilie Kicks, NP  Time of second page: 1343  Responding MD:  Cecilie Kicks, NP  Time MD responded:  1344

## 2014-08-14 NOTE — Progress Notes (Signed)
Utilization review completed.  

## 2014-08-14 NOTE — Progress Notes (Signed)
Pt complained to me of chest pain of 3/4  Was also having diaphoresis, EKG obtained, Rapid response called, Primary at the bedside. Pt given 1 nitro with relief of chest pain. 2nd EKG obtained pain Free, primary and cardiology dicussing will continue to monitor.

## 2014-08-14 NOTE — Progress Notes (Signed)
Dylan [name]   DOB:10-Dec-1965   ST#:[id number]   LGX#:[id number]  Patient Care Team: Raelyn Number, MD as PCP - General (Internal Medicine) I have seen the patient, examined him and edited the notes as follows  Subjective: Patient seen and examined. Abdominal pain has improved, with less distention. Denies nausea, vomiting, or any changes in bowel habits. Denies shortness of breath. Had one episode of substernal chest pain on 1/11 with diaphoresis (which he attributed to codeine), with isolated ST elevation myocardial infarction. Cardiology is onboard, placed on baby Aspirin again, and beta blockers. He denies any chest pain this morning. His lower extremity swelling is improving today. Denies any bleeding issues such as epistaxis, hematemesis, hematuria or hematochezia. Plannned for heart cath tomorrow  Scheduled Meds: . allopurinol  300 mg Oral Daily  . aspirin EC  81 mg Oral Daily  . furosemide  20 mg Oral Daily  . metoprolol succinate  50 mg Oral Daily  . omega-3 acid ethyl esters  1 g Oral Daily   Continuous Infusions: . sodium chloride 10 mL/hr at 08/14/14 0600  . heparin 2,300 Units/hr (08/14/14 0900)   PRN Meds:acetaminophen **OR** acetaminophen, HYDROmorphone (DILAUDID) injection, nitroGLYCERIN, ondansetron **OR** ondansetron (ZOFRAN) IV, oxyCODONE, promethazine, zolpidem   Objective:  Filed Vitals:   08/14/14 0726  BP: 130/77  Pulse: 84  Temp:   Resp: 13      Intake/Output Summary (Last 24 hours) at 08/14/14 1007 Last data filed at 08/14/14 0900  Gross per 24 hour  Intake   1139 ml  Output   3425 ml  Net  -2286 ml    GENERAL:alert, no distress and comfortable. He is morbidly obese SKIN: skin color, texture, turgor are normal, no rashes or significant lesions EYES: normal, conjunctiva are pink and non-injected, sclera clear OROPHARYNX: no exudate, no erythema and lips, buccal mucosa, and tongue normal  NECK: supple, thyroid normal size, non-tender, without  nodularity LYMPH: no palpable lymphadenopathy in the cervical, axillary or inguinal LUNGS: decreased sounds at the right base, otherwise clear with normal breathing effort HEART: regular rate & rhythm and no murmurs and  2+ bilateral lower extremity edema, right more than left ABDOMEN: abdomen obese, less distended from prior exam, non tender, and normal bowel sounds.  Musculoskeletal: no cyanosis of digits and no clubbing  PSYCH: alert & oriented x 3 with fluent speech NEURO: no focal motor/sensory deficits    CBG (last 3)   Recent Labs  08/11/14 1659 08/12/14 0819  GLUCAP 103* 113*     Labs:   Recent Labs Lab 08/11/14 0845 08/11/14 1840 08/12/14 0112 08/12/14 0240 08/13/14 0300  WBC 6.7 7.4 7.1 7.0 6.1  HGB 12.9* 12.7* 12.4* 13.3 11.5*  HCT 40.5 38.7* 37.5* 41.1 35.7*  PLT 244 251 259 262 260  MCV 90.2 89.6 88.9 91.3 89.7  MCH 28.7 29.4 29.4 29.6 28.9  MCHC 31.9 32.8 33.1 32.4 32.2  RDW 14.9 14.7 14.7 14.8 15.1     Chemistries:    Recent Labs Lab 08/08/14 1338 08/09/14 0442 08/10/14 0500 08/11/14 1246 08/11/14 1840 08/12/14 0240 08/12/14 1025 08/13/14 0300  NA 134* 137 136 135 136  --  135 137  K 4.1 4.2 4.7 4.1 3.7  --  4.1 3.9  CL 104 107 105 102 101  --  103 104  CO2 23 24 26 27 29   --  27 29  GLUCOSE 106* 91 104* 121* 109*  --  119* 104*  BUN 24* 23 18 16 16   --  15 13  CREATININE 1.42* 1.31 1.16 1.14 1.33  --  1.24 1.27  CALCIUM 7.7* 7.5* 7.8* 7.9* 8.1*  --  7.9* 7.7*  MG  --   --  1.7 1.7  --  1.9  --  1.7  AST 124* 187* 138*  --   --   --   --  57*  ALT 71* 100* 108*  --   --   --   --  62*  ALKPHOS 57 58 66  --   --   --   --  76  BILITOT 0.6 1.0 1.0  --   --   --   --  0.7    GFR Estimated Creatinine Clearance: 104.7 mL/min (by C-G formula based on Cr of 1.27).  Liver Function Tests:  Recent Labs Lab 08/08/14 1338 08/09/14 0442 08/10/14 0500 08/13/14 0300  AST 124* 187* 138* 57*  ALT 71* 100* 108* 62*  ALKPHOS 57 58 66 76   BILITOT 0.6 1.0 1.0 0.7  PROT 5.2* 4.5* 4.7* 4.3*  ALBUMIN 2.3* 2.1* 2.2* 1.9*   Assessment/Plan: 49 y.o.  1. 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 He underwent transhepatic superior mesenteric vein / portal vein (SMV/PV) lysis by interventional radiology on 08/08/14 with good response. He received tPA and heparin, infusion was discontinued on 1/7 by Interventional Radiology  Labs showed Sed Rate of 29  ANA is positive, will evaluate possible autoimmune component. Rest of hypercoagulable panel is pending At this point in time, we reinforced the importance of avoiding further hormone replacement therapy due to recent life threatening event. In the meantime, continue IV heparin. He is also on baby aspirin by Cardiology after the cardiac event on 1/10 He can be transitioned to Lovenox 1mg /kg BID unless a procedure is indicated After much discussions of risks and benefits, the patient elected to proceed with Pradaxa upon discharge.  Mesenteric Ischemia Partial Small Bowel Obstruction This is likely due to superior mesenteric vein thrombosis Improving after tPA infusion CEA taken on admission was 0.6 and CA 19.9 was 15.1, normal values Due to increasing abdominal pain and distention on 1/7 he was placed again on NPO, and NG tube placement, eventually removed.  No surgical intervention is indicated as per Surgery notes  History of CVA This was after a catheterization and stent in 2011 No significant residual noted. Patient is on anticoagulation   ST elevation myocardial infarction (STEMI) He had one episode of substernal chest pain on 1/11 with diaphoresis (which he attributed to codeine), with isolated ST elevation myocardial infarction per EKG.  He was placed on baby Aspirin again as  of 1/11, nitroglycerin and beta blockers.  He denies any chest pain this morning. Troponins are being monitored. Appreciate Cardiology follow up, will need cardiac catheterization  Transamminemia In the setting of alcohol habituation and portal vein thrombosis GI workup ongoing. Hepatitis panel negative  Small Bowel Enteritis Cipro and Flagyl course have been completed  Acute kidney injury Likely due to acute tubular necrosis in the setting of small bowel obstruction Resolved with IV fluids, managed by primary team.  Bilateral lower extremity edema In the setting of low albumin, ascites, portal vein thrombosis Ultrasound of lower extremities is negative for DVT Continue supportive measures.   DVT prophylaxis On mechanical devices, and heparin due to vein thrombosis  Full Code  Other medical issues including cardiac disease, hypercholesterolemia, as per primary team and specialties. Overall his medical condition is stable   **Disclaimer: This note was dictated with voice recognition software. Similar sounding words can inadvertently be transcribed and this note may contain transcription errors which may not have been corrected upon publication of note.Sharene Butters E, PA-C 08/14/2014  10:07 AM Hoa Deriso, MD 08/14/2014

## 2014-08-14 NOTE — Care Management Note (Addendum)
  Page 2 of 2   08/16/2014     12:18:10 PM CARE MANAGEMENT NOTE 08/16/2014  Patient:  Dylan [name], Dylan [name]   Account Number:  [id number]  Date Initiated:  08/08/2014  Documentation initiated by:  Marvetta Gibbons  Subjective/Objective Assessment:   Pt admitted with abd pain, obstruction, SMV thrombus     Action/Plan:   PTA pt lived at home NCM to follow for d/c needs   Anticipated DC Date:  08/16/2014   Anticipated DC Plan:  [place]  CM consult  Medication Assistance      Choice offered to / List presented to:             Status of service:  Completed, signed off Medicare Important Message given?  NO (If response is "NO", the following Medicare IM given date fields will be blank) Date Medicare IM given:   Medicare IM given by:   Date Additional Medicare IM given:   Additional Medicare IM given by:    Discharge Disposition:  HOME/SELF CARE  Per UR Regulation:  Reviewed for med. necessity/level of care/duration of stay  If discussed at [place] of Stay Meetings, dates discussed:   08/15/2014  08/17/2014    Comments:  Mariann Laster RN, BSN, MSHL, CCM  Nurse - Case Manager,  (Unit [phone]  08/16/2014 Consult call with pharmacist/Jan who confirms plan for PRADAXA  Patient has CO-PAY- $ 338.24 Pradaxa savings card provided.    ContactDamare, [place] [phone]  [phone]  08/15/14- [place]- Marvetta Gibbons RN, BSN [phone] Per insurance check pt has high deductibles for medications checked - there are copay cards for medications- that could help to bring cost downArdell Isaacs @ [place] # [phone]  1. ELIQUIS 10 MG  PO BID FOR 7 DAYS   * NOT ON FORMULARY*     ELIQUIS 5 MG PO BID COVER-YES CO-PAY  $ 338-26    30 DAY SUPPLY TIER- 2 DRUG LEVEL PRIOR APPROVAL -NO PHARMACY- [facility]  2. XARELTO 15 MG PO BID COVER- YES CO-PAY- $ 674.87 TIER- 2 DRUG LEVEL PRIOR APPROVAL- NO PHARMACY -  [facility]  XARELTO 20 MG PO QD COVER-YES CO-PAY- $ 337.93 TIER-3 DRUG LEVEL PRIOR APPROVAL -N PHARMACY- [facility]  3. PRADAXA 150 MG PO BID COVER- YES CO-PAY- $ 338.24 TIER- 2 DRUG LEVEL PRIOR APPROVAL - NO PHARMACY - [facility]   ALL ARE BRAND NAME  DRUG PATIENT HAD A HIGH DEDUCTIBLES   08/14/14- 1100- Kristi Webster RN, bSN [phone] Referral for benefits check- insurance check in progress- pt for cath tomorrow

## 2014-08-14 NOTE — Consult Note (Addendum)
CARDIOLOGY CONSULT NOTE  Patient ID: Dylan [name], MRN: [id number], DOB/AGE: Aug 01, 1966 49 y.o. Admit date: 08/04/2014 Date of Consult: 08/14/2014  Primary Physician: [name], MD Primary Cardiologist: Dr. Ellyn Hack  Chief Complaint: chest pain during hospitalization Reason for Consultation: cardiac history, concern for EKG changes  HPI: 49 y.o. male w/ PMHx significant for HTN, hyperlipidemia intolerant of statins, CAD s/pNSTEMI 2011 with PCI to RCA, followup angiosculpt of RCA instent stenosis who initially was admitted 10 days ago with acute onset of abdominal discomfort and was subsequently found to have thrombosed SMV with extension to the portal vein (thought to be due to testosterone replacement). Underwent lysis by IR a few days later and has been recovering since.   Tonight while resting, had acute onset of substernal chest pressure. Similar to his angina from his MI but without the intensity and radiation. +Diaphoretic. Thought initially it was due to hydrocodone which he has had a bad reaction to in the past but then realized the similarity to his angina. EKG done at the time concerning for slight ST elevated in III and TWI in AVL. Relieved with a single nitro after rapid response was called. Due to poor tracing, repeat performed which demonstrated similar isolated ST elevation in lead III in the setting of qlet.  No further pain since then. Sleeping soundly upon my arrival. Initial troponin of 0.05. Occasional PVC on telemetry. Aspirin had been held during hospitalization due to anticoagulation. Already on heparin gtt. On BB. Intolerant of statins and has apparently tried virtually of of them (myalgias).   Past Medical History  Diagnosis Date  . Hypertension   . Coronary artery disease   . S/P angioplasty with stent (angiosculpt) of RCA for "in stent restenosis" 07/06/2012  . Hypercholesteremia   . Anginal pain   . NSTEMI (non-ST elevated myocardial infarction) 07/2010     r/t total occlusion of RCA (3 Taxus Ion DES)  . Stroke 2011    S/P cardiac cath - embolic stroke; denies residual (07/06/2012)  . Migraines     "maybe one/yr" (07/06/2012)  . 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 04/19/2013    No evidence of CVA on MRI/MRA & Dliated Eye Examination. ruptured blood vessel & not occlusion.      Surgical History:  Past Surgical History  Procedure Laterality Date  . Septoplasty  2001  . Coronary angioplasty with stent placement  07/2010    inferior wall MI - 3 Taxus Ion DES (3.0x4mm, 3.0x12mm, 3.5x88mm) to prox and mid RCA  . Coronary angioplasty  07/06/2012    in-stent restenotic lesion in RCA 80%   . Cardiac catheterization  04/18/2013  . Left heart catheterization with coronary angiogram N/A 06/11/2012    Procedure: LEFT HEART CATHETERIZATION WITH CORONARY ANGIOGRAM;  Surgeon: [name], MD;  Location: [facility] CATH LAB;  Service: Cardiovascular;  Laterality: N/A;  . Percutaneous coronary stent intervention (pci-s) N/A 07/06/2012    Procedure: PERCUTANEOUS CORONARY STENT INTERVENTION (PCI-S);  Surgeon: [name], MD;  Location: [facility] CATH LAB;  Service: Cardiovascular;  Laterality: N/A;  . Left heart catheterization with coronary angiogram N/A 04/18/2013    Procedure: LEFT HEART CATHETERIZATION WITH CORONARY ANGIOGRAM;  Surgeon: [name], MD;  Location: [facility] CATH LAB;  Service: Cardiovascular;  Laterality: N/A;     Home Meds: Prior to Admission medications   Medication Sig Start Date End Date Taking? Authorizing Provider  allopurinol (ZYLOPRIM) 300 MG tablet Take 1 tablet by mouth daily. 07/03/14  Yes Historical Provider, MD  aspirin 81 MG chewable tablet Chew 1 tablet (81 mg total) by mouth daily. 07/07/12  Yes Luke K Kilroy, PA-C  azithromycin (ZITHROMAX) 500 MG tablet See admin instructions. 3 tabs on day 1, 2 tabs daily on days 2 and 3 07/25/14  Yes  Historical Provider, MD  cefdinir (OMNICEF) 300 MG capsule Take 1 capsule by mouth 2 (two) times daily. 07/29/14  Yes Historical Provider, MD  furosemide (LASIX) 20 MG tablet Take 20 mg by mouth daily.   Yes Historical Provider, MD  Liraglutide -Weight Management (SAXENDA) 18 MG/3ML SOPN Inject 3 mLs into the skin daily.   Yes Historical Provider, MD  metoprolol succinate (TOPROL-XL) 50 MG 24 hr tablet Take 50 mg by mouth daily. Take with or immediately following a meal.   Yes Historical Provider, MD  olmesartan-hydrochlorothiazide (BENICAR HCT) 40-25 MG per tablet Take 1 tablet by mouth daily.   Yes Historical Provider, MD  omega-3 acid ethyl esters (LOVAZA) 1 G capsule Take 1 capsule (1 g total) by mouth daily. <please make appointment>   Yes Brittainy M Simmons, PA-C  predniSONE (STERAPRED UNI-PAK) 5 MG TABS tablet See admin instructions. 6 tabs on the first day, decreasing by 1 tab daily until gone 07/29/14  Yes Historical Provider, MD  testosterone cypionate (DEPOTESTOTERONE CYPIONATE) 200 MG/ML injection Inject 1 mL into the muscle once a week. Wednesday 06/07/14  Yes Historical Provider, MD  acetaminophen (TYLENOL) 325 MG tablet Take 2 tablets (650 mg total) by mouth every 4 (four) hours as needed. 04/19/13   Erlene Quan, PA-C  fenofibrate 160 MG tablet Take 1 tablet (160 mg total) by mouth daily. Patient not taking: Reported on 08/04/2014 04/18/13   Brittainy M Rosita Fire, PA-C  metoprolol succinate (TOPROL-XL) 25 MG 24 hr tablet Take 1 tablet (25 mg total) by mouth daily. <please make appointment> Patient not taking: Reported on 08/04/2014    Erlene Quan, PA-C  nitroGLYCERIN (NITROSTAT) 0.4 MG SL tablet Place 1 tablet (0.4 mg total) under the tongue every 5 (five) minutes x 3 doses as needed for chest pain. 07/07/12   Erlene Quan, PA-C    Inpatient Medications:  . allopurinol  300 mg Oral Daily  . aspirin EC  81 mg Oral Daily  . furosemide  20 mg Oral Daily  . metoprolol succinate  50 mg  Oral Daily  . omega-3 acid ethyl esters  1 g Oral Daily   . sodium chloride 10 mL/hr at 08/14/14 0100  . heparin 2,300 Units/hr (08/14/14 0100)    Allergies:  Allergies  Allergen Reactions  . Ace Inhibitors Cough  . Lipitor [Atorvastatin] Other (See Comments)    myalgia  . Codeine Itching    History   Social History  . Marital Status: Married    Spouse Name: N/A    Number of Children: 2  . Years of Education: 12   Occupational History  .  [employer]   Social History Main Topics  . Smoking status: Former Smoker -- 1.00 packs/day for 15 years    Types: Cigarettes    Quit date: 06/30/2010  . Smokeless tobacco: Former Systems developer    Types: [place] date: 06/30/2010  . Alcohol Use: 16.8 oz/week    28 Shots of liquor per week     Comment: 04/18/2013 "2 drinks/ day w/maybe 2 shots in each drink"  . Drug Use: No  .  Sexual Activity: Yes   Other Topics Concern  . Not on file   Social History Narrative     Family History  Problem Relation Age of Onset  . Atrial fibrillation Mother   . Mitral valve prolapse Mother   . Coronary artery disease Father     CABG, aortic aneursym,     Review of Systems: General: negative for chills, fever, night sweats or weight changes.  Cardiovascular: see HPI Dermatological: negative for rash Respiratory: negative for cough or wheezing Urologic: negative for hematuria Abdominal: negative for nausea, vomiting, diarrhea, bright red blood per rectum, melena, or hematemesis Neurologic: negative for visual changes, syncope, or dizziness All other systems reviewed and are otherwise negative except as noted above.  Labs:  Recent Labs  08/13/14 2339  TROPONINI 0.05*   Lab Results  Component Value Date   WBC 6.1 08/13/2014   HGB 11.5* 08/13/2014   HCT 35.7* 08/13/2014   MCV 89.7 08/13/2014   PLT 260 08/13/2014    Recent Labs Lab 08/13/14 0300  NA 137  K 3.9  CL 104  CO2 29  BUN 13  CREATININE 1.27  CALCIUM 7.7*  PROT 4.3*   BILITOT 0.7  ALKPHOS 76  ALT 62*  AST 57*  GLUCOSE 104*   Lab Results  Component Value Date   CHOL 201* 04/18/2013   HDL 28* 04/18/2013   LDLCALC UNABLE TO CALCULATE IF TRIGLYCERIDE OVER 400 mg/dL 04/18/2013   TRIG 585* 04/18/2013   Radiology/Studies:   EKG: see HPI  Physical Exam: Blood pressure 148/81, pulse 91, temperature 98.3 F (36.8 C), temperature source Oral, resp. rate 22, height 6\' 2"  (1.88 m), weight 139.6 kg (307 lb 12.2 oz), SpO2 93 %. General: Well developed, well nourished, in no acute distress. Head: Normocephalic, atraumatic, sclera non-icteric, no xanthomas, nares are without discharge.  Neck: Supple. Negative for carotid bruits. JVD not elevated. Lungs: Clear bilaterally to auscultation without wheezes, rales, or rhonchi. Breathing is unlabored. Heart: RRR with S1 S2. No murmurs, rubs, or gallops appreciated. Abdomen: Non-distended with normoactive bowel sounds. No hepatomegaly.. No obvious abdominal masses. Msk:  Strength and tone appear normal for age. Extremities: No clubbing or cyanosis. +2 pitting edema bilaterally.  Distal pedal pulses are 2+ and equal bilaterally. Neuro: Alert and oriented X 3. Moves all extremities spontaneously. Psych:  Responds to questions appropriately with a normal affect.    Problem List 1. Chest pain with isolated STE in lead III 2. Known CAD, s/p multiple PCIs to RCA 3. Occluded SMV and portal vein s/p lysis, on anticoagulation 4. Dyslipidemia, intolerant of statins 5. HTN 6.   Assessment and Plan:  49 y.o. male w/ PMHx significant for HTN, hyperlipidemia intolerant of statins, CAD s/pNSTEMI 2011 with PCI to RCA, followup angiosculpt of RCA instent stenosis 2013 now hospitalized x 10 days due to enteritis secondary to thrombosed SMV and portal vein with hospital course further complicated by acute onset of anginal chest pain.  Encouragingly, he is now asymptomatic and without chest pain. However, the EKG changes, even  though they don't meet criteria for STE myocardial infarction (not in contiguous leads, no reciprocal changes, now without symptoms) they are concerning and in combination with the mildly elevated troponin indicated that he may have had a small ischemic event. Follow up troponins are pending and further rise would indicated more specifically that this is acute coronary syndrome. Possibily excerbated by being off aspirin during hospitalization due to needs for lytics and anticoagulation. Would add back aspirin with  81 mg chewable x 4 now and then 81 mg qday. Continue beta blocker. Ideally, he should be on a statin but reportedly, all trials of various statins caused myalgias.  Noted rather significant hypertriglyceridemia in the past. Currently on fish oil, fibrate being held while in hospital.  Continue to cycle enzymes and repeat EKG in the AM. i have also asked that he be NPO just in case there is a drastic rise in troponins which would push for invasive catheterization and possible stent placement. Otherwise, anticipate this to be medically management for now with heparin, aspirin and beta blocker.  Thank you for this interesting consult. Cardiology will follow up in the AM. Please call with questions.     Signed, Nayla Dias C. MD 08/14/2014, 3:02 AM

## 2014-08-14 NOTE — Progress Notes (Signed)
Subjective: No further chest pain, no SOB.  Objective: Vital signs in last 24 hours: Temp:  [97.6 F (36.4 C)-98.6 F (37 C)] 97.6 F (36.4 C) (01/11 0700) Pulse Rate:  [84-94] 84 (01/11 0726) Resp:  [13-22] 13 (01/11 0726) BP: (120-159)/(71-96) 130/77 mmHg (01/11 0726) SpO2:  [93 %-98 %] 98 % (01/11 0726) Weight change:  Last BM Date: 08/12/14 Intake/Output from previous day: 01/10 0701 - 01/11 0700 In: 1162 [P.O.:360; I.V.:802] Out: 3425 [Urine:3425] Intake/Output this shift: Total I/O In: 66 [I.V.:66] Out: -   PE: General:Pleasant affect, NAD Skin:Warm and dry, brisk capillary refill HEENT:normocephalic, sclera clear, mucus membranes moist Heart:S1S2 RRR without murmur, gallup, rub or click Lungs:clear without rales, rhonchi, or wheezes ZTI:WPYK, non tender, + BS, do not palpate liver spleen or masses Ext:tr to 1+ lower ext edema, 2+ pedal pulses, 2+ radial pulses Neuro:alert and oriented, MAE, follows commands, + facial symmetry Tele:  SR rare PVC  Lab Results:  Recent Labs  08/12/14 0240 08/13/14 0300  WBC 7.0 6.1  HGB 13.3 11.5*  HCT 41.1 35.7*  PLT 262 260   BMET  Recent Labs  08/12/14 1025 08/13/14 0300  NA 135 137  K 4.1 3.9  CL 103 104  CO2 27 29  GLUCOSE 119* 104*  BUN 15 13  CREATININE 1.24 1.27  CALCIUM 7.9* 7.7*    Recent Labs  08/13/14 2339 08/14/14 0504  TROPONINI 0.05* 0.58*    Lab Results  Component Value Date   CHOL 201* 04/18/2013   HDL 28* 04/18/2013   LDLCALC UNABLE TO CALCULATE IF TRIGLYCERIDE OVER 400 mg/dL 04/18/2013   TRIG 585* 04/18/2013   CHOLHDL 7.2 04/18/2013   Lab Results  Component Value Date   HGBA1C 6.3* 04/19/2013     Lab Results  Component Value Date   TSH 0.999 07/01/2010    Hepatic Function Panel  Recent Labs  08/13/14 0300  PROT 4.3*  ALBUMIN 1.9*  AST 57*  ALT 62*  ALKPHOS 76  BILITOT 0.7   No results for input(s): CHOL in the last 72 hours. No results for input(s):  PROTIME in the last 72 hours.     Studies/Results: No results found.  Medications: I have reviewed the patient's current medications. Scheduled Meds: . allopurinol  300 mg Oral Daily  . aspirin EC  81 mg Oral Daily  . furosemide  20 mg Oral Daily  . metoprolol succinate  50 mg Oral Daily  . omega-3 acid ethyl esters  1 g Oral Daily   Continuous Infusions: . sodium chloride 10 mL/hr at 08/14/14 0600  . heparin 2,300 Units/hr (08/14/14 0900)   PRN Meds:.acetaminophen **OR** acetaminophen, HYDROmorphone (DILAUDID) injection, nitroGLYCERIN, ondansetron **OR** ondansetron (ZOFRAN) IV, oxyCODONE, promethazine, zolpidem  Last cath 04/18/13 no intervention:  Expand All Collapse All   ANGIOGRAPHY:  1. Left main: Normal and bifurcated into an LAD and left circumflex system. 2. LAD: Very mild calcification with mild luminal regularities without significant obstruction. The vessel gave rise to 2 diagonal vessels and several septal perforating arteries. 3. Left circumflex: Mild luminal regularities without significant obstruction.  4. Right coronary artery: Long area of tandem stenting extending osculate to the mid segment. In the region of the prior angioscope [place] it was smooth residual narrowing of 20 to less than 30% in the mid RCA after the anterior artery marginal takeoff. No significant stenoses were demonstrated.  5. Left ventriculography revealed normal LV contractility with ejection fraction of 55%.  Was a minimal region of residual mild inferior hypocontractility relative to the other walls.       Assessment/Plan:48 y.o. male w/ PMHx significant for HTN, hyperlipidemia intolerant of statins, CAD s/pNSTEMI 2011 with PCI to RCA, followup angiosculpt of RCA instent stenosis who initially was admitted 10 days ago with acute onset of abdominal discomfort and was subsequently found to have thrombosed SMV with extension to the portal vein (thought to be due to testosterone  replacement). Underwent lysis by IR a few days later and has been recovering since.   Last night while resting around 2300, had acute onset of substernal chest pressure. Similar to his angina from his MI but without the intensity and radiation. +Diaphoretic. Thought initially it was due to hydrocodone which he has had a bad reaction to in the past but then realized the similarity to his angina. EKG done at the time concerning for slight ST elevated in III and TWI in AVL. Relieved with a single nitro after rapid response was called. Due to poor tracing, repeat performed which demonstrated similar isolated ST elevation in lead III.    Troponins mildly + at 0.05 and 0.58 and NOW 2.67.  Pt is on heparin.  Pt intolerant of statins.  Principal Problem:   Superior mesenteric vein thrombosis with lysis, now on heaprin with plan for Pradaxa at discharge. Hypercoag. Panel pending.   Active Problems:   CAD, RCA X 3 ION DES 07/01/2010, ISR -06/23/12 -was off ASA this hospitalization for surgery.  Now with 4 baby ASA and have resumed ASA.  --Pt NPO awaiting MD to decide nuc vs. Cath.     Hx of non-ST elevation myocardial infarction (NSTEMI), 2011   H/O: CVA (cerebrovascular accident), post cardiac cath, minimal speech residual 11/11 post PCI   Acute abdomen   Acute kidney injury   Hyponatremia   Portal vein thrombosis   Acute embolism and thrombosis of vein   Mesenteric vein thrombosis   Venous thrombosis   Abdominal pain   HTN, 151/89 to 120/71 on BB     LOS: 10 days   Time spent with pt. :15 minutes. [facility] R  Nurse Practitioner Certified Pager [phone] or after 5pm and on weekends call [phone] 08/14/2014, 11:02 AM

## 2014-08-14 NOTE — Progress Notes (Signed)
[facility] TEAM 1 - Stepdown/ICU TEAM Progress Note  Dylan [name] SWF:[id number] DOB: Dec 03, 1965 DOA: 08/04/2014 PCP: [name], MD  Admit HPI / Brief Narrative: 49 yo M who presented 08/04/14 with 4 days diffuse abd pain, found to have small bowel enteritis along with occlusive thrombus in SMV extending through main portal vein. Underwent lysis by IR on 08/08/14 and returned to ICU on tPA infusion.  Significant Events: 01/01 - admit 01/05 - lysis by IR, placed on tPA infusion 01/06 - PCCM consulted to take over as primary team, repeat eval by IR, large thrombus remaining, tPA to continue for another 24 hours 1/07- Abd port xray: Increasing small bowel dilatation when compared with the prior exam 1/11 - SSCP lasting 5 mins in early AM - Cards consulted   HPI/Subjective: Pt states he feels fine right now.  He denies current CP, N/V, or abdom pain.    Assessment/Plan:  SMV and portal vein thrombus  S/p catheter directed thrombolytic therapy d/c'd 1/7 - provoked by weekly testosterone injections in setting of multiple other risk factors - Heme has ok'd transition to lovenox, w/ plan for Pradaxa at d/c - NO MORE hormone replacement - will simply cont heparin for now and plan to transition directly to Pradaxa once cardiac issues cleared and once CM confirms his insurance does not have a preference of agents in regard to coverage v/s self pay   SSCP in pt w/ hx of CAD s/p PCI 2013 Troponin is climbing, and CKMB is elevated - pt remains on IV heparin - Cards following and directing w/u, w/ consideration being given to cardiac cath   Ileus - mesenteric ischemia w/ PSBP Per Gen Surg - improved - now on reg diet   Obesity - Body mass index is 39.5 kg/(m^2).   Hx of CVA Occurred during a cardiac cath  Transaminitis  Hepatitis panel negative - likely due to ETOH as well as portal vein thrombosis - resolving steadily - recheck in AM   Acute kindey injury Resolved  B LE edema  In  the setting of low albumin, ascites, portal vein thrombosis. +9.5L fluid balance - [place] of lower extremities is negative for DVT - diurese - TED hose   Code Status: FULL Family Communication: no family present at time of exam  Disposition Plan: SDU   Consultants: IR PCCM  Gen Surg Hematology  Cardiology   Procedures: CT Abd 01/01 >>> several loops of abnormal enlarged thick walled small bowel loops with surrounding inflammation and fluid in RLQ. US Renal 01/02 >>> prior small bowel wall thickening in RLQ is attributed to likely SMV thrombosis. Small amt of ascites adjacent to the right hepatic lobe. Complex lesion of left mid kidney. MRI 01/02 >>> Occlusive thrombus in SMV extending into main portal vein. Abdominal ascites, left renal cyst. CT abd 01/04 >>> persistent small bowel thickening. Increased small bowel dilatation suspicious for SBO, increase in diffuse mesenteric edema and ascites.  Antibiotics: Cipro 1/1 > 1/8 Flagyl 1/1 > 1/8  DVT prophylaxis: IV heparin   Objective: Blood pressure 143/90, pulse 89, temperature 98.6 F (37 C), temperature source Oral, resp. rate 15, height 6\' 2"  (1.88 m), weight 139.6 kg (307 lb 12.2 oz), SpO2 94 %.  Intake/Output Summary (Last 24 hours) at 08/14/14 1341 Last data filed at 08/14/14 1200  Gross per 24 hour  Intake   1129 ml  Output   4650 ml  Net  -3521 ml   Exam: General: No acute respiratory distress  Lungs: Clear to auscultation bilaterally without wheezes or crackles Cardiovascular: Regular rate and rhythm without murmur gallop or rub  Abdomen: Nontender, nondistended, soft, bowel sounds positive, no rebound, no ascites, no appreciable mass Extremities: No significant cyanosis, clubbing;  2+edema bilateral lower extremities persists   Data Reviewed: Basic Metabolic Panel:  Recent Labs Lab 08/10/14 0500 08/11/14 1246 08/11/14 1840 08/12/14 0240 08/12/14 1025 08/13/14 0300  NA 136 135 136  --  135 137  K 4.7 4.1  3.7  --  4.1 3.9  CL 105 102 101  --  103 104  CO2 26 27 29   --  27 29  GLUCOSE 104* 121* 109*  --  119* 104*  BUN 18 16 16   --  15 13  CREATININE 1.16 1.14 1.33  --  1.24 1.27  CALCIUM 7.8* 7.9* 8.1*  --  7.9* 7.7*  MG 1.7 1.7  --  1.9  --  1.7  PHOS 3.0 3.3  --   --   --   --    Liver Function Tests:  Recent Labs Lab 08/08/14 1338 08/09/14 0442 08/10/14 0500 08/13/14 0300  AST 124* 187* 138* 57*  ALT 71* 100* 108* 62*  ALKPHOS 57 58 66 76  BILITOT 0.6 1.0 1.0 0.7  PROT 5.2* 4.5* 4.7* 4.3*  ALBUMIN 2.3* 2.1* 2.2* 1.9*    CBC:  Recent Labs Lab 08/11/14 0845 08/11/14 1840 08/12/14 0112 08/12/14 0240 08/13/14 0300  WBC 6.7 7.4 7.1 7.0 6.1  HGB 12.9* 12.7* 12.4* 13.3 11.5*  HCT 40.5 38.7* 37.5* 41.1 35.7*  MCV 90.2 89.6 88.9 91.3 89.7  PLT 244 251 259 262 260   CBG:  Recent Labs Lab 08/10/14 0501 08/10/14 0821 08/11/14 0829 08/11/14 1659 08/12/14 0819  GLUCAP 90 120* 100* 103* 113*    Recent Results (from the past 240 hour(s))  MRSA PCR Screening     Status: None   Collection Time: 08/08/14  5:21 PM  Result Value Ref Range Status   MRSA by PCR NEGATIVE NEGATIVE Final    Comment:        The GeneXpert MRSA Assay (FDA approved for NASAL specimens only), is one component of a comprehensive MRSA colonization surveillance program. It is not intended to diagnose MRSA infection nor to guide or monitor treatment for MRSA infections.      Studies:  Recent x-ray studies have been reviewed in detail by the Attending Physician  Scheduled Meds:  Scheduled Meds: . allopurinol  300 mg Oral Daily  . aspirin EC  81 mg Oral Daily  . furosemide  20 mg Oral Daily  . metoprolol succinate  50 mg Oral Daily  . omega-3 acid ethyl esters  1 g Oral Daily    Time spent on care of this patient: 35 mins   MCCLUNG,JEFFREY T , MD   [facility] Office  [phone] Pager - Text Page per Shea Evans as per below:  On-Call/Text Page:      [email or web address]       password [email or web address]  If 7PM-7AM, please contact night-coverage [email or web address] Password [email or web address] 08/14/2014, 1:41 PM   LOS: 10 days

## 2014-08-15 ENCOUNTER — Encounter (HOSPITAL_COMMUNITY): Payer: Self-pay | Admitting: Cardiology

## 2014-08-15 ENCOUNTER — Encounter (HOSPITAL_COMMUNITY)
Admission: EM | Disposition: A | Discharge status: Home / Self Care | Payer: Managed Care, Other (non HMO) | Source: Home / Self Care | Attending: Internal Medicine

## 2014-08-15 DIAGNOSIS — T82867A Thrombosis of cardiac prosthetic devices, implants and grafts, initial encounter: Secondary | ICD-10-CM

## 2014-08-15 DIAGNOSIS — I214 Non-ST elevation (NSTEMI) myocardial infarction: Secondary | ICD-10-CM

## 2014-08-15 DIAGNOSIS — I379 Nonrheumatic pulmonary valve disorder, unspecified: Secondary | ICD-10-CM

## 2014-08-15 DIAGNOSIS — Z9861 Coronary angioplasty status: Secondary | ICD-10-CM

## 2014-08-15 HISTORY — PX: TRANSTHORACIC ECHOCARDIOGRAM: SHX275

## 2014-08-15 HISTORY — DX: Thrombosis due to cardiac prosthetic devices, implants and grafts, initial encounter: T82.867A

## 2014-08-15 HISTORY — PX: LEFT HEART CATHETERIZATION WITH CORONARY ANGIOGRAM: SHX5451

## 2014-08-15 LAB — HEPARIN LEVEL (UNFRACTIONATED): Heparin Unfractionated: 0.38 IU/mL (ref 0.30–0.70)

## 2014-08-15 LAB — COMPREHENSIVE METABOLIC PANEL
ALT: 51 U/L (ref 0–53)
ANION GAP: 3 — AB (ref 5–15)
AST: 57 U/L — ABNORMAL HIGH (ref 0–37)
Albumin: 2.2 g/dL — ABNORMAL LOW (ref 3.5–5.2)
Alkaline Phosphatase: 81 U/L (ref 39–117)
BUN: 9 mg/dL (ref 6–23)
CALCIUM: 7.8 mg/dL — AB (ref 8.4–10.5)
CO2: 33 mmol/L — ABNORMAL HIGH (ref 19–32)
Chloride: 101 mEq/L (ref 96–112)
Creatinine, Ser: 1.13 mg/dL (ref 0.50–1.35)
GFR, EST AFRICAN AMERICAN: 87 mL/min — AB (ref >90)
GFR, EST NON AFRICAN AMERICAN: 75 mL/min — AB (ref >90)
GLUCOSE: 116 mg/dL — AB (ref 70–99)
POTASSIUM: 3.7 mmol/L (ref 3.5–5.1)
Sodium: 137 mmol/L (ref 135–145)
TOTAL PROTEIN: 5 g/dL — AB (ref 6.0–8.3)
Total Bilirubin: 0.5 mg/dL (ref 0.3–1.2)

## 2014-08-15 LAB — CBC
HCT: 40.6 % (ref 39.0–52.0)
Hemoglobin: 13 g/dL (ref 13.0–17.0)
MCH: 29.6 pg (ref 26.0–34.0)
MCHC: 32 g/dL (ref 30.0–36.0)
MCV: 92.5 fL (ref 78.0–100.0)
Platelets: 299 10*3/uL (ref 150–400)
RBC: 4.39 MIL/uL (ref 4.22–5.81)
RDW: 15.1 % (ref 11.5–15.5)
WBC: 6.2 10*3/uL (ref 4.0–10.5)

## 2014-08-15 LAB — PROTIME-INR
INR: 1.17 (ref 0.00–1.49)
Prothrombin Time: 15 seconds (ref 11.6–15.2)

## 2014-08-15 LAB — POCT ACTIVATED CLOTTING TIME: ACTIVATED CLOTTING TIME: 472 s

## 2014-08-15 SURGERY — LEFT HEART CATHETERIZATION WITH CORONARY ANGIOGRAM

## 2014-08-15 MED ORDER — MIDAZOLAM HCL 2 MG/2ML IJ SOLN
INTRAMUSCULAR | Status: AC
  Filled 2014-08-15: qty 2

## 2014-08-15 MED ORDER — SODIUM CHLORIDE 0.9 % IV SOLN
1.7500 mg/kg/h | INTRAVENOUS | Status: AC
  Administered 2014-08-15: 1.75 mg/kg/h via INTRAVENOUS
  Filled 2014-08-15: qty 250

## 2014-08-15 MED ORDER — CLOPIDOGREL BISULFATE 75 MG PO TABS
75.0000 mg | ORAL_TABLET | Freq: Every day | ORAL | Status: DC
  Administered 2014-08-16: 75 mg via ORAL
  Filled 2014-08-15: qty 1

## 2014-08-15 MED ORDER — TIROFIBAN HCL IV 12.5 MG/250 ML
INTRAVENOUS | Status: AC
  Filled 2014-08-15: qty 250

## 2014-08-15 MED ORDER — CLOPIDOGREL BISULFATE 300 MG PO TABS
ORAL_TABLET | ORAL | Status: AC
  Filled 2014-08-15: qty 1

## 2014-08-15 MED ORDER — HEPARIN (PORCINE) IN NACL 2-0.9 UNIT/ML-% IJ SOLN
INTRAMUSCULAR | Status: AC
Start: 2014-08-15 — End: 2014-08-15
  Filled 2014-08-15: qty 1000

## 2014-08-15 MED ORDER — TRAMADOL HCL 50 MG PO TABS: 50.0000 mg | ORAL_TABLET | Freq: Four times a day (QID) | ORAL | Status: DC | PRN

## 2014-08-15 MED ORDER — DABIGATRAN ETEXILATE MESYLATE 150 MG PO CAPS
150.0000 mg | ORAL_CAPSULE | Freq: Two times a day (BID) | ORAL | Status: DC
  Administered 2014-08-16: 09:00:00 150 mg via ORAL
  Filled 2014-08-15 (×2): qty 1

## 2014-08-15 MED ORDER — LIDOCAINE HCL (PF) 1 % IJ SOLN
INTRAMUSCULAR | Status: AC
  Filled 2014-08-15: qty 30

## 2014-08-15 MED ORDER — FENTANYL CITRATE 0.05 MG/ML IJ SOLN
INTRAMUSCULAR | Status: AC
  Filled 2014-08-15: qty 2

## 2014-08-15 MED ORDER — SODIUM CHLORIDE 0.9 % IV SOLN
1.0000 mL/kg/h | INTRAVENOUS | Status: AC
  Administered 2014-08-15: 1 mL/kg/h via INTRAVENOUS

## 2014-08-15 MED ORDER — SODIUM CHLORIDE 0.9 % IV SOLN
1.7500 mg/kg/h | INTRAVENOUS | Status: AC
  Filled 2014-08-15: qty 250

## 2014-08-15 MED ORDER — HEPARIN (PORCINE) IN NACL 100-0.45 UNIT/ML-% IJ SOLN
2300.0000 [IU]/h | INTRAMUSCULAR | Status: AC
  Administered 2014-08-15: 23:00:00 2300 [IU]/h via INTRAVENOUS
  Filled 2014-08-15 (×2): qty 250

## 2014-08-15 MED ORDER — HEPARIN (PORCINE) IN NACL 2-0.9 UNIT/ML-% IJ SOLN
INTRAMUSCULAR | Status: AC
  Filled 2014-08-15: qty 500

## 2014-08-15 MED ORDER — NITROGLYCERIN 1 MG/10 ML FOR IR/CATH LAB
INTRA_ARTERIAL | Status: AC
  Filled 2014-08-15: qty 10

## 2014-08-15 MED ORDER — BIVALIRUDIN 250 MG IV SOLR
INTRAVENOUS | Status: AC
  Filled 2014-08-15: qty 250

## 2014-08-15 NOTE — Progress Notes (Signed)
Dylan [name]   DOB:06-Sep-1965   GN#:[id number]   HQI#:[id number]  Patient Care Team: Raelyn Number, MD as PCP - General (Internal Medicine) I have seen the patient, examined him and edited the notes as follows  Subjective: Patient seen and examined. Abdominal pain has improved, with less distention. Denies nausea, vomiting, or any changes in bowel habits. Denies shortness of breath. Chest pain resolved. His lower extremity swelling is improving today. Denies any bleeding issues such as epistaxis, hematemesis, hematuria or hematochezia. He had heart cath and PCI today.  Scheduled Meds: . allopurinol  300 mg Oral Daily  . aspirin EC  81 mg Oral Daily  . furosemide  20 mg Oral Daily  . metoprolol succinate  50 mg Oral Daily  . omega-3 acid ethyl esters  1 g Oral Daily  . sodium chloride  3 mL Intravenous Q12H   Continuous Infusions: . sodium chloride Stopped (08/15/14 0400)  . sodium chloride 1 mL/kg/hr (08/15/14 0900)  . heparin 2,300 Units/hr (08/15/14 0900)   PRN Meds:sodium chloride, acetaminophen **OR** acetaminophen, HYDROmorphone (DILAUDID) injection, HYDROmorphone, nitroGLYCERIN, ondansetron **OR** ondansetron (ZOFRAN) IV, promethazine, sodium chloride, zolpidem   Objective:  Filed Vitals:   08/15/14 0722  BP: 126/78  Pulse: 90  Temp:   Resp: 24      Intake/Output Summary (Last 24 hours) at 08/15/14 1002 Last data filed at 08/15/14 0900  Gross per 24 hour  Intake 1437.8 ml  Output   2925 ml  Net -1487.2 ml    GENERAL:alert, no distress and comfortable. He is morbidly obese SKIN: skin color, texture, turgor are normal, no rashes or significant lesions EYES: normal, conjunctiva are pink and non-injected, sclera clear OROPHARYNX: no exudate, no erythema and lips, buccal mucosa, and tongue normal  NECK: supple, thyroid normal size, non-tender, without nodularity LYMPH: no palpable lymphadenopathy in the cervical, axillary or inguinal LUNGS: decreased sounds at the  right base, otherwise clear with normal breathing effort HEART: regular rate & rhythm and no murmurs and  2+ bilateral lower extremity edema, right more than left ABDOMEN: abdomen obese, less distended from prior exam, non tender, and normal bowel sounds.  Musculoskeletal: no cyanosis of digits and no clubbing  PSYCH: alert & oriented x 3 with fluent speech NEURO: no focal motor/sensory deficits    CBG (last 3)  No results for input(s): GLUCAP in the last 72 hours.   Labs:   Recent Labs Lab 08/11/14 1840 08/12/14 0112 08/12/14 0240 08/13/14 0300 08/15/14 0323  WBC 7.4 7.1 7.0 6.1 6.2  HGB 12.7* 12.4* 13.3 11.5* 13.0  HCT 38.7* 37.5* 41.1 35.7* 40.6  PLT 251 259 262 260 299  MCV 89.6 88.9 91.3 89.7 92.5  MCH 29.4 29.4 29.6 28.9 29.6  MCHC 32.8 33.1 32.4 32.2 32.0  RDW 14.7 14.7 14.8 15.1 15.1     Chemistries:    Recent Labs Lab 08/08/14 1338 08/09/14 0442 08/10/14 0500 08/11/14 1246 08/11/14 1840 08/12/14 0240 08/12/14 1025 08/13/14 0300 08/15/14 0323  NA 134* 137 136 135 136  --  135 137 137  K 4.1 4.2 4.7 4.1 3.7  --  4.1 3.9 3.7  CL 104 107 105 102 101  --  103 104 101  CO2 23 24 26 27 29   --  27 29 33*  GLUCOSE 106* 91 104* 121* 109*  --  119* 104* 116*  BUN 24* 23 18 16 16   --  15 13 9   CREATININE 1.42* 1.31 1.16 1.14 1.33  --  1.24  1.27 1.13  CALCIUM 7.7* 7.5* 7.8* 7.9* 8.1*  --  7.9* 7.7* 7.8*  MG  --   --  1.7 1.7  --  1.9  --  1.7  --   AST 124* 187* 138*  --   --   --   --  57* 57*  ALT 71* 100* 108*  --   --   --   --  62* 51  ALKPHOS 57 58 66  --   --   --   --  76 81  BILITOT 0.6 1.0 1.0  --   --   --   --  0.7 0.5    GFR Estimated Creatinine Clearance: 117 mL/min (by C-G formula based on Cr of 1.13).  Liver Function Tests:  Recent Labs Lab 08/08/14 1338 08/09/14 0442 08/10/14 0500 08/13/14 0300 08/15/14 0323  AST 124* 187* 138* 57* 57*  ALT 71* 100* 108* 62* 51  ALKPHOS 57 58 66 76 81  BILITOT 0.6 1.0 1.0 0.7 0.5  PROT 5.2*  4.5* 4.7* 4.3* 5.0*  ALBUMIN 2.3* 2.1* 2.2* 1.9* 2.2*   Assessment/Plan: 49 y.o.  1. 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 He underwent transhepatic superior mesenteric vein / portal vein (SMV/PV) lysis by interventional radiology on 08/08/14 with good response. He received tPA and heparin, infusion was discontinued on 1/7 by Interventional Radiology  Labs showed Sed Rate of 29  ANA is positive, will evaluate possible autoimmune component. Rest of hypercoagulable panel is pending At this point in time, we reinforced the importance of avoiding further hormone replacement therapy due to recent life threatening event. He is also on baby aspirin by Cardiology after the cardiac event on 1/10 He can be transitioned to start Pradaxa tomorrow. After much discussions of risks and benefits, the patient elected to proceed with Pradaxa upon discharge as long as Insurance approves it. Appreciate Case managers involvement.  Mesenteric Ischemia Partial Small Bowel Obstruction This is likely due to superior mesenteric vein thrombosis Improving after tPA infusion CEA taken on admission was 0.6 and CA 19.9 was 15.1, normal values Due to increasing abdominal pain and distention on 1/7 he was placed again on NPO, and NG tube placement, eventually removed.  No surgical intervention is indicated as per Surgery notes  History of CVA This was after a catheterization and stent in 2011 No significant residual noted. Patient is on anticoagulation  ST elevation myocardial infarction (STEMI) He had one episode of substernal chest pain on 1/11 with diaphoresis (which he attributed to codeine), with isolated ST elevation myocardial infarction per EKG.  He was placed on baby Aspirin again as of 1/11, nitroglycerin  and beta blockers.  He denies any chest pain this morning. Troponins are being monitored. He had recent PCI. Will be on dual antiplatelet agents upon discharge  Transamminemia In the setting of alcohol habituation and portal vein thrombosis GI workup ongoing. Hepatitis panel negative Improved  Small Bowel Enteritis Cipro and Flagyl course have been completed  Acute kidney injury Likely due to acute tubular necrosis in the setting of small bowel obstruction Resolved with IV fluids, managed by primary team.  Bilateral lower extremity edema In the setting of low albumin, ascites, portal vein thrombosis Ultrasound of lower extremities is negative for DVT Continue supportive measures including diuretics and mechanical devices.   DVT prophylaxis On  mechanical devices, and heparin due to vein thrombosis  Full Code  Other medical issues including cardiac disease, hypercholesterolemia, as per primary team and specialties. Overall his medical condition is stable  Discharge planning The patient is aware he will be at high risk of bleeding due to concurrent antiplatelet agents and anticoagulation therapy. I have tentatively made appointment to see him on 1/28 at 2 pm. His wife is aware of appointment. I will sign off  **Disclaimer: This note was dictated with voice recognition software. Similar sounding words can inadvertently be transcribed and this note may contain transcription errors which may not have been corrected upon publication of note.Sharene Butters E, PA-C 08/15/2014  10:02 AM Rizwan Kuyper, MD 08/15/2014

## 2014-08-15 NOTE — Progress Notes (Addendum)
[facility] TEAM 1 - Stepdown/ICU TEAM Progress Note  Dylan [name] DTO:[id number] DOB: [birth year] DOA: 08/04/2014 PCP: [name], MD  Admit HPI / Brief Narrative: 49 yo M who presented 08/04/14 with 4 days diffuse abd pain, found to have small bowel enteritis along with occlusive thrombus in SMV extending through main portal vein. Underwent lysis by IR on 08/08/14 and returned to ICU on tPA infusion.  Significant Events: 01/01 - admit 01/05 - lysis by IR, placed on tPA infusion 01/06 - PCCM consulted to take over as primary team, repeat eval by IR, large thrombus remaining, tPA to continue for another 24 hours 1/07- Abd port xray: Increasing small bowel dilatation when compared with the prior exam 1/11 - SSCP lasting 5 mins in early AM - Cards consulted   HPI/Subjective: Pt states he feels fine, no CP, no SOB and w/o acute complaints.     Assessment/Plan:  SMV and portal vein thrombus  S/p catheter directed thrombolytic therapy d/c'd 1/7 - provoked by weekly testosterone injections in setting of multiple other risk factors - Heme has ok'd transition to lovenox, w/ plan for Pradaxa at d/c - NO MORE hormone replacement - will simply cont heparin for now and plan to transition directly to Pradaxa once cardiac issues cleared and once CM confirms his insurance does not have a preference of agents in regard to coverage v/s self pay   SSCP in pt w/ hx of CAD s/p PCI 2013 and changes in EKG  -Troponin elevated and also elevated CKMB  - pt remains on IV heparin - Cards following and directing w/u, w/ consideration being given to cardiac cath today 08/15/14 at 1:00 pm  Ileus - mesenteric ischemia w/ PSBP -Per Gen Surg - improved - now on reg diet  -no nausea, no vomiting -moving bowels w/o problems.  Obesity - Body mass index is 39.1 kg/(m^2).  -weight loss and low calorie diet encouraged  Hx of CVA -Occurred during a cardiac cath in the past -currently on heparin -no new  deficit  Transaminitis  -Hepatitis panel negative  -likely due to ETOH as well as portal vein thrombosis - resolving steadily  -will monitor  Acute kindey injury -Resolved with IVF's -will monitor  B LE edema  -In the setting of low albumin, ascites, portal vein thrombosis. +9.5L fluid balance - [place] of lower extremities is negative for DVT  - will continue lasix for diureses  -TED hose in place  Code Status: FULL Family Communication: Wife at bedside Disposition Plan: Remains in SDU   Consultants: IR PCCM  Gen Surg Hematology  Cardiology   Procedures: CT Abd 01/01 >>> several loops of abnormal enlarged thick walled small bowel loops with surrounding inflammation and fluid in RLQ. US Renal 01/02 >>> prior small bowel wall thickening in RLQ is attributed to likely SMV thrombosis. Small amt of ascites adjacent to the right hepatic lobe. Complex lesion of left mid kidney. MRI 01/02 >>> Occlusive thrombus in SMV extending into main portal vein. Abdominal ascites, left renal cyst. CT abd 01/04 >>> persistent small bowel thickening. Increased small bowel dilatation suspicious for SBO, increase in diffuse mesenteric edema and ascites. Cardiac Cath Planned for 08/15/14>>   Antibiotics: Cipro 1/1 > 1/8 Flagyl 1/1 > 1/8  DVT prophylaxis: IV heparin   Objective: Blood pressure 131/84, pulse 90, temperature 98.1 F (36.7 C), temperature source Oral, resp. rate 18, height 6\' 2"  (1.88 m), weight 138.2 kg (304 lb 10.8 oz), SpO2 96 %.  Intake/Output Summary (  Last 24 hours) at 08/15/14 1816 Last data filed at 08/15/14 1549  Gross per 24 hour  Intake 1824.2 ml  Output   3650 ml  Net -1825.8 ml   Exam: General: No acute respiratory distress; denies CP. No acute complaints Lungs: Clear to auscultation bilaterally without wheezes or crackles Cardiovascular: Regular rate and rhythm without murmur gallop or rub  Abdomen: Nontender, nondistended, soft, bowel sounds positive, no  rebound, no ascites, no appreciable mass Extremities: No significant cyanosis, clubbing;  2+edema bilateral lower extremities persists; patient with TED hoses in place  Data Reviewed: Basic Metabolic Panel:  Recent Labs Lab 08/10/14 0500 08/11/14 1246 08/11/14 1840 08/12/14 0240 08/12/14 1025 08/13/14 0300 08/15/14 0323  NA 136 135 136  --  135 137 137  K 4.7 4.1 3.7  --  4.1 3.9 3.7  CL 105 102 101  --  103 104 101  CO2 26 27 29   --  27 29 33*  GLUCOSE 104* 121* 109*  --  119* 104* 116*  BUN 18 16 16   --  15 13 9   CREATININE 1.16 1.14 1.33  --  1.24 1.27 1.13  CALCIUM 7.8* 7.9* 8.1*  --  7.9* 7.7* 7.8*  MG 1.7 1.7  --  1.9  --  1.7  --   PHOS 3.0 3.3  --   --   --   --   --    Liver Function Tests:  Recent Labs Lab 08/09/14 0442 08/10/14 0500 08/13/14 0300 08/15/14 0323  AST 187* 138* 57* 57*  ALT 100* 108* 62* 51  ALKPHOS 58 66 76 81  BILITOT 1.0 1.0 0.7 0.5  PROT 4.5* 4.7* 4.3* 5.0*  ALBUMIN 2.1* 2.2* 1.9* 2.2*    CBC:  Recent Labs Lab 08/11/14 1840 08/12/14 0112 08/12/14 0240 08/13/14 0300 08/15/14 0323  WBC 7.4 7.1 7.0 6.1 6.2  HGB 12.7* 12.4* 13.3 11.5* 13.0  HCT 38.7* 37.5* 41.1 35.7* 40.6  MCV 89.6 88.9 91.3 89.7 92.5  PLT 251 259 262 260 299   CBG:  Recent Labs Lab 08/10/14 0501 08/10/14 0821 08/11/14 0829 08/11/14 1659 08/12/14 0819  GLUCAP 90 120* 100* 103* 113*    Recent Results (from the past 240 hour(s))  MRSA PCR Screening     Status: None   Collection Time: 08/08/14  5:21 PM  Result Value Ref Range Status   MRSA by PCR NEGATIVE NEGATIVE Final    Comment:        The GeneXpert MRSA Assay (FDA approved for NASAL specimens only), is one component of a comprehensive MRSA colonization surveillance program. It is not intended to diagnose MRSA infection nor to guide or monitor treatment for MRSA infections.      Scheduled Meds:  Scheduled Meds: . allopurinol  300 mg Oral Daily  . aspirin EC  81 mg Oral Daily  . [START  ON 08/16/2014] clopidogrel  75 mg Oral Q breakfast  . [START ON 08/16/2014] dabigatran  150 mg Oral Q12H  . furosemide  20 mg Oral Daily  . metoprolol succinate  50 mg Oral Daily  . omega-3 acid ethyl esters  1 g Oral Daily    Time spent on care of this patient: 35 mins   Barton Dubois , MD  [phone]   If 7PM-7AM, please contact night-coverage [email or web address] Password [email or web address] 08/15/2014, 6:16 PM   LOS: 11 days    Addendum: Positive obstruction of RCA and now status post PCI. Will require dual antiplatelet therapy. Will follow cardiology rec's  and close monitoring of Hgb and potential bleeding given use of current agents.

## 2014-08-15 NOTE — Interval H&P Note (Signed)
History and Physical Interval Note:  08/15/2014 2:09 PM  Dylan [name]  has presented today for surgery, with the diagnosis of NSTEMI, known CAD.  The various methods of treatment have been discussed with the patient and family. After consideration of risks, benefits and other options for treatment, the patient has consented to  Procedure(s): LEFT HEART CATHETERIZATION WITH CORONARY ANGIOGRAM (N/A) +/- PCI as a surgical intervention .  The patient's history has been reviewed, patient examined, no change in status, stable for surgery.  I have reviewed the patient's chart and labs.  Questions were answered to the patient's satisfaction.    Cath Lab Visit (complete for each Cath Lab visit)  Clinical Evaluation Leading to the Procedure:   ACS: Yes.    Non-ACS:    Anginal Classification: CCS II  Anti-ischemic medical therapy: Minimal Therapy (1 class of medications)  Non-Invasive Test Results: No non-invasive testing performed  Prior CABG: No previous CABG  HARDING, DAVID W

## 2014-08-15 NOTE — Progress Notes (Signed)
Subjective: No further chest pain, no SOB. Awaiting cardiac cath  Objective: Vital signs in last 24 hours: Temp:  [98.2 F (36.8 C)-98.5 F (36.9 C)] 98.5 F (36.9 C) (01/12 0722) Pulse Rate:  [82-97] 82 (01/12 1010) Resp:  [19-24] 24 (01/12 0722) BP: (124-157)/(62-89) 128/83 mmHg (01/12 1010) SpO2:  [96 %-100 %] 98 % (01/12 0722) Weight:  [304 lb 10.8 oz (138.2 kg)] 304 lb 10.8 oz (138.2 kg) (01/11 2307) Weight change:  Last BM Date: 08/14/14 Intake/Output from previous day: 01/11 0701 - 01/12 0700 In: 1490.8 [P.O.:360; I.V.:1130.8] Out: 2925 [Urine:2925] Intake/Output this shift: Total I/O In: 650.4 [I.V.:650.4] Out: 425 [Urine:425]  PE: General:Pleasant affect, NAD Skin:Warm and dry, brisk capillary refill HEENT:normocephalic, sclera clear, mucus membranes moist Heart:S1S2 RRR without murmur, gallup, rub or click Lungs:clear without rales, rhonchi, or wheezes STM:HDQQ, non tender, + BS, do not palpate liver spleen or masses Ext:tr to 1+ lower ext edema, 2+ pedal pulses, 2+ radial pulses Neuro:alert and oriented, MAE, follows commands, + facial symmetry Tele:  SR rare PVC  Lab Results:  Recent Labs  08/13/14 0300 08/15/14 0323  WBC 6.1 6.2  HGB 11.5* 13.0  HCT 35.7* 40.6  PLT 260 299   BMET  Recent Labs  08/13/14 0300 08/15/14 0323  NA 137 137  K 3.9 3.7  CL 104 101  CO2 29 33*  GLUCOSE 104* 116*  BUN 13 9  CREATININE 1.27 1.13  CALCIUM 7.7* 7.8*    Recent Labs  08/14/14 1030 08/14/14 1655  TROPONINI 2.67* 3.40*    Lab Results  Component Value Date   CHOL 201* 04/18/2013   HDL 28* 04/18/2013   LDLCALC UNABLE TO CALCULATE IF TRIGLYCERIDE OVER 400 mg/dL 04/18/2013   TRIG 585* 04/18/2013   CHOLHDL 7.2 04/18/2013   Lab Results  Component Value Date   HGBA1C 6.3* 04/19/2013     Lab Results  Component Value Date   TSH 0.999 07/01/2010    Hepatic Function Panel  Recent Labs  08/15/14 0323  PROT 5.0*  ALBUMIN 2.2*    AST 57*  ALT 51  ALKPHOS 81  BILITOT 0.5   No results for input(s): CHOL in the last 72 hours. No results for input(s): PROTIME in the last 72 hours.     Studies/Results: No results found.  Medications: I have reviewed the patient's current medications. Scheduled Meds: . allopurinol  300 mg Oral Daily  . aspirin EC  81 mg Oral Daily  . furosemide  20 mg Oral Daily  . metoprolol succinate  50 mg Oral Daily  . omega-3 acid ethyl esters  1 g Oral Daily  . sodium chloride  3 mL Intravenous Q12H   Continuous Infusions: . sodium chloride Stopped (08/15/14 0400)  . sodium chloride 1 mL/kg/hr (08/15/14 1100)  . heparin 2,300 Units/hr (08/15/14 1100)   PRN Meds:.sodium chloride, acetaminophen **OR** acetaminophen, HYDROmorphone (DILAUDID) injection, HYDROmorphone, nitroGLYCERIN, ondansetron **OR** ondansetron (ZOFRAN) IV, promethazine, sodium chloride, zolpidem  Last cath 04/18/13 no intervention:  Expand All Collapse All   ANGIOGRAPHY:  1. Left main: Normal and bifurcated into an LAD and left circumflex system. 2. LAD: Very mild calcification with mild luminal regularities without significant obstruction. The vessel gave rise to 2 diagonal vessels and several septal perforating arteries. 3. Left circumflex: Mild luminal regularities without significant obstruction.  4. Right coronary artery: Long area of tandem stenting extending osculate to the mid segment. In the region of the prior angioscope Cutting Balloon  it was smooth residual narrowing of 20 to less than 30% in the mid RCA after the anterior artery marginal takeoff. No significant stenoses were demonstrated.  5. Left ventriculography revealed normal LV contractility with ejection fraction of 55%. Was a minimal region of residual mild inferior hypocontractility relative to the other walls.       Assessment/Plan:48 y.o. male w/ PMHx significant for HTN, hyperlipidemia intolerant of statins, CAD s/pNSTEMI 2011 with PCI to  RCA, followup angiosculpt of RCA instent stenosis who initially was admitted 10 days ago with acute onset of abdominal discomfort and was subsequently found to have thrombosed SMV with extension to the portal vein (thought to be due to testosterone replacement). Underwent lysis by IR a few days later and has been recovering since. During his admission he developed CP and cardiology was consulted.   CAD/NSTEMI- He has a history of PCI of RCA in 2011 and then repeat in 2013 for ISR.  -- Patient had CP similar to prior angina in the past with ST elevation in III that has now improved. Now pain free but Trop increased to 2.6--> 3.4 c/w NSTEMI. He only had 5 minutes of CP. CK-MB elevated to 29.2 -- 2D ECHO done today pending -- Given degree of troponin leak with classic anginal symptoms and isolated ST elevation recommend proceeding with cath to delineate patency of RCA. PLanned for LHC today at 1 pm -- Continue heparin for mesenteric vein thrombosis, continue ASA and BB.   HLD- Pt intolerant of statins.  Superior mesenteric vein thrombosis with lysis, now on heaprin with plan for Pradaxa at discharge. Hypercoag Panel pending.   H/O: CVA-post cardiac cath, minimal speech residual 11/11 post PCI  HTN- currently well controlled on BB    LOS: 11 days   Time spent with pt. :15 minutes. Angelena Form R  PA-c  08/15/2014, 1:31 PM

## 2014-08-15 NOTE — Progress Notes (Signed)
ANTICOAGULATION CONSULT NOTE - Follow Up Consult  Pharmacy Consult for Heparin Indication: PE  Allergies  Allergen Reactions  . Ace Inhibitors Cough  . Lipitor [Atorvastatin] Other (See Comments)    myalgia  . Codeine Itching    Patient Measurements: Height: 6\' 2"  (188 cm) Weight: (!) 304 lb 10.8 oz (138.2 kg) IBW/kg (Calculated) : 82.2 Heparin Dosing Weight:    Vital Signs: Temp: 98.1 F (36.7 C) (01/12 1643) Temp Source: Oral (01/12 1643) BP: 131/84 mmHg (01/12 1643) Pulse Rate: 90 (01/12 1643)  Labs:  Recent Labs  08/13/14 0300  08/14/14 0504 08/14/14 1030 08/14/14 1226 08/14/14 1655 08/15/14 0323  HGB 11.5*  --   --   --   --   --  13.0  HCT 35.7*  --   --   --   --   --  40.6  PLT 260  --   --   --   --   --  299  LABPROT  --   --   --   --   --   --  15.0  INR  --   --   --   --   --   --  1.17  HEPARINUNFRC 0.43  --  0.47  --   --   --  0.38  CREATININE 1.27  --   --   --   --   --  1.13  CKTOTAL  --   --   --   --  193  --   --   CKMB  --   --   --   --  29.2*  --   --   TROPONINI  --   < > 0.58* 2.67*  --  3.40*  --   < > = values in this interval not displayed.  Estimated Creatinine Clearance: 117 mL/min (by C-G formula based on Cr of 1.13).   Assessment: SMV thrombus on IV heparin. s/p cath directed lysis with alteplase 1/5-1/7.  Pt with bloody BM which is to be expected per Dr. Pascal Lux due to concern for ischemia on recent abd CT. Hg 13  PLT WNL. LE doppler neg for DVT. SMV thrombus thought to be provoked by testosterone injections  Hypercoag panel in process. Plan to resume heparin tonight and Pradaxa in AM. Heparin level 0.38 this AM in goal so will resume at previous rate. -1/12 Cath: EF 55-60%, Successful PTCA of in-stent thrombosis of mid RCA.  Goal of Therapy:  Heparin level 0.3-0.7 units/ml Monitor platelets by anticoagulation protocol: Yes   Plan:  Resume heparin at 2345 at previous rate of 2300 units/hr. Pradaxa to resume in  AM.   Babbette Dalesandro S. Alford Highland, PharmD, BCPS Clinical Staff Pharmacist Pager [phone]  Eilene Ghazi Stillinger 08/15/2014,5:11 PM

## 2014-08-15 NOTE — CV Procedure (Signed)
CARDIAC CATHETERIZATION and PERCUTANEOUS CORONARY INTERVENTION REPORT  NAME:  Dylan [name]   MRN: [id number] DOB:  20-Jan-1966   ADMIT DATE: 08/04/2014 Procedure Date: 08/15/2014  INTERVENTIONAL CARDIOLOGIST: [name], M.D., MS PRIMARY CARE PROVIDER: [name], MD PRIMARY CARDIOLOGIST: Formerly pt. Of Dr. Sallyanne Kuster (lost to follow-up)  PATIENT:  Dylan [name] is a 49 y.o. male with history of inferior NSTEMI status post PCI to the mid RCA followed by redo PCI more proximally in the RCA in 2013 with treatment of proximal in-stent restenosis with a new stent proximally. He has been lost to follow-up for cardiology and has not been taking Plavix for over a year. 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 This was thought to be related to testosterone replacement. Treated with thrombolytic lysis VIR. Following this the patient developed acute onset of substernal chest pressure on the evening of the 10th/morning left of the 11th. He ended up ruling in with a non-ST elevation MI and is now referred for cardiac catheterization.  PRE-OPERATIVE DIAGNOSIS:    Non-STEMI  Recent Mesenteric Vein Thrombosis  PROCEDURES PERFORMED:    Left Heart Catheterization with Native Coronary Angiography  via Right Common Femoral Artery Artery   Left Ventriculography  PROCEDURE: The patient was brought to the [facility]nd [facility] in the fasting state and prepped and draped in the usual sterile fashion for Common Femoral artery access.  Radial access was not used - pt has h/o post procedure TIA following radial cath.  Sterile technique was used including antiseptics, cap, gloves, gown, hand hygiene, mask and sheet. Skin prep: Chlorhexidine.   Consent: Risks of procedure as well as the alternatives and risks of each were explained to the (patient/caregiver).  Consent for procedure obtained.   Time Out: Verified patient identification, verified procedure, site/side was marked, verified correct patient position, special equipment/implants available, medications/allergies/relevent history reviewed, required imaging and test results available. Performed.   Access:   Right Common Femoral Artery: 5 Fr Sheath -  fluoroscopically guided modified Seldinger Technique   Left Heart Catheterization: 5 Fr Catheters advanced or exchanged over a standard J-wire; JL4 catheter advanced first.  Left Coronary Artery Cineangiography: JL4 Catheter  Right Coronary Artery Cineangiography: JR4 Catheter   LV Hemodynamics (LV Gram): Angled pigtail  Sheath removed in the Cath Lab with arteriotomy closed using a 6 French Angio-Seal closure device for hemostasis.  Angio-Seal deployed at 1550 hrs.  FINDINGS:  Hemodynamics:   Central Aortic Pressure / Mean: 105/71/87 mmHg  Left Ventricular Pressure / LVEDP: 108/12/23 mmHg  Left Ventriculography:  EF: 55-60 %  Wall Motion: Essentially normal with exception of mild hypodense kinesis of the mid inferior wall  Coronary Anatomy:  Dominance: Right  Left Main: Large-caliber vessel with mild luminal irregularities. It bifurcates distally into the LAD and circumflex. LAD: Large-caliber vessel with diffuse 20-30% stenoses. There is a roughly 40% stenosis just prior to D2 takeoff.   D1: very large proximal diagonal branch that courses almost as a ramus intermedius. This vessel is at least as big as the LAD. It bifurcates distally reaches the apex.  Minimal luminal irregularities.  D2: Moderate caliber vessel from the mid LAD. Bifurcates proximally into 2 small caliber branches. Angiographically normal.  Left Circumflex: Large-caliber, nondominant vessel with a 90 takeoff from the left main. The courses is a lateral OM with several distal branches.  Several small marginal branches arise from it. Diffuse tender 20% stenoses  with a roughly 40% stenosis in the very mid vessel as it courses of the obtuse marginal.   RCA: Large-caliber, dominant vessel with multiple stents from proximal to the crux. There are several areas of what appears to be thrombotic stenosis representing in-stent restenosis with the most dramatic being just prior to the crux of roughly 70%. RV marginal there is maybe 50% thrombotic stenosis.  The vessel bifurcates distally into the Right Posterior Descending Artery (RPDA) and the Right Posterior AV Groove Branch (RPAV).  Beyond the stented segment there is no significant disease in the main RCA.  RPDA: Large-caliber vessel with proximal long eccentric 40% stenosis before the vessel normalizes. It reaches all the way to the apex. There is a small branch distally.  RPL Sysytem:The RPAV begins as a moderate large-caliber vessel and terminates as a mold moderate caliber posterolateral branch as well as the AV nodal artery.  After reviewing the initial angiography, the culprit lesion was thought to be in-stent thrombotic stenosis in the mid RCA.  Preparation were made to proceed with PTCA on this lesion.  Percutaneous Coronary Intervention:  Sheath exchanged for 6 Fr Guide: 6 Fr   JR4 Guidewire: [place] Predilation Balloon: Angiosculpt  3.0 mm x 15 mm;   10 Atm x 45 Sec x 2 Post-dilation Balloon: [place] Emerge 3.5 mm x 12 mm;   14 Atm x 30 Sec,  x 4 inflations  Final Diameter: 3.5 mm  Post deployment angiography in multiple views, with and without guidewire in place revealed excellent stent deployment and lesion coverage.  There was no evidence of dissection or perforation.  There remain some residual thrombus albeit with significantly improved flow. At reviewing the images with colleagues, we decided that the best course of action would be to treat with antiplatelet and antithrombotic agents, not place a new stent  MEDICATIONS:  Anesthesia:  Local Lidocaine 18 ml  Sedation:  4 mg IV Versed, 100  mcg IV fentanyl ;  Omnipaque Contrast: 130 ml  Anticoagulation:  Angiomax Bolus & drip - 2nd bag hung at end of PTCA (to run until complete)  Anti-Platelet Agent:  Plavix 600 mg PO; Aggrestat Bolus x 1  PATIENT DISPOSITION:    The patient was transferred to the PACU holding area in a hemodynamicaly stable, chest pain free condition.  The patient tolerated the procedure well, and there were no complications.  EBL:   < 15 ml  The patient was stable before, during, and after the procedure.  POST-OPERATIVE DIAGNOSIS:    Severe in-stent thrombosis of the distal portion of the significant stented segment of the RCA.  Successful PTCA only of the in-stent segments in the mid RCA with minimal residual thrombus present, but TIMI-3 flow.  Successful Angio-Seal closure of the RFA arteriotomy  Normal LVEF with moderate to severely elevated LVEDP (prior to urination of 500 mL)  Suspected this may be related to the same systemic thrombotic process that led to mesenteric/splenic vein thrombosis.  PLAN OF CARE:  Transfer to [facility] postprocedure unit for standard post PCI care.  Continue Angiomax infusion until current bag complete  Would continue aspirin plus Plavix for minimum one month, with then stop aspirin as the patient will be on Pradaxa for anticoagulation.    Lifelong Plavix  Elevated LVEDP, would continue diuresis.  Can convert back to IV heparin following the completion of Angiomax. Start Pradaxa in AM if according to [facility] plan.  [place], [place]  Viona Gilmore, M.D., M.S. Interventional Cardiologist   Pager # [phone]

## 2014-08-15 NOTE — H&P (View-Only) (Signed)
Subjective: No further chest pain, no SOB. Awaiting cardiac cath  Objective: Vital signs in last 24 hours: Temp:  [98.2 F (36.8 C)-98.5 F (36.9 C)] 98.5 F (36.9 C) (01/12 0722) Pulse Rate:  [82-97] 82 (01/12 1010) Resp:  [19-24] 24 (01/12 0722) BP: (124-157)/(62-89) 128/83 mmHg (01/12 1010) SpO2:  [96 %-100 %] 98 % (01/12 0722) Weight:  [304 lb 10.8 oz (138.2 kg)] 304 lb 10.8 oz (138.2 kg) (01/11 2307) Weight change:  Last BM Date: 08/14/14 Intake/Output from previous day: 01/11 0701 - 01/12 0700 In: 1490.8 [P.O.:360; I.V.:1130.8] Out: 2925 [Urine:2925] Intake/Output this shift: Total I/O In: 650.4 [I.V.:650.4] Out: 425 [Urine:425]  PE: General:Pleasant affect, NAD Skin:Warm and dry, brisk capillary refill HEENT:normocephalic, sclera clear, mucus membranes moist Heart:S1S2 RRR without murmur, gallup, rub or click Lungs:clear without rales, rhonchi, or wheezes WUJ:WJXB, non tender, + BS, do not palpate liver spleen or masses Ext:tr to 1+ lower ext edema, 2+ pedal pulses, 2+ radial pulses Neuro:alert and oriented, MAE, follows commands, + facial symmetry Tele:  SR rare PVC  Lab Results:  Recent Labs  08/13/14 0300 08/15/14 0323  WBC 6.1 6.2  HGB 11.5* 13.0  HCT 35.7* 40.6  PLT 260 299   BMET  Recent Labs  08/13/14 0300 08/15/14 0323  NA 137 137  K 3.9 3.7  CL 104 101  CO2 29 33*  GLUCOSE 104* 116*  BUN 13 9  CREATININE 1.27 1.13  CALCIUM 7.7* 7.8*    Recent Labs  08/14/14 1030 08/14/14 1655  TROPONINI 2.67* 3.40*    Lab Results  Component Value Date   CHOL 201* 04/18/2013   HDL 28* 04/18/2013   LDLCALC UNABLE TO CALCULATE IF TRIGLYCERIDE OVER 400 mg/dL 04/18/2013   TRIG 585* 04/18/2013   CHOLHDL 7.2 04/18/2013   Lab Results  Component Value Date   HGBA1C 6.3* 04/19/2013     Lab Results  Component Value Date   TSH 0.999 07/01/2010    Hepatic Function Panel  Recent Labs  08/15/14 0323  PROT 5.0*  ALBUMIN 2.2*    AST 57*  ALT 51  ALKPHOS 81  BILITOT 0.5   No results for input(s): CHOL in the last 72 hours. No results for input(s): PROTIME in the last 72 hours.     Studies/Results: No results found.  Medications: I have reviewed the patient's current medications. Scheduled Meds: . allopurinol  300 mg Oral Daily  . aspirin EC  81 mg Oral Daily  . furosemide  20 mg Oral Daily  . metoprolol succinate  50 mg Oral Daily  . omega-3 acid ethyl esters  1 g Oral Daily  . sodium chloride  3 mL Intravenous Q12H   Continuous Infusions: . sodium chloride Stopped (08/15/14 0400)  . sodium chloride 1 mL/kg/hr (08/15/14 1100)  . heparin 2,300 Units/hr (08/15/14 1100)   PRN Meds:.sodium chloride, acetaminophen **OR** acetaminophen, HYDROmorphone (DILAUDID) injection, HYDROmorphone, nitroGLYCERIN, ondansetron **OR** ondansetron (ZOFRAN) IV, promethazine, sodium chloride, zolpidem  Last cath 04/18/13 no intervention:  Expand All Collapse All   ANGIOGRAPHY:  1. Left main: Normal and bifurcated into an LAD and left circumflex system. 2. LAD: Very mild calcification with mild luminal regularities without significant obstruction. The vessel gave rise to 2 diagonal vessels and several septal perforating arteries. 3. Left circumflex: Mild luminal regularities without significant obstruction.  4. Right coronary artery: Long area of tandem stenting extending osculate to the mid segment. In the region of the prior angioscope Cutting Balloon  it was smooth residual narrowing of 20 to less than 30% in the mid RCA after the anterior artery marginal takeoff. No significant stenoses were demonstrated.  5. Left ventriculography revealed normal LV contractility with ejection fraction of 55%. Was a minimal region of residual mild inferior hypocontractility relative to the other walls.       Assessment/Plan:49 y.o. male w/ PMHx significant for HTN, hyperlipidemia intolerant of statins, CAD s/pNSTEMI 2011 with PCI to  RCA, followup angiosculpt of RCA instent stenosis who initially was admitted 10 days ago with acute onset of abdominal discomfort and was subsequently found to have thrombosed SMV with extension to the portal vein (thought to be due to testosterone replacement). Underwent lysis by IR a few days later and has been recovering since. During his admission he developed CP and cardiology was consulted.   CAD/NSTEMI- He has a history of PCI of RCA in 2011 and then repeat in 2013 for ISR.  -- Patient had CP similar to prior angina in the past with ST elevation in III that has now improved. Now pain free but Trop increased to 2.6--> 3.4 c/w NSTEMI. He only had 5 minutes of CP. CK-MB elevated to 29.2 -- 2D ECHO done today pending -- Given degree of troponin leak with classic anginal symptoms and isolated ST elevation recommend proceeding with cath to delineate patency of RCA. PLanned for LHC today at 1 pm -- Continue heparin for mesenteric vein thrombosis, continue ASA and BB.   HLD- Pt intolerant of statins.  Superior mesenteric vein thrombosis with lysis, now on heaprin with plan for Pradaxa at discharge. Hypercoag Panel pending.   H/O: CVA-post cardiac cath, minimal speech residual 11/11 post PCI  HTN- currently well controlled on BB    LOS: 11 days   Time spent with pt. :15 minutes. Angelena Form R  PA-c  08/15/2014, 1:31 PM

## 2014-08-15 NOTE — Progress Notes (Signed)
Echocardiogram 2D Echocardiogram has been performed.  Joelene Millin 08/15/2014, 12:31 PM

## 2014-08-16 ENCOUNTER — Telehealth: Payer: Self-pay | Admitting: Hematology and Oncology

## 2014-08-16 ENCOUNTER — Other Ambulatory Visit: Payer: Self-pay | Admitting: Physician Assistant

## 2014-08-16 LAB — CBC
HCT: 37 % — ABNORMAL LOW (ref 39.0–52.0)
Hemoglobin: 12 g/dL — ABNORMAL LOW (ref 13.0–17.0)
MCH: 29.1 pg (ref 26.0–34.0)
MCHC: 32.4 g/dL (ref 30.0–36.0)
MCV: 89.6 fL (ref 78.0–100.0)
PLATELETS: 331 10*3/uL (ref 150–400)
RBC: 4.13 MIL/uL — ABNORMAL LOW (ref 4.22–5.81)
RDW: 15 % (ref 11.5–15.5)
WBC: 6 10*3/uL (ref 4.0–10.5)

## 2014-08-16 LAB — BASIC METABOLIC PANEL
Anion gap: 4 — ABNORMAL LOW (ref 5–15)
BUN: 6 mg/dL (ref 6–23)
CO2: 30 mmol/L (ref 19–32)
Calcium: 7.6 mg/dL — ABNORMAL LOW (ref 8.4–10.5)
Chloride: 99 mEq/L (ref 96–112)
Creatinine, Ser: 1.06 mg/dL (ref 0.50–1.35)
GFR calc Af Amer: >90 mL/min (ref >90)
GFR calc non Af Amer: 81 mL/min — ABNORMAL LOW (ref >90)
GLUCOSE: 94 mg/dL (ref 70–99)
Potassium: 3.4 mmol/L — ABNORMAL LOW (ref 3.5–5.1)
SODIUM: 133 mmol/L — AB (ref 135–145)

## 2014-08-16 LAB — PROTHROMBIN GENE MUTATION

## 2014-08-16 LAB — HEPARIN LEVEL (UNFRACTIONATED): Heparin Unfractionated: 0.15 IU/mL — ABNORMAL LOW (ref 0.30–0.70)

## 2014-08-16 MED ORDER — TRAMADOL HCL 50 MG PO TABS: 50.0000 mg | ORAL_TABLET | Freq: Four times a day (QID) | ORAL | Status: DC | PRN

## 2014-08-16 MED ORDER — NITROGLYCERIN 0.4 MG SL SUBL: 0.4000 mg | SUBLINGUAL_TABLET | SUBLINGUAL | Status: DC | PRN

## 2014-08-16 MED ORDER — DABIGATRAN ETEXILATE MESYLATE 150 MG PO CAPS: 150.0000 mg | ORAL_CAPSULE | Freq: Two times a day (BID) | ORAL | Status: DC

## 2014-08-16 MED ORDER — CLOPIDOGREL BISULFATE 75 MG PO TABS: 75.0000 mg | ORAL_TABLET | Freq: Every day | ORAL | Status: DC

## 2014-08-16 MED ORDER — ACETAMINOPHEN 500 MG PO TABS: 500.0000 mg | ORAL_TABLET | ORAL | Status: DC | PRN

## 2014-08-16 MED FILL — Sodium Chloride IV Soln 0.9%: INTRAVENOUS | Qty: 50 | Status: AC

## 2014-08-16 NOTE — Discharge Instructions (Addendum)
Information on my medicine - Pradaxa (dabigatran)  This medication education was reviewed with me or my healthcare representative as part of my discharge preparation.  The pharmacist that spoke with me during my hospital stay was:  [facility], Margot Chimes, [facility]  Why was Pradaxa prescribed for you? Pradaxa was prescribed to treat blood clots that may have been found in the veins of your legs (deep vein thrombosis) or in your lungs (pulmonary embolism) and to reduce the risk of them occurring again.  Pradaxa will take the place of the injectable anticoagulant medication you have been receiving for this condition for the last 5-10 days.  What do you Need to know about PradAXa? Take your Pradaxa TWICE DAILY - one capsule in the morning and one tablet in the evening with or without food.  It would be best to take the doses about the same time each day.  The capsules should not be broken, chewed or opened - they must be swallowed whole.  Do not store Pradaxa in other medication containers - once the bottle is opened the Pradaxa should be used within FOUR months; throw away any capsules that havent been by that time.  Take Pradaxa exactly as prescribed by your doctor.  DO NOT stop taking Pradaxa without talking to the doctor who prescribed the medication.  Refill your prescription before you run out.  After discharge, you should have regular check-up appointments with your healthcare provider that is prescribing your Pradaxa.  In the future your dose may need to be changed if your kidney function or weight changes by a significant amount.  What do you do if you miss a dose? If you miss a dose, take it as soon as you remember on the same day.  If your next dose is less than 6 hours away, skip the missed dose.  Do not take two doses of PRADAXA at the same time.  Important Safety Information A possible side effect of Pradaxa is bleeding. You should call your healthcare provider right away if  you experience any of the following: ? Bleeding from an injury or your nose that does not stop. ? Unusual colored urine (red or dark brown) or unusual colored stools (red or black). ? Unusual bruising for unknown reasons. ? A serious fall or if you hit your head (even if there is no bleeding).  Some medicines may interact with Pradaxa and might increase your risk of bleeding or clotting while on Pradaxa. To help avoid this, consult your healthcare provider or pharmacist prior to using any new prescription or non-prescription medications, including herbals, vitamins, non-steroidal anti-inflammatory drugs (NSAIDs) and supplements.  This website has more information on Pradaxa (dabigatran): www.RuleEnforcement.cz.  Coronary Angiogram with Stent Coronary angiography with stent placement is a procedure to widen or open a narrow blood vessel of the heart (coronary artery). When a coronary artery becomes partially blocked, it decreases blood flow to that area. This may lead to chest pain or a heart attack (myocardial infarction). Arteries may become blocked by cholesterol buildup (plaque) in the lining or wall.  A stent is a small piece of metal that looks like a mesh or a spring. Stent placement may be done right after a coronary angiography in which a blocked artery is found or as a treatment for a heart attack.  LET [facility] CARE PROVIDER KNOW ABOUT:  Any allergies you have.   All medicines you are taking, including vitamins, herbs, eye drops, creams, and over-the-counter medicines.   Previous  problems you or members of your family have had with the use of anesthetics.   Any blood disorders you have.   Previous surgeries you have had.   Medical conditions you have. RISKS AND COMPLICATIONS Generally, coronary angiography with stent is a safe procedure. However, problems can occur and include:  Damage to the heart or its blood vessels.   A return of blockage.   Bleeding, infection,  or bruising at the insertion site.   A collection of blood under the skin (hematoma) at the insertion site.  Blood clot in another part of the body.   Kidney injury.   Allergic reaction to the dye or contrast used.   Bleeding into the abdomen (retroperitoneal bleeding). BEFORE THE PROCEDURE  Do not eat or drink anything after midnight on the night before the procedure or as directed by your health care provider.  Ask your health care provider about changing or stopping your regular medicines. This is especially important if you are taking diabetes medicines or blood thinners.  Your health care provider will make sure you understand the procedure as well as the risks and potential problems associated with the procedure.  PROCEDURE  You may be given a medicine to help you relax before and during the procedure (sedative). This medicine will be given through an IV tube that is put into one of your veins.   The area where the catheter will be inserted will be shaved and cleaned. This is usually done in the groin but may be done in the fold of your arm (near your elbow) or in the wrist.   A medicine will be given to numb the area where the catheter will be inserted (local anesthetic).   The catheter will be inserted into an artery using a guide wire. A type of X-ray (fluoroscopy) will be used to help guide the catheter to the opening of the blocked artery.   A dye will then be injected into the catheter, and X-rays will be taken. The dye will help to show where any narrowing or blockages are located in the heart arteries.   A tiny wire will be guided to the blocked spot, and a balloon will be inflated to make the artery wider. The stent will be expanded and will crush the plaque into the wall of the vessel. The stent will hold the area open like a scaffolding and improve the blood flow.   Sometimes the artery may be made wider using a laser or other tools to remove plaque.    When the blood flow is better, the catheter will be removed. The lining of the artery will grow over the stent, which stays where it was placed.  AFTER THE PROCEDURE  If the procedure is done through the leg, you will be kept in bed lying flat for about 6 hours. You will be instructed to not bend or cross your legs.   The insertion site will be checked frequently.   The pulse in your feet or wrist will be checked frequently.   Additional blood tests, X-rays, and electrocardiography may be done. Document Released: 01/25/2003 Document Revised: 12/05/2013 Document Reviewed: 01/27/2013 [facility] Patient Information 2015 [place], [place]. This information is not intended to replace advice given to you by your health care provider. Make sure you discuss any questions you have with your health care provider.

## 2014-08-16 NOTE — Telephone Encounter (Signed)
PATIENT SCHEDULED TO SEE DR. Alvy Bimler 01/28 @ 1:30.

## 2014-08-16 NOTE — Discharge Summary (Signed)
DISCHARGE SUMMARY  Dylan [name]  MR#: [id number]  DOB:23-Sep-1965  Date of Admission: 08/04/2014 Date of Discharge: 08/16/2014  Attending Physician:Antoney Biven T  Patient's WUJ:WJXBJ, Dylan Charters, MD  Consults: Hematology Cardiology Gen Surgery Interventional Radiology  PCCM  Disposition: D/C home   Follow-up Appts:     Follow-up Information    Follow up with [name], Dylan Charters, MD. Schedule an appointment as soon as possible for a visit in 1 week.   Specialty:  Internal Medicine   Contact information:   [address] [place] [address] [phone]       Follow up with Dylan Man, MD. Schedule an appointment as soon as possible for a visit in 2 weeks.   Specialty:  Cardiology   Contact information:   [address] [name] [address] [phone]       Follow up with [facility], NI, MD On 08/31/2014.   Specialty:  Hematology and Oncology   Why:  1:30 PM   Contact information:   [place] [address] [phone]      Tests Needing Follow-up: -continue aspirin plus Plavix for minimum one month, with then stop aspirin as the patient will be on Pradaxa for anticoagulation - lifelong Plavix -referral to [facility] lipid clinic needed at time of Cardiology f/u appointment  -NO MORE hormone replacement -routine monitoring of CBC, LFTs, renal function, and electrolytes is indicated   Discharge Diagnoses: SMV and portal vein thrombus  NSTEMI in pt w/ hx of CAD s/p PCI 2013 - s/p PTCA distal RCA pre-existing stent Ileus - mesenteric ischemia w/ PSBP Obesity - Body mass index is 39.5 kg/(m^2).  Hx of CVA Transaminitis  Acute kindey injury B LE edema Hyperlipidemia   Initial presentation: 49 yo M who presented 08/04/14 with 4 days diffuse abd pain, found to have small bowel enteritis along with occlusive thrombus in SMV extending through main portal vein. Underwent lysis by IR on 08/08/14 and returned to ICU on tPA  infusion.  Hospital Course:  The specific acute issues attended to during the hospitalization are as follows:  SMV and portal vein thrombus  S/p catheter directed thrombolytic therapy d/c'd 1/7 - provoked by weekly testosterone injections in setting of multiple other risk factors - Heme has ok'd transition to Pradaxa at d/c - NO MORE hormone replacement - tx w/ IV heparin th/o hospital stay   NSTEMI in pt w/ hx of CAD s/p PCI 2013 -during hospital stay patient had CP similar to prior angina with ST elevation in III - taken to cath lab 1/12 > severe in stent thrombosis of distal RCA stent s/p successful PTCA and Angio-Seal closure of the RFA arteriotomy -continue aspirin plus Plavix for minimum one month, with then stop aspirin as the patient will be on Pradaxa for anticoagulation. Lifelong Plavix  Ileus - mesenteric ischemia w/ PSBP Per Gen Surg - improved - now on reg diet - felt to be due to SMV/portal vein thrombosis  Obesity - Body mass index is 39.5 kg/(m^2).   Hx of CVA Occurred during a cardiac cath  Transaminitis  Hepatitis panel negative - likely due to ETOH as well as portal vein thrombosis - essentially resolved at time of d/c    Acute kindey injury Resolved  B LE edema  In the setting of low albumin, ascites, portal vein thrombosis. +9.5L fluid balance - [place] of lower extremities is negative for DVT - diurese - TED hose   HLD statin intolerant with markedly  abnormal lipids on last check 04/2014 - needs to be referred to [facility] lipid clinic on f/u w/ Cardiology to see if he would be a candidate for PCSK 9 drug    Medication List    STOP taking these medications        azithromycin 500 MG tablet  Commonly known as:  ZITHROMAX     cefdinir 300 MG capsule  Commonly known as:  OMNICEF     predniSONE 5 MG Tabs tablet  Commonly known as:  STERAPRED UNI-PAK     SAXENDA 18 MG/3ML Sopn  Generic drug:  Liraglutide -Weight Management     testosterone cypionate 200 MG/ML  injection  Commonly known as:  DEPOTESTOTERONE CYPIONATE      TAKE these medications        acetaminophen 500 MG tablet  Commonly known as:  TYLENOL  Take 1 tablet (500 mg total) by mouth every 4 (four) hours as needed for mild pain, fever or headache.     allopurinol 300 MG tablet  Commonly known as:  ZYLOPRIM  Take 1 tablet by mouth daily.     aspirin 81 MG chewable tablet  Chew 1 tablet (81 mg total) by mouth daily.     clopidogrel 75 MG tablet  Commonly known as:  PLAVIX  Take 1 tablet (75 mg total) by mouth daily with breakfast.     dabigatran 150 MG Caps capsule  Commonly known as:  PRADAXA  Take 1 capsule (150 mg total) by mouth every 12 (twelve) hours.     fenofibrate 160 MG tablet  Take 1 tablet (160 mg total) by mouth daily.     furosemide 20 MG tablet  Commonly known as:  LASIX  Take 20 mg by mouth daily.     metoprolol succinate 50 MG 24 hr tablet  Commonly known as:  TOPROL-XL  Take 50 mg by mouth daily. Take with or immediately following a meal.     nitroGLYCERIN 0.4 MG SL tablet  Commonly known as:  NITROSTAT  Place 1 tablet (0.4 mg total) under the tongue every 5 (five) minutes x 3 doses as needed for chest pain.     olmesartan-hydrochlorothiazide 40-25 MG per tablet  Commonly known as:  BENICAR HCT  Take 1 tablet by mouth daily.     omega-3 acid ethyl esters 1 G capsule  Commonly known as:  LOVAZA  Take 1 capsule (1 g total) by mouth daily. <please make appointment>     traMADol 50 MG tablet  Commonly known as:  ULTRAM  Take 1-2 tablets (50-100 mg total) by mouth every 6 (six) hours as needed for moderate pain or severe pain.        Day of Discharge BP 152/89 mmHg  Pulse 100  Temp(Src) 98.7 F (37.1 C) (Oral)  Resp 18  Ht 6\' 2"  (1.88 m)  Wt 131.7 kg (290 lb 5.5 oz)  BMI 37.26 kg/m2  SpO2 96%  Physical Exam: General: No acute respiratory distress Lungs: Clear to auscultation bilaterally without wheezes or crackles Cardiovascular:  Regular rate and rhythm without murmur gallop or rub normal S1 and S2 Abdomen: Obese, nontender, nondistended, soft, bowel sounds positive, no rebound, no ascites, no appreciable mass Extremities: No significant cyanosis, clubbing;  1+ edema bilateral lower extremities  Basic Metabolic Panel:  Recent Labs Lab 08/10/14 0500 08/11/14 1246 08/11/14 1840 08/12/14 0240 08/12/14 1025 08/13/14 0300 08/15/14 0323 08/16/14 0253  NA 136 135 136  --  135 137 137 133*  K  4.7 4.1 3.7  --  4.1 3.9 3.7 3.4*  CL 105 102 101  --  103 104 101 99  CO2 26 27 29   --  27 29 33* 30  GLUCOSE 104* 121* 109*  --  119* 104* 116* 94  BUN 18 16 16   --  15 13 9 6   CREATININE 1.16 1.14 1.33  --  1.24 1.27 1.13 1.06  CALCIUM 7.8* 7.9* 8.1*  --  7.9* 7.7* 7.8* 7.6*  MG 1.7 1.7  --  1.9  --  1.7  --   --   PHOS 3.0 3.3  --   --   --   --   --   --     Liver Function Tests:  Recent Labs Lab 08/10/14 0500 08/13/14 0300 08/15/14 0323  AST 138* 57* 57*  ALT 108* 62* 51  ALKPHOS 66 76 81  BILITOT 1.0 0.7 0.5  PROT 4.7* 4.3* 5.0*  ALBUMIN 2.2* 1.9* 2.2*   Coags:  Recent Labs Lab 08/15/14 0323  INR 1.17   CBC:  Recent Labs Lab 08/12/14 0112 08/12/14 0240 08/13/14 0300 08/15/14 0323 08/16/14 0253  WBC 7.1 7.0 6.1 6.2 6.0  HGB 12.4* 13.3 11.5* 13.0 12.0*  HCT 37.5* 41.1 35.7* 40.6 37.0*  MCV 88.9 91.3 89.7 92.5 89.6  PLT 259 262 260 299 331    Cardiac Enzymes:  Recent Labs Lab 08/13/14 2339 08/14/14 0504 08/14/14 1030 08/14/14 1226 08/14/14 1655  CKTOTAL  --   --   --  193  --   CKMB  --   --   --  29.2*  --   TROPONINI 0.05* 0.58* 2.67*  --  3.40*    CBG:  Recent Labs Lab 08/10/14 0501 08/10/14 0821 08/11/14 0829 08/11/14 1659 08/12/14 0819  GLUCAP 90 120* 100* 103* 113*    Recent Results (from the past 240 hour(s))  MRSA PCR Screening     Status: None   Collection Time: 08/08/14  5:21 PM  Result Value Ref Range Status   MRSA by PCR NEGATIVE NEGATIVE Final     Comment:        The GeneXpert MRSA Assay (FDA approved for NASAL specimens only), is one component of a comprehensive MRSA colonization surveillance program. It is not intended to diagnose MRSA infection nor to guide or monitor treatment for MRSA infections.       Time spent in discharge (includes decision making & examination of pt): >35 minutes  08/16/2014, 12:44 PM   Cherene Altes, MD [facility] Office  [phone] Pager [phone]  On-Call/Text Page:      [email or web address]      password [facility]

## 2014-08-16 NOTE — Progress Notes (Signed)
Subjective: Feeling well today. No further CP or SOB  Objective: Vital signs in last 24 hours: Temp:  [98.1 F (36.7 C)-99 F (37.2 C)] 98.5 F (36.9 C) (01/13 1194) Pulse Rate:  [82-96] 96 (01/13 0652) Resp:  [16-22] 20 (01/13 0652) BP: (104-147)/(54-98) 146/98 mmHg (01/13 0652) SpO2:  [93 %-96 %] 93 % (01/13 0652) Weight:  [290 lb 5.5 oz (131.7 kg)] 290 lb 5.5 oz (131.7 kg) (01/13 0021) Weight change: -14 lb 5.3 oz (-6.5 kg) Last BM Date: 08/14/14 Intake/Output from previous day: 01/12 0701 - 01/13 0700 In: 1384.1 [P.O.:280; I.V.:1104.1] Out: 2950 [Urine:2950] Intake/Output this shift:    PE: General:Pleasant affect, NAD Skin:Warm and dry, brisk capillary refill HEENT:normocephalic, sclera clear, mucus membranes moist Heart:S1S2 RRR without murmur, gallup, rub or click Lungs:clear without rales, rhonchi, or wheezes RDE:YCXK, non tender, + BS, do not palpate liver spleen or masses Ext:tr to 1+ lower ext edema, 2+ pedal pulses, 2+ radial pulses Neuro:alert and oriented, MAE, follows commands, + facial symmetry Tele:  SR rare PVC  Lab Results:  Recent Labs  08/15/14 0323 08/16/14 0253  WBC 6.2 6.0  HGB 13.0 12.0*  HCT 40.6 37.0*  PLT 299 331   BMET  Recent Labs  08/15/14 0323 08/16/14 0253  NA 137 133*  K 3.7 3.4*  CL 101 99  CO2 33* 30  GLUCOSE 116* 94  BUN 9 6  CREATININE 1.13 1.06  CALCIUM 7.8* 7.6*    Recent Labs  08/14/14 1030 08/14/14 1655  TROPONINI 2.67* 3.40*    Lab Results  Component Value Date   CHOL 201* 04/18/2013   HDL 28* 04/18/2013   LDLCALC UNABLE TO CALCULATE IF TRIGLYCERIDE OVER 400 mg/dL 04/18/2013   TRIG 585* 04/18/2013   CHOLHDL 7.2 04/18/2013   Lab Results  Component Value Date   HGBA1C 6.3* 04/19/2013     Lab Results  Component Value Date   TSH 0.999 07/01/2010    Hepatic Function Panel  Recent Labs  08/15/14 0323  PROT 5.0*  ALBUMIN 2.2*  AST 57*  ALT 51  ALKPHOS 81  BILITOT 0.5   No  results for input(s): CHOL in the last 72 hours. No results for input(s): PROTIME in the last 72 hours.     Studies/Results: No results found.  Medications: I have reviewed the patient's current medications. Scheduled Meds: . allopurinol  300 mg Oral Daily  . aspirin EC  81 mg Oral Daily  . clopidogrel  75 mg Oral Q breakfast  . dabigatran  150 mg Oral Q12H  . furosemide  20 mg Oral Daily  . metoprolol succinate  50 mg Oral Daily  . omega-3 acid ethyl esters  1 g Oral Daily   Continuous Infusions: . sodium chloride Stopped (08/15/14 0400)  . heparin 2,300 Units/hr (08/15/14 2321)   PRN Meds:.acetaminophen **OR** acetaminophen, HYDROmorphone (DILAUDID) injection, HYDROmorphone, nitroGLYCERIN, ondansetron **OR** ondansetron (ZOFRAN) IV, promethazine, traMADol, zolpidem    Assessment/Plan:48 y.o. male w/ PMHx significant for HTN, hyperlipidemia intolerant of statins, CAD s/pNSTEMI 2011 with PCI to RCA, followup angiosculpt of RCA instent stenosis who initially was admitted 10 days ago with acute onset of abdominal discomfort and was subsequently found to have thrombosed SMV with extension to the portal vein (thought to be due to testosterone replacement). Underwent lysis by IR a few days later and has been recovering since. During his admission he developed CP and cardiology was consulted.   CAD/NSTEMI- He has a history of  PCI of RCA in 2011 and then repeat in 2013 for ISR.  -- Patient had CP similar to prior angina in the past with ST elevation in III that has now improved. Now pain free but Trop increased to 2.6--> 3.4 c/w NSTEMI. CK-MB elevated to 29.2 -- 2D ECHO  with EF 60-65%, mod conc hypertrophy, no RWMA, G1DD  -- s/p LHC yesterday which revealed severe in stent thrombosis of distal RCA stent s/p successful PTCA and Angio-Seal closure of the RFA arteriotomy. Also mod-severely elevated LVEDP. His instent thrombosis was suspected to be related to the same systemic thrombotic  process that led to mesenteric/splenic vein thrombosis. -- Continue aspirin plus Plavix for minimum one month, with then stop aspirin as the patient will be on Pradaxa for anticoagulation. Lifelong Plavix  Mod-sev elevated LVEDP- (before voiding 500cc) no SOB, but some mild LE edema. Can probably stop fluids and continue PO Lasix. Creat stable  HLD- Pt intolerant of statins.  Superior mesenteric vein thrombosis with lysis, remains on heaprin with plan for Pradaxa at discharge. -- Hypercoag Panel pending.   H/O: CVA-post cardiac cath, minimal speech residual 11/11 post PCI  HTN- currently well controlled on BB    LOS: 12 days   Time spent with pt. :15 minutes. Angelena Form R  PA-c  08/16/2014, 7:53 AM   I have personally seen and examined patient and agree with note as outlined by Angelena Form PA.  Patient with superior mesenteric vein thrombosis s/p lysis now with episode o CP and EKG changes and ruled in for NSTEMI.  Cath yesterday with in stent thrombosis of the distal portion of the stented segment of the RCA s/p PTCA.  He needs to stay on ASA/Plavix for 1 month then stop ASA and continue Plavix since he will be on Pradaxa.  He will need lifelong Plavix.  He is statin intolerant with markedly abnormal lipids on last check 04/2014.  He needs to be referred to our lipid clinic on discharge to see if he would be a candidate for PCSK 9 drug.  He has nonobstructive disease as well.  Signed: Fransico Him, MD [facility] 08/16/2014

## 2014-08-16 NOTE — Progress Notes (Signed)
CARDIAC REHAB PHASE I   PRE:  Rate/Rhythm: 102 ST  BP:  Supine: 152/89  Sitting:   Standing:    SaO2:   MODE:  Ambulation: 600 ft   POST:  Rate/Rhythm: 117 ST  BP:  Supine:   Sitting: 162/96  Standing:    SaO2:  0805-0905 Pt walked 600 ft with steady gait. Stated felt good to be up. No CP. MI ed completed with pt who voiced understanding. Reviewed NTG use, MI restrictions, importance of plavix, diet and ex. Not interested in CRP 2. To exercise on his own.   Graylon Good, RN BSN  08/16/2014 9:02 AM

## 2014-08-25 ENCOUNTER — Other Ambulatory Visit (HOSPITAL_COMMUNITY): Payer: Self-pay | Admitting: Physician Assistant

## 2014-08-25 ENCOUNTER — Ambulatory Visit (HOSPITAL_COMMUNITY)
Admission: RE | Admit: 2014-08-25 | Discharge: 2014-08-25 | Disposition: A | Discharge status: Home / Self Care | Payer: BLUE CROSS/BLUE SHIELD | Source: Ambulatory Visit | Attending: Physician Assistant | Admitting: Physician Assistant

## 2014-08-25 ENCOUNTER — Ambulatory Visit (HOSPITAL_COMMUNITY)
Admission: RE | Admit: 2014-08-25 | Discharge: 2014-08-25 | Disposition: A | Discharge status: Home / Self Care | Payer: BLUE CROSS/BLUE SHIELD | Source: Ambulatory Visit | Attending: Internal Medicine | Admitting: Internal Medicine

## 2014-08-25 DIAGNOSIS — Z7902 Long term (current) use of antithrombotics/antiplatelets: Secondary | ICD-10-CM

## 2014-08-25 DIAGNOSIS — Z7982 Long term (current) use of aspirin: Secondary | ICD-10-CM

## 2014-08-25 DIAGNOSIS — N189 Chronic kidney disease, unspecified: Secondary | ICD-10-CM | POA: Diagnosis present

## 2014-08-25 DIAGNOSIS — A047 Enterocolitis due to Clostridium difficile: Secondary | ICD-10-CM | POA: Diagnosis not present

## 2014-08-25 DIAGNOSIS — K631 Perforation of intestine (nontraumatic): Principal | ICD-10-CM | POA: Diagnosis present

## 2014-08-25 DIAGNOSIS — R52 Pain, unspecified: Secondary | ICD-10-CM

## 2014-08-25 DIAGNOSIS — Z8673 Personal history of transient ischemic attack (TIA), and cerebral infarction without residual deficits: Secondary | ICD-10-CM

## 2014-08-25 DIAGNOSIS — K659 Peritonitis, unspecified: Secondary | ICD-10-CM | POA: Diagnosis present

## 2014-08-25 DIAGNOSIS — I214 Non-ST elevation (NSTEMI) myocardial infarction: Secondary | ICD-10-CM | POA: Diagnosis present

## 2014-08-25 DIAGNOSIS — R109 Unspecified abdominal pain: Secondary | ICD-10-CM | POA: Insufficient documentation

## 2014-08-25 DIAGNOSIS — I252 Old myocardial infarction: Secondary | ICD-10-CM

## 2014-08-25 DIAGNOSIS — Z87891 Personal history of nicotine dependence: Secondary | ICD-10-CM

## 2014-08-25 DIAGNOSIS — Z6837 Body mass index (BMI) 37.0-37.9, adult: Secondary | ICD-10-CM

## 2014-08-25 DIAGNOSIS — Z955 Presence of coronary angioplasty implant and graft: Secondary | ICD-10-CM

## 2014-08-25 DIAGNOSIS — Z79899 Other long term (current) drug therapy: Secondary | ICD-10-CM

## 2014-08-25 DIAGNOSIS — Z7901 Long term (current) use of anticoagulants: Secondary | ICD-10-CM

## 2014-08-25 DIAGNOSIS — Z86718 Personal history of other venous thrombosis and embolism: Secondary | ICD-10-CM

## 2014-08-25 DIAGNOSIS — I2510 Atherosclerotic heart disease of native coronary artery without angina pectoris: Secondary | ICD-10-CM | POA: Diagnosis present

## 2014-08-25 DIAGNOSIS — E785 Hyperlipidemia, unspecified: Secondary | ICD-10-CM | POA: Diagnosis present

## 2014-08-25 DIAGNOSIS — D649 Anemia, unspecified: Secondary | ICD-10-CM | POA: Diagnosis present

## 2014-08-25 DIAGNOSIS — I129 Hypertensive chronic kidney disease with stage 1 through stage 4 chronic kidney disease, or unspecified chronic kidney disease: Secondary | ICD-10-CM | POA: Diagnosis present

## 2014-08-25 DIAGNOSIS — R000 Tachycardia, unspecified: Secondary | ICD-10-CM | POA: Diagnosis present

## 2014-08-25 DIAGNOSIS — R1031 Right lower quadrant pain: Secondary | ICD-10-CM | POA: Diagnosis not present

## 2014-08-25 LAB — URINALYSIS, ROUTINE W REFLEX MICROSCOPIC
GLUCOSE, UA: NEGATIVE mg/dL
Hgb urine dipstick: NEGATIVE
Ketones, ur: 15 mg/dL — AB
Nitrite: NEGATIVE
PH: 5 (ref 5.0–8.0)
Protein, ur: NEGATIVE mg/dL
SPECIFIC GRAVITY, URINE: 1.021 (ref 1.005–1.030)
Urobilinogen, UA: 1 mg/dL (ref 0.0–1.0)

## 2014-08-25 LAB — COMPREHENSIVE METABOLIC PANEL
ALT: 35 U/L (ref 0–53)
AST: 32 U/L (ref 0–37)
Albumin: 3.2 g/dL — ABNORMAL LOW (ref 3.5–5.2)
Alkaline Phosphatase: 78 U/L (ref 39–117)
Anion gap: 7 (ref 5–15)
BUN: 22 mg/dL (ref 6–23)
CALCIUM: 8.9 mg/dL (ref 8.4–10.5)
CO2: 32 mmol/L (ref 19–32)
Chloride: 94 mmol/L — ABNORMAL LOW (ref 96–112)
Creatinine, Ser: 1.88 mg/dL — ABNORMAL HIGH (ref 0.50–1.35)
GFR calc non Af Amer: 40 mL/min — ABNORMAL LOW (ref >90)
GFR, EST AFRICAN AMERICAN: 47 mL/min — AB (ref >90)
Glucose, Bld: 127 mg/dL — ABNORMAL HIGH (ref 70–99)
Potassium: 3.7 mmol/L (ref 3.5–5.1)
Sodium: 133 mmol/L — ABNORMAL LOW (ref 135–145)
TOTAL PROTEIN: 6.6 g/dL (ref 6.0–8.3)
Total Bilirubin: 1.1 mg/dL (ref 0.3–1.2)

## 2014-08-25 LAB — URINE MICROSCOPIC-ADD ON

## 2014-08-25 LAB — CBC
HCT: 38 % — ABNORMAL LOW (ref 39.0–52.0)
HEMOGLOBIN: 12.4 g/dL — AB (ref 13.0–17.0)
MCH: 28.6 pg (ref 26.0–34.0)
MCHC: 32.6 g/dL (ref 30.0–36.0)
MCV: 87.6 fL (ref 78.0–100.0)
PLATELETS: 290 10*3/uL (ref 150–400)
RBC: 4.34 MIL/uL (ref 4.22–5.81)
RDW: 14.2 % (ref 11.5–15.5)
WBC: 9 10*3/uL (ref 4.0–10.5)

## 2014-08-26 ENCOUNTER — Inpatient Hospital Stay (HOSPITAL_COMMUNITY)
Admission: EM | Admit: 2014-08-26 | Discharge: 2014-09-02 | DRG: 329 | Disposition: A | Discharge status: Home / Self Care | Payer: BLUE CROSS/BLUE SHIELD | Attending: Internal Medicine | Admitting: Internal Medicine

## 2014-08-26 ENCOUNTER — Encounter (HOSPITAL_COMMUNITY): Payer: Self-pay | Admitting: Nurse Practitioner

## 2014-08-26 ENCOUNTER — Emergency Department (HOSPITAL_COMMUNITY): Payer: BLUE CROSS/BLUE SHIELD

## 2014-08-26 DIAGNOSIS — Z7982 Long term (current) use of aspirin: Secondary | ICD-10-CM | POA: Diagnosis not present

## 2014-08-26 DIAGNOSIS — Z87891 Personal history of nicotine dependence: Secondary | ICD-10-CM | POA: Diagnosis not present

## 2014-08-26 DIAGNOSIS — Z86718 Personal history of other venous thrombosis and embolism: Secondary | ICD-10-CM | POA: Diagnosis not present

## 2014-08-26 DIAGNOSIS — I81 Portal vein thrombosis: Secondary | ICD-10-CM

## 2014-08-26 DIAGNOSIS — K631 Perforation of intestine (nontraumatic): Secondary | ICD-10-CM | POA: Diagnosis present

## 2014-08-26 DIAGNOSIS — E871 Hypo-osmolality and hyponatremia: Secondary | ICD-10-CM

## 2014-08-26 DIAGNOSIS — K659 Peritonitis, unspecified: Secondary | ICD-10-CM | POA: Diagnosis present

## 2014-08-26 DIAGNOSIS — I252 Old myocardial infarction: Secondary | ICD-10-CM | POA: Diagnosis not present

## 2014-08-26 DIAGNOSIS — N189 Chronic kidney disease, unspecified: Secondary | ICD-10-CM | POA: Diagnosis present

## 2014-08-26 DIAGNOSIS — I8290 Acute embolism and thrombosis of unspecified vein: Secondary | ICD-10-CM | POA: Diagnosis present

## 2014-08-26 DIAGNOSIS — Z79899 Other long term (current) drug therapy: Secondary | ICD-10-CM | POA: Diagnosis not present

## 2014-08-26 DIAGNOSIS — Z7901 Long term (current) use of anticoagulants: Secondary | ICD-10-CM | POA: Diagnosis not present

## 2014-08-26 DIAGNOSIS — A047 Enterocolitis due to Clostridium difficile: Secondary | ICD-10-CM | POA: Diagnosis not present

## 2014-08-26 DIAGNOSIS — R000 Tachycardia, unspecified: Secondary | ICD-10-CM | POA: Diagnosis present

## 2014-08-26 DIAGNOSIS — Z8673 Personal history of transient ischemic attack (TIA), and cerebral infarction without residual deficits: Secondary | ICD-10-CM | POA: Diagnosis not present

## 2014-08-26 DIAGNOSIS — Z6837 Body mass index (BMI) 37.0-37.9, adult: Secondary | ICD-10-CM | POA: Diagnosis not present

## 2014-08-26 DIAGNOSIS — R1031 Right lower quadrant pain: Secondary | ICD-10-CM | POA: Diagnosis present

## 2014-08-26 DIAGNOSIS — I495 Sick sinus syndrome: Secondary | ICD-10-CM

## 2014-08-26 DIAGNOSIS — K559 Vascular disorder of intestine, unspecified: Secondary | ICD-10-CM

## 2014-08-26 DIAGNOSIS — I129 Hypertensive chronic kidney disease with stage 1 through stage 4 chronic kidney disease, or unspecified chronic kidney disease: Secondary | ICD-10-CM | POA: Diagnosis present

## 2014-08-26 DIAGNOSIS — R109 Unspecified abdominal pain: Secondary | ICD-10-CM

## 2014-08-26 DIAGNOSIS — E785 Hyperlipidemia, unspecified: Secondary | ICD-10-CM | POA: Diagnosis present

## 2014-08-26 DIAGNOSIS — K55069 Acute infarction of intestine, part and extent unspecified: Secondary | ICD-10-CM | POA: Diagnosis present

## 2014-08-26 DIAGNOSIS — D649 Anemia, unspecified: Secondary | ICD-10-CM | POA: Diagnosis present

## 2014-08-26 DIAGNOSIS — T82867A Thrombosis of cardiac prosthetic devices, implants and grafts, initial encounter: Secondary | ICD-10-CM | POA: Diagnosis present

## 2014-08-26 DIAGNOSIS — I2510 Atherosclerotic heart disease of native coronary artery without angina pectoris: Secondary | ICD-10-CM | POA: Diagnosis present

## 2014-08-26 DIAGNOSIS — I214 Non-ST elevation (NSTEMI) myocardial infarction: Secondary | ICD-10-CM | POA: Diagnosis present

## 2014-08-26 DIAGNOSIS — I10 Essential (primary) hypertension: Secondary | ICD-10-CM | POA: Diagnosis present

## 2014-08-26 DIAGNOSIS — Z955 Presence of coronary angioplasty implant and graft: Secondary | ICD-10-CM | POA: Diagnosis not present

## 2014-08-26 DIAGNOSIS — Z01810 Encounter for preprocedural cardiovascular examination: Secondary | ICD-10-CM

## 2014-08-26 DIAGNOSIS — Z7902 Long term (current) use of antithrombotics/antiplatelets: Secondary | ICD-10-CM | POA: Diagnosis not present

## 2014-08-26 HISTORY — DX: Acute infarction of intestine, part and extent unspecified: K55.069

## 2014-08-26 HISTORY — DX: Atherosclerotic heart disease of native coronary artery without angina pectoris: I25.10

## 2014-08-26 HISTORY — DX: Portal vein thrombosis: I81

## 2014-08-26 HISTORY — DX: Thrombosis due to cardiac prosthetic devices, implants and grafts, initial encounter: T82.867A

## 2014-08-26 HISTORY — DX: Essential (primary) hypertension: I10

## 2014-08-26 HISTORY — DX: Hyperlipidemia, unspecified: E78.5

## 2014-08-26 HISTORY — DX: Coronary angioplasty status: Z98.61

## 2014-08-26 LAB — CBC WITH DIFFERENTIAL/PLATELET
BASOS PCT: 0 % (ref 0–1)
Basophils Absolute: 0 10*3/uL (ref 0.0–0.1)
EOS ABS: 0 10*3/uL (ref 0.0–0.7)
EOS PCT: 0 % (ref 0–5)
HEMATOCRIT: 35.6 % — AB (ref 39.0–52.0)
Hemoglobin: 11.7 g/dL — ABNORMAL LOW (ref 13.0–17.0)
LYMPHS ABS: 2.7 10*3/uL (ref 0.7–4.0)
LYMPHS PCT: 29 % (ref 12–46)
MCH: 28.7 pg (ref 26.0–34.0)
MCHC: 32.9 g/dL (ref 30.0–36.0)
MCV: 87.3 fL (ref 78.0–100.0)
Monocytes Absolute: 1 10*3/uL (ref 0.1–1.0)
Monocytes Relative: 11 % (ref 3–12)
Neutro Abs: 5.7 10*3/uL (ref 1.7–7.7)
Neutrophils Relative %: 60 % (ref 43–77)
PLATELETS: 277 10*3/uL (ref 150–400)
RBC: 4.08 MIL/uL — ABNORMAL LOW (ref 4.22–5.81)
RDW: 14.3 % (ref 11.5–15.5)
WBC: 9.3 10*3/uL (ref 4.0–10.5)

## 2014-08-26 LAB — COMPREHENSIVE METABOLIC PANEL
ALT: 28 U/L (ref 0–53)
AST: 26 U/L (ref 0–37)
Albumin: 3 g/dL — ABNORMAL LOW (ref 3.5–5.2)
Alkaline Phosphatase: 70 U/L (ref 39–117)
Anion gap: 10 (ref 5–15)
BUN: 27 mg/dL — ABNORMAL HIGH (ref 6–23)
CALCIUM: 8.7 mg/dL (ref 8.4–10.5)
CHLORIDE: 89 mmol/L — AB (ref 96–112)
CO2: 29 mmol/L (ref 19–32)
CREATININE: 1.75 mg/dL — AB (ref 0.50–1.35)
GFR calc Af Amer: 51 mL/min — ABNORMAL LOW (ref >90)
GFR calc non Af Amer: 44 mL/min — ABNORMAL LOW (ref >90)
GLUCOSE: 136 mg/dL — AB (ref 70–99)
Potassium: 4 mmol/L (ref 3.5–5.1)
Sodium: 128 mmol/L — ABNORMAL LOW (ref 135–145)
Total Bilirubin: 1.2 mg/dL (ref 0.3–1.2)
Total Protein: 6.5 g/dL (ref 6.0–8.3)

## 2014-08-26 LAB — URINALYSIS, ROUTINE W REFLEX MICROSCOPIC
BILIRUBIN URINE: NEGATIVE
GLUCOSE, UA: NEGATIVE mg/dL
Hgb urine dipstick: NEGATIVE
Ketones, ur: NEGATIVE mg/dL
LEUKOCYTES UA: NEGATIVE
NITRITE: NEGATIVE
Protein, ur: NEGATIVE mg/dL
SPECIFIC GRAVITY, URINE: 1.014 (ref 1.005–1.030)
Urobilinogen, UA: 0.2 mg/dL (ref 0.0–1.0)
pH: 5 (ref 5.0–8.0)

## 2014-08-26 LAB — TROPONIN I: Troponin I: <0.03 ng/mL (ref <0.031)

## 2014-08-26 LAB — I-STAT CG4 LACTIC ACID, ED: Lactic Acid, Venous: 0.55 mmol/L (ref 0.5–2.0)

## 2014-08-26 LAB — LIPASE, BLOOD: Lipase: 70 U/L — ABNORMAL HIGH (ref 11–59)

## 2014-08-26 LAB — SODIUM, URINE, RANDOM: Sodium, Ur: 38 mmol/L

## 2014-08-26 LAB — PROTIME-INR
INR: 1.33 (ref 0.00–1.49)
Prothrombin Time: 16.7 seconds — ABNORMAL HIGH (ref 11.6–15.2)

## 2014-08-26 MED ORDER — PIPERACILLIN-TAZOBACTAM 3.375 G IVPB
3.3750 g | Freq: Three times a day (TID) | INTRAVENOUS | Status: DC
  Administered 2014-08-26 – 2014-09-02 (×19): 3.375 g via INTRAVENOUS
  Filled 2014-08-26 (×23): qty 50

## 2014-08-26 MED ORDER — HEPARIN (PORCINE) IN NACL 100-0.45 UNIT/ML-% IJ SOLN
2950.0000 [IU]/h | INTRAMUSCULAR | Status: DC
  Administered 2014-08-26: 1800 [IU]/h via INTRAVENOUS
  Administered 2014-08-27: 2400 [IU]/h via INTRAVENOUS
  Administered 2014-08-28: 2950 [IU]/h via INTRAVENOUS
  Filled 2014-08-26 (×4): qty 250

## 2014-08-26 MED ORDER — HYDROMORPHONE HCL 1 MG/ML IJ SOLN
1.0000 mg | INTRAMUSCULAR | Status: DC | PRN
  Administered 2014-08-26 – 2014-08-28 (×16): 1 mg via INTRAVENOUS
  Filled 2014-08-26 (×16): qty 1

## 2014-08-26 MED ORDER — ONDANSETRON HCL 4 MG/2ML IJ SOLN: 4.0000 mg | Freq: Once | INTRAMUSCULAR | Status: DC

## 2014-08-26 MED ORDER — METOPROLOL TARTRATE 1 MG/ML IV SOLN
2.5000 mg | Freq: Three times a day (TID) | INTRAVENOUS | Status: DC
  Administered 2014-08-27 – 2014-08-29 (×9): 2.5 mg via INTRAVENOUS
  Filled 2014-08-26 (×11): qty 5

## 2014-08-26 MED ORDER — PROMETHAZINE HCL 25 MG/ML IJ SOLN
12.5000 mg | INTRAMUSCULAR | Status: DC | PRN
Start: 2014-08-26 — End: 2014-09-02
  Administered 2014-08-27 – 2014-08-29 (×5): 12.5 mg via INTRAVENOUS
  Filled 2014-08-26 (×5): qty 1

## 2014-08-26 MED ORDER — HYDROMORPHONE HCL 1 MG/ML IJ SOLN
1.0000 mg | Freq: Once | INTRAMUSCULAR | Status: AC
  Administered 2014-08-26: 1 mg via INTRAVENOUS
  Filled 2014-08-26: qty 1

## 2014-08-26 MED ORDER — IOHEXOL 300 MG/ML  SOLN: 25.0000 mL | INTRAMUSCULAR | Status: AC

## 2014-08-26 MED ORDER — SODIUM CHLORIDE 0.9 % IV BOLUS (SEPSIS)
1000.0000 mL | Freq: Once | INTRAVENOUS | Status: AC
  Administered 2014-08-26: 1000 mL via INTRAVENOUS

## 2014-08-26 MED ORDER — ASPIRIN 81 MG PO CHEW
81.0000 mg | CHEWABLE_TABLET | Freq: Every day | ORAL | Status: DC
  Administered 2014-08-27 – 2014-09-01 (×6): 81 mg via ORAL
  Filled 2014-08-26 (×8): qty 1

## 2014-08-26 MED ORDER — SODIUM CHLORIDE 0.9 % IV SOLN
2500.0000 mg | Freq: Once | INTRAVENOUS | Status: AC
  Administered 2014-08-26: 2500 mg via INTRAVENOUS
  Filled 2014-08-26: qty 2500

## 2014-08-26 MED ORDER — VANCOMYCIN HCL IN DEXTROSE 1-5 GM/200ML-% IV SOLN
1000.0000 mg | Freq: Two times a day (BID) | INTRAVENOUS | Status: DC
  Filled 2014-08-26: qty 200

## 2014-08-26 MED ORDER — PROMETHAZINE HCL 25 MG/ML IJ SOLN
25.0000 mg | Freq: Once | INTRAMUSCULAR | Status: AC
  Administered 2014-08-26: 25 mg via INTRAVENOUS
  Filled 2014-08-26: qty 1

## 2014-08-26 MED ORDER — ACETAMINOPHEN 325 MG PO TABS
650.0000 mg | ORAL_TABLET | Freq: Once | ORAL | Status: AC
Start: 2014-08-26 — End: 2014-08-26
  Administered 2014-08-26: 650 mg via ORAL
  Filled 2014-08-26: qty 2

## 2014-08-26 MED ORDER — SODIUM CHLORIDE 0.9 % IV SOLN
INTRAVENOUS | Status: DC
  Administered 2014-08-26: 20:00:00 via INTRAVENOUS
  Administered 2014-08-27: 1000 mL via INTRAVENOUS
  Administered 2014-08-28 – 2014-08-29 (×2): via INTRAVENOUS
  Administered 2014-08-29: 75 mL/h via INTRAVENOUS
  Administered 2014-08-31: 17:00:00 via INTRAVENOUS

## 2014-08-26 NOTE — ED Notes (Signed)
Family at bedside. 

## 2014-08-26 NOTE — H&P (Signed)
[facility] History and Physical  Dylan [name] JOI:[id number] DOB: April 29, 1966 DOA: 08/26/2014  Referring physician: EDP PCP: [name], MD   Chief Complaint: right lower quadrant pain since yesterday.   HPI: Dylan [name] is a 49 y.o. male  With prior complicated h/o of SMV and portal vein thrombosis s/p catheter directed thrombolytic therapy, complicated by NSTEMI , found to have thrombosis of the distal RCA stent s/p successful PTCA and angio seal closure, CVA, prior CAD, hyperlipidemia, acute on chronic renal insufficiency, discharged from the hospital on 1/13, comes in for right lower quadrant abdominal pain associated with nausea, and loose bowel movements. He was seen by his PCP yesterday and labs were drawn and sent home and asked to come back if pain worsens. He denies any other complaints. In ED, oral contrast CT abdomen and pelvis revealed bowel perforation with mesenteric LAD. Surgery was consulted by EDP and we were requested to admit the patient. Cardiology consulted pre and peri operative medication management. Pt reports taking meds , including plavix yesterday morning .    Review of Systems:  See hpi for pertinent positive history, the rest of the ROS are negative.   Past Medical History  Diagnosis Date  . Hypertension   . Coronary artery disease   . S/P angioplasty with stent (angiosculpt) of RCA for "in stent restenosis" 07/06/2012  . Hypercholesteremia   . Anginal pain   . NSTEMI (non-ST elevated myocardial infarction) 07/2010    r/t total occlusion of RCA (3 Taxus Ion DES)  . Stroke 2011    S/P cardiac cath - embolic stroke; denies residual (07/06/2012)  . Migraines     "maybe one/yr" (07/06/2012)  . 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 04/19/2013    No evidence of CVA on MRI/MRA & Dliated Eye Examination. ruptured blood vessel & not occlusion.  .  Portal vein thrombosis    Past Surgical History  Procedure Laterality Date  . Septoplasty  2001  . Coronary angioplasty with stent placement  07/2010    inferior wall MI - 3 Taxus Ion DES (3.0x25mm, 3.0x73mm, 3.5x34mm) to prox and mid RCA  . Coronary angioplasty  07/06/2012    in-stent restenotic lesion in RCA 80%   . Cardiac catheterization  04/18/2013  . Left heart catheterization with coronary angiogram N/A 06/11/2012    Procedure: LEFT HEART CATHETERIZATION WITH CORONARY ANGIOGRAM;  Surgeon: [name], MD;  Location: [facility] CATH LAB;  Service: Cardiovascular;  Laterality: N/A;  . Percutaneous coronary stent intervention (pci-s) N/A 07/06/2012    Procedure: PERCUTANEOUS CORONARY STENT INTERVENTION (PCI-S);  Surgeon: [name], MD;  Location: [facility] CATH LAB;  Service: Cardiovascular;  Laterality: N/A;  . Left heart catheterization with coronary angiogram N/A 04/18/2013    Procedure: LEFT HEART CATHETERIZATION WITH CORONARY ANGIOGRAM;  Surgeon: [name], MD;  Location: [facility] CATH LAB;  Service: Cardiovascular;  Laterality: N/A;  . Left heart catheterization with coronary angiogram N/A 08/15/2014    Procedure: LEFT HEART CATHETERIZATION WITH CORONARY ANGIOGRAM;  Surgeon: [name], MD;  Location: [facility] CATH LAB;  Service: Cardiovascular;  Laterality: N/A;   Social History:  reports that he quit smoking about 4 years ago. His smoking use included Cigarettes. He has a 15 pack-year smoking history. He quit smokeless tobacco use about 4 years ago. His smokeless tobacco use included Chew. He reports that he drinks about 16.8 oz  of alcohol per week. He reports that he does not use illicit drugs.  Allergies  Allergen Reactions  . Ace Inhibitors Cough  . Lipitor [Atorvastatin] Other (See Comments)    myalgia  . Zofran [Ondansetron Hcl] Nausea And Vomiting  . Codeine Itching    Family History  Problem Relation Age of Onset  . Atrial fibrillation Mother   . Mitral valve prolapse Mother     . Coronary artery disease Father     CABG, aortic aneursym,    do not leave blank  Prior to Admission medications   Medication Sig Start Date End Date Taking? Authorizing Provider  acetaminophen (TYLENOL) 500 MG tablet Take 1 tablet (500 mg total) by mouth every 4 (four) hours as needed for mild pain, fever or headache. 08/16/14  Yes Cherene Altes, MD  allopurinol (ZYLOPRIM) 300 MG tablet Take 1 tablet by mouth daily. 07/03/14  Yes Historical Provider, MD  aspirin 81 MG chewable tablet Chew 1 tablet (81 mg total) by mouth daily. 07/07/12  Yes Erlene Quan, PA-C  clopidogrel (PLAVIX) 75 MG tablet Take 1 tablet (75 mg total) by mouth daily with breakfast. 08/16/14  Yes Cherene Altes, MD  dabigatran (PRADAXA) 150 MG CAPS capsule Take 1 capsule (150 mg total) by mouth every 12 (twelve) hours. 08/16/14  Yes Cherene Altes, MD  fenofibrate 160 MG tablet Take 1 tablet (160 mg total) by mouth daily. 04/18/13  Yes Brittainy Erie Noe, PA-C  furosemide (LASIX) 20 MG tablet Take 20 mg by mouth daily.   Yes Historical Provider, MD  metoprolol succinate (TOPROL-XL) 50 MG 24 hr tablet Take 50 mg by mouth daily. Take with or immediately following a meal.   Yes Historical Provider, MD  olmesartan-hydrochlorothiazide (BENICAR HCT) 40-25 MG per tablet Take 1 tablet by mouth daily.   Yes Historical Provider, MD  omega-3 acid ethyl esters (LOVAZA) 1 G capsule Take 1 capsule (1 g total) by mouth daily. <please make appointment>   Yes Brittainy Erie Noe, PA-C  oxymorphone (OPANA) 10 MG tablet Take 10 mg by mouth 2 (two) times daily as needed for pain.   Yes Historical Provider, MD  traMADol (ULTRAM) 50 MG tablet Take 1-2 tablets (50-100 mg total) by mouth every 6 (six) hours as needed for moderate pain or severe pain. 08/16/14  Yes Cherene Altes, MD  nitroGLYCERIN (NITROSTAT) 0.4 MG SL tablet Place 1 tablet (0.4 mg total) under the tongue every 5 (five) minutes x 3 doses as needed for chest pain. 07/07/12    Erlene Quan, PA-C  oxymorphone (OPANA) 10 MG tablet Take 10 mg by mouth 2 (two) times daily. 08/20/14   Historical Provider, MD   Physical Exam: Filed Vitals:   08/26/14 1454 08/26/14 1716 08/26/14 1823 08/26/14 1908  BP: 116/65 100/66 102/56   Pulse: 94 99 94   Temp: 98.8 F (37.1 C)   99.3 F (37.4 C)  TempSrc: Oral   Oral  Resp: 17 16 14    SpO2: 99% 96% 96%     Wt Readings from Last 3 Encounters:  08/16/14 131.7 kg (290 lb 5.5 oz)  04/19/13 129.4 kg (285 lb 4.4 oz)  07/07/12 120.6 kg (265 lb 14 oz)    General:  Appears calm and comfortable Eyes: PERRL, normal lids, irises & conjunctiva Neck: no LAD, masses or thyromegaly Cardiovascular: RRR, no m/r/g. No LE edema. Telemetry: SR, no arrhythmias  Respiratory: CTA bilaterally, no w/r/r. Normal respiratory effort. Abdomen: soft, moderate tenderness in the  right lower quadrant.  Skin: no rash or induration seen on limited exam Musculoskeletal: grossly normal tone BUE/BLE Psychiatric: grossly normal mood and affect, speech fluent and appropriate Neurologic: grossly non-focal.          Labs on Admission:  Basic Metabolic Panel:  Recent Labs Lab 08/25/14 1722 08/26/14 1248  NA 133* 128*  K 3.7 4.0  CL 94* 89*  CO2 32 29  GLUCOSE 127* 136*  BUN 22 27*  CREATININE 1.88* 1.75*  CALCIUM 8.9 8.7   Liver Function Tests:  Recent Labs Lab 08/25/14 1722 08/26/14 1248  AST 32 26  ALT 35 28  ALKPHOS 78 70  BILITOT 1.1 1.2  PROT 6.6 6.5  ALBUMIN 3.2* 3.0*    Recent Labs Lab 08/26/14 1248  LIPASE 70*   No results for input(s): AMMONIA in the last 168 hours. CBC:  Recent Labs Lab 08/25/14 1722 08/26/14 1248  WBC 9.0 9.3  NEUTROABS  --  5.7  HGB 12.4* 11.7*  HCT 38.0* 35.6*  MCV 87.6 87.3  PLT 290 277   Cardiac Enzymes:  Recent Labs Lab 08/26/14 1353  TROPONINI <0.03    BNP (last 3 results) No results for input(s): PROBNP in the last 8760 hours. CBG: No results for input(s): GLUCAP in the  last 168 hours.  Radiological Exams on Admission: Ct Abdomen Pelvis Wo Contrast  08/26/2014   CLINICAL DATA:  Right upper quadrant pain. Nausea, vomiting and diarrhea. Pain for 2 days. Pain has been increasing.  EXAM: CT ABDOMEN AND PELVIS WITHOUT CONTRAST  TECHNIQUE: Multidetector CT imaging of the abdomen and pelvis was performed following the standard protocol without IV contrast.  COMPARISON:  CT, 08/07/2014  FINDINGS: There are significant inflammatory changes in the right lower quadrant surrounding the distal ileum and cecum. The appendix is not definitively seen. It may be hidden within free fluid in the right lower quadrant. Small bowel loops show no wall thickening and there are associated prominent mesenteric lymph nodes. A small amount of free fluid is associated with the inflammatory change in the right lower quadrant. Along 1 loop of the thick-walled, right lower quadrant small bowel there is extraluminal air. There is minimal extraluminal contrast along an area of apparent wall dehiscence.  Small right pleural effusion. There is dependent subsegmental atelectasis in the lower lobes, right greater than left. Heart is normal in size.  Dense material lies in the right liver lobe, new from the prior exam, reflecting the replaced endovascular coils. No other liver abnormality.  Spleen, gallbladder, pancreas, adrenal glands:  Normal.  2.9 cm low-density left renal mass with an associated calcification, unchanged. Is protrudes from the posterior midpole. No other renal abnormality. Normal ureters. Bladder is unremarkable.  Remainder the bowel is unremarkable.  No free air. Hernia mesh lies along the anterior lower abdominal wall.  Degenerative changes noted of the visualized spine. No osteoblastic or osteolytic lesions.  IMPRESSION: 1. There are significant inflammatory in the right lower quadrant centered on thick-walled loops of small bowel. One affected loop has a dehiscent wall where there is a small  amount extraluminal contrast and air. A small amount of fluid is associated with the inflammatory right lower quadrant changes. Given recent endovascular procedures, the possibility of the abnormal small bowel loops being due to ischemia should be considered. The etiology could be infectious or inflammatory. 2. Appendix not visualized. Findings appear to be due to small bowel and not from a ruptured appendicitis.   Electronically Signed   By: Shanon Brow  Ormond M.D.   On: 08/26/2014 17:20   Dg Abd 1 View  08/25/2014   CLINICAL DATA:  Abdominal pain for 3 weeks.  EXAM: ABDOMEN - 1 VIEW  COMPARISON:  08/12/2014  FINDINGS: There are a few dilated loops of small bowel within the left upper quadrant of the abdomen which measure up to 3.9 cm. When compared with the previous exam the degree of bowel distention is significantly improved. Hernia mesh overlies the pelvis.  IMPRESSION: 1. A few persistent dilated loops of small bowel in the left upper quadrant.   Electronically Signed   By: Kerby Moors M.D.   On: 08/25/2014 18:25    EKG: sinus tachy with PAC'S  Assessment/Plan Active Problems:   Intestinal perforation   Bowel perforation: Admitted to step down, started him gentle hydration,. NPO. Further management as per surgery. Pain control and anti emetics.   CAD, s/p PTCA: Less than 10 days ago. He received 10 days of plavix and aspirin.  Will defer to cardiology to start anticoagulation and anti platelet medications.  Bb 2.5 mg every 8 hours as needed.   We will holding all non emergent medications.      Code Status:  Full code.  DVT Prophylaxis: Family Communication: wife at bedside.  Disposition Plan: admit to step down.   Time spent: 55 min  [facility] Pager [phone]

## 2014-08-26 NOTE — ED Notes (Signed)
Pt reports clot in portal vein in January 1st, had thrombectomy, reports pain in area of surgery now. sts has had decreased appetite and smaller bowel movements than normal, reports urine is dark and was told that he had abnormal renal function test.

## 2014-08-26 NOTE — Progress Notes (Addendum)
ANTICOAGULATION CONSULT NOTE - Initial Consult  Pharmacy Consult for heparin Indication: SMV and portal vein thrombosis (pradaxa on hold)  Allergies  Allergen Reactions  . Ace Inhibitors Cough  . Lipitor [Atorvastatin] Other (See Comments)    myalgia  . Zofran [Ondansetron Hcl] Nausea And Vomiting  . Codeine Itching    Patient Measurements: Height: 6\' 2"  (188 cm) Weight: 267 lb 10.2 oz (121.4 kg) IBW/kg (Calculated) : 82.2 Heparin Dosing Weight: 110kg  Vital Signs: Temp: 100.9 F (38.3 C) (01/23 2055) Temp Source: Oral (01/23 2055) BP: 88/55 mmHg (01/23 2100) Pulse Rate: 104 (01/23 2100)  Labs:  Recent Labs  08/25/14 1722 08/26/14 1248 08/26/14 1353 08/26/14 1835  HGB 12.4* 11.7*  --   --   HCT 38.0* 35.6*  --   --   PLT 290 277  --   --   LABPROT  --   --   --  16.7*  INR  --   --   --  1.33  CREATININE 1.88* 1.75*  --   --   TROPONINI  --   --  <0.03  --     Estimated Creatinine Clearance: 70.7 mL/min (by C-G formula based on Cr of 1.75).   Medical History: Past Medical History  Diagnosis Date  . Hypertension   . Coronary artery disease   . S/P angioplasty with stent (angiosculpt) of RCA for "in stent restenosis" 07/06/2012  . Hypercholesteremia   . Anginal pain   . NSTEMI (non-ST elevated myocardial infarction) 07/2010    r/t total occlusion of RCA (3 Taxus Ion DES)  . Stroke 2011    S/P cardiac cath - embolic stroke; denies residual (07/06/2012)  . Migraines     "maybe one/yr" (07/06/2012)  . 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 04/19/2013    No evidence of CVA on MRI/MRA & Dliated Eye Examination. ruptured blood vessel & not occlusion.  . Portal vein thrombosis     Medications:  Pradaxa -last dose 1/22 @0900   Assessment: 49 year old male known to pharmacy service for previous anticoagulation dosing earlier this month for portal vein thrombosis.  Patient was discharged on pradaxa. Do notice a weight loss of about ~15kg since last admit. Patient was previously therapeutic on 2300 units/hr, will start at slightly lower dose and titrate up.   Hgb 11.7, INR 1.3   Goal of Therapy:  Heparin level 0.3-0.7 units/ml Monitor platelets by anticoagulation protocol: Yes   Plan:  Start heparin infusion at 1800 units/hr-no bolus per MD Check anti-Xa level in 6 hours and daily while on heparin Continue to monitor H&H and platelets  Erin Hearing PharmD., BCPS Clinical Pharmacist Pager [phone] 08/26/2014 9:17 PM  Vancomycin and zosyn ordered for possible intra-abdominal infection.  Tmax 102.7 Wbc 9.3 Scr 1.75  Plan Zosyn 3.375g IV q8 hours Vancomycin 2.5g IV x1 now then 1g IV q12 hours - check trough at [facility]  08/26/2014 9:27 PM

## 2014-08-26 NOTE — ED Notes (Signed)
Cardiology at BS

## 2014-08-26 NOTE — ED Provider Notes (Signed)
6:01 PM patient found to have perforation on CT. I consulted general surgery. Will admit to the hospitalist d/t multiple comorbid conditions w/ GSU following.   CRITICAL CARE Performed by: Pamella Pert, S Total critical care time: 30 min Critical care time was exclusive of separately billable procedures and treating other patients. Critical care was necessary to treat or prevent imminent or life-threatening deterioration. Critical care was time spent personally by me on the following activities: development of treatment plan with patient and/or surrogate as well as nursing, discussions with consultants, evaluation of patient's response to treatment, examination of patient, obtaining history from patient or surrogate, ordering and performing treatments and interventions, ordering and review of laboratory studies, ordering and review of radiographic studies, pulse oximetry and re-evaluation of patient's condition.   Clinical Impression 1. Abdominal pain in male   2. Perforation bowel   3. Hyponatremia      Pamella Pert, MD 08/26/14 [phone]

## 2014-08-26 NOTE — Consult Note (Signed)
Reason for Consult:abd pain Referring Physician: Dr Pamella Pert  Dylan [name] is an 49 y.o. male.  HPI: 26 yom with complicated history who presented 1/1 with abdominal pain and was diagnosed with small bowel enteritis. This was associated with proximal smv and portal vein thrombus. This did not improve apparently with anticoagulation and he underwent thrombolysis by IR.  This improved and then closer to discharge he had stemi with instent rca thrombosis.  He was treated with angioplasty and then discharged home on pradaxa, asa and plavix.  He took all of these yesterday am. He now has 3 days of worsening abdominal pain. Has not been eating due to pain. subj fever at home. He is having bms without melena or brbpr.  Pain was worsening and led him to come in.  Past Medical History  Diagnosis Date  . Hypertension   . Coronary artery disease   . S/P angioplasty with stent (angiosculpt) of RCA for "in stent restenosis" 07/06/2012  . Hypercholesteremia   . Anginal pain   . NSTEMI (non-ST elevated myocardial infarction) 07/2010    r/t total occlusion of RCA (3 Taxus Ion DES)  . Stroke 2011    S/P cardiac cath - embolic stroke; denies residual (07/06/2012)  . Migraines     "maybe one/yr" (07/06/2012)  . 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 04/19/2013    No evidence of CVA on MRI/MRA & Dliated Eye Examination. ruptured blood vessel & not occlusion.  . Portal vein thrombosis     Past Surgical History  Procedure Laterality Date  . Septoplasty  2001  . Coronary angioplasty with stent placement  07/2010    inferior wall MI - 3 Taxus Ion DES (3.0x55m, 3.0x260m 3.5x1277mto prox and mid RCA  . Coronary angioplasty  07/06/2012    in-stent restenotic lesion in RCA 80%   . Cardiac catheterization  04/18/2013  . Left heart catheterization with coronary angiogram N/A 06/11/2012    Procedure: LEFT  HEART CATHETERIZATION WITH CORONARY ANGIOGRAM;  Surgeon: MihSanda KleinD;  [facility];  Service: Cardiovascular;  Laterality: N/A;  . Percutaneous coronary stent intervention (pci-s) N/A 07/06/2012    Procedure: PERCUTANEOUS CORONARY STENT INTERVENTION (PCI-S);  Surgeon: JonLorretta HarpD;  [facility];  Service: Cardiovascular;  Laterality: N/A;  . Left heart catheterization with coronary angiogram N/A 04/18/2013    Procedure: LEFT HEART CATHETERIZATION WITH CORONARY ANGIOGRAM;  Surgeon: ThoTroy SineD;  [facility];  Service: Cardiovascular;  Laterality: N/A;  . Left heart catheterization with coronary angiogram N/A 08/15/2014    Procedure: LEFT HEART CATHETERIZATION WITH CORONARY ANGIOGRAM;  Surgeon: DavLeonie ManD;  [facility];  Service: Cardiovascular;  Laterality: N/A;    Family History  Problem Relation Age of Onset  . Atrial fibrillation Mother   . Mitral valve prolapse Mother   . Coronary artery disease Father     CABG, aortic aneursym,    Social History:  reports that he quit smoking about 4 years ago. His smoking use included Cigarettes. He has a 15 pack-year smoking history. He quit smokeless tobacco use about 4 years ago. His smokeless tobacco use included Chew. He reports that he drinks about 16.8 oz of alcohol per week. He reports that he does not use illicit drugs.  Allergies:  Allergies  Allergen Reactions  . Ace Inhibitors Cough  .  Lipitor [Atorvastatin] Other (See Comments)    myalgia  . Zofran [Ondansetron Hcl] Nausea And Vomiting  . Codeine Itching    Medications: I have reviewed the patient's current medications.  Results for orders placed or performed during the hospital encounter of 08/26/14 (from the past 48 hour(s))  CBC with Differential     Status: Abnormal   Collection Time: 08/26/14 12:48 PM  Result Value Ref Range   WBC 9.3 4.0 - 10.5 K/uL   RBC 4.08 (L) 4.22 - 5.81 MIL/uL   Hemoglobin 11.7 (L)  13.0 - 17.0 g/dL   HCT 35.6 (L) 39.0 - 52.0 %   MCV 87.3 78.0 - 100.0 fL   MCH 28.7 26.0 - 34.0 pg   MCHC 32.9 30.0 - 36.0 g/dL   RDW 14.3 11.5 - 15.5 %   Platelets 277 150 - 400 K/uL   Neutrophils Relative % 60 43 - 77 %   Neutro Abs 5.7 1.7 - 7.7 K/uL   Lymphocytes Relative 29 12 - 46 %   Lymphs Abs 2.7 0.7 - 4.0 K/uL   Monocytes Relative 11 3 - 12 %   Monocytes Absolute 1.0 0.1 - 1.0 K/uL   Eosinophils Relative 0 0 - 5 %   Eosinophils Absolute 0.0 0.0 - 0.7 K/uL   Basophils Relative 0 0 - 1 %   Basophils Absolute 0.0 0.0 - 0.1 K/uL  Comprehensive metabolic panel     Status: Abnormal   Collection Time: 08/26/14 12:48 PM  Result Value Ref Range   Sodium 128 (L) 135 - 145 mmol/L   Potassium 4.0 3.5 - 5.1 mmol/L   Chloride 89 (L) 96 - 112 mmol/L   CO2 29 19 - 32 mmol/L   Glucose, Bld 136 (H) 70 - 99 mg/dL   BUN 27 (H) 6 - 23 mg/dL   Creatinine, Ser 1.75 (H) 0.50 - 1.35 mg/dL   Calcium 8.7 8.4 - 10.5 mg/dL   Total Protein 6.5 6.0 - 8.3 g/dL   Albumin 3.0 (L) 3.5 - 5.2 g/dL   AST 26 0 - 37 U/L   ALT 28 0 - 53 U/L   Alkaline Phosphatase 70 39 - 117 U/L   Total Bilirubin 1.2 0.3 - 1.2 mg/dL   GFR calc non Af Amer 44 (L) >90 mL/min   GFR calc Af Amer 51 (L) >90 mL/min    Comment: (NOTE) The eGFR has been calculated using the CKD EPI equation. This calculation has not been validated in all clinical situations. eGFR's persistently <90 mL/min signify possible Chronic Kidney Disease.    Anion gap 10 5 - 15  Lipase, blood     Status: Abnormal   Collection Time: 08/26/14 12:48 PM  Result Value Ref Range   Lipase 70 (H) 11 - 59 U/L  Troponin I     Status: None   Collection Time: 08/26/14  1:53 PM  Result Value Ref Range   Troponin I <0.03 <0.031 ng/mL    Comment:        NO INDICATION OF MYOCARDIAL INJURY.   Urinalysis, Routine w reflex microscopic     Status: Abnormal   Collection Time: 08/26/14  2:55 PM  Result Value Ref Range   Color, Urine AMBER (A) YELLOW    Comment:  BIOCHEMICALS MAY BE AFFECTED BY COLOR   APPearance CLEAR CLEAR   Specific Gravity, Urine 1.014 1.005 - 1.030   pH 5.0 5.0 - 8.0   Glucose, UA NEGATIVE NEGATIVE mg/dL   Hgb urine dipstick  NEGATIVE NEGATIVE   Bilirubin Urine NEGATIVE NEGATIVE   Ketones, ur NEGATIVE NEGATIVE mg/dL   Protein, ur NEGATIVE NEGATIVE mg/dL   Urobilinogen, UA 0.2 0.0 - 1.0 mg/dL   Nitrite NEGATIVE NEGATIVE   Leukocytes, UA NEGATIVE NEGATIVE    Comment: MICROSCOPIC NOT DONE ON URINES WITH NEGATIVE PROTEIN, BLOOD, LEUKOCYTES, NITRITE, OR GLUCOSE <1000 mg/dL.  Sodium, urine, random     Status: None   Collection Time: 08/26/14  2:55 PM  Result Value Ref Range   Sodium, Ur 38 mmol/L  Protime-INR     Status: Abnormal   Collection Time: 08/26/14  6:35 PM  Result Value Ref Range   Prothrombin Time 16.7 (H) 11.6 - 15.2 seconds   INR 1.33 0.00 - 1.49  I-Stat CG4 Lactic Acid, ED     Status: None   Collection Time: 08/26/14  6:49 PM  Result Value Ref Range   Lactic Acid, Venous 0.55 0.5 - 2.0 mmol/L    Ct Abdomen Pelvis Wo Contrast  08/26/2014   CLINICAL DATA:  Right upper quadrant pain. Nausea, vomiting and diarrhea. Pain for 2 days. Pain has been increasing.  EXAM: CT ABDOMEN AND PELVIS WITHOUT CONTRAST  TECHNIQUE: Multidetector CT imaging of the abdomen and pelvis was performed following the standard protocol without IV contrast.  COMPARISON:  CT, 08/07/2014  FINDINGS: There are significant inflammatory changes in the right lower quadrant surrounding the distal ileum and cecum. The appendix is not definitively seen. It may be hidden within free fluid in the right lower quadrant. Small bowel loops show no wall thickening and there are associated prominent mesenteric lymph nodes. A small amount of free fluid is associated with the inflammatory change in the right lower quadrant. Along 1 loop of the thick-walled, right lower quadrant small bowel there is extraluminal air. There is minimal extraluminal contrast along an area  of apparent wall dehiscence.  Small right pleural effusion. There is dependent subsegmental atelectasis in the lower lobes, right greater than left. Heart is normal in size.  Dense material lies in the right liver lobe, new from the prior exam, reflecting the replaced endovascular coils. No other liver abnormality.  Spleen, gallbladder, pancreas, adrenal glands:  Normal.  2.9 cm low-density left renal mass with an associated calcification, unchanged. Is protrudes from the posterior midpole. No other renal abnormality. Normal ureters. Bladder is unremarkable.  Remainder the bowel is unremarkable.  No free air. Hernia mesh lies along the anterior lower abdominal wall.  Degenerative changes noted of the visualized spine. No osteoblastic or osteolytic lesions.  IMPRESSION: 1. There are significant inflammatory in the right lower quadrant centered on thick-walled loops of small bowel. One affected loop has a dehiscent wall where there is a small amount extraluminal contrast and air. A small amount of fluid is associated with the inflammatory right lower quadrant changes. Given recent endovascular procedures, the possibility of the abnormal small bowel loops being due to ischemia should be considered. The etiology could be infectious or inflammatory. 2. Appendix not visualized. Findings appear to be due to small bowel and not from a ruptured appendicitis.   Electronically Signed   By: David  Ormond M.D.   On: 08/26/2014 17:20   Dg Abd 1 View  08/25/2014   CLINICAL DATA:  Abdominal pain for 3 weeks.  EXAM: ABDOMEN - 1 VIEW  COMPARISON:  08/12/2014  FINDINGS: There are a few dilated loops of small bowel within the left upper quadrant of the abdomen which measure up to 3.9 cm. When compared   with the previous exam the degree of bowel distention is significantly improved. Hernia mesh overlies the pelvis.  IMPRESSION: 1. A few persistent dilated loops of small bowel in the left upper quadrant.   Electronically Signed   By:  Taylor  Stroud M.D.   On: 08/25/2014 18:25    Review of Systems  Constitutional: Positive for fever.  Respiratory: Negative for shortness of breath.   Cardiovascular: Negative for chest pain.  Gastrointestinal: Positive for abdominal pain. Negative for nausea, vomiting, diarrhea, blood in stool and melena.   Blood pressure 102/56, pulse 104, temperature 99.3 F (37.4 C), temperature source Oral, resp. rate 14, SpO2 94 %. Physical Exam  Constitutional: He is oriented to person, place, and time. He appears well-developed and well-nourished.  HENT:  Head: Normocephalic and atraumatic.  Eyes: No scleral icterus.  Cardiovascular: Normal rate, regular rhythm and normal heart sounds.   Respiratory: Effort normal and breath sounds normal. He has no wheezes. He has no rales.  GI: Soft. He exhibits no distension. There is tenderness (localized rlq tenderness with localized peritonitis). There is no rebound and no guarding.  Neurological: He is alert and oriented to person, place, and time.    Assessment/Plan: Small bowel enteritis with localized likely contained perforation  He is not systemically ill. He clearly has perforation that I think is contained. There is small amount extraluminal contrast associated with some air near some fluid. Some of this fluid was present on the first although it does appear more complex now.  I think he will likely need surgery but this is certainly complicated by other issues. He has taken all his anticoagulants. We certainly could give pradaxa antibody but I am concerned with bleeding at time of surgery and he does not have urgent indication right now. I am also concerned with recent stemi and likelihood of this happening again and needing emergent cath. I have asked cardiology to see him and he can be started on iv heparin or iv antiplatelet inhibitor if they think needs to be.  These would be able to be turned off more quickly for surgery. Will place on abx,  monitor in stepdown, if he worsens he and his wife understand he will need surgery tonight but some time to allow antiplatelet agents and pradaxa to wear off would be helpful.    WAKEFIELD,MATTHEW 08/26/2014, 7:56 PM      

## 2014-08-26 NOTE — Consult Note (Signed)
Referring Physician: No referring provider defined for this encounter. Primary Physician: [name], MD Primary Cardiologist: Ellyn Hack MD Reason for Consultation: peri-operative antiplatelet medication management and Cardiac risk assessment prior to intra-abdominal surgery  HPI:  Dylan [name] is a 49 y.o. male with history of inferior NSTEMI status post PCI to the mid RCA followed by redo PCI more proximally in the RCA in 2013 with treatment of proximal in-stent restenosis with a new stent proximally. He has been lost to follow-up for cardiology and has not been taking Plavix for over a year. He was admitted on January 1st 2016 for acute onset abdominal pain and was found to have a thrombosed superior mesenteric vein with extension into the portal vein. This was thought to be related to testosterone replacement. Treated with thrombolytic lysis VIR. Following this the patient developed acute onset of substernal chest pressure on the evening of the 10th/morning left of the 11th. He was diagnosed with NSTEMI. His cardiac catheterization was notable for RCA in-stent thrombosis with up to 70% luminal narrowing. He underwent PTCA with minimal amount of residual thrombus. Now pt is being admitted to the hospital with RUQ abdominal pain secondary to small bowel perforation.   Patient denied active symptoms of chest pain, shortness of breath, syncope or presyncope, lower extremity edema, PND or orthopnea, frequent or prolonged palpitations. Patient is able to ambulate without any difficulties and is able to perform more than 4 METs  Review of Systems:  12 systems were reviewed nad were negative except mentioned in the HPI      Past Medical History  Diagnosis Date  . Hypertension   . Coronary artery disease   . S/P angioplasty with stent (angiosculpt) of RCA for "in stent restenosis" 07/06/2012  . Hypercholesteremia   . Anginal pain   . NSTEMI (non-ST elevated myocardial infarction)  07/2010    r/t total occlusion of RCA (3 Taxus Ion DES)  . Stroke 2011    S/P cardiac cath - embolic stroke; denies residual (07/06/2012)  . Migraines     "maybe one/yr" (07/06/2012)  . 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 04/19/2013    No evidence of CVA on MRI/MRA & Dliated Eye Examination. ruptured blood vessel & not occlusion.  . Portal vein thrombosis    Past Surgical History  Procedure Laterality Date  . Septoplasty  2001  . Coronary angioplasty with stent placement  07/2010    inferior wall MI - 3 Taxus Ion DES (3.0x66mm, 3.0x58mm, 3.5x26mm) to prox and mid RCA  . Coronary angioplasty  07/06/2012    in-stent restenotic lesion in RCA 80%   . Cardiac catheterization  04/18/2013  . Left heart catheterization with coronary angiogram N/A 06/11/2012    Procedure: LEFT HEART CATHETERIZATION WITH CORONARY ANGIOGRAM;  Surgeon: [name], MD;  Location: [place] CATH LAB;  Service: Cardiovascular;  Laterality: N/A;  . Percutaneous coronary stent intervention (pci-s) N/A 07/06/2012    Procedure: PERCUTANEOUS CORONARY STENT INTERVENTION (PCI-S);  Surgeon: [name], MD;  Location: [facility] CATH LAB;  Service: Cardiovascular;  Laterality: N/A;  . Left heart catheterization with coronary angiogram N/A 04/18/2013    Procedure: LEFT HEART CATHETERIZATION WITH CORONARY ANGIOGRAM;  Surgeon: [name], MD;  Location: [facility] CATH LAB;  Service: Cardiovascular;  Laterality: N/A;  . Left heart catheterization with coronary angiogram N/A 08/15/2014    Procedure: LEFT HEART CATHETERIZATION WITH  CORONARY ANGIOGRAM;  Surgeon: [name], MD;  Location: [facility] CATH LAB;  Service: Cardiovascular;  Laterality: N/A;     Current Medications: . [START ON 08/27/2014] metoprolol  2.5 mg Intravenous 3 times per day   Infusions:     (Not in a hospital admission)   Allergies  Allergen Reactions  . Ace Inhibitors  Cough  . Lipitor [Atorvastatin] Other (See Comments)    myalgia  . Zofran [Ondansetron Hcl] Nausea And Vomiting  . Codeine Itching    History   Social History  . Marital Status: Married    Spouse Name: N/A    Number of Children: 2  . Years of Education: 12   Occupational History  .  [employer]   Social History Main Topics  . Smoking status: Former Smoker -- 1.00 packs/day for 15 years    Types: Cigarettes    Quit date: 06/30/2010  . Smokeless tobacco: Former Systems developer    Types: [place] date: 06/30/2010  . Alcohol Use: 16.8 oz/week    28 Shots of liquor per week     Comment: 04/18/2013 "2 drinks/ day w/maybe 2 shots in each drink"  . Drug Use: No  . Sexual Activity: Yes   Other Topics Concern  . Not on file   Social History Narrative    Family History  Problem Relation Age of Onset  . Atrial fibrillation Mother   . Mitral valve prolapse Mother   . Coronary artery disease Father     CABG, aortic aneursym,   Family Status  Relation Status Death Age  . Mother Alive   . Father Alive     PHYSICAL EXAM: Filed Vitals:   08/26/14 1908  BP:   Pulse:   Temp: 99.3 F (37.4 C)  Resp:      Intake/Output Summary (Last 24 hours) at 08/26/14 1952 Last data filed at 08/26/14 1909  Gross per 24 hour  Intake   1500 ml  Output   1175 ml  Net    325 ml    General:  Well appearing. No respiratory difficulty HEENT: normal Neck: supple. no JVD. Carotids 2+ bilat; no bruits. No lymphadenopathy or thryomegaly appreciated. Cor: PMI nondisplaced. Regular rate & rhythm. No rubs, gallops or murmurs. Lungs: clear Abdomen: soft, RUQ tenderness, nondistended. No hepatosplenomegaly. No bruits or masses. Good bowel sounds. Extremities: no cyanosis, clubbing, rash, edema Neuro: alert & oriented x 3, cranial nerves grossly intact. moves all 4 extremities w/o difficulty. Affect pleasant.  Results for orders placed or performed during the hospital encounter of 08/26/14 (from the past  24 hour(s))  CBC with Differential     Status: Abnormal   Collection Time: 08/26/14 12:48 PM  Result Value Ref Range   WBC 9.3 4.0 - 10.5 K/uL   RBC 4.08 (L) 4.22 - 5.81 MIL/uL   Hemoglobin 11.7 (L) 13.0 - 17.0 g/dL   HCT 35.6 (L) 39.0 - 52.0 %   MCV 87.3 78.0 - 100.0 fL   MCH 28.7 26.0 - 34.0 pg   MCHC 32.9 30.0 - 36.0 g/dL   RDW 14.3 11.5 - 15.5 %   Platelets 277 150 - 400 K/uL   Neutrophils Relative % 60 43 - 77 %   Neutro Abs 5.7 1.7 - 7.7 K/uL   Lymphocytes Relative 29 12 - 46 %   Lymphs Abs 2.7 0.7 - 4.0 K/uL   Monocytes Relative 11 3 - 12 %   Monocytes Absolute 1.0 0.1 - 1.0 K/uL  Eosinophils Relative 0 0 - 5 %   Eosinophils Absolute 0.0 0.0 - 0.7 K/uL   Basophils Relative 0 0 - 1 %   Basophils Absolute 0.0 0.0 - 0.1 K/uL  Comprehensive metabolic panel     Status: Abnormal   Collection Time: 08/26/14 12:48 PM  Result Value Ref Range   Sodium 128 (L) 135 - 145 mmol/L   Potassium 4.0 3.5 - 5.1 mmol/L   Chloride 89 (L) 96 - 112 mmol/L   CO2 29 19 - 32 mmol/L   Glucose, Bld 136 (H) 70 - 99 mg/dL   BUN 27 (H) 6 - 23 mg/dL   Creatinine, Ser 1.75 (H) 0.50 - 1.35 mg/dL   Calcium 8.7 8.4 - 10.5 mg/dL   Total Protein 6.5 6.0 - 8.3 g/dL   Albumin 3.0 (L) 3.5 - 5.2 g/dL   AST 26 0 - 37 U/L   ALT 28 0 - 53 U/L   Alkaline Phosphatase 70 39 - 117 U/L   Total Bilirubin 1.2 0.3 - 1.2 mg/dL   GFR calc non Af Amer 44 (L) >90 mL/min   GFR calc Af Amer 51 (L) >90 mL/min   Anion gap 10 5 - 15  Lipase, blood     Status: Abnormal   Collection Time: 08/26/14 12:48 PM  Result Value Ref Range   Lipase 70 (H) 11 - 59 U/L  Troponin I     Status: None   Collection Time: 08/26/14  1:53 PM  Result Value Ref Range   Troponin I <0.03 <0.031 ng/mL  Urinalysis, Routine w reflex microscopic     Status: Abnormal   Collection Time: 08/26/14  2:55 PM  Result Value Ref Range   Color, Urine AMBER (A) YELLOW   APPearance CLEAR CLEAR   Specific Gravity, Urine 1.014 1.005 - 1.030   pH 5.0 5.0 -  8.0   Glucose, UA NEGATIVE NEGATIVE mg/dL   Hgb urine dipstick NEGATIVE NEGATIVE   Bilirubin Urine NEGATIVE NEGATIVE   Ketones, ur NEGATIVE NEGATIVE mg/dL   Protein, ur NEGATIVE NEGATIVE mg/dL   Urobilinogen, UA 0.2 0.0 - 1.0 mg/dL   Nitrite NEGATIVE NEGATIVE   Leukocytes, UA NEGATIVE NEGATIVE  Sodium, urine, random     Status: None   Collection Time: 08/26/14  2:55 PM  Result Value Ref Range   Sodium, Ur 38 mmol/L  Protime-INR     Status: Abnormal   Collection Time: 08/26/14  6:35 PM  Result Value Ref Range   Prothrombin Time 16.7 (H) 11.6 - 15.2 seconds   INR 1.33 0.00 - 1.49  I-Stat CG4 Lactic Acid, ED     Status: None   Collection Time: 08/26/14  6:49 PM  Result Value Ref Range   Lactic Acid, Venous 0.55 0.5 - 2.0 mmol/L   Radiology:  Ct Abdomen Pelvis Wo Contrast  08/26/2014   CLINICAL DATA:  Right upper quadrant pain. Nausea, vomiting and diarrhea. Pain for 2 days. Pain has been increasing.  EXAM: CT ABDOMEN AND PELVIS WITHOUT CONTRAST  TECHNIQUE: Multidetector CT imaging of the abdomen and pelvis was performed following the standard protocol without IV contrast.  COMPARISON:  CT, 08/07/2014  FINDINGS: There are significant inflammatory changes in the right lower quadrant surrounding the distal ileum and cecum. The appendix is not definitively seen. It may be hidden within free fluid in the right lower quadrant. Small bowel loops show no wall thickening and there are associated prominent mesenteric lymph nodes. A small amount of free fluid is associated  with the inflammatory change in the right lower quadrant. Along 1 loop of the thick-walled, right lower quadrant small bowel there is extraluminal air. There is minimal extraluminal contrast along an area of apparent wall dehiscence.  Small right pleural effusion. There is dependent subsegmental atelectasis in the lower lobes, right greater than left. Heart is normal in size.  Dense material lies in the right liver lobe, new from the  prior exam, reflecting the replaced endovascular coils. No other liver abnormality.  Spleen, gallbladder, pancreas, adrenal glands:  Normal.  2.9 cm low-density left renal mass with an associated calcification, unchanged. Is protrudes from the posterior midpole. No other renal abnormality. Normal ureters. Bladder is unremarkable.  Remainder the bowel is unremarkable.  No free air. Hernia mesh lies along the anterior lower abdominal wall.  Degenerative changes noted of the visualized spine. No osteoblastic or osteolytic lesions.  IMPRESSION: 1. There are significant inflammatory in the right lower quadrant centered on thick-walled loops of small bowel. One affected loop has a dehiscent wall where there is a small amount extraluminal contrast and air. A small amount of fluid is associated with the inflammatory right lower quadrant changes. Given recent endovascular procedures, the possibility of the abnormal small bowel loops being due to ischemia should be considered. The etiology could be infectious or inflammatory. 2. Appendix not visualized. Findings appear to be due to small bowel and not from a ruptured appendicitis.   Electronically Signed   By: Lajean Manes M.D.   On: 08/26/2014 17:20   Dg Abd 1 View  08/25/2014   CLINICAL DATA:  Abdominal pain for 3 weeks.  EXAM: ABDOMEN - 1 VIEW  COMPARISON:  08/12/2014  FINDINGS: There are a few dilated loops of small bowel within the left upper quadrant of the abdomen which measure up to 3.9 cm. When compared with the previous exam the degree of bowel distention is significantly improved. Hernia mesh overlies the pelvis.  IMPRESSION: 1. A few persistent dilated loops of small bowel in the left upper quadrant.   Electronically Signed   By: Kerby Moors M.D.   On: 08/25/2014 18:25    ECG: Sinus tach 105, normal axis, inverted T waves in leads III and aVF consistent with old inferior myocardial infarction   ECHO: On 08/15/2014 showed normal LVEF 60-65%, impaired LV  relaxation, normal LV filling pressures, normal RV function, no significant valvular abnormalities  Limited bedside ECHO: normal RV, LV function, IVC is normal in size and collapses with respiration  ASSESSMENT: Sinus tachycardia 105 bpm, normal axis, no significant ST segment changes Perioperative cardiovascular risk assessment Coronary artery disease s/p PTCA in-stent thrombosis on 08/15/2014 Hypercoagulable state with recent mesenteric venous doses believed to be secondary to testosterone use  now with perforated small bowel   Patient is a high risk for in-stent thrombosis and overall high risk for perioperative cardiac complications given myocardial infarction with being the last 30 days, he has some sort of hypercoagulable state that was believed to be secondary to testosterone use, however he probably has something else going on that makes him prone to clotting to that extent. Patient got 10 days of aspirin and Plavix since the day of his procedure. Current ACC guidelines recommend 14-28 days of dual antiplatelet therapy after PTCA. Therefore given the circumstances stopping the Plavix without any further bridging with IV IIb/IIIa would be the most reasonable thing to do area also given patient's acute renal failure Tirofiban then will need to be stopped for 4-8 hours  prior to surgical intervention. After discussion with Dr. Donne Hazel that will not be a good option if patient needs to go to the or emergently.  currently balance in the risk of bleeding and the risk of recurrent in-stent thrombosis Me and Dr. Donne Hazel  decided to proceed with the full and plan : - Continue patient on aspirin 81 mg daily starting tomorrow morning  - Start heparin drip no bolus for history of recent VTE - No Plavix  -  assessing patient's volume status at the bedside it appears that patient can be bolused with at least couple liters of crystalloids given his acute renal failure and then reassessed further  - Agree  with admitting patient to the intensive care unit, if patient is intubated postoperatively recommend serial troponin morning monitoring, and daily EKGs     Inez Pilgrim, MD 08/26/2014 7:52 PM

## 2014-08-26 NOTE — ED Provider Notes (Signed)
CSN: [id number]     Arrival date & time 08/26/14  1222 History   First MD Initiated Contact with Patient 08/26/14 1240     Chief Complaint  Patient presents with  . Abdominal Pain     (Consider location/radiation/quality/duration/timing/severity/associated sxs/prior Treatment) HPI Comments: Patient is a 49 yo M PMHx significant for HTN, CAD, MI, Migraines, CVA presenting to the ED for right upper quadrant abdominal pain for 4 days has been gradually worsening. Associated with decreased appetite and smaller bowel movements. States is similar to his previous ER visit January 1. Pain is 10 out of 10 now constant. No modifying factors identified. Does not radiate to back flank or down into the testicle area.  Patient is a 49 y.o. male presenting with abdominal pain.  Abdominal Pain Associated symptoms: constipation     Past Medical History  Diagnosis Date  . Hypertension   . Coronary artery disease   . S/P angioplasty with stent (angiosculpt) of RCA for "in stent restenosis" 07/06/2012  . Hypercholesteremia   . Anginal pain   . NSTEMI (non-ST elevated myocardial infarction) 07/2010    r/t total occlusion of RCA (3 Taxus Ion DES)  . Stroke 2011    S/P cardiac cath - embolic stroke; denies residual (07/06/2012)  . Migraines     "maybe one/yr" (07/06/2012)  . 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 04/19/2013    No evidence of CVA on MRI/MRA & Dliated Eye Examination. ruptured blood vessel & not occlusion.  . Portal vein thrombosis    Past Surgical History  Procedure Laterality Date  . Septoplasty  2001  . Coronary angioplasty with stent placement  07/2010    inferior wall MI - 3 Taxus Ion DES (3.0x28mm, 3.0x35mm, 3.5x85mm) to prox and mid RCA  . Coronary angioplasty  07/06/2012    in-stent restenotic lesion in RCA 80%   . Cardiac catheterization  04/18/2013  . Left heart catheterization  with coronary angiogram N/A 06/11/2012    Procedure: LEFT HEART CATHETERIZATION WITH CORONARY ANGIOGRAM;  Surgeon: [name], MD;  Location: [place] CATH LAB;  Service: Cardiovascular;  Laterality: N/A;  . Percutaneous coronary stent intervention (pci-s) N/A 07/06/2012    Procedure: PERCUTANEOUS CORONARY STENT INTERVENTION (PCI-S);  Surgeon: [name], MD;  Location: [facility] CATH LAB;  Service: Cardiovascular;  Laterality: N/A;  . Left heart catheterization with coronary angiogram N/A 04/18/2013    Procedure: LEFT HEART CATHETERIZATION WITH CORONARY ANGIOGRAM;  Surgeon: [name], MD;  Location: [facility] CATH LAB;  Service: Cardiovascular;  Laterality: N/A;  . Left heart catheterization with coronary angiogram N/A 08/15/2014    Procedure: LEFT HEART CATHETERIZATION WITH CORONARY ANGIOGRAM;  Surgeon: [name], MD;  Location: [facility] CATH LAB;  Service: Cardiovascular;  Laterality: N/A;   Family History  Problem Relation Age of Onset  . Atrial fibrillation Mother   . Mitral valve prolapse Mother   . Coronary artery disease Father     CABG, aortic aneursym,   History  Substance Use Topics  . Smoking status: Former Smoker -- 1.00 packs/day for 15 years    Types: Cigarettes    Quit date: 06/30/2010  . Smokeless tobacco: Former Systems developer    Types: [place] date: 06/30/2010  . Alcohol Use: 16.8 oz/week    28 Shots of liquor per week     Comment: 04/18/2013 "2 drinks/ day w/maybe 2 shots in  each drink"    Review of Systems  Gastrointestinal: Positive for abdominal pain and constipation.  All other systems reviewed and are negative.     Allergies  Ace inhibitors; Lipitor; Zofran; and Codeine  Home Medications   Prior to Admission medications   Medication Sig Start Date End Date Taking? Authorizing Provider  acetaminophen (TYLENOL) 500 MG tablet Take 1 tablet (500 mg total) by mouth every 4 (four) hours as needed for mild pain, fever or headache. 08/16/14  Yes Cherene Altes, MD    allopurinol (ZYLOPRIM) 300 MG tablet Take 1 tablet by mouth daily. 07/03/14  Yes Historical Provider, MD  aspirin 81 MG chewable tablet Chew 1 tablet (81 mg total) by mouth daily. 07/07/12  Yes Erlene Quan, PA-C  clopidogrel (PLAVIX) 75 MG tablet Take 1 tablet (75 mg total) by mouth daily with breakfast. 08/16/14  Yes Cherene Altes, MD  dabigatran (PRADAXA) 150 MG CAPS capsule Take 1 capsule (150 mg total) by mouth every 12 (twelve) hours. 08/16/14  Yes Cherene Altes, MD  fenofibrate 160 MG tablet Take 1 tablet (160 mg total) by mouth daily. 04/18/13  Yes Brittainy Erie Noe, PA-C  furosemide (LASIX) 20 MG tablet Take 20 mg by mouth daily.   Yes Historical Provider, MD  metoprolol succinate (TOPROL-XL) 50 MG 24 hr tablet Take 50 mg by mouth daily. Take with or immediately following a meal.   Yes Historical Provider, MD  olmesartan-hydrochlorothiazide (BENICAR HCT) 40-25 MG per tablet Take 1 tablet by mouth daily.   Yes Historical Provider, MD  omega-3 acid ethyl esters (LOVAZA) 1 G capsule Take 1 capsule (1 g total) by mouth daily. <please make appointment>   Yes Brittainy Erie Noe, PA-C  oxymorphone (OPANA) 10 MG tablet Take 10 mg by mouth 2 (two) times daily as needed for pain.   Yes Historical Provider, MD  traMADol (ULTRAM) 50 MG tablet Take 1-2 tablets (50-100 mg total) by mouth every 6 (six) hours as needed for moderate pain or severe pain. 08/16/14  Yes Cherene Altes, MD  nitroGLYCERIN (NITROSTAT) 0.4 MG SL tablet Place 1 tablet (0.4 mg total) under the tongue every 5 (five) minutes x 3 doses as needed for chest pain. 07/07/12   Erlene Quan, PA-C  oxymorphone (OPANA) 10 MG tablet Take 10 mg by mouth 2 (two) times daily. 08/20/14   Historical Provider, MD   BP 116/65 mmHg  Pulse 94  Temp(Src) 98.8 F (37.1 C) (Oral)  Resp 17  SpO2 99% Physical Exam  Constitutional: He is oriented to person, place, and time. He appears well-developed and well-nourished. No distress.  HENT:   Head: Normocephalic and atraumatic.  Right Ear: External ear normal.  Left Ear: External ear normal.  Nose: Nose normal.  Mouth/Throat: Oropharynx is clear and moist.  Eyes: Conjunctivae are normal.  Neck: Neck supple.  Cardiovascular: Normal rate, regular rhythm and normal heart sounds.   Pulmonary/Chest: Effort normal and breath sounds normal. No respiratory distress.  Abdominal: Soft. Normal appearance and bowel sounds are normal. He exhibits no distension. There is tenderness in the right upper quadrant. There is no rigidity, no rebound and no guarding.    Musculoskeletal: Normal range of motion.  Neurological: He is alert and oriented to person, place, and time.  Skin: Skin is warm and dry. He is not diaphoretic.  Nursing note and vitals reviewed.   ED Course  Procedures (including critical care time) Medications  iohexol (OMNIPAQUE) 300 MG/ML solution 25 mL (not  administered)  HYDROmorphone (DILAUDID) injection 1 mg (1 mg Intravenous Given 08/26/14 1346)  sodium chloride 0.9 % bolus 1,000 mL (0 mLs Intravenous Stopped 08/26/14 1450)  promethazine (PHENERGAN) injection 25 mg (25 mg Intravenous Given 08/26/14 1454)    Labs Review Labs Reviewed  CBC WITH DIFFERENTIAL/PLATELET - Abnormal; Notable for the following:    RBC 4.08 (*)    Hemoglobin 11.7 (*)    HCT 35.6 (*)    All other components within normal limits  COMPREHENSIVE METABOLIC PANEL - Abnormal; Notable for the following:    Sodium 128 (*)    Chloride 89 (*)    Glucose, Bld 136 (*)    BUN 27 (*)    Creatinine, Ser 1.75 (*)    Albumin 3.0 (*)    GFR calc non Af Amer 44 (*)    GFR calc Af Amer 51 (*)    All other components within normal limits  LIPASE, BLOOD - Abnormal; Notable for the following:    Lipase 70 (*)    All other components within normal limits  URINALYSIS, ROUTINE W REFLEX MICROSCOPIC - Abnormal; Notable for the following:    Color, Urine AMBER (*)    All other components within normal limits   TROPONIN I  SODIUM, URINE, RANDOM    Imaging Review Dg Abd 1 View  08/25/2014   CLINICAL DATA:  Abdominal pain for 3 weeks.  EXAM: ABDOMEN - 1 VIEW  COMPARISON:  08/12/2014  FINDINGS: There are a few dilated loops of small bowel within the left upper quadrant of the abdomen which measure up to 3.9 cm. When compared with the previous exam the degree of bowel distention is significantly improved. Hernia mesh overlies the pelvis.  IMPRESSION: 1. A few persistent dilated loops of small bowel in the left upper quadrant.   Electronically Signed   By: Kerby Moors M.D.   On: 08/25/2014 18:25     EKG Interpretation None      MDM   Final diagnoses:  Abdominal pain in male    Filed Vitals:   08/26/14 1454  BP: 116/65  Pulse: 94  Temp: 98.8 F (37.1 C)  Resp: 17   Afebrile, NAD, non-toxic appearing, AAOx4.   Patient is nontoxic, nonseptic appearing, in no apparent distress.  Fluid bolus given.  Labs reviewed, slight increase in lipase, recent ETOH use (2 glasses of scotch). Hyponatremia noted without associated symptoms or history of worrying symptoms. Creatinine improving from recent hospitalization and yesterday. Troponin negative. Yesterday's AXR reviewed. Given history, physical, and abdominal tenderness on R side of abdomen without peritoneal signs will obtain CT scan. Patient signed out to Dr. Aline Brochure pending CT scan results. Pain managed at time of shift change.      Harlow Mares, PA-C 08/26/14 [place], MD 08/26/14 1904

## 2014-08-26 NOTE — ED Notes (Signed)
Pt was here early in January for blood clot in his portal vein. He is taking blood thinners for this and was discharged home with no pain. He has return of RUQ pain increasingly worse over past 2 days. He says it feels like when he had the blood clot at first. He was here yesterday for labs and [place] that "were normal."

## 2014-08-27 ENCOUNTER — Inpatient Hospital Stay (HOSPITAL_COMMUNITY): Payer: BLUE CROSS/BLUE SHIELD

## 2014-08-27 DIAGNOSIS — I2510 Atherosclerotic heart disease of native coronary artery without angina pectoris: Secondary | ICD-10-CM

## 2014-08-27 DIAGNOSIS — K631 Perforation of intestine (nontraumatic): Secondary | ICD-10-CM | POA: Diagnosis present

## 2014-08-27 DIAGNOSIS — Z9861 Coronary angioplasty status: Secondary | ICD-10-CM

## 2014-08-27 LAB — COMPREHENSIVE METABOLIC PANEL
ALBUMIN: 2.3 g/dL — AB (ref 3.5–5.2)
ALT: 21 U/L (ref 0–53)
AST: 19 U/L (ref 0–37)
Alkaline Phosphatase: 59 U/L (ref 39–117)
Anion gap: 10 (ref 5–15)
BILIRUBIN TOTAL: 1.1 mg/dL (ref 0.3–1.2)
BUN: 18 mg/dL (ref 6–23)
CALCIUM: 8 mg/dL — AB (ref 8.4–10.5)
CO2: 28 mmol/L (ref 19–32)
CREATININE: 1.42 mg/dL — AB (ref 0.50–1.35)
Chloride: 97 mmol/L (ref 96–112)
GFR calc Af Amer: 66 mL/min — ABNORMAL LOW (ref >90)
GFR, EST NON AFRICAN AMERICAN: 57 mL/min — AB (ref >90)
Glucose, Bld: 111 mg/dL — ABNORMAL HIGH (ref 70–99)
POTASSIUM: 3.8 mmol/L (ref 3.5–5.1)
Sodium: 135 mmol/L (ref 135–145)
Total Protein: 5.2 g/dL — ABNORMAL LOW (ref 6.0–8.3)

## 2014-08-27 LAB — CBC
HEMATOCRIT: 31.9 % — AB (ref 39.0–52.0)
Hemoglobin: 10.2 g/dL — ABNORMAL LOW (ref 13.0–17.0)
MCH: 28.1 pg (ref 26.0–34.0)
MCHC: 32 g/dL (ref 30.0–36.0)
MCV: 87.9 fL (ref 78.0–100.0)
Platelets: 264 10*3/uL (ref 150–400)
RBC: 3.63 MIL/uL — ABNORMAL LOW (ref 4.22–5.81)
RDW: 14.2 % (ref 11.5–15.5)
WBC: 7.9 10*3/uL (ref 4.0–10.5)

## 2014-08-27 LAB — PROTIME-INR
INR: 1.31 (ref 0.00–1.49)
PROTHROMBIN TIME: 16.4 s — AB (ref 11.6–15.2)

## 2014-08-27 LAB — HEPARIN LEVEL (UNFRACTIONATED)
Heparin Unfractionated: 0.25 IU/mL — ABNORMAL LOW (ref 0.30–0.70)
Heparin Unfractionated: 0.2 IU/mL — ABNORMAL LOW (ref 0.30–0.70)

## 2014-08-27 LAB — APTT: APTT: 58 s — AB (ref 24–37)

## 2014-08-27 LAB — CLOSTRIDIUM DIFFICILE BY PCR: Toxigenic C. Difficile by PCR: POSITIVE — AB

## 2014-08-27 LAB — MRSA PCR SCREENING: MRSA BY PCR: NEGATIVE

## 2014-08-27 MED ORDER — METRONIDAZOLE IN NACL 5-0.79 MG/ML-% IV SOLN
500.0000 mg | Freq: Three times a day (TID) | INTRAVENOUS | Status: DC
  Administered 2014-08-27 – 2014-09-02 (×18): 500 mg via INTRAVENOUS
  Filled 2014-08-27 (×22): qty 100

## 2014-08-27 NOTE — Progress Notes (Signed)
[facility] PROGRESS NOTE  Dylan [name] KGY:[id number] DOB: 11-Apr-1966 DOA: 08/26/2014 PCP: [name], MD Interim summary: Dylan [name] is a 49 y.o. male with prior complicated h/o of SMV and portal vein thrombosis s/p catheter directed thrombolytic therapy, complicated by NSTEMI , found to have thrombosis of the distal RCA stent s/p successful PTCA and angio seal closure, CVA, prior CAD, hyperlipidemia, acute on chronic renal insufficiency, discharged from the hospital on 1/13, comes in for right lower quadrant abdominal pain associated with nausea, and loose bowel movements. In ED,  CT abdomen and pelvis revealed bowel perforation with mesenteric LAD.  Assessment/Plan:                                     Bowel perforation with peritonitis: On IV antibiotics, IV fluids and pain control. Surgery on board.  c diff pcr positive, started the patient on IV flagyl.    CAD s/p PTCA: On Aspirin and IV heparin.    DVT prophylaxis.   Code Status: full  Family Communication: wife at bedside Disposition Plan: pending   Consultants:  Surgery   cardiology  Procedures:  none  Antibiotics:  Flagyl 1/24  Zosyn 1/24  HPI/Subjective:   Objective: Filed Vitals:   08/27/14 1300  BP:   Pulse:   Temp: 99.2 F (37.3 C)  Resp:     Intake/Output Summary (Last 24 hours) at 08/27/14 1534 Last data filed at 08/27/14 1500  Gross per 24 hour  Intake 3546.7 ml  Output   2275 ml  Net 1271.7 ml   Filed Weights   08/26/14 2055  Weight: 121.4 kg (267 lb 10.2 oz)    Exam:   General:  Alert afebrile comfortable  Cardiovascular: s1s2  Respiratory: ctab  Abdomen: soft tenderness in the right lower quadrant, bowel sounds decreased.   Musculoskeletal: no pedal edema.   Data Reviewed: Basic Metabolic Panel:  Recent Labs Lab 08/25/14 1722 08/26/14 1248 08/27/14 0325  NA 133* 128* 135  K 3.7 4.0 3.8  CL 94* 89* 97  CO2 32 29 28  GLUCOSE 127* 136*  111*  BUN 22 27* 18  CREATININE 1.88* 1.75* 1.42*  CALCIUM 8.9 8.7 8.0*   Liver Function Tests:  Recent Labs Lab 08/25/14 1722 08/26/14 1248 08/27/14 0325  AST 32 26 19  ALT 35 28 21  ALKPHOS 78 70 59  BILITOT 1.1 1.2 1.1  PROT 6.6 6.5 5.2*  ALBUMIN 3.2* 3.0* 2.3*    Recent Labs Lab 08/26/14 1248  LIPASE 70*   No results for input(s): AMMONIA in the last 168 hours. CBC:  Recent Labs Lab 08/25/14 1722 08/26/14 1248 08/27/14 0325  WBC 9.0 9.3 7.9  NEUTROABS  --  5.7  --   HGB 12.4* 11.7* 10.2*  HCT 38.0* 35.6* 31.9*  MCV 87.6 87.3 87.9  PLT 290 277 264   Cardiac Enzymes:  Recent Labs Lab 08/26/14 1353  TROPONINI <0.03   BNP (last 3 results) No results for input(s): PROBNP in the last 8760 hours. CBG: No results for input(s): GLUCAP in the last 168 hours.  Recent Results (from the past 240 hour(s))  Culture, blood (routine x 2)     Status: None (Preliminary result)   Collection Time: 08/25/14  5:47 PM  Result Value Ref Range Status   Specimen Description BLOOD ARM LEFT  Final   Special Requests BOTTLES DRAWN AEROBIC AND ANAEROBIC 10CC  Final   Culture   Final           BLOOD CULTURE RECEIVED NO GROWTH TO DATE CULTURE WILL BE HELD FOR 5 DAYS BEFORE ISSUING A FINAL NEGATIVE REPORT Performed at [facility]    Report Status PENDING  Incomplete  Culture, blood (routine x 2)     Status: None (Preliminary result)   Collection Time: 08/25/14  5:58 PM  Result Value Ref Range Status   Specimen Description BLOOD ARM RIGHT  Final   Special Requests   Final    BOTTLES DRAWN AEROBIC AND ANAEROBIC BLUE 10CC RED 5CC   Culture   Final           BLOOD CULTURE RECEIVED NO GROWTH TO DATE CULTURE WILL BE HELD FOR 5 DAYS BEFORE ISSUING A FINAL NEGATIVE REPORT Performed at [facility]    Report Status PENDING  Incomplete  MRSA PCR Screening     Status: None   Collection Time: 08/26/14 10:20 PM  Result Value Ref Range Status   MRSA by PCR NEGATIVE  NEGATIVE Final    Comment:        The GeneXpert MRSA Assay (FDA approved for NASAL specimens only), is one component of a comprehensive MRSA colonization surveillance program. It is not intended to diagnose MRSA infection nor to guide or monitor treatment for MRSA infections.   Clostridium Difficile by PCR     Status: Abnormal   Collection Time: 08/27/14  7:59 AM  Result Value Ref Range Status   C difficile by pcr POSITIVE (A) NEGATIVE Final    Comment: CRITICAL RESULT CALLED TO, READ BACK BY AND VERIFIED WITH: B.RONCALLO RN [id number] 08/27/14 E.GADDY      Studies: Ct Abdomen Pelvis Wo Contrast  08/26/2014   CLINICAL DATA:  Right upper quadrant pain. Nausea, vomiting and diarrhea. Pain for 2 days. Pain has been increasing.  EXAM: CT ABDOMEN AND PELVIS WITHOUT CONTRAST  TECHNIQUE: Multidetector CT imaging of the abdomen and pelvis was performed following the standard protocol without IV contrast.  COMPARISON:  CT, 08/07/2014  FINDINGS: There are significant inflammatory changes in the right lower quadrant surrounding the distal ileum and cecum. The appendix is not definitively seen. It may be hidden within free fluid in the right lower quadrant. Small bowel loops show no wall thickening and there are associated prominent mesenteric lymph nodes. A small amount of free fluid is associated with the inflammatory change in the right lower quadrant. Along 1 loop of the thick-walled, right lower quadrant small bowel there is extraluminal air. There is minimal extraluminal contrast along an area of apparent wall dehiscence.  Small right pleural effusion. There is dependent subsegmental atelectasis in the lower lobes, right greater than left. Heart is normal in size.  Dense material lies in the right liver lobe, new from the prior exam, reflecting the replaced endovascular coils. No other liver abnormality.  Spleen, gallbladder, pancreas, adrenal glands:  Normal.  2.9 cm low-density left renal mass with an  associated calcification, unchanged. Is protrudes from the posterior midpole. No other renal abnormality. Normal ureters. Bladder is unremarkable.  Remainder the bowel is unremarkable.  No free air. Hernia mesh lies along the anterior lower abdominal wall.  Degenerative changes noted of the visualized spine. No osteoblastic or osteolytic lesions.  IMPRESSION: 1. There are significant inflammatory in the right lower quadrant centered on thick-walled loops of small bowel. One affected loop has a dehiscent wall where there is a small amount extraluminal contrast and  air. A small amount of fluid is associated with the inflammatory right lower quadrant changes. Given recent endovascular procedures, the possibility of the abnormal small bowel loops being due to ischemia should be considered. The etiology could be infectious or inflammatory. 2. Appendix not visualized. Findings appear to be due to small bowel and not from a ruptured appendicitis.   Electronically Signed   By: Lajean Manes M.D.   On: 08/26/2014 17:20   Dg Abd 1 View  08/25/2014   CLINICAL DATA:  Abdominal pain for 3 weeks.  EXAM: ABDOMEN - 1 VIEW  COMPARISON:  08/12/2014  FINDINGS: There are a few dilated loops of small bowel within the left upper quadrant of the abdomen which measure up to 3.9 cm. When compared with the previous exam the degree of bowel distention is significantly improved. Hernia mesh overlies the pelvis.  IMPRESSION: 1. A few persistent dilated loops of small bowel in the left upper quadrant.   Electronically Signed   By: Kerby Moors M.D.   On: 08/25/2014 18:25   Dg Abd Portable 1v  08/27/2014   CLINICAL DATA:  Small bowel ischemia  EXAM: PORTABLE ABDOMEN - 1 VIEW  COMPARISON:  08/26/2014  FINDINGS: There has been progression of enteric contrast material from the small bowel into the colon and up to the rectum. A few air-filled loops of small bowel are identified within the left upper quadrant and right lower quadrant of the  abdomen. No significant small bowel dilatation noted.  IMPRESSION: 1. Progression of enteric contrast material up to the level of the rectum.   Electronically Signed   By: Kerby Moors M.D.   On: 08/27/2014 07:40    Scheduled Meds: . aspirin  81 mg Oral Daily  . metoprolol  2.5 mg Intravenous 3 times per day  . metronidazole  500 mg Intravenous 3 times per day  . piperacillin-tazobactam (ZOSYN)  IV  3.375 g Intravenous 3 times per day   Continuous Infusions: . sodium chloride 75 mL/hr at 08/27/14 0600  . heparin 2,400 Units/hr (08/27/14 1407)    Active Problems:   HTN (hypertension)   Acute embolism and thrombosis of vein   Mesenteric vein thrombosis   CAD S/P percutaneous coronary angioplasty   Intestinal perforation    Time spent: 25 minutes    [facility] Pager [phone] If 7PM-7AM, please contact night-coverage at [email or web address], password [facility] 08/27/2014, 3:34 PM  LOS: 1 day

## 2014-08-27 NOTE — Progress Notes (Signed)
Patient ID: Dylan [name], male   DOB: [birth year], 49 y.o.   MRN: [id number]    Subjective:  Denies SSCP, palpitations or Dyspnea Significant abdominal pain Does not appear toxic   Objective:  Filed Vitals:   08/27/14 0158 08/27/14 0432 08/27/14 0600 08/27/14 0808  BP: 111/65 101/62 113/58 114/66  Pulse: 98 90 93 92  Temp: 99.4 F (37.4 C)  99 F (37.2 C) 99.3 F (37.4 C)  TempSrc: Oral  Oral Oral  Resp: 21 18 12 20   Height:      Weight:      SpO2: 97% 99% 97% 91%    Intake/Output from previous day:  Intake/Output Summary (Last 24 hours) at 08/27/14 1017 Last data filed at 08/27/14 8676  Gross per 24 hour  Intake 2596.93 ml  Output   2275 ml  Net 321.93 ml    Physical Exam: Affect appropriate Healthy:  appears stated age HEENT: normal Neck supple with no adenopathy JVP normal no bruits no thyromegaly Lungs clear with no wheezing and good diaphragmatic motion Heart:  S1/S2 no murmur, no rub, gallop or click PMI normal Abdomen: mildly distended with rebound and significant pain  no bruit.  No HSM or HJR Distal pulses intact with no bruits No edema Neuro non-focal Skin warm and dry No muscular weakness   Lab Results: Basic Metabolic Panel:  Recent Labs  08/26/14 1248 08/27/14 0325  NA 128* 135  K 4.0 3.8  CL 89* 97  CO2 29 28  GLUCOSE 136* 111*  BUN 27* 18  CREATININE 1.75* 1.42*  CALCIUM 8.7 8.0*   Liver Function Tests:  Recent Labs  08/26/14 1248 08/27/14 0325  AST 26 19  ALT 28 21  ALKPHOS 70 59  BILITOT 1.2 1.1  PROT 6.5 5.2*  ALBUMIN 3.0* 2.3*    Recent Labs  08/26/14 1248  LIPASE 70*   CBC:  Recent Labs  08/26/14 1248 08/27/14 0325  WBC 9.3 7.9  NEUTROABS 5.7  --   HGB 11.7* 10.2*  HCT 35.6* 31.9*  MCV 87.3 87.9  PLT 277 264   Cardiac Enzymes:  Recent Labs  08/26/14 1353  TROPONINI <0.03    Imaging: Imaging results have been reviewed and Ct Abdomen Pelvis Wo Contrast  08/26/2014   CLINICAL DATA:  Right  upper quadrant pain. Nausea, vomiting and diarrhea. Pain for 2 days. Pain has been increasing.  EXAM: CT ABDOMEN AND PELVIS WITHOUT CONTRAST  TECHNIQUE: Multidetector CT imaging of the abdomen and pelvis was performed following the standard protocol without IV contrast.  COMPARISON:  CT, 08/07/2014  FINDINGS: There are significant inflammatory changes in the right lower quadrant surrounding the distal ileum and cecum. The appendix is not definitively seen. It may be hidden within free fluid in the right lower quadrant. Small bowel loops show no wall thickening and there are associated prominent mesenteric lymph nodes. A small amount of free fluid is associated with the inflammatory change in the right lower quadrant. Along 1 loop of the thick-walled, right lower quadrant small bowel there is extraluminal air. There is minimal extraluminal contrast along an area of apparent wall dehiscence.  Small right pleural effusion. There is dependent subsegmental atelectasis in the lower lobes, right greater than left. Heart is normal in size.  Dense material lies in the right liver lobe, new from the prior exam, reflecting the replaced endovascular coils. No other liver abnormality.  Spleen, gallbladder, pancreas, adrenal glands:  Normal.  2.9 cm low-density left renal mass with an  associated calcification, unchanged. Is protrudes from the posterior midpole. No other renal abnormality. Normal ureters. Bladder is unremarkable.  Remainder the bowel is unremarkable.  No free air. Hernia mesh lies along the anterior lower abdominal wall.  Degenerative changes noted of the visualized spine. No osteoblastic or osteolytic lesions.  IMPRESSION: 1. There are significant inflammatory in the right lower quadrant centered on thick-walled loops of small bowel. One affected loop has a dehiscent wall where there is a small amount extraluminal contrast and air. A small amount of fluid is associated with the inflammatory right lower quadrant  changes. Given recent endovascular procedures, the possibility of the abnormal small bowel loops being due to ischemia should be considered. The etiology could be infectious or inflammatory. 2. Appendix not visualized. Findings appear to be due to small bowel and not from a ruptured appendicitis.   Electronically Signed   By: Lajean Manes M.D.   On: 08/26/2014 17:20   Dg Abd 1 View  08/25/2014   CLINICAL DATA:  Abdominal pain for 3 weeks.  EXAM: ABDOMEN - 1 VIEW  COMPARISON:  08/12/2014  FINDINGS: There are a few dilated loops of small bowel within the left upper quadrant of the abdomen which measure up to 3.9 cm. When compared with the previous exam the degree of bowel distention is significantly improved. Hernia mesh overlies the pelvis.  IMPRESSION: 1. A few persistent dilated loops of small bowel in the left upper quadrant.   Electronically Signed   By: Kerby Moors M.D.   On: 08/25/2014 18:25   Dg Abd Portable 1v  08/27/2014   CLINICAL DATA:  Small bowel ischemia  EXAM: PORTABLE ABDOMEN - 1 VIEW  COMPARISON:  08/26/2014  FINDINGS: There has been progression of enteric contrast material from the small bowel into the colon and up to the rectum. A few air-filled loops of small bowel are identified within the left upper quadrant and right lower quadrant of the abdomen. No significant small bowel dilatation noted.  IMPRESSION: 1. Progression of enteric contrast material up to the level of the rectum.   Electronically Signed   By: Kerby Moors M.D.   On: 08/27/2014 07:40    Cardiac Studies:  ECG:  Orders placed or performed during the hospital encounter of 08/26/14  . EKG 12-Lead  . EKG 12-Lead     Telemetry:  SR sinus tachycardia no arrythmia   Echo:  1/12  EF 60-65%   Medications:   . aspirin  81 mg Oral Daily  . metoprolol  2.5 mg Intravenous 3 times per day  . metronidazole  500 mg Intravenous 3 times per day  . piperacillin-tazobactam (ZOSYN)  IV  3.375 g Intravenous 3 times per day       . sodium chloride 75 mL/hr at 08/27/14 0600  . heparin 2,200 Units/hr (08/27/14 0600)    Assessment/Plan:  CAD:  08/15/14 severe in stent stenosis RCA.  From reading Dr Darcus Pester note it appears that he performed angiosculpt and POB and did not place new stent See note by fellow after discussion with surgery high likelyhood of needing surgery ? Tomorrow for peritonitis.  High risk of periop MI.  Continue ASA, heparin and beta blockade.  Resume plavix and NOAC post op when possible.  D/C testosterone injections that may have contributed to hypercoabulable state with recent mesenteric vein thrombosis as well.  Continue antibiotic coverage  Plan per surgery   Jenkins Rouge 08/27/2014, 10:17 AM

## 2014-08-27 NOTE — Progress Notes (Signed)
ANTICOAGULATION CONSULT NOTE - Follow Up Consult  Pharmacy Consult for Heparin (Pradaxa on hold) Indication: SMV/PV thrombosis   Allergies  Allergen Reactions  . Ace Inhibitors Cough  . Lipitor [Atorvastatin] Other (See Comments)    myalgia  . Zofran [Ondansetron Hcl] Nausea And Vomiting  . Codeine Itching    Patient Measurements: Height: 6\' 2"  (188 cm) Weight: 267 lb 10.2 oz (121.4 kg) IBW/kg (Calculated) : 82.2  Vital Signs: Temp: 99.4 F (37.4 C) (01/24 0158) Temp Source: Oral (01/24 0158) BP: 101/62 mmHg (01/24 0432) Pulse Rate: 90 (01/24 0432)  Labs:  Recent Labs  08/25/14 1722 08/26/14 1248 08/26/14 1353 08/26/14 1835 08/27/14 0325  HGB 12.4* 11.7*  --   --  10.2*  HCT 38.0* 35.6*  --   --  31.9*  PLT 290 277  --   --  264  APTT  --   --   --   --  58*  LABPROT  --   --   --  16.7* 16.4*  INR  --   --   --  1.33 1.31  HEPARINUNFRC  --   --   --   --  <0.10*  CREATININE 1.88* 1.75*  --   --  1.42*  TROPONINI  --   --  <0.03  --   --     Estimated Creatinine Clearance: 87.1 mL/min (by C-G formula based on Cr of 1.42).   Assessment: Sub-therapeutic heparin level x 1, no issues per RN.   Goal of Therapy:  Heparin level 0.3-0.7 units/ml Monitor platelets by anticoagulation protocol: Yes   Plan:  -Increase heparin drip to 2200 units/hr -1200 HL -Daily CBC/HL -Monitor for bleeding -F/U OR plans  Narda Bonds 08/27/2014,4:35 AM

## 2014-08-27 NOTE — Progress Notes (Signed)
ANTICOAGULATION CONSULT NOTE - Follow Up Consult  Pharmacy Consult for Heparin Indication: SMV and portal vein thrombosis (pradaxa on hold)  Allergies  Allergen Reactions  . Ace Inhibitors Cough  . Lipitor [Atorvastatin] Other (See Comments)    myalgia  . Zofran [Ondansetron Hcl] Nausea And Vomiting  . Codeine Itching    Patient Measurements: Height: 6\' 2"  (188 cm) Weight: 267 lb 10.2 oz (121.4 kg) IBW/kg (Calculated) : 82.2 Heparin Dosing Weight: 108 kg  Vital Signs: Temp: 98.8 F (37.1 C) (01/24 1958) Temp Source: Oral (01/24 1958) BP: 119/64 mmHg (01/24 1958) Pulse Rate: 99 (01/24 1958)  Labs:  Recent Labs  08/25/14 1722 08/26/14 1248 08/26/14 1353 08/26/14 1835 08/27/14 0325 08/27/14 1310 08/27/14 1935  HGB 12.4* 11.7*  --   --  10.2*  --   --   HCT 38.0* 35.6*  --   --  31.9*  --   --   PLT 290 277  --   --  264  --   --   APTT  --   --   --   --  58*  --   --   LABPROT  --   --   --  16.7* 16.4*  --   --   INR  --   --   --  1.33 1.31  --   --   HEPARINUNFRC  --   --   --   --  <0.10* 0.25* 0.20*  CREATININE 1.88* 1.75*  --   --  1.42*  --   --   TROPONINI  --   --  <0.03  --   --   --   --     Estimated Creatinine Clearance: 87.1 mL/min (by C-G formula based on Cr of 1.42).   Medications:  Scheduled:  . aspirin  81 mg Oral Daily  . metoprolol  2.5 mg Intravenous 3 times per day  . metronidazole  500 mg Intravenous 3 times per day  . piperacillin-tazobactam (ZOSYN)  IV  3.375 g Intravenous 3 times per day   Infusions:  . sodium chloride 75 mL/hr at 08/27/14 0600  . heparin 2,400 Units/hr (08/27/14 2004)    Assessment: 49 yo m admitted on 1/23 for right lower quadrant pain. Recently here earlier this month for portal vein thrombosis, discharged on 1/13 on Pradaxa. Pharmacy is consulted to dose heparin.  Heparin drip 2400 uts/hr HL 0.2 less than goal.  Goal of Therapy:  Heparin level 0.3-0.7 units/ml Monitor platelets by anticoagulation  protocol: Yes   Plan:  Increase heparin infusion to 2650 units/hr Monitor hgb/plts, s/s of bleeding, clinical course  [name] Pharm.D. CPP, BCPS Clinical Pharmacist [phone] 08/27/2014 8:15 PM

## 2014-08-27 NOTE — Progress Notes (Signed)
[facility] Progress Note    Subjective: Patient still very sore in the RLQ.  Stable and fever has gone down.  Has some diarrhea, specimen has been held.  Objective: Vital signs in last 24 hours: Temp:  [98.8 F (37.1 C)-102.7 F (39.3 C)] 99 F (37.2 C) (01/24 0600) Pulse Rate:  [90-109] 93 (01/24 0600) Resp:  [11-21] 12 (01/24 0600) BP: (88-123)/(53-74) 113/58 mmHg (01/24 0600) SpO2:  [90 %-99 %] 97 % (01/24 0600) Weight:  [121.4 kg (267 lb 10.2 oz)] 121.4 kg (267 lb 10.2 oz) (01/23 2055) Last BM Date: 08/26/14  Intake/Output from previous day: 01/23 0701 - 01/24 0700 In: 2256.4 [I.V.:2206.4; IV Piggyback:50] Out: 9323 [Urine:1725] Intake/Output this shift:    General: No acute distress but is sore.  Lungs: Clear  Abd: Soft, good bowel sounds.  Tender in the RLQ, not sore in the LLQ or diffusely.  Extremities: No DVT, No signs or symptoms of DVT  Neuro: Intact  Lab Results:  @LABLAST2 (wbc:2,hgb:2,hct:2,plt:2) BMET  Recent Labs  08/26/14 1248 08/27/14 0325  NA 128* 135  K 4.0 3.8  CL 89* 97  CO2 29 28  GLUCOSE 136* 111*  BUN 27* 18  CREATININE 1.75* 1.42*  CALCIUM 8.7 8.0*   PT/INR  Recent Labs  08/26/14 1835 08/27/14 0325  LABPROT 16.7* 16.4*  INR 1.33 1.31   ABG No results for input(s): PHART, HCO3 in the last 72 hours.  Invalid input(s): PCO2, PO2  Studies/Results: Ct Abdomen Pelvis Wo Contrast  08/26/2014   CLINICAL DATA:  Right upper quadrant pain. Nausea, vomiting and diarrhea. Pain for 2 days. Pain has been increasing.  EXAM: CT ABDOMEN AND PELVIS WITHOUT CONTRAST  TECHNIQUE: Multidetector CT imaging of the abdomen and pelvis was performed following the standard protocol without IV contrast.  COMPARISON:  CT, 08/07/2014  FINDINGS: There are significant inflammatory changes in the right lower quadrant surrounding the distal ileum and cecum. The appendix is not definitively seen. It may be hidden within free fluid in the right lower quadrant.  Small bowel loops show no wall thickening and there are associated prominent mesenteric lymph nodes. A small amount of free fluid is associated with the inflammatory change in the right lower quadrant. Along 1 loop of the thick-walled, right lower quadrant small bowel there is extraluminal air. There is minimal extraluminal contrast along an area of apparent wall dehiscence.  Small right pleural effusion. There is dependent subsegmental atelectasis in the lower lobes, right greater than left. Heart is normal in size.  Dense material lies in the right liver lobe, new from the prior exam, reflecting the replaced endovascular coils. No other liver abnormality.  Spleen, gallbladder, pancreas, adrenal glands:  Normal.  2.9 cm low-density left renal mass with an associated calcification, unchanged. Is protrudes from the posterior midpole. No other renal abnormality. Normal ureters. Bladder is unremarkable.  Remainder the bowel is unremarkable.  No free air. Hernia mesh lies along the anterior lower abdominal wall.  Degenerative changes noted of the visualized spine. No osteoblastic or osteolytic lesions.  IMPRESSION: 1. There are significant inflammatory in the right lower quadrant centered on thick-walled loops of small bowel. One affected loop has a dehiscent wall where there is a small amount extraluminal contrast and air. A small amount of fluid is associated with the inflammatory right lower quadrant changes. Given recent endovascular procedures, the possibility of the abnormal small bowel loops being due to ischemia should be considered. The etiology could be infectious or inflammatory. 2. Appendix not visualized.  Findings appear to be due to small bowel and not from a ruptured appendicitis.   Electronically Signed   By: Lajean Manes M.D.   On: 08/26/2014 17:20   Dg Abd 1 View  08/25/2014   CLINICAL DATA:  Abdominal pain for 3 weeks.  EXAM: ABDOMEN - 1 VIEW  COMPARISON:  08/12/2014  FINDINGS: There are a few  dilated loops of small bowel within the left upper quadrant of the abdomen which measure up to 3.9 cm. When compared with the previous exam the degree of bowel distention is significantly improved. Hernia mesh overlies the pelvis.  IMPRESSION: 1. A few persistent dilated loops of small bowel in the left upper quadrant.   Electronically Signed   By: Kerby Moors M.D.   On: 08/25/2014 18:25   Dg Abd Portable 1v  08/27/2014   CLINICAL DATA:  Small bowel ischemia  EXAM: PORTABLE ABDOMEN - 1 VIEW  COMPARISON:  08/26/2014  FINDINGS: There has been progression of enteric contrast material from the small bowel into the colon and up to the rectum. A few air-filled loops of small bowel are identified within the left upper quadrant and right lower quadrant of the abdomen. No significant small bowel dilatation noted.  IMPRESSION: 1. Progression of enteric contrast material up to the level of the rectum.   Electronically Signed   By: Kerby Moors M.D.   On: 08/27/2014 07:40    Anti-infectives: Anti-infectives    Start     Dose/Rate Route Frequency Ordered Stop   08/27/14 1200  vancomycin (VANCOCIN) IVPB 1000 mg/200 mL premix     1,000 mg200 mL/hr over 60 Minutes Intravenous Every 12 hours 08/26/14 2125     08/26/14 2300  vancomycin (VANCOCIN) 2,500 mg in sodium chloride 0.9 % 500 mL IVPB     2,500 mg250 mL/hr over 120 Minutes Intravenous  Once 08/26/14 2125 08/27/14 0050   08/26/14 2200  piperacillin-tazobactam (ZOSYN) IVPB 3.375 g     3.375 g12.5 mL/hr over 240 Minutes Intravenous 3 times per day 08/26/14 2125        Assessment/Plan: s/p  Changed antibiotics for more anaerobic coverage with Flagyl.  Does not need Vanco.  Will send stool fo C.diff  LOS: 1 day   Kathryne Eriksson. Dahlia Bailiff, MD, FACS [phone] [facility] 08/27/2014

## 2014-08-27 NOTE — Progress Notes (Signed)
ANTICOAGULATION CONSULT NOTE - Follow Up Consult  Pharmacy Consult for Heparin Indication: SMV and portal vein thrombosis (pradaxa on hold)  Allergies  Allergen Reactions  . Ace Inhibitors Cough  . Lipitor [Atorvastatin] Other (See Comments)    myalgia  . Zofran [Ondansetron Hcl] Nausea And Vomiting  . Codeine Itching    Patient Measurements: Height: 6\' 2"  (188 cm) Weight: 267 lb 10.2 oz (121.4 kg) IBW/kg (Calculated) : 82.2 Heparin Dosing Weight: 108 kg  Vital Signs: Temp: 99.2 F (37.3 C) (01/24 1300) Temp Source: Oral (01/24 1300) BP: 114/66 mmHg (01/24 0808) Pulse Rate: 92 (01/24 0808)  Labs:  Recent Labs  08/25/14 1722 08/26/14 1248 08/26/14 1353 08/26/14 1835 08/27/14 0325 08/27/14 1310  HGB 12.4* 11.7*  --   --  10.2*  --   HCT 38.0* 35.6*  --   --  31.9*  --   PLT 290 277  --   --  264  --   APTT  --   --   --   --  58*  --   LABPROT  --   --   --  16.7* 16.4*  --   INR  --   --   --  1.33 1.31  --   HEPARINUNFRC  --   --   --   --  <0.10* 0.25*  CREATININE 1.88* 1.75*  --   --  1.42*  --   TROPONINI  --   --  <0.03  --   --   --     Estimated Creatinine Clearance: 87.1 mL/min (by C-G formula based on Cr of 1.42).   Medications:  Scheduled:  . aspirin  81 mg Oral Daily  . metoprolol  2.5 mg Intravenous 3 times per day  . metronidazole  500 mg Intravenous 3 times per day  . piperacillin-tazobactam (ZOSYN)  IV  3.375 g Intravenous 3 times per day   Infusions:  . sodium chloride 75 mL/hr at 08/27/14 0600  . heparin 2,200 Units/hr (08/27/14 0600)    Assessment: 49 yo m admitted on 1/23 for right lower quadrant pain. Recently here earlier this month for portal vein thrombosis, discharged on 1/13 on Pradaxa. Pharmacy is consulted to dose heparin.  HL this afternoon is subtherapeutic at 0.25 on 2200 units/hr. Hgb 10.2, plts 264, no s/s of bleeding or infusion problems per RN.  Goal of Therapy:  Heparin level 0.3-0.7 units/ml Monitor platelets by  anticoagulation protocol: Yes   Plan:  Increase heparin infusion to 2400 units/hr 6-hr HL @ 2000 Monitor hgb/plts, s/s of bleeding, clinical course  Ewelina Naves L. Nicole Kindred, PharmD Clinical Pharmacy Resident Pager: [phone] 08/27/2014 2:05 PM

## 2014-08-28 ENCOUNTER — Encounter (HOSPITAL_COMMUNITY): Payer: Self-pay | Admitting: Anesthesiology

## 2014-08-28 ENCOUNTER — Encounter (HOSPITAL_COMMUNITY)
Admission: EM | Disposition: A | Discharge status: Home / Self Care | Payer: Self-pay | Source: Home / Self Care | Attending: Internal Medicine

## 2014-08-28 ENCOUNTER — Inpatient Hospital Stay (HOSPITAL_COMMUNITY): Payer: BLUE CROSS/BLUE SHIELD | Admitting: Anesthesiology

## 2014-08-28 DIAGNOSIS — I10 Essential (primary) hypertension: Secondary | ICD-10-CM

## 2014-08-28 DIAGNOSIS — Z9049 Acquired absence of other specified parts of digestive tract: Secondary | ICD-10-CM

## 2014-08-28 DIAGNOSIS — K631 Perforation of intestine (nontraumatic): Principal | ICD-10-CM

## 2014-08-28 HISTORY — DX: Acquired absence of other specified parts of digestive tract: Z90.49

## 2014-08-28 HISTORY — PX: LAPAROSCOPIC APPENDECTOMY: SHX408

## 2014-08-28 HISTORY — PX: LAPAROSCOPY: SHX197

## 2014-08-28 HISTORY — PX: BOWEL RESECTION: SHX1257

## 2014-08-28 LAB — CBC
HCT: 31.5 % — ABNORMAL LOW (ref 39.0–52.0)
HCT: 37.6 % — ABNORMAL LOW (ref 39.0–52.0)
HEMATOCRIT: 34.8 % — AB (ref 39.0–52.0)
Hemoglobin: 10 g/dL — ABNORMAL LOW (ref 13.0–17.0)
Hemoglobin: 11 g/dL — ABNORMAL LOW (ref 13.0–17.0)
Hemoglobin: 12 g/dL — ABNORMAL LOW (ref 13.0–17.0)
MCH: 28.4 pg (ref 26.0–34.0)
MCH: 28.1 pg (ref 26.0–34.0)
MCH: 28.2 pg (ref 26.0–34.0)
MCHC: 31.7 g/dL (ref 30.0–36.0)
MCHC: 31.6 g/dL (ref 30.0–36.0)
MCHC: 31.9 g/dL (ref 30.0–36.0)
MCV: 89.5 fL (ref 78.0–100.0)
MCV: 89 fL (ref 78.0–100.0)
MCV: 88.5 fL (ref 78.0–100.0)
PLATELETS: 297 10*3/uL (ref 150–400)
PLATELETS: 303 10*3/uL (ref 150–400)
Platelets: 337 10*3/uL (ref 150–400)
RBC: 3.52 MIL/uL — ABNORMAL LOW (ref 4.22–5.81)
RBC: 3.91 MIL/uL — ABNORMAL LOW (ref 4.22–5.81)
RBC: 4.25 MIL/uL (ref 4.22–5.81)
RDW: 14.2 % (ref 11.5–15.5)
RDW: 14.1 % (ref 11.5–15.5)
RDW: 14.1 % (ref 11.5–15.5)
WBC: 5.7 10*3/uL (ref 4.0–10.5)
WBC: 5.5 10*3/uL (ref 4.0–10.5)
WBC: 6.9 10*3/uL (ref 4.0–10.5)

## 2014-08-28 LAB — HEPARIN LEVEL (UNFRACTIONATED)
Heparin Unfractionated: 0.24 IU/mL — ABNORMAL LOW (ref 0.30–0.70)
Heparin Unfractionated: 0.12 IU/mL — ABNORMAL LOW (ref 0.30–0.70)
Heparin Unfractionated: 0.3 IU/mL (ref 0.30–0.70)

## 2014-08-28 LAB — TYPE AND SCREEN
ABO/RH(D): B POS
ANTIBODY SCREEN: NEGATIVE

## 2014-08-28 LAB — ABO/RH: ABO/RH(D): B POS

## 2014-08-28 SURGERY — APPENDECTOMY, LAPAROSCOPIC
Anesthesia: General | Site: Abdomen

## 2014-08-28 MED ORDER — FENTANYL CITRATE 0.05 MG/ML IJ SOLN
INTRAMUSCULAR | Status: AC
  Filled 2014-08-28: qty 5

## 2014-08-28 MED ORDER — PROMETHAZINE HCL 25 MG/ML IJ SOLN: 6.2500 mg | INTRAMUSCULAR | Status: DC | PRN

## 2014-08-28 MED ORDER — ONDANSETRON HCL 4 MG/2ML IJ SOLN
4.0000 mg | Freq: Four times a day (QID) | INTRAMUSCULAR | Status: DC | PRN
  Filled 2014-08-28 (×2): qty 2

## 2014-08-28 MED ORDER — GLYCOPYRROLATE 0.2 MG/ML IJ SOLN
INTRAMUSCULAR | Status: DC | PRN
  Administered 2014-08-28: 0.4 mg via INTRAVENOUS
  Administered 2014-08-28: 0.2 mg via INTRAVENOUS

## 2014-08-28 MED ORDER — FENTANYL CITRATE 0.05 MG/ML IJ SOLN
INTRAMUSCULAR | Status: DC | PRN
  Administered 2014-08-28: 100 ug via INTRAVENOUS
  Administered 2014-08-28: 50 ug via INTRAVENOUS
  Administered 2014-08-28: 150 ug via INTRAVENOUS
  Administered 2014-08-28: 50 ug via INTRAVENOUS
  Administered 2014-08-28: 100 ug via INTRAVENOUS
  Administered 2014-08-28: 150 ug via INTRAVENOUS
  Administered 2014-08-28: 100 ug via INTRAVENOUS
  Administered 2014-08-28: 50 ug via INTRAVENOUS

## 2014-08-28 MED ORDER — METOPROLOL TARTRATE 1 MG/ML IV SOLN
INTRAVENOUS | Status: DC | PRN
  Administered 2014-08-28 (×4): 2.5 mg via INTRAVENOUS

## 2014-08-28 MED ORDER — HYDROMORPHONE HCL 1 MG/ML IJ SOLN
INTRAMUSCULAR | Status: AC
  Filled 2014-08-28: qty 1

## 2014-08-28 MED ORDER — SUCCINYLCHOLINE CHLORIDE 20 MG/ML IJ SOLN
INTRAMUSCULAR | Status: AC
  Filled 2014-08-28: qty 1

## 2014-08-28 MED ORDER — ROCURONIUM BROMIDE 50 MG/5ML IV SOLN
INTRAVENOUS | Status: AC
  Filled 2014-08-28: qty 1

## 2014-08-28 MED ORDER — ACETAMINOPHEN 10 MG/ML IV SOLN
1000.0000 mg | Freq: Four times a day (QID) | INTRAVENOUS | Status: AC
  Administered 2014-08-28 – 2014-08-29 (×4): 1000 mg via INTRAVENOUS
  Filled 2014-08-28 (×4): qty 100

## 2014-08-28 MED ORDER — ONDANSETRON HCL 4 MG/2ML IJ SOLN
INTRAMUSCULAR | Status: AC
  Filled 2014-08-28: qty 2

## 2014-08-28 MED ORDER — LIDOCAINE HCL (CARDIAC) 20 MG/ML IV SOLN
INTRAVENOUS | Status: AC
  Filled 2014-08-28: qty 5

## 2014-08-28 MED ORDER — HYDROMORPHONE 0.3 MG/ML IV SOLN
INTRAVENOUS | Status: AC
  Filled 2014-08-28: qty 25

## 2014-08-28 MED ORDER — LIDOCAINE HCL (CARDIAC) 20 MG/ML IV SOLN
INTRAVENOUS | Status: DC | PRN
  Administered 2014-08-28: 100 mg via INTRAVENOUS

## 2014-08-28 MED ORDER — NEOSTIGMINE METHYLSULFATE 10 MG/10ML IV SOLN
INTRAVENOUS | Status: DC | PRN
  Administered 2014-08-28: 2 mg via INTRAVENOUS
  Administered 2014-08-28: 3 mg via INTRAVENOUS

## 2014-08-28 MED ORDER — DIPHENHYDRAMINE HCL 12.5 MG/5ML PO ELIX
12.5000 mg | ORAL_SOLUTION | Freq: Four times a day (QID) | ORAL | Status: DC | PRN
  Filled 2014-08-28: qty 5

## 2014-08-28 MED ORDER — HYDROMORPHONE HCL 1 MG/ML IJ SOLN
0.5000 mg | INTRAMUSCULAR | Status: AC | PRN
  Administered 2014-08-28 (×4): 0.5 mg via INTRAVENOUS

## 2014-08-28 MED ORDER — NALOXONE HCL 0.4 MG/ML IJ SOLN
0.4000 mg | INTRAMUSCULAR | Status: DC | PRN
  Filled 2014-08-28: qty 1

## 2014-08-28 MED ORDER — HEPARIN (PORCINE) IN NACL 100-0.45 UNIT/ML-% IJ SOLN
3000.0000 [IU]/h | INTRAMUSCULAR | Status: AC
  Administered 2014-08-28 – 2014-08-31 (×6): 2950 [IU]/h via INTRAVENOUS
  Administered 2014-08-31 (×2): 3000 [IU]/h via INTRAVENOUS
  Filled 2014-08-28 (×16): qty 250

## 2014-08-28 MED ORDER — NITROGLYCERIN IN D5W 200-5 MCG/ML-% IV SOLN
INTRAVENOUS | Status: DC | PRN
  Administered 2014-08-28: 15 ug/min via INTRAVENOUS

## 2014-08-28 MED ORDER — GLYCOPYRROLATE 0.2 MG/ML IJ SOLN
INTRAMUSCULAR | Status: AC
  Filled 2014-08-28: qty 2

## 2014-08-28 MED ORDER — SODIUM CHLORIDE 0.9 % IV BOLUS (SEPSIS)
500.0000 mL | Freq: Once | INTRAVENOUS | Status: AC
  Administered 2014-08-28: 500 mL via INTRAVENOUS

## 2014-08-28 MED ORDER — PHENYLEPHRINE HCL 10 MG/ML IJ SOLN
INTRAMUSCULAR | Status: DC | PRN
Start: 2014-08-28 — End: 2014-08-28
  Administered 2014-08-28 (×2): 80 ug via INTRAVENOUS

## 2014-08-28 MED ORDER — ACETAMINOPHEN 650 MG RE SUPP
650.0000 mg | RECTAL | Status: DC | PRN
  Filled 2014-08-28: qty 1

## 2014-08-28 MED ORDER — EPHEDRINE SULFATE 50 MG/ML IJ SOLN
INTRAMUSCULAR | Status: AC
  Filled 2014-08-28: qty 1

## 2014-08-28 MED ORDER — HYDROMORPHONE HCL 1 MG/ML IJ SOLN
INTRAMUSCULAR | Status: AC
  Filled 2014-08-28: qty 2

## 2014-08-28 MED ORDER — SUCCINYLCHOLINE CHLORIDE 20 MG/ML IJ SOLN
INTRAMUSCULAR | Status: DC | PRN
  Administered 2014-08-28: 120 mg via INTRAVENOUS

## 2014-08-28 MED ORDER — 0.9 % SODIUM CHLORIDE (POUR BTL) OPTIME
TOPICAL | Status: DC | PRN
  Administered 2014-08-28: 1000 mL

## 2014-08-28 MED ORDER — HYDROMORPHONE 0.3 MG/ML IV SOLN
INTRAVENOUS | Status: DC
  Administered 2014-08-28: 6 mg via INTRAVENOUS
  Administered 2014-08-28: 20:00:00 via INTRAVENOUS
  Administered 2014-08-28: 0.9 mg via INTRAVENOUS
  Administered 2014-08-28 – 2014-08-29 (×2): via INTRAVENOUS
  Administered 2014-08-29: 4.8 mg via INTRAVENOUS
  Administered 2014-08-29: 2.1 mg via INTRAVENOUS
  Administered 2014-08-29: 1.2 mg via INTRAVENOUS
  Administered 2014-08-29: 22:00:00 via INTRAVENOUS
  Administered 2014-08-29: 3.96 mg via INTRAVENOUS
  Administered 2014-08-29: 04:00:00 via INTRAVENOUS
  Administered 2014-08-29: 6.11 mg via INTRAVENOUS
  Administered 2014-08-30: 10:00:00 via INTRAVENOUS
  Administered 2014-08-30: 3.9 mg via INTRAVENOUS
  Administered 2014-08-30: 22:00:00 via INTRAVENOUS
  Administered 2014-08-30: 4.8 mg via INTRAVENOUS
  Administered 2014-08-31 (×2): via INTRAVENOUS
  Administered 2014-08-31: 0.3 mg via INTRAVENOUS
  Administered 2014-08-31: 3.3 mg via INTRAVENOUS
  Administered 2014-08-31: 4.75 mg via INTRAVENOUS
  Administered 2014-08-31: 2.7 mg via INTRAVENOUS
  Administered 2014-09-01: 3.6 mg via INTRAVENOUS
  Filled 2014-08-28 (×8): qty 25

## 2014-08-28 MED ORDER — SODIUM CHLORIDE 0.9 % IJ SOLN
9.0000 mL | INTRAMUSCULAR | Status: DC | PRN
  Administered 2014-08-28: 9 mL via INTRAVENOUS
  Filled 2014-08-28: qty 9

## 2014-08-28 MED ORDER — HYDROMORPHONE HCL 1 MG/ML IJ SOLN
0.2500 mg | INTRAMUSCULAR | Status: DC | PRN
  Administered 2014-08-28 (×4): 0.5 mg via INTRAVENOUS

## 2014-08-28 MED ORDER — DIPHENHYDRAMINE HCL 50 MG/ML IJ SOLN
12.5000 mg | Freq: Four times a day (QID) | INTRAMUSCULAR | Status: DC | PRN
  Filled 2014-08-28: qty 0.25

## 2014-08-28 MED ORDER — BUPIVACAINE-EPINEPHRINE 0.25% -1:200000 IJ SOLN
INTRAMUSCULAR | Status: DC | PRN
  Administered 2014-08-28: 20 mL

## 2014-08-28 MED ORDER — ALBUMIN HUMAN 5 % IV SOLN
INTRAVENOUS | Status: DC | PRN
  Administered 2014-08-28 (×2): via INTRAVENOUS

## 2014-08-28 MED ORDER — SODIUM CHLORIDE 0.9 % IJ SOLN
INTRAMUSCULAR | Status: AC
  Filled 2014-08-28: qty 10

## 2014-08-28 MED ORDER — PROPOFOL 10 MG/ML IV BOLUS
INTRAVENOUS | Status: AC
  Filled 2014-08-28: qty 20

## 2014-08-28 MED ORDER — LACTATED RINGERS IV SOLN
INTRAVENOUS | Status: DC | PRN
  Administered 2014-08-28: 09:00:00 via INTRAVENOUS

## 2014-08-28 MED ORDER — METOPROLOL TARTRATE 1 MG/ML IV SOLN
INTRAVENOUS | Status: AC
  Filled 2014-08-28: qty 10

## 2014-08-28 MED ORDER — BUPIVACAINE-EPINEPHRINE (PF) 0.5% -1:200000 IJ SOLN
INTRAMUSCULAR | Status: AC
  Filled 2014-08-28: qty 30

## 2014-08-28 MED ORDER — BUPIVACAINE-EPINEPHRINE (PF) 0.25% -1:200000 IJ SOLN
INTRAMUSCULAR | Status: AC
  Filled 2014-08-28: qty 30

## 2014-08-28 MED ORDER — DEXTROSE 5 % IV SOLN
10.0000 mg | INTRAVENOUS | Status: DC | PRN
  Administered 2014-08-28: 25 ug/min via INTRAVENOUS

## 2014-08-28 MED ORDER — NEOSTIGMINE METHYLSULFATE 10 MG/10ML IV SOLN
INTRAVENOUS | Status: AC
  Filled 2014-08-28: qty 1

## 2014-08-28 MED ORDER — PROPOFOL 10 MG/ML IV BOLUS
INTRAVENOUS | Status: DC | PRN
  Administered 2014-08-28: 120 mg via INTRAVENOUS

## 2014-08-28 MED ORDER — MIDAZOLAM HCL 2 MG/2ML IJ SOLN
INTRAMUSCULAR | Status: AC
  Filled 2014-08-28: qty 2

## 2014-08-28 MED ORDER — ROCURONIUM BROMIDE 100 MG/10ML IV SOLN
INTRAVENOUS | Status: DC | PRN
  Administered 2014-08-28: 20 mg via INTRAVENOUS
  Administered 2014-08-28: 40 mg via INTRAVENOUS
  Administered 2014-08-28: 10 mg via INTRAVENOUS
  Administered 2014-08-28: 20 mg via INTRAVENOUS

## 2014-08-28 MED ORDER — PHENYLEPHRINE 40 MCG/ML (10ML) SYRINGE FOR IV PUSH (FOR BLOOD PRESSURE SUPPORT)
PREFILLED_SYRINGE | INTRAVENOUS | Status: AC
  Filled 2014-08-28: qty 10

## 2014-08-28 MED ORDER — SODIUM CHLORIDE 0.9 % IR SOLN
Status: DC | PRN
  Administered 2014-08-28: 1

## 2014-08-28 MED ORDER — MIDAZOLAM HCL 5 MG/5ML IJ SOLN
INTRAMUSCULAR | Status: DC | PRN
  Administered 2014-08-28 (×2): 1 mg via INTRAVENOUS

## 2014-08-28 SURGICAL SUPPLY — 79 items
APPLIER CLIP ROT 10 11.4 M/L (STAPLE)
BLADE SURG ROTATE 9660 (MISCELLANEOUS) IMPLANT
CANISTER SUCTION 2500CC (MISCELLANEOUS) ×4 IMPLANT
CELLS DAT CNTRL 66122 CELL SVR (MISCELLANEOUS) IMPLANT
CHLORAPREP W/TINT 26ML (MISCELLANEOUS) ×4 IMPLANT
CLIP APPLIE ROT 10 11.4 M/L (STAPLE) IMPLANT
COVER MAYO STAND STRL (DRAPES) ×8 IMPLANT
COVER SURGICAL LIGHT HANDLE (MISCELLANEOUS) ×8 IMPLANT
CUTTER LINEAR ENDO 35 ART FLEX (STAPLE) ×4 IMPLANT
DRAPE LAPAROSCOPIC ABDOMINAL (DRAPES) ×4 IMPLANT
DRAPE PROXIMA HALF (DRAPES) ×8 IMPLANT
DRAPE UTILITY XL STRL (DRAPES) ×20 IMPLANT
DRAPE WARM FLUID 44X44 (DRAPE) ×4 IMPLANT
DRSG OPSITE POSTOP 4X10 (GAUZE/BANDAGES/DRESSINGS) IMPLANT
DRSG OPSITE POSTOP 4X8 (GAUZE/BANDAGES/DRESSINGS) IMPLANT
ELECT BLADE 6.5 EXT (BLADE) IMPLANT
ELECT CAUTERY BLADE 6.4 (BLADE) ×8 IMPLANT
ELECT REM PT RETURN 9FT ADLT (ELECTROSURGICAL) ×4
ELECTRODE REM PT RTRN 9FT ADLT (ELECTROSURGICAL) ×3 IMPLANT
GEL ULTRASOUND 20GR AQUASONIC (MISCELLANEOUS) IMPLANT
GLOVE BIO SURGEON STRL SZ 6.5 (GLOVE) ×4 IMPLANT
GLOVE BIOGEL PI IND STRL 7.0 (GLOVE) ×3 IMPLANT
GLOVE BIOGEL PI INDICATOR 7.0 (GLOVE) ×1
GLOVE SURG SIGNA 7.5 PF LTX (GLOVE) ×8 IMPLANT
GOWN STRL REUS W/ TWL LRG LVL3 (GOWN DISPOSABLE) ×6 IMPLANT
GOWN STRL REUS W/ TWL XL LVL3 (GOWN DISPOSABLE) ×6 IMPLANT
GOWN STRL REUS W/TWL LRG LVL3 (GOWN DISPOSABLE) ×2
GOWN STRL REUS W/TWL XL LVL3 (GOWN DISPOSABLE) ×2
KIT BASIN OR (CUSTOM PROCEDURE TRAY) ×4 IMPLANT
KIT ROOM TURNOVER OR (KITS) ×4 IMPLANT
LEGGING LITHOTOMY PAIR STRL (DRAPES) IMPLANT
LIGASURE IMPACT 36 18CM CVD LR (INSTRUMENTS) ×4 IMPLANT
NS IRRIG 1000ML POUR BTL (IV SOLUTION) ×8 IMPLANT
PACK GENERAL/GYN (CUSTOM PROCEDURE TRAY) ×4 IMPLANT
PAD ARMBOARD 7.5X6 YLW CONV (MISCELLANEOUS) ×8 IMPLANT
PENCIL BUTTON HOLSTER BLD 10FT (ELECTRODE) ×8 IMPLANT
RELOAD CUTTER ETS 35MM STAND (ENDOMECHANICALS) ×4 IMPLANT
RELOAD PROXIMATE 75MM BLUE (ENDOMECHANICALS) ×12 IMPLANT
RTRCTR WOUND ALEXIS 18CM MED (MISCELLANEOUS)
SCALPEL HARMONIC ACE (MISCELLANEOUS) ×4 IMPLANT
SCISSORS LAP 5X35 DISP (ENDOMECHANICALS) ×4 IMPLANT
SET IRRIG TUBING LAPAROSCOPIC (IRRIGATION / IRRIGATOR) IMPLANT
SLEEVE ENDOPATH XCEL 5M (ENDOMECHANICALS) ×4 IMPLANT
SPECIMEN JAR LARGE (MISCELLANEOUS) ×4 IMPLANT
SPECIMEN JAR X LARGE (MISCELLANEOUS) ×4 IMPLANT
SPONGE GAUZE 4X4 12PLY STER LF (GAUZE/BANDAGES/DRESSINGS) ×4 IMPLANT
SPONGE LAP 18X18 X RAY DECT (DISPOSABLE) ×8 IMPLANT
STAPLER GUN LINEAR PROX 60 (STAPLE) ×4 IMPLANT
STAPLER PROXIMATE 75MM BLUE (STAPLE) ×4 IMPLANT
STAPLER VISISTAT 35W (STAPLE) ×4 IMPLANT
SUCTION POOLE TIP (SUCTIONS) ×4 IMPLANT
SURGILUBE 2OZ TUBE FLIPTOP (MISCELLANEOUS) IMPLANT
SUT PDS AB 1 TP1 96 (SUTURE) ×8 IMPLANT
SUT PROLENE 1 CT (SUTURE) ×16 IMPLANT
SUT PROLENE 2 0 CT2 30 (SUTURE) IMPLANT
SUT PROLENE 2 0 KS (SUTURE) IMPLANT
SUT SILK 2 0 SH CR/8 (SUTURE) ×4 IMPLANT
SUT SILK 2 0 TIES 10X30 (SUTURE) ×8 IMPLANT
SUT SILK 3 0 SH CR/8 (SUTURE) ×12 IMPLANT
SUT SILK 3 0 TIES 10X30 (SUTURE) ×4 IMPLANT
SUT VIC AB 3-0 SH 8-18 (SUTURE) IMPLANT
SYR BULB IRRIGATION 50ML (SYRINGE) ×4 IMPLANT
SYS LAPSCP GELPORT 120MM (MISCELLANEOUS)
SYSTEM LAPSCP GELPORT 120MM (MISCELLANEOUS) IMPLANT
TAPE CLOTH SURG 4X10 WHT LF (GAUZE/BANDAGES/DRESSINGS) ×4 IMPLANT
TOWEL OR 17X26 10 PK STRL BLUE (TOWEL DISPOSABLE) ×8 IMPLANT
TRAY FOLEY CATH 14FRSI W/METER (CATHETERS) ×4 IMPLANT
TRAY FOLEY CATH 16FRSI W/METER (SET/KITS/TRAYS/PACK) ×4 IMPLANT
TRAY LAPAROSCOPIC (CUSTOM PROCEDURE TRAY) ×4 IMPLANT
TRAY PROCTOSCOPIC FIBER OPTIC (SET/KITS/TRAYS/PACK) IMPLANT
TROCAR XCEL 12X100 BLDLESS (ENDOMECHANICALS) IMPLANT
TROCAR XCEL BLUNT TIP 100MML (ENDOMECHANICALS) IMPLANT
TROCAR XCEL NON-BLD 11X100MML (ENDOMECHANICALS) IMPLANT
TROCAR XCEL NON-BLD 5MMX100MML (ENDOMECHANICALS) ×4 IMPLANT
TUBE CONNECTING 12X1/4 (SUCTIONS) ×8 IMPLANT
TUBING FILTER THERMOFLATOR (ELECTROSURGICAL) ×4 IMPLANT
TUBING INSUFFLATION (TUBING) IMPLANT
WATER STERILE IRR 1000ML POUR (IV SOLUTION) IMPLANT
YANKAUER SUCT BULB TIP NO VENT (SUCTIONS) ×8 IMPLANT

## 2014-08-28 NOTE — Progress Notes (Signed)
Principal Problem:   Perforation bowel Active Problems:   Intestinal perforation   Mesenteric vein thrombosis   HTN (hypertension)   Acute embolism and thrombosis of vein   CAD S/P percutaneous coronary angioplasty  49 yo male w/ hx SMV thrombus, s/p lysis by IR and tPA 01/05, NSTEMI after that w/ PTCA  to the RCA for ISR 01/12, planned Plavix for 30 days because he will need Pradaxa for anticoagulation. D/c 01/13.   Admitted 01/23 with small bowel perforation, Plavix d/c'd, on ASA 81 mg, heparin added. S/p APPENDECTOMY LAPAROSCOPIC SMALL BOWEL RESECTION LAPAROSCOPY DIAGNOSTIC 01/25.  Post-op Patient is doing well. He had IV Lopressor since getting back from the OR in his blood pressure/heart rate are improving. He has a Dilaudid PCA pump for pain control. Heparin was just restarted.  He denies shortness of breath or any pain besides incisional discomfort. He is tolerating ice chips PO.  BP 139/85 mmHg  Pulse 112  Temp(Src) 100.6 F (38.1 C) (Oral)  Resp 28  Ht 6\' 2"  (1.88 m)  Wt 267 lb 10.2 oz (121.4 kg)  BMI 34.35 kg/m2  SpO2 98% No signs or symptoms of overload by physical exam.  We'll continue current therapy and follow up in a.m.  Rosaria Ferries, PA-C 08/28/2014 6:07 PM Beeper [phone]   I have seen, examined and evaluated the patient this PM (post-op) along with Ms. Ahmed Prima.  After reviewing all the available data and chart,  I agree with her findings, examination as well as impression recommendations.  Seems as though the surgery went well w/o complications.  Currently pretty groggy, but no C/O CP.  For now - would continue BB (IV if NPO) - restart heparin per Sgx recommendations.   As soon as able - restart Plavix & ASA along with Pradaxa.  I asked the RN to go ahead & give IV Lopressor as ordered - early as BP & HR were elevated on exam.  Will see tomorrow for more recommendations.  [name], M.D., M.S. Interventional Cardiologist   Pager #  [phone]

## 2014-08-28 NOTE — Progress Notes (Signed)
Patient ID: Dylan [name], male   DOB: 01/21/1966, 49 y.o.   MRN: [id number]   He still has moderate to severe RLQ abdominal pain On exam, he has severe RLQ tenderness with guarding  I had a long discussion with the patient and his family.  Will plan surgery this morning as he has made no improvement.  Will plan laparoscopic, possible open bowel resection.  Heparin being stopped. I discussed the risks which includes but is not limited to bleeding, infection, injury to surrounding structures, need for further surgery, DVT, MI, post op recovery, etc. He agrees to proceed.

## 2014-08-28 NOTE — Op Note (Signed)
APPENDECTOMY LAPAROSCOPIC, SMALL BOWEL RESECTION, LAPAROSCOPY DIAGNOSTIC  Procedure Note  ROCKEY GUARINO 08/26/2014 - 08/28/2014   Pre-op Diagnosis: perforated small bowel     Post-op Diagnosis: perforated small bowel  Procedure(s): APPENDECTOMY LAPAROSCOPIC SMALL BOWEL RESECTION LAPAROSCOPY DIAGNOSTIC  Surgeon(s): [name], MD  Anesthesia: General  Staff:  Circulator: Cyd Silence, RN Scrub Person: Tomma Rakers Sipsis, RN; Jinny Blossom Day Cavanaugh, RN Circulator Assistant: Jaci Standard, RN  Estimated Blood Loss: 300 mL               Specimens: sent to path          [facility]   Date: 08/28/2014  Time: 11:34 AM

## 2014-08-28 NOTE — Anesthesia Preprocedure Evaluation (Addendum)
Anesthesia Evaluation  Patient identified by MRN, date of birth, ID band Patient awake    Reviewed: Allergy & Precautions, NPO status , Patient's Chart, lab work & pertinent test results  Airway Mallampati: II  TM Distance: >3 FB Neck ROM: Full    Dental   Pulmonary former smoker,          Cardiovascular hypertension, Pt. on medications and Pt. on home beta blockers + angina + CAD, + Past MI, + Cardiac Stents and + Peripheral Vascular Disease  NSTEMI with 08/15/14 PTCA of RCA stent thrombosis.   Neuro/Psych  Headaches, CVA    GI/Hepatic negative GI ROS, Neg liver ROS,   Endo/Other  Morbid obesity  Renal/GU ARFRenal disease     Musculoskeletal negative musculoskeletal ROS (+)   Abdominal   Peds  Hematology  (+) anemia , Hypercoagulable Anemic   Anesthesia Other Findings   Reproductive/Obstetrics                           Anesthesia Physical Anesthesia Plan  ASA: IV and emergent  Anesthesia Plan: General   Post-op Pain Management:    Induction: Intravenous  Airway Management Planned: Oral ETT  Additional Equipment: Arterial line  Intra-op Plan:   Post-operative Plan: Possible Post-op intubation/ventilation and Extubation in OR  Informed Consent: I have reviewed the patients History and Physical, chart, labs and discussed the procedure including the risks, benefits and alternatives for the proposed anesthesia with the patient or authorized representative who has indicated his/her understanding and acceptance.   Dental advisory given  Plan Discussed with: CRNA and Surgeon  Anesthesia Plan Comments: (Plan T&S, aline + standard monitors, Possible post-op intubation.)       Anesthesia Quick Evaluation

## 2014-08-28 NOTE — Anesthesia Postprocedure Evaluation (Signed)
  Anesthesia Post-op Note  Patient: Dylan [name]  Procedure(s) Performed: Procedure(s): APPENDECTOMY LAPAROSCOPIC (N/A) SMALL BOWEL RESECTION (N/A) LAPAROSCOPY DIAGNOSTIC  Patient Location: PACU  Anesthesia Type:General  Level of Consciousness: awake and alert   Airway and Oxygen Therapy: Patient Spontanous Breathing  Post-op Pain: none  Post-op Assessment: Post-op Vital signs reviewed and Patient's Cardiovascular Status Stable  Post-op Vital Signs: Reviewed  Last Vitals:  Filed Vitals:   08/28/14 1319  BP:   Pulse: 93  Temp:   Resp: 24    Complications: No apparent anesthesia complications

## 2014-08-28 NOTE — Progress Notes (Signed)
Utilization Review Completed.  

## 2014-08-28 NOTE — Progress Notes (Addendum)
ANTICOAGULATION CONSULT NOTE - Follow Up Consult  Pharmacy Consult for Heparin (Pradaxa on hold) Indication: SMV/PV thrombosis   Allergies  Allergen Reactions  . Ace Inhibitors Cough  . Lipitor [Atorvastatin] Other (See Comments)    myalgia  . Zofran [Ondansetron Hcl] Nausea And Vomiting  . Codeine Itching    Patient Measurements: Height: 6\' 2"  (188 cm) Weight: 267 lb 10.2 oz (121.4 kg) IBW/kg (Calculated) : 82.2  Vital Signs: Temp: 98 F (36.7 C) (01/25 0355) Temp Source: Oral (01/25 0355) BP: 114/64 mmHg (01/25 0355) Pulse Rate: 86 (01/25 0355)  Labs:  Recent Labs  08/25/14 1722 08/26/14 1248 08/26/14 1353 08/26/14 1835  08/27/14 0325 08/27/14 1310 08/27/14 1935 08/28/14 0300 08/28/14 0350  HGB 12.4* 11.7*  --   --   --  10.2*  --   --   --  10.0*  HCT 38.0* 35.6*  --   --   --  31.9*  --   --   --  31.5*  PLT 290 277  --   --   --  264  --   --   --  297  APTT  --   --   --   --   --  58*  --   --   --   --   LABPROT  --   --   --  16.7*  --  16.4*  --   --   --   --   INR  --   --   --  1.33  --  1.31  --   --   --   --   HEPARINUNFRC  --   --   --   --   < > <0.10* 0.25* 0.20* 0.24*  --   CREATININE 1.88* 1.75*  --   --   --  1.42*  --   --   --   --   TROPONINI  --   --  <0.03  --   --   --   --   --   --   --   < > = values in this interval not displayed.  Estimated Creatinine Clearance: 87.1 mL/min (by C-G formula based on Cr of 1.42).   Assessment: Sub-therapeutic heparin level, no issues per RN.   Goal of Therapy:  Heparin level 0.3-0.7 units/ml Monitor platelets by anticoagulation protocol: Yes   Plan:  -Increase heparin drip to 2950 units/hr -1200 HL -Daily CBC/HL -Monitor for bleeding -F/U OR plans  Narda Bonds 08/28/2014,4:48 AM  Addendum: Patient is s/p laparascopic appendectomy and small bowel resection. Pharmacy consulted to resume heparin infusion at 4 PM post surgery. HL this AM prior to surgery was sub-therapeutic at 0.24 on  heparin 2650 units/hr. H/H low but stable, Plt wnl.   Plan: -Resume heparin infusion at 2950 units/hr  -F/u 6 hr HL -Monitor daily HL, CBC and s/s of bleeding   Albertina Parr, PharmD., BCPS Clinical Pharmacist Pager [phone]

## 2014-08-28 NOTE — Progress Notes (Signed)
Ntg gtt off after arrival to pacu per Dr Benjamine Mola.

## 2014-08-28 NOTE — Progress Notes (Signed)
[facility] PROGRESS NOTE  Dylan [name] QPY:[id number] DOB: 1966/07/31 DOA: 08/26/2014 PCP: [name], MD Interim summary: Dylan [name] is a 49 y.o. male with prior complicated h/o of SMV and portal vein thrombosis s/p catheter directed thrombolytic therapy, complicated by NSTEMI , found to have thrombosis of the distal RCA stent s/p successful PTCA and angio seal closure, CVA, prior CAD, hyperlipidemia, acute on chronic renal insufficiency, discharged from the hospital on 1/13, comes in for right lower quadrant abdominal pain associated with nausea, and loose bowel movements. In ED,  CT abdomen and pelvis revealed bowel perforation with mesenteric LAD.  Assessment/Plan:                                     Bowel perforation with peritonitis: On IV antibiotics, IV fluids and pain control. Surgery on board.  c diff pcr positive, started the patient on IV flagyl.  He underwent bowel resection, appendectomy  Today. Pain control.    CAD s/p PTCA: IV heparin. Plan to resume aspirin and plavix.    Tachycardia: resume IV metoprolol.     DVT prophylaxis.   Code Status: full  Family Communication: wife at bedside Disposition Plan: pending   Consultants:  Surgery   cardiology  Procedures:  none  Antibiotics:  Flagyl 1/24  Zosyn 1/24  HPI/Subjective: Severe pain in the abdomen and nausea, on pump.   Objective: Filed Vitals:   08/28/14 1614  BP: 139/85  Pulse: 112  Temp: 100.6 F (38.1 C)  Resp: 28    Intake/Output Summary (Last 24 hours) at 08/28/14 1841 Last data filed at 08/28/14 1617  Gross per 24 hour  Intake 3335.33 ml  Output   1650 ml  Net 1685.33 ml   Filed Weights   08/26/14 2055  Weight: 121.4 kg (267 lb 10.2 oz)    Exam:   General:  Lethargic after surgery  Cardiovascular: s1s2, tachycardic  Respiratory: diminished at bases  Abdomen: soft severe, tenderness in the right lower quadrant, bowel sounds decreased.    Musculoskeletal: no pedal edema.   Data Reviewed: Basic Metabolic Panel:  Recent Labs Lab 08/25/14 1722 08/26/14 1248 08/27/14 0325  NA 133* 128* 135  K 3.7 4.0 3.8  CL 94* 89* 97  CO2 32 29 28  GLUCOSE 127* 136* 111*  BUN 22 27* 18  CREATININE 1.88* 1.75* 1.42*  CALCIUM 8.9 8.7 8.0*   Liver Function Tests:  Recent Labs Lab 08/25/14 1722 08/26/14 1248 08/27/14 0325  AST 32 26 19  ALT 35 28 21  ALKPHOS 78 70 59  BILITOT 1.1 1.2 1.1  PROT 6.6 6.5 5.2*  ALBUMIN 3.2* 3.0* 2.3*    Recent Labs Lab 08/26/14 1248  LIPASE 70*   No results for input(s): AMMONIA in the last 168 hours. CBC:  Recent Labs Lab 08/25/14 1722 08/26/14 1248 08/27/14 0325 08/28/14 0350 08/28/14 1242  WBC 9.0 9.3 7.9 5.7 5.5  NEUTROABS  --  5.7  --   --   --   HGB 12.4* 11.7* 10.2* 10.0* 11.0*  HCT 38.0* 35.6* 31.9* 31.5* 34.8*  MCV 87.6 87.3 87.9 89.5 89.0  PLT 290 277 264 297 303   Cardiac Enzymes:  Recent Labs Lab 08/26/14 1353  TROPONINI <0.03   BNP (last 3 results) No results for input(s): PROBNP in the last 8760 hours. CBG: No results for input(s): GLUCAP in the last 168 hours.  Recent  Results (from the past 240 hour(s))  Culture, blood (routine x 2)     Status: None (Preliminary result)   Collection Time: 08/25/14  5:47 PM  Result Value Ref Range Status   Specimen Description BLOOD ARM LEFT  Final   Special Requests BOTTLES DRAWN AEROBIC AND ANAEROBIC 10CC  Final   Culture   Final           BLOOD CULTURE RECEIVED NO GROWTH TO DATE CULTURE WILL BE HELD FOR 5 DAYS BEFORE ISSUING A FINAL NEGATIVE REPORT Performed at [facility]    Report Status PENDING  Incomplete  Culture, blood (routine x 2)     Status: None (Preliminary result)   Collection Time: 08/25/14  5:58 PM  Result Value Ref Range Status   Specimen Description BLOOD ARM RIGHT  Final   Special Requests   Final    BOTTLES DRAWN AEROBIC AND ANAEROBIC BLUE 10CC RED 5CC   Culture   Final            BLOOD CULTURE RECEIVED NO GROWTH TO DATE CULTURE WILL BE HELD FOR 5 DAYS BEFORE ISSUING A FINAL NEGATIVE REPORT Performed at [facility]    Report Status PENDING  Incomplete  Culture, blood (routine x 2)     Status: None (Preliminary result)   Collection Time: 08/26/14 10:00 PM  Result Value Ref Range Status   Specimen Description BLOOD LEFT HAND  Final   Special Requests BOTTLES DRAWN AEROBIC AND ANAEROBIC 10CC EA  Final   Culture   Final           BLOOD CULTURE RECEIVED NO GROWTH TO DATE CULTURE WILL BE HELD FOR 5 DAYS BEFORE ISSUING A FINAL NEGATIVE REPORT Performed at [facility]    Report Status PENDING  Incomplete  Culture, blood (routine x 2)     Status: None (Preliminary result)   Collection Time: 08/26/14 10:00 PM  Result Value Ref Range Status   Specimen Description BLOOD RIGHT ANTECUBITAL  Final   Special Requests BOTTLES DRAWN AEROBIC AND ANAEROBIC 10CC EA  Final   Culture   Final           BLOOD CULTURE RECEIVED NO GROWTH TO DATE CULTURE WILL BE HELD FOR 5 DAYS BEFORE ISSUING A FINAL NEGATIVE REPORT Performed at [facility]    Report Status PENDING  Incomplete  MRSA PCR Screening     Status: None   Collection Time: 08/26/14 10:20 PM  Result Value Ref Range Status   MRSA by PCR NEGATIVE NEGATIVE Final    Comment:        The GeneXpert MRSA Assay (FDA approved for NASAL specimens only), is one component of a comprehensive MRSA colonization surveillance program. It is not intended to diagnose MRSA infection nor to guide or monitor treatment for MRSA infections.   Clostridium Difficile by PCR     Status: Abnormal   Collection Time: 08/27/14  7:59 AM  Result Value Ref Range Status   C difficile by pcr POSITIVE (A) NEGATIVE Final    Comment: CRITICAL RESULT CALLED TO, READ BACK BY AND VERIFIED WITH: B.[email or web address] RN [id number] 08/27/14 E.[email or web address]      Studies: Dg Abd Portable 1v  08/27/2014   CLINICAL DATA:  Small bowel ischemia  EXAM: PORTABLE  ABDOMEN - 1 VIEW  COMPARISON:  08/26/2014  FINDINGS: There has been progression of enteric contrast material from the small bowel into the colon and up to the rectum. A few air-filled loops of small  bowel are identified within the left upper quadrant and right lower quadrant of the abdomen. No significant small bowel dilatation noted.  IMPRESSION: 1. Progression of enteric contrast material up to the level of the rectum.   Electronically Signed   By: Kerby Moors M.D.   On: 08/27/2014 07:40    Scheduled Meds: . aspirin  81 mg Oral Daily  . HYDROmorphone      . HYDROmorphone      . HYDROmorphone      . HYDROmorphone PCA 0.3 mg/mL   Intravenous 6 times per day  . HYDROmorphone PCA 0.3 mg/mL      . metoprolol  2.5 mg Intravenous 3 times per day  . metronidazole  500 mg Intravenous 3 times per day  . piperacillin-tazobactam (ZOSYN)  IV  3.375 g Intravenous 3 times per day   Continuous Infusions: . sodium chloride 75 mL/hr at 08/28/14 0400  . heparin 2,950 Units/hr (08/28/14 1645)    Principal Problem:   Perforation bowel Active Problems:   HTN (hypertension)   Acute embolism and thrombosis of vein   Mesenteric vein thrombosis   CAD S/P percutaneous coronary angioplasty   Intestinal perforation    Time spent: 25 minutes    [facility] Pager [phone] If 7PM-7AM, please contact night-coverage at [email or web address], password [facility] 08/28/2014, 6:41 PM  LOS: 2 days

## 2014-08-28 NOTE — Transfer of Care (Signed)
Immediate Anesthesia Transfer of Care Note  Patient: Dylan [name]  Procedure(s) Performed: Procedure(s): APPENDECTOMY LAPAROSCOPIC (N/A) SMALL BOWEL RESECTION (N/A) LAPAROSCOPY DIAGNOSTIC  Patient Location: PACU  Anesthesia Type:General  Level of Consciousness: awake, alert , oriented and patient cooperative  Airway & Oxygen Therapy: Patient Spontanous Breathing and Patient connected to face mask oxygen  Post-op Assessment: Report given to PACU RN, Post -op Vital signs reviewed and stable and Patient moving all extremities  Post vital signs: Reviewed and stable  Complications: No apparent anesthesia complications

## 2014-08-29 ENCOUNTER — Encounter (HOSPITAL_COMMUNITY): Payer: Self-pay | Admitting: Surgery

## 2014-08-29 DIAGNOSIS — E785 Hyperlipidemia, unspecified: Secondary | ICD-10-CM

## 2014-08-29 DIAGNOSIS — I81 Portal vein thrombosis: Secondary | ICD-10-CM

## 2014-08-29 LAB — CBC
HEMATOCRIT: 34.9 % — AB (ref 39.0–52.0)
HEMOGLOBIN: 11.3 g/dL — AB (ref 13.0–17.0)
MCH: 29.2 pg (ref 26.0–34.0)
MCHC: 32.4 g/dL (ref 30.0–36.0)
MCV: 90.2 fL (ref 78.0–100.0)
Platelets: 312 10*3/uL (ref 150–400)
RBC: 3.87 MIL/uL — ABNORMAL LOW (ref 4.22–5.81)
RDW: 14 % (ref 11.5–15.5)
WBC: 6.6 10*3/uL (ref 4.0–10.5)

## 2014-08-29 LAB — BASIC METABOLIC PANEL
Anion gap: 4 — ABNORMAL LOW (ref 5–15)
BUN: 10 mg/dL (ref 6–23)
CALCIUM: 8 mg/dL — AB (ref 8.4–10.5)
CHLORIDE: 104 mmol/L (ref 96–112)
CO2: 30 mmol/L (ref 19–32)
Creatinine, Ser: 1.3 mg/dL (ref 0.50–1.35)
GFR calc Af Amer: 73 mL/min — ABNORMAL LOW (ref >90)
GFR calc non Af Amer: 63 mL/min — ABNORMAL LOW (ref >90)
Glucose, Bld: 114 mg/dL — ABNORMAL HIGH (ref 70–99)
POTASSIUM: 4.7 mmol/L (ref 3.5–5.1)
Sodium: 138 mmol/L (ref 135–145)

## 2014-08-29 LAB — HEPARIN LEVEL (UNFRACTIONATED): Heparin Unfractionated: 0.57 IU/mL (ref 0.30–0.70)

## 2014-08-29 MED ORDER — CETYLPYRIDINIUM CHLORIDE 0.05 % MT LIQD
7.0000 mL | Freq: Two times a day (BID) | OROMUCOSAL | Status: DC
  Administered 2014-08-29 – 2014-09-01 (×6): 7 mL via OROMUCOSAL

## 2014-08-29 MED ORDER — METOPROLOL TARTRATE 1 MG/ML IV SOLN
5.0000 mg | Freq: Three times a day (TID) | INTRAVENOUS | Status: DC
Start: 2014-08-30 — End: 2014-09-01
  Administered 2014-08-30 – 2014-09-01 (×7): 5 mg via INTRAVENOUS
  Filled 2014-08-29 (×9): qty 5

## 2014-08-29 NOTE — Progress Notes (Signed)
[facility] for Heparin  Indication: SMV/PV thrombosis   Allergies  Allergen Reactions  . Ace Inhibitors Cough  . Lipitor [Atorvastatin] Other (See Comments)    myalgia  . Zofran [Ondansetron Hcl] Nausea And Vomiting  . Codeine Itching    Patient Measurements: Height: 6\' 2"  (188 cm) Weight: 267 lb 10.2 oz (121.4 kg) IBW/kg (Calculated) : 82.2  Vital Signs: Temp: 102.3 F (39.1 C) (01/25 2000) Temp Source: Oral (01/25 2000) BP: 113/65 mmHg (01/25 2100) Pulse Rate: 128 (01/25 2100)  Labs:  Recent Labs  08/26/14 1248 08/26/14 1353 08/26/14 1835 08/27/14 0325  08/28/14 0300 08/28/14 0350 08/28/14 1242 08/28/14 1826 08/28/14 2251  HGB 11.7*  --   --  10.2*  --   --  10.0* 11.0*  --  12.0*  HCT 35.6*  --   --  31.9*  --   --  31.5* 34.8*  --  37.6*  PLT 277  --   --  264  --   --  297 303  --  337  APTT  --   --   --  58*  --   --   --   --   --   --   LABPROT  --   --  16.7* 16.4*  --   --   --   --   --   --   INR  --   --  1.33 1.31  --   --   --   --   --   --   HEPARINUNFRC  --   --   --  <0.10*  < > 0.24*  --   --  0.12* 0.30  CREATININE 1.75*  --   --  1.42*  --   --   --   --   --   --   TROPONINI  --  <0.03  --   --   --   --   --   --   --   --   < > = values in this interval not displayed.  Estimated Creatinine Clearance: 87.1 mL/min (by C-G formula based on Cr of 1.42).   Assessment: 49 yo male with h/o portal vein thrombosis Pradaxa on hold, s/p appendectomy and small resection 1/25, for anticoagulation  Goal of Therapy:  Heparin level 0.3-0.7 units/ml Monitor platelets by anticoagulation protocol: Yes   Plan:  Continue Heparin at current rate  Follow-up am labs.   Phillis Knack, PharmD, BCPS

## 2014-08-29 NOTE — Progress Notes (Signed)
49 y.o. male with history of inferior NSTEMI status post PCI to the mid RCA followed by redo PCI more proximally in the RCA in 2013 with treatment of proximal in-stent restenosis with a new stent proximally. He has been lost to follow-up for cardiology and has not been taking Plavix for over a year. He was admitted on January 1st 2016 for acute onset abdominal pain and was found to have a thrombosed superior mesenteric vein with extension into the portal vein. This was thought to be related to testosterone replacement. Treated with thrombolytic lysis VIR. Following this the patient developed acute onset of substernal chest pressure on the evening of the 10th/morning left of the 11th. He was diagnosed with NSTEMI. His cardiac catheterization was notable for RCA in-stent thrombosis with up to 70% luminal narrowing. He underwent PTCA with minimal amount of residual thrombus. Now pt admitted to the hospital with RUQ abdominal pain secondary to small bowel perforation --> s/p partial bowel Resection on 08/28/2013.  POD #1  Subjective:  Feels better this AM.  Doesn't like NGT.  Pain controlled.  No PO, flatus No CP or Dyspnea.   Objective:  Vital Signs in the last 24 hours: Temp:  [97.5 F (36.4 C)-102.3 F (39.1 C)] 97.5 F (36.4 C) (01/26 0412) Pulse Rate:  [74-132] 95 (01/26 0412) Resp:  [13-29] 18 (01/26 0416) BP: (99-151)/(53-87) 111/65 mmHg (01/26 0412) SpO2:  [93 %-100 %] 97 % (01/26 0416) Arterial Line BP: (151-166)/(44-85) 158/83 mmHg (01/25 1310)  Intake/Output from previous day: 01/25 0701 - 01/26 0700 In: 3502 [P.O.:240; I.V.:2532; NG/GT:30; IV Piggyback:700] Out: 2435 [Urine:1525; Emesis/NG output:560; Blood:350] Intake/Output from this shift:    Physical Exam: General appearance: alert, cooperative, appears stated age, no distress and mildly obese Neck: no adenopathy, no carotid bruit and no JVD Lungs: clear to auscultation bilaterally and normal percussion  bilaterally Heart: regular rate and rhythm, S1, S2 normal, no murmur, click, rub or gallop and normal apical impulse Abdomen: soft, appropriately tender post-op. Soft but present Bowel sounds Extremities: extremities normal, atraumatic, no cyanosis or edema Pulses: 2+ and symmetric Neurologic: Grossly normal  Lab Results:  Recent Labs  08/28/14 2251 08/29/14 0540  WBC 6.9 6.6  HGB 12.0* 11.3*  PLT 337 312    Recent Labs  08/26/14 1248 08/27/14 0325  NA 128* 135  K 4.0 3.8  CL 89* 97  CO2 29 28  GLUCOSE 136* 111*  BUN 27* 18  CREATININE 1.75* 1.42*    Recent Labs  08/26/14 1353  TROPONINI <0.03   Hepatic Function Panel  Recent Labs  08/27/14 0325  PROT 5.2*  ALBUMIN 2.3*  AST 19  ALT 21  ALKPHOS 59  BILITOT 1.1   No results for input(s): CHOL in the last 72 hours. No results for input(s): PROTIME in the last 72 hours.  Imaging: No new studies.  Cardiac Studies: Tele - SR/STach 90-110, PACs  Assessment/Plan:  Principal Problem:   Perforation bowel Active Problems:   HTN (hypertension)   Acute embolism and thrombosis of vein   Mesenteric vein thrombosis   CAD S/P percutaneous coronary angioplasty   Intestinal perforation   CAD - s/p PTCA of ISR/thrombosis this month in setting of NSTEMI (& AMV-PV thrombosis) -- all thought to be related to recent Testosterone injections. --> No active angina Sx.  Agree with IV heparin until taking PO --> once taking PO, would restart ASA,Plavix & Pradaxa if OK by Sgx.   ASA/Plavix x 1 month total given the  extent of In-stent thrombosis noted @ cath; then OK to stop ASA  Pradaxa for SMV thrombosis  Essential HTN - currently only on IV Lopressor --> HRs are in 90s & BP in 120-130 mmHg range.  If BP remains stable, can increase dose of Lopressor to 5 mg IV q 8hr.  (was on Toprol 50 mg @ home in addition to Benicar HCTZ, once taking PO, would start BB of as Lopresor 25 mg BID & convert back to Toprol)  Restart  furosemide & fenofibrate on d/c.     LOS: 3 days    Ingram Onnen W 08/29/2014, 7:57 AM

## 2014-08-29 NOTE — Progress Notes (Signed)
Pt. Has high fever of >102 cannot tolerate suppository tylenol and po since pt. Has NGT draining moderate amount and npo. Dr. Grandville Silos made aware with orders made. Tyleno Iv  Scheduled dose x24 started .  Will continue to monitor pt.

## 2014-08-29 NOTE — Progress Notes (Signed)
Patient ID: Dylan [name], male   DOB: 01-05-1966, 49 y.o.   MRN: [id number]     [facility]      [address] [place], [place] [address]    Phone: [phone] FAX: [phone]     Subjective: Febrile.  Found to be c diff positive.  BP stable.  Pain 5/10 without meds.  Adequate UOP.    Objective:  Vital signs:  Filed Vitals:   08/28/14 2100 08/29/14 0035 08/29/14 0412 08/29/14 0416  BP: 113/65  111/65   Pulse: 128  95   Temp:  99.3 F (37.4 C) 97.5 F (36.4 C)   TempSrc:  Oral Oral   Resp: 29 18 15 18   Height:      Weight:      SpO2: 93% 96% 95% 97%    Last BM Date: 08/28/14  Intake/Output   Yesterday:  01/25 0701 - 01/26 0700 In: 5462 [P.O.:240; I.V.:2532; NG/GT:30; IV Piggyback:700] Out: 2435 [Urine:1525; Emesis/NG output:560; Blood:350] This shift: I/O last 3 completed shifts: In: 4857.3 [P.O.:240; I.V.:3587.3; NG/GT:30; IV Piggyback:1000] Out: 2910 [Urine:2000; Emesis/NG output:560; Blood:350]     Physical Exam: General: Pt awake/alert/oriented x4 in no acute distress Chest: cta. No chest wall pain w good excursion CV:  Pulses intact.  Regular rhythm MS: Normal AROM mjr joints.  No obvious deformity Abdomen: Soft.  distended.  No active bowel sounds.  Midline dressing is c/d/i.  No evidence of peritonitis.  No incarcerated hernias. Ext:  SCDs BLE.  No mjr edema.  No cyanosis Skin: No petechiae / purpura   Problem List:   Principal Problem:   Perforation bowel Active Problems:   HTN (hypertension)   Acute embolism and thrombosis of vein   Mesenteric vein thrombosis   CAD S/P percutaneous coronary angioplasty   Intestinal perforation    Results:   Labs: Results for orders placed or performed during the hospital encounter of 08/26/14 (from the past 43 hour(s))  Clostridium Difficile by PCR     Status: Abnormal   Collection Time: 08/27/14  7:59 AM  Result Value Ref Range   C difficile by pcr  POSITIVE (A) NEGATIVE    Comment: CRITICAL RESULT CALLED TO, READ BACK BY AND VERIFIED WITH: B.RONCALLO RN [id number] 08/27/14 E.GADDY   Heparin level (unfractionated)     Status: Abnormal   Collection Time: 08/27/14  1:10 PM  Result Value Ref Range   Heparin Unfractionated 0.25 (L) 0.30 - 0.70 IU/mL    Comment:        IF HEPARIN RESULTS ARE BELOW EXPECTED VALUES, AND PATIENT DOSAGE HAS BEEN CONFIRMED, SUGGEST FOLLOW UP TESTING OF ANTITHROMBIN III LEVELS.   Heparin level (unfractionated)     Status: Abnormal   Collection Time: 08/27/14  7:35 PM  Result Value Ref Range   Heparin Unfractionated 0.20 (L) 0.30 - 0.70 IU/mL    Comment:        IF HEPARIN RESULTS ARE BELOW EXPECTED VALUES, AND PATIENT DOSAGE HAS BEEN CONFIRMED, SUGGEST FOLLOW UP TESTING OF ANTITHROMBIN III LEVELS.   Heparin level (unfractionated)     Status: Abnormal   Collection Time: 08/28/14  3:00 AM  Result Value Ref Range   Heparin Unfractionated 0.24 (L) 0.30 - 0.70 IU/mL    Comment:        IF HEPARIN RESULTS ARE BELOW EXPECTED VALUES, AND PATIENT DOSAGE HAS BEEN CONFIRMED, SUGGEST FOLLOW UP TESTING OF ANTITHROMBIN III LEVELS.   CBC     Status: Abnormal  Collection Time: 08/28/14  3:50 AM  Result Value Ref Range   WBC 5.7 4.0 - 10.5 K/uL   RBC 3.52 (L) 4.22 - 5.81 MIL/uL   Hemoglobin 10.0 (L) 13.0 - 17.0 g/dL   HCT 31.5 (L) 39.0 - 52.0 %   MCV 89.5 78.0 - 100.0 fL   MCH 28.4 26.0 - 34.0 pg   MCHC 31.7 30.0 - 36.0 g/dL   RDW 14.2 11.5 - 15.5 %   Platelets 297 150 - 400 K/uL  Type and screen     Status: None   Collection Time: 08/28/14  9:15 AM  Result Value Ref Range   ABO/RH(D) B POS    Antibody Screen NEG    Sample Expiration 08/31/2014   ABO/Rh     Status: None   Collection Time: 08/28/14  9:15 AM  Result Value Ref Range   ABO/RH(D) B POS   CBC     Status: Abnormal   Collection Time: 08/28/14 12:42 PM  Result Value Ref Range   WBC 5.5 4.0 - 10.5 K/uL   RBC 3.91 (L) 4.22 - 5.81 MIL/uL    Hemoglobin 11.0 (L) 13.0 - 17.0 g/dL   HCT 34.8 (L) 39.0 - 52.0 %   MCV 89.0 78.0 - 100.0 fL   MCH 28.1 26.0 - 34.0 pg   MCHC 31.6 30.0 - 36.0 g/dL   RDW 14.1 11.5 - 15.5 %   Platelets 303 150 - 400 K/uL  Heparin level (unfractionated)     Status: Abnormal   Collection Time: 08/28/14  6:26 PM  Result Value Ref Range   Heparin Unfractionated 0.12 (L) 0.30 - 0.70 IU/mL    Comment:        IF HEPARIN RESULTS ARE BELOW EXPECTED VALUES, AND PATIENT DOSAGE HAS BEEN CONFIRMED, SUGGEST FOLLOW UP TESTING OF ANTITHROMBIN III LEVELS.   CBC     Status: Abnormal   Collection Time: 08/28/14 10:51 PM  Result Value Ref Range   WBC 6.9 4.0 - 10.5 K/uL   RBC 4.25 4.22 - 5.81 MIL/uL   Hemoglobin 12.0 (L) 13.0 - 17.0 g/dL   HCT 37.6 (L) 39.0 - 52.0 %   MCV 88.5 78.0 - 100.0 fL   MCH 28.2 26.0 - 34.0 pg   MCHC 31.9 30.0 - 36.0 g/dL   RDW 14.1 11.5 - 15.5 %   Platelets 337 150 - 400 K/uL  Heparin level (unfractionated)     Status: None   Collection Time: 08/28/14 10:51 PM  Result Value Ref Range   Heparin Unfractionated 0.30 0.30 - 0.70 IU/mL    Comment:        IF HEPARIN RESULTS ARE BELOW EXPECTED VALUES, AND PATIENT DOSAGE HAS BEEN CONFIRMED, SUGGEST FOLLOW UP TESTING OF ANTITHROMBIN III LEVELS.   Heparin level (unfractionated)     Status: None   Collection Time: 08/29/14  5:40 AM  Result Value Ref Range   Heparin Unfractionated 0.57 0.30 - 0.70 IU/mL    Comment:        IF HEPARIN RESULTS ARE BELOW EXPECTED VALUES, AND PATIENT DOSAGE HAS BEEN CONFIRMED, SUGGEST FOLLOW UP TESTING OF ANTITHROMBIN III LEVELS.   CBC     Status: Abnormal   Collection Time: 08/29/14  5:40 AM  Result Value Ref Range   WBC 6.6 4.0 - 10.5 K/uL   RBC 3.87 (L) 4.22 - 5.81 MIL/uL   Hemoglobin 11.3 (L) 13.0 - 17.0 g/dL   HCT 34.9 (L) 39.0 - 52.0 %   MCV 90.2 78.0 -  100.0 fL   MCH 29.2 26.0 - 34.0 pg   MCHC 32.4 30.0 - 36.0 g/dL   RDW 14.0 11.5 - 15.5 %   Platelets 312 150 - 400 K/uL    Imaging /  Studies: No results found.  Medications / Allergies:  Scheduled Meds: . acetaminophen  1,000 mg Intravenous 4 times per day  . aspirin  81 mg Oral Daily  . HYDROmorphone PCA 0.3 mg/mL   Intravenous 6 times per day  . metoprolol  2.5 mg Intravenous 3 times per day  . metronidazole  500 mg Intravenous 3 times per day  . piperacillin-tazobactam (ZOSYN)  IV  3.375 g Intravenous 3 times per day   Continuous Infusions: . sodium chloride 75 mL/hr at 08/29/14 0700  . heparin 2,950 Units/hr (08/29/14 0700)   PRN Meds:.acetaminophen, diphenhydrAMINE **OR** diphenhydrAMINE, HYDROmorphone (DILAUDID) injection, naloxone **AND** sodium chloride, ondansetron (ZOFRAN) IV, promethazine  Antibiotics: Anti-infectives    Start     Dose/Rate Route Frequency Ordered Stop   08/27/14 1200  vancomycin (VANCOCIN) IVPB 1000 mg/200 mL premix  Status:  Discontinued     1,000 mg200 mL/hr over 60 Minutes Intravenous Every 12 hours 08/26/14 2125 08/27/14 0759   08/27/14 0830  metroNIDAZOLE (FLAGYL) IVPB 500 mg     500 mg100 mL/hr over 60 Minutes Intravenous 3 times per day 08/27/14 0759     08/26/14 2300  vancomycin (VANCOCIN) 2,500 mg in sodium chloride 0.9 % 500 mL IVPB     2,500 mg250 mL/hr over 120 Minutes Intravenous  Once 08/26/14 2125 08/27/14 0050   08/26/14 2200  piperacillin-tazobactam (ZOSYN) IVPB 3.375 g     3.375 g12.5 mL/hr over 240 Minutes Intravenous 3 times per day 08/26/14 2125          Assessment/Plan: POD#1 laparoscopic appendectomy, SBR--Dr. Ninfa Linden C diff positive  -continue with NGT LWIS and bowel rest -zosyn/flagyl -PCA  -mobilize -IS -monitor closely for anastomotic leak  Erby Pian, ANP-BC [facility] Pager [phone](7A-4:30P) For consults and floor pages call [phone](7A-4:30P)  08/29/2014 7:43 AM

## 2014-08-29 NOTE — Progress Notes (Signed)
[facility] for Heparin  Indication: SMV/PV thrombosis   Allergies  Allergen Reactions  . Ace Inhibitors Cough  . Lipitor [Atorvastatin] Other (See Comments)    myalgia  . Zofran [Ondansetron Hcl] Nausea And Vomiting  . Codeine Itching    Patient Measurements: Height: 6\' 2"  (188 cm) Weight: 267 lb 10.2 oz (121.4 kg) IBW/kg (Calculated) : 82.2  Vital Signs: Temp: 98.3 F (36.8 C) (01/26 0842) Temp Source: Oral (01/26 0842) BP: 125/74 mmHg (01/26 0842) Pulse Rate: 95 (01/26 0842)  Labs:  Recent Labs  08/26/14 1248 08/26/14 1353 08/26/14 1835 08/27/14 0325  08/28/14 1242 08/28/14 1826 08/28/14 2251 08/29/14 0540  HGB 11.7*  --   --  10.2*  < > 11.0*  --  12.0* 11.3*  HCT 35.6*  --   --  31.9*  < > 34.8*  --  37.6* 34.9*  PLT 277  --   --  264  < > 303  --  337 312  APTT  --   --   --  58*  --   --   --   --   --   LABPROT  --   --  16.7* 16.4*  --   --   --   --   --   INR  --   --  1.33 1.31  --   --   --   --   --   HEPARINUNFRC  --   --   --  <0.10*  < >  --  0.12* 0.30 0.57  CREATININE 1.75*  --   --  1.42*  --   --   --   --   --   TROPONINI  --  <0.03  --   --   --   --   --   --   --   < > = values in this interval not displayed.  Estimated Creatinine Clearance: 87.1 mL/min (by C-G formula based on Cr of 1.42).   Assessment: 49 yo male with h/o portal vein thrombosis Pradaxa on hold, s/p appendectomy and small resection 1/25, for anticoagulation. Currently tolerating a few oral medications but surgery wants to hold off on resuming Plavix and Pradaxa. Currently on heparin infusion at 2950 units/hr. HL this AM remained therapeutic. Hgb down slightly to 11.3 - likely post op. Plt remain stable.   Goal of Therapy:  Heparin level 0.3-0.7 units/ml Monitor platelets by anticoagulation protocol: Yes   Plan:  Continue Heparin at current rate  Follow up daily HL, CBC, and s/s of bleeding   Albertina Parr, PharmD.,  BCPS Clinical Pharmacist Pager [phone]

## 2014-08-29 NOTE — Op Note (Signed)
NAMEBRANDIS, MATSUURA            ACCOUNT NO.:  [id number]  MEDICAL RECORD NO.:  [id number]  LOCATION:  [id number]                        FACILITY:  [facility]  PHYSICIAN:  [name], M.D. DATE OF BIRTH:  1966/03/18  DATE OF PROCEDURE:  08/28/2014 DATE OF DISCHARGE:                              OPERATIVE REPORT   PREOPERATIVE DIAGNOSIS:  Perforated small bowel.  POSTOPERATIVE DIAGNOSIS:  Perforated small bowel.  PROCEDURES: 1. Diagnostic laparoscopy. 2. Laparoscopic appendectomy. 3. Small bowel resection.  SURGEON:  [name], M.D.  ANESTHESIA:  General and 0.5% Marcaine with epinephrine.  ESTIMATED BLOOD LOSS:  300 mL.  INDICATIONS:  This is a 49 year old gentleman with a very significant past medical history including recent myocardial infarction as well as mesenteric thrombosis.  He has developed an area of perforation of his small bowel of uncertain etiology.  This has failed to improve, so a decision was made to proceed with surgery.  He again is at high risk from a cardiac standpoint.  FINDINGS:  The patient was found to have perforation of his ileum.  It was more than a foot from the cecum.  The etiology of perforation was uncertain.  The appendix was involved in the inflammatory process, but I do not believe this was the source of the perforation.  I did remove the appendix and resected small bowel.  At least several feet of small bowel and mesentery were quite woody from the inflammatory process.  I performed a side-to-side anastomosis of the distal small bowel.  PROCEDURE IN DETAIL:  The patient was brought to the operating and identified as Dylan [name].  He was placed supine on the operating table and general anesthesia was induced.  His abdomen was then prepped and draped in usual sterile fashion.  I made a transverse incision through his previous scar just above the umbilicus.  I took this down to fascia which was then opened with scalpel.   Hemostat was then used to pass through the peritoneal cavity under direct vision.  A 0 Vicryl pursestring suture was then placed around the fascial opening.  Hasson port was placed through the opening and insufflation of the abdomen was begun.  I then placed a 5-mm port in the patient's upper midline and another in the left lower quadrant, all under direct vision.  The patient was found to have several loops of small bowel stuck to the abdominal wall in the right lower quadrant which I took down bluntly. There was a previous perforation.  There was a bile-tinged fluid and a large amount of fibrinous exudate in this area.  As I was able to separate loops of small bowel became apparent as there was one area of distal small bowel where the perforation had occurred.  The appendix was actually stuck into this area as well.  I was able to free up small bowel loops from each other and also free up the appendix.  I decided to go ahead and proceed with an appendectomy and took down the mesoappendix with a Harmonic Scalpel and then transected the base with 2 separate firings of the GIA 75 stapler.  The appendix was then removed with an Endosac.  I had mobilized the small bowel  a fair distance from the area of perforation both proximally and distally.  The mesentery around the area was quite thickened.  At this point, I removed all trocars and made the incision in the fascia of the umbilicus larger.  I then was able to eviscerate the area of perforated small bowel.  I then transected small bowel proximal and distal to the area of perforation with a GIA 75 stapler.  The perforation itself was on the mesenteric border and appeared ischemic in nature.  I do not see any indications for perforation or from a foreign body.  I then took down the mesoappendix with ligature as well as several silk sutures.  Again, the entire mesentery was quite woody from the distal ileum proximally.  Again, the perforation  was more than a foot from the cecum, therefore decision was made to try to proceed with just a primary small bowel resection and anastomosis.  I resected small bowel proximal and distal to the area of perforation with GIA 75 stapler.  Again after taking down the mesentery, I then sent perforated bowel to the Pathology for evaluation.  I then performed a 2 separate enterotomies and performed a side-to-side anastomosis with the GIA 75 stapler.  I then closed the opening with a TA 60 stapler.  The bowel was quite woody and edematous, so I reinforced the staple line throughout with interrupted 3-0 silk sutures.  I then closed the mesenteric defect with interrupted 2-0 silk sutures.  The anastomosis appeared pink and well perfused, although the mesentery proximal and distal from many feet was quite woody and inflamed.  I then placed this back into the abdominal cavity.  I then thoroughly irrigated the abdomen with several liters of normal saline.  Hemostasis appeared to be achieved.  I then pulled the omentum down over the area of resection.  I then closed the patient's midline fascia with interrupted in figure-of-eight #1 Prolene sutures.  I then anesthetized the fascia with Marcaine and then closed the skin with staples after irrigating with saline.  The patient tolerated the procedure well.  All the counts were correct at the end of procedure.  The patient was then extubated in the operating room and taken in a guarded condition to the recovery room.     [name], M.D.     DB/MEDQ  D:  08/28/2014  T:  08/29/2014  Job:  [id number]

## 2014-08-29 NOTE — Progress Notes (Signed)
[facility] PROGRESS NOTE  Dylan [name]:[id number] DOB: 1966/07/27 DOA: 08/26/2014 PCP: [name], MD Interim summary: Dylan [name] is a 49 y.o. male with prior complicated h/o of SMV and portal vein thrombosis s/p catheter directed thrombolytic therapy, complicated by NSTEMI , found to have thrombosis of the distal RCA stent s/p successful PTCA and angio seal closure, CVA, prior CAD, hyperlipidemia, acute on chronic renal insufficiency, discharged from the hospital on 1/13, comes in for right lower quadrant abdominal pain associated with nausea, and loose bowel movements. In ED,  CT abdomen and pelvis revealed bowel perforation with mesenteric LAD. Stool sent for c diff came back positive and he was started on IV flagyl. He underwent laparoscopic appendectomy, small bowel resection by Dr Ninfa Linden on 1/25. He currently has NG tube o intermittent suction and on PCA pump for pain .   Assessment/Plan:                                     Bowel perforation with peritonitis: On IV antibiotics, IV fluids and pain control.  c diff pcr positive, started the patient on IV flagyl.  He underwent bowel resection, appendectomy by Dr Ninfa Linden on 1/25, patient doing better . Pain and nausea are better. Not able to pass flatulence.    CAD s/p PTCA:  on IV heparin and aspirin. Plan to resume plavix.   Tachycardia: improved. Increased the dose of IV metoprolol.    DVT prophylaxis.   Code Status: full  Family Communication: wife at bedside Disposition Plan: pending   Consultants:  Surgery   cardiology  Procedures:  Bowel resection and appendectomy on 1/25  Antibiotics:  Flagyl 1/24  Zosyn 1/24  HPI/Subjective: Pain has improved.   Objective: Filed Vitals:   08/29/14 0918  BP:   Pulse:   Temp:   Resp: 20    Intake/Output Summary (Last 24 hours) at 08/29/14 1102 Last data filed at 08/29/14 0933  Gross per 24 hour  Intake   3202 ml  Output   2460 ml   Net    742 ml   Filed Weights   08/26/14 2055  Weight: 121.4 kg (267 lb 10.2 oz)    Exam:   General:  Alert afebrile comfortable.   Cardiovascular: s1s2, tachycardic  Respiratory: diminished at bases  Abdomen: soft,  Tenderness genralized,but improved from yesterday and , bowel sounds decreased.  Bandage in place ,   Musculoskeletal: no pedal edema.   Data Reviewed: Basic Metabolic Panel:  Recent Labs Lab 08/25/14 1722 08/26/14 1248 08/27/14 0325 08/29/14 0940  NA 133* 128* 135 138  K 3.7 4.0 3.8 4.7  CL 94* 89* 97 104  CO2 32 29 28 30   GLUCOSE 127* 136* 111* 114*  BUN 22 27* 18 10  CREATININE 1.88* 1.75* 1.42* 1.30  CALCIUM 8.9 8.7 8.0* 8.0*   Liver Function Tests:  Recent Labs Lab 08/25/14 1722 08/26/14 1248 08/27/14 0325  AST 32 26 19  ALT 35 28 21  ALKPHOS 78 70 59  BILITOT 1.1 1.2 1.1  PROT 6.6 6.5 5.2*  ALBUMIN 3.2* 3.0* 2.3*    Recent Labs Lab 08/26/14 1248  LIPASE 70*   No results for input(s): AMMONIA in the last 168 hours. CBC:  Recent Labs Lab 08/26/14 1248 08/27/14 0325 08/28/14 0350 08/28/14 1242 08/28/14 2251 08/29/14 0540  WBC 9.3 7.9 5.7 5.5 6.9 6.6  NEUTROABS 5.7  --   --   --   --   --  HGB 11.7* 10.2* 10.0* 11.0* 12.0* 11.3*  HCT 35.6* 31.9* 31.5* 34.8* 37.6* 34.9*  MCV 87.3 87.9 89.5 89.0 88.5 90.2  PLT 277 264 297 303 337 312   Cardiac Enzymes:  Recent Labs Lab 08/26/14 1353  TROPONINI <0.03   BNP (last 3 results) No results for input(s): PROBNP in the last 8760 hours. CBG: No results for input(s): GLUCAP in the last 168 hours.  Recent Results (from the past 240 hour(s))  Culture, blood (routine x 2)     Status: None (Preliminary result)   Collection Time: 08/25/14  5:47 PM  Result Value Ref Range Status   Specimen Description BLOOD ARM LEFT  Final   Special Requests BOTTLES DRAWN AEROBIC AND ANAEROBIC 10CC  Final   Culture   Final           BLOOD CULTURE RECEIVED NO GROWTH TO DATE CULTURE WILL BE  HELD FOR 5 DAYS BEFORE ISSUING A FINAL NEGATIVE REPORT Performed at [facility]    Report Status PENDING  Incomplete  Culture, blood (routine x 2)     Status: None (Preliminary result)   Collection Time: 08/25/14  5:58 PM  Result Value Ref Range Status   Specimen Description BLOOD ARM RIGHT  Final   Special Requests   Final    BOTTLES DRAWN AEROBIC AND ANAEROBIC BLUE 10CC RED 5CC   Culture   Final           BLOOD CULTURE RECEIVED NO GROWTH TO DATE CULTURE WILL BE HELD FOR 5 DAYS BEFORE ISSUING A FINAL NEGATIVE REPORT Performed at [facility]    Report Status PENDING  Incomplete  Culture, blood (routine x 2)     Status: None (Preliminary result)   Collection Time: 08/26/14 10:00 PM  Result Value Ref Range Status   Specimen Description BLOOD LEFT HAND  Final   Special Requests BOTTLES DRAWN AEROBIC AND ANAEROBIC 10CC EA  Final   Culture   Final           BLOOD CULTURE RECEIVED NO GROWTH TO DATE CULTURE WILL BE HELD FOR 5 DAYS BEFORE ISSUING A FINAL NEGATIVE REPORT Performed at [facility]    Report Status PENDING  Incomplete  Culture, blood (routine x 2)     Status: None (Preliminary result)   Collection Time: 08/26/14 10:00 PM  Result Value Ref Range Status   Specimen Description BLOOD RIGHT ANTECUBITAL  Final   Special Requests BOTTLES DRAWN AEROBIC AND ANAEROBIC 10CC EA  Final   Culture   Final           BLOOD CULTURE RECEIVED NO GROWTH TO DATE CULTURE WILL BE HELD FOR 5 DAYS BEFORE ISSUING A FINAL NEGATIVE REPORT Performed at [facility]    Report Status PENDING  Incomplete  MRSA PCR Screening     Status: None   Collection Time: 08/26/14 10:20 PM  Result Value Ref Range Status   MRSA by PCR NEGATIVE NEGATIVE Final    Comment:        The GeneXpert MRSA Assay (FDA approved for NASAL specimens only), is one component of a comprehensive MRSA colonization surveillance program. It is not intended to diagnose MRSA infection nor to guide  or monitor treatment for MRSA infections.   Clostridium Difficile by PCR     Status: Abnormal   Collection Time: 08/27/14  7:59 AM  Result Value Ref Range Status   C difficile by pcr POSITIVE (A) NEGATIVE Final    Comment: CRITICAL  RESULT CALLED TO, READ BACK BY AND VERIFIED WITH: B.[email or web address] RN [id number] 08/27/14 E.GADDY      Studies: No results found.  Scheduled Meds: . acetaminophen  1,000 mg Intravenous 4 times per day  . aspirin  81 mg Oral Daily  . HYDROmorphone PCA 0.3 mg/mL   Intravenous 6 times per day  . metoprolol  2.5 mg Intravenous 3 times per day  . metronidazole  500 mg Intravenous 3 times per day  . piperacillin-tazobactam (ZOSYN)  IV  3.375 g Intravenous 3 times per day   Continuous Infusions: . sodium chloride 75 mL/hr at 08/29/14 0700  . heparin 2,950 Units/hr (08/29/14 0931)    Principal Problem:   Perforation bowel Active Problems:   HTN (hypertension)   Acute embolism and thrombosis of vein   Mesenteric vein thrombosis   CAD S/P percutaneous coronary angioplasty   Intestinal perforation    Time spent: 25 minutes    [facility] Pager [phone] If 7PM-7AM, please contact night-coverage at [email or web address], password [facility] 08/29/2014, 11:02 AM  LOS: 3 days

## 2014-08-30 DIAGNOSIS — R109 Unspecified abdominal pain: Secondary | ICD-10-CM

## 2014-08-30 LAB — CBC
HEMATOCRIT: 32.1 % — AB (ref 39.0–52.0)
HEMOGLOBIN: 10.2 g/dL — AB (ref 13.0–17.0)
MCH: 28.3 pg (ref 26.0–34.0)
MCHC: 31.8 g/dL (ref 30.0–36.0)
MCV: 89.2 fL (ref 78.0–100.0)
PLATELETS: 318 10*3/uL (ref 150–400)
RBC: 3.6 MIL/uL — ABNORMAL LOW (ref 4.22–5.81)
RDW: 14.1 % (ref 11.5–15.5)
WBC: 8.4 10*3/uL (ref 4.0–10.5)

## 2014-08-30 LAB — LIPASE, BLOOD: LIPASE: 38 U/L (ref 11–59)

## 2014-08-30 LAB — BASIC METABOLIC PANEL
Anion gap: 7 (ref 5–15)
BUN: 9 mg/dL (ref 6–23)
CALCIUM: 7.9 mg/dL — AB (ref 8.4–10.5)
CHLORIDE: 102 mmol/L (ref 96–112)
CO2: 28 mmol/L (ref 19–32)
Creatinine, Ser: 1.14 mg/dL (ref 0.50–1.35)
GFR calc Af Amer: 86 mL/min — ABNORMAL LOW (ref >90)
GFR calc non Af Amer: 74 mL/min — ABNORMAL LOW (ref >90)
GLUCOSE: 102 mg/dL — AB (ref 70–99)
Potassium: 4.7 mmol/L (ref 3.5–5.1)
Sodium: 137 mmol/L (ref 135–145)

## 2014-08-30 LAB — HEPARIN LEVEL (UNFRACTIONATED): HEPARIN UNFRACTIONATED: 0.34 [IU]/mL (ref 0.30–0.70)

## 2014-08-30 MED ORDER — CLOPIDOGREL BISULFATE 75 MG PO TABS
75.0000 mg | ORAL_TABLET | Freq: Every day | ORAL | Status: DC
  Administered 2014-08-30 – 2014-09-01 (×3): 75 mg via ORAL
  Filled 2014-08-30 (×4): qty 1

## 2014-08-30 NOTE — Progress Notes (Signed)
[place] for Heparin  Indication: SMV/PV thrombosis   Allergies  Allergen Reactions  . Ace Inhibitors Cough  . Lipitor [Atorvastatin] Other (See Comments)    myalgia  . Zofran [Ondansetron Hcl] Nausea And Vomiting  . Codeine Itching    Patient Measurements: Height: 6\' 2"  (188 cm) Weight: 267 lb 10.2 oz (121.4 kg) IBW/kg (Calculated) : 82.2  Vital Signs: Temp: 98.7 F (37.1 C) (01/27 0846) Temp Source: Axillary (01/27 0846) BP: 140/75 mmHg (01/27 0846) Pulse Rate: 96 (01/27 0846)  Labs:  Recent Labs  08/28/14 2251 08/29/14 0540 08/29/14 0940 08/30/14 0308  HGB 12.0* 11.3*  --  10.2*  HCT 37.6* 34.9*  --  32.1*  PLT 337 312  --  318  HEPARINUNFRC 0.30 0.57  --  0.34  CREATININE  --   --  1.30 1.14    Estimated Creatinine Clearance: 108.5 mL/min (by C-G formula based on Cr of 1.14).   Assessment: 49 yo male with h/o portal vein thrombosis Pradaxa on hold, s/p appendectomy and small resection 1/25, for anticoagulation. Currently tolerating a few oral medications including aspirin and Plavix. Currently on heparin infusion at 2950 units/hr. HL this AM remained therapeutic. Hgb down slightly to 10.2 - likely post op. Plt remain stable. RN reports no s/s of bleeding   Goal of Therapy:  Heparin level 0.3-0.7 units/ml Monitor platelets by anticoagulation protocol: Yes   Plan:  Continue Heparin at current rate  Follow up daily HL, CBC, and s/s of bleeding  F/U when surgery is ok to resume [place], PharmD., BCPS Clinical Pharmacist Pager [phone]

## 2014-08-30 NOTE — Progress Notes (Signed)
2 Days Post-Op  Subjective: POD#2 Passing flatus comfortable  Objective: Vital signs in last 24 hours: Temp:  [97.9 F (36.6 C)-99 F (37.2 C)] 98.5 F (36.9 C) (01/27 0353) Pulse Rate:  [95-116] 99 (01/27 0353) Resp:  [14-20] 18 (01/27 0400) BP: (119-132)/(69-79) 124/78 mmHg (01/27 0353) SpO2:  [96 %-100 %] 99 % (01/27 0400) Last BM Date: 08/28/14  Intake/Output from previous day: 01/26 0701 - 01/27 0700 In: 2826.5 [I.V.:2176.5; IV Piggyback:650] Out: 2285 [Urine:2015; Emesis/NG output:270] Intake/Output this shift:    Abdomen soft, incision clean without erythema  Lab Results:   Recent Labs  08/29/14 0540 08/30/14 0308  WBC 6.6 8.4  HGB 11.3* 10.2*  HCT 34.9* 32.1*  PLT 312 318   BMET  Recent Labs  08/29/14 0940 08/30/14 0308  NA 138 137  K 4.7 4.7  CL 104 102  CO2 30 28  GLUCOSE 114* 102*  BUN 10 9  CREATININE 1.30 1.14  CALCIUM 8.0* 7.9*   PT/INR No results for input(s): LABPROT, INR in the last 72 hours. ABG No results for input(s): PHART, HCO3 in the last 72 hours.  Invalid input(s): PCO2, PO2  Studies/Results: No results found.  Anti-infectives: Anti-infectives    Start     Dose/Rate Route Frequency Ordered Stop   08/27/14 1200  vancomycin (VANCOCIN) IVPB 1000 mg/200 mL premix  Status:  Discontinued     1,000 mg200 mL/hr over 60 Minutes Intravenous Every 12 hours 08/26/14 2125 08/27/14 0759   08/27/14 0830  metroNIDAZOLE (FLAGYL) IVPB 500 mg     500 mg100 mL/hr over 60 Minutes Intravenous 3 times per day 08/27/14 0759     08/26/14 2300  vancomycin (VANCOCIN) 2,500 mg in sodium chloride 0.9 % 500 mL IVPB     2,500 mg250 mL/hr over 120 Minutes Intravenous  Once 08/26/14 2125 08/27/14 0050   08/26/14 2200  piperacillin-tazobactam (ZOSYN) IVPB 3.375 g     3.375 g12.5 mL/hr over 240 Minutes Intravenous 3 times per day 08/26/14 2125        Assessment/Plan: s/p Procedure(s): APPENDECTOMY LAPAROSCOPIC (N/A) SMALL BOWEL RESECTION  (N/A) LAPAROSCOPY DIAGNOSTIC  I want to leave the NG in one more day given the amount of inflammation in the small bowel and with the anastomosis  LOS: 4 days    Ausencio Vaden A 08/30/2014

## 2014-08-30 NOTE — Progress Notes (Signed)
[facility]                                                                              Patient Demographics  Dylan [name], is a 49 y.o. male, DOB - May 20, 1966, XNT:[id number]  Admit date - 08/26/2014   Admitting Physician Hosie Poisson, MD  Outpatient Primary MD for the patient is [name], [name], MD  LOS - 4   Chief Complaint  Patient presents with  . Abdominal Pain      HPI on 08/26/2014 by Dr. Hosie Poisson Dylan [name] is a 49 y.o. 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 He was seen by his PCP yesterday and labs were drawn and sent home and asked to come back if pain worsens. He denies any other complaints. In ED, oral contrast CT abdomen and pelvis revealed bowel perforation with mesenteric LAD. Surgery was consulted by EDP and we were requested to admit the patient. Cardiology consulted pre and peri operative medication management. Pt reports taking meds , including plavix yesterday morning .    Assessment & Plan  Perforated Bowel with periotonitis/Cdifficle -General surgery consulted and appreciated -S/p resection, appendectomy -Continue NG tube with intermittent suction -Continue pain control with PCA pump -+flatulence -Continue Zosyn -C. Difficile PCR positive, continue flagyl  CAD  -S/p PTCA Jan 2016 (NSTEMI, AMV-PV thrombosis, thought to be secondary to testosterone injections) -Cardiology consulted and appreciated -Patient currently chest pain free -Continue heparin -once able to restart orals, will place him on Aspirin, Plavix, pradaxa if ok with surgery (aspirin  and Plavix will need to be continued for one month, then Plavix alone thereafter)  Hypertension/tachycardia -Continue IV Lopressor  Hyperlipidemia -Will need to resume an appropriate and Lovaza at discharge  Code Status: Full  Family Communication: None at bedside  Disposition Plan: Admitted  Time Spent in minutes   30 minutes  Procedures  Appendectomy, Small bowel resection, Laparoscopy     Consults   General surgery, Dr. Ninfa Linden [facility]  DVT Prophylaxis  Heparin  Lab Results  Component Value Date   PLT 318 08/30/2014    Medications  Scheduled Meds: . antiseptic oral rinse  7 mL Mouth Rinse BID  . aspirin  81 mg Oral Daily  . clopidogrel  75 mg Oral Daily  . HYDROmorphone PCA 0.3 mg/mL   Intravenous 6 times per day  . metoprolol  5 mg Intravenous 3 times per day  . metronidazole  500 mg Intravenous 3 times per day  . piperacillin-tazobactam (ZOSYN)  IV  3.375 g Intravenous 3 times per day   Continuous Infusions: . sodium chloride 75 mL/hr (08/29/14 1400)  . heparin 2,950 Units/hr (08/30/14 1100)   PRN Meds:.acetaminophen, diphenhydrAMINE **OR** diphenhydrAMINE, HYDROmorphone (DILAUDID) injection, naloxone **AND** sodium chloride, ondansetron (ZOFRAN) IV, promethazine  Antibiotics    Anti-infectives    Start  Dose/Rate Route Frequency Ordered Stop   08/27/14 1200  vancomycin (VANCOCIN) IVPB 1000 mg/200 mL premix  Status:  Discontinued     1,000 mg200 mL/hr over 60 Minutes Intravenous Every 12 hours 08/26/14 2125 08/27/14 0759   08/27/14 0830  metroNIDAZOLE (FLAGYL) IVPB 500 mg     500 mg100 mL/hr over 60 Minutes Intravenous 3 times per day 08/27/14 0759     08/26/14 2300  vancomycin (VANCOCIN) 2,500 mg in sodium chloride 0.9 % 500 mL IVPB     2,500 mg250 mL/hr over 120 Minutes Intravenous  Once 08/26/14 2125 08/27/14 0050   08/26/14 2200  piperacillin-tazobactam (ZOSYN) IVPB 3.375 g     3.375 g12.5 mL/hr over 240 Minutes Intravenous 3 times per day  08/26/14 2125         Subjective:   Dylan [name] seen and examined today.  Patient states he is feeling better and everyone keeps telling him that he is improving.  Denies pain at this time. Would like to have the NG tube removed. States he has passed gas, but no bowel movement. Denies chest pain or shortness of breath.   Objective:   Filed Vitals:   08/30/14 0400 08/30/14 0800 08/30/14 0846 08/30/14 1018  BP:   140/75   Pulse:   96   Temp:   98.7 F (37.1 C)   TempSrc:   Axillary   Resp: 18 16 14 16   Height:      Weight:      SpO2: 99% 99% 98% 99%    Wt Readings from Last 3 Encounters:  08/26/14 121.4 kg (267 lb 10.2 oz)  08/16/14 131.7 kg (290 lb 5.5 oz)  04/19/13 129.4 kg (285 lb 4.4 oz)     Intake/Output Summary (Last 24 hours) at 08/30/14 1230 Last data filed at 08/30/14 1200  Gross per 24 hour  Intake 3310.37 ml  Output   2035 ml  Net 1275.37 ml    Exam  General: Well developed, well nourished, NAD, appears stated age  16: NCAT,mucous membranes moist. NGtube  Cardiovascular: S1 S2 auscultated, no rubs, murmurs or gallops. Regular rate and rhythm.  Respiratory: Clear to auscultation bilaterally with equal chest rise  Abdomen: Soft, mildly tender, + bowel sounds, clean bandages  Extremities: warm dry without cyanosis clubbing or edema  Neuro: AAOx3, nonfocal  Psych: Normal affect and demeanor with intact judgement and insight  Data Review   Micro Results Recent Results (from the past 240 hour(s))  Culture, blood (routine x 2)     Status: None (Preliminary result)   Collection Time: 08/25/14  5:47 PM  Result Value Ref Range Status   Specimen Description BLOOD ARM LEFT  Final   Special Requests BOTTLES DRAWN AEROBIC AND ANAEROBIC 10CC  Final   Culture   Final           BLOOD CULTURE RECEIVED NO GROWTH TO DATE CULTURE WILL BE HELD FOR 5 DAYS BEFORE ISSUING A FINAL NEGATIVE REPORT Performed at [facility]    Report Status PENDING   Incomplete  Culture, blood (routine x 2)     Status: None (Preliminary result)   Collection Time: 08/25/14  5:58 PM  Result Value Ref Range Status   Specimen Description BLOOD ARM RIGHT  Final   Special Requests   Final    BOTTLES DRAWN AEROBIC AND ANAEROBIC BLUE 10CC RED 5CC   Culture   Final           BLOOD CULTURE RECEIVED NO GROWTH TO  DATE CULTURE WILL BE HELD FOR 5 DAYS BEFORE ISSUING A FINAL NEGATIVE REPORT Performed at [facility]    Report Status PENDING  Incomplete  Culture, blood (routine x 2)     Status: None (Preliminary result)   Collection Time: 08/26/14 10:00 PM  Result Value Ref Range Status   Specimen Description BLOOD LEFT HAND  Final   Special Requests BOTTLES DRAWN AEROBIC AND ANAEROBIC 10CC EA  Final   Culture   Final           BLOOD CULTURE RECEIVED NO GROWTH TO DATE CULTURE WILL BE HELD FOR 5 DAYS BEFORE ISSUING A FINAL NEGATIVE REPORT Performed at [facility]    Report Status PENDING  Incomplete  Culture, blood (routine x 2)     Status: None (Preliminary result)   Collection Time: 08/26/14 10:00 PM  Result Value Ref Range Status   Specimen Description BLOOD RIGHT ANTECUBITAL  Final   Special Requests BOTTLES DRAWN AEROBIC AND ANAEROBIC 10CC EA  Final   Culture   Final           BLOOD CULTURE RECEIVED NO GROWTH TO DATE CULTURE WILL BE HELD FOR 5 DAYS BEFORE ISSUING A FINAL NEGATIVE REPORT Performed at [facility]    Report Status PENDING  Incomplete  MRSA PCR Screening     Status: None   Collection Time: 08/26/14 10:20 PM  Result Value Ref Range Status   MRSA by PCR NEGATIVE NEGATIVE Final    Comment:        The GeneXpert MRSA Assay (FDA approved for NASAL specimens only), is one component of a comprehensive MRSA colonization surveillance program. It is not intended to diagnose MRSA infection nor to guide or monitor treatment for MRSA infections.   Clostridium Difficile by PCR     Status: Abnormal   Collection Time:  08/27/14  7:59 AM  Result Value Ref Range Status   C difficile by pcr POSITIVE (A) NEGATIVE Final    Comment: CRITICAL RESULT CALLED TO, READ BACK BY AND VERIFIED WITH: B.RONCALLO RN [id number] 08/27/14 E.[email or web address]     Radiology Reports Ct Abdomen Pelvis Wo Contrast  08/26/2014   CLINICAL DATA:  Right upper quadrant pain. Nausea, vomiting and diarrhea. Pain for 2 days. Pain has been increasing.  EXAM: CT ABDOMEN AND PELVIS WITHOUT CONTRAST  TECHNIQUE: Multidetector CT imaging of the abdomen and pelvis was performed following the standard protocol without IV contrast.  COMPARISON:  CT, 08/07/2014  FINDINGS: There are significant inflammatory changes in the right lower quadrant surrounding the distal ileum and cecum. The appendix is not definitively seen. It may be hidden within free fluid in the right lower quadrant. Small bowel loops show no wall thickening and there are associated prominent mesenteric lymph nodes. A small amount of free fluid is associated with the inflammatory change in the right lower quadrant. Along 1 loop of the thick-walled, right lower quadrant small bowel there is extraluminal air. There is minimal extraluminal contrast along an area of apparent wall dehiscence.  Small right pleural effusion. There is dependent subsegmental atelectasis in the lower lobes, right greater than left. Heart is normal in size.  Dense material lies in the right liver lobe, new from the prior exam, reflecting the replaced endovascular coils. No other liver abnormality.  Spleen, gallbladder, pancreas, adrenal glands:  Normal.  2.9 cm low-density left renal mass with an associated calcification, unchanged. Is protrudes from the posterior midpole. No other renal abnormality. Normal ureters. Bladder is unremarkable.  Remainder the bowel is unremarkable.  No free air. Hernia mesh lies along the anterior lower abdominal wall.  Degenerative changes noted of the visualized spine. No osteoblastic or osteolytic lesions.   IMPRESSION: 1. There are significant inflammatory in the right lower quadrant centered on thick-walled loops of small bowel. One affected loop has a dehiscent wall where there is a small amount extraluminal contrast and air. A small amount of fluid is associated with the inflammatory right lower quadrant changes. Given recent endovascular procedures, the possibility of the abnormal small bowel loops being due to ischemia should be considered. The etiology could be infectious or inflammatory. 2. Appendix not visualized. Findings appear to be due to small bowel and not from a ruptured appendicitis.   Electronically Signed   By: Lajean Manes M.D.   On: 08/26/2014 17:20   Ct Abdomen Pelvis Wo Contrast  08/07/2014   CLINICAL DATA:  Generalized abdominal pain and cramping. Nausea and vomiting. Superior mesenteric vein thrombosis.  EXAM: CT ABDOMEN AND PELVIS WITHOUT CONTRAST  TECHNIQUE: Multidetector CT imaging of the abdomen and pelvis was performed following the standard protocol without IV contrast.  COMPARISON:  CT on 08/04/2014 and MRI on 08/05/2014  FINDINGS: Lower chest:  New small right pleural effusion.  Hepatobiliary:  No mass visualized on this non-contrast exam.  Pancreas: No mass or inflammatory process visualized on this non-contrast exam.  Spleen:  Within normal limits in size.  Adrenal Glands:  No mass identified.  Kidneys/Urinary tract: No evidence of urolithiasis or hydronephrosis. 3 cm complex cyst with mild mural calcifications in the posterior upper pole the left kidney is stable.  Stomach/Bowel/Peritoneum: Mild increase and mesenteric edema and mild ascites is seen since previous study. Persistent wall thickening is seen involving multiple small bowel loops in the right abdomen. In the setting of superior mesenteric vein thrombosis, this is suspicious for bowel ischemia. Increased dilatation of proximal small bowel is also seen. No evidence of pneumatosis or free air.  Vascular/Lymphatic: No  pathologically enlarged lymph nodes identified. Superior mesenteric vein remains distended, consistent with venous thrombosis as demonstrated on recent MRI.  Reproductive:  No mass or other significant abnormality noted.  Other:  None.  Musculoskeletal:  No suspicious bone lesions identified.  IMPRESSION: Persistent small bowel wall thickening in the right abdomen, suspicious for bowel ischemia in the setting of SMV thrombosis which was demonstrated on recent MRI.  Increased proximal small bowel dilatation, suspicious for small bowel obstruction.  Increase in diffuse mesenteric edema and ascites.  Critical Value/emergent results were called by telephone at the time of interpretation on 08/07/2014 at 2:37 pm to Dr. Louellen Molder , who verbally acknowledged these results.   Electronically Signed   By: Earle Gell M.D.   On: 08/07/2014 14:37   Ct Abdomen Pelvis Wo Contrast  08/04/2014   CLINICAL DATA:  Generalized abdomen pain, cramps, nausea, vomiting.  EXAM: CT ABDOMEN AND PELVIS WITHOUT CONTRAST  TECHNIQUE: Multidetector CT imaging of the abdomen and pelvis was performed following the standard protocol without IV contrast. There is oral contrast in the stomach only.  COMPARISON:  July 06, 2002  FINDINGS: The liver, spleen, pancreas, gallbladder, adrenal glands and right kidney are normal. There is no hydronephrosis bilaterally. There is a 2.3 x 3 cm slight low-density lesion in the posterior midpole left kidney with minimal rim calcification ; on the previous CT, a simple cysts was present in the same location. There is atherosclerosis of the abdominal aorta without aneurysmal dilatation. There is no  abdominal lymphadenopathy.  There are several loops of abnormal enlarged thick walled small bowel loops with surrounding inflammation and fluid in the right lower quadrant. There is no free air. The appendix is not definitely seen. The colon is normal.  Fluid-filled bladder is normal. Small amount of free fluid is  identified in the pelvis. The lung bases are clear. Degenerative joint changes of the spine are noted.  IMPRESSION: Several loops of abnormal enlarged thick walled small bowel loops with surrounding inflammation and fluid in the right lower quadrant. The findings are nonspecific but can be due to infectious or inflammatory etiology.   Electronically Signed   By: Abelardo Diesel M.D.   On: 08/04/2014 07:37   Dg Abd 1 View  08/25/2014   CLINICAL DATA:  Abdominal pain for 3 weeks.  EXAM: ABDOMEN - 1 VIEW  COMPARISON:  08/12/2014  FINDINGS: There are a few dilated loops of small bowel within the left upper quadrant of the abdomen which measure up to 3.9 cm. When compared with the previous exam the degree of bowel distention is significantly improved. Hernia mesh overlies the pelvis.  IMPRESSION: 1. A few persistent dilated loops of small bowel in the left upper quadrant.   Electronically Signed   By: Kerby Moors M.D.   On: 08/25/2014 18:25   Mr Abdomen Wo Contrast  08/05/2014   CLINICAL DATA:  Abdominal pain nausea and cramping. eval for mesenteric vein thrombosis. Pt is 300lbs and extremely claus.  EXAM: MRI/ MRA ABDOMEN WITHOUT CONTRAST  TECHNIQUE: Multiplanar, multiecho pulse sequences of the abdomen were obtained WITHOUT intravenous contrast. Angiographic images of abdomen were obtained using MRA technique WITHOUT intravenous contrast.Pre meds were given. Pt unable to stay awake for breath hold sequances. Was attemting to repeat axial ssfse lower in to the pelvis when pt became spastic and crawling out of the scanner. Unable to obtain any further imaging  : COMPARISON:  CT 08/04/2014 AND EARLIER STUDIES  FINDINGS: There is occlusive thrombus through the visualized superior mesenteric vein and main portal vein, terminating at the portal bifurcation. There may be small amount of nonocclusive thrombus extending into the left portal vein. Normal flow signal in the splenic vein.  Limited assessment of the abdominal  aorta and proximal visceral and renal branches grossly unremarkable. IVC is patent.  There is a small amount perihepatic ,perisplenic, right lower quadrant mesenteric, and pelvic ascites. No focal liver lesion identified. 2.6 cm fluid signal partially exophytic probable cyst from the interpolar region left kidney. Right kidney, adrenal glands, pancreas, and nondilated gallbladder unremarkable.  IMPRESSION:  1. Occlusive thrombus in superior mesenteric vein extending through the main portal vein. 2. Abdominal ascites 3. Left renal cyst.   Electronically Signed   By: Arne Cleveland M.D.   On: 08/05/2014 15:34   Mr Jodene Nam Abdomen Wo Contrast  08/05/2014   CLINICAL DATA:  Abdominal pain nausea and cramping. eval for mesenteric vein thrombosis. Pt is 300lbs and extremely claus.  EXAM: MRI/ MRA ABDOMEN WITHOUT CONTRAST  TECHNIQUE: Multiplanar, multiecho pulse sequences of the abdomen were obtained WITHOUT intravenous contrast. Angiographic images of abdomen were obtained using MRA technique WITHOUT intravenous contrast.Pre meds were given. Pt unable to stay awake for breath hold sequances. Was attemting to repeat axial ssfse lower in to the pelvis when pt became spastic and crawling out of the scanner. Unable to obtain any further imaging  : COMPARISON:  CT 08/04/2014 AND EARLIER STUDIES  FINDINGS: There is occlusive thrombus through the visualized superior mesenteric vein  and main portal vein, terminating at the portal bifurcation. There may be small amount of nonocclusive thrombus extending into the left portal vein. Normal flow signal in the splenic vein.  Limited assessment of the abdominal aorta and proximal visceral and renal branches grossly unremarkable. IVC is patent.  There is a small amount perihepatic ,perisplenic, right lower quadrant mesenteric, and pelvic ascites. No focal liver lesion identified. 2.6 cm fluid signal partially exophytic probable cyst from the interpolar region left kidney. Right kidney,  adrenal glands, pancreas, and nondilated gallbladder unremarkable.  IMPRESSION:  1. Occlusive thrombus in superior mesenteric vein extending through the main portal vein. 2. Abdominal ascites 3. Left renal cyst.   Electronically Signed   By: Arne Cleveland M.D.   On: 08/05/2014 15:34   Ir Angiogram Selective Each Additional Vessel  08/08/2014   CLINICAL DATA:  Superior mesenteric vein and portal vein DVT with bowel ischemia.  EXAM: IR ULTRASOUND GUIDANCE VASC ACCESS RIGHT; THROMBOECTOMY MECHANICAL VENOUS; IR INFUSION THROMBOL VENOUS INITIAL (MS); PORTAL VENOGRAPHY; ADDITIONAL ARTERIOGRAPHY  FLUOROSCOPY TIME:  8 min and 6 seconds.  MEDICATIONS AND MEDICAL HISTORY: Versed 4 mg, Fentanyl 175 mcg.  As antibiotic prophylaxis, Ancef was ordered pre-procedure and administered intravenously within one hour of incision.  ANESTHESIA/SEDATION: Moderate sedation time: 52 minutes  CONTRAST:  50 cc Omnipaque 300.  Carbon dioxide.  PROCEDURE: The procedure, risks, benefits, and alternatives were explained to the patient. Questions regarding the procedure were encouraged and answered. The patient understands and consents to the procedure.  The right flank was prepped with Betadine in a sterile fashion, and a sterile drape was applied covering the operative field. A sterile gown and sterile gloves were used for the procedure.  1% lidocaine was utilized for local infiltration. Under sonographic guidance, a 22 gauge Chiba needle was inserted into the peripheral aspect of the main right portal vein. Contrast was injected confirming needle position. The needle was removed over a 018 wire. The Accustick dilator was inserted. A small amount of contrast was injected. Carbon dioxide portal venography was performed.  The Accustick dilator was exchanged over the 3 J for a 5 [place] Kumpe the catheter. This was advanced into the lower portal vein and venography was performed. The catheter was advanced into the upper SMV and contrast was  injected. There is partially occlusive thrombus in the main portal vein and occlusive thrombus in the SMV.  The catheter was carefully advanced over a glidewire to the small venous branches of the distal ileum in the right lower quadrant. Contrast was injected demonstrating stasis of flow within the small branches adjacent to the intestines and complete occlusion within the larger more central branches.  The Kumpe the catheter was removed over a Rosen wire. A 6 French sheath was advanced over the wire to the upper main portal vein. The Angiojet was utilized to mechanically lysed the thrombus for 96 seconds.  Subsequently, a 90 cm 20 cm infusion length 5 French multi side-hole catheter was advanced over the [place] wire to the right lower quadrant. The catheter is positioned along side the thrombus extending from the main portal vein to the ileum. Lysis with 0.5 mg tPA was then instituted. Heparin protocol was resumed per peripheral access.  FINDINGS: Portal venography confirms partial occlusion of the main portal vein and complete occlusion of the SMV. Splenic vein is patent.  Subsequent venogram images confirmed occlusion of mesenteric Dylan branches to the right lower quadrant. Peripheral branches adjacent to the intestine are patent with stasis of  flow.  The neck series of images demonstrate placement of a 6 French sheath and multi side hole catheter along side the thrombus, extending from the main portal vein to the small mesenteric venous branches in the right lower quadrant.  COMPLICATIONS: None  IMPRESSION: Successful mechanical and pharmacological lysis of thrombus in the main portal vein, superior mesenteric vein, and venous branches in the right lower quadrant. TPA had 0.5 milligrams/hour will be instituted via multi side-hole catheter along side the thrombus.   Electronically Signed   By: Maryclare Bean M.D.   On: 08/08/2014 17:21   Ir Transhepatic Portogram Wo Hemo  08/08/2014   CLINICAL DATA:  Superior  mesenteric vein and portal vein DVT with bowel ischemia.  EXAM: IR ULTRASOUND GUIDANCE VASC ACCESS RIGHT; THROMBOECTOMY MECHANICAL VENOUS; IR INFUSION THROMBOL VENOUS INITIAL (MS); PORTAL VENOGRAPHY; ADDITIONAL ARTERIOGRAPHY  FLUOROSCOPY TIME:  8 min and 6 seconds.  MEDICATIONS AND MEDICAL HISTORY: Versed 4 mg, Fentanyl 175 mcg.  As antibiotic prophylaxis, Ancef was ordered pre-procedure and administered intravenously within one hour of incision.  ANESTHESIA/SEDATION: Moderate sedation time: 52 minutes  CONTRAST:  50 cc Omnipaque 300.  Carbon dioxide.  PROCEDURE: The procedure, risks, benefits, and alternatives were explained to the patient. Questions regarding the procedure were encouraged and answered. The patient understands and consents to the procedure.  The right flank was prepped with Betadine in a sterile fashion, and a sterile drape was applied covering the operative field. A sterile gown and sterile gloves were used for the procedure.  1% lidocaine was utilized for local infiltration. Under sonographic guidance, a 22 gauge Chiba needle was inserted into the peripheral aspect of the main right portal vein. Contrast was injected confirming needle position. The needle was removed over a 018 wire. The Accustick dilator was inserted. A small amount of contrast was injected. Carbon dioxide portal venography was performed.  The Accustick dilator was exchanged over the 3 J for a 5 [place] Kumpe the catheter. This was advanced into the lower portal vein and venography was performed. The catheter was advanced into the upper SMV and contrast was injected. There is partially occlusive thrombus in the main portal vein and occlusive thrombus in the SMV.  The catheter was carefully advanced over a glidewire to the small venous branches of the distal ileum in the right lower quadrant. Contrast was injected demonstrating stasis of flow within the small branches adjacent to the intestines and complete occlusion within the  larger more central branches.  The Kumpe the catheter was removed over a Rosen wire. A 6 French sheath was advanced over the wire to the upper main portal vein. The Angiojet was utilized to mechanically lysed the thrombus for 96 seconds.  Subsequently, a 90 cm 20 cm infusion length 5 French multi side-hole catheter was advanced over the [place] wire to the right lower quadrant. The catheter is positioned along side the thrombus extending from the main portal vein to the ileum. Lysis with 0.5 mg tPA was then instituted. Heparin protocol was resumed per peripheral access.  FINDINGS: Portal venography confirms partial occlusion of the main portal vein and complete occlusion of the SMV. Splenic vein is patent.  Subsequent venogram images confirmed occlusion of mesenteric Dylan branches to the right lower quadrant. Peripheral branches adjacent to the intestine are patent with stasis of flow.  The neck series of images demonstrate placement of a 6 French sheath and multi side hole catheter along side the thrombus, extending from the main portal vein to the small  mesenteric venous branches in the right lower quadrant.  COMPLICATIONS: None  IMPRESSION: Successful mechanical and pharmacological lysis of thrombus in the main portal vein, superior mesenteric vein, and venous branches in the right lower quadrant. TPA had 0.5 milligrams/hour will be instituted via multi side-hole catheter along side the thrombus.   Electronically Signed   By: Maryclare Bean M.D.   On: 08/08/2014 17:21   US Renal Port  08/05/2014   CLINICAL DATA:  Acute renal injury.  Hypertension.  EXAM: RENAL/URINARY TRACT ULTRASOUND COMPLETE  COMPARISON:  08/04/2014  FINDINGS: Right Kidney:  Length: 13.0 cm. Echogenicity within normal limits. No mass or hydronephrosis visualized.  Left Kidney:  Length: 12.3 cm. Echogenicity within normal limits. No hydronephrosis. 2.2 by 1.7 cm complex exophytic lesion of the left mid kidney, as shown on CT scan.  Bladder:  Appears  normal for degree of bladder distention.  Other: Small amount of ascites adjacent to the right hepatic lobe. 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  IMPRESSION: 1. 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 This could be confirmed with noncontrast 3D time-of-flight MRI of the mesenteric vasculature, if clinically warranted. 2. Small amount of ascites adjacent to the right hepatic lobe. 3. The 2.2 by 1.7 cm exophytic lesion of the left mid kidney is confirmed to be complex by ultrasound. I do note that there was a reasonably benign appearing cyst in this location on 07/06/2002. Although likely benign, the lesion is not completely specific on today's exam. These results were called by telephone at the time of interpretation on 08/05/2014 at 12:42 pm to Dr. Louellen Molder , who verbally acknowledged these results.   Electronically Signed   By: Sherryl Barters M.D.   On: 08/05/2014 12:46   Ir Pta Venous Right  08/11/2014   CLINICAL DATA:  Forty-eight hr portal vein and superior mesenteric vein lysis check.  EXAM: IR THROMB F/U EVAL ART/VEN FINAL DAY; THROMBOECTOMY MECHANICAL VENOUS; PTA VENOUS; IR EMBO VENOUS NOT HEMORR HEMANG INC GUIDE ROADMAPPING  FLUOROSCOPY TIME:  Sixteen minutes  MEDICATIONS AND MEDICAL HISTORY: Versed two mg, Fentanyl 100 mcg.  Additional Medications: None.  ANESTHESIA/SEDATION: Moderate sedation time: 60 minutes  CONTRAST:  50 cc Omnipaque 300  PROCEDURE: The procedure, risks, benefits, and alternatives were explained to the patient. Questions regarding the procedure were encouraged and answered. The patient understands and consents to the procedure.  The right flank was prepped with Betadine in a sterile fashion, and a sterile drape was applied covering the operative field. A  sterile gown and sterile gloves were used for the procedure.  Initially, an NG tube was placed. The tip was positioned in the body of the stomach. This resulted in significant decompression.  The inner wire of the infusion catheter was removed and contrast was injected for venography.  The catheter was retracted over a Rosen wire and the Angiojet was utilized through the persistent thrombus for 156 seconds.  Repeat venography through a Kumpe catheter was performed.  The Rosen wire was reinserted and a 5 mm balloon was utilized to dilate the thrombus within the superior mesenteric and portal veins.  Repeat venography demonstrates improvement.  A pigtail catheter was advanced over the wire to the main portal vein an utilizing twirl technique, additional mechanical thrombectomy was performed. Final venography was performed.  A copy catheter was advanced through the sheath and into the main portal vein. The catheter was retracted into the transhepatic tract. The tract was embolized with Gel-Foam slurry and several the 038 embolization coils. Two 4 mm coils and four 6 mm coils were utilized.  FINDINGS: Initial venography demonstrates improvement overall. The most significant finding was innumerable collateral venous structures now opacifying from the peripheral aspect of the superior mesenteric venous system. 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  After performing the Angiojet, angioplasty, and pigtail technique, there was again noted to be significant improvement after each subsequent intervention. There is some partial thrombus in the most inferior portal vein, but most of the portal venous thrombus had resolved. There is chronic thrombus along the superior mesenteric vein of but there is no a large channel through the thrombus.  COMPLICATIONS: None  IMPRESSION: Successful lysed cysts and thrombectomy of thrombus throughout the portal  vein and superior mesenteric vein. At the conclusion of the treatment, there are now multiple collateral venous structures within the periphery of the superior mesenteric venous system that are opacifying an allowing for decompression. There is chronic thrombus within the superior mesenteric vein, but there is now a channel with re- cannulization. In addition, most of the thrombus in the portal vein has resolved. The patient will be placed on anti coagulation in 3 hr.   Electronically Signed   By: Maryclare Bean M.D.   On: 08/11/2014 09:53   Ir Thrombect Veno Mech Mod Sed  08/11/2014   CLINICAL DATA:  Forty-eight hr portal vein and superior mesenteric vein lysis check.  EXAM: IR THROMB F/U EVAL ART/VEN FINAL DAY; THROMBOECTOMY MECHANICAL VENOUS; PTA VENOUS; IR EMBO VENOUS NOT HEMORR HEMANG INC GUIDE ROADMAPPING  FLUOROSCOPY TIME:  Sixteen minutes  MEDICATIONS AND MEDICAL HISTORY: Versed two mg, Fentanyl 100 mcg.  Additional Medications: None.  ANESTHESIA/SEDATION: Moderate sedation time: 60 minutes  CONTRAST:  50 cc Omnipaque 300  PROCEDURE: The procedure, risks, benefits, and alternatives were explained to the patient. Questions regarding the procedure were encouraged and answered. The patient understands and consents to the procedure.  The right flank was prepped with Betadine in a sterile fashion, and a sterile drape was applied covering the operative field. A sterile gown and sterile gloves were used for the procedure.  Initially, an NG tube was placed. The tip was positioned in the body of the stomach. This resulted in significant decompression.  The inner wire of the infusion catheter was removed and contrast was injected for venography.  The catheter was retracted over a Rosen wire and the Angiojet was utilized through the persistent thrombus for 156 seconds.  Repeat venography through a Kumpe catheter was performed.  The Rosen wire was reinserted and a 5 mm balloon was utilized to dilate the thrombus within the  superior mesenteric and portal veins.  Repeat venography demonstrates improvement.  A pigtail catheter was advanced over the wire to the main portal vein an utilizing twirl technique, additional mechanical thrombectomy was performed. Final venography was performed.  A copy catheter was advanced through the sheath and into the main portal vein. The catheter was retracted into the transhepatic tract. The tract was embolized with Gel-Foam slurry and several the 038 embolization coils. Two 4 mm coils and four 6 mm coils were utilized.  FINDINGS: Initial venography demonstrates improvement overall. The most significant finding was innumerable collateral venous structures now opacifying from the peripheral aspect of  the superior mesenteric venous system. 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  After performing the Angiojet, angioplasty, and pigtail technique, there was again noted to be significant improvement after each subsequent intervention. There is some partial thrombus in the most inferior portal vein, but most of the portal venous thrombus had resolved. There is chronic thrombus along the superior mesenteric vein of but there is no a large channel through the thrombus.  COMPLICATIONS: None  IMPRESSION: Successful lysed cysts and thrombectomy of thrombus throughout the portal vein and superior mesenteric vein. At the conclusion of the treatment, there are now multiple collateral venous structures within the periphery of the superior mesenteric venous system that are opacifying an allowing for decompression. There is chronic thrombus within the superior mesenteric vein, but there is now a channel with re- cannulization. In addition, most of the thrombus in the portal vein has resolved. The patient will be placed on anti coagulation in 3 hr.   Electronically Signed   By: Maryclare Bean M.D.   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  EXAM: IR ULTRASOUND GUIDANCE VASC ACCESS RIGHT; THROMBOECTOMY MECHANICAL VENOUS; IR INFUSION THROMBOL VENOUS INITIAL (MS); PORTAL VENOGRAPHY; ADDITIONAL ARTERIOGRAPHY  FLUOROSCOPY TIME:  8 min and 6 seconds.  MEDICATIONS AND MEDICAL HISTORY: Versed 4 mg, Fentanyl 175 mcg.  As antibiotic prophylaxis, Ancef was ordered pre-procedure and administered intravenously within one hour of incision.  ANESTHESIA/SEDATION: Moderate sedation time: 52 minutes  CONTRAST:  50 cc Omnipaque 300.  Carbon dioxide.  PROCEDURE: The procedure, risks, benefits, and alternatives were explained to the patient. Questions regarding the procedure were encouraged and answered. The patient understands and consents to the procedure.  The right flank was prepped with Betadine in a sterile fashion, and a sterile drape was applied covering the operative field. A sterile gown and sterile gloves were used for the procedure.  1% lidocaine was utilized for local infiltration. Under sonographic guidance, a 22 gauge Chiba needle was inserted into the peripheral aspect of the main right portal vein. Contrast was injected confirming needle position. The needle was removed over a 018 wire. The Accustick dilator was inserted. A small amount of contrast was injected. Carbon dioxide portal venography was performed.  The Accustick dilator was exchanged over the 3 J for a 5 [place] Kumpe the catheter. This was advanced into the lower portal vein and venography was performed. The catheter was advanced into the upper SMV and contrast was injected. There is partially occlusive thrombus in the main portal vein and occlusive thrombus in the SMV.  The catheter was carefully advanced over a glidewire to the small venous branches of the distal ileum in the right lower quadrant. Contrast was injected demonstrating stasis of flow within the small branches adjacent to the  intestines and complete occlusion within the larger more central branches.  The Kumpe the catheter was removed over a Rosen wire. A 6 French sheath was advanced over the wire to the upper main portal vein. The Angiojet was utilized to mechanically lysed the thrombus for 96 seconds.  Subsequently, a 90 cm 20 cm infusion length 5 French multi side-hole catheter was advanced over the [place] wire to the right lower quadrant. The catheter is positioned along side the thrombus extending from the main portal vein to the ileum. Lysis with 0.5  mg tPA was then instituted. Heparin protocol was resumed per peripheral access.  FINDINGS: Portal venography confirms partial occlusion of the main portal vein and complete occlusion of the SMV. Splenic vein is patent.  Subsequent venogram images confirmed occlusion of mesenteric [place] branches to the right lower quadrant. Peripheral branches adjacent to the intestine are patent with stasis of flow.  The neck series of images demonstrate placement of a 6 French sheath and multi side hole catheter along side the thrombus, extending from the main portal vein to the small mesenteric venous branches in the right lower quadrant.  COMPLICATIONS: None  IMPRESSION: Successful mechanical and pharmacological lysis of thrombus in the main portal vein, superior mesenteric vein, and venous branches in the right lower quadrant. TPA had 0.5 milligrams/hour will be instituted via multi side-hole catheter along side the thrombus.   Electronically Signed   By: Maryclare Bean M.D.   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  EXAM: IR ULTRASOUND GUIDANCE VASC ACCESS RIGHT; THROMBOECTOMY MECHANICAL VENOUS; IR INFUSION THROMBOL VENOUS INITIAL (MS); PORTAL VENOGRAPHY; ADDITIONAL ARTERIOGRAPHY  FLUOROSCOPY TIME:  8 min and 6 seconds.  MEDICATIONS AND MEDICAL HISTORY: Versed 4 mg, Fentanyl 175 mcg.  As  antibiotic prophylaxis, Ancef was ordered pre-procedure and administered intravenously within one hour of incision.  ANESTHESIA/SEDATION: Moderate sedation time: 52 minutes  CONTRAST:  50 cc Omnipaque 300.  Carbon dioxide.  PROCEDURE: The procedure, risks, benefits, and alternatives were explained to the patient. Questions regarding the procedure were encouraged and answered. The patient understands and consents to the procedure.  The right flank was prepped with Betadine in a sterile fashion, and a sterile drape was applied covering the operative field. A sterile gown and sterile gloves were used for the procedure.  1% lidocaine was utilized for local infiltration. Under sonographic guidance, a 22 gauge Chiba needle was inserted into the peripheral aspect of the main right portal vein. Contrast was injected confirming needle position. The needle was removed over a 018 wire. The Accustick dilator was inserted. A small amount of contrast was injected. Carbon dioxide portal venography was performed.  The Accustick dilator was exchanged over the 3 J for a 5 [place] Kumpe the catheter. This was advanced into the lower portal vein and venography was performed. The catheter was advanced into the upper SMV and contrast was injected. There is partially occlusive thrombus in the main portal vein and occlusive thrombus in the SMV.  The catheter was carefully advanced over a glidewire to the small venous branches of the distal ileum in the right lower quadrant. Contrast was injected demonstrating stasis of flow within the small branches adjacent to the intestines and complete occlusion within the larger more central branches.  The Kumpe the catheter was removed over a Rosen wire. A 6 French sheath was advanced over the wire to the upper main portal vein. The Angiojet was utilized to mechanically lysed the thrombus for 96 seconds.  Subsequently, a 90 cm 20 cm infusion length 5 French multi side-hole catheter was advanced over  the [place] wire to the right lower quadrant. The catheter is positioned along side the thrombus extending from the main portal vein to the ileum. Lysis with 0.5 mg tPA was then instituted. Heparin protocol was resumed per peripheral access.  FINDINGS: Portal venography confirms partial occlusion of the main portal vein and complete occlusion of the SMV. Splenic  vein is patent.  Subsequent venogram images confirmed occlusion of mesenteric Dylan branches to the right lower quadrant. Peripheral branches adjacent to the intestine are patent with stasis of flow.  The neck series of images demonstrate placement of a 6 French sheath and multi side hole catheter along side the thrombus, extending from the main portal vein to the small mesenteric venous branches in the right lower quadrant.  COMPLICATIONS: None  IMPRESSION: Successful mechanical and pharmacological lysis of thrombus in the main portal vein, superior mesenteric vein, and venous branches in the right lower quadrant. TPA had 0.5 milligrams/hour will be instituted via multi side-hole catheter along side the thrombus.   Electronically Signed   By: Maryclare Bean M.D.   On: 08/08/2014 17:21   Dg Chest Port 1 View  08/11/2014   CLINICAL DATA:  Hypoxia.  EXAM: PORTABLE CHEST - 1 VIEW  COMPARISON:  August 10, 2014.  FINDINGS: Stable cardiomediastinal silhouette. No pneumothorax or significant pleural effusion is noted. Nasogastric tube tip is seen in expected position of proximal stomach. Left lung is clear. Right lower lobe opacity noted on prior exam is slightly improved, although significant residual density remains concerning for atelectasis or pneumonia. Bony thorax appears intact.  IMPRESSION: Nasogastric tube tip seen in proximal stomach.  Improved right basilar opacity is noted, although significant residual density remains consistent with pneumonia or subsegmental atelectasis. Continued radiographic follow-up is recommended until resolution.   Electronically  Signed   By: Sabino Dick M.D.   On: 08/11/2014 08:30   Dg Chest Port 1 View  08/10/2014   CLINICAL DATA:  49 year old male with increasing shortness of breath  EXAM: PORTABLE CHEST - 1 VIEW  COMPARISON:  Prior chest x-ray 04/17/2013  FINDINGS: Low inspiratory volumes. Nonspecific right mid and lower lung airspace opacity. Suspect a small layering effusion. No pulmonary edema. Cardiac and mediastinal contours are within normal limits. No pneumothorax. No acute osseous abnormality.  IMPRESSION: Low inspiratory volumes with new right mid and lower lung opacities. Favored to reflect a small right layering pleural effusion with associated atelectasis. Right lower lobe infiltrate, or mild alveolar hemorrhage are also within the differential.   Electronically Signed   By: Jacqulynn Cadet M.D.   On: 08/10/2014 10:02   Dg Abd 2 Views  08/12/2014   CLINICAL DATA:  Adynamic ileus. Post catheter directed thrombolysis of the SMV and portal vein.  EXAM: ABDOMEN - 2 VIEW  COMPARISON:  08/11/2014 ; 08/10/2014; CT abdomen pelvis - 08/07/2014; abdominal MRI - 08/05/2014; catheter directed thrombolysis - 08/10/2014  FINDINGS: Enteric contrast is now seen extending throughout the colon to the level of the rectum. There is persistent marked gaseous distension of multiple upstream loops of small bowel with index loop within the left mid hemiabdomen measuring approximately 6.2 cm in maximal diameter. No definite pneumoperitoneum, pneumatosis or portal venous gas.  Enteric tube tip and side port projects over the gastric fundus.  Limited visualization lower thorax demonstrates a suspected small right-sided pleural effusion with associated right basilar heterogeneous/consolidative opacities.  Embolization coils overlie the peripheral / lateral aspect of the right lobe of the liver. No definite abnormal intra-abdominal calcifications.  Post bilateral hernia repair.  No acute osseus abnormalities.  IMPRESSION: 1. Findings suggestive of  persistent though potentially minimally improved ileus with interval transit of ingested contrast, now seen extending to the level of the rectum. Given marked distention of multiple loops of small bowel, continued attention on follow-up is recommended. 2. Suspected small right-sided effusion with associated right  basilar opacities, possibly atelectasis.   Electronically Signed   By: Sandi Mariscal M.D.   On: 08/12/2014 08:31   Dg Abd Acute W/chest  08/04/2014   CLINICAL DATA:  Abdominal pain  EXAM: ACUTE ABDOMEN SERIES (ABDOMEN 2 VIEW & CHEST 1 VIEW)  COMPARISON:  04/17/2013 chest x-ray  FINDINGS: The abdomen is essentially gasless, which limits the utility of radiography. Few bubbles noted in the right lower quadrant. There is no concerning intra-abdominal mass effect or calcification. No pneumoperitoneum. Previous lower abdominal wall mesh repair. Normal heart size and mediastinal contours. No acute infiltrate or edema. No effusion or pneumothorax. No acute osseous findings.  IMPRESSION: 1. There is no definitive obstruction, but this technique is limited by relatively gasless abdomen. Fluid dilated loops of bowel would be missed on this study. 2. Negative chest.   Electronically Signed   By: Jorje Guild M.D.   On: 08/04/2014 06:29   Dg Abd Portable 1v  08/27/2014   CLINICAL DATA:  Small bowel ischemia  EXAM: PORTABLE ABDOMEN - 1 VIEW  COMPARISON:  08/26/2014  FINDINGS: There has been progression of enteric contrast material from the small bowel into the colon and up to the rectum. A few air-filled loops of small bowel are identified within the left upper quadrant and right lower quadrant of the abdomen. No significant small bowel dilatation noted.  IMPRESSION: 1. Progression of enteric contrast material up to the level of the rectum.   Electronically Signed   By: Kerby Moors M.D.   On: 08/27/2014 07:40   Dg Abd Portable 1v  08/11/2014   CLINICAL DATA:  Small bowel obstruction.  EXAM: PORTABLE ABDOMEN -  1 VIEW  COMPARISON:  August 10, 2014.  FINDINGS: There remains multiple dilated loops of small bowel concerning for distal small bowel obstruction. These are stable compared to prior exam. No significant colonic dilatation is noted. No renal calculi are noted.  IMPRESSION: Stable dilated small bowel loops are noted consistent with distal small bowel obstruction. Continued radiographic follow-up is recommended.   Electronically Signed   By: Sabino Dick M.D.   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 There remains some contrast within the proximal colon stable from the prior study. Postsurgical changes are noted. An infusion catheter is seen within the portal vein and superior mesenteric vein. No free air is seen.  IMPRESSION: Increasing small bowel dilatation when compared with the prior exam.   Electronically Signed   By: Inez Catalina M.D.   On: 08/10/2014 10:01   Dg Abd Portable 1v  08/05/2014   CLINICAL DATA:  Subsequent evaluation for right-sided abdominal pain with nausea vomiting and diarrhea  EXAM: PORTABLE ABDOMEN - 1 VIEW  COMPARISON:  08/04/2014  FINDINGS: Mild gaseous distention of the stomach. Minimal gas elsewhere in the abdomen. There is a small amount of oral contrast appreciated throughout the colon.  IMPRESSION: Nonspecific gas pattern.   Electronically Signed   By: Skipper Cliche M.D.   On: 08/05/2014 11:21   Ir Embo Venous Not [place] [address]  08/11/2014   CLINICAL DATA:  Forty-eight hr portal vein and superior mesenteric vein lysis check.  EXAM: IR THROMB F/U EVAL ART/VEN FINAL  DAY; THROMBOECTOMY MECHANICAL VENOUS; PTA VENOUS; IR EMBO VENOUS NOT HEMORR HEMANG INC GUIDE ROADMAPPING   FLUOROSCOPY TIME:  Sixteen minutes  MEDICATIONS AND MEDICAL HISTORY: Versed two mg, Fentanyl 100 mcg.  Additional Medications: None.  ANESTHESIA/SEDATION: Moderate sedation time: 60 minutes  CONTRAST:  50 cc Omnipaque 300  PROCEDURE: The procedure, risks, benefits, and alternatives were explained to the patient. Questions regarding the procedure were encouraged and answered. The patient understands and consents to the procedure.  The right flank was prepped with Betadine in a sterile fashion, and a sterile drape was applied covering the operative field. A sterile gown and sterile gloves were used for the procedure.  Initially, an NG tube was placed. The tip was positioned in the body of the stomach. This resulted in significant decompression.  The inner wire of the infusion catheter was removed and contrast was injected for venography.  The catheter was retracted over a Rosen wire and the Angiojet was utilized through the persistent thrombus for 156 seconds.  Repeat venography through a Kumpe catheter was performed.  The Rosen wire was reinserted and a 5 mm balloon was utilized to dilate the thrombus within the superior mesenteric and portal veins.  Repeat venography demonstrates improvement.  A pigtail catheter was advanced over the wire to the main portal vein an utilizing twirl technique, additional mechanical thrombectomy was performed. Final venography was performed.  A copy catheter was advanced through the sheath and into the main portal vein. The catheter was retracted into the transhepatic tract. The tract was embolized with Gel-Foam slurry and several the 038 embolization coils. Two 4 mm coils and four 6 mm coils were utilized.  FINDINGS: Initial venography demonstrates improvement overall. The most significant finding was innumerable collateral venous structures now opacifying from the peripheral aspect of the superior mesenteric venous system. There is persistent chronic thrombus within the superior  mesenteric vein and some persistent thrombus in the portal vein.  After performing the Angiojet, angioplasty, and pigtail technique, there was again noted to be significant improvement after each subsequent intervention. There is some partial thrombus in the most inferior portal vein, but most of the portal venous thrombus had resolved. There is chronic thrombus along the superior mesenteric vein of but there is no a large channel through the thrombus.  COMPLICATIONS: None  IMPRESSION: Successful lysed cysts and thrombectomy of thrombus throughout the portal vein and superior mesenteric vein. At the conclusion of the treatment, there are now multiple collateral venous structures within the periphery of the superior mesenteric venous system that are opacifying an allowing for decompression. There is chronic thrombus within the superior mesenteric vein, but there is now a channel with re- cannulization. In addition, most of the thrombus in the portal vein has resolved. The patient will be placed on anti coagulation in 3 hr.   Electronically Signed   By: Maryclare Bean M.D.   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  EXAM: IR ULTRASOUND GUIDANCE VASC ACCESS RIGHT; THROMBOECTOMY MECHANICAL VENOUS; IR INFUSION THROMBOL VENOUS INITIAL (MS); PORTAL VENOGRAPHY; ADDITIONAL ARTERIOGRAPHY  FLUOROSCOPY TIME:  8 min and 6 seconds.  MEDICATIONS AND MEDICAL HISTORY: Versed 4 mg, Fentanyl 175 mcg.  As antibiotic prophylaxis, Ancef was ordered pre-procedure and administered intravenously within one hour of incision.  ANESTHESIA/SEDATION: Moderate sedation time: 52 minutes  CONTRAST:  50 cc Omnipaque 300.  Carbon dioxide.  PROCEDURE: The  procedure, risks, benefits, and alternatives were explained to the patient. Questions regarding the procedure were encouraged and answered. The patient understands and consents to the  procedure.  The right flank was prepped with Betadine in a sterile fashion, and a sterile drape was applied covering the operative field. A sterile gown and sterile gloves were used for the procedure.  1% lidocaine was utilized for local infiltration. Under sonographic guidance, a 22 gauge Chiba needle was inserted into the peripheral aspect of the main right portal vein. Contrast was injected confirming needle position. The needle was removed over a 018 wire. The Accustick dilator was inserted. A small amount of contrast was injected. Carbon dioxide portal venography was performed.  The Accustick dilator was exchanged over the 3 J for a 5 [place] Kumpe the catheter. This was advanced into the lower portal vein and venography was performed. The catheter was advanced into the upper SMV and contrast was injected. There is partially occlusive thrombus in the main portal vein and occlusive thrombus in the SMV.  The catheter was carefully advanced over a glidewire to the small venous branches of the distal ileum in the right lower quadrant. Contrast was injected demonstrating stasis of flow within the small branches adjacent to the intestines and complete occlusion within the larger more central branches.  The Kumpe the catheter was removed over a Rosen wire. A 6 French sheath was advanced over the wire to the upper main portal vein. The Angiojet was utilized to mechanically lysed the thrombus for 96 seconds.  Subsequently, a 90 cm 20 cm infusion length 5 French multi side-hole catheter was advanced over the [place] wire to the right lower quadrant. The catheter is positioned along side the thrombus extending from the main portal vein to the ileum. Lysis with 0.5 mg tPA was then instituted. Heparin protocol was resumed per peripheral access.  FINDINGS: Portal venography confirms partial occlusion of the main portal vein and complete occlusion of the SMV. Splenic vein is patent.  Subsequent venogram images confirmed  occlusion of mesenteric Dylan branches to the right lower quadrant. Peripheral branches adjacent to the intestine are patent with stasis of flow.  The neck series of images demonstrate placement of a 6 French sheath and multi side hole catheter along side the thrombus, extending from the main portal vein to the small mesenteric venous branches in the right lower quadrant.  COMPLICATIONS: None  IMPRESSION: Successful mechanical and pharmacological lysis of thrombus in the main portal vein, superior mesenteric vein, and venous branches in the right lower quadrant. TPA had 0.5 milligrams/hour will be instituted via multi side-hole catheter along side the thrombus.   Electronically Signed   By: Maryclare Bean M.D.   On: 08/08/2014 17:21   Ir Jacolyn Reedy F/u Eval Art/ven Alwyn Ren Day (ms)  08/09/2014   CLINICAL DATA:  49 year old with SMV and portal vein thrombus and concern for bowel ischemia. Catheter-directed thrombolytic therapy was started on 08/08/2014. Patient returns for follow-up angiography.  EXAM: THROMBOLYTIC FOLLOW-UP ANGIOGRAPHY  Physician: Stephan Minister. Henn, MD  FLUOROSCOPY TIME:  6 min and 18 seconds, 1090 mGy  MEDICATIONS: 2 mg Versed, 100 mcg fentanyl. A radiology nurse monitored the patient for moderate sedation.  ANESTHESIA/SEDATION: Moderate sedation time: 60 min  PROCEDURE: Patient was placed supine. The existing catheters in the right lateral abdomen were prepped and draped in sterile fashion. Maximal barrier sterile technique was utilized including caps, mask, sterile gowns, sterile gloves, sterile drape, hand hygiene and skin antiseptic. Contrast was initially injected through  the infusion wire port. Subsequently, the infusion wire was removed and contrast was injected through the infusion catheter. These initial angiographic images were limited. As a result, the infusion catheter was removed for a Kumpe catheter over a Bentson wire. Additional angiography was performed. A new 135 cm infusion catheter with 20  cm of infusion length was advanced over a Rosen wire. Tip was placed in the right lower quadrant and similar placed from the previous study. Catheters were secured to the patient. IV heparin and catheter-directed tPA was again started. TPA was started at 0.5 mg/hour.  FINDINGS: There is hepatopetal flow in the SMV but there continues to be a large amount of thrombus throughout the dilated SMV. There is flow within the main portal vein and the proximal left and right portal veins. Suspect nonocclusive clot in the main portal vein.  Multiple dilated loops of small bowel throughout the abdomen  COMPLICATIONS: None  IMPRESSION: There is flow in the superior mesenteric vein but there continues to be a large amount of irregular thrombus. As a result, a new infusion catheter was placed. Will continue catheter-directed thrombolytic therapy overnight with IV heparin. Plan to have the patient return to Interventional Radiology on 08/10/2014 for follow-up angiography and possibly mechanical thrombectomy.  Patient continues to have multiple dilated loops of small bowel throughout the abdomen. The patient's symptoms are improving with catheter-directed thrombolytic therapy but the radiographic appearance of the abdomen is still concerning for a bowel obstruction.   Electronically Signed   By: Markus Daft M.D.   On: 08/09/2014 18:16   Ir Jacolyn Reedy F/u Eval Art/ven Final Day (ms)  08/11/2014   CLINICAL DATA:  Forty-eight hr portal vein and superior mesenteric vein lysis check.  EXAM: IR THROMB F/U EVAL ART/VEN FINAL DAY; THROMBOECTOMY MECHANICAL VENOUS; PTA VENOUS; IR EMBO VENOUS NOT HEMORR HEMANG INC GUIDE ROADMAPPING  FLUOROSCOPY TIME:  Sixteen minutes  MEDICATIONS AND MEDICAL HISTORY: Versed two mg, Fentanyl 100 mcg.  Additional Medications: None.  ANESTHESIA/SEDATION: Moderate sedation time: 60 minutes  CONTRAST:  50 cc Omnipaque 300  PROCEDURE: The procedure, risks, benefits, and alternatives were explained to the patient.  Questions regarding the procedure were encouraged and answered. The patient understands and consents to the procedure.  The right flank was prepped with Betadine in a sterile fashion, and a sterile drape was applied covering the operative field. A sterile gown and sterile gloves were used for the procedure.  Initially, an NG tube was placed. The tip was positioned in the body of the stomach. This resulted in significant decompression.  The inner wire of the infusion catheter was removed and contrast was injected for venography.  The catheter was retracted over a Rosen wire and the Angiojet was utilized through the persistent thrombus for 156 seconds.  Repeat venography through a Kumpe catheter was performed.  The Rosen wire was reinserted and a 5 mm balloon was utilized to dilate the thrombus within the superior mesenteric and portal veins.  Repeat venography demonstrates improvement.  A pigtail catheter was advanced over the wire to the main portal vein an utilizing twirl technique, additional mechanical thrombectomy was performed. Final venography was performed.  A copy catheter was advanced through the sheath and into the main portal vein. The catheter was retracted into the transhepatic tract. The tract was embolized with Gel-Foam slurry and several the 038 embolization coils. Two 4 mm coils and four 6 mm coils were utilized.  FINDINGS: Initial venography demonstrates improvement overall. The most significant finding was innumerable  collateral venous structures now opacifying from the peripheral aspect of the superior mesenteric venous system. 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  After performing the Angiojet, angioplasty, and pigtail technique, there was again noted to be significant improvement after each subsequent intervention. There is some partial thrombus in the most inferior portal vein, but most of the portal venous  thrombus had resolved. There is chronic thrombus along the superior mesenteric vein of but there is no a large channel through the thrombus.  COMPLICATIONS: None  IMPRESSION: Successful lysed cysts and thrombectomy of thrombus throughout the portal vein and superior mesenteric vein. At the conclusion of the treatment, there are now multiple collateral venous structures within the periphery of the superior mesenteric venous system that are opacifying an allowing for decompression. There is chronic thrombus within the superior mesenteric vein, but there is now a channel with re- cannulization. In addition, most of the thrombus in the portal vein has resolved. The patient will be placed on anti coagulation in 3 hr.   Electronically Signed   By: Maryclare Bean M.D.   On: 08/11/2014 09:53    CBC  Recent Labs Lab 08/26/14 1248  08/28/14 0350 08/28/14 1242 08/28/14 2251 08/29/14 0540 08/30/14 0308  WBC 9.3  < > 5.7 5.5 6.9 6.6 8.4  HGB 11.7*  < > 10.0* 11.0* 12.0* 11.3* 10.2*  HCT 35.6*  < > 31.5* 34.8* 37.6* 34.9* 32.1*  PLT 277  < > 297 303 337 312 318  MCV 87.3  < > 89.5 89.0 88.5 90.2 89.2  MCH 28.7  < > 28.4 28.1 28.2 29.2 28.3  MCHC 32.9  < > 31.7 31.6 31.9 32.4 31.8  RDW 14.3  < > 14.2 14.1 14.1 14.0 14.1  LYMPHSABS 2.7  --   --   --   --   --   --   MONOABS 1.0  --   --   --   --   --   --   EOSABS 0.0  --   --   --   --   --   --   BASOSABS 0.0  --   --   --   --   --   --   < > = values in this interval not displayed.  Chemistries   Recent Labs Lab 08/25/14 1722 08/26/14 1248 08/27/14 0325 08/29/14 0940 08/30/14 0308  NA 133* 128* 135 138 137  K 3.7 4.0 3.8 4.7 4.7  CL 94* 89* 97 104 102  CO2 32 29 28 30 28   GLUCOSE 127* 136* 111* 114* 102*  BUN 22 27* 18 10 9   CREATININE 1.88* 1.75* 1.42* 1.30 1.14  CALCIUM 8.9 8.7 8.0* 8.0* 7.9*  AST 32 26 19  --   --   ALT 35 28 21  --   --   ALKPHOS 78 70 59  --   --   BILITOT 1.1 1.2 1.1  --   --     ------------------------------------------------------------------------------------------------------------------ estimated creatinine clearance is 108.5 mL/min (by C-G formula based on Cr of 1.14). ------------------------------------------------------------------------------------------------------------------ No results for input(s): HGBA1C in the last 72 hours. ------------------------------------------------------------------------------------------------------------------ No results for input(s): CHOL, HDL, LDLCALC, TRIG, CHOLHDL, LDLDIRECT in the last 72 hours. ------------------------------------------------------------------------------------------------------------------ No results for input(s): TSH, T4TOTAL, T3FREE, THYROIDAB in the last 72 hours.  Invalid input(s): FREET3 ------------------------------------------------------------------------------------------------------------------ No results for input(s): VITAMINB12, FOLATE, FERRITIN, TIBC, IRON, RETICCTPCT in the last 72 hours.  Coagulation profile  Recent Labs Lab 08/26/14 1835 08/27/14 0325  INR 1.33 1.31    No results for input(s): DDIMER in the last 72 hours.  Cardiac Enzymes  Recent Labs Lab 08/26/14 1353  TROPONINI <0.03   ------------------------------------------------------------------------------------------------------------------ Invalid input(s): POCBNP    Nichael Ehly D.O. on 08/30/2014 at 12:30 PM  Between 7am to 7pm - Pager - [phone]  After 7pm go to [email or web address] - password [email or web address]  And look for the night coverage person covering for me after hours  [facility] Office  [phone]

## 2014-08-30 NOTE — Progress Notes (Signed)
49 y.o. male with history of inferior NSTEMI status post PCI to the mid RCA followed by redo PCI more proximally in the RCA in 2013 with treatment of proximal in-stent restenosis with a new stent proximally. He has been lost to follow-up for cardiology and has not been taking Plavix for over a year. He was admitted on January 1st 2016 for acute onset abdominal pain and was found to have a thrombosed superior mesenteric vein with extension into the portal vein. This was thought to be related to testosterone replacement. Treated with thrombolytic lysis VIR. Following this the patient developed acute onset of substernal chest pressure on the evening of the 10th/morning left of the 11th. He was diagnosed with NSTEMI. His cardiac catheterization was notable for RCA in-stent thrombosis with up to 70% luminal narrowing. He underwent PTCA with minimal amount of residual thrombus. Now pt admitted to the hospital with RUQ abdominal pain secondary to small bowel perforation --> s/p partial bowel Resection on 08/28/2013.  POD #2  Subjective:  Feels better this AM.  Doesn't like NGT.  Pain controlled.  No PO, ++ Flatus, no BM No CP or Dyspnea.   Objective:  Vital Signs in the last 24 hours: Temp:  [97.9 F (36.6 C)-99 F (37.2 C)] 98.7 F (37.1 C) (01/27 0846) Pulse Rate:  [96-116] 96 (01/27 0846) Resp:  [14-19] 16 (01/27 1018) BP: (119-140)/(69-79) 140/75 mmHg (01/27 0846) SpO2:  [96 %-100 %] 99 % (01/27 1018)  Intake/Output from previous day: 01/26 0701 - 01/27 0700 In: 3187.9 [I.V.:2537.9; IV Piggyback:650] Out: 2285 [Urine:2015; Emesis/NG output:270] Intake/Output from this shift: Total I/O In: 522.5 [I.V.:522.5] Out: 200 [Urine:200]  Physical Exam: General appearance: alert, cooperative, appears stated age, no distress and mildly obese Neck: no adenopathy, no carotid bruit and no JVD Lungs: clear to auscultation bilaterally and normal percussion bilaterally Heart: regular rate and rhythm,  S1, S2 normal, no murmur, click, rub or gallop and normal apical impulse Abdomen: soft, appropriately tender post-op. present Bowel sounds Extremities: extremities normal, atraumatic, no cyanosis or edema Pulses: 2+ and symmetric Neurologic: Grossly normal  Lab Results:  Recent Labs  08/29/14 0540 08/30/14 0308  WBC 6.6 8.4  HGB 11.3* 10.2*  PLT 312 318    Recent Labs  08/29/14 0940 08/30/14 0308  NA 138 137  K 4.7 4.7  CL 104 102  CO2 30 28  GLUCOSE 114* 102*  BUN 10 9  CREATININE 1.30 1.14   No results for input(s): TROPONINI in the last 72 hours.  Invalid input(s): CK, MB Hepatic Function Panel No results for input(s): PROT, ALBUMIN, AST, ALT, ALKPHOS, BILITOT, BILIDIR, IBILI in the last 72 hours. No results for input(s): CHOL in the last 72 hours. No results for input(s): PROTIME in the last 72 hours.  Imaging: No new studies.  Cardiac Studies: Tele - SR/STach 90-110, PACs  Assessment/Plan:  Principal Problem:   Perforation bowel Active Problems:   HTN (hypertension)   Acute embolism and thrombosis of vein   Mesenteric vein thrombosis   CAD S/P percutaneous coronary angioplasty   Intestinal perforation  Per Surgery - plan is NPO with NGT in place x 1 more day.  Still NPO> but RN reports meds given.   CAD - s/p PTCA of ISR/thrombosis this month in setting of NSTEMI (& AMV-PV thrombosis) -- all thought to be related to recent Testosterone injections. --> No active angina Sx.  Agree with IV heparin until taking PO --> once taking PO, would restart ASA,Plavix &  Pradaxa if OK by Sgx.   ASA/Plavix x 1 month total given the extent of In-stent thrombosis noted @ cath; then OK to stop ASA  Pradaxa for SMV thrombosis  Essential HTN - currently only on IV Lopressor --> HRs are in 90s & BP in 120-130 mmHg range.  Increased to Lopressor to 5 mg IV q 8hr.  (was on Toprol 50 mg @ home in addition to Benicar HCTZ, once taking PO, would start BB of as Lopresor 25 mg  BID & convert back to Toprol)  Restart furosemide & fenofibrate on d/c.  Not much new to add --> will follow & make recommendations as needed.   LOS: 4 days    HARDING, DAVID W 08/30/2014, 12:05 PM

## 2014-08-31 ENCOUNTER — Ambulatory Visit: Payer: Self-pay

## 2014-08-31 ENCOUNTER — Telehealth: Payer: Self-pay | Admitting: Hematology and Oncology

## 2014-08-31 ENCOUNTER — Other Ambulatory Visit: Payer: Self-pay

## 2014-08-31 ENCOUNTER — Inpatient Hospital Stay: Payer: Self-pay | Admitting: Hematology and Oncology

## 2014-08-31 ENCOUNTER — Telehealth: Payer: Self-pay | Admitting: *Deleted

## 2014-08-31 DIAGNOSIS — I8290 Acute embolism and thrombosis of unspecified vein: Secondary | ICD-10-CM

## 2014-08-31 LAB — BASIC METABOLIC PANEL
Anion gap: 8 (ref 5–15)
BUN: 10 mg/dL (ref 6–23)
CO2: 27 mmol/L (ref 19–32)
Calcium: 7.9 mg/dL — ABNORMAL LOW (ref 8.4–10.5)
Chloride: 103 mmol/L (ref 96–112)
Creatinine, Ser: 1.03 mg/dL (ref 0.50–1.35)
GFR calc non Af Amer: 84 mL/min — ABNORMAL LOW (ref >90)
Glucose, Bld: 87 mg/dL (ref 70–99)
POTASSIUM: 3.8 mmol/L (ref 3.5–5.1)
SODIUM: 138 mmol/L (ref 135–145)

## 2014-08-31 LAB — CBC
HEMATOCRIT: 29.6 % — AB (ref 39.0–52.0)
Hemoglobin: 9.3 g/dL — ABNORMAL LOW (ref 13.0–17.0)
MCH: 28.5 pg (ref 26.0–34.0)
MCHC: 31.4 g/dL (ref 30.0–36.0)
MCV: 90.8 fL (ref 78.0–100.0)
Platelets: 367 10*3/uL (ref 150–400)
RBC: 3.26 MIL/uL — ABNORMAL LOW (ref 4.22–5.81)
RDW: 14.1 % (ref 11.5–15.5)
WBC: 6.3 10*3/uL (ref 4.0–10.5)

## 2014-08-31 LAB — RETICULOCYTES
RBC.: 3.48 MIL/uL — ABNORMAL LOW (ref 4.22–5.81)
RETIC COUNT ABSOLUTE: 55.7 10*3/uL (ref 19.0–186.0)
Retic Ct Pct: 1.6 % (ref 0.4–3.1)

## 2014-08-31 LAB — IRON AND TIBC
IRON: 21 ug/dL — AB (ref 42–165)
Saturation Ratios: 18 % — ABNORMAL LOW (ref 20–55)
TIBC: 114 ug/dL — AB (ref 215–435)
UIBC: 93 ug/dL — AB (ref 125–400)

## 2014-08-31 LAB — VITAMIN B12: VITAMIN B 12: 318 pg/mL (ref 211–911)

## 2014-08-31 LAB — FERRITIN: Ferritin: 812 ng/mL — ABNORMAL HIGH (ref 22–322)

## 2014-08-31 LAB — FOLATE: Folate: 5 ng/mL

## 2014-08-31 LAB — HEPARIN LEVEL (UNFRACTIONATED): Heparin Unfractionated: 0.33 IU/mL (ref 0.30–0.70)

## 2014-08-31 NOTE — Progress Notes (Signed)
[facility]                                                                              Patient Demographics  Dylan [name], is a 49 y.o. male, DOB - [birth year], ZOX:[id number]  Admit date - 08/26/2014   Admitting Physician Hosie Poisson, MD  Outpatient Primary MD for the patient is [name], [name], MD  LOS - 5   Chief Complaint  Patient presents with  . Abdominal Pain      HPI on 08/26/2014 by Dr. Hosie Poisson Dylan [name] is a 49 y.o. 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 He was seen by his PCP yesterday and labs were drawn and sent home and asked to come back if pain worsens. He denies any other complaints. In ED, oral contrast CT abdomen and pelvis revealed bowel perforation with mesenteric LAD. Surgery was consulted by EDP and we were requested to admit the patient. Cardiology consulted pre and peri operative medication management. Pt reports taking meds , including plavix yesterday morning .    Assessment & Plan  Perforated Bowel with periotonitis/Cdifficle -General surgery consulted and appreciated -S/p resection, appendectomy -Patient now having BM -NGTube removed, foley catheter removed -Patient started on clear liquid diet by surgery -Continue Zosyn -C. Difficile PCR positive, continue flagyl  CAD  -S/p PTCA Jan 2016 (NSTEMI, AMV-PV thrombosis, thought to be secondary to testosterone injections) -Cardiology consulted and appreciated -Patient currently chest pain free -Continue heparin -once able to restart orals, will place him on Aspirin, Plavix, pradaxa if ok with  surgery (aspirin and Plavix will need to be continued for one month, then Plavix alone thereafter) -Will speak to surgery regarding transitioning to orals  Hypertension/tachycardia -Continue IV Lopressor  Hyperlipidemia -Will need to resume an appropriate and Lovaza at discharge  Normocytic Anemia -Possible dilutional vs surgery -Baseline Hb 10-11.  -Will obtain anemia panel and continue to monitor CBC.  Code Status: Full  Family Communication: None at bedside  Disposition Plan: Admitted  Time Spent in minutes   30 minutes  Procedures  Appendectomy, Small bowel resection, Laparoscopy    Consults   General surgery, Dr. Ninfa Linden [facility]  DVT Prophylaxis  Heparin  Lab Results  Component Value Date   PLT 367 08/31/2014    Medications  Scheduled Meds: . antiseptic oral rinse  7 mL Mouth Rinse BID  . aspirin  81 mg Oral Daily  . clopidogrel  75 mg Oral Daily  . HYDROmorphone PCA 0.3 mg/mL   Intravenous 6 times per day  . metoprolol  5 mg Intravenous 3 times per day  . metronidazole  500 mg Intravenous 3 times per day  . piperacillin-tazobactam (ZOSYN)  IV  3.375 g Intravenous 3 times per day   Continuous Infusions: . sodium chloride 75 mL/hr (08/29/14 1400)  . heparin 2,950 Units/hr (08/31/14 0541)  PRN Meds:.acetaminophen, diphenhydrAMINE **OR** diphenhydrAMINE, HYDROmorphone (DILAUDID) injection, naloxone **AND** sodium chloride, ondansetron (ZOFRAN) IV, promethazine  Antibiotics    Anti-infectives    Start     Dose/Rate Route Frequency Ordered Stop   08/27/14 1200  vancomycin (VANCOCIN) IVPB 1000 mg/200 mL premix  Status:  Discontinued     1,000 mg200 mL/hr over 60 Minutes Intravenous Every 12 hours 08/26/14 2125 08/27/14 0759   08/27/14 0830  metroNIDAZOLE (FLAGYL) IVPB 500 mg     500 mg100 mL/hr over 60 Minutes Intravenous 3 times per day 08/27/14 0759     08/26/14 2300  vancomycin (VANCOCIN) 2,500 mg in sodium chloride 0.9 % 500 mL IVPB     2,500  mg250 mL/hr over 120 Minutes Intravenous  Once 08/26/14 2125 08/27/14 0050   08/26/14 2200  piperacillin-tazobactam (ZOSYN) IVPB 3.375 g     3.375 g12.5 mL/hr over 240 Minutes Intravenous 3 times per day 08/26/14 2125         Subjective:   Dylan [name] seen and examined today.  Patient states he is feeling better and had a bowel movement this morning.  Denies chest pain, Shortness of breath, nausea.  States abdomen is sore but tolerable.   Objective:   Filed Vitals:   08/31/14 0400 08/31/14 0432 08/31/14 0838 08/31/14 0902  BP:  125/66 139/82   Pulse:  91 93   Temp:  98.7 F (37.1 C) 98.2 F (36.8 C)   TempSrc:  Oral Oral   Resp: 15 20 12 19   Height:      Weight:      SpO2: 98% 99% 98% 99%    Wt Readings from Last 3 Encounters:  08/26/14 121.4 kg (267 lb 10.2 oz)  08/16/14 131.7 kg (290 lb 5.5 oz)  04/19/13 129.4 kg (285 lb 4.4 oz)     Intake/Output Summary (Last 24 hours) at 08/31/14 0923 Last data filed at 08/31/14 8119  Gross per 24 hour  Intake   2988 ml  Output   1565 ml  Net   1423 ml    Exam  General: Well developed, well nourished, NAD, appears stated age  74: NCAT,mucous membranes moist.   Cardiovascular: S1 S2 auscultated, no rubs, murmurs or gallops. Regular rate and rhythm.  Respiratory: Clear to auscultation bilaterally with equal chest rise  Abdomen: Soft, mildly tender, + bowel sounds, clean bandages  Extremities: warm dry without cyanosis clubbing or edema  Neuro: AAOx3, nonfocal  Psych: Appropriate affect and mood  Data Review   Micro Results Recent Results (from the past 240 hour(s))  Culture, blood (routine x 2)     Status: None (Preliminary result)   Collection Time: 08/25/14  5:47 PM  Result Value Ref Range Status   Specimen Description BLOOD ARM LEFT  Final   Special Requests BOTTLES DRAWN AEROBIC AND ANAEROBIC 10CC  Final   Culture   Final           BLOOD CULTURE RECEIVED NO GROWTH TO DATE CULTURE WILL BE HELD FOR 5  DAYS BEFORE ISSUING A FINAL NEGATIVE REPORT Performed at [facility]    Report Status PENDING  Incomplete  Culture, blood (routine x 2)     Status: None (Preliminary result)   Collection Time: 08/25/14  5:58 PM  Result Value Ref Range Status   Specimen Description BLOOD ARM RIGHT  Final   Special Requests   Final    BOTTLES DRAWN AEROBIC AND ANAEROBIC BLUE 10CC RED 5CC   Culture   Final  BLOOD CULTURE RECEIVED NO GROWTH TO DATE CULTURE WILL BE HELD FOR 5 DAYS BEFORE ISSUING A FINAL NEGATIVE REPORT Performed at [facility]    Report Status PENDING  Incomplete  Culture, blood (routine x 2)     Status: None (Preliminary result)   Collection Time: 08/26/14 10:00 PM  Result Value Ref Range Status   Specimen Description BLOOD LEFT HAND  Final   Special Requests BOTTLES DRAWN AEROBIC AND ANAEROBIC 10CC EA  Final   Culture   Final           BLOOD CULTURE RECEIVED NO GROWTH TO DATE CULTURE WILL BE HELD FOR 5 DAYS BEFORE ISSUING A FINAL NEGATIVE REPORT Performed at [facility]    Report Status PENDING  Incomplete  Culture, blood (routine x 2)     Status: None (Preliminary result)   Collection Time: 08/26/14 10:00 PM  Result Value Ref Range Status   Specimen Description BLOOD RIGHT ANTECUBITAL  Final   Special Requests BOTTLES DRAWN AEROBIC AND ANAEROBIC 10CC EA  Final   Culture   Final           BLOOD CULTURE RECEIVED NO GROWTH TO DATE CULTURE WILL BE HELD FOR 5 DAYS BEFORE ISSUING A FINAL NEGATIVE REPORT Performed at [facility]    Report Status PENDING  Incomplete  MRSA PCR Screening     Status: None   Collection Time: 08/26/14 10:20 PM  Result Value Ref Range Status   MRSA by PCR NEGATIVE NEGATIVE Final    Comment:        The GeneXpert MRSA Assay (FDA approved for NASAL specimens only), is one component of a comprehensive MRSA colonization surveillance program. It is not intended to diagnose MRSA infection nor to guide or monitor  treatment for MRSA infections.   Clostridium Difficile by PCR     Status: Abnormal   Collection Time: 08/27/14  7:59 AM  Result Value Ref Range Status   C difficile by pcr POSITIVE (A) NEGATIVE Final    Comment: CRITICAL RESULT CALLED TO, READ BACK BY AND VERIFIED WITH: B.RONCALLO RN [id number] 08/27/14 E.[email or web address]     Radiology Reports Ct Abdomen Pelvis Wo Contrast  08/26/2014   CLINICAL DATA:  Right upper quadrant pain. Nausea, vomiting and diarrhea. Pain for 2 days. Pain has been increasing.  EXAM: CT ABDOMEN AND PELVIS WITHOUT CONTRAST  TECHNIQUE: Multidetector CT imaging of the abdomen and pelvis was performed following the standard protocol without IV contrast.  COMPARISON:  CT, 08/07/2014  FINDINGS: There are significant inflammatory changes in the right lower quadrant surrounding the distal ileum and cecum. The appendix is not definitively seen. It may be hidden within free fluid in the right lower quadrant. Small bowel loops show no wall thickening and there are associated prominent mesenteric lymph nodes. A small amount of free fluid is associated with the inflammatory change in the right lower quadrant. Along 1 loop of the thick-walled, right lower quadrant small bowel there is extraluminal air. There is minimal extraluminal contrast along an area of apparent wall dehiscence.  Small right pleural effusion. There is dependent subsegmental atelectasis in the lower lobes, right greater than left. Heart is normal in size.  Dense material lies in the right liver lobe, new from the prior exam, reflecting the replaced endovascular coils. No other liver abnormality.  Spleen, gallbladder, pancreas, adrenal glands:  Normal.  2.9 cm low-density left renal mass with an associated calcification, unchanged. Is protrudes from the posterior midpole. No other renal  abnormality. Normal ureters. Bladder is unremarkable.  Remainder the bowel is unremarkable.  No free air. Hernia mesh lies along the anterior lower  abdominal wall.  Degenerative changes noted of the visualized spine. No osteoblastic or osteolytic lesions.  IMPRESSION: 1. There are significant inflammatory in the right lower quadrant centered on thick-walled loops of small bowel. One affected loop has a dehiscent wall where there is a small amount extraluminal contrast and air. A small amount of fluid is associated with the inflammatory right lower quadrant changes. Given recent endovascular procedures, the possibility of the abnormal small bowel loops being due to ischemia should be considered. The etiology could be infectious or inflammatory. 2. Appendix not visualized. Findings appear to be due to small bowel and not from a ruptured appendicitis.   Electronically Signed   By: Lajean Manes M.D.   On: 08/26/2014 17:20   Ct Abdomen Pelvis Wo Contrast  08/07/2014   CLINICAL DATA:  Generalized abdominal pain and cramping. Nausea and vomiting. Superior mesenteric vein thrombosis.  EXAM: CT ABDOMEN AND PELVIS WITHOUT CONTRAST  TECHNIQUE: Multidetector CT imaging of the abdomen and pelvis was performed following the standard protocol without IV contrast.  COMPARISON:  CT on 08/04/2014 and MRI on 08/05/2014  FINDINGS: Lower chest:  New small right pleural effusion.  Hepatobiliary:  No mass visualized on this non-contrast exam.  Pancreas: No mass or inflammatory process visualized on this non-contrast exam.  Spleen:  Within normal limits in size.  Adrenal Glands:  No mass identified.  Kidneys/Urinary tract: No evidence of urolithiasis or hydronephrosis. 3 cm complex cyst with mild mural calcifications in the posterior upper pole the left kidney is stable.  Stomach/Bowel/Peritoneum: Mild increase and mesenteric edema and mild ascites is seen since previous study. Persistent wall thickening is seen involving multiple small bowel loops in the right abdomen. In the setting of superior mesenteric vein thrombosis, this is suspicious for bowel ischemia. Increased  dilatation of proximal small bowel is also seen. No evidence of pneumatosis or free air.  Vascular/Lymphatic: No pathologically enlarged lymph nodes identified. Superior mesenteric vein remains distended, consistent with venous thrombosis as demonstrated on recent MRI.  Reproductive:  No mass or other significant abnormality noted.  Other:  None.  Musculoskeletal:  No suspicious bone lesions identified.  IMPRESSION: Persistent small bowel wall thickening in the right abdomen, suspicious for bowel ischemia in the setting of SMV thrombosis which was demonstrated on recent MRI.  Increased proximal small bowel dilatation, suspicious for small bowel obstruction.  Increase in diffuse mesenteric edema and ascites.  Critical Value/emergent results were called by telephone at the time of interpretation on 08/07/2014 at 2:37 pm to Dr. Louellen Molder , who verbally acknowledged these results.   Electronically Signed   By: Earle Gell M.D.   On: 08/07/2014 14:37   Ct Abdomen Pelvis Wo Contrast  08/04/2014   CLINICAL DATA:  Generalized abdomen pain, cramps, nausea, vomiting.  EXAM: CT ABDOMEN AND PELVIS WITHOUT CONTRAST  TECHNIQUE: Multidetector CT imaging of the abdomen and pelvis was performed following the standard protocol without IV contrast. There is oral contrast in the stomach only.  COMPARISON:  July 06, 2002  FINDINGS: The liver, spleen, pancreas, gallbladder, adrenal glands and right kidney are normal. There is no hydronephrosis bilaterally. There is a 2.3 x 3 cm slight low-density lesion in the posterior midpole left kidney with minimal rim calcification ; on the previous CT, a simple cysts was present in the same location. There is atherosclerosis of the abdominal  aorta without aneurysmal dilatation. There is no abdominal lymphadenopathy.  There are several loops of abnormal enlarged thick walled small bowel loops with surrounding inflammation and fluid in the right lower quadrant. There is no free air. The  appendix is not definitely seen. The colon is normal.  Fluid-filled bladder is normal. Small amount of free fluid is identified in the pelvis. The lung bases are clear. Degenerative joint changes of the spine are noted.  IMPRESSION: Several loops of abnormal enlarged thick walled small bowel loops with surrounding inflammation and fluid in the right lower quadrant. The findings are nonspecific but can be due to infectious or inflammatory etiology.   Electronically Signed   By: Abelardo Diesel M.D.   On: 08/04/2014 07:37   Dg Abd 1 View  08/25/2014   CLINICAL DATA:  Abdominal pain for 3 weeks.  EXAM: ABDOMEN - 1 VIEW  COMPARISON:  08/12/2014  FINDINGS: There are a few dilated loops of small bowel within the left upper quadrant of the abdomen which measure up to 3.9 cm. When compared with the previous exam the degree of bowel distention is significantly improved. Hernia mesh overlies the pelvis.  IMPRESSION: 1. A few persistent dilated loops of small bowel in the left upper quadrant.   Electronically Signed   By: Kerby Moors M.D.   On: 08/25/2014 18:25   Mr Abdomen Wo Contrast  08/05/2014   CLINICAL DATA:  Abdominal pain nausea and cramping. eval for mesenteric vein thrombosis. Pt is 300lbs and extremely claus.  EXAM: MRI/ MRA ABDOMEN WITHOUT CONTRAST  TECHNIQUE: Multiplanar, multiecho pulse sequences of the abdomen were obtained WITHOUT intravenous contrast. Angiographic images of abdomen were obtained using MRA technique WITHOUT intravenous contrast.Pre meds were given. Pt unable to stay awake for breath hold sequances. Was attemting to repeat axial ssfse lower in to the pelvis when pt became spastic and crawling out of the scanner. Unable to obtain any further imaging  : COMPARISON:  CT 08/04/2014 AND EARLIER STUDIES  FINDINGS: There is occlusive thrombus through the visualized superior mesenteric vein and main portal vein, terminating at the portal bifurcation. There may be small amount of nonocclusive  thrombus extending into the left portal vein. Normal flow signal in the splenic vein.  Limited assessment of the abdominal aorta and proximal visceral and renal branches grossly unremarkable. IVC is patent.  There is a small amount perihepatic ,perisplenic, right lower quadrant mesenteric, and pelvic ascites. No focal liver lesion identified. 2.6 cm fluid signal partially exophytic probable cyst from the interpolar region left kidney. Right kidney, adrenal glands, pancreas, and nondilated gallbladder unremarkable.  IMPRESSION:  1. Occlusive thrombus in superior mesenteric vein extending through the main portal vein. 2. Abdominal ascites 3. Left renal cyst.   Electronically Signed   By: Arne Cleveland M.D.   On: 08/05/2014 15:34   Mr Jodene Nam Abdomen Wo Contrast  08/05/2014   CLINICAL DATA:  Abdominal pain nausea and cramping. eval for mesenteric vein thrombosis. Pt is 300lbs and extremely claus.  EXAM: MRI/ MRA ABDOMEN WITHOUT CONTRAST  TECHNIQUE: Multiplanar, multiecho pulse sequences of the abdomen were obtained WITHOUT intravenous contrast. Angiographic images of abdomen were obtained using MRA technique WITHOUT intravenous contrast.Pre meds were given. Pt unable to stay awake for breath hold sequances. Was attemting to repeat axial ssfse lower in to the pelvis when pt became spastic and crawling out of the scanner. Unable to obtain any further imaging  : COMPARISON:  CT 08/04/2014 AND EARLIER STUDIES  FINDINGS: There is occlusive  thrombus through the visualized superior mesenteric vein and main portal vein, terminating at the portal bifurcation. There may be small amount of nonocclusive thrombus extending into the left portal vein. Normal flow signal in the splenic vein.  Limited assessment of the abdominal aorta and proximal visceral and renal branches grossly unremarkable. IVC is patent.  There is a small amount perihepatic ,perisplenic, right lower quadrant mesenteric, and pelvic ascites. No focal liver lesion  identified. 2.6 cm fluid signal partially exophytic probable cyst from the interpolar region left kidney. Right kidney, adrenal glands, pancreas, and nondilated gallbladder unremarkable.  IMPRESSION:  1. Occlusive thrombus in superior mesenteric vein extending through the main portal vein. 2. Abdominal ascites 3. Left renal cyst.   Electronically Signed   By: Arne Cleveland M.D.   On: 08/05/2014 15:34   Ir Angiogram Selective Each Additional Vessel  08/08/2014   CLINICAL DATA:  Superior mesenteric vein and portal vein DVT with bowel ischemia.  EXAM: IR ULTRASOUND GUIDANCE VASC ACCESS RIGHT; THROMBOECTOMY MECHANICAL VENOUS; IR INFUSION THROMBOL VENOUS INITIAL (MS); PORTAL VENOGRAPHY; ADDITIONAL ARTERIOGRAPHY  FLUOROSCOPY TIME:  8 min and 6 seconds.  MEDICATIONS AND MEDICAL HISTORY: Versed 4 mg, Fentanyl 175 mcg.  As antibiotic prophylaxis, Ancef was ordered pre-procedure and administered intravenously within one hour of incision.  ANESTHESIA/SEDATION: Moderate sedation time: 52 minutes  CONTRAST:  50 cc Omnipaque 300.  Carbon dioxide.  PROCEDURE: The procedure, risks, benefits, and alternatives were explained to the patient. Questions regarding the procedure were encouraged and answered. The patient understands and consents to the procedure.  The right flank was prepped with Betadine in a sterile fashion, and a sterile drape was applied covering the operative field. A sterile gown and sterile gloves were used for the procedure.  1% lidocaine was utilized for local infiltration. Under sonographic guidance, a 22 gauge Chiba needle was inserted into the peripheral aspect of the main right portal vein. Contrast was injected confirming needle position. The needle was removed over a 018 wire. The Accustick dilator was inserted. A small amount of contrast was injected. Carbon dioxide portal venography was performed.  The Accustick dilator was exchanged over the 3 J for a 5 [place] Kumpe the catheter. This was advanced  into the lower portal vein and venography was performed. The catheter was advanced into the upper SMV and contrast was injected. There is partially occlusive thrombus in the main portal vein and occlusive thrombus in the SMV.  The catheter was carefully advanced over a glidewire to the small venous branches of the distal ileum in the right lower quadrant. Contrast was injected demonstrating stasis of flow within the small branches adjacent to the intestines and complete occlusion within the larger more central branches.  The Kumpe the catheter was removed over a Rosen wire. A 6 French sheath was advanced over the wire to the upper main portal vein. The Angiojet was utilized to mechanically lysed the thrombus for 96 seconds.  Subsequently, a 90 cm 20 cm infusion length 5 French multi side-hole catheter was advanced over the [place] wire to the right lower quadrant. The catheter is positioned along side the thrombus extending from the main portal vein to the ileum. Lysis with 0.5 mg tPA was then instituted. Heparin protocol was resumed per peripheral access.  FINDINGS: Portal venography confirms partial occlusion of the main portal vein and complete occlusion of the SMV. Splenic vein is patent.  Subsequent venogram images confirmed occlusion of mesenteric brain branches to the right lower quadrant. Peripheral branches adjacent to  the intestine are patent with stasis of flow.  The neck series of images demonstrate placement of a 6 French sheath and multi side hole catheter along side the thrombus, extending from the main portal vein to the small mesenteric venous branches in the right lower quadrant.  COMPLICATIONS: None  IMPRESSION: Successful mechanical and pharmacological lysis of thrombus in the main portal vein, superior mesenteric vein, and venous branches in the right lower quadrant. TPA had 0.5 milligrams/hour will be instituted via multi side-hole catheter along side the thrombus.   Electronically Signed   By:  Maryclare Bean M.D.   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  EXAM: IR ULTRASOUND GUIDANCE VASC ACCESS RIGHT; THROMBOECTOMY MECHANICAL VENOUS; IR INFUSION THROMBOL VENOUS INITIAL (MS); PORTAL VENOGRAPHY; ADDITIONAL ARTERIOGRAPHY  FLUOROSCOPY TIME:  8 min and 6 seconds.  MEDICATIONS AND MEDICAL HISTORY: Versed 4 mg, Fentanyl 175 mcg.  As antibiotic prophylaxis, Ancef was ordered pre-procedure and administered intravenously within one hour of incision.  ANESTHESIA/SEDATION: Moderate sedation time: 52 minutes  CONTRAST:  50 cc Omnipaque 300.  Carbon dioxide.  PROCEDURE: The procedure, risks, benefits, and alternatives were explained to the patient. Questions regarding the procedure were encouraged and answered. The patient understands and consents to the procedure.  The right flank was prepped with Betadine in a sterile fashion, and a sterile drape was applied covering the operative field. A sterile gown and sterile gloves were used for the procedure.  1% lidocaine was utilized for local infiltration. Under sonographic guidance, a 22 gauge Chiba needle was inserted into the peripheral aspect of the main right portal vein. Contrast was injected confirming needle position. The needle was removed over a 018 wire. The Accustick dilator was inserted. A small amount of contrast was injected. Carbon dioxide portal venography was performed.  The Accustick dilator was exchanged over the 3 J for a 5 [place] Kumpe the catheter. This was advanced into the lower portal vein and venography was performed. The catheter was advanced into the upper SMV and contrast was injected. There is partially occlusive thrombus in the main portal vein and occlusive thrombus in the SMV.  The catheter was carefully advanced over a glidewire to the small venous branches of the distal ileum in the right lower quadrant. Contrast was injected  demonstrating stasis of flow within the small branches adjacent to the intestines and complete occlusion within the larger more central branches.  The Kumpe the catheter was removed over a Rosen wire. A 6 French sheath was advanced over the wire to the upper main portal vein. The Angiojet was utilized to mechanically lysed the thrombus for 96 seconds.  Subsequently, a 90 cm 20 cm infusion length 5 French multi side-hole catheter was advanced over the [place] wire to the right lower quadrant. The catheter is positioned along side the thrombus extending from the main portal vein to the ileum. Lysis with 0.5 mg tPA was then instituted. Heparin protocol was resumed per peripheral access.  FINDINGS: Portal venography confirms partial occlusion of the main portal vein and complete occlusion of the SMV. Splenic vein is patent.  Subsequent venogram images confirmed occlusion of mesenteric brain branches to the right lower quadrant. Peripheral branches adjacent to the intestine are patent with stasis of flow.  The neck series of images demonstrate placement of a 6 French sheath and multi side hole catheter along side the thrombus, extending from  the main portal vein to the small mesenteric venous branches in the right lower quadrant.  COMPLICATIONS: None  IMPRESSION: Successful mechanical and pharmacological lysis of thrombus in the main portal vein, superior mesenteric vein, and venous branches in the right lower quadrant. TPA had 0.5 milligrams/hour will be instituted via multi side-hole catheter along side the thrombus.   Electronically Signed   By: Maryclare Bean M.D.   On: 08/08/2014 17:21   US Renal Port  08/05/2014   CLINICAL DATA:  Acute renal injury.  Hypertension.  EXAM: RENAL/URINARY TRACT ULTRASOUND COMPLETE  COMPARISON:  08/04/2014  FINDINGS: Right Kidney:  Length: 13.0 cm. Echogenicity within normal limits. No mass or hydronephrosis visualized.  Left Kidney:  Length: 12.3 cm. Echogenicity within normal limits. No  hydronephrosis. 2.2 by 1.7 cm complex exophytic lesion of the left mid kidney, as shown on CT scan.  Bladder:  Appears normal for degree of bladder distention.  Other: Small amount of ascites adjacent to the right hepatic lobe. 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  IMPRESSION: 1. 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 This could be confirmed with noncontrast 3D time-of-flight MRI of the mesenteric vasculature, if clinically warranted. 2. Small amount of ascites adjacent to the right hepatic lobe. 3. The 2.2 by 1.7 cm exophytic lesion of the left mid kidney is confirmed to be complex by ultrasound. I do note that there was a reasonably benign appearing cyst in this location on 07/06/2002. Although likely benign, the lesion is not completely specific on today's exam. These results were called by telephone at the time of interpretation on 08/05/2014 at 12:42 pm to Dr. Louellen Molder , who verbally acknowledged these results.   Electronically Signed   By: Sherryl Barters M.D.   On: 08/05/2014 12:46   Ir Pta Venous Right  08/11/2014   CLINICAL DATA:  Forty-eight hr portal vein and superior mesenteric vein lysis check.  EXAM: IR THROMB F/U EVAL ART/VEN FINAL DAY; THROMBOECTOMY MECHANICAL VENOUS; PTA VENOUS; IR EMBO VENOUS NOT HEMORR HEMANG INC GUIDE ROADMAPPING  FLUOROSCOPY TIME:  Sixteen minutes  MEDICATIONS AND MEDICAL HISTORY: Versed two mg, Fentanyl 100 mcg.  Additional Medications: None.  ANESTHESIA/SEDATION: Moderate sedation time: 60 minutes  CONTRAST:  50 cc Omnipaque 300  PROCEDURE: The procedure, risks, benefits, and alternatives were explained to the patient. Questions regarding the procedure were encouraged and answered. The patient understands and consents to the procedure.  The right  flank was prepped with Betadine in a sterile fashion, and a sterile drape was applied covering the operative field. A sterile gown and sterile gloves were used for the procedure.  Initially, an NG tube was placed. The tip was positioned in the body of the stomach. This resulted in significant decompression.  The inner wire of the infusion catheter was removed and contrast was injected for venography.  The catheter was retracted over a Rosen wire and the Angiojet was utilized through the persistent thrombus for 156 seconds.  Repeat venography through a Kumpe catheter was performed.  The Rosen wire was reinserted and a 5 mm balloon was utilized to dilate the thrombus within the superior mesenteric and portal veins.  Repeat venography demonstrates improvement.  A pigtail catheter was advanced over the wire to the main portal vein an utilizing twirl technique, additional mechanical thrombectomy  was performed. Final venography was performed.  A copy catheter was advanced through the sheath and into the main portal vein. The catheter was retracted into the transhepatic tract. The tract was embolized with Gel-Foam slurry and several the 038 embolization coils. Two 4 mm coils and four 6 mm coils were utilized.  FINDINGS: Initial venography demonstrates improvement overall. The most significant finding was innumerable collateral venous structures now opacifying from the peripheral aspect of the superior mesenteric venous system. 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  After performing the Angiojet, angioplasty, and pigtail technique, there was again noted to be significant improvement after each subsequent intervention. There is some partial thrombus in the most inferior portal vein, but most of the portal venous thrombus had resolved. There is chronic thrombus along the superior mesenteric vein of but there is no a large channel through the  thrombus.  COMPLICATIONS: None  IMPRESSION: Successful lysed cysts and thrombectomy of thrombus throughout the portal vein and superior mesenteric vein. At the conclusion of the treatment, there are now multiple collateral venous structures within the periphery of the superior mesenteric venous system that are opacifying an allowing for decompression. There is chronic thrombus within the superior mesenteric vein, but there is now a channel with re- cannulization. In addition, most of the thrombus in the portal vein has resolved. The patient will be placed on anti coagulation in 3 hr.   Electronically Signed   By: Maryclare Bean M.D.   On: 08/11/2014 09:53   Ir Thrombect Veno Mech Mod Sed  08/11/2014   CLINICAL DATA:  Forty-eight hr portal vein and superior mesenteric vein lysis check.  EXAM: IR THROMB F/U EVAL ART/VEN FINAL DAY; THROMBOECTOMY MECHANICAL VENOUS; PTA VENOUS; IR EMBO VENOUS NOT HEMORR HEMANG INC GUIDE ROADMAPPING  FLUOROSCOPY TIME:  Sixteen minutes  MEDICATIONS AND MEDICAL HISTORY: Versed two mg, Fentanyl 100 mcg.  Additional Medications: None.  ANESTHESIA/SEDATION: Moderate sedation time: 60 minutes  CONTRAST:  50 cc Omnipaque 300  PROCEDURE: The procedure, risks, benefits, and alternatives were explained to the patient. Questions regarding the procedure were encouraged and answered. The patient understands and consents to the procedure.  The right flank was prepped with Betadine in a sterile fashion, and a sterile drape was applied covering the operative field. A sterile gown and sterile gloves were used for the procedure.  Initially, an NG tube was placed. The tip was positioned in the body of the stomach. This resulted in significant decompression.  The inner wire of the infusion catheter was removed and contrast was injected for venography.  The catheter was retracted over a Rosen wire and the Angiojet was utilized through the persistent thrombus for 156 seconds.  Repeat venography through a Kumpe  catheter was performed.  The Rosen wire was reinserted and a 5 mm balloon was utilized to dilate the thrombus within the superior mesenteric and portal veins.  Repeat venography demonstrates improvement.  A pigtail catheter was advanced over the wire to the main portal vein an utilizing twirl technique, additional mechanical thrombectomy was performed. Final venography was performed.  A copy catheter was advanced through the sheath and into the main portal vein. The catheter was retracted into the transhepatic tract. The tract was embolized with Gel-Foam slurry and several the 038 embolization coils. Two 4 mm coils and four 6 mm coils were utilized.  FINDINGS: Initial venography demonstrates improvement overall. The most significant finding was innumerable collateral venous structures  now opacifying from the peripheral aspect of the superior mesenteric venous system. 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  After performing the Angiojet, angioplasty, and pigtail technique, there was again noted to be significant improvement after each subsequent intervention. There is some partial thrombus in the most inferior portal vein, but most of the portal venous thrombus had resolved. There is chronic thrombus along the superior mesenteric vein of but there is no a large channel through the thrombus.  COMPLICATIONS: None  IMPRESSION: Successful lysed cysts and thrombectomy of thrombus throughout the portal vein and superior mesenteric vein. At the conclusion of the treatment, there are now multiple collateral venous structures within the periphery of the superior mesenteric venous system that are opacifying an allowing for decompression. There is chronic thrombus within the superior mesenteric vein, but there is now a channel with re- cannulization. In addition, most of the thrombus in the portal vein has resolved. The patient will be placed on  anti coagulation in 3 hr.   Electronically Signed   By: Maryclare Bean M.D.   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  EXAM: IR ULTRASOUND GUIDANCE VASC ACCESS RIGHT; THROMBOECTOMY MECHANICAL VENOUS; IR INFUSION THROMBOL VENOUS INITIAL (MS); PORTAL VENOGRAPHY; ADDITIONAL ARTERIOGRAPHY  FLUOROSCOPY TIME:  8 min and 6 seconds.  MEDICATIONS AND MEDICAL HISTORY: Versed 4 mg, Fentanyl 175 mcg.  As antibiotic prophylaxis, Ancef was ordered pre-procedure and administered intravenously within one hour of incision.  ANESTHESIA/SEDATION: Moderate sedation time: 52 minutes  CONTRAST:  50 cc Omnipaque 300.  Carbon dioxide.  PROCEDURE: The procedure, risks, benefits, and alternatives were explained to the patient. Questions regarding the procedure were encouraged and answered. The patient understands and consents to the procedure.  The right flank was prepped with Betadine in a sterile fashion, and a sterile drape was applied covering the operative field. A sterile gown and sterile gloves were used for the procedure.  1% lidocaine was utilized for local infiltration. Under sonographic guidance, a 22 gauge Chiba needle was inserted into the peripheral aspect of the main right portal vein. Contrast was injected confirming needle position. The needle was removed over a 018 wire. The Accustick dilator was inserted. A small amount of contrast was injected. Carbon dioxide portal venography was performed.  The Accustick dilator was exchanged over the 3 J for a 5 [place] Kumpe the catheter. This was advanced into the lower portal vein and venography was performed. The catheter was advanced into the upper SMV and contrast was injected. There is partially occlusive thrombus in the main portal vein and occlusive thrombus in the SMV.  The catheter was carefully advanced over a glidewire to the small venous branches of the distal ileum  in the right lower quadrant. Contrast was injected demonstrating stasis of flow within the small branches adjacent to the intestines and complete occlusion within the larger more central branches.  The Kumpe the catheter was removed over a Rosen wire. A 6 French sheath was advanced over the wire to the upper main portal vein. The Angiojet was utilized to mechanically lysed the thrombus for 96 seconds.  Subsequently, a 90 cm 20 cm infusion length 5 French multi side-hole catheter was advanced over the [place] wire to the right lower quadrant. The catheter is positioned along side the thrombus extending from the main portal  vein to the ileum. Lysis with 0.5 mg tPA was then instituted. Heparin protocol was resumed per peripheral access.  FINDINGS: Portal venography confirms partial occlusion of the main portal vein and complete occlusion of the SMV. Splenic vein is patent.  Subsequent venogram images confirmed occlusion of mesenteric brain branches to the right lower quadrant. Peripheral branches adjacent to the intestine are patent with stasis of flow.  The neck series of images demonstrate placement of a 6 French sheath and multi side hole catheter along side the thrombus, extending from the main portal vein to the small mesenteric venous branches in the right lower quadrant.  COMPLICATIONS: None  IMPRESSION: Successful mechanical and pharmacological lysis of thrombus in the main portal vein, superior mesenteric vein, and venous branches in the right lower quadrant. TPA had 0.5 milligrams/hour will be instituted via multi side-hole catheter along side the thrombus.   Electronically Signed   By: Maryclare Bean M.D.   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  EXAM: IR ULTRASOUND GUIDANCE VASC ACCESS RIGHT; THROMBOECTOMY MECHANICAL VENOUS; IR INFUSION THROMBOL VENOUS INITIAL (MS); PORTAL VENOGRAPHY; ADDITIONAL  ARTERIOGRAPHY  FLUOROSCOPY TIME:  8 min and 6 seconds.  MEDICATIONS AND MEDICAL HISTORY: Versed 4 mg, Fentanyl 175 mcg.  As antibiotic prophylaxis, Ancef was ordered pre-procedure and administered intravenously within one hour of incision.  ANESTHESIA/SEDATION: Moderate sedation time: 52 minutes  CONTRAST:  50 cc Omnipaque 300.  Carbon dioxide.  PROCEDURE: The procedure, risks, benefits, and alternatives were explained to the patient. Questions regarding the procedure were encouraged and answered. The patient understands and consents to the procedure.  The right flank was prepped with Betadine in a sterile fashion, and a sterile drape was applied covering the operative field. A sterile gown and sterile gloves were used for the procedure.  1% lidocaine was utilized for local infiltration. Under sonographic guidance, a 22 gauge Chiba needle was inserted into the peripheral aspect of the main right portal vein. Contrast was injected confirming needle position. The needle was removed over a 018 wire. The Accustick dilator was inserted. A small amount of contrast was injected. Carbon dioxide portal venography was performed.  The Accustick dilator was exchanged over the 3 J for a 5 [place] Kumpe the catheter. This was advanced into the lower portal vein and venography was performed. The catheter was advanced into the upper SMV and contrast was injected. There is partially occlusive thrombus in the main portal vein and occlusive thrombus in the SMV.  The catheter was carefully advanced over a glidewire to the small venous branches of the distal ileum in the right lower quadrant. Contrast was injected demonstrating stasis of flow within the small branches adjacent to the intestines and complete occlusion within the larger more central branches.  The Kumpe the catheter was removed over a Rosen wire. A 6 French sheath was advanced over the wire to the upper main portal vein. The Angiojet was utilized to mechanically lysed the  thrombus for 96 seconds.  Subsequently, a 90 cm 20 cm infusion length 5 French multi side-hole catheter was advanced over the [place] wire to the right lower quadrant. The catheter is positioned along side the thrombus extending from the main portal vein to the ileum. Lysis with 0.5 mg tPA was then instituted. Heparin protocol was resumed per peripheral access.  FINDINGS: Portal venography confirms partial occlusion of the main portal vein  and complete occlusion of the SMV. Splenic vein is patent.  Subsequent venogram images confirmed occlusion of mesenteric brain branches to the right lower quadrant. Peripheral branches adjacent to the intestine are patent with stasis of flow.  The neck series of images demonstrate placement of a 6 French sheath and multi side hole catheter along side the thrombus, extending from the main portal vein to the small mesenteric venous branches in the right lower quadrant.  COMPLICATIONS: None  IMPRESSION: Successful mechanical and pharmacological lysis of thrombus in the main portal vein, superior mesenteric vein, and venous branches in the right lower quadrant. TPA had 0.5 milligrams/hour will be instituted via multi side-hole catheter along side the thrombus.   Electronically Signed   By: Maryclare Bean M.D.   On: 08/08/2014 17:21   Dg Chest Port 1 View  08/11/2014   CLINICAL DATA:  Hypoxia.  EXAM: PORTABLE CHEST - 1 VIEW  COMPARISON:  August 10, 2014.  FINDINGS: Stable cardiomediastinal silhouette. No pneumothorax or significant pleural effusion is noted. Nasogastric tube tip is seen in expected position of proximal stomach. Left lung is clear. Right lower lobe opacity noted on prior exam is slightly improved, although significant residual density remains concerning for atelectasis or pneumonia. Bony thorax appears intact.  IMPRESSION: Nasogastric tube tip seen in proximal stomach.  Improved right basilar opacity is noted, although significant residual density remains consistent with  pneumonia or subsegmental atelectasis. Continued radiographic follow-up is recommended until resolution.   Electronically Signed   By: Sabino Dick M.D.   On: 08/11/2014 08:30   Dg Chest Port 1 View  08/10/2014   CLINICAL DATA:  49 year old male with increasing shortness of breath  EXAM: PORTABLE CHEST - 1 VIEW  COMPARISON:  Prior chest x-ray 04/17/2013  FINDINGS: Low inspiratory volumes. Nonspecific right mid and lower lung airspace opacity. Suspect a small layering effusion. No pulmonary edema. Cardiac and mediastinal contours are within normal limits. No pneumothorax. No acute osseous abnormality.  IMPRESSION: Low inspiratory volumes with new right mid and lower lung opacities. Favored to reflect a small right layering pleural effusion with associated atelectasis. Right lower lobe infiltrate, or mild alveolar hemorrhage are also within the differential.   Electronically Signed   By: Jacqulynn Cadet M.D.   On: 08/10/2014 10:02   Dg Abd 2 Views  08/12/2014   CLINICAL DATA:  Adynamic ileus. Post catheter directed thrombolysis of the SMV and portal vein.  EXAM: ABDOMEN - 2 VIEW  COMPARISON:  08/11/2014 ; 08/10/2014; CT abdomen pelvis - 08/07/2014; abdominal MRI - 08/05/2014; catheter directed thrombolysis - 08/10/2014  FINDINGS: Enteric contrast is now seen extending throughout the colon to the level of the rectum. There is persistent marked gaseous distension of multiple upstream loops of small bowel with index loop within the left mid hemiabdomen measuring approximately 6.2 cm in maximal diameter. No definite pneumoperitoneum, pneumatosis or portal venous gas.  Enteric tube tip and side port projects over the gastric fundus.  Limited visualization lower thorax demonstrates a suspected small right-sided pleural effusion with associated right basilar heterogeneous/consolidative opacities.  Embolization coils overlie the peripheral / lateral aspect of the right lobe of the liver. No definite abnormal  intra-abdominal calcifications.  Post bilateral hernia repair.  No acute osseus abnormalities.  IMPRESSION: 1. Findings suggestive of persistent though potentially minimally improved ileus with interval transit of ingested contrast, now seen extending to the level of the rectum. Given marked distention of multiple loops of small bowel, continued attention on follow-up is recommended. 2.  Suspected small right-sided effusion with associated right basilar opacities, possibly atelectasis.   Electronically Signed   By: Sandi Mariscal M.D.   On: 08/12/2014 08:31   Dg Abd Acute W/chest  08/04/2014   CLINICAL DATA:  Abdominal pain  EXAM: ACUTE ABDOMEN SERIES (ABDOMEN 2 VIEW & CHEST 1 VIEW)  COMPARISON:  04/17/2013 chest x-ray  FINDINGS: The abdomen is essentially gasless, which limits the utility of radiography. Few bubbles noted in the right lower quadrant. There is no concerning intra-abdominal mass effect or calcification. No pneumoperitoneum. Previous lower abdominal wall mesh repair. Normal heart size and mediastinal contours. No acute infiltrate or edema. No effusion or pneumothorax. No acute osseous findings.  IMPRESSION: 1. There is no definitive obstruction, but this technique is limited by relatively gasless abdomen. Fluid dilated loops of bowel would be missed on this study. 2. Negative chest.   Electronically Signed   By: Jorje Guild M.D.   On: 08/04/2014 06:29   Dg Abd Portable 1v  08/27/2014   CLINICAL DATA:  Small bowel ischemia  EXAM: PORTABLE ABDOMEN - 1 VIEW  COMPARISON:  08/26/2014  FINDINGS: There has been progression of enteric contrast material from the small bowel into the colon and up to the rectum. A few air-filled loops of small bowel are identified within the left upper quadrant and right lower quadrant of the abdomen. No significant small bowel dilatation noted.  IMPRESSION: 1. Progression of enteric contrast material up to the level of the rectum.   Electronically Signed   By: Kerby Moors M.D.   On: 08/27/2014 07:40   Dg Abd Portable 1v  08/11/2014   CLINICAL DATA:  Small bowel obstruction.  EXAM: PORTABLE ABDOMEN - 1 VIEW  COMPARISON:  August 10, 2014.  FINDINGS: There remains multiple dilated loops of small bowel concerning for distal small bowel obstruction. These are stable compared to prior exam. No significant colonic dilatation is noted. No renal calculi are noted.  IMPRESSION: Stable dilated small bowel loops are noted consistent with distal small bowel obstruction. Continued radiographic follow-up is recommended.   Electronically Signed   By: Sabino Dick M.D.   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 There remains some contrast within the proximal colon stable from the prior study. Postsurgical changes are noted. An infusion catheter is seen within the portal vein and superior mesenteric vein. No free air is seen.  IMPRESSION: Increasing small bowel dilatation when compared with the prior exam.   Electronically Signed   By: Inez Catalina M.D.   On: 08/10/2014 10:01   Dg Abd Portable 1v  08/05/2014   CLINICAL DATA:  Subsequent evaluation for right-sided abdominal pain with nausea vomiting and diarrhea  EXAM: PORTABLE ABDOMEN - 1 VIEW  COMPARISON:  08/04/2014  FINDINGS: Mild gaseous distention of the stomach. Minimal gas elsewhere in the abdomen. There is a small amount of oral contrast appreciated throughout the colon.  IMPRESSION: Nonspecific gas pattern.   Electronically Signed   By: Skipper Cliche M.D.   On: 08/05/2014 11:21   Ir Embo Venous Not [place] [address]  08/11/2014   CLINICAL DATA:  Forty-eight hr portal vein and superior mesenteric vein lysis check.  EXAM: IR  THROMB F/U EVAL ART/VEN FINAL DAY; THROMBOECTOMY MECHANICAL VENOUS; PTA VENOUS; IR EMBO VENOUS NOT HEMORR HEMANG INC GUIDE ROADMAPPING  FLUOROSCOPY TIME:  Sixteen minutes  MEDICATIONS AND MEDICAL HISTORY: Versed two mg, Fentanyl 100 mcg.  Additional Medications: None.  ANESTHESIA/SEDATION: Moderate sedation time: 60 minutes  CONTRAST:  50 cc Omnipaque 300  PROCEDURE: The procedure, risks, benefits, and alternatives were explained to the patient. Questions regarding the procedure were encouraged and answered. The patient understands and consents to the procedure.  The right flank was prepped with Betadine in a sterile fashion, and a sterile drape was applied covering the operative field. A sterile gown and sterile gloves were used for the procedure.  Initially, an NG tube was placed. The tip was positioned in the body of the stomach. This resulted in significant decompression.  The inner wire of the infusion catheter was removed and contrast was injected for venography.  The catheter was retracted over a Rosen wire and the Angiojet was utilized through the persistent thrombus for 156 seconds.  Repeat venography through a Kumpe catheter was performed.  The Rosen wire was reinserted and a 5 mm balloon was utilized to dilate the thrombus within the superior mesenteric and portal veins.  Repeat venography demonstrates improvement.  A pigtail catheter was advanced over the wire to the main portal vein an utilizing twirl technique, additional mechanical thrombectomy was performed. Final venography was performed.  A copy catheter was advanced through the sheath and into the main portal vein. The catheter was retracted into the transhepatic tract. The tract was embolized with Gel-Foam slurry and several the 038 embolization coils. Two 4 mm coils and four 6 mm coils were utilized.  FINDINGS: Initial venography demonstrates improvement overall. The most significant finding was innumerable collateral venous structures now  opacifying from the peripheral aspect of the superior mesenteric venous system. 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  After performing the Angiojet, angioplasty, and pigtail technique, there was again noted to be significant improvement after each subsequent intervention. There is some partial thrombus in the most inferior portal vein, but most of the portal venous thrombus had resolved. There is chronic thrombus along the superior mesenteric vein of but there is no a large channel through the thrombus.  COMPLICATIONS: None  IMPRESSION: Successful lysed cysts and thrombectomy of thrombus throughout the portal vein and superior mesenteric vein. At the conclusion of the treatment, there are now multiple collateral venous structures within the periphery of the superior mesenteric venous system that are opacifying an allowing for decompression. There is chronic thrombus within the superior mesenteric vein, but there is now a channel with re- cannulization. In addition, most of the thrombus in the portal vein has resolved. The patient will be placed on anti coagulation in 3 hr.   Electronically Signed   By: Maryclare Bean M.D.   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  EXAM: IR ULTRASOUND GUIDANCE VASC ACCESS RIGHT; THROMBOECTOMY MECHANICAL VENOUS; IR INFUSION THROMBOL VENOUS INITIAL (MS); PORTAL VENOGRAPHY; ADDITIONAL ARTERIOGRAPHY  FLUOROSCOPY TIME:  8 min and 6 seconds.  MEDICATIONS AND MEDICAL HISTORY: Versed 4 mg, Fentanyl 175 mcg.  As antibiotic prophylaxis, Ancef was ordered pre-procedure and administered intravenously within one hour of incision.  ANESTHESIA/SEDATION: Moderate sedation time: 52 minutes  CONTRAST:  50 cc Omnipaque  300.  Carbon dioxide.  PROCEDURE: The procedure, risks, benefits, and alternatives  were explained to the patient. Questions regarding the procedure were encouraged and answered. The patient understands and consents to the procedure.  The right flank was prepped with Betadine in a sterile fashion, and a sterile drape was applied covering the operative field. A sterile gown and sterile gloves were used for the procedure.  1% lidocaine was utilized for local infiltration. Under sonographic guidance, a 22 gauge Chiba needle was inserted into the peripheral aspect of the main right portal vein. Contrast was injected confirming needle position. The needle was removed over a 018 wire. The Accustick dilator was inserted. A small amount of contrast was injected. Carbon dioxide portal venography was performed.  The Accustick dilator was exchanged over the 3 J for a 5 [place] Kumpe the catheter. This was advanced into the lower portal vein and venography was performed. The catheter was advanced into the upper SMV and contrast was injected. There is partially occlusive thrombus in the main portal vein and occlusive thrombus in the SMV.  The catheter was carefully advanced over a glidewire to the small venous branches of the distal ileum in the right lower quadrant. Contrast was injected demonstrating stasis of flow within the small branches adjacent to the intestines and complete occlusion within the larger more central branches.  The Kumpe the catheter was removed over a Rosen wire. A 6 French sheath was advanced over the wire to the upper main portal vein. The Angiojet was utilized to mechanically lysed the thrombus for 96 seconds.  Subsequently, a 90 cm 20 cm infusion length 5 French multi side-hole catheter was advanced over the [place] wire to the right lower quadrant. The catheter is positioned along side the thrombus extending from the main portal vein to the ileum. Lysis with 0.5 mg tPA was then instituted. Heparin protocol was resumed per peripheral access.  FINDINGS: Portal venography confirms partial  occlusion of the main portal vein and complete occlusion of the SMV. Splenic vein is patent.  Subsequent venogram images confirmed occlusion of mesenteric brain branches to the right lower quadrant. Peripheral branches adjacent to the intestine are patent with stasis of flow.  The neck series of images demonstrate placement of a 6 French sheath and multi side hole catheter along side the thrombus, extending from the main portal vein to the small mesenteric venous branches in the right lower quadrant.  COMPLICATIONS: None  IMPRESSION: Successful mechanical and pharmacological lysis of thrombus in the main portal vein, superior mesenteric vein, and venous branches in the right lower quadrant. TPA had 0.5 milligrams/hour will be instituted via multi side-hole catheter along side the thrombus.   Electronically Signed   By: Maryclare Bean M.D.   On: 08/08/2014 17:21   Ir Jacolyn Reedy F/u Eval Art/ven Alwyn Ren Day (ms)  08/09/2014   CLINICAL DATA:  49 year old with SMV and portal vein thrombus and concern for bowel ischemia. Catheter-directed thrombolytic therapy was started on 08/08/2014. Patient returns for follow-up angiography.  EXAM: THROMBOLYTIC FOLLOW-UP ANGIOGRAPHY  Physician: Stephan Minister. Henn, MD  FLUOROSCOPY TIME:  6 min and 18 seconds, 1090 mGy  MEDICATIONS: 2 mg Versed, 100 mcg fentanyl. A radiology nurse monitored the patient for moderate sedation.  ANESTHESIA/SEDATION: Moderate sedation time: 60 min  PROCEDURE: Patient was placed supine. The existing catheters in the right lateral abdomen were prepped and draped in sterile fashion. Maximal barrier sterile technique was utilized including caps, mask, sterile gowns, sterile gloves, sterile drape, hand hygiene and  skin antiseptic. Contrast was initially injected through the infusion wire port. Subsequently, the infusion wire was removed and contrast was injected through the infusion catheter. These initial angiographic images were limited. As a result, the infusion catheter  was removed for a Kumpe catheter over a [place] wire. Additional angiography was performed. A new 135 cm infusion catheter with 20 cm of infusion length was advanced over a Rosen wire. Tip was placed in the right lower quadrant and similar placed from the previous study. Catheters were secured to the patient. IV heparin and catheter-directed tPA was again started. TPA was started at 0.5 mg/hour.  FINDINGS: There is hepatopetal flow in the SMV but there continues to be a large amount of thrombus throughout the dilated SMV. There is flow within the main portal vein and the proximal left and right portal veins. Suspect nonocclusive clot in the main portal vein.  Multiple dilated loops of small bowel throughout the abdomen  COMPLICATIONS: None  IMPRESSION: There is flow in the superior mesenteric vein but there continues to be a large amount of irregular thrombus. As a result, a new infusion catheter was placed. Will continue catheter-directed thrombolytic therapy overnight with IV heparin. Plan to have the patient return to Interventional Radiology on 08/10/2014 for follow-up angiography and possibly mechanical thrombectomy.  Patient continues to have multiple dilated loops of small bowel throughout the abdomen. The patient's symptoms are improving with catheter-directed thrombolytic therapy but the radiographic appearance of the abdomen is still concerning for a bowel obstruction.   Electronically Signed   By: Markus Daft M.D.   On: 08/09/2014 18:16   Ir Jacolyn Reedy F/u Eval Art/ven Final Day (ms)  08/11/2014   CLINICAL DATA:  Forty-eight hr portal vein and superior mesenteric vein lysis check.  EXAM: IR THROMB F/U EVAL ART/VEN FINAL DAY; THROMBOECTOMY MECHANICAL VENOUS; PTA VENOUS; IR EMBO VENOUS NOT HEMORR HEMANG INC GUIDE ROADMAPPING  FLUOROSCOPY TIME:  Sixteen minutes  MEDICATIONS AND MEDICAL HISTORY: Versed two mg, Fentanyl 100 mcg.  Additional Medications: None.  ANESTHESIA/SEDATION: Moderate sedation time: 60  minutes  CONTRAST:  50 cc Omnipaque 300  PROCEDURE: The procedure, risks, benefits, and alternatives were explained to the patient. Questions regarding the procedure were encouraged and answered. The patient understands and consents to the procedure.  The right flank was prepped with Betadine in a sterile fashion, and a sterile drape was applied covering the operative field. A sterile gown and sterile gloves were used for the procedure.  Initially, an NG tube was placed. The tip was positioned in the body of the stomach. This resulted in significant decompression.  The inner wire of the infusion catheter was removed and contrast was injected for venography.  The catheter was retracted over a Rosen wire and the Angiojet was utilized through the persistent thrombus for 156 seconds.  Repeat venography through a Kumpe catheter was performed.  The Rosen wire was reinserted and a 5 mm balloon was utilized to dilate the thrombus within the superior mesenteric and portal veins.  Repeat venography demonstrates improvement.  A pigtail catheter was advanced over the wire to the main portal vein an utilizing twirl technique, additional mechanical thrombectomy was performed. Final venography was performed.  A copy catheter was advanced through the sheath and into the main portal vein. The catheter was retracted into the transhepatic tract. The tract was embolized with Gel-Foam slurry and several the 038 embolization coils. Two 4 mm coils and four 6 mm coils were utilized.  FINDINGS: Initial venography demonstrates improvement  overall. The most significant finding was innumerable collateral venous structures now opacifying from the peripheral aspect of the superior mesenteric venous system. 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  After performing the Angiojet, angioplasty, and pigtail technique, there was again noted to be significant improvement  after each subsequent intervention. There is some partial thrombus in the most inferior portal vein, but most of the portal venous thrombus had resolved. There is chronic thrombus along the superior mesenteric vein of but there is no a large channel through the thrombus.  COMPLICATIONS: None  IMPRESSION: Successful lysed cysts and thrombectomy of thrombus throughout the portal vein and superior mesenteric vein. At the conclusion of the treatment, there are now multiple collateral venous structures within the periphery of the superior mesenteric venous system that are opacifying an allowing for decompression. There is chronic thrombus within the superior mesenteric vein, but there is now a channel with re- cannulization. In addition, most of the thrombus in the portal vein has resolved. The patient will be placed on anti coagulation in 3 hr.   Electronically Signed   By: Maryclare Bean M.D.   On: 08/11/2014 09:53    CBC  Recent Labs Lab 08/26/14 1248  08/28/14 1242 08/28/14 2251 08/29/14 0540 08/30/14 0308 08/31/14 0430  WBC 9.3  < > 5.5 6.9 6.6 8.4 6.3  HGB 11.7*  < > 11.0* 12.0* 11.3* 10.2* 9.3*  HCT 35.6*  < > 34.8* 37.6* 34.9* 32.1* 29.6*  PLT 277  < > 303 337 312 318 367  MCV 87.3  < > 89.0 88.5 90.2 89.2 90.8  MCH 28.7  < > 28.1 28.2 29.2 28.3 28.5  MCHC 32.9  < > 31.6 31.9 32.4 31.8 31.4  RDW 14.3  < > 14.1 14.1 14.0 14.1 14.1  LYMPHSABS 2.7  --   --   --   --   --   --   MONOABS 1.0  --   --   --   --   --   --   EOSABS 0.0  --   --   --   --   --   --   BASOSABS 0.0  --   --   --   --   --   --   < > = values in this interval not displayed.  Chemistries   Recent Labs Lab 08/25/14 1722 08/26/14 1248 08/27/14 0325 08/29/14 0940 08/30/14 0308 08/31/14 0430  NA 133* 128* 135 138 137 138  K 3.7 4.0 3.8 4.7 4.7 3.8  CL 94* 89* 97 104 102 103  CO2 32 29 28 30 28 27   GLUCOSE 127* 136* 111* 114* 102* 87  BUN 22 27* 18 10 9 10   CREATININE 1.88* 1.75* 1.42* 1.30 1.14 1.03  CALCIUM  8.9 8.7 8.0* 8.0* 7.9* 7.9*  AST 32 26 19  --   --   --   ALT 35 28 21  --   --   --   ALKPHOS 78 70 59  --   --   --   BILITOT 1.1 1.2 1.1  --   --   --    ------------------------------------------------------------------------------------------------------------------ estimated creatinine clearance is 120.1 mL/min (by C-G formula based on Cr of 1.03). ------------------------------------------------------------------------------------------------------------------ No results for input(s): HGBA1C in the last 72 hours. ------------------------------------------------------------------------------------------------------------------ No results for input(s): CHOL, HDL, LDLCALC, TRIG, CHOLHDL, LDLDIRECT in the last 72 hours. ------------------------------------------------------------------------------------------------------------------ No results for input(s):  TSH, T4TOTAL, T3FREE, THYROIDAB in the last 72 hours.  Invalid input(s): FREET3 ------------------------------------------------------------------------------------------------------------------ No results for input(s): VITAMINB12, FOLATE, FERRITIN, TIBC, IRON, RETICCTPCT in the last 72 hours.  Coagulation profile  Recent Labs Lab 08/26/14 1835 08/27/14 0325  INR 1.33 1.31    No results for input(s): DDIMER in the last 72 hours.  Cardiac Enzymes  Recent Labs Lab 08/26/14 1353  TROPONINI <0.03   ------------------------------------------------------------------------------------------------------------------ Invalid input(s): POCBNP    Torianna Junio D.O. on 08/31/2014 at 9:23 AM  Between 7am to 7pm - Pager - [phone]  After 7pm go to [email or web address] - password [email or web address]  And look for the night coverage person covering for me after hours  [facility] Office  [phone]

## 2014-08-31 NOTE — Progress Notes (Signed)
[place] for Heparin  Indication: SMV/PV thrombosis   Allergies  Allergen Reactions  . Ace Inhibitors Cough  . Lipitor [Atorvastatin] Other (See Comments)    myalgia  . Zofran [Ondansetron Hcl] Nausea And Vomiting  . Codeine Itching    Patient Measurements: Height: 6\' 2"  (188 cm) Weight: 267 lb 10.2 oz (121.4 kg) IBW/kg (Calculated) : 82.2  Vital Signs: Temp: 98.2 F (36.8 C) (01/28 0838) Temp Source: Oral (01/28 0838) BP: 139/82 mmHg (01/28 0838) Pulse Rate: 93 (01/28 0838)  Labs:  Recent Labs  08/29/14 0540 08/29/14 0940 08/30/14 0308 08/31/14 0430  HGB 11.3*  --  10.2* 9.3*  HCT 34.9*  --  32.1* 29.6*  PLT 312  --  318 367  HEPARINUNFRC 0.57  --  0.34 0.33  CREATININE  --  1.30 1.14 1.03    Estimated Creatinine Clearance: 120.1 mL/min (by C-G formula based on Cr of 1.03).   Assessment: 49 yo male with h/o portal vein thrombosis Pradaxa on hold, s/p appendectomy and small resection 1/25, for anticoagulation. Currently tolerating a few oral medications including aspirin and Plavix. Currently on heparin infusion at 2950 units/hr. HL this AM remained therapeutic but on lower end of goal range two days in a row. Hgb continues to trend down - likely post op?Marland Kitchen Plt remain stable. RN reports no s/s of bleeding   Goal of Therapy:  Heparin level 0.3-0.7 units/ml Monitor platelets by anticoagulation protocol: Yes   Plan:  Increase heparin infusion slightly to 3000 units/hr  Follow up daily HL, CBC, and s/s of bleeding  F/U when surgery is ok to resume [place], PharmD., BCPS Clinical Pharmacist Pager [phone]

## 2014-08-31 NOTE — Progress Notes (Signed)
3 Days Post-Op  Subjective: Comfortable Having BM's  Objective: Vital signs in last 24 hours: Temp:  [98.7 F (37.1 C)-98.9 F (37.2 C)] 98.7 F (37.1 C) (01/28 0432) Pulse Rate:  [90-96] 91 (01/28 0432) Resp:  [14-20] 20 (01/28 0432) BP: (125-140)/(66-78) 125/66 mmHg (01/28 0432) SpO2:  [98 %-100 %] 99 % (01/28 0432) Last BM Date: 08/27/14  Intake/Output from previous day: 01/27 0701 - 01/28 0700 In: 2988 [I.V.:2508; IV Piggyback:450] Out: 1565 [Urine:1375; Emesis/NG output:190] Intake/Output this shift:    Abdomen soft, incision clean  Lab Results:   Recent Labs  08/30/14 0308 08/31/14 0430  WBC 8.4 6.3  HGB 10.2* 9.3*  HCT 32.1* 29.6*  PLT 318 367   BMET  Recent Labs  08/30/14 0308 08/31/14 0430  NA 137 138  K 4.7 3.8  CL 102 103  CO2 28 27  GLUCOSE 102* 87  BUN 9 10  CREATININE 1.14 1.03  CALCIUM 7.9* 7.9*   PT/INR No results for input(s): LABPROT, INR in the last 72 hours. ABG No results for input(s): PHART, HCO3 in the last 72 hours.  Invalid input(s): PCO2, PO2  Studies/Results: No results found.  Anti-infectives: Anti-infectives    Start     Dose/Rate Route Frequency Ordered Stop   08/27/14 1200  vancomycin (VANCOCIN) IVPB 1000 mg/200 mL premix  Status:  Discontinued     1,000 mg200 mL/hr over 60 Minutes Intravenous Every 12 hours 08/26/14 2125 08/27/14 0759   08/27/14 0830  metroNIDAZOLE (FLAGYL) IVPB 500 mg     500 mg100 mL/hr over 60 Minutes Intravenous 3 times per day 08/27/14 0759     08/26/14 2300  vancomycin (VANCOCIN) 2,500 mg in sodium chloride 0.9 % 500 mL IVPB     2,500 mg250 mL/hr over 120 Minutes Intravenous  Once 08/26/14 2125 08/27/14 0050   08/26/14 2200  piperacillin-tazobactam (ZOSYN) IVPB 3.375 g     3.375 g12.5 mL/hr over 240 Minutes Intravenous 3 times per day 08/26/14 2125        Assessment/Plan: s/p Procedure(s): APPENDECTOMY LAPAROSCOPIC (N/A) SMALL BOWEL RESECTION (N/A) LAPAROSCOPY DIAGNOSTIC  D/c NG  and foley Start clear liquid diet.  Will advance very slowly  LOS: 5 days    Dylan [name] A 08/31/2014

## 2014-08-31 NOTE — Progress Notes (Signed)
49 y.o. male with history of inferior NSTEMI status post PCI to the mid RCA followed by redo PCI more proximally in the RCA in 2013 with treatment of proximal in-stent restenosis with a new stent proximally. He has been lost to follow-up for cardiology and has not been taking Plavix for over a year. He was admitted on January 1st 2016 for acute onset abdominal pain and was found to have a thrombosed superior mesenteric vein with extension into the portal vein. This was thought to be related to testosterone replacement. Treated with thrombolytic lysis VIR. Following this the patient developed acute onset of substernal chest pressure on the evening of the 10th/morning left of the 11th. He was diagnosed with NSTEMI. His cardiac catheterization was notable for RCA in-stent thrombosis with up to 70% luminal narrowing. He underwent PTCA with minimal amount of residual thrombus. Now pt admitted to the hospital with RUQ abdominal pain secondary to small bowel perforation --> s/p partial bowel Resection on 08/28/2013.  POD #3  Subjective:  Feels lot better today. Plan is for a GGT to come out and he is excited.  Was able take by mouth medications yesterday. Still having flatus but no BM  Objective:  Vital Signs in the last 24 hours: Temp:  [98.2 F (36.8 C)-98.9 F (37.2 C)] 98.8 F (37.1 C) (01/28 1332) Pulse Rate:  [89-93] 89 (01/28 1332) Resp:  [12-20] 18 (01/28 1332) BP: (125-146)/(66-88) 146/88 mmHg (01/28 1332) SpO2:  [96 %-100 %] 97 % (01/28 1332)  Intake/Output from previous day: 01/27 0701 - 01/28 0700 In: 2988 [I.V.:2508; IV Piggyback:450] Out: 1610 [Urine:1375; Emesis/NG output:190] Intake/Output from this shift: Total I/O In: 134.5 [I.V.:134.5] Out: 200 [Urine:200]  Physical Exam: General appearance: alert, cooperative, appears stated age, no distress and mildly obese; NG tube still in place Neck: no adenopathy, no carotid bruit and no JVD Lungs: clear to auscultation bilaterally  and normal percussion bilaterally Heart: regular rate and rhythm, S1, S2 normal, no murmur, click, rub or gallop and normal apical impulse Abdomen: soft, appropriately tender post-op. ++ Bowel sounds Extremities: extremities normal, atraumatic, no cyanosis or edema Pulses: 2+ and symmetric Neurologic: Grossly normal  Lab Results:  Recent Labs  08/30/14 0308 08/31/14 0430  WBC 8.4 6.3  HGB 10.2* 9.3*  PLT 318 367    Recent Labs  08/30/14 0308 08/31/14 0430  NA 137 138  K 4.7 3.8  CL 102 103  CO2 28 27  GLUCOSE 102* 87  BUN 9 10  CREATININE 1.14 1.03   No results for input(s): TROPONINI in the last 72 hours.  Invalid input(s): CK, MB Hepatic Function Panel No results for input(s): PROT, ALBUMIN, AST, ALT, ALKPHOS, BILITOT, BILIDIR, IBILI in the last 72 hours. No results for input(s): CHOL in the last 72 hours. No results for input(s): PROTIME in the last 72 hours.  Imaging: No new studies.  Cardiac Studies: Tele - SR/STach 90-110, PACs  Assessment/Plan:  Principal Problem:   Perforation bowel Active Problems:   HTN (hypertension)   Acute embolism and thrombosis of vein   Mesenteric vein thrombosis   CAD S/P percutaneous coronary angioplasty   Intestinal perforation   Abdominal pain in male  Per Surgery - plan is NPO with NGT in place x 1 more day.  Still NPO> but RN reports meds given.   CAD - s/p PTCA of ISR/thrombosis this month in setting of NSTEMI (& AMV-PV thrombosis) -- all thought to be related to recent Testosterone injections. --> No active  angina Sx.  Agree with IV heparin until taking PO --> once taking PO and okay from surgical standpoint, would restart Pradaxa .   Is currently back on aspirin plus Plavix. Would continue ASA/Plavix x 1 month total given the extent of In-stent thrombosis noted @ cath; then OK to stop ASA  Pradaxa for SMV thrombosis - started once cleared by surgery  Essential HTN - currently only on IV Lopressor --> HRs are in  90s & BP in 120-130 mmHg range.  Increased to Lopressor to 5 mg IV q 8hr.  (was on Toprol 50 mg @ home in addition to Benicar HCTZ, once taking PO, would start BB of as Lopresor 25 mg BID & convert back to Toprol)  Restart furosemide & fenofibrate on d/c.  Not much new to add --> will follow & make recommendations as needed.   LOS: 5 days    Dylan [name], Dylan [name] 08/31/2014, 2:55 PM

## 2014-08-31 NOTE — Telephone Encounter (Signed)
Received call from patient stating that he is currently admitted at [facility] in the ICU/stepdown department. He wanted to make Dr. Alvy Bimler aware. Inbox message sent to Dr. Alvy Bimler with this information. Told patient to please call us back if he has any further needs or concerns - patient agreeable to this.

## 2014-08-31 NOTE — Telephone Encounter (Signed)
S/W PATIENT AND GAVE NP APPT FOR 02/25 @ 1:45 W/DR. Milo

## 2014-09-01 ENCOUNTER — Encounter (HOSPITAL_COMMUNITY): Payer: Self-pay | Admitting: Cardiology

## 2014-09-01 DIAGNOSIS — I214 Non-ST elevation (NSTEMI) myocardial infarction: Secondary | ICD-10-CM

## 2014-09-01 DIAGNOSIS — E785 Hyperlipidemia, unspecified: Secondary | ICD-10-CM | POA: Diagnosis present

## 2014-09-01 HISTORY — DX: Hyperlipidemia, unspecified: E78.5

## 2014-09-01 LAB — BASIC METABOLIC PANEL
Anion gap: 6 (ref 5–15)
BUN: 9 mg/dL (ref 6–23)
CO2: 28 mmol/L (ref 19–32)
CREATININE: 0.92 mg/dL (ref 0.50–1.35)
Calcium: 8.1 mg/dL — ABNORMAL LOW (ref 8.4–10.5)
Chloride: 106 mmol/L (ref 96–112)
GFR calc Af Amer: >90 mL/min (ref >90)
Glucose, Bld: 98 mg/dL (ref 70–99)
Potassium: 3.7 mmol/L (ref 3.5–5.1)
SODIUM: 140 mmol/L (ref 135–145)

## 2014-09-01 LAB — CBC
HEMATOCRIT: 30 % — AB (ref 39.0–52.0)
Hemoglobin: 9.4 g/dL — ABNORMAL LOW (ref 13.0–17.0)
MCH: 27.7 pg (ref 26.0–34.0)
MCHC: 31.3 g/dL (ref 30.0–36.0)
MCV: 88.5 fL (ref 78.0–100.0)
Platelets: 386 10*3/uL (ref 150–400)
RBC: 3.39 MIL/uL — AB (ref 4.22–5.81)
RDW: 14.2 % (ref 11.5–15.5)
WBC: 4.8 10*3/uL (ref 4.0–10.5)

## 2014-09-01 LAB — BLOOD PRODUCT ORDER (VERBAL) VERIFICATION

## 2014-09-01 LAB — CULTURE, BLOOD (ROUTINE X 2)
CULTURE: NO GROWTH
Culture: NO GROWTH

## 2014-09-01 LAB — HEPARIN LEVEL (UNFRACTIONATED): HEPARIN UNFRACTIONATED: 0.49 [IU]/mL (ref 0.30–0.70)

## 2014-09-01 MED ORDER — OXYCODONE-ACETAMINOPHEN 5-325 MG PO TABS
1.0000 | ORAL_TABLET | ORAL | Status: DC | PRN
  Administered 2014-09-01 (×2): 2 via ORAL
  Filled 2014-09-01 (×2): qty 2

## 2014-09-01 MED ORDER — METOPROLOL TARTRATE 25 MG PO TABS
25.0000 mg | ORAL_TABLET | Freq: Two times a day (BID) | ORAL | Status: DC
Start: 2014-09-01 — End: 2014-09-02
  Administered 2014-09-01 (×2): 25 mg via ORAL
  Filled 2014-09-01 (×5): qty 1

## 2014-09-01 MED ORDER — FUROSEMIDE 20 MG PO TABS
20.0000 mg | ORAL_TABLET | Freq: Every day | ORAL | Status: DC
  Administered 2014-09-01: 20 mg via ORAL
  Filled 2014-09-01 (×2): qty 1

## 2014-09-01 MED ORDER — HYDROMORPHONE HCL 1 MG/ML IJ SOLN
1.0000 mg | INTRAMUSCULAR | Status: DC | PRN
  Administered 2014-09-01 – 2014-09-02 (×9): 1 mg via INTRAVENOUS
  Filled 2014-09-01 (×9): qty 1

## 2014-09-01 MED ORDER — WHITE PETROLATUM GEL
Status: AC
  Administered 2014-09-01: 0.2
  Filled 2014-09-01: qty 1

## 2014-09-01 MED ORDER — DABIGATRAN ETEXILATE MESYLATE 150 MG PO CAPS
150.0000 mg | ORAL_CAPSULE | Freq: Two times a day (BID) | ORAL | Status: DC
  Administered 2014-09-01 – 2014-09-02 (×3): 150 mg via ORAL
  Filled 2014-09-01 (×5): qty 1

## 2014-09-01 NOTE — Progress Notes (Signed)
[facility]                                                                              Patient Demographics  Dylan [name], is a 49 y.o. male, DOB - 07/21/1966, VHQ:[id number]  Admit date - 08/26/2014   Admitting Physician Hosie Poisson, MD  Outpatient Primary MD for the patient is [name], [name], MD  LOS - 6   Chief Complaint  Patient presents with  . Abdominal Pain      HPI on 08/26/2014 by Dr. Hosie Poisson Dylan [name] is a 49 y.o. 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 He was seen by his PCP yesterday and labs were drawn and sent home and asked to come back if pain worsens. He denies any other complaints. In ED, oral contrast CT abdomen and pelvis revealed bowel perforation with mesenteric LAD. Surgery was consulted by EDP and we were requested to admit the patient. Cardiology consulted pre and peri operative medication management. Pt reports taking meds , including plavix yesterday morning .    Assessment & Plan  Perforated Bowel with periotonitis/Cdifficle -General surgery consulted and appreciated -S/p resection, appendectomy -Patient now having BM -NGTube removed, foley catheter removed -Patient started on clear liquid diet by surgery -Continue Zosyn -C. Difficile PCR positive, continue flagyl  CAD  -S/p PTCA Jan 2016 (NSTEMI, AMV-PV thrombosis, thought to be secondary to testosterone injections) -Cardiology consulted and appreciated -Patient currently chest pain free -Will discontinue heparin today and place patient back on pradaxa -Continue Aspirin and plavix  (combination for 1 month, then plavix thereafter) -Will restart PO metoprolol  Hypertension/tachycardia -Discontinue IV lopressor and start metoprolol 25mg  BID PO  Hyperlipidemia -Will need to resume an appropriate and Lovaza at discharge  Normocytic Anemia -Possible dilutional vs surgery -Baseline Hb 10-11.  -Iron 21, Ferritin 812 (likely acute phase reactant) -Will start iron supplementation once patient is able to tolerate more than a liquid diet.  Code Status: Full  Family Communication: None at bedside  Disposition Plan: Admitted.  Patient will be transferred to medical floor today.  Time Spent in minutes   30 minutes  Procedures  Appendectomy, Small bowel resection, Laparoscopy    Consults   General surgery, Dr. Ninfa Linden [facility]  DVT Prophylaxis  Heparin --> Pradaxa  Lab Results  Component Value Date   PLT 386 09/01/2014    Medications  Scheduled Meds: . antiseptic oral rinse  7 mL Mouth Rinse BID  . aspirin  81 mg Oral Daily  . clopidogrel  75 mg Oral Daily  . dabigatran  150 mg Oral Q12H  . furosemide  20 mg Oral Daily  . HYDROmorphone PCA 0.3 mg/mL   Intravenous 6 times per day  . metoprolol tartrate  25 mg Oral BID  . metronidazole  500 mg Intravenous 3 times per day  . piperacillin-tazobactam (ZOSYN)  IV  3.375 g Intravenous 3 times per day   Continuous Infusions: . heparin 3,000 Units/hr (08/31/14 2333)   PRN Meds:.acetaminophen, diphenhydrAMINE **OR** diphenhydrAMINE, HYDROmorphone (DILAUDID) injection, naloxone **AND** sodium chloride, ondansetron (ZOFRAN) IV, promethazine  Antibiotics    Anti-infectives    Start     Dose/Rate Route Frequency Ordered Stop   08/27/14 1200  vancomycin (VANCOCIN) IVPB 1000 mg/200 mL premix  Status:  Discontinued     1,000 mg200 mL/hr over 60 Minutes Intravenous Every 12 hours 08/26/14 2125 08/27/14 0759   08/27/14 0830  metroNIDAZOLE (FLAGYL) IVPB 500 mg     500 mg100 mL/hr over 60 Minutes Intravenous 3  times per day 08/27/14 0759     08/26/14 2300  vancomycin (VANCOCIN) 2,500 mg in sodium chloride 0.9 % 500 mL IVPB     2,500 mg250 mL/hr over 120 Minutes Intravenous  Once 08/26/14 2125 08/27/14 0050   08/26/14 2200  piperacillin-tazobactam (ZOSYN) IVPB 3.375 g     3.375 g12.5 mL/hr over 240 Minutes Intravenous 3 times per day 08/26/14 2125         Subjective:   [name] seen and examined today.  Patient states he is feeling better and needs to have a bowel movement this morning.  He denies chest pain, SOB, headache, dizziness.  Objective:   Filed Vitals:   09/01/14 0014 09/01/14 0324 09/01/14 0400 09/01/14 0431  BP: 111/53   128/70  Pulse:      Temp: 98.5 F (36.9 C)   98.4 F (36.9 C)  TempSrc: Oral   Oral  Resp:  16 15   Height:      Weight:      SpO2:  99% 99%     Wt Readings from Last 3 Encounters:  08/26/14 121.4 kg (267 lb 10.2 oz)  08/16/14 131.7 kg (290 lb 5.5 oz)  04/19/13 129.4 kg (285 lb 4.4 oz)     Intake/Output Summary (Last 24 hours) at 09/01/14 0729 Last data filed at 09/01/14 0700  Gross per 24 hour  Intake 2788.5 ml  Output    200 ml  Net 2588.5 ml    Exam  General: Well developed, well nourished, NAD, appears stated age  12: NCAT,mucous membranes moist.   Cardiovascular: S1 S2 auscultated, no rubs, murmurs or gallops. Regular rate and rhythm.  Respiratory: Clear to auscultation bilaterally with equal chest rise  Abdomen: Soft, mildly tender to palpation, + bowel sounds, clean bandages  Extremities: warm dry without cyanosis clubbing. Trace pedal edema  Neuro: AAOx3, nonfocal  Psych: Appropriate affect and mood  Data Review   Micro Results Recent Results (from the past 240 hour(s))  Culture, blood (routine x 2)     Status: None (Preliminary result)   Collection Time: 08/25/14  5:47 PM  Result Value Ref Range Status   Specimen Description BLOOD ARM LEFT  Final   Special Requests BOTTLES DRAWN AEROBIC AND ANAEROBIC 10CC   Final   Culture   Final           BLOOD CULTURE RECEIVED NO GROWTH TO DATE CULTURE WILL BE HELD FOR 5 DAYS BEFORE ISSUING A FINAL NEGATIVE REPORT Performed at [facility]    Report Status PENDING  Incomplete  Culture, blood (routine x 2)     Status: None (Preliminary result)   Collection Time: 08/25/14  5:58 PM  Result Value Ref Range Status   Specimen Description BLOOD ARM RIGHT  Final   Special Requests   Final    BOTTLES  DRAWN AEROBIC AND ANAEROBIC BLUE 10CC RED 5CC   Culture   Final           BLOOD CULTURE RECEIVED NO GROWTH TO DATE CULTURE WILL BE HELD FOR 5 DAYS BEFORE ISSUING A FINAL NEGATIVE REPORT Performed at [facility]    Report Status PENDING  Incomplete  Culture, blood (routine x 2)     Status: None (Preliminary result)   Collection Time: 08/26/14 10:00 PM  Result Value Ref Range Status   Specimen Description BLOOD LEFT HAND  Final   Special Requests BOTTLES DRAWN AEROBIC AND ANAEROBIC 10CC EA  Final   Culture   Final           BLOOD CULTURE RECEIVED NO GROWTH TO DATE CULTURE WILL BE HELD FOR 5 DAYS BEFORE ISSUING A FINAL NEGATIVE REPORT Performed at [facility]    Report Status PENDING  Incomplete  Culture, blood (routine x 2)     Status: None (Preliminary result)   Collection Time: 08/26/14 10:00 PM  Result Value Ref Range Status   Specimen Description BLOOD RIGHT ANTECUBITAL  Final   Special Requests BOTTLES DRAWN AEROBIC AND ANAEROBIC 10CC EA  Final   Culture   Final           BLOOD CULTURE RECEIVED NO GROWTH TO DATE CULTURE WILL BE HELD FOR 5 DAYS BEFORE ISSUING A FINAL NEGATIVE REPORT Performed at [facility]    Report Status PENDING  Incomplete  MRSA PCR Screening     Status: None   Collection Time: 08/26/14 10:20 PM  Result Value Ref Range Status   MRSA by PCR NEGATIVE NEGATIVE Final    Comment:        The GeneXpert MRSA Assay (FDA approved for NASAL specimens only), is one component of a comprehensive MRSA  colonization surveillance program. It is not intended to diagnose MRSA infection nor to guide or monitor treatment for MRSA infections.   Clostridium Difficile by PCR     Status: Abnormal   Collection Time: 08/27/14  7:59 AM  Result Value Ref Range Status   C difficile by pcr POSITIVE (A) NEGATIVE Final    Comment: CRITICAL RESULT CALLED TO, READ BACK BY AND VERIFIED WITH: B.RONCALLO RN [id number] 08/27/14 E.[email or web address]     Radiology Reports Ct Abdomen Pelvis Wo Contrast  08/26/2014   CLINICAL DATA:  Right upper quadrant pain. Nausea, vomiting and diarrhea. Pain for 2 days. Pain has been increasing.  EXAM: CT ABDOMEN AND PELVIS WITHOUT CONTRAST  TECHNIQUE: Multidetector CT imaging of the abdomen and pelvis was performed following the standard protocol without IV contrast.  COMPARISON:  CT, 08/07/2014  FINDINGS: There are significant inflammatory changes in the right lower quadrant surrounding the distal ileum and cecum. The appendix is not definitively seen. It may be hidden within free fluid in the right lower quadrant. Small bowel loops show no wall thickening and there are associated prominent mesenteric lymph nodes. A small amount of free fluid is associated with the inflammatory change in the right lower quadrant. Along 1 loop of the thick-walled, right lower quadrant small bowel there is extraluminal air. There is minimal extraluminal contrast along an area of apparent wall dehiscence.  Small right pleural effusion. There is dependent subsegmental atelectasis in the lower lobes, right greater than left. Heart is normal in size.  Dense material lies in the right liver lobe, new from the prior exam, reflecting the replaced endovascular coils. No other liver abnormality.  Spleen, gallbladder, pancreas, adrenal  glands:  Normal.  2.9 cm low-density left renal mass with an associated calcification, unchanged. Is protrudes from the posterior midpole. No other renal abnormality. Normal ureters. Bladder is  unremarkable.  Remainder the bowel is unremarkable.  No free air. Hernia mesh lies along the anterior lower abdominal wall.  Degenerative changes noted of the visualized spine. No osteoblastic or osteolytic lesions.  IMPRESSION: 1. There are significant inflammatory in the right lower quadrant centered on thick-walled loops of small bowel. One affected loop has a dehiscent wall where there is a small amount extraluminal contrast and air. A small amount of fluid is associated with the inflammatory right lower quadrant changes. Given recent endovascular procedures, the possibility of the abnormal small bowel loops being due to ischemia should be considered. The etiology could be infectious or inflammatory. 2. Appendix not visualized. Findings appear to be due to small bowel and not from a ruptured appendicitis.   Electronically Signed   By: Lajean Manes M.D.   On: 08/26/2014 17:20   Ct Abdomen Pelvis Wo Contrast  08/07/2014   CLINICAL DATA:  Generalized abdominal pain and cramping. Nausea and vomiting. Superior mesenteric vein thrombosis.  EXAM: CT ABDOMEN AND PELVIS WITHOUT CONTRAST  TECHNIQUE: Multidetector CT imaging of the abdomen and pelvis was performed following the standard protocol without IV contrast.  COMPARISON:  CT on 08/04/2014 and MRI on 08/05/2014  FINDINGS: Lower chest:  New small right pleural effusion.  Hepatobiliary:  No mass visualized on this non-contrast exam.  Pancreas: No mass or inflammatory process visualized on this non-contrast exam.  Spleen:  Within normal limits in size.  Adrenal Glands:  No mass identified.  Kidneys/Urinary tract: No evidence of urolithiasis or hydronephrosis. 3 cm complex cyst with mild mural calcifications in the posterior upper pole the left kidney is stable.  Stomach/Bowel/Peritoneum: Mild increase and mesenteric edema and mild ascites is seen since previous study. Persistent wall thickening is seen involving multiple small bowel loops in the right abdomen. In  the setting of superior mesenteric vein thrombosis, this is suspicious for bowel ischemia. Increased dilatation of proximal small bowel is also seen. No evidence of pneumatosis or free air.  Vascular/Lymphatic: No pathologically enlarged lymph nodes identified. Superior mesenteric vein remains distended, consistent with venous thrombosis as demonstrated on recent MRI.  Reproductive:  No mass or other significant abnormality noted.  Other:  None.  Musculoskeletal:  No suspicious bone lesions identified.  IMPRESSION: Persistent small bowel wall thickening in the right abdomen, suspicious for bowel ischemia in the setting of SMV thrombosis which was demonstrated on recent MRI.  Increased proximal small bowel dilatation, suspicious for small bowel obstruction.  Increase in diffuse mesenteric edema and ascites.  Critical Value/emergent results were called by telephone at the time of interpretation on 08/07/2014 at 2:37 pm to Dr. Louellen Molder , who verbally acknowledged these results.   Electronically Signed   By: Earle Gell M.D.   On: 08/07/2014 14:37   Ct Abdomen Pelvis Wo Contrast  08/04/2014   CLINICAL DATA:  Generalized abdomen pain, cramps, nausea, vomiting.  EXAM: CT ABDOMEN AND PELVIS WITHOUT CONTRAST  TECHNIQUE: Multidetector CT imaging of the abdomen and pelvis was performed following the standard protocol without IV contrast. There is oral contrast in the stomach only.  COMPARISON:  July 06, 2002  FINDINGS: The liver, spleen, pancreas, gallbladder, adrenal glands and right kidney are normal. There is no hydronephrosis bilaterally. There is a 2.3 x 3 cm slight low-density lesion in the posterior midpole left kidney  with minimal rim calcification ; on the previous CT, a simple cysts was present in the same location. There is atherosclerosis of the abdominal aorta without aneurysmal dilatation. There is no abdominal lymphadenopathy.  There are several loops of abnormal enlarged thick walled small bowel  loops with surrounding inflammation and fluid in the right lower quadrant. There is no free air. The appendix is not definitely seen. The colon is normal.  Fluid-filled bladder is normal. Small amount of free fluid is identified in the pelvis. The lung bases are clear. Degenerative joint changes of the spine are noted.  IMPRESSION: Several loops of abnormal enlarged thick walled small bowel loops with surrounding inflammation and fluid in the right lower quadrant. The findings are nonspecific but can be due to infectious or inflammatory etiology.   Electronically Signed   By: Abelardo Diesel M.D.   On: 08/04/2014 07:37   Dg Abd 1 View  08/25/2014   CLINICAL DATA:  Abdominal pain for 3 weeks.  EXAM: ABDOMEN - 1 VIEW  COMPARISON:  08/12/2014  FINDINGS: There are a few dilated loops of small bowel within the left upper quadrant of the abdomen which measure up to 3.9 cm. When compared with the previous exam the degree of bowel distention is significantly improved. Hernia mesh overlies the pelvis.  IMPRESSION: 1. A few persistent dilated loops of small bowel in the left upper quadrant.   Electronically Signed   By: Kerby Moors M.D.   On: 08/25/2014 18:25   Mr Abdomen Wo Contrast  08/05/2014   CLINICAL DATA:  Abdominal pain nausea and cramping. eval for mesenteric vein thrombosis. Pt is 300lbs and extremely claus.  EXAM: MRI/ MRA ABDOMEN WITHOUT CONTRAST  TECHNIQUE: Multiplanar, multiecho pulse sequences of the abdomen were obtained WITHOUT intravenous contrast. Angiographic images of abdomen were obtained using MRA technique WITHOUT intravenous contrast.Pre meds were given. Pt unable to stay awake for breath hold sequances. Was attemting to repeat axial ssfse lower in to the pelvis when pt became spastic and crawling out of the scanner. Unable to obtain any further imaging  : COMPARISON:  CT 08/04/2014 AND EARLIER STUDIES  FINDINGS: There is occlusive thrombus through the visualized superior mesenteric vein and  main portal vein, terminating at the portal bifurcation. There may be small amount of nonocclusive thrombus extending into the left portal vein. Normal flow signal in the splenic vein.  Limited assessment of the abdominal aorta and proximal visceral and renal branches grossly unremarkable. IVC is patent.  There is a small amount perihepatic ,perisplenic, right lower quadrant mesenteric, and pelvic ascites. No focal liver lesion identified. 2.6 cm fluid signal partially exophytic probable cyst from the interpolar region left kidney. Right kidney, adrenal glands, pancreas, and nondilated gallbladder unremarkable.  IMPRESSION:  1. Occlusive thrombus in superior mesenteric vein extending through the main portal vein. 2. Abdominal ascites 3. Left renal cyst.   Electronically Signed   By: Arne Cleveland M.D.   On: 08/05/2014 15:34   Mr Jodene Nam Abdomen Wo Contrast  08/05/2014   CLINICAL DATA:  Abdominal pain nausea and cramping. eval for mesenteric vein thrombosis. Pt is 300lbs and extremely claus.  EXAM: MRI/ MRA ABDOMEN WITHOUT CONTRAST  TECHNIQUE: Multiplanar, multiecho pulse sequences of the abdomen were obtained WITHOUT intravenous contrast. Angiographic images of abdomen were obtained using MRA technique WITHOUT intravenous contrast.Pre meds were given. Pt unable to stay awake for breath hold sequances. Was attemting to repeat axial ssfse lower in to the pelvis when pt became spastic and crawling  out of the scanner. Unable to obtain any further imaging  : COMPARISON:  CT 08/04/2014 AND EARLIER STUDIES  FINDINGS: There is occlusive thrombus through the visualized superior mesenteric vein and main portal vein, terminating at the portal bifurcation. There may be small amount of nonocclusive thrombus extending into the left portal vein. Normal flow signal in the splenic vein.  Limited assessment of the abdominal aorta and proximal visceral and renal branches grossly unremarkable. IVC is patent.  There is a small amount  perihepatic ,perisplenic, right lower quadrant mesenteric, and pelvic ascites. No focal liver lesion identified. 2.6 cm fluid signal partially exophytic probable cyst from the interpolar region left kidney. Right kidney, adrenal glands, pancreas, and nondilated gallbladder unremarkable.  IMPRESSION:  1. Occlusive thrombus in superior mesenteric vein extending through the main portal vein. 2. Abdominal ascites 3. Left renal cyst.   Electronically Signed   By: Arne Cleveland M.D.   On: 08/05/2014 15:34   Ir Angiogram Selective Each Additional Vessel  08/08/2014   CLINICAL DATA:  Superior mesenteric vein and portal vein DVT with bowel ischemia.  EXAM: IR ULTRASOUND GUIDANCE VASC ACCESS RIGHT; THROMBOECTOMY MECHANICAL VENOUS; IR INFUSION THROMBOL VENOUS INITIAL (MS); PORTAL VENOGRAPHY; ADDITIONAL ARTERIOGRAPHY  FLUOROSCOPY TIME:  8 min and 6 seconds.  MEDICATIONS AND MEDICAL HISTORY: Versed 4 mg, Fentanyl 175 mcg.  As antibiotic prophylaxis, Ancef was ordered pre-procedure and administered intravenously within one hour of incision.  ANESTHESIA/SEDATION: Moderate sedation time: 52 minutes  CONTRAST:  50 cc Omnipaque 300.  Carbon dioxide.  PROCEDURE: The procedure, risks, benefits, and alternatives were explained to the patient. Questions regarding the procedure were encouraged and answered. The patient understands and consents to the procedure.  The right flank was prepped with Betadine in a sterile fashion, and a sterile drape was applied covering the operative field. A sterile gown and sterile gloves were used for the procedure.  1% lidocaine was utilized for local infiltration. Under sonographic guidance, a 22 gauge Chiba needle was inserted into the peripheral aspect of the main right portal vein. Contrast was injected confirming needle position. The needle was removed over a 018 wire. The Accustick dilator was inserted. A small amount of contrast was injected. Carbon dioxide portal venography was performed.  The  Accustick dilator was exchanged over the 3 J for a 5 [place] Kumpe the catheter. This was advanced into the lower portal vein and venography was performed. The catheter was advanced into the upper SMV and contrast was injected. There is partially occlusive thrombus in the main portal vein and occlusive thrombus in the SMV.  The catheter was carefully advanced over a glidewire to the small venous branches of the distal ileum in the right lower quadrant. Contrast was injected demonstrating stasis of flow within the small branches adjacent to the intestines and complete occlusion within the larger more central branches.  The Kumpe the catheter was removed over a Rosen wire. A 6 French sheath was advanced over the wire to the upper main portal vein. The Angiojet was utilized to mechanically lysed the thrombus for 96 seconds.  Subsequently, a 90 cm 20 cm infusion length 5 French multi side-hole catheter was advanced over the [place] wire to the right lower quadrant. The catheter is positioned along side the thrombus extending from the main portal vein to the ileum. Lysis with 0.5 mg tPA was then instituted. Heparin protocol was resumed per peripheral access.  FINDINGS: Portal venography confirms partial occlusion of the main portal vein and complete occlusion of the  SMV. Splenic vein is patent.  Subsequent venogram images confirmed occlusion of mesenteric brain branches to the right lower quadrant. Peripheral branches adjacent to the intestine are patent with stasis of flow.  The neck series of images demonstrate placement of a 6 French sheath and multi side hole catheter along side the thrombus, extending from the main portal vein to the small mesenteric venous branches in the right lower quadrant.  COMPLICATIONS: None  IMPRESSION: Successful mechanical and pharmacological lysis of thrombus in the main portal vein, superior mesenteric vein, and venous branches in the right lower quadrant. TPA had 0.5 milligrams/hour will  be instituted via multi side-hole catheter along side the thrombus.   Electronically Signed   By: Maryclare Bean M.D.   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  EXAM: IR ULTRASOUND GUIDANCE VASC ACCESS RIGHT; THROMBOECTOMY MECHANICAL VENOUS; IR INFUSION THROMBOL VENOUS INITIAL (MS); PORTAL VENOGRAPHY; ADDITIONAL ARTERIOGRAPHY  FLUOROSCOPY TIME:  8 min and 6 seconds.  MEDICATIONS AND MEDICAL HISTORY: Versed 4 mg, Fentanyl 175 mcg.  As antibiotic prophylaxis, Ancef was ordered pre-procedure and administered intravenously within one hour of incision.  ANESTHESIA/SEDATION: Moderate sedation time: 52 minutes  CONTRAST:  50 cc Omnipaque 300.  Carbon dioxide.  PROCEDURE: The procedure, risks, benefits, and alternatives were explained to the patient. Questions regarding the procedure were encouraged and answered. The patient understands and consents to the procedure.  The right flank was prepped with Betadine in a sterile fashion, and a sterile drape was applied covering the operative field. A sterile gown and sterile gloves were used for the procedure.  1% lidocaine was utilized for local infiltration. Under sonographic guidance, a 22 gauge Chiba needle was inserted into the peripheral aspect of the main right portal vein. Contrast was injected confirming needle position. The needle was removed over a 018 wire. The Accustick dilator was inserted. A small amount of contrast was injected. Carbon dioxide portal venography was performed.  The Accustick dilator was exchanged over the 3 J for a 5 [place] Kumpe the catheter. This was advanced into the lower portal vein and venography was performed. The catheter was advanced into the upper SMV and contrast was injected. There is partially occlusive thrombus in the main portal vein and occlusive thrombus in the SMV.  The catheter was carefully advanced over a glidewire to  the small venous branches of the distal ileum in the right lower quadrant. Contrast was injected demonstrating stasis of flow within the small branches adjacent to the intestines and complete occlusion within the larger more central branches.  The Kumpe the catheter was removed over a Rosen wire. A 6 French sheath was advanced over the wire to the upper main portal vein. The Angiojet was utilized to mechanically lysed the thrombus for 96 seconds.  Subsequently, a 90 cm 20 cm infusion length 5 French multi side-hole catheter was advanced over the [place] wire to the right lower quadrant. The catheter is positioned along side the thrombus extending from the main portal vein to the ileum. Lysis with 0.5 mg tPA was then instituted. Heparin protocol was resumed per peripheral access.  FINDINGS: Portal venography confirms partial occlusion of the main portal vein and complete occlusion of the SMV. Splenic vein is patent.  Subsequent venogram images confirmed occlusion of mesenteric brain branches to the right lower quadrant. Peripheral branches adjacent to the intestine are patent with stasis of flow.  The neck series of images demonstrate placement of a 6 French sheath and multi side hole catheter along side the thrombus, extending from the main portal vein to the small mesenteric venous branches in the right lower quadrant.  COMPLICATIONS: None  IMPRESSION: Successful mechanical and pharmacological lysis of thrombus in the main portal vein, superior mesenteric vein, and venous branches in the right lower quadrant. TPA had 0.5 milligrams/hour will be instituted via multi side-hole catheter along side the thrombus.   Electronically Signed   By: Maryclare Bean M.D.   On: 08/08/2014 17:21   US Renal Port  08/05/2014   CLINICAL DATA:  Acute renal injury.  Hypertension.  EXAM: RENAL/URINARY TRACT ULTRASOUND COMPLETE  COMPARISON:  08/04/2014  FINDINGS: Right Kidney:  Length: 13.0 cm. Echogenicity within normal limits. No mass or  hydronephrosis visualized.  Left Kidney:  Length: 12.3 cm. Echogenicity within normal limits. No hydronephrosis. 2.2 by 1.7 cm complex exophytic lesion of the left mid kidney, as shown on CT scan.  Bladder:  Appears normal for degree of bladder distention.  Other: Small amount of ascites adjacent to the right hepatic lobe. 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  IMPRESSION: 1. 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 This could be confirmed with noncontrast 3D time-of-flight MRI of the mesenteric vasculature, if clinically warranted. 2. Small amount of ascites adjacent to the right hepatic lobe. 3. The 2.2 by 1.7 cm exophytic lesion of the left mid kidney is confirmed to be complex by ultrasound. I do note that there was a reasonably benign appearing cyst in this location on 07/06/2002. Although likely benign, the lesion is not completely specific on today's exam. These results were called by telephone at the time of interpretation on 08/05/2014 at 12:42 pm to Dr. Louellen Molder , who verbally acknowledged these results.   Electronically Signed   By: Sherryl Barters M.D.   On: 08/05/2014 12:46   Ir Pta Venous Right  08/11/2014   CLINICAL DATA:  Forty-eight hr portal vein and superior mesenteric vein lysis check.  EXAM: IR THROMB F/U EVAL ART/VEN FINAL DAY; THROMBOECTOMY MECHANICAL VENOUS; PTA VENOUS; IR EMBO VENOUS NOT HEMORR HEMANG INC GUIDE ROADMAPPING  FLUOROSCOPY TIME:  Sixteen minutes  MEDICATIONS AND MEDICAL HISTORY: Versed two mg, Fentanyl 100 mcg.  Additional Medications: None.  ANESTHESIA/SEDATION: Moderate sedation time: 60 minutes  CONTRAST:  50 cc Omnipaque 300  PROCEDURE: The procedure, risks, benefits, and alternatives were explained to the patient. Questions regarding the procedure  were encouraged and answered. The patient understands and consents to the procedure.  The right flank was prepped with Betadine in a sterile fashion, and a sterile drape was applied covering the operative field. A sterile gown and sterile gloves were used for the procedure.  Initially, an NG tube was placed. The tip was positioned in the body of the stomach. This resulted in significant decompression.  The inner wire of the infusion catheter was removed and contrast was injected for venography.  The catheter was retracted over a Rosen wire and the Angiojet was utilized through the persistent thrombus for 156 seconds.  Repeat venography through a Kumpe catheter was performed.  The Rosen wire was reinserted and a 5 mm balloon was utilized to dilate the thrombus within the superior mesenteric and portal veins.  Repeat venography  demonstrates improvement.  A pigtail catheter was advanced over the wire to the main portal vein an utilizing twirl technique, additional mechanical thrombectomy was performed. Final venography was performed.  A copy catheter was advanced through the sheath and into the main portal vein. The catheter was retracted into the transhepatic tract. The tract was embolized with Gel-Foam slurry and several the 038 embolization coils. Two 4 mm coils and four 6 mm coils were utilized.  FINDINGS: Initial venography demonstrates improvement overall. The most significant finding was innumerable collateral venous structures now opacifying from the peripheral aspect of the superior mesenteric venous system. 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  After performing the Angiojet, angioplasty, and pigtail technique, there was again noted to be significant improvement after each subsequent intervention. There is some partial thrombus in the most inferior portal vein, but most of the portal venous thrombus had resolved. There is  chronic thrombus along the superior mesenteric vein of but there is no a large channel through the thrombus.  COMPLICATIONS: None  IMPRESSION: Successful lysed cysts and thrombectomy of thrombus throughout the portal vein and superior mesenteric vein. At the conclusion of the treatment, there are now multiple collateral venous structures within the periphery of the superior mesenteric venous system that are opacifying an allowing for decompression. There is chronic thrombus within the superior mesenteric vein, but there is now a channel with re- cannulization. In addition, most of the thrombus in the portal vein has resolved. The patient will be placed on anti coagulation in 3 hr.   Electronically Signed   By: Maryclare Bean M.D.   On: 08/11/2014 09:53   Ir Thrombect Veno Mech Mod Sed  08/11/2014   CLINICAL DATA:  Forty-eight hr portal vein and superior mesenteric vein lysis check.  EXAM: IR THROMB F/U EVAL ART/VEN FINAL DAY; THROMBOECTOMY MECHANICAL VENOUS; PTA VENOUS; IR EMBO VENOUS NOT HEMORR HEMANG INC GUIDE ROADMAPPING  FLUOROSCOPY TIME:  Sixteen minutes  MEDICATIONS AND MEDICAL HISTORY: Versed two mg, Fentanyl 100 mcg.  Additional Medications: None.  ANESTHESIA/SEDATION: Moderate sedation time: 60 minutes  CONTRAST:  50 cc Omnipaque 300  PROCEDURE: The procedure, risks, benefits, and alternatives were explained to the patient. Questions regarding the procedure were encouraged and answered. The patient understands and consents to the procedure.  The right flank was prepped with Betadine in a sterile fashion, and a sterile drape was applied covering the operative field. A sterile gown and sterile gloves were used for the procedure.  Initially, an NG tube was placed. The tip was positioned in the body of the stomach. This resulted in significant decompression.  The inner wire of the infusion catheter was removed and contrast was injected for venography.  The catheter was retracted over a Rosen wire and the Angiojet  was utilized through the persistent thrombus for 156 seconds.  Repeat venography through a Kumpe catheter was performed.  The Rosen wire was reinserted and a 5 mm balloon was utilized to dilate the thrombus within the superior mesenteric and portal veins.  Repeat venography demonstrates improvement.  A pigtail catheter was advanced over the wire to the main portal vein an utilizing twirl technique, additional mechanical thrombectomy was performed. Final venography was performed.  A copy catheter was advanced through the sheath and into the main portal vein. The catheter was retracted into the transhepatic tract. The tract was embolized with Gel-Foam slurry and several the 038 embolization coils. Two 4 mm coils  and four 6 mm coils were utilized.  FINDINGS: Initial venography demonstrates improvement overall. The most significant finding was innumerable collateral venous structures now opacifying from the peripheral aspect of the superior mesenteric venous system. 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  After performing the Angiojet, angioplasty, and pigtail technique, there was again noted to be significant improvement after each subsequent intervention. There is some partial thrombus in the most inferior portal vein, but most of the portal venous thrombus had resolved. There is chronic thrombus along the superior mesenteric vein of but there is no a large channel through the thrombus.  COMPLICATIONS: None  IMPRESSION: Successful lysed cysts and thrombectomy of thrombus throughout the portal vein and superior mesenteric vein. At the conclusion of the treatment, there are now multiple collateral venous structures within the periphery of the superior mesenteric venous system that are opacifying an allowing for decompression. There is chronic thrombus within the superior mesenteric vein, but there is now a channel with re- cannulization.  In addition, most of the thrombus in the portal vein has resolved. The patient will be placed on anti coagulation in 3 hr.   Electronically Signed   By: Maryclare Bean M.D.   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  EXAM: IR ULTRASOUND GUIDANCE VASC ACCESS RIGHT; THROMBOECTOMY MECHANICAL VENOUS; IR INFUSION THROMBOL VENOUS INITIAL (MS); PORTAL VENOGRAPHY; ADDITIONAL ARTERIOGRAPHY  FLUOROSCOPY TIME:  8 min and 6 seconds.  MEDICATIONS AND MEDICAL HISTORY: Versed 4 mg, Fentanyl 175 mcg.  As antibiotic prophylaxis, Ancef was ordered pre-procedure and administered intravenously within one hour of incision.  ANESTHESIA/SEDATION: Moderate sedation time: 52 minutes  CONTRAST:  50 cc Omnipaque 300.  Carbon dioxide.  PROCEDURE: The procedure, risks, benefits, and alternatives were explained to the patient. Questions regarding the procedure were encouraged and answered. The patient understands and consents to the procedure.  The right flank was prepped with Betadine in a sterile fashion, and a sterile drape was applied covering the operative field. A sterile gown and sterile gloves were used for the procedure.  1% lidocaine was utilized for local infiltration. Under sonographic guidance, a 22 gauge Chiba needle was inserted into the peripheral aspect of the main right portal vein. Contrast was injected confirming needle position. The needle was removed over a 018 wire. The Accustick dilator was inserted. A small amount of contrast was injected. Carbon dioxide portal venography was performed.  The Accustick dilator was exchanged over the 3 J for a 5 [place] Kumpe the catheter. This was advanced into the lower portal vein and venography was performed. The catheter was advanced into the upper SMV and contrast was injected. There is partially occlusive thrombus in the main portal vein and occlusive thrombus in the SMV.  The  catheter was carefully advanced over a glidewire to the small venous branches of the distal ileum in the right lower quadrant. Contrast was injected demonstrating stasis of flow within the small branches adjacent to the intestines and complete occlusion within the larger more central branches.  The Kumpe the catheter was removed over a Rosen wire. A 6 French sheath was advanced over the wire to the upper main portal vein. The Angiojet was utilized to mechanically lysed the thrombus for 96 seconds.  Subsequently, a 90 cm 20 cm infusion length 5 French multi side-hole catheter was  advanced over the [facility] wire to the right lower quadrant. The catheter is positioned along side the thrombus extending from the main portal vein to the ileum. Lysis with 0.5 mg tPA was then instituted. Heparin protocol was resumed per peripheral access.  FINDINGS: Portal venography confirms partial occlusion of the main portal vein and complete occlusion of the SMV. Splenic vein is patent.  Subsequent venogram images confirmed occlusion of mesenteric brain branches to the right lower quadrant. Peripheral branches adjacent to the intestine are patent with stasis of flow.  The neck series of images demonstrate placement of a 6 French sheath and multi side hole catheter along side the thrombus, extending from the main portal vein to the small mesenteric venous branches in the right lower quadrant.  COMPLICATIONS: None  IMPRESSION: Successful mechanical and pharmacological lysis of thrombus in the main portal vein, superior mesenteric vein, and venous branches in the right lower quadrant. TPA had 0.5 milligrams/hour will be instituted via multi side-hole catheter along side the thrombus.   Electronically Signed   By: Maryclare Bean M.D.   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  EXAM: IR ULTRASOUND GUIDANCE VASC ACCESS RIGHT; THROMBOECTOMY  MECHANICAL VENOUS; IR INFUSION THROMBOL VENOUS INITIAL (MS); PORTAL VENOGRAPHY; ADDITIONAL ARTERIOGRAPHY  FLUOROSCOPY TIME:  8 min and 6 seconds.  MEDICATIONS AND MEDICAL HISTORY: Versed 4 mg, Fentanyl 175 mcg.  As antibiotic prophylaxis, Ancef was ordered pre-procedure and administered intravenously within one hour of incision.  ANESTHESIA/SEDATION: Moderate sedation time: 52 minutes  CONTRAST:  50 cc Omnipaque 300.  Carbon dioxide.  PROCEDURE: The procedure, risks, benefits, and alternatives were explained to the patient. Questions regarding the procedure were encouraged and answered. The patient understands and consents to the procedure.  The right flank was prepped with Betadine in a sterile fashion, and a sterile drape was applied covering the operative field. A sterile gown and sterile gloves were used for the procedure.  1% lidocaine was utilized for local infiltration. Under sonographic guidance, a 22 gauge Chiba needle was inserted into the peripheral aspect of the main right portal vein. Contrast was injected confirming needle position. The needle was removed over a 018 wire. The Accustick dilator was inserted. A small amount of contrast was injected. Carbon dioxide portal venography was performed.  The Accustick dilator was exchanged over the 3 J for a 5 [place] Kumpe the catheter. This was advanced into the lower portal vein and venography was performed. The catheter was advanced into the upper SMV and contrast was injected. There is partially occlusive thrombus in the main portal vein and occlusive thrombus in the SMV.  The catheter was carefully advanced over a glidewire to the small venous branches of the distal ileum in the right lower quadrant. Contrast was injected demonstrating stasis of flow within the small branches adjacent to the intestines and complete occlusion within the larger more central branches.  The Kumpe the catheter was removed over a Rosen wire. A 6 French sheath was advanced over  the wire to the upper main portal vein. The Angiojet was utilized to mechanically lysed the thrombus for 96 seconds.  Subsequently, a 90 cm 20 cm infusion length 5 French multi side-hole catheter was advanced over the Diaz wire to the right lower quadrant. The catheter is positioned along side the thrombus extending from the main portal vein to the ileum. Lysis with 0.5 mg  tPA was then instituted. Heparin protocol was resumed per peripheral access.  FINDINGS: Portal venography confirms partial occlusion of the main portal vein and complete occlusion of the SMV. Splenic vein is patent.  Subsequent venogram images confirmed occlusion of mesenteric brain branches to the right lower quadrant. Peripheral branches adjacent to the intestine are patent with stasis of flow.  The neck series of images demonstrate placement of a 6 French sheath and multi side hole catheter along side the thrombus, extending from the main portal vein to the small mesenteric venous branches in the right lower quadrant.  COMPLICATIONS: None  IMPRESSION: Successful mechanical and pharmacological lysis of thrombus in the main portal vein, superior mesenteric vein, and venous branches in the right lower quadrant. TPA had 0.5 milligrams/hour will be instituted via multi side-hole catheter along side the thrombus.   Electronically Signed   By: Maryclare Bean M.D.   On: 08/08/2014 17:21   Dg Chest Port 1 View  08/11/2014   CLINICAL DATA:  Hypoxia.  EXAM: PORTABLE CHEST - 1 VIEW  COMPARISON:  August 10, 2014.  FINDINGS: Stable cardiomediastinal silhouette. No pneumothorax or significant pleural effusion is noted. Nasogastric tube tip is seen in expected position of proximal stomach. Left lung is clear. Right lower lobe opacity noted on prior exam is slightly improved, although significant residual density remains concerning for atelectasis or pneumonia. Bony thorax appears intact.  IMPRESSION: Nasogastric tube tip seen in proximal stomach.  Improved right  basilar opacity is noted, although significant residual density remains consistent with pneumonia or subsegmental atelectasis. Continued radiographic follow-up is recommended until resolution.   Electronically Signed   By: Sabino Dick M.D.   On: 08/11/2014 08:30   Dg Chest Port 1 View  08/10/2014   CLINICAL DATA:  49 year old male with increasing shortness of breath  EXAM: PORTABLE CHEST - 1 VIEW  COMPARISON:  Prior chest x-ray 04/17/2013  FINDINGS: Low inspiratory volumes. Nonspecific right mid and lower lung airspace opacity. Suspect a small layering effusion. No pulmonary edema. Cardiac and mediastinal contours are within normal limits. No pneumothorax. No acute osseous abnormality.  IMPRESSION: Low inspiratory volumes with new right mid and lower lung opacities. Favored to reflect a small right layering pleural effusion with associated atelectasis. Right lower lobe infiltrate, or mild alveolar hemorrhage are also within the differential.   Electronically Signed   By: Jacqulynn Cadet M.D.   On: 08/10/2014 10:02   Dg Abd 2 Views  08/12/2014   CLINICAL DATA:  Adynamic ileus. Post catheter directed thrombolysis of the SMV and portal vein.  EXAM: ABDOMEN - 2 VIEW  COMPARISON:  08/11/2014 ; 08/10/2014; CT abdomen pelvis - 08/07/2014; abdominal MRI - 08/05/2014; catheter directed thrombolysis - 08/10/2014  FINDINGS: Enteric contrast is now seen extending throughout the colon to the level of the rectum. There is persistent marked gaseous distension of multiple upstream loops of small bowel with index loop within the left mid hemiabdomen measuring approximately 6.2 cm in maximal diameter. No definite pneumoperitoneum, pneumatosis or portal venous gas.  Enteric tube tip and side port projects over the gastric fundus.  Limited visualization lower thorax demonstrates a suspected small right-sided pleural effusion with associated right basilar heterogeneous/consolidative opacities.  Embolization coils overlie the  peripheral / lateral aspect of the right lobe of the liver. No definite abnormal intra-abdominal calcifications.  Post bilateral hernia repair.  No acute osseus abnormalities.  IMPRESSION: 1. Findings suggestive of persistent though potentially minimally improved ileus with interval transit of ingested contrast, now seen  extending to the level of the rectum. Given marked distention of multiple loops of small bowel, continued attention on follow-up is recommended. 2. Suspected small right-sided effusion with associated right basilar opacities, possibly atelectasis.   Electronically Signed   By: Sandi Mariscal M.D.   On: 08/12/2014 08:31   Dg Abd Acute W/chest  08/04/2014   CLINICAL DATA:  Abdominal pain  EXAM: ACUTE ABDOMEN SERIES (ABDOMEN 2 VIEW & CHEST 1 VIEW)  COMPARISON:  04/17/2013 chest x-ray  FINDINGS: The abdomen is essentially gasless, which limits the utility of radiography. Few bubbles noted in the right lower quadrant. There is no concerning intra-abdominal mass effect or calcification. No pneumoperitoneum. Previous lower abdominal wall mesh repair. Normal heart size and mediastinal contours. No acute infiltrate or edema. No effusion or pneumothorax. No acute osseous findings.  IMPRESSION: 1. There is no definitive obstruction, but this technique is limited by relatively gasless abdomen. Fluid dilated loops of bowel would be missed on this study. 2. Negative chest.   Electronically Signed   By: Jorje Guild M.D.   On: 08/04/2014 06:29   Dg Abd Portable 1v  08/27/2014   CLINICAL DATA:  Small bowel ischemia  EXAM: PORTABLE ABDOMEN - 1 VIEW  COMPARISON:  08/26/2014  FINDINGS: There has been progression of enteric contrast material from the small bowel into the colon and up to the rectum. A few air-filled loops of small bowel are identified within the left upper quadrant and right lower quadrant of the abdomen. No significant small bowel dilatation noted.  IMPRESSION: 1. Progression of enteric contrast  material up to the level of the rectum.   Electronically Signed   By: Kerby Moors M.D.   On: 08/27/2014 07:40   Dg Abd Portable 1v  08/11/2014   CLINICAL DATA:  Small bowel obstruction.  EXAM: PORTABLE ABDOMEN - 1 VIEW  COMPARISON:  August 10, 2014.  FINDINGS: There remains multiple dilated loops of small bowel concerning for distal small bowel obstruction. These are stable compared to prior exam. No significant colonic dilatation is noted. No renal calculi are noted.  IMPRESSION: Stable dilated small bowel loops are noted consistent with distal small bowel obstruction. Continued radiographic follow-up is recommended.   Electronically Signed   By: Sabino Dick M.D.   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 There remains some contrast within the proximal colon stable from the prior study. Postsurgical changes are noted. An infusion catheter is seen within the portal vein and superior mesenteric vein. No free air is seen.  IMPRESSION: Increasing small bowel dilatation when compared with the prior exam.   Electronically Signed   By: Inez Catalina M.D.   On: 08/10/2014 10:01   Dg Abd Portable 1v  08/05/2014   CLINICAL DATA:  Subsequent evaluation for right-sided abdominal pain with nausea vomiting and diarrhea  EXAM: PORTABLE ABDOMEN - 1 VIEW  COMPARISON:  08/04/2014  FINDINGS: Mild gaseous distention of the stomach. Minimal gas elsewhere in the abdomen. There is a small amount of oral contrast appreciated throughout the colon.  IMPRESSION: Nonspecific gas pattern.   Electronically Signed   By: Skipper Cliche M.D.   On: 08/05/2014 11:21   Ir Embo Venous Not Hemorr  [employer] Guide Roadmapping  08/11/2014   CLINICAL DATA:   Forty-eight hr portal vein and superior mesenteric vein lysis check.  EXAM: IR THROMB F/U EVAL ART/VEN FINAL DAY; THROMBOECTOMY MECHANICAL VENOUS; PTA VENOUS; IR EMBO VENOUS NOT HEMORR HEMANG INC GUIDE ROADMAPPING  FLUOROSCOPY TIME:  Sixteen minutes  MEDICATIONS AND MEDICAL HISTORY: Versed two mg, Fentanyl 100 mcg.  Additional Medications: None.  ANESTHESIA/SEDATION: Moderate sedation time: 60 minutes  CONTRAST:  50 cc Omnipaque 300  PROCEDURE: The procedure, risks, benefits, and alternatives were explained to the patient. Questions regarding the procedure were encouraged and answered. The patient understands and consents to the procedure.  The right flank was prepped with Betadine in a sterile fashion, and a sterile drape was applied covering the operative field. A sterile gown and sterile gloves were used for the procedure.  Initially, an NG tube was placed. The tip was positioned in the body of the stomach. This resulted in significant decompression.  The inner wire of the infusion catheter was removed and contrast was injected for venography.  The catheter was retracted over a Rosen wire and the Angiojet was utilized through the persistent thrombus for 156 seconds.  Repeat venography through a Kumpe catheter was performed.  The Rosen wire was reinserted and a 5 mm balloon was utilized to dilate the thrombus within the superior mesenteric and portal veins.  Repeat venography demonstrates improvement.  A pigtail catheter was advanced over the wire to the main portal vein an utilizing twirl technique, additional mechanical thrombectomy was performed. Final venography was performed.  A copy catheter was advanced through the sheath and into the main portal vein. The catheter was retracted into the transhepatic tract. The tract was embolized with Gel-Foam slurry and several the 038 embolization coils. Two 4 mm coils and four 6 mm coils were utilized.  FINDINGS: Initial venography demonstrates improvement overall. The  most significant finding was innumerable collateral venous structures now opacifying from the peripheral aspect of the superior mesenteric venous system. 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  After performing the Angiojet, angioplasty, and pigtail technique, there was again noted to be significant improvement after each subsequent intervention. There is some partial thrombus in the most inferior portal vein, but most of the portal venous thrombus had resolved. There is chronic thrombus along the superior mesenteric vein of but there is no a large channel through the thrombus.  COMPLICATIONS: None  IMPRESSION: Successful lysed cysts and thrombectomy of thrombus throughout the portal vein and superior mesenteric vein. At the conclusion of the treatment, there are now multiple collateral venous structures within the periphery of the superior mesenteric venous system that are opacifying an allowing for decompression. There is chronic thrombus within the superior mesenteric vein, but there is now a channel with re- cannulization. In addition, most of the thrombus in the portal vein has resolved. The patient will be placed on anti coagulation in 3 hr.   Electronically Signed   By: Maryclare Bean M.D.   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  EXAM: IR ULTRASOUND GUIDANCE VASC ACCESS RIGHT; THROMBOECTOMY MECHANICAL VENOUS; IR INFUSION THROMBOL VENOUS INITIAL (MS); PORTAL VENOGRAPHY; ADDITIONAL ARTERIOGRAPHY  FLUOROSCOPY TIME:  8 min and 6 seconds.  MEDICATIONS AND MEDICAL HISTORY: Versed 4 mg, Fentanyl 175 mcg.  As antibiotic prophylaxis, Ancef was  ordered pre-procedure and administered intravenously within one hour of incision.  ANESTHESIA/SEDATION: Moderate sedation time: 52 minutes  CONTRAST:  50 cc Omnipaque 300.  Carbon  dioxide.  PROCEDURE: The procedure, risks, benefits, and alternatives were explained to the patient. Questions regarding the procedure were encouraged and answered. The patient understands and consents to the procedure.  The right flank was prepped with Betadine in a sterile fashion, and a sterile drape was applied covering the operative field. A sterile gown and sterile gloves were used for the procedure.  1% lidocaine was utilized for local infiltration. Under sonographic guidance, a 22 gauge Chiba needle was inserted into the peripheral aspect of the main right portal vein. Contrast was injected confirming needle position. The needle was removed over a 018 wire. The Accustick dilator was inserted. A small amount of contrast was injected. Carbon dioxide portal venography was performed.  The Accustick dilator was exchanged over the 3 J for a 5 [place] Kumpe the catheter. This was advanced into the lower portal vein and venography was performed. The catheter was advanced into the upper SMV and contrast was injected. There is partially occlusive thrombus in the main portal vein and occlusive thrombus in the SMV.  The catheter was carefully advanced over a glidewire to the small venous branches of the distal ileum in the right lower quadrant. Contrast was injected demonstrating stasis of flow within the small branches adjacent to the intestines and complete occlusion within the larger more central branches.  The Kumpe the catheter was removed over a Rosen wire. A 6 French sheath was advanced over the wire to the upper main portal vein. The Angiojet was utilized to mechanically lysed the thrombus for 96 seconds.  Subsequently, a 90 cm 20 cm infusion length 5 French multi side-hole catheter was advanced over the [place] wire to the right lower quadrant. The catheter is positioned along side the thrombus extending from the main portal vein to the ileum. Lysis with 0.5 mg tPA was then instituted. Heparin protocol was resumed  per peripheral access.  FINDINGS: Portal venography confirms partial occlusion of the main portal vein and complete occlusion of the SMV. Splenic vein is patent.  Subsequent venogram images confirmed occlusion of mesenteric brain branches to the right lower quadrant. Peripheral branches adjacent to the intestine are patent with stasis of flow.  The neck series of images demonstrate placement of a 6 French sheath and multi side hole catheter along side the thrombus, extending from the main portal vein to the small mesenteric venous branches in the right lower quadrant.  COMPLICATIONS: None  IMPRESSION: Successful mechanical and pharmacological lysis of thrombus in the main portal vein, superior mesenteric vein, and venous branches in the right lower quadrant. TPA had 0.5 milligrams/hour will be instituted via multi side-hole catheter along side the thrombus.   Electronically Signed   By: Maryclare Bean M.D.   On: 08/08/2014 17:21   Ir Jacolyn Reedy F/u Eval Art/ven Alwyn Ren Day (ms)  08/09/2014   CLINICAL DATA:  49 year old with SMV and portal vein thrombus and concern for bowel ischemia. Catheter-directed thrombolytic therapy was started on 08/08/2014. Patient returns for follow-up angiography.  EXAM: THROMBOLYTIC FOLLOW-UP ANGIOGRAPHY  Physician: Stephan Minister. Henn, MD  FLUOROSCOPY TIME:  6 min and 18 seconds, 1090 mGy  MEDICATIONS: 2 mg Versed, 100 mcg fentanyl. A radiology nurse monitored the patient for moderate sedation.  ANESTHESIA/SEDATION: Moderate sedation time: 60 min  PROCEDURE: Patient was placed supine. The existing catheters in the right lateral abdomen were prepped  and draped in sterile fashion. Maximal barrier sterile technique was utilized including caps, mask, sterile gowns, sterile gloves, sterile drape, hand hygiene and skin antiseptic. Contrast was initially injected through the infusion wire port. Subsequently, the infusion wire was removed and contrast was injected through the infusion catheter. These initial  angiographic images were limited. As a result, the infusion catheter was removed for a Kumpe catheter over a [place] wire. Additional angiography was performed. A new 135 cm infusion catheter with 20 cm of infusion length was advanced over a Rosen wire. Tip was placed in the right lower quadrant and similar placed from the previous study. Catheters were secured to the patient. IV heparin and catheter-directed tPA was again started. TPA was started at 0.5 mg/hour.  FINDINGS: There is hepatopetal flow in the SMV but there continues to be a large amount of thrombus throughout the dilated SMV. There is flow within the main portal vein and the proximal left and right portal veins. Suspect nonocclusive clot in the main portal vein.  Multiple dilated loops of small bowel throughout the abdomen  COMPLICATIONS: None  IMPRESSION: There is flow in the superior mesenteric vein but there continues to be a large amount of irregular thrombus. As a result, a new infusion catheter was placed. Will continue catheter-directed thrombolytic therapy overnight with IV heparin. Plan to have the patient return to Interventional Radiology on 08/10/2014 for follow-up angiography and possibly mechanical thrombectomy.  Patient continues to have multiple dilated loops of small bowel throughout the abdomen. The patient's symptoms are improving with catheter-directed thrombolytic therapy but the radiographic appearance of the abdomen is still concerning for a bowel obstruction.   Electronically Signed   By: Markus Daft M.D.   On: 08/09/2014 18:16   Ir Jacolyn Reedy F/u Eval Art/ven Final Day (ms)  08/11/2014   CLINICAL DATA:  Forty-eight hr portal vein and superior mesenteric vein lysis check.  EXAM: IR THROMB F/U EVAL ART/VEN FINAL DAY; THROMBOECTOMY MECHANICAL VENOUS; PTA VENOUS; IR EMBO VENOUS NOT HEMORR HEMANG INC GUIDE ROADMAPPING  FLUOROSCOPY TIME:  Sixteen minutes  MEDICATIONS AND MEDICAL HISTORY: Versed two mg, Fentanyl 100 mcg.  Additional  Medications: None.  ANESTHESIA/SEDATION: Moderate sedation time: 60 minutes  CONTRAST:  50 cc Omnipaque 300  PROCEDURE: The procedure, risks, benefits, and alternatives were explained to the patient. Questions regarding the procedure were encouraged and answered. The patient understands and consents to the procedure.  The right flank was prepped with Betadine in a sterile fashion, and a sterile drape was applied covering the operative field. A sterile gown and sterile gloves were used for the procedure.  Initially, an NG tube was placed. The tip was positioned in the body of the stomach. This resulted in significant decompression.  The inner wire of the infusion catheter was removed and contrast was injected for venography.  The catheter was retracted over a Rosen wire and the Angiojet was utilized through the persistent thrombus for 156 seconds.  Repeat venography through a Kumpe catheter was performed.  The Rosen wire was reinserted and a 5 mm balloon was utilized to dilate the thrombus within the superior mesenteric and portal veins.  Repeat venography demonstrates improvement.  A pigtail catheter was advanced over the wire to the main portal vein an utilizing twirl technique, additional mechanical thrombectomy was performed. Final venography was performed.  A copy catheter was advanced through the sheath and into the main portal vein. The catheter was retracted into the transhepatic tract. The tract was embolized with Gel-Foam slurry  and several the 038 embolization coils. Two 4 mm coils and four 6 mm coils were utilized.  FINDINGS: Initial venography demonstrates improvement overall. The most significant finding was innumerable collateral venous structures now opacifying from the peripheral aspect of the superior mesenteric venous system. 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  After performing the Angiojet, angioplasty, and  pigtail technique, there was again noted to be significant improvement after each subsequent intervention. There is some partial thrombus in the most inferior portal vein, but most of the portal venous thrombus had resolved. There is chronic thrombus along the superior mesenteric vein of but there is no a large channel through the thrombus.  COMPLICATIONS: None  IMPRESSION: Successful lysed cysts and thrombectomy of thrombus throughout the portal vein and superior mesenteric vein. At the conclusion of the treatment, there are now multiple collateral venous structures within the periphery of the superior mesenteric venous system that are opacifying an allowing for decompression. There is chronic thrombus within the superior mesenteric vein, but there is now a channel with re- cannulization. In addition, most of the thrombus in the portal vein has resolved. The patient will be placed on anti coagulation in 3 hr.   Electronically Signed   By: Maryclare Bean M.D.   On: 08/11/2014 09:53    CBC  Recent Labs Lab 08/26/14 1248  08/28/14 2251 08/29/14 0540 08/30/14 0308 08/31/14 0430 09/01/14 0425  WBC 9.3  < > 6.9 6.6 8.4 6.3 4.8  HGB 11.7*  < > 12.0* 11.3* 10.2* 9.3* 9.4*  HCT 35.6*  < > 37.6* 34.9* 32.1* 29.6* 30.0*  PLT 277  < > 337 312 318 367 386  MCV 87.3  < > 88.5 90.2 89.2 90.8 88.5  MCH 28.7  < > 28.2 29.2 28.3 28.5 27.7  MCHC 32.9  < > 31.9 32.4 31.8 31.4 31.3  RDW 14.3  < > 14.1 14.0 14.1 14.1 14.2  LYMPHSABS 2.7  --   --   --   --   --   --   MONOABS 1.0  --   --   --   --   --   --   EOSABS 0.0  --   --   --   --   --   --   BASOSABS 0.0  --   --   --   --   --   --   < > = values in this interval not displayed.  Chemistries   Recent Labs Lab 08/25/14 1722 08/26/14 1248 08/27/14 0325 08/29/14 0940 08/30/14 0308 08/31/14 0430 09/01/14 0425  NA 133* 128* 135 138 137 138 140  K 3.7 4.0 3.8 4.7 4.7 3.8 3.7  CL 94* 89* 97 104 102 103 106  CO2 32 29 28 30 28 27 28   GLUCOSE 127*  136* 111* 114* 102* 87 98  BUN 22 27* 18 10 9 10 9   CREATININE 1.88* 1.75* 1.42* 1.30 1.14 1.03 0.92  CALCIUM 8.9 8.7 8.0* 8.0* 7.9* 7.9* 8.1*  AST 32 26 19  --   --   --   --   ALT 35 28 21  --   --   --   --   ALKPHOS 78 70 59  --   --   --   --   BILITOT 1.1 1.2 1.1  --   --   --   --    ------------------------------------------------------------------------------------------------------------------ estimated  creatinine clearance is 134.5 mL/min (by C-G formula based on Cr of 0.92). ------------------------------------------------------------------------------------------------------------------ No results for input(s): HGBA1C in the last 72 hours. ------------------------------------------------------------------------------------------------------------------ No results for input(s): CHOL, HDL, LDLCALC, TRIG, CHOLHDL, LDLDIRECT in the last 72 hours. ------------------------------------------------------------------------------------------------------------------ No results for input(s): TSH, T4TOTAL, T3FREE, THYROIDAB in the last 72 hours.  Invalid input(s): FREET3 ------------------------------------------------------------------------------------------------------------------  Recent Labs  08/31/14 0850  VITAMINB12 318  FOLATE 5.0  FERRITIN 812*  TIBC 114*  IRON 21*  RETICCTPCT 1.6    Coagulation profile  Recent Labs Lab 08/26/14 1835 08/27/14 0325  INR 1.33 1.31    No results for input(s): DDIMER in the last 72 hours.  Cardiac Enzymes  Recent Labs Lab 08/26/14 1353  TROPONINI <0.03   ------------------------------------------------------------------------------------------------------------------ Invalid input(s): POCBNP    Aristide Waggle D.O. on 09/01/2014 at 7:29 AM  Between 7am to 7pm - Pager - [phone]  After 7pm go to [email or web address] - password [email or web address]  And look for the night coverage person covering for me after hours  [facility]  Group Office  [phone]

## 2014-09-01 NOTE — Progress Notes (Signed)
Received from [facility]. Alert and oriented, not in any distress, VSS. Will continue to monitor.

## 2014-09-01 NOTE — Progress Notes (Signed)
Report called to unit 6N. Patient to be transported via wheelchair to Rm 6N30

## 2014-09-01 NOTE — Progress Notes (Signed)
49 y.o. male with history of inferior NSTEMI status post PCI to the mid RCA followed by redo PCI more proximally in the RCA in 2013 with treatment of proximal in-stent restenosis with a new stent proximally. He has been lost to follow-up for cardiology and has not been taking Plavix for over a year. He was admitted on January 1st 2016 for acute onset abdominal pain and was found to have a thrombosed superior mesenteric vein with extension into the portal vein. This was thought to be related to testosterone replacement. Treated with thrombolytic lysis VIR. Following this the patient developed acute onset of substernal chest pressure on the evening of the 10th/morning left of the 11th. He was diagnosed with NSTEMI. His cardiac catheterization was notable for RCA in-stent thrombosis with up to 70% luminal narrowing. He underwent PTCA with minimal amount of residual thrombus. Now pt admitted to the hospital with RUQ abdominal pain secondary to small bowel perforation --> s/p partial bowel Resection on 08/28/2013.  POD #4 - Partial Bowel Resection  Subjective:  Feels good - NGT out.  Foley out - ready to go!  Objective:  Vital Signs in the last 24 hours: Temp:  [98.2 F (36.8 C)-98.8 F (37.1 C)] 98.4 F (36.9 C) (01/29 0431) Pulse Rate:  [89-93] 89 (01/28 1658) Resp:  [12-20] 15 (01/29 0400) BP: (111-146)/(53-88) 128/70 mmHg (01/29 0431) SpO2:  [96 %-100 %] 99 % (01/29 0400)  Intake/Output from previous day: 01/28 0701 - 01/29 0700 In: 2788.5 [I.V.:2488.5; IV Piggyback:300] Out: 200 [Urine:200] Intake/Output from this shift:    Physical Exam: General appearance: alert, cooperative, appears stated age, no distress and mildly obese; NG tube still in place Neck: no adenopathy, no carotid bruit and no JVD Lungs: clear to auscultation bilaterally and normal percussion bilaterally Heart: regular rate and rhythm, S1, S2 normal, no murmur, click, rub or gallop and normal apical impulse Abdomen:  soft, appropriately tender post-op. ++ Bowel sounds Neurologic: Grossly normal  Lab Results:  Recent Labs  08/31/14 0430 09/01/14 0425  WBC 6.3 4.8  HGB 9.3* 9.4*  PLT 367 386    Recent Labs  08/31/14 0430 09/01/14 0425  NA 138 140  K 3.8 3.7  CL 103 106  CO2 27 28  GLUCOSE 87 98  BUN 10 9  CREATININE 1.03 0.92   No results for input(s): TROPONINI in the last 72 hours.  Invalid input(s): CK, MB Hepatic Function Panel No results for input(s): PROT, ALBUMIN, AST, ALT, ALKPHOS, BILITOT, BILIDIR, IBILI in the last 72 hours. No results for input(s): CHOL in the last 72 hours. No results for input(s): PROTIME in the last 72 hours.  Imaging: No new studies.  Cardiac Studies: Tele - SR/STach 80-100s, PACs  Assessment/Plan:  Principal Problem:   Perforation bowel Active Problems:   Coronary stent thrombosis   NSTEMI (non-ST elevated myocardial infarction) - 07/2010, 2013, 08/2014   Essential hypertension   Acute embolism and thrombosis of vein   Mesenteric vein thrombosis   Intestinal perforation   Abdominal pain in male  Hyperlipidemia  Finally has NGT out & taking PO with plan to transfer to Tele.  Main focus now is transitioning to PO meds from IV.    Would start with BB 1st along with ASA/Plavix & Pradaxa -- (please convert back to home Toprol 50 mg on d/c) Restart ARB once we are sure his BP is stable tomorrow prior to d/c (may need to start at lower dose - 1/2 of usual dose -  can be titrated up as OP)  Also restart fenofibrate - will need to address HLD as OP  We will arrange OP f/u with myself or Dr.Croitoru (likely with APP for initial visit)   LOS: 6 days    [name], Dylan W 09/01/2014, 8:22 AM

## 2014-09-01 NOTE — Progress Notes (Signed)
Patient noted incision site is bleeding, minimal bleeding noted,dry dressing applied and will continue to monitor.

## 2014-09-01 NOTE — Progress Notes (Signed)
UR completed.  Ilse Billman, RN BSN [facility] CCM Trauma/Neuro ICU Case Manager [phone]  

## 2014-09-01 NOTE — Progress Notes (Signed)
Attempt to call report to [place]. Nurse currently on another phone call. Will return the call

## 2014-09-01 NOTE — Progress Notes (Signed)
[place] for Heparin to Pradaxa Indication: SMV/PV thrombosis   Allergies  Allergen Reactions  . Ace Inhibitors Cough  . Lipitor [Atorvastatin] Other (See Comments)    myalgia  . Zofran [Ondansetron Hcl] Nausea And Vomiting  . Codeine Itching    Patient Measurements: Height: 6\' 2"  (188 cm) Weight: 267 lb 10.2 oz (121.4 kg) IBW/kg (Calculated) : 82.2  Vital Signs: Temp: 98.4 F (36.9 C) (01/29 0431) Temp Source: Oral (01/29 0431) BP: 128/70 mmHg (01/29 0431)  Labs:  Recent Labs  08/30/14 0308 08/31/14 0430 09/01/14 0425  HGB 10.2* 9.3* 9.4*  HCT 32.1* 29.6* 30.0*  PLT 318 367 386  HEPARINUNFRC 0.34 0.33 0.49  CREATININE 1.14 1.03 0.92    Estimated Creatinine Clearance: 134.5 mL/min (by C-G formula based on Cr of 0.92).   Assessment: 49 yo male with h/o portal vein thrombosis and recent PTCA for RCA in-stent thrombosis. PTA Pradaxa was on hold, s/p appendectomy and small bowel resection 1/25 and he was maintained on IV heparin. Pradaxa is to be resumed today. Renal function is stable. No bleeding noted, Hb has trended down to 9.4, platelets are normal.   Goal of Therapy:  Therapeutic anticoagulation Monitor platelets by anticoagulation protocol: Yes   Plan:  Discontinue heparin drip at the time of first dose of Pradaxa Pradaxa 150 mg PO q12h CBC q72h  [facility], Pharm.D., BCPS Clinical Pharmacist Pager: [phone] 09/01/2014 7:14 AM

## 2014-09-01 NOTE — Progress Notes (Signed)
4 Days Post-Op  Subjective: Tolerated clears Having BM's No fevers Minimal abdominal pain  Objective: Vital signs in last 24 hours: Temp:  [98.2 F (36.8 C)-98.8 F (37.1 C)] 98.4 F (36.9 C) (01/29 0431) Pulse Rate:  [89-93] 89 (01/28 1658) Resp:  [12-20] 15 (01/29 0400) BP: (111-146)/(53-88) 128/70 mmHg (01/29 0431) SpO2:  [96 %-100 %] 99 % (01/29 0400) Last BM Date: 08/27/14  Intake/Output from previous day: 01/28 0701 - 01/29 0700 In: 2788.5 [I.V.:2488.5; IV Piggyback:300] Out: 200 [Urine:200] Intake/Output this shift:    Abdomen soft, obese, minimally tender Incision with slight erythema  Lab Results:   Recent Labs  08/31/14 0430 09/01/14 0425  WBC 6.3 4.8  HGB 9.3* 9.4*  HCT 29.6* 30.0*  PLT 367 386   BMET  Recent Labs  08/31/14 0430 09/01/14 0425  NA 138 140  K 3.8 3.7  CL 103 106  CO2 27 28  GLUCOSE 87 98  BUN 10 9  CREATININE 1.03 0.92  CALCIUM 7.9* 8.1*   PT/INR No results for input(s): LABPROT, INR in the last 72 hours. ABG No results for input(s): PHART, HCO3 in the last 72 hours.  Invalid input(s): PCO2, PO2  Studies/Results: No results found.  Anti-infectives: Anti-infectives    Start     Dose/Rate Route Frequency Ordered Stop   08/27/14 1200  vancomycin (VANCOCIN) IVPB 1000 mg/200 mL premix  Status:  Discontinued     1,000 mg200 mL/hr over 60 Minutes Intravenous Every 12 hours 08/26/14 2125 08/27/14 0759   08/27/14 0830  metroNIDAZOLE (FLAGYL) IVPB 500 mg     500 mg100 mL/hr over 60 Minutes Intravenous 3 times per day 08/27/14 0759     08/26/14 2300  vancomycin (VANCOCIN) 2,500 mg in sodium chloride 0.9 % 500 mL IVPB     2,500 mg250 mL/hr over 120 Minutes Intravenous  Once 08/26/14 2125 08/27/14 0050   08/26/14 2200  piperacillin-tazobactam (ZOSYN) IVPB 3.375 g     3.375 g12.5 mL/hr over 240 Minutes Intravenous 3 times per day 08/26/14 2125        Assessment/Plan: s/p Procedure(s): APPENDECTOMY LAPAROSCOPIC (N/A) SMALL  BOWEL RESECTION (N/A) LAPAROSCOPY DIAGNOSTIC  Advance to full liquid diet D/c PCA Ok for any blood thinning meds from surgical standpoint.  Also, ok to transfer to the floor  LOS: 6 days    Shane Badeaux A 09/01/2014

## 2014-09-02 DIAGNOSIS — T82857D Stenosis of cardiac prosthetic devices, implants and grafts, subsequent encounter: Secondary | ICD-10-CM

## 2014-09-02 LAB — CBC
HEMATOCRIT: 29.6 % — AB (ref 39.0–52.0)
Hemoglobin: 9.4 g/dL — ABNORMAL LOW (ref 13.0–17.0)
MCH: 28.4 pg (ref 26.0–34.0)
MCHC: 31.8 g/dL (ref 30.0–36.0)
MCV: 89.4 fL (ref 78.0–100.0)
Platelets: 423 10*3/uL — ABNORMAL HIGH (ref 150–400)
RBC: 3.31 MIL/uL — AB (ref 4.22–5.81)
RDW: 14.2 % (ref 11.5–15.5)
WBC: 5.8 10*3/uL (ref 4.0–10.5)

## 2014-09-02 LAB — CULTURE, BLOOD (ROUTINE X 2)
CULTURE: NO GROWTH
Culture: NO GROWTH

## 2014-09-02 LAB — HEPARIN LEVEL (UNFRACTIONATED): Heparin Unfractionated: <0.1 IU/mL — ABNORMAL LOW (ref 0.30–0.70)

## 2014-09-02 MED ORDER — OLMESARTAN MEDOXOMIL-HCTZ 40-25 MG PO TABS: 0.5000 | ORAL_TABLET | Freq: Every day | ORAL | Status: DC

## 2014-09-02 MED ORDER — FERROUS SULFATE 325 (65 FE) MG PO TABS
325.0000 mg | ORAL_TABLET | Freq: Two times a day (BID) | ORAL | Status: DC
Start: 2014-09-02 — End: 2014-10-22

## 2014-09-02 MED ORDER — METRONIDAZOLE 500 MG PO TABS: 500.0000 mg | ORAL_TABLET | Freq: Three times a day (TID) | ORAL | Status: DC

## 2014-09-02 NOTE — Discharge Summary (Addendum)
Physician Discharge Summary  Dylan [name]:[id number] DOB: 02/07/1966 DOA: 08/26/2014  PCP: [name], MD  Admit date: 08/26/2014 Discharge date: 09/02/2014  Time spent: 45 minutes  Recommendations for Outpatient Follow-up:  Patient will be discharged to home.  He should continue his medications as prescribed.  Patient will need to follow up with his primary care physician within one week of discharge as well as Dr. Trevor Mace office in one week of discharge for staple removal.  He will also need to see Dr. Ninfa Linden in 2 weeks for followup appointment.  Cardiology will arrange for an appointment in 2-3 weeks.  He may resume heart healthy diet.  Patient should refrain from strenuous activity.  Discharge Diagnoses:  Principal Problem:   Perforation bowel Active Problems:   Essential hypertension   Acute embolism and thrombosis of vein   Mesenteric vein thrombosis   Coronary stent thrombosis   Intestinal perforation   Abdominal pain in male   NSTEMI (non-ST elevated myocardial infarction) - 07/2010, 2013, 08/2014   Hyperlipidemia with target LDL less than 70   Discharge Condition: Stable  Diet recommendation: Heart healthy  Filed Weights   08/26/14 2055 09/01/14 1339  Weight: 121.4 kg (267 lb 10.2 oz) 133.085 kg (293 lb 6.4 oz)    History of present illness:  on 08/26/2014 by Dr. Hosie Poisson Dylan [name] is a 49 y.o. 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 He was seen by his PCP yesterday and labs were drawn and sent home and asked to come back if pain worsens. He denies any  other complaints. In ED, oral contrast CT abdomen and pelvis revealed bowel perforation with mesenteric LAD. Surgery was consulted by EDP and we were requested to admit the patient. Cardiology consulted pre and peri operative medication management. Pt reports taking meds , including plavix yesterday morning .   Hospital Course:  Perforated Bowel with periotonitis/Cdifficle -General surgery consulted and appreciated -S/p resection, appendectomy -Patient now having BM -Diet advanced and patient was able to tolerated -Was on zosyn -C. Difficile PCR positive, continue flagyl (started 08/27/14) -Patient will need to followup with Dr. Ninfa Linden for staple removal in 1 week  CAD  -S/p PTCA Jan 2016 (NSTEMI, AMV-PV thrombosis, thought to be secondary to testosterone injections) -Cardiology consulted and appreciated -Patient currently chest pain free -Heparin discontinued and pradaxa restarted -Continue Aspirin and plavix (combination for 1 month, then plavix thereafter) -Continue metoprolol -Will need to followup with Dr. Ellyn Hack or Croitoru in 2-3 weeks which will be arranged for by cardiology  Hypertension/tachycardia -Continue metoprolol -Per cardiology, will restart ARB at half dose and can be titrated as an outpatient  Hyperlipidemia -Will need to resume an appropriate and Lovaza at discharge -Cardiology will followup with patient regarding hyperlipidemia as an outpatient  Normocytic Anemia -Possible dilutional vs surgery -Baseline Hb 10-11.  -Iron 21, Ferritin 812 (likely acute phase reactant) -Will discharge with iron supplementation.  Procedures: Appendectomy, Small bowel resection, Laparoscopy   Consultations: General surgery, Dr. Ninfa Linden Cardiology  Discharge Exam: Filed Vitals:   09/02/14 0627  BP: 129/77  Pulse: 87  Temp: 98.7 F (37.1 C)  Resp: 16  General: Well developed, well nourished, NAD, appears stated age  HEENT: NCAT,  mucous membranes  moist.  Cardiovascular: S1 S2 auscultated, no rubs, murmurs or gallops. Regular rate and rhythm.  Respiratory: Clear to auscultation bilaterally with equal chest rise  Abdomen: Soft, obese, mildly tender at incision site, nondistended, + bowel sounds, incision site- bloody discharge-no erythema  Extremities: warm dry without cyanosis clubbing or edema  Neuro: AAOx3, nonfocal  Psych: Normal affect and demeanor with intact judgement and insight  Discharge Instructions      Discharge Instructions    Discharge instructions    Complete by:  As directed   Patient will be discharged to home.  He should continue his medications as prescribed.  Patient will need to follow up with his primary care physician within one week of discharge as well as Dr. Trevor Mace office in one week of discharge for staple removal.  He will also need to see Dr. Ninfa Linden in 2 weeks for followup appointment.  Cardiology will arrange for an appointment in 2-3 weeks.  He may resume heart healthy diet.  Patient should refrain from strenuous activity.            Medication List    TAKE these medications        acetaminophen 500 MG tablet  Commonly known as:  TYLENOL  Take 1 tablet (500 mg total) by mouth every 4 (four) hours as needed for mild pain, fever or headache.     allopurinol 300 MG tablet  Commonly known as:  ZYLOPRIM  Take 1 tablet by mouth daily.     aspirin 81 MG chewable tablet  Chew 1 tablet (81 mg total) by mouth daily.     clopidogrel 75 MG tablet  Commonly known as:  PLAVIX  Take 1 tablet (75 mg total) by mouth daily with breakfast.     dabigatran 150 MG Caps capsule  Commonly known as:  PRADAXA  Take 1 capsule (150 mg total) by mouth every 12 (twelve) hours.     fenofibrate 160 MG tablet  Take 1 tablet (160 mg total) by mouth daily.     ferrous sulfate 325 (65 FE) MG tablet  Commonly known as:  FERROUSUL  Take 1 tablet (325 mg total) by mouth 2 (two) times daily with a meal.      furosemide 20 MG tablet  Commonly known as:  LASIX  Take 20 mg by mouth daily.     metoprolol succinate 50 MG 24 hr tablet  Commonly known as:  TOPROL-XL  Take 50 mg by mouth daily. Take with or immediately following a meal.     metroNIDAZOLE 500 MG tablet  Commonly known as:  FLAGYL  Take 1 tablet (500 mg total) by mouth 3 (three) times daily.     nitroGLYCERIN 0.4 MG SL tablet  Commonly known as:  NITROSTAT  Place 1 tablet (0.4 mg total) under the tongue every 5 (five) minutes x 3 doses as needed for chest pain.     olmesartan-hydrochlorothiazide 40-25 MG per tablet  Commonly known as:  BENICAR HCT  Take 0.5 tablets by mouth daily.     omega-3 acid ethyl esters 1 G capsule  Commonly known as:  LOVAZA  Take 1 capsule (1 g total) by mouth daily. <please make appointment>     oxymorphone 10 MG tablet  Commonly known as:  OPANA  Take 10 mg by mouth 2 (two) times daily as needed for pain.     oxymorphone 10 MG tablet  Commonly known as:  OPANA  Take 10 mg by mouth 2 (two) times daily.     traMADol 50 MG tablet  Commonly known as:  ULTRAM  Take 1-2 tablets (50-100 mg total) by mouth every 6 (six) hours as needed for moderate pain or severe pain.       Allergies  Allergen Reactions  . Ace Inhibitors Cough  . Lipitor [Atorvastatin] Other (See Comments)    myalgia  . Zofran [Ondansetron Hcl] Nausea And Vomiting  . Codeine Itching   Follow-up Information    Follow up with [place] A, MD In 2 weeks.   Specialty:  General Surgery   Why:  If no one has contacted you by Tues, please call the office to make an apt to get your staples out    Contact information:   [address] [place] [address] [phone]       Follow up with [name], [name], MD. Schedule an appointment as soon as possible for a visit in 1 week.   Specialty:  Internal Medicine   Why:  Hospital followup   Contact information:   [address] [place]  [address] [phone]        The results of significant diagnostics from this hospitalization (including imaging, microbiology, ancillary and laboratory) are listed below for reference.    Significant Diagnostic Studies: Ct Abdomen Pelvis Wo Contrast  08/26/2014   CLINICAL DATA:  Right upper quadrant pain. Nausea, vomiting and diarrhea. Pain for 2 days. Pain has been increasing.  EXAM: CT ABDOMEN AND PELVIS WITHOUT CONTRAST  TECHNIQUE: Multidetector CT imaging of the abdomen and pelvis was performed following the standard protocol without IV contrast.  COMPARISON:  CT, 08/07/2014  FINDINGS: There are significant inflammatory changes in the right lower quadrant surrounding the distal ileum and cecum. The appendix is not definitively seen. It may be hidden within free fluid in the right lower quadrant. Small bowel loops show no wall thickening and there are associated prominent mesenteric lymph nodes. A small amount of free fluid is associated with the inflammatory change in the right lower quadrant. Along 1 loop of the thick-walled, right lower quadrant small bowel there is extraluminal air. There is minimal extraluminal contrast along an area of apparent wall dehiscence.  Small right pleural effusion. There is dependent subsegmental atelectasis in the lower lobes, right greater than left. Heart is normal in size.  Dense material lies in the right liver lobe, new from the prior exam, reflecting the replaced endovascular coils. No other liver abnormality.  Spleen, gallbladder, pancreas, adrenal glands:  Normal.  2.9 cm low-density left renal mass with an associated calcification, unchanged. Is protrudes from the posterior midpole. No other renal abnormality. Normal ureters. Bladder is unremarkable.  Remainder the bowel is unremarkable.  No free air. Hernia mesh lies along the anterior lower abdominal wall.  Degenerative changes noted of the visualized spine. No osteoblastic or osteolytic lesions.  IMPRESSION:  1. There are significant inflammatory in the right lower quadrant centered on thick-walled loops of small bowel. One affected loop has a dehiscent wall where there is a small amount extraluminal contrast and air. A small amount of fluid is associated with the inflammatory right lower quadrant changes. Given recent endovascular procedures, the possibility of the abnormal small bowel loops being due to ischemia should be considered. The etiology could be infectious or inflammatory. 2. Appendix not visualized. Findings appear to be due to small bowel and not from a ruptured appendicitis.  Electronically Signed   By: Lajean Manes M.D.   On: 08/26/2014 17:20   Ct Abdomen Pelvis Wo Contrast  08/07/2014   CLINICAL DATA:  Generalized abdominal pain and cramping. Nausea and vomiting. Superior mesenteric vein thrombosis.  EXAM: CT ABDOMEN AND PELVIS WITHOUT CONTRAST  TECHNIQUE: Multidetector CT imaging of the abdomen and pelvis was performed following the standard protocol without IV contrast.  COMPARISON:  CT on 08/04/2014 and MRI on 08/05/2014  FINDINGS: Lower chest:  New small right pleural effusion.  Hepatobiliary:  No mass visualized on this non-contrast exam.  Pancreas: No mass or inflammatory process visualized on this non-contrast exam.  Spleen:  Within normal limits in size.  Adrenal Glands:  No mass identified.  Kidneys/Urinary tract: No evidence of urolithiasis or hydronephrosis. 3 cm complex cyst with mild mural calcifications in the posterior upper pole the left kidney is stable.  Stomach/Bowel/Peritoneum: Mild increase and mesenteric edema and mild ascites is seen since previous study. Persistent wall thickening is seen involving multiple small bowel loops in the right abdomen. In the setting of superior mesenteric vein thrombosis, this is suspicious for bowel ischemia. Increased dilatation of proximal small bowel is also seen. No evidence of pneumatosis or free air.  Vascular/Lymphatic: No pathologically  enlarged lymph nodes identified. Superior mesenteric vein remains distended, consistent with venous thrombosis as demonstrated on recent MRI.  Reproductive:  No mass or other significant abnormality noted.  Other:  None.  Musculoskeletal:  No suspicious bone lesions identified.  IMPRESSION: Persistent small bowel wall thickening in the right abdomen, suspicious for bowel ischemia in the setting of SMV thrombosis which was demonstrated on recent MRI.  Increased proximal small bowel dilatation, suspicious for small bowel obstruction.  Increase in diffuse mesenteric edema and ascites.  Critical Value/emergent results were called by telephone at the time of interpretation on 08/07/2014 at 2:37 pm to Dr. Louellen Molder , who verbally acknowledged these results.   Electronically Signed   By: Earle Gell M.D.   On: 08/07/2014 14:37   Ct Abdomen Pelvis Wo Contrast  08/04/2014   CLINICAL DATA:  Generalized abdomen pain, cramps, nausea, vomiting.  EXAM: CT ABDOMEN AND PELVIS WITHOUT CONTRAST  TECHNIQUE: Multidetector CT imaging of the abdomen and pelvis was performed following the standard protocol without IV contrast. There is oral contrast in the stomach only.  COMPARISON:  July 06, 2002  FINDINGS: The liver, spleen, pancreas, gallbladder, adrenal glands and right kidney are normal. There is no hydronephrosis bilaterally. There is a 2.3 x 3 cm slight low-density lesion in the posterior midpole left kidney with minimal rim calcification ; on the previous CT, a simple cysts was present in the same location. There is atherosclerosis of the abdominal aorta without aneurysmal dilatation. There is no abdominal lymphadenopathy.  There are several loops of abnormal enlarged thick walled small bowel loops with surrounding inflammation and fluid in the right lower quadrant. There is no free air. The appendix is not definitely seen. The colon is normal.  Fluid-filled bladder is normal. Small amount of free fluid is identified in  the pelvis. The lung bases are clear. Degenerative joint changes of the spine are noted.  IMPRESSION: Several loops of abnormal enlarged thick walled small bowel loops with surrounding inflammation and fluid in the right lower quadrant. The findings are nonspecific but can be due to infectious or inflammatory etiology.   Electronically Signed   By: Abelardo Diesel M.D.   On: 08/04/2014 07:37   Dg Abd 1 View  08/25/2014  CLINICAL DATA:  Abdominal pain for 3 weeks.  EXAM: ABDOMEN - 1 VIEW  COMPARISON:  08/12/2014  FINDINGS: There are a few dilated loops of small bowel within the left upper quadrant of the abdomen which measure up to 3.9 cm. When compared with the previous exam the degree of bowel distention is significantly improved. Hernia mesh overlies the pelvis.  IMPRESSION: 1. A few persistent dilated loops of small bowel in the left upper quadrant.   Electronically Signed   By: Kerby Moors M.D.   On: 08/25/2014 18:25   Mr Abdomen Wo Contrast  08/05/2014   CLINICAL DATA:  Abdominal pain nausea and cramping. eval for mesenteric vein thrombosis. Pt is 300lbs and extremely claus.  EXAM: MRI/ MRA ABDOMEN WITHOUT CONTRAST  TECHNIQUE: Multiplanar, multiecho pulse sequences of the abdomen were obtained WITHOUT intravenous contrast. Angiographic images of abdomen were obtained using MRA technique WITHOUT intravenous contrast.Pre meds were given. Pt unable to stay awake for breath hold sequances. Was attemting to repeat axial ssfse lower in to the pelvis when pt became spastic and crawling out of the scanner. Unable to obtain any further imaging  : COMPARISON:  CT 08/04/2014 AND EARLIER STUDIES  FINDINGS: There is occlusive thrombus through the visualized superior mesenteric vein and main portal vein, terminating at the portal bifurcation. There may be small amount of nonocclusive thrombus extending into the left portal vein. Normal flow signal in the splenic vein.  Limited assessment of the abdominal aorta and  proximal visceral and renal branches grossly unremarkable. IVC is patent.  There is a small amount perihepatic ,perisplenic, right lower quadrant mesenteric, and pelvic ascites. No focal liver lesion identified. 2.6 cm fluid signal partially exophytic probable cyst from the interpolar region left kidney. Right kidney, adrenal glands, pancreas, and nondilated gallbladder unremarkable.  IMPRESSION:  1. Occlusive thrombus in superior mesenteric vein extending through the main portal vein. 2. Abdominal ascites 3. Left renal cyst.   Electronically Signed   By: Arne Cleveland M.D.   On: 08/05/2014 15:34   Mr Jodene Nam Abdomen Wo Contrast  08/05/2014   CLINICAL DATA:  Abdominal pain nausea and cramping. eval for mesenteric vein thrombosis. Pt is 300lbs and extremely claus.  EXAM: MRI/ MRA ABDOMEN WITHOUT CONTRAST  TECHNIQUE: Multiplanar, multiecho pulse sequences of the abdomen were obtained WITHOUT intravenous contrast. Angiographic images of abdomen were obtained using MRA technique WITHOUT intravenous contrast.Pre meds were given. Pt unable to stay awake for breath hold sequances. Was attemting to repeat axial ssfse lower in to the pelvis when pt became spastic and crawling out of the scanner. Unable to obtain any further imaging  : COMPARISON:  CT 08/04/2014 AND EARLIER STUDIES  FINDINGS: There is occlusive thrombus through the visualized superior mesenteric vein and main portal vein, terminating at the portal bifurcation. There may be small amount of nonocclusive thrombus extending into the left portal vein. Normal flow signal in the splenic vein.  Limited assessment of the abdominal aorta and proximal visceral and renal branches grossly unremarkable. IVC is patent.  There is a small amount perihepatic ,perisplenic, right lower quadrant mesenteric, and pelvic ascites. No focal liver lesion identified. 2.6 cm fluid signal partially exophytic probable cyst from the interpolar region left kidney. Right kidney, adrenal  glands, pancreas, and nondilated gallbladder unremarkable.  IMPRESSION:  1. Occlusive thrombus in superior mesenteric vein extending through the main portal vein. 2. Abdominal ascites 3. Left renal cyst.   Electronically Signed   By: Arne Cleveland M.D.   On: 08/05/2014  15:34   Ir Angiogram Selective Each Additional Vessel  08/08/2014   CLINICAL DATA:  Superior mesenteric vein and portal vein DVT with bowel ischemia.  EXAM: IR ULTRASOUND GUIDANCE VASC ACCESS RIGHT; THROMBOECTOMY MECHANICAL VENOUS; IR INFUSION THROMBOL VENOUS INITIAL (MS); PORTAL VENOGRAPHY; ADDITIONAL ARTERIOGRAPHY  FLUOROSCOPY TIME:  8 min and 6 seconds.  MEDICATIONS AND MEDICAL HISTORY: Versed 4 mg, Fentanyl 175 mcg.  As antibiotic prophylaxis, Ancef was ordered pre-procedure and administered intravenously within one hour of incision.  ANESTHESIA/SEDATION: Moderate sedation time: 52 minutes  CONTRAST:  50 cc Omnipaque 300.  Carbon dioxide.  PROCEDURE: The procedure, risks, benefits, and alternatives were explained to the patient. Questions regarding the procedure were encouraged and answered. The patient understands and consents to the procedure.  The right flank was prepped with Betadine in a sterile fashion, and a sterile drape was applied covering the operative field. A sterile gown and sterile gloves were used for the procedure.  1% lidocaine was utilized for local infiltration. Under sonographic guidance, a 22 gauge Chiba needle was inserted into the peripheral aspect of the main right portal vein. Contrast was injected confirming needle position. The needle was removed over a 018 wire. The Accustick dilator was inserted. A small amount of contrast was injected. Carbon dioxide portal venography was performed.  The Accustick dilator was exchanged over the 3 J for a 5 [place] Kumpe the catheter. This was advanced into the lower portal vein and venography was performed. The catheter was advanced into the upper SMV and contrast was injected.  There is partially occlusive thrombus in the main portal vein and occlusive thrombus in the SMV.  The catheter was carefully advanced over a glidewire to the small venous branches of the distal ileum in the right lower quadrant. Contrast was injected demonstrating stasis of flow within the small branches adjacent to the intestines and complete occlusion within the larger more central branches.  The Kumpe the catheter was removed over a Rosen wire. A 6 French sheath was advanced over the wire to the upper main portal vein. The Angiojet was utilized to mechanically lysed the thrombus for 96 seconds.  Subsequently, a 90 cm 20 cm infusion length 5 French multi side-hole catheter was advanced over the Warren wire to the right lower quadrant. The catheter is positioned along side the thrombus extending from the main portal vein to the ileum. Lysis with 0.5 mg tPA was then instituted. Heparin protocol was resumed per peripheral access.  FINDINGS: Portal venography confirms partial occlusion of the main portal vein and complete occlusion of the SMV. Splenic vein is patent.  Subsequent venogram images confirmed occlusion of mesenteric brain branches to the right lower quadrant. Peripheral branches adjacent to the intestine are patent with stasis of flow.  The neck series of images demonstrate placement of a 6 French sheath and multi side hole catheter along side the thrombus, extending from the main portal vein to the small mesenteric venous branches in the right lower quadrant.  COMPLICATIONS: None  IMPRESSION: Successful mechanical and pharmacological lysis of thrombus in the main portal vein, superior mesenteric vein, and venous branches in the right lower quadrant. TPA had 0.5 milligrams/hour will be instituted via multi side-hole catheter along side the thrombus.   Electronically Signed   By: Maryclare Bean M.D.   On: 08/08/2014 17:21   Ir Transhepatic Portogram Wo Hemo  08/08/2014   CLINICAL DATA:  Superior mesenteric vein  and portal vein DVT with bowel ischemia.  EXAM: IR ULTRASOUND GUIDANCE VASC  ACCESS RIGHT; THROMBOECTOMY MECHANICAL VENOUS; IR INFUSION THROMBOL VENOUS INITIAL (MS); PORTAL VENOGRAPHY; ADDITIONAL ARTERIOGRAPHY  FLUOROSCOPY TIME:  8 min and 6 seconds.  MEDICATIONS AND MEDICAL HISTORY: Versed 4 mg, Fentanyl 175 mcg.  As antibiotic prophylaxis, Ancef was ordered pre-procedure and administered intravenously within one hour of incision.  ANESTHESIA/SEDATION: Moderate sedation time: 52 minutes  CONTRAST:  50 cc Omnipaque 300.  Carbon dioxide.  PROCEDURE: The procedure, risks, benefits, and alternatives were explained to the patient. Questions regarding the procedure were encouraged and answered. The patient understands and consents to the procedure.  The right flank was prepped with Betadine in a sterile fashion, and a sterile drape was applied covering the operative field. A sterile gown and sterile gloves were used for the procedure.  1% lidocaine was utilized for local infiltration. Under sonographic guidance, a 22 gauge Chiba needle was inserted into the peripheral aspect of the main right portal vein. Contrast was injected confirming needle position. The needle was removed over a 018 wire. The Accustick dilator was inserted. A small amount of contrast was injected. Carbon dioxide portal venography was performed.  The Accustick dilator was exchanged over the 3 J for a 5 [place] Kumpe the catheter. This was advanced into the lower portal vein and venography was performed. The catheter was advanced into the upper SMV and contrast was injected. There is partially occlusive thrombus in the main portal vein and occlusive thrombus in the SMV.  The catheter was carefully advanced over a glidewire to the small venous branches of the distal ileum in the right lower quadrant. Contrast was injected demonstrating stasis of flow within the small branches adjacent to the intestines and complete occlusion within the larger more  central branches.  The Kumpe the catheter was removed over a Rosen wire. A 6 French sheath was advanced over the wire to the upper main portal vein. The Angiojet was utilized to mechanically lysed the thrombus for 96 seconds.  Subsequently, a 90 cm 20 cm infusion length 5 French multi side-hole catheter was advanced over the [place] wire to the right lower quadrant. The catheter is positioned along side the thrombus extending from the main portal vein to the ileum. Lysis with 0.5 mg tPA was then instituted. Heparin protocol was resumed per peripheral access.  FINDINGS: Portal venography confirms partial occlusion of the main portal vein and complete occlusion of the SMV. Splenic vein is patent.  Subsequent venogram images confirmed occlusion of mesenteric brain branches to the right lower quadrant. Peripheral branches adjacent to the intestine are patent with stasis of flow.  The neck series of images demonstrate placement of a 6 French sheath and multi side hole catheter along side the thrombus, extending from the main portal vein to the small mesenteric venous branches in the right lower quadrant.  COMPLICATIONS: None  IMPRESSION: Successful mechanical and pharmacological lysis of thrombus in the main portal vein, superior mesenteric vein, and venous branches in the right lower quadrant. TPA had 0.5 milligrams/hour will be instituted via multi side-hole catheter along side the thrombus.   Electronically Signed   By: Maryclare Bean M.D.   On: 08/08/2014 17:21   US Renal Port  08/05/2014   CLINICAL DATA:  Acute renal injury.  Hypertension.  EXAM: RENAL/URINARY TRACT ULTRASOUND COMPLETE  COMPARISON:  08/04/2014  FINDINGS: Right Kidney:  Length: 13.0 cm. Echogenicity within normal limits. No mass or hydronephrosis visualized.  Left Kidney:  Length: 12.3 cm. Echogenicity within normal limits. No hydronephrosis. 2.2 by 1.7 cm complex exophytic  lesion of the left mid kidney, as shown on CT scan.  Bladder:  Appears normal for  degree of bladder distention.  Other: Small amount of ascites adjacent to the right hepatic lobe. 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  IMPRESSION: 1. 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 This could be confirmed with noncontrast 3D time-of-flight MRI of the mesenteric vasculature, if clinically warranted. 2. Small amount of ascites adjacent to the right hepatic lobe. 3. The 2.2 by 1.7 cm exophytic lesion of the left mid kidney is confirmed to be complex by ultrasound. I do note that there was a reasonably benign appearing cyst in this location on 07/06/2002. Although likely benign, the lesion is not completely specific on today's exam. These results were called by telephone at the time of interpretation on 08/05/2014 at 12:42 pm to Dr. Louellen Molder , who verbally acknowledged these results.   Electronically Signed   By: Sherryl Barters M.D.   On: 08/05/2014 12:46   Ir Pta Venous Right  08/11/2014   CLINICAL DATA:  Forty-eight hr portal vein and superior mesenteric vein lysis check.  EXAM: IR THROMB F/U EVAL ART/VEN FINAL DAY; THROMBOECTOMY MECHANICAL VENOUS; PTA VENOUS; IR EMBO VENOUS NOT HEMORR HEMANG INC GUIDE ROADMAPPING  FLUOROSCOPY TIME:  Sixteen minutes  MEDICATIONS AND MEDICAL HISTORY: Versed two mg, Fentanyl 100 mcg.  Additional Medications: None.  ANESTHESIA/SEDATION: Moderate sedation time: 60 minutes  CONTRAST:  50 cc Omnipaque 300  PROCEDURE: The procedure, risks, benefits, and alternatives were explained to the patient. Questions regarding the procedure were encouraged and answered. The patient understands and consents to the procedure.  The right flank was prepped with Betadine in a sterile fashion, and a sterile drape was applied covering the operative field. A sterile gown  and sterile gloves were used for the procedure.  Initially, an NG tube was placed. The tip was positioned in the body of the stomach. This resulted in significant decompression.  The inner wire of the infusion catheter was removed and contrast was injected for venography.  The catheter was retracted over a Rosen wire and the Angiojet was utilized through the persistent thrombus for 156 seconds.  Repeat venography through a Kumpe catheter was performed.  The Rosen wire was reinserted and a 5 mm balloon was utilized to dilate the thrombus within the superior mesenteric and portal veins.  Repeat venography demonstrates improvement.  A pigtail catheter was advanced over the wire to the main portal vein an utilizing twirl technique, additional mechanical thrombectomy was performed. Final venography was performed.  A copy catheter was advanced through the sheath and into the main portal vein. The catheter was retracted into the transhepatic tract. The tract was embolized with Gel-Foam slurry and several the 038 embolization coils. Two 4 mm coils and four 6 mm coils were utilized.  FINDINGS: Initial venography demonstrates improvement overall. The most significant finding was innumerable collateral venous structures now opacifying from the peripheral aspect of the superior mesenteric venous system. 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  After performing the Angiojet, angioplasty, and pigtail technique, there was again noted to be significant improvement after each subsequent intervention. There is some partial thrombus in the most inferior portal vein, but most of the portal venous  thrombus had resolved. There is chronic thrombus along the superior mesenteric vein of but there is no a large channel through the thrombus.  COMPLICATIONS: None  IMPRESSION: Successful lysed cysts and thrombectomy of thrombus throughout the portal vein and  superior mesenteric vein. At the conclusion of the treatment, there are now multiple collateral venous structures within the periphery of the superior mesenteric venous system that are opacifying an allowing for decompression. There is chronic thrombus within the superior mesenteric vein, but there is now a channel with re- cannulization. In addition, most of the thrombus in the portal vein has resolved. The patient will be placed on anti coagulation in 3 hr.   Electronically Signed   By: Maryclare Bean M.D.   On: 08/11/2014 09:53   Ir Thrombect Veno Mech Mod Sed  08/11/2014   CLINICAL DATA:  Forty-eight hr portal vein and superior mesenteric vein lysis check.  EXAM: IR THROMB F/U EVAL ART/VEN FINAL DAY; THROMBOECTOMY MECHANICAL VENOUS; PTA VENOUS; IR EMBO VENOUS NOT HEMORR HEMANG INC GUIDE ROADMAPPING  FLUOROSCOPY TIME:  Sixteen minutes  MEDICATIONS AND MEDICAL HISTORY: Versed two mg, Fentanyl 100 mcg.  Additional Medications: None.  ANESTHESIA/SEDATION: Moderate sedation time: 60 minutes  CONTRAST:  50 cc Omnipaque 300  PROCEDURE: The procedure, risks, benefits, and alternatives were explained to the patient. Questions regarding the procedure were encouraged and answered. The patient understands and consents to the procedure.  The right flank was prepped with Betadine in a sterile fashion, and a sterile drape was applied covering the operative field. A sterile gown and sterile gloves were used for the procedure.  Initially, an NG tube was placed. The tip was positioned in the body of the stomach. This resulted in significant decompression.  The inner wire of the infusion catheter was removed and contrast was injected for venography.  The catheter was retracted over a Rosen wire and the Angiojet was utilized through the persistent thrombus for 156 seconds.  Repeat venography through a Kumpe catheter was performed.  The Rosen wire was reinserted and a 5 mm balloon was utilized to dilate the thrombus within the superior  mesenteric and portal veins.  Repeat venography demonstrates improvement.  A pigtail catheter was advanced over the wire to the main portal vein an utilizing twirl technique, additional mechanical thrombectomy was performed. Final venography was performed.  A copy catheter was advanced through the sheath and into the main portal vein. The catheter was retracted into the transhepatic tract. The tract was embolized with Gel-Foam slurry and several the 038 embolization coils. Two 4 mm coils and four 6 mm coils were utilized.  FINDINGS: Initial venography demonstrates improvement overall. The most significant finding was innumerable collateral venous structures now opacifying from the peripheral aspect of the superior mesenteric venous system. 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  After performing the Angiojet, angioplasty, and pigtail technique, there was again noted to be significant improvement after each subsequent intervention. There is some partial thrombus in the most inferior portal vein, but most of the portal venous thrombus had resolved. There is chronic thrombus along the superior mesenteric vein of but there is no a large channel through the thrombus.  COMPLICATIONS: None  IMPRESSION: Successful lysed cysts and thrombectomy of thrombus throughout the portal vein and superior mesenteric vein. At the conclusion of the treatment, there are now multiple collateral venous structures within the periphery of the superior mesenteric venous system that are opacifying an  allowing for decompression. There is chronic thrombus within the superior mesenteric vein, but there is now a channel with re- cannulization. In addition, most of the thrombus in the portal vein has resolved. The patient will be placed on anti coagulation in 3 hr.   Electronically Signed   By: Maryclare Bean M.D.   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  EXAM: IR ULTRASOUND GUIDANCE VASC ACCESS RIGHT; THROMBOECTOMY MECHANICAL VENOUS; IR INFUSION THROMBOL VENOUS INITIAL (MS); PORTAL VENOGRAPHY; ADDITIONAL ARTERIOGRAPHY  FLUOROSCOPY TIME:  8 min and 6 seconds.  MEDICATIONS AND MEDICAL HISTORY: Versed 4 mg, Fentanyl 175 mcg.  As antibiotic prophylaxis, Ancef was ordered pre-procedure and administered intravenously within one hour of incision.  ANESTHESIA/SEDATION: Moderate sedation time: 52 minutes  CONTRAST:  50 cc Omnipaque 300.  Carbon dioxide.  PROCEDURE: The procedure, risks, benefits, and alternatives were explained to the patient. Questions regarding the procedure were encouraged and answered. The patient understands and consents to the procedure.  The right flank was prepped with Betadine in a sterile fashion, and a sterile drape was applied covering the operative field. A sterile gown and sterile gloves were used for the procedure.  1% lidocaine was utilized for local infiltration. Under sonographic guidance, a 22 gauge Chiba needle was inserted into the peripheral aspect of the main right portal vein. Contrast was injected confirming needle position. The needle was removed over a 018 wire. The Accustick dilator was inserted. A small amount of contrast was injected. Carbon dioxide portal venography was performed.  The Accustick dilator was exchanged over the 3 J for a 5 [place] Kumpe the catheter. This was advanced into the lower portal vein and venography was performed. The catheter was advanced into the upper SMV and contrast was injected. There is partially occlusive thrombus in the main portal vein and occlusive thrombus in the SMV.  The catheter was carefully advanced over a glidewire to the small venous branches of the distal ileum in the right lower quadrant. Contrast was injected demonstrating stasis of flow within the small branches adjacent to the  intestines and complete occlusion within the larger more central branches.  The Kumpe the catheter was removed over a Rosen wire. A 6 French sheath was advanced over the wire to the upper main portal vein. The Angiojet was utilized to mechanically lysed the thrombus for 96 seconds.  Subsequently, a 90 cm 20 cm infusion length 5 French multi side-hole catheter was advanced over the [place] wire to the right lower quadrant. The catheter is positioned along side the thrombus extending from the main portal vein to the ileum. Lysis with 0.5 mg tPA was then instituted. Heparin protocol was resumed per peripheral access.  FINDINGS: Portal venography confirms partial occlusion of the main portal vein and complete occlusion of the SMV. Splenic vein is patent.  Subsequent venogram images confirmed occlusion of mesenteric brain branches to the right lower quadrant. Peripheral branches adjacent to the intestine are patent with stasis of flow.  The neck series of images demonstrate placement of a 6 French sheath and multi side hole catheter along side the thrombus, extending from the main portal vein to the small mesenteric venous branches in the right lower quadrant.  COMPLICATIONS: None  IMPRESSION: Successful mechanical and pharmacological lysis of thrombus in the main portal vein, superior mesenteric vein, and venous branches in the right lower quadrant. TPA had  0.5 milligrams/hour will be instituted via multi side-hole catheter along side the thrombus.   Electronically Signed   By: Maryclare Bean M.D.   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  EXAM: IR ULTRASOUND GUIDANCE VASC ACCESS RIGHT; THROMBOECTOMY MECHANICAL VENOUS; IR INFUSION THROMBOL VENOUS INITIAL (MS); PORTAL VENOGRAPHY; ADDITIONAL ARTERIOGRAPHY  FLUOROSCOPY TIME:  8 min and 6 seconds.  MEDICATIONS AND MEDICAL HISTORY: Versed 4 mg, Fentanyl 175 mcg.  As  antibiotic prophylaxis, Ancef was ordered pre-procedure and administered intravenously within one hour of incision.  ANESTHESIA/SEDATION: Moderate sedation time: 52 minutes  CONTRAST:  50 cc Omnipaque 300.  Carbon dioxide.  PROCEDURE: The procedure, risks, benefits, and alternatives were explained to the patient. Questions regarding the procedure were encouraged and answered. The patient understands and consents to the procedure.  The right flank was prepped with Betadine in a sterile fashion, and a sterile drape was applied covering the operative field. A sterile gown and sterile gloves were used for the procedure.  1% lidocaine was utilized for local infiltration. Under sonographic guidance, a 22 gauge Chiba needle was inserted into the peripheral aspect of the main right portal vein. Contrast was injected confirming needle position. The needle was removed over a 018 wire. The Accustick dilator was inserted. A small amount of contrast was injected. Carbon dioxide portal venography was performed.  The Accustick dilator was exchanged over the 3 J for a 5 [place] Kumpe the catheter. This was advanced into the lower portal vein and venography was performed. The catheter was advanced into the upper SMV and contrast was injected. There is partially occlusive thrombus in the main portal vein and occlusive thrombus in the SMV.  The catheter was carefully advanced over a glidewire to the small venous branches of the distal ileum in the right lower quadrant. Contrast was injected demonstrating stasis of flow within the small branches adjacent to the intestines and complete occlusion within the larger more central branches.  The Kumpe the catheter was removed over a Rosen wire. A 6 French sheath was advanced over the wire to the upper main portal vein. The Angiojet was utilized to mechanically lysed the thrombus for 96 seconds.  Subsequently, a 90 cm 20 cm infusion length 5 French multi side-hole catheter was advanced over  the [place] wire to the right lower quadrant. The catheter is positioned along side the thrombus extending from the main portal vein to the ileum. Lysis with 0.5 mg tPA was then instituted. Heparin protocol was resumed per peripheral access.  FINDINGS: Portal venography confirms partial occlusion of the main portal vein and complete occlusion of the SMV. Splenic vein is patent.  Subsequent venogram images confirmed occlusion of mesenteric brain branches to the right lower quadrant. Peripheral branches adjacent to the intestine are patent with stasis of flow.  The neck series of images demonstrate placement of a 6 French sheath and multi side hole catheter along side the thrombus, extending from the main portal vein to the small mesenteric venous branches in the right lower quadrant.  COMPLICATIONS: None  IMPRESSION: Successful mechanical and pharmacological lysis of thrombus in the main portal vein, superior mesenteric vein, and venous branches in the right lower quadrant. TPA had 0.5 milligrams/hour will be instituted via multi side-hole catheter along side the thrombus.   Electronically Signed   By: Maryclare Bean M.D.   On: 08/08/2014 17:21  Dg Chest Port 1 View  08/11/2014   CLINICAL DATA:  Hypoxia.  EXAM: PORTABLE CHEST - 1 VIEW  COMPARISON:  August 10, 2014.  FINDINGS: Stable cardiomediastinal silhouette. No pneumothorax or significant pleural effusion is noted. Nasogastric tube tip is seen in expected position of proximal stomach. Left lung is clear. Right lower lobe opacity noted on prior exam is slightly improved, although significant residual density remains concerning for atelectasis or pneumonia. Bony thorax appears intact.  IMPRESSION: Nasogastric tube tip seen in proximal stomach.  Improved right basilar opacity is noted, although significant residual density remains consistent with pneumonia or subsegmental atelectasis. Continued radiographic follow-up is recommended until resolution.   Electronically  Signed   By: Sabino Dick M.D.   On: 08/11/2014 08:30   Dg Chest Port 1 View  08/10/2014   CLINICAL DATA:  49 year old male with increasing shortness of breath  EXAM: PORTABLE CHEST - 1 VIEW  COMPARISON:  Prior chest x-ray 04/17/2013  FINDINGS: Low inspiratory volumes. Nonspecific right mid and lower lung airspace opacity. Suspect a small layering effusion. No pulmonary edema. Cardiac and mediastinal contours are within normal limits. No pneumothorax. No acute osseous abnormality.  IMPRESSION: Low inspiratory volumes with new right mid and lower lung opacities. Favored to reflect a small right layering pleural effusion with associated atelectasis. Right lower lobe infiltrate, or mild alveolar hemorrhage are also within the differential.   Electronically Signed   By: Jacqulynn Cadet M.D.   On: 08/10/2014 10:02   Dg Abd 2 Views  08/12/2014   CLINICAL DATA:  Adynamic ileus. Post catheter directed thrombolysis of the SMV and portal vein.  EXAM: ABDOMEN - 2 VIEW  COMPARISON:  08/11/2014 ; 08/10/2014; CT abdomen pelvis - 08/07/2014; abdominal MRI - 08/05/2014; catheter directed thrombolysis - 08/10/2014  FINDINGS: Enteric contrast is now seen extending throughout the colon to the level of the rectum. There is persistent marked gaseous distension of multiple upstream loops of small bowel with index loop within the left mid hemiabdomen measuring approximately 6.2 cm in maximal diameter. No definite pneumoperitoneum, pneumatosis or portal venous gas.  Enteric tube tip and side port projects over the gastric fundus.  Limited visualization lower thorax demonstrates a suspected small right-sided pleural effusion with associated right basilar heterogeneous/consolidative opacities.  Embolization coils overlie the peripheral / lateral aspect of the right lobe of the liver. No definite abnormal intra-abdominal calcifications.  Post bilateral hernia repair.  No acute osseus abnormalities.  IMPRESSION: 1. Findings suggestive of  persistent though potentially minimally improved ileus with interval transit of ingested contrast, now seen extending to the level of the rectum. Given marked distention of multiple loops of small bowel, continued attention on follow-up is recommended. 2. Suspected small right-sided effusion with associated right basilar opacities, possibly atelectasis.   Electronically Signed   By: Sandi Mariscal M.D.   On: 08/12/2014 08:31   Dg Abd Acute W/chest  08/04/2014   CLINICAL DATA:  Abdominal pain  EXAM: ACUTE ABDOMEN SERIES (ABDOMEN 2 VIEW & CHEST 1 VIEW)  COMPARISON:  04/17/2013 chest x-ray  FINDINGS: The abdomen is essentially gasless, which limits the utility of radiography. Few bubbles noted in the right lower quadrant. There is no concerning intra-abdominal mass effect or calcification. No pneumoperitoneum. Previous lower abdominal wall mesh repair. Normal heart size and mediastinal contours. No acute infiltrate or edema. No effusion or pneumothorax. No acute osseous findings.  IMPRESSION: 1. There is no definitive obstruction, but this technique is limited by relatively gasless abdomen. Fluid dilated loops  of bowel would be missed on this study. 2. Negative chest.   Electronically Signed   By: Jorje Guild M.D.   On: 08/04/2014 06:29   Dg Abd Portable 1v  08/27/2014   CLINICAL DATA:  Small bowel ischemia  EXAM: PORTABLE ABDOMEN - 1 VIEW  COMPARISON:  08/26/2014  FINDINGS: There has been progression of enteric contrast material from the small bowel into the colon and up to the rectum. A few air-filled loops of small bowel are identified within the left upper quadrant and right lower quadrant of the abdomen. No significant small bowel dilatation noted.  IMPRESSION: 1. Progression of enteric contrast material up to the level of the rectum.   Electronically Signed   By: Kerby Moors M.D.   On: 08/27/2014 07:40   Dg Abd Portable 1v  08/11/2014   CLINICAL DATA:  Small bowel obstruction.  EXAM: PORTABLE ABDOMEN -  1 VIEW  COMPARISON:  August 10, 2014.  FINDINGS: There remains multiple dilated loops of small bowel concerning for distal small bowel obstruction. These are stable compared to prior exam. No significant colonic dilatation is noted. No renal calculi are noted.  IMPRESSION: Stable dilated small bowel loops are noted consistent with distal small bowel obstruction. Continued radiographic follow-up is recommended.   Electronically Signed   By: Sabino Dick M.D.   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 There remains some contrast within the proximal colon stable from the prior study. Postsurgical changes are noted. An infusion catheter is seen within the portal vein and superior mesenteric vein. No free air is seen.  IMPRESSION: Increasing small bowel dilatation when compared with the prior exam.   Electronically Signed   By: Inez Catalina M.D.   On: 08/10/2014 10:01   Dg Abd Portable 1v  08/05/2014   CLINICAL DATA:  Subsequent evaluation for right-sided abdominal pain with nausea vomiting and diarrhea  EXAM: PORTABLE ABDOMEN - 1 VIEW  COMPARISON:  08/04/2014  FINDINGS: Mild gaseous distention of the stomach. Minimal gas elsewhere in the abdomen. There is a small amount of oral contrast appreciated throughout the colon.  IMPRESSION: Nonspecific gas pattern.   Electronically Signed   By: Skipper Cliche M.D.   On: 08/05/2014 11:21   Ir Embo Venous Not [place] [address]  08/11/2014   CLINICAL DATA:  Forty-eight hr portal vein and superior mesenteric vein lysis check.  EXAM: IR THROMB F/U EVAL ART/VEN FINAL DAY; THROMBOECTOMY MECHANICAL VENOUS; PTA VENOUS; IR EMBO VENOUS NOT HEMORR HEMANG INC GUIDE ROADMAPPING   FLUOROSCOPY TIME:  Sixteen minutes  MEDICATIONS AND MEDICAL HISTORY: Versed two mg, Fentanyl 100 mcg.  Additional Medications: None.  ANESTHESIA/SEDATION: Moderate sedation time: 60 minutes  CONTRAST:  50 cc Omnipaque 300  PROCEDURE: The procedure, risks, benefits, and alternatives were explained to the patient. Questions regarding the procedure were encouraged and answered. The patient understands and consents to the procedure.  The right flank was prepped with Betadine in a sterile fashion, and a sterile drape was applied covering the operative field. A sterile gown and sterile gloves were used for the procedure.  Initially, an NG tube was placed. The tip was positioned in the body of the stomach.  This resulted in significant decompression.  The inner wire of the infusion catheter was removed and contrast was injected for venography.  The catheter was retracted over a Rosen wire and the Angiojet was utilized through the persistent thrombus for 156 seconds.  Repeat venography through a Kumpe catheter was performed.  The Rosen wire was reinserted and a 5 mm balloon was utilized to dilate the thrombus within the superior mesenteric and portal veins.  Repeat venography demonstrates improvement.  A pigtail catheter was advanced over the wire to the main portal vein an utilizing twirl technique, additional mechanical thrombectomy was performed. Final venography was performed.  A copy catheter was advanced through the sheath and into the main portal vein. The catheter was retracted into the transhepatic tract. The tract was embolized with Gel-Foam slurry and several the 038 embolization coils. Two 4 mm coils and four 6 mm coils were utilized.  FINDINGS: Initial venography demonstrates improvement overall. The most significant finding was innumerable collateral venous structures now opacifying from the peripheral aspect of the superior mesenteric venous system. There is persistent chronic thrombus within the superior  mesenteric vein and some persistent thrombus in the portal vein.  After performing the Angiojet, angioplasty, and pigtail technique, there was again noted to be significant improvement after each subsequent intervention. There is some partial thrombus in the most inferior portal vein, but most of the portal venous thrombus had resolved. There is chronic thrombus along the superior mesenteric vein of but there is no a large channel through the thrombus.  COMPLICATIONS: None  IMPRESSION: Successful lysed cysts and thrombectomy of thrombus throughout the portal vein and superior mesenteric vein. At the conclusion of the treatment, there are now multiple collateral venous structures within the periphery of the superior mesenteric venous system that are opacifying an allowing for decompression. There is chronic thrombus within the superior mesenteric vein, but there is now a channel with re- cannulization. In addition, most of the thrombus in the portal vein has resolved. The patient will be placed on anti coagulation in 3 hr.   Electronically Signed   By: Maryclare Bean M.D.   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  EXAM: IR ULTRASOUND GUIDANCE VASC ACCESS RIGHT; THROMBOECTOMY MECHANICAL VENOUS; IR INFUSION THROMBOL VENOUS INITIAL (MS); PORTAL VENOGRAPHY; ADDITIONAL ARTERIOGRAPHY  FLUOROSCOPY TIME:  8 min and 6 seconds.  MEDICATIONS AND MEDICAL HISTORY: Versed 4 mg, Fentanyl 175 mcg.  As antibiotic prophylaxis, Ancef was ordered pre-procedure and administered intravenously within one hour of incision.  ANESTHESIA/SEDATION: Moderate sedation time: 52 minutes  CONTRAST:  50 cc Omnipaque 300.  Carbon dioxide.  PROCEDURE: The procedure, risks, benefits, and alternatives were explained to the patient. Questions regarding the procedure were encouraged and answered. The patient understands and consents to the  procedure.  The right flank was prepped with Betadine in a sterile fashion, and a sterile drape was applied covering the operative field. A sterile gown and sterile gloves were used for the procedure.  1% lidocaine was utilized for local infiltration. Under sonographic guidance, a 22 gauge Chiba needle was inserted into the peripheral aspect of the main right portal vein. Contrast was injected confirming needle position. The needle was removed over a 018 wire. The Accustick dilator was inserted. A small amount of contrast was injected. Carbon dioxide portal venography was performed.  The Accustick dilator was exchanged over the 3 J  for a 5 [place] Kumpe the catheter. This was advanced into the lower portal vein and venography was performed. The catheter was advanced into the upper SMV and contrast was injected. There is partially occlusive thrombus in the main portal vein and occlusive thrombus in the SMV.  The catheter was carefully advanced over a glidewire to the small venous branches of the distal ileum in the right lower quadrant. Contrast was injected demonstrating stasis of flow within the small branches adjacent to the intestines and complete occlusion within the larger more central branches.  The Kumpe the catheter was removed over a Rosen wire. A 6 French sheath was advanced over the wire to the upper main portal vein. The Angiojet was utilized to mechanically lysed the thrombus for 96 seconds.  Subsequently, a 90 cm 20 cm infusion length 5 French multi side-hole catheter was advanced over the Julian wire to the right lower quadrant. The catheter is positioned along side the thrombus extending from the main portal vein to the ileum. Lysis with 0.5 mg tPA was then instituted. Heparin protocol was resumed per peripheral access.  FINDINGS: Portal venography confirms partial occlusion of the main portal vein and complete occlusion of the SMV. Splenic vein is patent.  Subsequent venogram images confirmed  occlusion of mesenteric brain branches to the right lower quadrant. Peripheral branches adjacent to the intestine are patent with stasis of flow.  The neck series of images demonstrate placement of a 6 French sheath and multi side hole catheter along side the thrombus, extending from the main portal vein to the small mesenteric venous branches in the right lower quadrant.  COMPLICATIONS: None  IMPRESSION: Successful mechanical and pharmacological lysis of thrombus in the main portal vein, superior mesenteric vein, and venous branches in the right lower quadrant. TPA had 0.5 milligrams/hour will be instituted via multi side-hole catheter along side the thrombus.   Electronically Signed   By: Maryclare Bean M.D.   On: 08/08/2014 17:21   Ir Jacolyn Reedy F/u Eval Art/ven Alwyn Ren Day (ms)  08/09/2014   CLINICAL DATA:  49 year old with SMV and portal vein thrombus and concern for bowel ischemia. Catheter-directed thrombolytic therapy was started on 08/08/2014. Patient returns for follow-up angiography.  EXAM: THROMBOLYTIC FOLLOW-UP ANGIOGRAPHY  Physician: Stephan Minister. Henn, MD  FLUOROSCOPY TIME:  6 min and 18 seconds, 1090 mGy  MEDICATIONS: 2 mg Versed, 100 mcg fentanyl. A radiology nurse monitored the patient for moderate sedation.  ANESTHESIA/SEDATION: Moderate sedation time: 60 min  PROCEDURE: Patient was placed supine. The existing catheters in the right lateral abdomen were prepped and draped in sterile fashion. Maximal barrier sterile technique was utilized including caps, mask, sterile gowns, sterile gloves, sterile drape, hand hygiene and skin antiseptic. Contrast was initially injected through the infusion wire port. Subsequently, the infusion wire was removed and contrast was injected through the infusion catheter. These initial angiographic images were limited. As a result, the infusion catheter was removed for a Kumpe catheter over a Bentson wire. Additional angiography was performed. A new 135 cm infusion catheter with 20  cm of infusion length was advanced over a Rosen wire. Tip was placed in the right lower quadrant and similar placed from the previous study. Catheters were secured to the patient. IV heparin and catheter-directed tPA was again started. TPA was started at 0.5 mg/hour.  FINDINGS: There is hepatopetal flow in the SMV but there continues to be a large amount of thrombus throughout the dilated SMV. There is flow within the main portal vein and  the proximal left and right portal veins. Suspect nonocclusive clot in the main portal vein.  Multiple dilated loops of small bowel throughout the abdomen  COMPLICATIONS: None  IMPRESSION: There is flow in the superior mesenteric vein but there continues to be a large amount of irregular thrombus. As a result, a new infusion catheter was placed. Will continue catheter-directed thrombolytic therapy overnight with IV heparin. Plan to have the patient return to Interventional Radiology on 08/10/2014 for follow-up angiography and possibly mechanical thrombectomy.  Patient continues to have multiple dilated loops of small bowel throughout the abdomen. The patient's symptoms are improving with catheter-directed thrombolytic therapy but the radiographic appearance of the abdomen is still concerning for a bowel obstruction.   Electronically Signed   By: Markus Daft M.D.   On: 08/09/2014 18:16   Ir Jacolyn Reedy F/u Eval Art/ven Final Day (ms)  08/11/2014   CLINICAL DATA:  Forty-eight hr portal vein and superior mesenteric vein lysis check.  EXAM: IR THROMB F/U EVAL ART/VEN FINAL DAY; THROMBOECTOMY MECHANICAL VENOUS; PTA VENOUS; IR EMBO VENOUS NOT HEMORR HEMANG INC GUIDE ROADMAPPING  FLUOROSCOPY TIME:  Sixteen minutes  MEDICATIONS AND MEDICAL HISTORY: Versed two mg, Fentanyl 100 mcg.  Additional Medications: None.  ANESTHESIA/SEDATION: Moderate sedation time: 60 minutes  CONTRAST:  50 cc Omnipaque 300  PROCEDURE: The procedure, risks, benefits, and alternatives were explained to the patient.  Questions regarding the procedure were encouraged and answered. The patient understands and consents to the procedure.  The right flank was prepped with Betadine in a sterile fashion, and a sterile drape was applied covering the operative field. A sterile gown and sterile gloves were used for the procedure.  Initially, an NG tube was placed. The tip was positioned in the body of the stomach. This resulted in significant decompression.  The inner wire of the infusion catheter was removed and contrast was injected for venography.  The catheter was retracted over a Rosen wire and the Angiojet was utilized through the persistent thrombus for 156 seconds.  Repeat venography through a Kumpe catheter was performed.  The Rosen wire was reinserted and a 5 mm balloon was utilized to dilate the thrombus within the superior mesenteric and portal veins.  Repeat venography demonstrates improvement.  A pigtail catheter was advanced over the wire to the main portal vein an utilizing twirl technique, additional mechanical thrombectomy was performed. Final venography was performed.  A copy catheter was advanced through the sheath and into the main portal vein. The catheter was retracted into the transhepatic tract. The tract was embolized with Gel-Foam slurry and several the 038 embolization coils. Two 4 mm coils and four 6 mm coils were utilized.  FINDINGS: Initial venography demonstrates improvement overall. The most significant finding was innumerable collateral venous structures now opacifying from the peripheral aspect of the superior mesenteric venous system. 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  After performing the Angiojet, angioplasty, and pigtail technique, there was again noted to be significant improvement after each subsequent intervention. There is some partial thrombus in the most inferior portal vein, but most of the portal venous  thrombus had resolved. There is chronic thrombus along the superior mesenteric vein of but there is no a large channel through the thrombus.  COMPLICATIONS: None  IMPRESSION: Successful lysed cysts and thrombectomy of thrombus throughout the portal vein and superior mesenteric vein. At the conclusion of the treatment, there are now multiple collateral venous structures within the  periphery of the superior mesenteric venous system that are opacifying an allowing for decompression. There is chronic thrombus within the superior mesenteric vein, but there is now a channel with re- cannulization. In addition, most of the thrombus in the portal vein has resolved. The patient will be placed on anti coagulation in 3 hr.   Electronically Signed   By: Maryclare Bean M.D.   On: 08/11/2014 09:53    Microbiology: Recent Results (from the past 240 hour(s))  Culture, blood (routine x 2)     Status: None   Collection Time: 08/25/14  5:47 PM  Result Value Ref Range Status   Specimen Description BLOOD ARM LEFT  Final   Special Requests BOTTLES DRAWN AEROBIC AND ANAEROBIC 10CC  Final   Culture   Final    NO GROWTH 5 DAYS Performed at [facility]    Report Status 09/01/2014 FINAL  Final  Culture, blood (routine x 2)     Status: None   Collection Time: 08/25/14  5:58 PM  Result Value Ref Range Status   Specimen Description BLOOD ARM RIGHT  Final   Special Requests   Final    BOTTLES DRAWN AEROBIC AND ANAEROBIC BLUE 10CC RED 5CC   Culture   Final    NO GROWTH 5 DAYS Performed at [facility]    Report Status 09/01/2014 FINAL  Final  Culture, blood (routine x 2)     Status: None (Preliminary result)   Collection Time: 08/26/14 10:00 PM  Result Value Ref Range Status   Specimen Description BLOOD LEFT HAND  Final   Special Requests BOTTLES DRAWN AEROBIC AND ANAEROBIC 10CC EA  Final   Culture   Final           BLOOD CULTURE RECEIVED NO GROWTH TO DATE CULTURE WILL BE HELD FOR 5 DAYS BEFORE ISSUING A  FINAL NEGATIVE REPORT Performed at [facility]    Report Status PENDING  Incomplete  Culture, blood (routine x 2)     Status: None (Preliminary result)   Collection Time: 08/26/14 10:00 PM  Result Value Ref Range Status   Specimen Description BLOOD RIGHT ANTECUBITAL  Final   Special Requests BOTTLES DRAWN AEROBIC AND ANAEROBIC 10CC EA  Final   Culture   Final           BLOOD CULTURE RECEIVED NO GROWTH TO DATE CULTURE WILL BE HELD FOR 5 DAYS BEFORE ISSUING A FINAL NEGATIVE REPORT Performed at [facility]    Report Status PENDING  Incomplete  MRSA PCR Screening     Status: None   Collection Time: 08/26/14 10:20 PM  Result Value Ref Range Status   MRSA by PCR NEGATIVE NEGATIVE Final    Comment:        The GeneXpert MRSA Assay (FDA approved for NASAL specimens only), is one component of a comprehensive MRSA colonization surveillance program. It is not intended to diagnose MRSA infection nor to guide or monitor treatment for MRSA infections.   Clostridium Difficile by PCR     Status: Abnormal   Collection Time: 08/27/14  7:59 AM  Result Value Ref Range Status   C difficile by pcr POSITIVE (A) NEGATIVE Final    Comment: CRITICAL RESULT CALLED TO, READ BACK BY AND VERIFIED WITH: B.RONCALLO RN [id number] 08/27/14 E.GADDY      Labs: Basic Metabolic Panel:  Recent Labs Lab 08/27/14 0325 08/29/14 0940 08/30/14 0308 08/31/14 0430 09/01/14 0425  NA 135 138 137 138 140  K  3.8 4.7 4.7 3.8 3.7  CL 97 104 102 103 106  CO2 28 30 28 27 28   GLUCOSE 111* 114* 102* 87 98  BUN 18 10 9 10 9   CREATININE 1.42* 1.30 1.14 1.03 0.92  CALCIUM 8.0* 8.0* 7.9* 7.9* 8.1*   Liver Function Tests:  Recent Labs Lab 08/26/14 1248 08/27/14 0325  AST 26 19  ALT 28 21  ALKPHOS 70 59  BILITOT 1.2 1.1  PROT 6.5 5.2*  ALBUMIN 3.0* 2.3*    Recent Labs Lab 08/26/14 1248 08/30/14 0308  LIPASE 70* 38   No results for input(s): AMMONIA in the last 168 hours. CBC:  Recent  Labs Lab 08/26/14 1248  08/29/14 0540 08/30/14 0308 08/31/14 0430 09/01/14 0425 09/02/14 0400  WBC 9.3  < > 6.6 8.4 6.3 4.8 5.8  NEUTROABS 5.7  --   --   --   --   --   --   HGB 11.7*  < > 11.3* 10.2* 9.3* 9.4* 9.4*  HCT 35.6*  < > 34.9* 32.1* 29.6* 30.0* 29.6*  MCV 87.3  < > 90.2 89.2 90.8 88.5 89.4  PLT 277  < > 312 318 367 386 423*  < > = values in this interval not displayed. Cardiac Enzymes:  Recent Labs Lab 08/26/14 1353  TROPONINI <0.03   BNP: BNP (last 3 results) No results for input(s): PROBNP in the last 8760 hours. CBG: No results for input(s): GLUCAP in the last 168 hours.     SignedCristal Ford  [facility] 09/02/2014, 9:29 AM

## 2014-09-02 NOTE — Progress Notes (Signed)
5 Days Post-Op  Subjective: Tolerated fulls Having BM's No fevers Minimal abdominal pain   Objective: Vital signs in last 24 hours: Temp:  [97.9 F (36.6 C)-98.7 F (37.1 C)] 98.7 F (37.1 C) (01/30 0627) Pulse Rate:  [83-92] 87 (01/30 0627) Resp:  [16-19] 16 (01/30 0627) BP: (126-158)/(77-92) 129/77 mmHg (01/30 0627) SpO2:  [90 %-97 %] 97 % (01/30 0627) Weight:  [293 lb 6.4 oz (133.085 kg)] 293 lb 6.4 oz (133.085 kg) (01/29 1339) Last BM Date: 08/27/14  Intake/Output from previous day: 01/29 0701 - 01/30 0700 In: 690 [P.O.:240; IV Piggyback:450] Out: -  Intake/Output this shift:    Abdomen soft, obese, minimally tender Incision draining some bloody drainage, no erythema or signs of wound infection  Lab Results:   Recent Labs  09/01/14 0425 09/02/14 0400  WBC 4.8 5.8  HGB 9.4* 9.4*  HCT 30.0* 29.6*  PLT 386 423*   BMET  Recent Labs  08/31/14 0430 09/01/14 0425  NA 138 140  K 3.8 3.7  CL 103 106  CO2 27 28  GLUCOSE 87 98  BUN 10 9  CREATININE 1.03 0.92  CALCIUM 7.9* 8.1*   PT/INR No results for input(s): LABPROT, INR in the last 72 hours. ABG No results for input(s): PHART, HCO3 in the last 72 hours.  Invalid input(s): PCO2, PO2  Studies/Results: No results found.  Anti-infectives: Anti-infectives    Start     Dose/Rate Route Frequency Ordered Stop   08/27/14 1200  vancomycin (VANCOCIN) IVPB 1000 mg/200 mL premix  Status:  Discontinued     1,000 mg200 mL/hr over 60 Minutes Intravenous Every 12 hours 08/26/14 2125 08/27/14 0759   08/27/14 0830  metroNIDAZOLE (FLAGYL) IVPB 500 mg     500 mg100 mL/hr over 60 Minutes Intravenous 3 times per day 08/27/14 0759     08/26/14 2300  vancomycin (VANCOCIN) 2,500 mg in sodium chloride 0.9 % 500 mL IVPB     2,500 mg250 mL/hr over 120 Minutes Intravenous  Once 08/26/14 2125 08/27/14 0050   08/26/14 2200  piperacillin-tazobactam (ZOSYN) IVPB 3.375 g     3.375 g12.5 mL/hr over 240 Minutes Intravenous 3 times  per day 08/26/14 2125        Assessment/Plan: s/p Procedure(s): APPENDECTOMY LAPAROSCOPIC (N/A) SMALL BOWEL RESECTION (N/A) LAPAROSCOPY DIAGNOSTIC  Advance to reg diet  Ok for any blood thinning meds from surgical standpoint.    [place] for d/c.  F/U next week for staple removal in office and in 2 weeks with Dr Ninfa Linden.  Could d/c Zosyn and just send home on flagyl for c diff treatment.  LOS: 7 days    Mayes Sangiovanni C. [id number]

## 2014-09-02 NOTE — Progress Notes (Signed)
IV removed per order. Dressing materials given to patient. Discharge instructions given with good teachback. Discharged via wheelchair to wife's care with NT present. Melford Aase, RN

## 2014-09-02 NOTE — Progress Notes (Signed)
Patient ID: Dylan [name], male   DOB: 18-Nov-1965, 49 y.o.   MRN: [id number]  No further recs, resume meds per recommendation of Dr Ellyn Hack from yesterdays note. Will need outpatient follow in 2-3 weeks. Will signoff inpatient care.           Carlyle Dolly, M.D

## 2014-09-02 NOTE — Discharge Instructions (Signed)

## 2014-09-06 ENCOUNTER — Telehealth: Payer: Self-pay | Admitting: *Deleted

## 2014-09-06 NOTE — Telephone Encounter (Signed)
[name] PA called, wanted to get pt on Repatha. Apparently this had been discussed by Dr. Ellyn Hack during patient's recent hospitalization.   PA was not able to prescribe due to hard stop requiring cardiologist to write for the med.  He states needs dx of heterozygous hypercholesterolemia for this to go through.  Pt is currently on zetia w/ some results & cannot take statins.  Pt has been provided w/ samples until able to get filled.

## 2014-09-07 NOTE — Telephone Encounter (Signed)
Dylan [name]  Send his info to scheduling for an appt with [facility]

## 2014-09-07 NOTE — Telephone Encounter (Signed)
Request sent to schedulers to set up appt. Will close this encounter.

## 2014-09-07 NOTE — Telephone Encounter (Signed)
He will need to be seen by Dylan [name] to start the paperwork.  Dylan [name]

## 2014-09-07 NOTE — Telephone Encounter (Signed)
[place] think he is supposed to have a Hospital f/u visit soon.   Can get labs then.  [place]

## 2014-09-07 NOTE — Telephone Encounter (Signed)
I cannot see patients until they are under care of one of our cardiologists.  Hospital visits don't count.  He actually needs to come in for follow up.   Also I would need to see his most recent lipid panel.  Last one in system (2014?) shows high triglycerides, unable to calculate LDL. For that he needs fenofibrate and fish oil.  PCSK-9 inhibitors don't treat trigs.  Would suggest he get appt with MD first, then we can determine where his need is.

## 2014-09-08 ENCOUNTER — Ambulatory Visit (INDEPENDENT_AMBULATORY_CARE_PROVIDER_SITE_OTHER): Payer: BLUE CROSS/BLUE SHIELD | Admitting: Cardiology

## 2014-09-08 VITALS — BP 110/70 | HR 93 | Ht 74.0 in | Wt 263.0 lb

## 2014-09-08 DIAGNOSIS — K550 Acute vascular disorders of intestine: Secondary | ICD-10-CM

## 2014-09-08 DIAGNOSIS — IMO0002 Reserved for concepts with insufficient information to code with codable children: Secondary | ICD-10-CM

## 2014-09-08 DIAGNOSIS — Z79899 Other long term (current) drug therapy: Secondary | ICD-10-CM

## 2014-09-08 DIAGNOSIS — I214 Non-ST elevation (NSTEMI) myocardial infarction: Secondary | ICD-10-CM

## 2014-09-08 DIAGNOSIS — E785 Hyperlipidemia, unspecified: Secondary | ICD-10-CM

## 2014-09-08 DIAGNOSIS — F17200 Nicotine dependence, unspecified, uncomplicated: Secondary | ICD-10-CM

## 2014-09-08 DIAGNOSIS — T82867S Thrombosis of cardiac prosthetic devices, implants and grafts, sequela: Secondary | ICD-10-CM

## 2014-09-08 DIAGNOSIS — T82857S Stenosis of cardiac prosthetic devices, implants and grafts, sequela: Secondary | ICD-10-CM

## 2014-09-08 DIAGNOSIS — Z720 Tobacco use: Secondary | ICD-10-CM

## 2014-09-08 DIAGNOSIS — E781 Pure hyperglyceridemia: Secondary | ICD-10-CM

## 2014-09-08 DIAGNOSIS — K55069 Acute infarction of intestine, part and extent unspecified: Secondary | ICD-10-CM

## 2014-09-08 DIAGNOSIS — Z9861 Coronary angioplasty status: Secondary | ICD-10-CM

## 2014-09-08 DIAGNOSIS — I2510 Atherosclerotic heart disease of native coronary artery without angina pectoris: Secondary | ICD-10-CM

## 2014-09-08 DIAGNOSIS — E669 Obesity, unspecified: Secondary | ICD-10-CM

## 2014-09-08 DIAGNOSIS — I10 Essential (primary) hypertension: Secondary | ICD-10-CM

## 2014-09-08 MED ORDER — ROSUVASTATIN CALCIUM 5 MG PO TABS: 5.0000 mg | ORAL_TABLET | Freq: Every day | ORAL | Status: DC

## 2014-09-08 NOTE — Patient Instructions (Addendum)
Your physician has recommended you make the following change in your medication: After February 13th decrease the aspirin to every other day until the end of the month then stop the beginning of march. Restart the fenofibrate 160 mg.  Start new prescription for Crestor 5 mg. Take 1 pill daily.   Your physician recommends that you return for lab work fasting now and again in 3 months prior to your next visit.  Your physician recommends that you schedule a follow-up appointment in: 3-4 months with Dr. Ellyn Hack.

## 2014-09-09 ENCOUNTER — Encounter: Payer: Self-pay | Admitting: Cardiology

## 2014-09-09 DIAGNOSIS — E669 Obesity, unspecified: Secondary | ICD-10-CM | POA: Insufficient documentation

## 2014-09-09 NOTE — Assessment & Plan Note (Signed)
Previously intolerant of statin. He needs to have his triglycerides treated primarily, but will also need aggressive lipid treatment. Based on discussion with our Clinical Pharmacist Erasmo Downer, we will initiate once weekly Crestor to see how well he tolerates it. We will check a baseline set of lipids for next week and then followup in 3 months. He will be monitored by Erasmo Downer and myself. If he meets criteria we will submit paperwork for Rapatha (PCSK-9 Inhibitor)

## 2014-09-09 NOTE — Assessment & Plan Note (Signed)
Currently well controlled on Toprol at current dose. He is also taking low-dose furosemide, mostly because of postoperative third spacing. He has not had heart failure symptoms and can probably cut back to as needed furosemide couple weeks. We will need to continue to follow his blood pressures and probably restart his ARB at next followup. For now we will hold the Benicar

## 2014-09-09 NOTE — Assessment & Plan Note (Signed)
We have not had labs checked for over a year and a half now. I noted recheck a lipid panel today. He was posted on fenofibrate which had been stopped postop. We will restart the fibroid now. I have referred him to [name], RPH-CCP who will initiate necessary procedures to consider for possible PCSK-9 Inhibitor treatment.

## 2014-09-09 NOTE — Assessment & Plan Note (Signed)
He did have angioplasty of stent thrombosis. He has had in-stent restenosis in the past. I would like her to be on dual and the therapy for at least one month after his angioplasty. After that point I think are okay stopping the aspirin and continuing with Pradaxa plus Plavix. It would be okay for him to hold his aspirin a couple days after staple removal to allow for less bleeding.

## 2014-09-09 NOTE — Assessment & Plan Note (Signed)
He really has no residual effect from his acute non-ST elevation MIs (one of them with a totally occluded RCA) with no regional wall motion abnormality noted. He doesn't extensive stents in the RCA, and is susceptible to in-stent stenosis and thrombosis and because the length of stents. I do think the last thrombotic event was probably a global phenomenon.

## 2014-09-09 NOTE — Assessment & Plan Note (Signed)
Smoking cessation instruction/counseling given:  commended patient for quitting and reviewed strategies for preventing relapses 

## 2014-09-09 NOTE — Assessment & Plan Note (Signed)
No significant disease noted in the LCA distribution. For the most part his stents in the RCA looked wide open.  Plan for now:  continue DAPT x 1 month post-PTCA period then stop aspirin.  Continue beta blocker, but with borderline pressures would not restart ARB for now.  We will need to address his poor controlled lipid profile.  He probably is not interested in cardiac rehabilitation, however I did talk about the importance of gradually increasing his activity level and continued exercise which should be fine once he is safe from a postoperative standpoint.

## 2014-09-09 NOTE — Assessment & Plan Note (Signed)
The patient understands the need to lose weight with diet and exercise. We have discussed specific strategies for this.  

## 2014-09-09 NOTE — Assessment & Plan Note (Signed)
The plan is to be maintained on Pradaxa. He will be following up with hematology to determine the course of treatment. My suspicion is it would be probably 6 months. After that, I would like for him to restart aspirin plus Plavix to complete one year from PTCA.  At that point I would probably maintain him on Plavix alone without aspirin. He would benefit from a hypercoagulable workup, although it would seem that the most likely culprit is the testosterone injections.

## 2014-09-09 NOTE — Progress Notes (Signed)
PATIENT: PRATIK DALZIEL MRN: [id number] DOB: 16-Aug-1965 PCP: [name], MD  Clinic Note: Chief Complaint  Patient presents with  . Hospitalization Follow-up    Complicated course: Initial mesenteric vein thrombosis status post thrombectomy complicated with non-STEMI/in-stent thrombosis with PTCA --> secondary hospitalization for bowel perforation status post exploratory laparoscopy with partial bowel resection    HPI: Dylan [name] is a 49 y.o. male with a PMH below who presents today for hospital followup and to reestablish cardiology care. He was initially a patient of Dr. Dani Gobble Croitoru who presented with a non-STEMI in November of 2001.  He has a history of hypertension, dyslipidemia as well as being a former smoker. After December 2013 where he had PTCA to in-stent restenosis, he was lost to followup.     Cardiac History:  November 2011: non- STEMI: 100% occluded RCA after conus branch --> Following initial angioplasty there was a long area of disease that required extensive PCI with multiple drug-eluting stents Taxs ION  3.0-3.5 mm stents).  December 2013: Unstable Angina --> Focal 75% ISR in mid RCA stent overlapped segment --> Angiosculpt PTCA  January 2016: Shortly after starting testosterone injections he presented with mesenteric vein thrombosis treated with thrombectomy. This was complicated by mild non-STEMI --> Was found to have diffuse in-stent thrombosis with no significant stenosis. PTCA was performed with disruption of thrombosis, but with residual thrombus noted.  After the initial hospitalization with mesenteric vein thrombectomy and RCA PTCA, he then presented again with acute abdominal pain was found to have a perforated small bowel that failed initial medical management. He subsequently underwent exploratory laparoscopy followed by partial bowel resection. He was maintained on IV heparin in the perioperative period and switched back to Pradaxa plus aspirin  and Plavix. He just had his staples taken out and has mild oozing at the incision site.  Interval History: since his discharge, and really leading up to his most recent event, he has not had any resting or exertional anginal symptoms. He denied any PND, orthopnea or edema. He is a little bit out of shape so he is noticing a lot of exertional dyspnea, but nothing new. The remainder of cardiac review of systems is as follows:  positive for - Mild exertional dyspnea is noted. negative for - chest pain, edema, irregular heartbeat, loss of consciousness, murmur, orthopnea, palpitations, paroxysmal nocturnal dyspnea, rapid heart rate, shortness of breath or  [place]//near-syncope, TIA/amaurosis fgax. : He is currently back on triple therapy with aspirin/Plavix/Pradaxa. He has not had any melena, hematochezia, hematuria, but has noted some oozing today from his incision site after staple removal.  Past Medical History  Diagnosis Date  . CAD S/P percutaneous coronary angioplasty 07/2010; 07/2012; 08/2014    1) 2/'11: MI - 100% RCA - PCI (3 Taxus ION DES 3.0 x 28, 3.0 x 24 & 3.5 x 12 distal-prox); b) ISR of RCA stent -AnngioSculpt PTCA; c) NSTEMI 1/'16: mild ISR with Thrombosis of RCA Stents (in setting of Mesenteric Vein Thrombosis) - Cutting Balloon PTCA  . NSTEMI (non-ST elevated myocardial infarction) 07/2010, 08/2014    And unstable angina in February 20 13th; Echo 1/12/'16: Moderate concentric LVH. EF 60-65% with no regional WMA. Gr 1 DD, otherwise normal  . Stroke 2011; 2013    S/P cardiac cath - embolic stroke; denies residual (07/06/2012)  . 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 04/19/2013  No evidence of CVA on MRI/MRA & Dliated Eye Examination. ruptured blood vessel & not occlusion.  . Coronary stent thrombosis 08/15/2014  . Hyperlipidemia with target LDL less than 70 09/01/2014  . Mesenteric  vein thrombosis   . Essential hypertension 07/07/2012  . Migraines     "maybe one/yr" (07/06/2012)    Prior Cardiac Evaluation and Past Surgical History: Past Surgical History  Procedure Laterality Date  . Septoplasty  2001  . Coronary angioplasty with stent placement  07/2010    inferior wall MI - 3 Taxus Ion DES (3.0x76mm, 3.0x83mm, 3.5x71mm) to prox and mid RCA  . Coronary angioplasty  07/06/2012; 08/15/2014    a) 12/'13: PTCA of 80% ISR mRCA - AngioSculpt; b) PTCA of  ISR/thrombosis  . Cardiac catheterization  04/18/2013  . Left heart catheterization with coronary angiogram N/A 06/11/2012    Procedure: LEFT HEART CATHETERIZATION WITH CORONARY ANGIOGRAM;  Surgeon: [name], MD;  Location: [facility] CATH LAB;  Service: Cardiovascular;  Laterality: N/A;  . Percutaneous coronary stent intervention (pci-s) N/A 07/06/2012    Procedure: PERCUTANEOUS CORONARY STENT INTERVENTION (PCI-S);  Surgeon: [name], MD;  Location[facility] CATH LAB;  Service: Cardiovascular;  Laterality: N/A;  . Left heart catheterization with coronary angiogram N/A 04/18/2013    Procedure: LEFT HEART CATHETERIZATION WITH CORONARY ANGIOGRAM;  Surgeon: [name], MD;  Location: [facility] CATH LAB;  Service: Cardiovascular;  Laterality: N/A;  . Left heart catheterization with coronary angiogram N/A 08/15/2014    Procedure: LEFT HEART CATHETERIZATION WITH CORONARY ANGIOGRAM;  Surgeon: [name], MD;  Location: [facility] CATH LAB;  Service: Cardiovascular;  Laterality: N/A;  . Laparoscopic appendectomy N/A 08/28/2014    Procedure: APPENDECTOMY LAPAROSCOPIC;  Surgeon: [name], MD;  Location: [place];  Service: General;  Laterality: N/A;  . Bowel resection N/A 08/28/2014    Procedure: SMALL BOWEL RESECTION;  Surgeon: [name], MD;  Location: [place];  Service: General;  Laterality: N/A;  . Laparoscopy  08/28/2014    Procedure: LAPAROSCOPY DIAGNOSTIC;  Surgeon: [name], MD;  Location: [place];  Service: General;;  .  Transthoracic echocardiogram  08/15/2014    Moderate concentric LVH. EF 60-65% with no regional WMA. Gr 1 DD, otherwise normal    Allergies  Allergen Reactions  . Ace Inhibitors Cough  . Lipitor [Atorvastatin] Other (See Comments)    myalgia  . Zofran [Ondansetron Hcl] Nausea And Vomiting  . Codeine Itching    Current Outpatient Prescriptions  Medication Sig Dispense Refill  . acetaminophen (TYLENOL) 500 MG tablet Take 1 tablet (500 mg total) by mouth every 4 (four) hours as needed for mild pain, fever or headache.    Marland Kitchen aspirin 81 MG chewable tablet Chew 1 tablet (81 mg total) by mouth daily.    . clopidogrel (PLAVIX) 75 MG tablet Take 1 tablet (75 mg total) by mouth daily with breakfast. 30 tablet 0  . dabigatran (PRADAXA) 150 MG CAPS capsule Take 1 capsule (150 mg total) by mouth every 12 (twelve) hours. 60 capsule 0  . ferrous sulfate (FERROUSUL) 325 (65 FE) MG tablet Take 1 tablet (325 mg total) by mouth 2 (two) times daily with a meal. 60 tablet 0  . furosemide (LASIX) 20 MG tablet Take 20 mg by mouth daily.    Marland Kitchen KRILL OIL PO Take 1 capsule by mouth daily.    . metoprolol succinate (TOPROL-XL) 50 MG 24 hr tablet Take 50 mg by mouth daily. Take with or immediately following a meal.    .  nitroGLYCERIN (NITROSTAT) 0.4 MG SL tablet Place 1 tablet (0.4 mg total) under the tongue every 5 (five) minutes x 3 doses as needed for chest pain. 25 tablet 2  . olmesartan-hydrochlorothiazide (BENICAR HCT) 40-25 MG per tablet Take 0.5 tablets by mouth daily. 30 tablet 0  . rosuvastatin (CRESTOR) 5 MG tablet Take 1 tablet (5 mg total) by mouth daily. 30 tablet 0   No current facility-administered medications for this visit.    History   Social History  . Marital Status: Married    Spouse Name: N/A    Number of Children: 2  . Years of Education: 12   Occupational History  . English as a second language teacher   Social History Main Topics  . Smoking status: Former Smoker -- 1.00 packs/day for 15 years     Types: Cigarettes    Quit date: 06/30/2010  . Smokeless tobacco: Former Systems developer    Types: [place] date: 06/30/2010  . Alcohol Use: 16.8 oz/week    28 Shots of liquor per week     Comment: 04/18/2013 "2 drinks/ day w/maybe 2 shots in each drink"  . Drug Use: No  . Sexual Activity: Yes   Other Topics Concern  . None   Social History Narrative    family history includes Atrial fibrillation in his mother; Coronary artery disease in his father; Mitral valve prolapse in his mother.  ROS: A comprehensive Review of Systems - Negative except Symptoms noted in history of present illness, and below Gastrointestinal ROS: positive for - abdominal pain and Expected postoperative discomfort, mild oozing from incision negative for - appetite loss, blood in stools, constipation, diarrhea, gas/bloating, heartburn, hematemesis, melena or stool incontinence  PHYSICAL EXAM BP 110/70 mmHg  Pulse 93  Ht 6\' 2"  (1.88 m)  Wt 263 lb (119.296 kg)  BMI 33.75 kg/m2 General appearance: alert, cooperative, appears stated age, no distress, mildly obese and Healthy-appearing. Neck: no adenopathy, no carotid bruit, no JVD and supple, symmetrical, trachea midline Lungs: clear to auscultation bilaterally, normal percussion bilaterally and Nonlabored, good air movement Heart: regular rate and rhythm, S1, S2 normal, no murmur, click, rub or gallop and normal apical impulse Abdomen: Soft, appropriately tender around the incision site. No HSM. He does have some mild serosanguineous drainage from the incision site following suture removal. Mild staining on the gauze but not significant amount of blood. Extremities: extremities normal, atraumatic, no cyanosis or edema Pulses: 2+ and symmetric Neurologic: Alert and oriented X 3, normal strength and tone. Normal symmetric reflexes. Normal coordination and gait   Adult ECG Report  Rate: 93 ;  Rhythm: normal sinus rhythm, premature atrial contractions (PAC) and  Nonspecific T wave inversions in lead 3.   Narrative Interpretation: essentially normal EKG with exception of PVCs and nonspecific T-wave changes. Normal axis, intervals and durations. Borderline low voltage in the precordial leads probably because of his body habitus. There is also baseline wander giving the appearance of possible atrial fibrillation of clear P waves are noted.  Recent Labs:  Lab Results  Component Value Date   CHOL 201* 04/18/2013   HDL 28* 04/18/2013   LDLCALC UNABLE TO CALCULATE IF TRIGLYCERIDE OVER 400 mg/dL 04/18/2013   TRIG 585* 04/18/2013   CHOLHDL 7.2 04/18/2013     Chemistry      Component Value Date/Time   NA 140 09/01/2014 0425   K 3.7 09/01/2014 0425   CL 106 09/01/2014 0425   CO2 28 09/01/2014 0425   BUN 9 09/01/2014 0425  CREATININE 0.92 09/01/2014 0425      Component Value Date/Time   CALCIUM 8.1* 09/01/2014 0425   ALKPHOS 59 08/27/2014 0325   AST 19 08/27/2014 0325   ALT 21 08/27/2014 0325   BILITOT 1.1 08/27/2014 0325      ASSESSMENT / PLAN: Very pleasant gentleman now reestablishing cardiology care after being lost to followup. I do believe that the most recent cardiac event is probably related to his mesenteric vein thrombosis as a global hypercoagulable state probably occurred, possibly because of his testosterone injections. He is doing well from following his multiple procedures including exploratory laparoscopy and bowel resection. No bleeding issues. No recurrent anginal symptoms.  NSTEMI (non-ST elevated myocardial infarction) - 07/2010, 2013, 08/2014 He really has no residual effect from his acute non-ST elevation MIs (one of them with a totally occluded RCA) with no regional wall motion abnormality noted. He doesn't extensive stents in the RCA, and is susceptible to in-stent stenosis and thrombosis and because the length of stents. I do think the last thrombotic event was probably a global phenomenon.   Coronary stent thrombosis He  did have angioplasty of stent thrombosis. He has had in-stent restenosis in the past. I would like her to be on dual and the therapy for at least one month after his angioplasty. After that point I think are okay stopping the aspirin and continuing with Pradaxa plus Plavix. It would be okay for him to hold his aspirin a couple days after staple removal to allow for less bleeding.   CAD S/P PCI with 3 DES stents to RCA and PTCA x2 for ISR and thrombosis  No significant disease noted in the LCA distribution. For the most part his stents in the RCA looked wide open.  Plan for now:  continue DAPT x 1 month post-PTCA period then stop aspirin.  Continue beta blocker, but with borderline pressures would not restart ARB for now.  We will need to address his poor controlled lipid profile.  He probably is not interested in cardiac rehabilitation, however I did talk about the importance of gradually increasing his activity level and continued exercise which should be fine once he is safe from a postoperative standpoint.   Hypertriglyceridemia - TG >500 We have not had labs checked for over a year and a half now. I noted recheck a lipid panel today. He was posted on fenofibrate which had been stopped postop. We will restart the fibroid now. I have referred him to [name], RPH-CCP who will initiate necessary procedures to consider for possible PCSK-9 Inhibitor treatment.   Hyperlipidemia with target LDL less than 70 Previously intolerant of statin. He needs to have his triglycerides treated primarily, but will also need aggressive lipid treatment. Based on discussion with our Clinical Pharmacist Erasmo Downer, we will initiate once weekly Crestor to see how well he tolerates it. We will check a baseline set of lipids for next week and then followup in 3 months. He will be monitored by Erasmo Downer and myself. If he meets criteria we will submit paperwork for Rapatha (PCSK-9 Inhibitor)   Superior  mesenteric vein thrombosis The plan is to be maintained on Pradaxa. He will be following up with hematology to determine the course of treatment. My suspicion is it would be probably 6 months. After that, I would like for him to restart aspirin plus Plavix to complete one year from PTCA.  At that point I would probably maintain him on Plavix alone without aspirin. He would benefit from a  hypercoagulable workup, although it would seem that the most likely culprit is the testosterone injections.   Obesity (BMI 30-39.9) The patient understands the need to lose weight with diet and exercise. We have discussed specific strategies for this.    Essential hypertension Currently well controlled on Toprol at current dose. He is also taking low-dose furosemide, mostly because of postoperative third spacing. He has not had heart failure symptoms and can probably cut back to as needed furosemide couple weeks. We will need to continue to follow his blood pressures and probably restart his ARB at next followup. For now we will hold the Benicar   Smoker, quit 06/30/10 Smoking cessation instruction/counseling given:  commended patient for quitting and reviewed strategies for preventing relapses     Orders Placed This Encounter  Procedures  . Lipid panel  . Hepatic function panel  . Hepatic function panel    Standing Status: Future     Number of Occurrences:      Standing Expiration Date: 09/09/2015  . Lipid panel    Standing Status: Future     Number of Occurrences:      Standing Expiration Date: 09/09/2015  . EKG 12-Lead   Meds ordered this encounter  Medications  . KRILL OIL PO    Sig: Take 1 capsule by mouth daily.  . rosuvastatin (CRESTOR) 5 MG tablet    Sig: Take 1 tablet (5 mg total) by mouth daily.    Dispense:  30 tablet    Refill:  0    Followup: 3-4 months  DAVID W. Ellyn Hack, M.D., M.S. Interventional Cardiolgy [facility]

## 2014-09-17 ENCOUNTER — Other Ambulatory Visit: Payer: Self-pay | Admitting: Cardiology

## 2014-09-18 NOTE — Telephone Encounter (Signed)
Rx(s) sent to pharmacy electronically.  

## 2014-09-20 LAB — HEPATIC FUNCTION PANEL
ALBUMIN: 4.1 g/dL (ref 3.5–5.2)
ALK PHOS: 76 U/L (ref 39–117)
ALT: 22 U/L (ref 0–53)
AST: 21 U/L (ref 0–37)
Bilirubin, Direct: 0.1 mg/dL (ref 0.0–0.3)
Indirect Bilirubin: 0.4 mg/dL (ref 0.2–1.2)
Total Bilirubin: 0.5 mg/dL (ref 0.2–1.2)
Total Protein: 7.3 g/dL (ref 6.0–8.3)

## 2014-09-20 LAB — LIPID PANEL
CHOL/HDL RATIO: 4.6 ratio
Cholesterol: 167 mg/dL (ref 0–200)
HDL: 36 mg/dL — ABNORMAL LOW (ref >39)
LDL Cholesterol: 101 mg/dL — ABNORMAL HIGH (ref 0–99)
TRIGLYCERIDES: 150 mg/dL — AB (ref <150)
VLDL: 30 mg/dL (ref 0–40)

## 2014-09-21 ENCOUNTER — Telehealth: Payer: Self-pay | Admitting: *Deleted

## 2014-09-21 NOTE — Telephone Encounter (Signed)
-----   Message from [name], MD sent at 09/20/2014  2:50 PM EST ----- Cholesterol panel looks much better with the current treatment. LDL is dramatically improved as has the  triglycerides and cholesterol.  Provided he is able tolerate the medicines that he is on I would continue the current regimen, and reassess in 6 months.  [place]

## 2014-09-21 NOTE — Telephone Encounter (Signed)
LEFT MESSAGE TO CALL BACK IN REGARD TO LAB RESULTS

## 2014-09-22 NOTE — Telephone Encounter (Signed)
Spoke to patient. Result given . Verbalized understanding Patient states he has tried  Crestor for the last 3 weeks or so. He developed leg pain. He states he would like to try REPATHA. - Primary has given him samples. RN informed patient  Will give information to [facility]- pharmacist- set up an appointment to discuss the process. Wait to order follow up labs until decision is made.

## 2014-09-27 ENCOUNTER — Other Ambulatory Visit: Payer: Self-pay | Admitting: Hematology and Oncology

## 2014-09-27 DIAGNOSIS — IMO0002 Reserved for concepts with insufficient information to code with codable children: Principal | ICD-10-CM

## 2014-09-27 DIAGNOSIS — K55069 Acute infarction of intestine, part and extent unspecified: Secondary | ICD-10-CM

## 2014-09-28 ENCOUNTER — Inpatient Hospital Stay: Payer: Self-pay | Admitting: Hematology and Oncology

## 2014-09-28 ENCOUNTER — Ambulatory Visit (HOSPITAL_BASED_OUTPATIENT_CLINIC_OR_DEPARTMENT_OTHER): Payer: BLUE CROSS/BLUE SHIELD | Admitting: Hematology and Oncology

## 2014-09-28 ENCOUNTER — Ambulatory Visit: Payer: BLUE CROSS/BLUE SHIELD

## 2014-09-28 ENCOUNTER — Telehealth: Payer: Self-pay | Admitting: Hematology and Oncology

## 2014-09-28 ENCOUNTER — Encounter: Payer: Self-pay | Admitting: Hematology and Oncology

## 2014-09-28 ENCOUNTER — Other Ambulatory Visit: Payer: Self-pay

## 2014-09-28 ENCOUNTER — Ambulatory Visit: Payer: Self-pay

## 2014-09-28 ENCOUNTER — Other Ambulatory Visit (HOSPITAL_BASED_OUTPATIENT_CLINIC_OR_DEPARTMENT_OTHER): Payer: BLUE CROSS/BLUE SHIELD

## 2014-09-28 VITALS — BP 109/65 | HR 90 | Temp 97.8°F | Resp 18 | Ht 74.0 in | Wt 241.4 lb

## 2014-09-28 DIAGNOSIS — I252 Old myocardial infarction: Secondary | ICD-10-CM

## 2014-09-28 DIAGNOSIS — I81 Portal vein thrombosis: Secondary | ICD-10-CM

## 2014-09-28 DIAGNOSIS — N19 Unspecified kidney failure: Secondary | ICD-10-CM

## 2014-09-28 DIAGNOSIS — IMO0002 Reserved for concepts with insufficient information to code with codable children: Principal | ICD-10-CM

## 2014-09-28 DIAGNOSIS — Z9861 Coronary angioplasty status: Secondary | ICD-10-CM

## 2014-09-28 DIAGNOSIS — I2510 Atherosclerotic heart disease of native coronary artery without angina pectoris: Secondary | ICD-10-CM

## 2014-09-28 DIAGNOSIS — D509 Iron deficiency anemia, unspecified: Secondary | ICD-10-CM

## 2014-09-28 DIAGNOSIS — K55069 Acute infarction of intestine, part and extent unspecified: Secondary | ICD-10-CM

## 2014-09-28 LAB — CBC WITH DIFFERENTIAL/PLATELET
BASO%: 0.3 % (ref 0.0–2.0)
Basophils Absolute: 0 10*3/uL (ref 0.0–0.1)
EOS%: 1.4 % (ref 0.0–7.0)
Eosinophils Absolute: 0.1 10*3/uL (ref 0.0–0.5)
HCT: 37.6 % — ABNORMAL LOW (ref 38.4–49.9)
HGB: 12.2 g/dL — ABNORMAL LOW (ref 13.0–17.1)
LYMPH#: 2.4 10*3/uL (ref 0.9–3.3)
LYMPH%: 41.6 % (ref 14.0–49.0)
MCH: 27.7 pg (ref 27.2–33.4)
MCHC: 32.4 g/dL (ref 32.0–36.0)
MCV: 85.3 fL (ref 79.3–98.0)
MONO#: 0.5 10*3/uL (ref 0.1–0.9)
MONO%: 9.4 % (ref 0.0–14.0)
NEUT#: 2.7 10*3/uL (ref 1.5–6.5)
NEUT%: 47.3 % (ref 39.0–75.0)
Platelets: 279 10*3/uL (ref 140–400)
RBC: 4.41 10*6/uL (ref 4.20–5.82)
RDW: 14.1 % (ref 11.0–14.6)
WBC: 5.7 10*3/uL (ref 4.0–10.3)

## 2014-09-28 LAB — BASIC METABOLIC PANEL (CC13)
Anion Gap: 11 mEq/L (ref 3–11)
BUN: 33.3 mg/dL — ABNORMAL HIGH (ref 7.0–26.0)
CHLORIDE: 104 meq/L (ref 98–109)
CO2: 25 mEq/L (ref 22–29)
Calcium: 9.5 mg/dL (ref 8.4–10.4)
Creatinine: 1.5 mg/dL — ABNORMAL HIGH (ref 0.7–1.3)
EGFR: 54 mL/min/{1.73_m2} — AB (ref >90)
Glucose: 134 mg/dl (ref 70–140)
Potassium: 4.3 mEq/L (ref 3.5–5.1)
Sodium: 140 mEq/L (ref 136–145)

## 2014-09-28 MED ORDER — DABIGATRAN ETEXILATE MESYLATE 150 MG PO CAPS: 150.0000 mg | ORAL_CAPSULE | Freq: Two times a day (BID) | ORAL | Status: DC

## 2014-09-28 MED ORDER — CLOPIDOGREL BISULFATE 75 MG PO TABS: 75.0000 mg | ORAL_TABLET | Freq: Every day | ORAL | Status: DC

## 2014-09-28 NOTE — Progress Notes (Signed)
Checked in new pt with no financial concerns at this time.  Pt states he's here for a hematology concern so financial assistance may not be needed but he has Raquel's card for any billing questions or concerns.

## 2014-09-28 NOTE — Telephone Encounter (Signed)
Gave avs & calendar for June. °

## 2014-09-29 ENCOUNTER — Telehealth: Payer: Self-pay | Admitting: Hematology and Oncology

## 2014-09-29 ENCOUNTER — Encounter: Payer: Self-pay | Admitting: Hematology and Oncology

## 2014-09-29 DIAGNOSIS — N19 Unspecified kidney failure: Secondary | ICD-10-CM | POA: Insufficient documentation

## 2014-09-29 DIAGNOSIS — D509 Iron deficiency anemia, unspecified: Secondary | ICD-10-CM | POA: Insufficient documentation

## 2014-09-29 NOTE — Assessment & Plan Note (Signed)
His last episode of thrombosis were provoked by testosterone replacement therapy He is currently doing well with anticoagulation therapy. Minimum treatment would be for 6 months. I plan to see him back in June for further follow-up. The patient is instructed not to take hormonal supplement anymore in the future.

## 2014-09-29 NOTE — Assessment & Plan Note (Signed)
The patient is currently on maximum cardiac medication management along with dual antiplatelet agents. He is doing well without any further anginal episodes. He will continue close follow-up with cardiology.

## 2014-09-29 NOTE — Telephone Encounter (Signed)
s.w. pt and advised on 3.2 appt....pt ok and aware

## 2014-09-29 NOTE — Assessment & Plan Note (Signed)
He has mild acute renal failure could be related to dehydration. I recommend the patient to increase his oral fluid intake and I plan to recheck it again next week.

## 2014-09-29 NOTE — Progress Notes (Signed)
[facility] OFFICE PROGRESS NOTE  [name], MD SUMMARY OF HEMATOLOGIC HISTORY: I saw the patient in January 2016 after he was admitted with severe, sharp, 4 day history of diffuse abdominal pain, decreased oral intake, nausea, non-bloody vomiting and diarrhea. This event was preceded by an upper respiratory infection 5 days earlier treated with azithromycin and prednisone.  A CT of the abdomen and pelvis with contrast on 1/1 showed findings consistent with small bowel enteritis. 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  He admits to alcohol habituation, denies tobacco use since 2011 when he had a heart attack. He denies the use of recreational drugs. He reports a prior history of CVA in 2011, hypercholesterolemia, a remote history of pancreatitis and prior history of myocardial infarction. He denies a prior history of blood clot. He takes testosterone cypionate injections since 06/07/14. No recent long distance trip. He is very active, walks about 6 miles daily. He reports bilateral lower extremity swelling without tenderness or erythema. He denies any recent PICC line placement other than the one performed on admission. He denies any recent surgery. He denies any prior history or diagnosis of cancer. His screening programs are up-to-date.  He was placed on heparin in addition to supportive measures for the treatment of colitis with cipro and flagyl, pain meds and IV hydration. A repeat CT of the abdomen on 1/4 showed persistent small bowel wall thickening in the right abdomen, suspicious for bowel ischemia in the setting of SMV thrombosis, and increased proximal small bowel dilatation, suspicious for small bowel obstruction. Ascites progressed. He underwent transhepatic superior mesenteric vein / portal vein SMV/PV) lysis by interventional radiology on 08/08/14 with good  response. He is currently on tPA and heparin. He has strong family history of blood clots in both his parents. Soon after that, he was found to have coronary artery disease requiring stent placement. Subsequently, he was readmitted to the hospital with bowel perforation requiring surgery. INTERVAL HISTORY: Dylan [name] 49 y.o. male returns for further follow-up. Currently, he is taking aspirin, Plavix, and Pradaxa. The patient denies any recent signs or symptoms of bleeding such as spontaneous epistaxis, hematuria or hematochezia. He denies chest pain or shortness of breath. He has intermittent pain at the recent surgical incision site. I have reviewed the past medical history, past surgical history, social history and family history with the patient and they are unchanged from previous note.  ALLERGIES:  is allergic to ace inhibitors; lipitor; crestor; zofran; and codeine.  MEDICATIONS:  Current Outpatient Prescriptions  Medication Sig Dispense Refill  . aspirin 81 MG chewable tablet Chew 1 tablet (81 mg total) by mouth daily.    . clopidogrel (PLAVIX) 75 MG tablet Take 1 tablet (75 mg total) by mouth daily with breakfast. 90 tablet 1  . dabigatran (PRADAXA) 150 MG CAPS capsule Take 1 capsule (150 mg total) by mouth 2 (two) times daily. 60 capsule 5  . fenofibrate 160 MG tablet Take 1 tablet (160 mg total) by mouth daily. 30 tablet 11  . ferrous sulfate (FERROUSUL) 325 (65 FE) MG tablet Take 1 tablet (325 mg total) by mouth 2 (two) times daily with a meal. 60 tablet 0  . furosemide (LASIX) 20 MG tablet Take 20 mg by mouth daily.    Marland Kitchen KRILL OIL PO Take 1 capsule by mouth daily.    . metoprolol succinate (TOPROL-XL) 50 MG  24 hr tablet Take 50 mg by mouth daily. Take with or immediately following a meal.    . olmesartan-hydrochlorothiazide (BENICAR HCT) 40-25 MG per tablet Take 0.5 tablets by mouth daily. 30 tablet 0  . acetaminophen (TYLENOL) 500 MG tablet Take 1 tablet (500 mg total) by  mouth every 4 (four) hours as needed for mild pain, fever or headache. (Patient not taking: Reported on 09/28/2014)    . allopurinol (ZYLOPRIM) 300 MG tablet   4  . nitroGLYCERIN (NITROSTAT) 0.4 MG SL tablet Place 1 tablet (0.4 mg total) under the tongue every 5 (five) minutes x 3 doses as needed for chest pain. (Patient not taking: Reported on 09/28/2014) 25 tablet 2  . ZETIA 10 MG tablet   1   No current facility-administered medications for this visit.     REVIEW OF SYSTEMS:   Constitutional: Denies fevers, chills or night sweats Eyes: Denies blurriness of vision Ears, nose, mouth, throat, and face: Denies mucositis or sore throat Respiratory: Denies cough, dyspnea or wheezes Cardiovascular: Denies palpitation, chest discomfort or lower extremity swelling Gastrointestinal:  Denies nausea, heartburn or change in bowel habits Skin: Denies abnormal skin rashes Lymphatics: Denies new lymphadenopathy or easy bruising Neurological:Denies numbness, tingling or new weaknesses Behavioral/Psych: Mood is stable, no new changes  All other systems were reviewed with the patient and are negative.  PHYSICAL EXAMINATION: ECOG PERFORMANCE STATUS: 0 - Asymptomatic  Filed Vitals:   09/28/14 1449  BP: 109/65  Pulse: 90  Temp: 97.8 F (36.6 C)  Resp: 18   Filed Weights   09/28/14 1449  Weight: 241 lb 6.4 oz (109.498 kg)    GENERAL:alert, no distress and comfortable SKIN: skin color, texture, turgor are normal, no rashes or significant lesions EYES: normal, Conjunctiva are pink and non-injected, sclera clear OROPHARYNX:no exudate, no erythema and lips, buccal mucosa, and tongue normal  ABDOMEN:abdomen soft, non-tender and normal bowel sounds. Well-healed surgical scar Musculoskeletal:no cyanosis of digits and no clubbing  NEURO: alert & oriented x 3 with fluent speech, no focal motor/sensory deficits  LABORATORY DATA:  I have reviewed the data as listed Results for orders placed or  performed in visit on 09/28/14 (from the past 48 hour(s))  CBC with Differential/Platelet     Status: Abnormal   Collection Time: 09/28/14  2:38 PM  Result Value Ref Range   WBC 5.7 4.0 - 10.3 10e3/uL   NEUT# 2.7 1.5 - 6.5 10e3/uL   HGB 12.2 (L) 13.0 - 17.1 g/dL   HCT 37.6 (L) 38.4 - 49.9 %   Platelets 279 140 - 400 10e3/uL   MCV 85.3 79.3 - 98.0 fL   MCH 27.7 27.2 - 33.4 pg   MCHC 32.4 32.0 - 36.0 g/dL   RBC 4.41 4.20 - 5.82 10e6/uL   RDW 14.1 11.0 - 14.6 %   lymph# 2.4 0.9 - 3.3 10e3/uL   MONO# 0.5 0.1 - 0.9 10e3/uL   Eosinophils Absolute 0.1 0.0 - 0.5 10e3/uL   Basophils Absolute 0.0 0.0 - 0.1 10e3/uL   NEUT% 47.3 39.0 - 75.0 %   LYMPH% 41.6 14.0 - 49.0 %   MONO% 9.4 0.0 - 14.0 %   EOS% 1.4 0.0 - 7.0 %   BASO% 0.3 0.0 - 2.0 %  Basic metabolic panel     Status: Abnormal   Collection Time: 09/28/14  2:38 PM  Result Value Ref Range   Sodium 140 136 - 145 mEq/L   Potassium 4.3 3.5 - 5.1 mEq/L   Chloride 104  98 - 109 mEq/L   CO2 25 22 - 29 mEq/L   Glucose 134 70 - 140 mg/dl   BUN 33.3 (H) 7.0 - 26.0 mg/dL   Creatinine 1.5 (H) 0.7 - 1.3 mg/dL   Calcium 9.5 8.4 - 10.4 mg/dL   Anion Gap 11 3 - 11 mEq/L   EGFR 54 (L) >90 ml/min/1.73 m2    Comment: eGFR is calculated using the CKD-EPI Creatinine Equation (2009)    Lab Results  Component Value Date   WBC 5.7 09/28/2014   HGB 12.2* 09/28/2014   HCT 37.6* 09/28/2014   MCV 85.3 09/28/2014   PLT 279 09/28/2014    ASSESSMENT & PLAN:  CAD S/P PCI with 3 DES stents to RCA and PTCA x2 for ISR and thrombosis  The patient is currently on maximum cardiac medication management along with dual antiplatelet agents. He is doing well without any further anginal episodes. He will continue close follow-up with cardiology.   Mesenteric vein thrombosis His last episode of thrombosis were provoked by testosterone replacement therapy He is currently doing well with anticoagulation therapy. Minimum treatment would be for 6 months. I plan  to see him back in June for further follow-up. The patient is instructed not to take hormonal supplement anymore in the future.   Prerenal renal failure He has mild acute renal failure could be related to dehydration. I recommend the patient to increase his oral fluid intake and I plan to recheck it again next week.   Iron deficiency anemia He has mild iron deficiency anemia and was taking oral iron supplement. His anemia has almost resolved. I recommend he reduce iron supplement to one tab daily until current prescription runs out.      All questions were answered. The patient knows to call the clinic with any problems, questions or concerns. No barriers to learning was detected.  I spent 25 minutes counseling the patient face to face. The total time spent in the appointment was 30 minutes and more than 50% was on counseling.     [facility], Chiana Wamser, MD 2/26/20162:08 PM

## 2014-09-29 NOTE — Assessment & Plan Note (Signed)
He has mild iron deficiency anemia and was taking oral iron supplement. His anemia has almost resolved. I recommend he reduce iron supplement to one tab daily until current prescription runs out.

## 2014-10-04 ENCOUNTER — Telehealth: Payer: Self-pay | Admitting: *Deleted

## 2014-10-04 ENCOUNTER — Encounter: Payer: Self-pay | Admitting: Cardiology

## 2014-10-04 ENCOUNTER — Other Ambulatory Visit (HOSPITAL_BASED_OUTPATIENT_CLINIC_OR_DEPARTMENT_OTHER): Payer: BLUE CROSS/BLUE SHIELD

## 2014-10-04 ENCOUNTER — Other Ambulatory Visit: Payer: Self-pay | Admitting: Hematology and Oncology

## 2014-10-04 DIAGNOSIS — N19 Unspecified kidney failure: Secondary | ICD-10-CM

## 2014-10-04 DIAGNOSIS — D509 Iron deficiency anemia, unspecified: Secondary | ICD-10-CM

## 2014-10-04 DIAGNOSIS — I81 Portal vein thrombosis: Secondary | ICD-10-CM

## 2014-10-04 LAB — CBC WITH DIFFERENTIAL/PLATELET
BASO%: 0.3 % (ref 0.0–2.0)
Basophils Absolute: 0 10*3/uL (ref 0.0–0.1)
EOS%: 1.2 % (ref 0.0–7.0)
Eosinophils Absolute: 0.1 10*3/uL (ref 0.0–0.5)
HEMATOCRIT: 38.7 % (ref 38.4–49.9)
HEMOGLOBIN: 12.6 g/dL — AB (ref 13.0–17.1)
LYMPH%: 35.5 % (ref 14.0–49.0)
MCH: 27.8 pg (ref 27.2–33.4)
MCHC: 32.6 g/dL (ref 32.0–36.0)
MCV: 85.4 fL (ref 79.3–98.0)
MONO#: 0.5 10*3/uL (ref 0.1–0.9)
MONO%: 7 % (ref 0.0–14.0)
NEUT#: 4.1 10*3/uL (ref 1.5–6.5)
NEUT%: 56 % (ref 39.0–75.0)
PLATELETS: 293 10*3/uL (ref 140–400)
RBC: 4.53 10*6/uL (ref 4.20–5.82)
RDW: 14 % (ref 11.0–14.6)
WBC: 7.3 10*3/uL (ref 4.0–10.3)
lymph#: 2.6 10*3/uL (ref 0.9–3.3)

## 2014-10-04 LAB — BUN AND CREATININE (CC13)
BUN: 27.4 mg/dL — ABNORMAL HIGH (ref 7.0–26.0)
Creatinine: 1.6 mg/dL — ABNORMAL HIGH (ref 0.7–1.3)
EGFR: 51 mL/min/{1.73_m2} — ABNORMAL LOW (ref >90)

## 2014-10-04 NOTE — Telephone Encounter (Signed)
-----   Message from Heath Lark, MD sent at 10/04/2014  3:42 PM EST ----- Regarding: abnormal Cr The creatinine is slightly worse, not sure if it is related to his BP pill (Benicar). I suggest he calls he cardiologist office whether that medication could be changed. He would need nephrology consult as well. He can still take Pradaxa now but needs close monitoring of his creatinine function. (either with PCP/CV or nephrology). I have placed order for neph consult ----- Message -----    From: [facility]    Sent: 10/04/2014   3:01 PM      To: Heath Lark, MD

## 2014-10-04 NOTE — Telephone Encounter (Signed)
Informed pt of lab results and Dr. Alvy Bimler message below.  He verbalized understanding and will contact his cardiologist to discuss medications and his Creatinine.  He will wait to hear about referral to Nephrologist.  Instructed him to call us back if any questions or concerns.

## 2014-10-05 ENCOUNTER — Other Ambulatory Visit: Payer: Self-pay | Admitting: Physician Assistant

## 2014-10-05 ENCOUNTER — Telehealth: Payer: Self-pay | Admitting: Pharmacist Clinician (PhC)/ Clinical Pharmacy Specialist

## 2014-10-05 DIAGNOSIS — I700 Atherosclerosis of aorta: Secondary | ICD-10-CM

## 2014-10-05 DIAGNOSIS — I159 Secondary hypertension, unspecified: Secondary | ICD-10-CM

## 2014-10-05 NOTE — Telephone Encounter (Signed)
Spoke with patient, stated he tried Crestor - took weekly x 3 doses but had to discontinue due to severe myalgia.  States still taking zetia and fenofibrate.  Will process forms with [employer].

## 2014-10-08 ENCOUNTER — Ambulatory Visit (HOSPITAL_BASED_OUTPATIENT_CLINIC_OR_DEPARTMENT_OTHER): Payer: BLUE CROSS/BLUE SHIELD | Attending: Physician Assistant

## 2014-10-08 ENCOUNTER — Encounter (HOSPITAL_COMMUNITY): Payer: Self-pay | Admitting: Emergency Medicine

## 2014-10-08 ENCOUNTER — Emergency Department (HOSPITAL_COMMUNITY): Payer: BLUE CROSS/BLUE SHIELD

## 2014-10-08 ENCOUNTER — Emergency Department (HOSPITAL_COMMUNITY)
Admission: EM | Admit: 2014-10-08 | Discharge: 2014-10-08 | Disposition: A | Discharge status: Home / Self Care | Payer: BLUE CROSS/BLUE SHIELD | Attending: Emergency Medicine | Admitting: Emergency Medicine

## 2014-10-08 VITALS — Ht 74.0 in | Wt 245.0 lb

## 2014-10-08 DIAGNOSIS — Z8673 Personal history of transient ischemic attack (TIA), and cerebral infarction without residual deficits: Secondary | ICD-10-CM | POA: Insufficient documentation

## 2014-10-08 DIAGNOSIS — R42 Dizziness and giddiness: Secondary | ICD-10-CM

## 2014-10-08 DIAGNOSIS — K55069 Acute infarction of intestine, part and extent unspecified: Secondary | ICD-10-CM | POA: Diagnosis present

## 2014-10-08 DIAGNOSIS — Z7982 Long term (current) use of aspirin: Secondary | ICD-10-CM | POA: Diagnosis not present

## 2014-10-08 DIAGNOSIS — R079 Chest pain, unspecified: Secondary | ICD-10-CM

## 2014-10-08 DIAGNOSIS — I81 Portal vein thrombosis: Secondary | ICD-10-CM

## 2014-10-08 DIAGNOSIS — Z79899 Other long term (current) drug therapy: Secondary | ICD-10-CM | POA: Diagnosis not present

## 2014-10-08 DIAGNOSIS — I10 Essential (primary) hypertension: Secondary | ICD-10-CM | POA: Diagnosis not present

## 2014-10-08 DIAGNOSIS — I200 Unstable angina: Secondary | ICD-10-CM | POA: Diagnosis present

## 2014-10-08 DIAGNOSIS — Z87891 Personal history of nicotine dependence: Secondary | ICD-10-CM | POA: Diagnosis not present

## 2014-10-08 DIAGNOSIS — G4733 Obstructive sleep apnea (adult) (pediatric): Secondary | ICD-10-CM | POA: Insufficient documentation

## 2014-10-08 DIAGNOSIS — Z8639 Personal history of other endocrine, nutritional and metabolic disease: Secondary | ICD-10-CM | POA: Diagnosis not present

## 2014-10-08 DIAGNOSIS — Z7902 Long term (current) use of antithrombotics/antiplatelets: Secondary | ICD-10-CM | POA: Diagnosis not present

## 2014-10-08 DIAGNOSIS — I2510 Atherosclerotic heart disease of native coronary artery without angina pectoris: Secondary | ICD-10-CM | POA: Insufficient documentation

## 2014-10-08 DIAGNOSIS — H5461 Unqualified visual loss, right eye, normal vision left eye: Secondary | ICD-10-CM | POA: Diagnosis not present

## 2014-10-08 DIAGNOSIS — Z9861 Coronary angioplasty status: Secondary | ICD-10-CM

## 2014-10-08 DIAGNOSIS — R55 Syncope and collapse: Secondary | ICD-10-CM | POA: Diagnosis present

## 2014-10-08 DIAGNOSIS — E785 Hyperlipidemia, unspecified: Secondary | ICD-10-CM | POA: Diagnosis present

## 2014-10-08 LAB — CBC
HCT: 36.2 % — ABNORMAL LOW (ref 39.0–52.0)
Hemoglobin: 11.7 g/dL — ABNORMAL LOW (ref 13.0–17.0)
MCH: 27 pg (ref 26.0–34.0)
MCHC: 32.3 g/dL (ref 30.0–36.0)
MCV: 83.4 fL (ref 78.0–100.0)
Platelets: 287 10*3/uL (ref 150–400)
RBC: 4.34 MIL/uL (ref 4.22–5.81)
RDW: 13.8 % (ref 11.5–15.5)
WBC: 4.5 10*3/uL (ref 4.0–10.5)

## 2014-10-08 LAB — BASIC METABOLIC PANEL
Anion gap: 5 (ref 5–15)
BUN: 14 mg/dL (ref 6–23)
CO2: 28 mmol/L (ref 19–32)
Calcium: 9 mg/dL (ref 8.4–10.5)
Chloride: 105 mmol/L (ref 96–112)
Creatinine, Ser: 1.15 mg/dL (ref 0.50–1.35)
GFR calc Af Amer: 84 mL/min — ABNORMAL LOW (ref >90)
GFR calc non Af Amer: 73 mL/min — ABNORMAL LOW (ref >90)
Glucose, Bld: 121 mg/dL — ABNORMAL HIGH (ref 70–99)
Potassium: 4 mmol/L (ref 3.5–5.1)
Sodium: 138 mmol/L (ref 135–145)

## 2014-10-08 LAB — I-STAT TROPONIN, ED: Troponin i, poc: 0 ng/mL (ref 0.00–0.08)

## 2014-10-08 LAB — BRAIN NATRIURETIC PEPTIDE: B Natriuretic Peptide: 42.6 pg/mL (ref 0.0–100.0)

## 2014-10-08 LAB — TROPONIN I

## 2014-10-08 NOTE — ED Provider Notes (Signed)
CSN: [id number]     Arrival date & time 10/08/14  1051 History   First MD Initiated Contact with Patient 10/08/14 1124     Chief Complaint  Patient presents with  . Chest Pain     (Consider location/radiation/quality/duration/timing/severity/associated sxs/prior Treatment) HPI Dylan [name] is a 49 y.o. male with hx of CAD with multiple stents, last in 1/16,  who presents to ED today with complaint of chest pressure, and 2 near syncopal episodes today. States he woke up "not feeling well." reports feels chest pressure, "feels like something sitting on my chest". States this feels different from his prior MIs. States sat down in his recliner and checked his BP which was 009Q systolic. State he then had an episode where "my vision went white and I was dizzy like I was about to faint." He denies loosing consciousness completely. States took SL nitro which did not help. States he later had another similar episode, states took another ntiro and called EMS. EMS given him 325mg  aspirin, 2 additional Sl nitros and 6mg  of morphine. Pt states chest pressure subsided but is still there. No active dizziness. Pt states he was taken off benicar 4 days ago  Because of worsening renal function. States gained 5 lbs since yesterday. States feels hands and feet feel tight. No SOB.   Past Medical History  Diagnosis Date  . CAD S/P percutaneous coronary angioplasty 07/2010; 07/2012; 08/2014    1) 2/'11: MI - 100% RCA - PCI (3 Taxus ION DES 3.0 x 28, 3.0 x 24 & 3.5 x 12 distal-prox); b) ISR of RCA stent -AnngioSculpt PTCA; c) NSTEMI 1/'16: mild ISR with Thrombosis of RCA Stents (in setting of Mesenteric Vein Thrombosis) - Cutting Balloon PTCA  . NSTEMI (non-ST elevated myocardial infarction) 07/2010, 08/2014    And unstable angina in February 20 13th; Echo 1/12/'16: Moderate concentric LVH. EF 60-65% with no regional WMA. Gr 1 DD, otherwise normal  . Stroke 2011; 2013    S/P cardiac cath - embolic stroke; denies  residual (07/06/2012)  . 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 04/19/2013    No evidence of CVA on MRI/MRA & Dliated Eye Examination. ruptured blood vessel & not occlusion.  . Coronary stent thrombosis 08/15/2014  . Hyperlipidemia with target LDL less than 70 09/01/2014  . Mesenteric vein thrombosis   . Essential hypertension 07/07/2012  . Migraines     "maybe one/yr" (07/06/2012)   Past Surgical History  Procedure Laterality Date  . Septoplasty  2001  . Coronary angioplasty with stent placement  07/2010    inferior wall MI - 3 Taxus Ion DES (3.0x55mm, 3.0x80mm, 3.5x69mm) to prox and mid RCA  . Coronary angioplasty  07/06/2012; 08/15/2014    a) 12/'13: PTCA of 80% ISR mRCA - AngioSculpt; b) PTCA of  ISR/thrombosis  . Cardiac catheterization  04/18/2013  . Left heart catheterization with coronary angiogram N/A 06/11/2012    Procedure: LEFT HEART CATHETERIZATION WITH CORONARY ANGIOGRAM;  Surgeon: [name], MD;  Location: [place] CATH LAB;  Service: Cardiovascular;  Laterality: N/A;  . Percutaneous coronary stent intervention (pci-s) N/A 07/06/2012    Procedure: PERCUTANEOUS CORONARY STENT INTERVENTION (PCI-S);  Surgeon: [name], MD;  Location: [facility] CATH LAB;  Service: Cardiovascular;  Laterality: N/A;  . Left heart catheterization with coronary angiogram N/A 04/18/2013    Procedure: LEFT HEART CATHETERIZATION WITH CORONARY ANGIOGRAM;  Surgeon: Joyice Faster  Claiborne Billings, MD;  Location: [facility] CATH LAB;  Service: Cardiovascular;  Laterality: N/A;  . Left heart catheterization with coronary angiogram N/A 08/15/2014    Procedure: LEFT HEART CATHETERIZATION WITH CORONARY ANGIOGRAM;  Surgeon: [name], MD;  Location: [facility] CATH LAB;  Service: Cardiovascular;  Laterality: N/A;  . Laparoscopic appendectomy N/A 08/28/2014    Procedure: APPENDECTOMY LAPAROSCOPIC;  Surgeon: [name], MD;  Location: [place];   Service: General;  Laterality: N/A;  . Bowel resection N/A 08/28/2014    Procedure: SMALL BOWEL RESECTION;  Surgeon: [name], MD;  Location: [place];  Service: General;  Laterality: N/A;  . Laparoscopy  08/28/2014    Procedure: LAPAROSCOPY DIAGNOSTIC;  Surgeon: [name], MD;  Location: [place];  Service: General;;  . Transthoracic echocardiogram  08/15/2014    Moderate concentric LVH. EF 60-65% with no regional WMA. Gr 1 DD, otherwise normal   Family History  Problem Relation Age of Onset  . Atrial fibrillation Mother   . Mitral valve prolapse Mother   . Coronary artery disease Father     CABG, aortic aneursym,   History  Substance Use Topics  . Smoking status: Former Smoker -- 1.00 packs/day for 15 years    Types: Cigarettes    Quit date: 06/30/2010  . Smokeless tobacco: Former Systems developer    Types: [place] date: 06/30/2010  . Alcohol Use: 16.8 oz/week    28 Shots of liquor per week     Comment: 04/18/2013 "2 drinks/ day w/maybe 2 shots in each drink"    Review of Systems  Constitutional: Negative for fever and chills.  Respiratory: Positive for chest tightness. Negative for cough and shortness of breath.   Cardiovascular: Positive for chest pain and leg swelling. Negative for palpitations.  Gastrointestinal: Negative for nausea, vomiting, abdominal pain, diarrhea and abdominal distention.  Genitourinary: Negative for dysuria, urgency, frequency and hematuria.  Musculoskeletal: Negative for myalgias, arthralgias, neck pain and neck stiffness.  Skin: Negative for rash.  Allergic/Immunologic: Negative for immunocompromised state.  Neurological: Positive for dizziness and light-headedness. Negative for syncope, weakness, numbness and headaches.  All other systems reviewed and are negative.     Allergies  Ace inhibitors; Lipitor; Crestor; Zofran; and Codeine  Home Medications   Prior to Admission medications   Medication Sig Start Date End Date Taking? Authorizing  Provider  allopurinol (ZYLOPRIM) 300 MG tablet Take 300 mg by mouth daily.  07/03/14  Yes Historical Provider, MD  aspirin 81 MG chewable tablet Chew 1 tablet (81 mg total) by mouth daily. 07/07/12  Yes Erlene Quan, PA-C  clopidogrel (PLAVIX) 75 MG tablet Take 1 tablet (75 mg total) by mouth daily with breakfast. 09/28/14  Yes Heath Lark, MD  dabigatran (PRADAXA) 150 MG CAPS capsule Take 1 capsule (150 mg total) by mouth 2 (two) times daily. 09/28/14  Yes Heath Lark, MD  fenofibrate 160 MG tablet Take 1 tablet (160 mg total) by mouth daily. 09/18/14  Yes [name], MD  furosemide (LASIX) 20 MG tablet Take 20 mg by mouth daily.   Yes Historical Provider, MD  KRILL OIL PO Take 1 capsule by mouth daily.   Yes Historical Provider, MD  metoprolol succinate (TOPROL-XL) 50 MG 24 hr tablet Take 50 mg by mouth daily. Take with or immediately following a meal.   Yes Historical Provider, MD  nitroGLYCERIN (NITROSTAT) 0.4 MG SL tablet Place 1 tablet (0.4 mg total) under the tongue every 5 (five) minutes x 3 doses as needed  for chest pain. 07/07/12  Yes Luke K Kilroy, PA-C  olmesartan-hydrochlorothiazide (BENICAR HCT) 40-25 MG per tablet Take 0.5 tablets by mouth daily. 09/02/14  Yes Maryann Mikhail, DO  ZETIA 10 MG tablet Take 10 mg by mouth daily.  08/17/14  Yes Historical Provider, MD  acetaminophen (TYLENOL) 500 MG tablet Take 1 tablet (500 mg total) by mouth every 4 (four) hours as needed for mild pain, fever or headache. Patient not taking: Reported on 09/28/2014 08/16/14   Cherene Altes, MD  ferrous sulfate (FERROUSUL) 325 (65 FE) MG tablet Take 1 tablet (325 mg total) by mouth 2 (two) times daily with a meal. Patient not taking: Reported on 10/08/2014 09/02/14   Maryann Mikhail, DO   BP 116/70 mmHg  Temp(Src) 98.6 F (37 C) (Oral)  Resp 13  Ht 6\' 2"  (1.88 m)  Wt 245 lb (111.131 kg)  BMI 31.44 kg/m2  SpO2 96% Physical Exam  Constitutional: He is oriented to person, place, and time. He appears  well-developed and well-nourished. No distress.  HENT:  Head: Normocephalic and atraumatic.  Eyes: Conjunctivae are normal.  Neck: Neck supple.  Cardiovascular: Normal rate, regular rhythm and normal heart sounds.   Pulmonary/Chest: Effort normal. No respiratory distress. He has no wheezes. He has no rales. He exhibits no tenderness.  Abdominal: Soft. Bowel sounds are normal. He exhibits no distension. There is no tenderness. There is no rebound.  Musculoskeletal: He exhibits no edema.  Neurological: He is alert and oriented to person, place, and time.  Skin: Skin is warm and dry.  Nursing note and vitals reviewed.   ED Course  Procedures (including critical care time) Labs Review Labs Reviewed  CBC - Abnormal; Notable for the following:    Hemoglobin 11.7 (*)    HCT 36.2 (*)    All other components within normal limits  BASIC METABOLIC PANEL - Abnormal; Notable for the following:    Glucose, Bld 121 (*)    GFR calc non Af Amer 73 (*)    GFR calc Af Amer 84 (*)    All other components within normal limits  BRAIN NATRIURETIC PEPTIDE  TROPONIN I  Randolm Idol, ED    Imaging Review Dg Chest Port 1 View  10/08/2014   CLINICAL DATA:  Central chest pressure.  Presyncope.  EXAM: PORTABLE CHEST - 1 VIEW  COMPARISON:  08/11/2014  FINDINGS: Midline trachea. Normal heart size. No pleural effusion or pneumothorax. Apical lordotic positioning. Clear lungs.  IMPRESSION: No acute cardiopulmonary disease.   Electronically Signed   By: Abigail Miyamoto M.D.   On: 10/08/2014 12:24     EKG Interpretation   Date/Time:  Sunday October 08 2014 11:01:10 EST Ventricular Rate:  80 PR Interval:  154 QRS Duration: 81 QT Interval:  352 QTC Calculation: 406 R Axis:   21 Text Interpretation:  Sinus rhythm nsst changes Confirmed by Wilson Singer  MD,  STEPHEN ([id number]) on 10/08/2014 11:51:38 AM      MDM   Final diagnoses:  Chest pain, unspecified chest pain type  Dizziness   PT with extensive CAD, recent  stenting on 1/16. Today presents with chest tightness, near syncopal episode. ECG unchanged. Will get labs, CXR, monitor. Pt states pain is improving at this time. He received total of 4SL nitros, 6mg  of morphine, 235mg  aspirin    1:20 PM Labs, CXR unremarkable. VS normal. CP improving. Spoke with cardiology, will come by and see them.    4:15 PM Pt seen by cardiology. Delta trop negative. Spoke  with cardiology, pt ok to be d/c home with close outpatient follow up. Pt wants to go home. Will d/c home with return precautions.   Filed Vitals:   10/08/14 1430 10/08/14 1445 10/08/14 1530 10/08/14 1545  BP: 120/77 105/83 114/70 108/68  Pulse: 66 49 56 54  Temp:      TempSrc:      Resp: 17 18 16 20   Height:      Weight:      SpO2: 97% 97% 98% 95%     Renold Genta, PA-C 10/08/14 1646  Virgel Manifold, MD 10/09/14 1254

## 2014-10-08 NOTE — ED Notes (Signed)
Per EMS, pt coming from home. Pt was sitting in his recliner and started having substernal CP and felt like he was going pass out. Pt took two nitros with no relief so they called EMS. Pt received 324 of aspirin, 6mg  of morphine, and 2 more nitro in route. Pt rates his CP a 5 out of 10 at this time. NAD at this time. Pt alert x4.

## 2014-10-08 NOTE — H&P (Signed)
Dylan [name] is an 49 y.o. male.    Primary Cardiologist:Dr. Ellyn Hack PCP: [name], MD  Chief Complaint: chest pain, felt like he was going to pass out. HPI: Dylan [name] is a 49 y.o. male with history of inferior NSTEMI status post PCI to the mid RCA followed by redo PCI more proximally in the RCA in 2013 with treatment of proximal in-stent restenosis with a new stent proximally. He had been lost to follow-up for cardiology and had not been taking Plavix for over a year. He was admitted on January 1st 2016 for acute onset abdominal pain and was found to have a thrombosed superior mesenteric vein with extension into the portal vein. This was thought to be related to testosterone replacement. Treated with thrombolytic lysis VIR. Following this the patient developed acute onset of substernal chest pressure on the evening of the 10th/morning left of the 11th. He was diagnosed with NSTEMI. His cardiac catheterization was notable for RCA in-stent thrombosis with up to 70% luminal narrowing. He underwent PTCA with minimal amount of residual thrombus. He had been doing well until today.  Still out of work -healing from bowel resection.   Today he was sitting in his recliner and started having substernal chest pressure and then felt like he was going to pass out- lightheaded. His BP with the lightheadedness was 176/111 he took 2 NTG without relief but BP down to 120/80 and he felt better then returned. He then called EMS-  He rec'd ASA 324 mg and 6 mg morphine and 2 more NTG in ambulance.  Currently pain free and comfortable.  He has sleep study scheduled for tonight.  No recent cold or fevers, though his wife had GI bug Friday night.   Recent med changes:  Benicar/HCTZ 40/25 stopped for elevated Cr. Of 1.6 no other BP meds added.  Also his crestor stopped due to myalgias.  He continues on zetia and fenofibrate.  We are processing Repatha for him.   Troponin POC is 0.00- will  check troponin now. EKG SR with PACs no acute changes.       Past Medical History  Diagnosis Date  . CAD S/P percutaneous coronary angioplasty 07/2010; 07/2012; 08/2014    1) 2/'11: MI - 100% RCA - PCI (3 Taxus ION DES 3.0 x 28, 3.0 x 24 & 3.5 x 12 distal-prox); b) ISR of RCA stent -AnngioSculpt PTCA; c) NSTEMI 1/'16: mild ISR with Thrombosis of RCA Stents (in setting of Mesenteric Vein Thrombosis) - Cutting Balloon PTCA  . NSTEMI (non-ST elevated myocardial infarction) 07/2010, 08/2014    And unstable angina in February 20 13th; Echo 1/12/'16: Moderate concentric LVH. EF 60-65% with no regional WMA. Gr 1 DD, otherwise normal  . Stroke 2011; 2013    S/P cardiac cath - embolic stroke; denies residual (07/06/2012)  . 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 04/19/2013    No evidence of CVA on MRI/MRA & Dliated Eye Examination. ruptured blood vessel & not occlusion.  . Coronary stent thrombosis 08/15/2014  . Hyperlipidemia with target LDL less than 70 09/01/2014  . Mesenteric vein thrombosis   . Essential hypertension 07/07/2012  . Migraines     "maybe one/yr" (07/06/2012)    Past Surgical History  Procedure Laterality Date  . Septoplasty  2001  . Coronary angioplasty with stent placement  07/2010  inferior wall MI - 3 Taxus Ion DES (3.0x45m, 3.0x244m 3.5x1227mto prox and mid RCA  . Coronary angioplasty  07/06/2012; 08/15/2014    a) 12/'13: PTCA of 80% ISR mRCA - AngioSculpt; b) PTCA of  ISR/thrombosis  . Cardiac catheterization  04/18/2013  . Left heart catheterization with coronary angiogram N/A 06/11/2012    Procedure: LEFT HEART CATHETERIZATION WITH CORONARY ANGIOGRAM;  Surgeon: MihSanda KleinD;  [facility];  Service: Cardiovascular;  Laterality: N/A;  . Percutaneous coronary stent intervention (pci-s) N/A 07/06/2012    Procedure: PERCUTANEOUS CORONARY STENT INTERVENTION  (PCI-S);  Surgeon: JonLorretta HarpD;  [facility];  Service: Cardiovascular;  Laterality: N/A;  . Left heart catheterization with coronary angiogram N/A 04/18/2013    Procedure: LEFT HEART CATHETERIZATION WITH CORONARY ANGIOGRAM;  Surgeon: ThoTroy SineD;  [facility];  Service: Cardiovascular;  Laterality: N/A;  . Left heart catheterization with coronary angiogram N/A 08/15/2014    Procedure: LEFT HEART CATHETERIZATION WITH CORONARY ANGIOGRAM;  Surgeon: DavLeonie ManD;  [facility];  Service: Cardiovascular;  Laterality: N/A;  . Laparoscopic appendectomy N/A 08/28/2014    Procedure: APPENDECTOMY LAPAROSCOPIC;  Surgeon: DouCoralie KeensD;  [facility];  Laterality: N/A;  . Bowel resection N/A 08/28/2014    Procedure: SMALL BOWEL RESECTION;  Surgeon: DouCoralie KeensD;  [facility];  Laterality: N/A;  . Laparoscopy  08/28/2014    Procedure: LAPAROSCOPY DIAGNOSTIC;  Surgeon: DouCoralie KeensD;  [facility]  . Transthoracic echocardiogram  08/15/2014    Moderate concentric LVH. EF 60-65% with no regional WMA. Gr 1 DD, otherwise normal    Family History  Problem Relation Age of Onset  . Atrial fibrillation Mother   . Mitral valve prolapse Mother   . Coronary artery disease Father     CABG, aortic aneursym,   Social History:  reports that he quit smoking about 4 years ago. His smoking use included Cigarettes. He has a 15 pack-year smoking history. He quit smokeless tobacco use about 4 years ago. His smokeless tobacco use included Chew. He reports that he drinks about 16.8 oz of alcohol per week. He reports that he does not use illicit drugs.  Allergies:  Allergies  Allergen Reactions  . Ace Inhibitors Cough  . Lipitor [Atorvastatin] Other (See Comments)    myalgia  . Crestor [Rosuvastatin] Other (See Comments)  . Zofran [Ondansetron Hcl] Nausea And Vomiting  . Codeine  Itching    OUTPATIENT MEDICATIONS: No current facility-administered medications on file prior to encounter.   Current Outpatient Prescriptions on File Prior to Encounter  Medication Sig Dispense Refill  . allopurinol (ZYLOPRIM) 300 MG tablet Take 300 mg by mouth daily.   4  . aspirin 81 MG chewable tablet Chew 1 tablet (81 mg total) by mouth daily.    . clopidogrel (PLAVIX) 75 MG tablet Take 1 tablet (75 mg total) by mouth daily with breakfast. 90 tablet 1  . dabigatran (PRADAXA) 150 MG CAPS capsule Take 1 capsule (150 mg total) by mouth 2 (two) times daily. 60 capsule 5  . fenofibrate 160 MG tablet Take 1 tablet (160 mg total) by mouth daily. 30 tablet 11  . furosemide (LASIX) 20 MG tablet Take 20 mg by mouth daily.    . KMarland KitchenILL OIL PO Take 1 capsule by mouth daily.    . metoprolol succinate (TOPROL-XL) 50 MG 24 hr tablet Take 50  mg by mouth daily. Take with or immediately following a meal.    . nitroGLYCERIN (NITROSTAT) 0.4 MG SL tablet Place 1 tablet (0.4 mg total) under the tongue every 5 (five) minutes x 3 doses as needed for chest pain. 25 tablet 2  . olmesartan-hydrochlorothiazide (BENICAR HCT) 40-25 MG per tablet Take 0.5 tablets by mouth daily. 30 tablet 0  . ZETIA 10 MG tablet Take 10 mg by mouth daily.   1  . acetaminophen (TYLENOL) 500 MG tablet Take 1 tablet (500 mg total) by mouth every 4 (four) hours as needed for mild pain, fever or headache. (Patient not taking: Reported on 09/28/2014)    . ferrous sulfate (FERROUSUL) 325 (65 FE) MG tablet Take 1 tablet (325 mg total) by mouth 2 (two) times daily with a meal. (Patient not taking: Reported on 10/08/2014) 60 tablet 0   WAS on full dose Benicar 40/25 mg daily  Until recently stopping. NO Longer on IRON.   Results for orders placed or performed during the hospital encounter of 10/08/14 (from the past 48 hour(s))  CBC     Status: Abnormal   Collection Time: 10/08/14 11:15 AM  Result Value Ref Range   WBC 4.5 4.0 - 10.5 K/uL    RBC 4.34 4.22 - 5.81 MIL/uL   Hemoglobin 11.7 (L) 13.0 - 17.0 g/dL   HCT 36.2 (L) 39.0 - 52.0 %   MCV 83.4 78.0 - 100.0 fL   MCH 27.0 26.0 - 34.0 pg   MCHC 32.3 30.0 - 36.0 g/dL   RDW 13.8 11.5 - 15.5 %   Platelets 287 150 - 400 K/uL  Basic metabolic panel     Status: Abnormal   Collection Time: 10/08/14 11:15 AM  Result Value Ref Range   Sodium 138 135 - 145 mmol/L   Potassium 4.0 3.5 - 5.1 mmol/L   Chloride 105 96 - 112 mmol/L   CO2 28 19 - 32 mmol/L   Glucose, Bld 121 (H) 70 - 99 mg/dL   BUN 14 6 - 23 mg/dL   Creatinine, Ser 1.15 0.50 - 1.35 mg/dL   Calcium 9.0 8.4 - 10.5 mg/dL   GFR calc non Af Amer 73 (L) >90 mL/min   GFR calc Af Amer 84 (L) >90 mL/min    Comment: (NOTE) The eGFR has been calculated using the CKD EPI equation. This calculation has not been validated in all clinical situations. eGFR's persistently <90 mL/min signify possible Chronic Kidney Disease.    Anion gap 5 5 - 15  I-stat troponin, ED (not at [facility])     Status: None   Collection Time: 10/08/14 11:32 AM  Result Value Ref Range   Troponin i, poc 0.00 0.00 - 0.08 ng/mL   Comment 3            Comment: Due to the release kinetics of cTnI, a negative result within the first hours of the onset of symptoms does not rule out myocardial infarction with certainty. If myocardial infarction is still suspected, repeat the test at appropriate intervals.   Brain natriuretic peptide     Status: None   Collection Time: 10/08/14 12:15 PM  Result Value Ref Range   B Natriuretic Peptide 42.6 0.0 - 100.0 pg/mL   Dg Chest Port 1 View  10/08/2014   CLINICAL DATA:  Central chest pressure.  Presyncope.  EXAM: PORTABLE CHEST - 1 VIEW  COMPARISON:  08/11/2014  FINDINGS: Midline trachea. Normal heart size. No pleural effusion or pneumothorax. Apical lordotic positioning.  Clear lungs.  IMPRESSION: No acute cardiopulmonary disease.   Electronically Signed   By: Abigail Miyamoto M.D.   On: 10/08/2014 12:24    ROS: General:no  colds or fevers, no weight changes Skin:no rashes or ulcers HEENT:no blurred vision, no congestion CV:see HPI PUL:see HPI GI:no diarrhea constipation or melena, no indigestion GU:no hematuria, no dysuria MS:no joint pain, no claudication Neuro:no syncope, + lightheadedness several times Endo:no diabetes, no thyroid disease   Blood pressure 101/58, pulse 76, temperature 98.6 F (37 C), temperature source Oral, resp. rate 20, height 6' 2"  (1.88 m), weight 245 lb (111.131 kg), SpO2 96 %. PE: General:Pleasant affect, NAD Skin:Warm and dry, brisk capillary refill HEENT:normocephalic, sclera clear, mucus membranes moist Neck:supple, no JVD, no bruits  Heart:S1S2 RRR without murmur, gallup, rub or click Lungs:clear without rales, rhonchi, or wheezes CBU:LAGT, non tender, incision healing, + BS, do not palpate liver spleen or masses Ext:no lower ext edema, 2+ pedal pulses, 2+ radial pulses Neuro:alert and oriented X 3, MAE, follows commands, + facial symmetry    Assessment/Plan Principal Problem:   Chest pain at rest, EKG without acute changes + PACs. Will repeat troponin.  Active Problems:   Near syncope   CAD S/P PCI with 3 DES stents to RCA and PTCA x2 for ISR and thrombosis    Dyslipidemia, statin intol   Essential hypertension- after stopping benicar/hctz BP was elevated this am   Mesenteric vein thrombosis-s/p bowel resection.    [place] Nurse Practitioner Certified [place] Pager [phone] or after 5pm or weekends call [phone] 10/08/2014, 1:29 PM    The patient was seen and examined, and I agree with the assessment and plan as documented above, with modifications as noted below. Pt with aforementioned cardiovascular history presents to ED with "chest fullness" and near syncopal episode. BP markedly elevated after recently stopping Benicar/HCTZ. Said symptoms very different prior to undergoing coronary PCI. ECG, chest xray, and serum  troponin all normal. Said he has felt his hands, legs, and feet becoming tight since stopping Benicar/HCTZ. Physical exam unremarkable. Pt feels very well and is quite adamant about being discharged. Has a sleep study tonight at [facility] he is eager to undergo. Will arrange for outpatient follow up with Dr. Ellyn Hack for antihypertensive medication optimization.

## 2014-10-08 NOTE — Discharge Instructions (Signed)
Follow up with cardiology outpatient as soon as able. Return if worsening symptoms.    Chest Pain (Nonspecific) It is often hard to give a specific diagnosis for the cause of chest pain. There is always a chance that your pain could be related to something serious, such as a heart attack or a blood clot in the lungs. You need to follow up with your health care provider for further evaluation. CAUSES   Heartburn.  Pneumonia or bronchitis.  Anxiety or stress.  Inflammation around your heart (pericarditis) or lung (pleuritis or pleurisy).  A blood clot in the lung.  A collapsed lung (pneumothorax). It can develop suddenly on its own (spontaneous pneumothorax) or from trauma to the chest.  Shingles infection (herpes zoster virus). The chest wall is composed of bones, muscles, and cartilage. Any of these can be the source of the pain.  The bones can be bruised by injury.  The muscles or cartilage can be strained by coughing or overwork.  The cartilage can be affected by inflammation and become sore (costochondritis). DIAGNOSIS  Lab tests or other studies may be needed to find the cause of your pain. Your health care provider may have you take a test called an ambulatory electrocardiogram (ECG). An ECG records your heartbeat patterns over a 24-hour period. You may also have other tests, such as:  Transthoracic echocardiogram (TTE). During echocardiography, sound waves are used to evaluate how blood flows through your heart.  Transesophageal echocardiogram (TEE).  Cardiac monitoring. This allows your health care provider to monitor your heart rate and rhythm in real time.  Holter monitor. This is a portable device that records your heartbeat and can help diagnose heart arrhythmias. It allows your health care provider to track your heart activity for several days, if needed.  Stress tests by exercise or by giving medicine that makes the heart beat faster. TREATMENT   Treatment  depends on what may be causing your chest pain. Treatment may include:  Acid blockers for heartburn.  Anti-inflammatory medicine.  Pain medicine for inflammatory conditions.  Antibiotics if an infection is present.  You may be advised to change lifestyle habits. This includes stopping smoking and avoiding alcohol, caffeine, and chocolate.  You may be advised to keep your head raised (elevated) when sleeping. This reduces the chance of acid going backward from your stomach into your esophagus. Most of the time, nonspecific chest pain will improve within 2-3 days with rest and mild pain medicine.  HOME CARE INSTRUCTIONS   If antibiotics were prescribed, take them as directed. Finish them even if you start to feel better.  For the next few days, avoid physical activities that bring on chest pain. Continue physical activities as directed.  Do not use any tobacco products, including cigarettes, chewing tobacco, or electronic cigarettes.  Avoid drinking alcohol.  Only take medicine as directed by your health care provider.  Follow your health care provider's suggestions for further testing if your chest pain does not go away.  Keep any follow-up appointments you made. If you do not go to an appointment, you could develop lasting (chronic) problems with pain. If there is any problem keeping an appointment, call to reschedule. SEEK MEDICAL CARE IF:   Your chest pain does not go away, even after treatment.  You have a rash with blisters on your chest.  You have a fever. SEEK IMMEDIATE MEDICAL CARE IF:   You have increased chest pain or pain that spreads to your arm, neck, jaw, back, or  abdomen.  You have shortness of breath.  You have an increasing cough, or you cough up blood.  You have severe back or abdominal pain.  You feel nauseous or vomit.  You have severe weakness.  You faint.  You have chills. This is an emergency. Do not wait to see if the pain will go away. Get  medical help at once. Call your local emergency services (911 in [place] Do not drive yourself to the hospital. MAKE SURE YOU:   Understand these instructions.  Will watch your condition.  Will get help right away if you are not doing well or get worse. Document Released: 04/30/2005 Document Revised: 07/26/2013 Document Reviewed: 02/24/2008 [facility] Patient Information 2015 [place], [place]. This information is not intended to replace advice given to you by your health care provider. Make sure you discuss any questions you have with your health care provider.

## 2014-10-10 ENCOUNTER — Encounter: Payer: Self-pay | Admitting: Cardiology

## 2014-10-12 ENCOUNTER — Encounter: Payer: Self-pay | Admitting: *Deleted

## 2014-10-12 NOTE — Progress Notes (Signed)
Received note from [place]. They received referral for pt and have ranked pt as priority 4 on scale 1 to 4 (1 is most urgent). It may take as long as 2 to 3 months for a scheduler to call pt for appt.Marland Kitchen

## 2014-10-13 ENCOUNTER — Other Ambulatory Visit: Payer: Self-pay

## 2014-10-14 DIAGNOSIS — G4733 Obstructive sleep apnea (adult) (pediatric): Secondary | ICD-10-CM

## 2014-10-14 NOTE — Sleep Study (Signed)
   NAME: Dylan [name] DATE OF BIRTH:  Mar 01, 1966 MEDICAL RECORD NUMBER [id number]  LOCATION: [facility]  PHYSICIAN: YOUNG,CLINTON D  DATE OF STUDY: 10/08/2014  SLEEP STUDY TYPE: Nocturnal Polysomnogram               REFERRING PHYSICIAN: [name], PA  INDICATION FOR STUDY: Hypersomnia with sleep apnea  EPWORTH SLEEPINESS SCORE:   6/24 HEIGHT: 6\' 2"  (188 cm)  WEIGHT: 245 lb (111.131 kg)    Body mass index is 31.44 kg/(m^2).  NECK SIZE: 17.5 in.  MEDICATIONS: Charted for review  SLEEP ARCHITECTURE: Total sleep time 306 minutes with sleep efficiency 81.5%. Stage I was 7.7%, stage II 68.3%, stage III absent, REM 24% of total sleep time. Sleep latency 61.5 minutes, REM latency 46.5 minutes, awake after sleep onset 8 minutes, arousal index 2.9, bedtime medication: None  RESPIRATORY DATA: Apnea hypopnea index (AHI) 1.4 per hour. 7 total events scored including 1 central apnea and 6 hypopneas. Non-positional events. REM AHI 2.4 per hour.  OXYGEN DATA: Mild snoring with oxygen desaturation to a nadir of 90% and mean saturation 94.2% on room air  CARDIAC DATA: Sinus rhythm with frequent PACs  MOVEMENT/PARASOMNIA: No significant movement disturbance, no bathroom trips  IMPRESSION/ RECOMMENDATION:   1) Occasional respiratory event with sleep disturbance, within normal limits. AHI 1.4 per hour. The normal range for adults is an AHI from 0-5 events per hour. Mild snoring with oxygen desaturation to a nadir of 90% and mean saturation 94.2% on room air 2) Sleep architecture was unremarkable without bedtime medication.   Deneise Lever Diplomate, American Board of Sleep Medicine  ELECTRONICALLY SIGNED ON:  10/14/2014, 9:49 AM [place] PH: [phone]   FX: [phone] [facility]

## 2014-10-16 ENCOUNTER — Telehealth: Payer: Self-pay | Admitting: Cardiology

## 2014-10-16 NOTE — Telephone Encounter (Signed)
Closed encounter °

## 2014-10-18 ENCOUNTER — Ambulatory Visit
Admission: RE | Admit: 2014-10-18 | Discharge: 2014-10-18 | Disposition: A | Discharge status: Home / Self Care | Payer: BLUE CROSS/BLUE SHIELD | Source: Ambulatory Visit | Attending: Physician Assistant | Admitting: Physician Assistant

## 2014-10-18 DIAGNOSIS — I700 Atherosclerosis of aorta: Secondary | ICD-10-CM

## 2014-10-18 DIAGNOSIS — I159 Secondary hypertension, unspecified: Secondary | ICD-10-CM

## 2014-10-20 ENCOUNTER — Ambulatory Visit (INDEPENDENT_AMBULATORY_CARE_PROVIDER_SITE_OTHER): Payer: BLUE CROSS/BLUE SHIELD | Admitting: Pharmacist Clinician (PhC)/ Clinical Pharmacy Specialist

## 2014-10-20 VITALS — BP 110/86 | HR 88

## 2014-10-20 DIAGNOSIS — I10 Essential (primary) hypertension: Secondary | ICD-10-CM

## 2014-10-20 LAB — BASIC METABOLIC PANEL
BUN: 25 mg/dL — ABNORMAL HIGH (ref 6–23)
CHLORIDE: 99 meq/L (ref 96–112)
CO2: 27 meq/L (ref 19–32)
Calcium: 10 mg/dL (ref 8.4–10.5)
Creat: 1.32 mg/dL (ref 0.50–1.35)
Glucose, Bld: 103 mg/dL — ABNORMAL HIGH (ref 70–99)
Potassium: 4.1 mEq/L (ref 3.5–5.3)
Sodium: 138 mEq/L (ref 135–145)

## 2014-10-20 NOTE — Patient Instructions (Signed)
Your blood pressure today is 110/86   Check your blood pressure at home daily (if able) and keep record of the readings.  Take your BP meds as follows: continue with all current medications  Bring all of your meds, your BP cuff and your record of home blood pressures to your next appointment.  Exercise as you're able, try to walk approximately 30 minutes per day.  Keep salt intake to a minimum, especially watch canned and prepared boxed foods.  Eat more fresh fruits and vegetables and fewer canned items.  Avoid eating in fast food restaurants.    HOW TO TAKE YOUR BLOOD PRESSURE: . Rest 5 minutes before taking your blood pressure. .  Don't smoke or drink caffeinated beverages for at least 30 minutes before. . Take your blood pressure before (not after) you eat. . Sit comfortably with your back supported and both feet on the floor (don't cross your legs). . Elevate your arm to heart level on a table or a desk. . Use the proper sized cuff. It should fit smoothly and snugly around your bare upper arm. There should be enough room to slip a fingertip under the cuff. The bottom edge of the cuff should be 1 inch above the crease of the elbow. . Ideally, take 3 measurements at one sitting and record the average.

## 2014-10-22 ENCOUNTER — Encounter: Payer: Self-pay | Admitting: Pharmacist Clinician (PhC)/ Clinical Pharmacy Specialist

## 2014-10-22 NOTE — Assessment & Plan Note (Addendum)
Today Dylan [name] BP has returned to normal.  I will repeat a BMET today to ensure that his serum creatinine has not increased by more than 30%.  He is to continue with all of his current medications.

## 2014-10-22 NOTE — Progress Notes (Signed)
10/22/2014 Dylan [name] 09-01-1965 [id number]   HPI:  Dylan [name] is a 49 y.o. male patient of Dr Ellyn Hack, with a PMH below who presents today for hypertension clinic evaluation.  He takes Benicar HCT 40/25, metoprolol succinate 50 mg both once daily.  He stopped his meds for 4 days because of an increase in SCR and ended up in the ER with a BP of 1766/111.  He restarted his medications that night and has been feeling fine since then   Cardiac Hx: 2011 nonSTEMI with multiple stents, 2013 angina with in-stent restenosis, 2016 mesenteric vein thrombosis, mild nonSTEMI with in stent thrombosis  Family Hx: AF in mother, CAD in father  Social Hx: quit tobacco in 2011, some alcohol  Exercise: lives on campus of [place], walks 2.5 + miles per day; hopes to start working in gym soon  Current antihypertensive medications: Benicar HCT 40/25, metoprolol succinate 50 mg  Both daily   Current Outpatient Prescriptions  Medication Sig Dispense Refill  . acetaminophen (TYLENOL) 500 MG tablet Take 1 tablet (500 mg total) by mouth every 4 (four) hours as needed for mild pain, fever or headache. (Patient not taking: Reported on 09/28/2014)    . allopurinol (ZYLOPRIM) 300 MG tablet Take 300 mg by mouth daily.   4  . aspirin 81 MG chewable tablet Chew 1 tablet (81 mg total) by mouth daily.    . clopidogrel (PLAVIX) 75 MG tablet Take 1 tablet (75 mg total) by mouth daily with breakfast. 90 tablet 1  . dabigatran (PRADAXA) 150 MG CAPS capsule Take 1 capsule (150 mg total) by mouth 2 (two) times daily. 60 capsule 5  . fenofibrate 160 MG tablet Take 1 tablet (160 mg total) by mouth daily. 30 tablet 11  . furosemide (LASIX) 20 MG tablet Take 20 mg by mouth daily.    Marland Kitchen KRILL OIL PO Take 1 capsule by mouth daily.    . metoprolol succinate (TOPROL-XL) 50 MG 24 hr tablet Take 50 mg by mouth daily. Take with or immediately following a meal.    . nitroGLYCERIN (NITROSTAT) 0.4 MG SL tablet Place  1 tablet (0.4 mg total) under the tongue every 5 (five) minutes x 3 doses as needed for chest pain. 25 tablet 2  . olmesartan-hydrochlorothiazide (BENICAR HCT) 40-25 MG per tablet Take 0.5 tablets by mouth daily. 30 tablet 0  . ZETIA 10 MG tablet Take 10 mg by mouth daily.   1   No current facility-administered medications for this visit.    Allergies  Allergen Reactions  . Ace Inhibitors Cough  . Lipitor [Atorvastatin] Other (See Comments)    myalgia  . Crestor [Rosuvastatin] Other (See Comments)  . Zofran [Ondansetron Hcl] Nausea And Vomiting  . Codeine Itching    Past Medical History  Diagnosis Date  . CAD S/P percutaneous coronary angioplasty 07/2010; 07/2012; 08/2014    1) 2/'11: MI - 100% RCA - PCI (3 Taxus ION DES 3.0 x 28, 3.0 x 24 & 3.5 x 12 distal-prox); b) ISR of RCA stent -AnngioSculpt PTCA; c) NSTEMI 1/'16: mild ISR with Thrombosis of RCA Stents (in setting of Mesenteric Vein Thrombosis) - Cutting Balloon PTCA  . NSTEMI (non-ST elevated myocardial infarction) 07/2010, 08/2014    And unstable angina in February 20 13th; Echo 1/12/'16: Moderate concentric LVH. EF 60-65% with no regional WMA. Gr 1 DD, otherwise normal  . Stroke 2011; 2013    S/P cardiac cath - embolic stroke; denies residual (07/06/2012)  .  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 04/19/2013    No evidence of CVA on MRI/MRA & Dliated Eye Examination. ruptured blood vessel & not occlusion.  . Coronary stent thrombosis 08/15/2014  . Hyperlipidemia with target LDL less than 70 09/01/2014  . Mesenteric vein thrombosis   . Essential hypertension 07/07/2012  . Migraines     "maybe one/yr" (07/06/2012)    Blood pressure 110/86, pulse 88.    [name] PharmD CPP [facility]

## 2014-10-24 ENCOUNTER — Telehealth: Payer: Self-pay | Admitting: *Deleted

## 2014-10-24 NOTE — Telephone Encounter (Signed)
Spoke to patient. Result given . Verbalized understanding  Per Dr Ellyn Hack, no other labs needed at present time.

## 2014-10-24 NOTE — Telephone Encounter (Signed)
-----   Message from [name], MD sent at 10/23/2014  5:57 PM EDT ----- Renal function is pretty stable. 1.15 to 1.32 is relatively within realm of lab error.  [place]

## 2014-11-02 ENCOUNTER — Telehealth: Payer: Self-pay | Admitting: Cardiology

## 2014-11-02 NOTE — Telephone Encounter (Signed)
Got call from [facility] [place] regarding needing a patient signature for a PA appeal. She requested I reach out to patient to see if sig could be obtained.  Informed her I would f/u w/ patient.

## 2014-11-02 NOTE — Telephone Encounter (Signed)
Pt contacted, he apparently met w/ Erasmo Downer & the repatha forms were signed at that time. Routing call to [place] to verify.

## 2014-11-06 ENCOUNTER — Other Ambulatory Visit: Payer: Self-pay | Admitting: Pharmacist Clinician (PhC)/ Clinical Pharmacy Specialist

## 2014-11-06 MED ORDER — OLMESARTAN MEDOXOMIL-HCTZ 40-25 MG PO TABS: 1.0000 | ORAL_TABLET | Freq: Every day | ORAL | Status: DC

## 2014-11-06 NOTE — Telephone Encounter (Signed)
Spoke to [place], she will refax paperwork to repatha.

## 2014-11-26 ENCOUNTER — Encounter (HOSPITAL_BASED_OUTPATIENT_CLINIC_OR_DEPARTMENT_OTHER): Payer: BLUE CROSS/BLUE SHIELD

## 2014-12-03 ENCOUNTER — Other Ambulatory Visit: Payer: Self-pay | Admitting: Cardiology

## 2014-12-05 ENCOUNTER — Telehealth: Payer: Self-pay | Admitting: Cardiology

## 2014-12-05 NOTE — Telephone Encounter (Signed)
Left message on voice mail  to call back

## 2014-12-05 NOTE — Telephone Encounter (Signed)
Patient states he went to [facility] last week - on the plane had a little pressure in his chest. While there nausea ,dizziness,little pressure. Patient states since being back difficult going up stairs at work , little shortness of breathe,feeling PAC'S blood pressure, 100/68 Appointment schedule for 12/07/14 3:15 pm. Informed patient any worsening sx go to CALL 911 GO TO ER . Patient verbalized understanding.

## 2014-12-05 NOTE — Telephone Encounter (Signed)
Alda Berthold is calling to speak with a nurse in regards to this medication

## 2014-12-05 NOTE — Telephone Encounter (Signed)
Please call,says he is feeling weird. His heart is fluttering and a little short of breath.

## 2014-12-05 NOTE — Telephone Encounter (Signed)
Spoke with Repatha insurance people - they will re-submit to insurance this week

## 2014-12-07 ENCOUNTER — Ambulatory Visit (INDEPENDENT_AMBULATORY_CARE_PROVIDER_SITE_OTHER): Payer: BLUE CROSS/BLUE SHIELD | Admitting: Cardiology

## 2014-12-07 ENCOUNTER — Encounter: Payer: Self-pay | Admitting: Cardiology

## 2014-12-07 VITALS — BP 100/70 | HR 81 | Ht 74.0 in | Wt 265.7 lb

## 2014-12-07 DIAGNOSIS — E785 Hyperlipidemia, unspecified: Secondary | ICD-10-CM

## 2014-12-07 DIAGNOSIS — E669 Obesity, unspecified: Secondary | ICD-10-CM

## 2014-12-07 DIAGNOSIS — R42 Dizziness and giddiness: Secondary | ICD-10-CM

## 2014-12-07 DIAGNOSIS — Z9861 Coronary angioplasty status: Secondary | ICD-10-CM

## 2014-12-07 DIAGNOSIS — I2510 Atherosclerotic heart disease of native coronary artery without angina pectoris: Secondary | ICD-10-CM

## 2014-12-07 DIAGNOSIS — R079 Chest pain, unspecified: Secondary | ICD-10-CM | POA: Diagnosis not present

## 2014-12-07 DIAGNOSIS — I10 Essential (primary) hypertension: Secondary | ICD-10-CM | POA: Diagnosis not present

## 2014-12-07 DIAGNOSIS — IMO0002 Reserved for concepts with insufficient information to code with codable children: Secondary | ICD-10-CM

## 2014-12-07 DIAGNOSIS — K550 Acute vascular disorders of intestine: Secondary | ICD-10-CM

## 2014-12-07 DIAGNOSIS — I214 Non-ST elevation (NSTEMI) myocardial infarction: Secondary | ICD-10-CM

## 2014-12-07 DIAGNOSIS — K55069 Acute infarction of intestine, part and extent unspecified: Secondary | ICD-10-CM

## 2014-12-07 DIAGNOSIS — T82857S Stenosis of cardiac prosthetic devices, implants and grafts, sequela: Secondary | ICD-10-CM | POA: Diagnosis not present

## 2014-12-07 DIAGNOSIS — T82867S Thrombosis of cardiac prosthetic devices, implants and grafts, sequela: Secondary | ICD-10-CM

## 2014-12-07 DIAGNOSIS — Z79899 Other long term (current) drug therapy: Secondary | ICD-10-CM

## 2014-12-07 NOTE — Patient Instructions (Signed)
STOP  ASPIRIN  STAY ON PLAVIX  LABS LIPID, CMP - NOTHING TO EAT OR DRINK  DECREASE BENICAR TO 20 MG (1/2 TABLET DAILY)   Your physician wants you to follow-up in [place]ARD[place].  You will receive a reminder letter in the mail two months in advance. If you don't receive a letter, please call our office to schedule the follow-up appointment.

## 2014-12-07 NOTE — Progress Notes (Signed)
PATIENT: Dylan [name] MRN: [id number] DOB: 12/06/1965 PCP: [name], MD  Clinic Note: Chief Complaint  Patient presents with  . Follow-up    triage, chest pressure dizziness, heart racing  . Coronary Artery Disease    HPI: Dylan [name] is a 49 y.o. male with a PMH below who presents today for hospital followup and to reestablish cardiology care. He was initially a patient of Dr. Dani Gobble Croitoru who presented with a non-STEMI in November of 2001.  He has a history of hypertension, dyslipidemia as well as being a former smoker. After December 2013 where he had PTCA to in-stent restenosis, he was lost to followup.     Cardiac History:  November 2011: non- STEMI: 100% occluded RCA after conus branch --> Following initial angioplasty there was a long area of disease that required extensive PCI with multiple drug-eluting stents Taxs ION  3.0-3.5 mm stents).  December 2013: Unstable Angina --> Focal 75% ISR in mid RCA stent overlapped segment --> Angiosculpt PTCA  January 2016: Shortly after starting testosterone injections he presented with mesenteric vein thrombosis treated with thrombectomy. This was complicated by mild non-STEMI --> Was found to have diffuse in-stent thrombosis with no significant stenosis. PTCA was performed with disruption of thrombosis, but with residual thrombus noted.  After the initial hospitalization with mesenteric vein thrombectomy and RCA PTCA, he then presented again with acute abdominal pain was found to have a perforated small bowel that failed initial medical management. He subsequently underwent exploratory laparoscopy followed by partial bowel resection. He was maintained on IV heparin in the perioperative period and switched back to Pradaxa plus aspirin and Plavix. He just had his staples taken out and has mild oozing at the incision site.   I last saw him in February 2016 -he was referred to [name], RPH-CCP to establish his lipid  management plan. She has reviewed his chart and has found that prior to being switched to pravastatin in August through September 2014, he had been on simvastatin and atorvastatin for brief intervals. He then switched to pravastatin and still had severe myalgias. Erasmo Downer had him try Crestor 5 mg per week for roughly 3 weeks in February of this year and he still had significant arthralgias and myalgias. He is now on Zetia 10 mg and fenofibrate 160 mg.. Apparently his PCP at actually started him on Repatha in January timeframe, therefore the labs checked at that interval were probably inaccurate. The plan is now to obtain a new set of labs this week in order to determine if he qualifies for initiation of Repatha.   Interval History: For the most part, he has been doing relatively well since I last saw him. He has had about 3 episodes where his heart is racing. It lasts only a few minutes and no rhyme or reason Occurs. Is not happening when he exerts himself. It usually is at night when he lies down. Lines he has had some discomfort across the chest wall underneath his breast and on the top portion of his chest across the upper ribs. His previous anginal equivalent with his MI was jaw pain and sweating associated with nausea and dyspnea. He has not had any of those symptoms with either rest or exertion. He has not really been exerting himself very much, so I cannot comment too much on exertional dyspnea. He does not have any resting dyspnea, PND or orthopnea. Minimal edema taking 20 mg of Lasix. He does note palpitations every once in  a while but had those 3 spells of rapid heartbeats. He did not describe as being an irregular rapid heartbeat, and his was not associated with lightheadedness, dizziness or syncope/near syncope. He has not had other episodes of those symptoms. He denies any TIA/amaurosis fugax symptoms.  He is currently on triple therapy with aspirin/Plavix/Pradaxa. He has not had any melena,  hematochezia, hematuria, or epistaxis. He has however noted pretty significant bruising.  Past Medical History  Diagnosis Date  . CAD S/P percutaneous coronary angioplasty 07/2010; 07/2012; 08/2014    1) 2/'11: MI - 100% RCA - PCI (3 Taxus ION DES 3.0 x 28, 3.0 x 24 & 3.5 x 12 distal-prox); b) ISR of RCA stent -AnngioSculpt PTCA; c) NSTEMI 1/'16: mild ISR with Thrombosis of RCA Stents (in setting of Mesenteric Vein Thrombosis) - Cutting Balloon PTCA  . NSTEMI (non-ST elevated myocardial infarction) 07/2010, 08/2014    And unstable angina in February 20 13th; Echo 1/12/'16: Moderate concentric LVH. EF 60-65% with no regional WMA. Gr 1 DD, otherwise normal  . Stroke 2011; 2013    S/P cardiac cath - embolic stroke; denies residual (07/06/2012)  . 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 04/19/2013    No evidence of CVA on MRI/MRA & Dliated Eye Examination. ruptured blood vessel & not occlusion.  . Coronary stent thrombosis 08/15/2014  . Hyperlipidemia with target LDL less than 70 09/01/2014  . Mesenteric vein thrombosis   . Essential hypertension 07/07/2012  . Migraines     "maybe one/yr" (07/06/2012)    Prior Cardiac Evaluation and Past Surgical History: Past Surgical History  Procedure Laterality Date  . Septoplasty  2001  . Coronary angioplasty with stent placement  07/2010    inferior wall MI - 3 Taxus Ion DES (3.0x19mm, 3.0x28mm, 3.5x32mm) to prox and mid RCA  . Coronary angioplasty  07/06/2012; 08/15/2014    a) 12/'13: PTCA of 80% ISR mRCA - AngioSculpt; b) PTCA of  ISR/thrombosis  . Cardiac catheterization  04/18/2013  . Left heart catheterization with coronary angiogram N/A 06/11/2012    Procedure: LEFT HEART CATHETERIZATION WITH CORONARY ANGIOGRAM;  Surgeon: [name], MD;  Location: [facility] CATH LAB;  Service: Cardiovascular;  Laterality: N/A;  . Percutaneous coronary stent intervention  (pci-s) N/A 07/06/2012    Procedure: PERCUTANEOUS CORONARY STENT INTERVENTION (PCI-S);  Surgeon: [name], MD;  Location: [facility] CATH LAB;  Service: Cardiovascular;  Laterality: N/A;  . Left heart catheterization with coronary angiogram N/A 04/18/2013    Procedure: LEFT HEART CATHETERIZATION WITH CORONARY ANGIOGRAM;  Surgeon: [name], MD;  Location: [facility] CATH LAB;  Service: Cardiovascular;  Laterality: N/A;  . Left heart catheterization with coronary angiogram N/A 08/15/2014    Procedure: LEFT HEART CATHETERIZATION WITH CORONARY ANGIOGRAM;  Surgeon: [name], MD;  Location: [facility] CATH LAB;  Service: Cardiovascular;  Laterality: N/A;  . Laparoscopic appendectomy N/A 08/28/2014    Procedure: APPENDECTOMY LAPAROSCOPIC;  Surgeon: [name], MD;  Location: [facility];  Service: General;  Laterality: N/A;  . Bowel resection N/A 08/28/2014    Procedure: SMALL BOWEL RESECTION;  Surgeon: [name], MD;  Location: [facility];  Service: General;  Laterality: N/A;  . Laparoscopy  08/28/2014    Procedure: LAPAROSCOPY DIAGNOSTIC;  Surgeon: [name], MD;  Location: [facility];  Service: General;;  . Transthoracic echocardiogram  08/15/2014    Moderate concentric LVH. EF 60-65% with no regional WMA.  Gr 1 DD, otherwise normal    Allergies  Allergen Reactions  . Ace Inhibitors Cough  . Lipitor [Atorvastatin] Other (See Comments)    myalgia  . Crestor [Rosuvastatin] Other (See Comments)  . Zofran [Ondansetron Hcl] Nausea And Vomiting  . Codeine Itching    Current Outpatient Prescriptions  Medication Sig Dispense Refill  . acetaminophen (TYLENOL) 500 MG tablet Take 1 tablet (500 mg total) by mouth every 4 (four) hours as needed for mild pain, fever or headache.    Marland Kitchen BENICAR HCT 40-25 MG per tablet TAKE 1 TABLET BY MOUTH DAILY 30 tablet 9  . clopidogrel (PLAVIX) 75 MG tablet Take 1 tablet (75 mg total) by mouth daily with breakfast. 90 tablet 1  . dabigatran (PRADAXA) 150 MG CAPS capsule Take  1 capsule (150 mg total) by mouth 2 (two) times daily. 60 capsule 5  . fenofibrate 160 MG tablet Take 1 tablet (160 mg total) by mouth daily. 30 tablet 11  . furosemide (LASIX) 20 MG tablet Take 20 mg by mouth daily.    Marland Kitchen KRILL OIL PO Take 1 capsule by mouth daily.    . metoprolol succinate (TOPROL-XL) 50 MG 24 hr tablet Take 50 mg by mouth daily. Take with or immediately following a meal.    . nitroGLYCERIN (NITROSTAT) 0.4 MG SL tablet Place 1 tablet (0.4 mg total) under the tongue every 5 (five) minutes x 3 doses as needed for chest pain. 25 tablet 2  . ZETIA 10 MG tablet Take 10 mg by mouth daily.   1   No current facility-administered medications for this visit.    History   Social History  . Marital Status: Married    Spouse Name: N/A  . Number of Children: 2  . Years of Education: 12   Occupational History  . English as a second language teacher   Social History Main Topics  . Smoking status: Former Smoker -- 1.00 packs/day for 15 years    Types: Cigarettes    Quit date: 06/30/2010  . Smokeless tobacco: Former Systems developer    Types: [place] date: 06/30/2010  . Alcohol Use: 16.8 oz/week    28 Shots of liquor per week     Comment: 04/18/2013 "2 drinks/ day w/maybe 2 shots in each drink"  . Drug Use: No  . Sexual Activity: Yes   Other Topics Concern  . None   Social History Narrative    family history includes Atrial fibrillation in his mother; Coronary artery disease in his father; Mitral valve prolapse in his mother.  ROS: A comprehensive Review of Systems - was performed Review of Systems  Constitutional: Negative for malaise/fatigue.  Respiratory:       Some allergy related congestion  Gastrointestinal: Positive for heartburn and abdominal pain (Mild). Negative for blood in stool and melena.  Genitourinary: Negative for hematuria.  Musculoskeletal: Positive for back pain. Negative for myalgias.  Neurological: Positive for dizziness (Positional dizziness with moving his head  in certain directions. Vertigo type symptoms).  Endo/Heme/Allergies: Negative.   Psychiatric/Behavioral: Negative.   All other systems reviewed and are negative.  Wt Readings from Last 3 Encounters:  12/07/14 120.521 kg (265 lb 11.2 oz)  10/08/14 111.131 kg (245 lb)  10/08/14 111.131 kg (245 lb)    PHYSICAL EXAM BP 100/70 mmHg  Pulse 81  Ht 6\' 2"  (1.88 m)  Wt 120.521 kg (265 lb 11.2 oz)  BMI 34.10 kg/m2 General appearance: alert, cooperative, appears stated age, no distress, mildly obese and  Healthy-appearing. HEENT: [place]/AT, EOMI, MMM, anicteric sclera Neck: no adenopathy, no carotid bruit, no JVD and supple, symmetrical, trachea midline Lungs: CTA B, normal percussion bilaterally and Nonlabored, good air movement Heart: RRR with ectopy, S1& S2 normal, no murmur, click, rub or gallop and normal apical impulse Abdomen: Soft, appropriately tender around the incision site. No HSM. He does have some mild serosanguineous drainage from the incision site following suture removal. Mild staining on the gauze but not significant amount of blood. Extremities: extremities normal, atraumatic, no cyanosis or edema Pulses: 2+ and symmetric Neurologic: Alert and oriented X 3, normal strength and tone. Normal symmetric reflexes. Normal coordination and gait   Adult ECG Report  Rate: 81;  Rhythm: normal sinus rhythm, premature atrial contractions (PAC) and Nonspecific T wave inversions in lead 3.   Narrative Interpretation: essentially normal EKG with exception of PACs and nonspecific T-wave changes. Normal axis, intervals and durations.   Recent Labs:  Lab Results  Component Value Date   CHOL 167 09/19/2014   HDL 36* 09/19/2014   LDLCALC 101* 09/19/2014   TRIG 150* 09/19/2014   CHOLHDL 4.6 09/19/2014     Chemistry      Component Value Date/Time   NA 138 10/20/2014 1333   NA 140 09/28/2014 1438   K 4.1 10/20/2014 1333   K 4.3 09/28/2014 1438   CL 99 10/20/2014 1333   CO2 27 10/20/2014  1333   CO2 25 09/28/2014 1438   BUN 25* 10/20/2014 1333   BUN 27.4* 10/04/2014 1445   CREATININE 1.32 10/20/2014 1333   CREATININE 1.15 10/08/2014 1115   CREATININE 1.6* 10/04/2014 1445      Component Value Date/Time   CALCIUM 10.0 10/20/2014 1333   CALCIUM 9.5 09/28/2014 1438   ALKPHOS 76 09/19/2014 1101   AST 21 09/19/2014 1101   ALT 22 09/19/2014 1101   BILITOT 0.5 09/19/2014 1101      ASSESSMENT / PLAN: Problem List Items Addressed This Visit    CAD S/P PCI with 3 DES stents to RCA and PTCA x2 for ISR and thrombosis  (Chronic)    For now he is on dual endplate therapy. I think we can stop aspirin, but would continue Plavix. No further anginal episodes. He is having some chest wall discomfort that does not sound cardiac in nature. Continue beta blocker and ARB/HCTZ.      Relevant Orders   EKG 12-Lead (Completed)   Comprehensive metabolic panel   Lipid panel   Coronary stent thrombosis (Chronic)    I only did angioplasty alone during the last intervention. He does have drug-eluting stents with their relatively old. With him being up Pradaxa, I would like to stop the aspirin and discontinue Plavix alone. Once he goes off Pradaxa, he should go back on aspirin.      Dyslipidemia, statin intol (Chronic)    See the notes above. He has been on simvastatin, atorvastatin, pravastatin and rosuvastatin -all with significant limiting myalgias. Even once weekly Crestor/rosuvastatin was intolerable.  He remains on Zetia plus fenofibrate. Plan: Check lipid panel hopefully tomorrow in order to determine what his current status is off of Repatha. He had initially been started on Repatha, but this was not covered by his insurance company when prescribed by his PCP. We are documenting his true statin intolerance and lack of control with non-statin medications.       Relevant Orders   EKG 12-Lead (Completed)   Comprehensive metabolic panel   Lipid panel   Essential hypertension (Chronic)  Blood pressures are a little bit actually low now. His renal function has improved throughout the course of March. He is okay being on the ARB, but with his blood pressure being low I would like to cut the dose in half.      Relevant Orders   EKG 12-Lead (Completed)   Comprehensive metabolic panel   Lipid panel   NSTEMI (non-ST elevated myocardial infarction) - 07/2010, 2013, 08/2014 - Primary (Chronic)   Relevant Orders   EKG 12-Lead (Completed)   Obesity (BMI 30-39.9) (Chronic)    He had lost a lot of weight while he was in the hospital. Unfortunately he has gained about 9 pounds back distant the last 2 months since he has been eating better. I reiterated the need to watch what he is eating and try to make the best of the fact that he lost all that weight and not put back on again. Hopefully can get back into exercising and moderate his diet.      Superior mesenteric vein thrombosis (Chronic)    He is currently on Pradaxa for anti-coagulation. My understanding is that this will be for 6 months. He will hold aspirin for the remainder that timeframe and then restart aspirin to continue aspirin plus Plavix until January of next year. After that I think we can probably switch to Plavix alone. I would also recommend hypercoagulable workup once he is off a Pradaxa.       Other Visit Diagnoses    Dizziness        Relevant Orders    EKG 12-Lead (Completed)    Polypharmacy        Relevant Orders    Comprehensive metabolic panel    Lipid panel      Patient instructions: STOP  ASPIRIN  STAY ON PLAVIX  LABS LIPID, CMP - NOTHING TO EAT OR DRINK  DECREASE BENICAR TO 20 MG (1/2 TABLET DAILY)  Followup: 36months  Nevah Dalal W. Ellyn Hack, M.D., M.S. Interventional Cardiolgy [facility]

## 2014-12-08 ENCOUNTER — Encounter (HOSPITAL_BASED_OUTPATIENT_CLINIC_OR_DEPARTMENT_OTHER): Payer: BLUE CROSS/BLUE SHIELD

## 2014-12-10 ENCOUNTER — Encounter: Payer: Self-pay | Admitting: Cardiology

## 2014-12-10 NOTE — Assessment & Plan Note (Signed)
Blood pressures are a little bit actually low now. His renal function has improved throughout the course of March. He is okay being on the ARB, but with his blood pressure being low I would like to cut the dose in half.

## 2014-12-10 NOTE — Assessment & Plan Note (Signed)
He had lost a lot of weight while he was in the hospital. Unfortunately he has gained about 9 pounds back distant the last 2 months since he has been eating better. I reiterated the need to watch what he is eating and try to make the best of the fact that he lost all that weight and not put back on again. Hopefully can get back into exercising and moderate his diet.

## 2014-12-10 NOTE — Assessment & Plan Note (Signed)
See the notes above. He has been on simvastatin, atorvastatin, pravastatin and rosuvastatin -all with significant limiting myalgias. Even once weekly Crestor/rosuvastatin was intolerable.  He remains on Zetia plus fenofibrate. Plan: Check lipid panel hopefully tomorrow in order to determine what his current status is off of Repatha. He had initially been started on Repatha, but this was not covered by his insurance company when prescribed by his PCP. We are documenting his true statin intolerance and lack of control with non-statin medications.

## 2014-12-10 NOTE — Assessment & Plan Note (Signed)
He is currently on Pradaxa for anti-coagulation. My understanding is that this will be for 6 months. He will hold aspirin for the remainder that timeframe and then restart aspirin to continue aspirin plus Plavix until January of next year. After that I think we can probably switch to Plavix alone. I would also recommend hypercoagulable workup once he is off a Pradaxa.

## 2014-12-10 NOTE — Assessment & Plan Note (Addendum)
I only did angioplasty alone during the last intervention. He does have drug-eluting stents with their relatively old. With him being up Pradaxa, I would like to stop the aspirin and discontinue Plavix alone. Once he goes off Pradaxa, he should go back on aspirin.

## 2014-12-10 NOTE — Assessment & Plan Note (Signed)
For now he is on dual endplate therapy. I think we can stop aspirin, but would continue Plavix. No further anginal episodes. He is having some chest wall discomfort that does not sound cardiac in nature. Continue beta blocker and ARB/HCTZ.

## 2014-12-16 LAB — LIPID PANEL
Cholesterol: 205 mg/dL — ABNORMAL HIGH (ref 0–200)
HDL: 39 mg/dL — ABNORMAL LOW (ref >=40)
LDL Cholesterol: 142 mg/dL — ABNORMAL HIGH (ref 0–99)
TRIGLYCERIDES: 121 mg/dL (ref <150)
Total CHOL/HDL Ratio: 5.3 Ratio
VLDL: 24 mg/dL (ref 0–40)

## 2014-12-16 LAB — COMPREHENSIVE METABOLIC PANEL
ALBUMIN: 4.1 g/dL (ref 3.5–5.2)
ALK PHOS: 41 U/L (ref 39–117)
ALT: 24 U/L (ref 0–53)
AST: 20 U/L (ref 0–37)
BILIRUBIN TOTAL: 0.4 mg/dL (ref 0.2–1.2)
BUN: 35 mg/dL — ABNORMAL HIGH (ref 6–23)
CO2: 28 meq/L (ref 19–32)
Calcium: 9.5 mg/dL (ref 8.4–10.5)
Chloride: 103 mEq/L (ref 96–112)
Creat: 1.39 mg/dL — ABNORMAL HIGH (ref 0.50–1.35)
Glucose, Bld: 113 mg/dL — ABNORMAL HIGH (ref 70–99)
POTASSIUM: 4.6 meq/L (ref 3.5–5.3)
SODIUM: 138 meq/L (ref 135–145)
Total Protein: 6.8 g/dL (ref 6.0–8.3)

## 2014-12-18 ENCOUNTER — Ambulatory Visit: Payer: Self-pay | Admitting: Cardiology

## 2014-12-21 ENCOUNTER — Telehealth: Payer: Self-pay | Admitting: Cardiology

## 2014-12-21 ENCOUNTER — Encounter (HOSPITAL_COMMUNITY): Payer: Self-pay | Admitting: Nurse Practitioner

## 2014-12-21 ENCOUNTER — Other Ambulatory Visit (HOSPITAL_COMMUNITY): Payer: Self-pay | Admitting: *Deleted

## 2014-12-21 ENCOUNTER — Ambulatory Visit (HOSPITAL_COMMUNITY)
Admission: RE | Admit: 2014-12-21 | Discharge: 2014-12-21 | Disposition: A | Discharge status: Home / Self Care | Payer: BLUE CROSS/BLUE SHIELD | Source: Ambulatory Visit | Attending: Nurse Practitioner | Admitting: Nurse Practitioner

## 2014-12-21 VITALS — BP 140/92 | HR 85 | Ht 73.0 in | Wt 267.4 lb

## 2014-12-21 DIAGNOSIS — R002 Palpitations: Secondary | ICD-10-CM | POA: Insufficient documentation

## 2014-12-21 DIAGNOSIS — R42 Dizziness and giddiness: Secondary | ICD-10-CM | POA: Diagnosis not present

## 2014-12-21 MED ORDER — OLMESARTAN MEDOXOMIL 20 MG PO TABS: 20.0000 mg | ORAL_TABLET | Freq: Every day | ORAL | Status: DC

## 2014-12-21 NOTE — Patient Instructions (Addendum)
Your physician has recommended that you wear a holter monitor. Holter monitors are medical devices that record the heart's electrical activity. Doctors most often use these monitors to diagnose arrhythmias. Arrhythmias are problems with the speed or rhythm of the heartbeat. The monitor is a small, portable device. You can wear one while you do your normal daily activities. This is usually used to diagnose what is causing palpitations/syncope (passing out).  Your physician has recommended you make the following change in your medication:  1)Stop Benicar/HCTZ combination drug 2)Start Benicar 20mg  once a day 3)Slow transition toprol to bedtime (still only once a day) 4)Start taking a magnesium over the counter supplement once a day  We will contact you regarding next steps once we have results of monitor.

## 2014-12-21 NOTE — Telephone Encounter (Signed)
Please call asap,having strange feeling.

## 2014-12-21 NOTE — Progress Notes (Signed)
Patient ID: Dylan [name], male   DOB: Jan 01, 1966, 49 y.o.   MRN: [id number]     Primary Care Physician: [name], MD Cardiologist: Dr. Ellyn Hack Referral source: [facility]  Dylan [name] is a 49 y.o. male with a h/o HTN, hyperlipidemia,  CAD, S/p stent of the RCA, mesenteric vein thrombosis treated with thrombectomy and pradaxa(now finished therapy) 1/16, with prolonged hospitalization, in [facility] entire month of January. He lost 50 lbs from acute illness and has gained around 10 lbs back. He was seen by Dr. Ellyn Hack recently and BP med cut in half for some low BP readings. He also started injectable stain med Repatha within the last week. This am around 6:30 am, he got  dizzy and was aware of his heart beating fast. He sat down with his head between his legs and started feeling better in just a few minutes. He was aware of an irregular heart beat as well. Has had PAC's in the past. He called the office and was given an appointment here for evaluation, due to irregular heartbeat, possible afib.   EKG today shows SR with PAC's. He denies any chest pain, dyspnea, still feels slightly dizzy. BP standing 118/80, 140/92 sitting and heart rate increased to 100 from baseline, 85 bpm sitting. He does work in an Compt[employer]. Has been on HCTZ in the past and lasix added in January. Attempts to drink enough H2O. Recent renal panel shows elevation of creatinine at 1.39 and BUN 35. Kt normal at 4.6.   Today, he denies symptoms of  chest pain, shortness of breath, orthopnea, PND, lower extremity edema,  presyncope, syncope, or neurologic sequela, positive for dizziness and palpitations. The patient is tolerating medications without difficulties and is otherwise without complaint today.   Past Medical History  Diagnosis Date  . CAD S/P percutaneous coronary angioplasty 07/2010; 07/2012; 08/2014    1) 2/'11: MI - 100% RCA - PCI (3 Taxus ION DES 3.0 x 28, 3.0 x 24 & 3.5 x 12 distal-prox);  b) ISR of RCA stent -AnngioSculpt PTCA; c) NSTEMI 1/'16: mild ISR with Thrombosis of RCA Stents (in setting of Mesenteric Vein Thrombosis) - Cutting Balloon PTCA  . NSTEMI (non-ST elevated myocardial infarction) 07/2010, 08/2014    And unstable angina in February 20 13th; Echo 1/12/'16: Moderate concentric LVH. EF 60-65% with no regional WMA. Gr 1 DD, otherwise normal  . Stroke 2011; 2013    S/P cardiac cath - embolic stroke; denies residual (07/06/2012)  . 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 04/19/2013    No evidence of CVA on MRI/MRA & Dliated Eye Examination. ruptured blood vessel & not occlusion.  . Coronary stent thrombosis 08/15/2014  . Hyperlipidemia with target LDL less than 70 09/01/2014  . Mesenteric vein thrombosis   . Essential hypertension 07/07/2012  . Migraines     "maybe one/yr" (07/06/2012)   Past Surgical History  Procedure Laterality Date  . Septoplasty  2001  . Coronary angioplasty with stent placement  07/2010    inferior wall MI - 3 Taxus Ion DES (3.0x52mm, 3.0x23mm, 3.5x32mm) to prox and mid RCA  . Coronary angioplasty  07/06/2012; 08/15/2014    a) 12/'13: PTCA of 80% ISR mRCA - AngioSculpt; b) PTCA of  ISR/thrombosis  . Cardiac catheterization  04/18/2013  . Left heart catheterization with coronary angiogram N/A 06/11/2012    Procedure: LEFT HEART CATHETERIZATION WITH  CORONARY ANGIOGRAM;  Surgeon: [name], MD;  Location: [facility] CATH LAB;  Service: Cardiovascular;  Laterality: N/A;  . Percutaneous coronary stent intervention (pci-s) N/A 07/06/2012    Procedure: PERCUTANEOUS CORONARY STENT INTERVENTION (PCI-S);  Surgeon: [name], MD;  Location: [facility] CATH LAB;  Service: Cardiovascular;  Laterality: N/A;  . Left heart catheterization with coronary angiogram N/A 04/18/2013    Procedure: LEFT HEART CATHETERIZATION WITH CORONARY ANGIOGRAM;  Surgeon: [name], MD;   Location: [facility] CATH LAB;  Service: Cardiovascular;  Laterality: N/A;  . Left heart catheterization with coronary angiogram N/A 08/15/2014    Procedure: LEFT HEART CATHETERIZATION WITH CORONARY ANGIOGRAM;  Surgeon: [name], MD;  Location: [facility] CATH LAB;  Service: Cardiovascular;  Laterality: N/A;  . Laparoscopic appendectomy N/A 08/28/2014    Procedure: APPENDECTOMY LAPAROSCOPIC;  Surgeon: [name], MD;  Location: [place];  Service: General;  Laterality: N/A;  . Bowel resection N/A 08/28/2014    Procedure: SMALL BOWEL RESECTION;  Surgeon: [name], MD;  Location: [place];  Service: General;  Laterality: N/A;  . Laparoscopy  08/28/2014    Procedure: LAPAROSCOPY DIAGNOSTIC;  Surgeon: [name], MD;  Location: [place];  Service: General;;  . Transthoracic echocardiogram  08/15/2014    Moderate concentric LVH. EF 60-65% with no regional WMA. Gr 1 DD, otherwise normal    Current Outpatient Prescriptions  Medication Sig Dispense Refill  . acetaminophen (TYLENOL) 500 MG tablet Take 1 tablet (500 mg total) by mouth every 4 (four) hours as needed for mild pain, fever or headache.    . clopidogrel (PLAVIX) 75 MG tablet Take 1 tablet (75 mg total) by mouth daily with breakfast. 90 tablet 1  . Evolocumab 140 MG/ML SOAJ Inject into the skin every 21 ( twenty-one) days.    . fenofibrate 160 MG tablet Take 1 tablet (160 mg total) by mouth daily. 30 tablet 11  . furosemide (LASIX) 20 MG tablet Take 20 mg by mouth daily.    Marland Kitchen KRILL OIL PO Take 1 capsule by mouth daily.    . metoprolol succinate (TOPROL-XL) 50 MG 24 hr tablet Take 50 mg by mouth daily. Take with or immediately following a meal.    . ZETIA 10 MG tablet Take 10 mg by mouth daily.   1  . nitroGLYCERIN (NITROSTAT) 0.4 MG SL tablet Place 1 tablet (0.4 mg total) under the tongue every 5 (five) minutes x 3 doses as needed for chest pain. 25 tablet 2  . olmesartan (BENICAR) 20 MG tablet Take 1 tablet (20 mg total) by mouth daily. 30  tablet 3   No current facility-administered medications for this encounter.    Allergies  Allergen Reactions  . Ace Inhibitors Cough  . Lipitor [Atorvastatin] Other (See Comments)    myalgia  . Crestor [Rosuvastatin] Other (See Comments)  . Zofran [Ondansetron Hcl] Nausea And Vomiting  . Codeine Itching    History   Social History  . Marital Status: Married    Spouse Name: N/A  . Number of Children: 2  . Years of Education: 12   Occupational History  . English as a second language teacher   Social History Main Topics  . Smoking status: Former Smoker -- 1.00 packs/day for 15 years    Types: Cigarettes    Quit date: 06/30/2010  . Smokeless tobacco: Former Systems developer    Types: [place] date: 06/30/2010  . Alcohol Use: 16.8 oz/week    28 Shots of liquor per week  Comment: 04/18/2013 "2 drinks/ day w/maybe 2 shots in each drink"  . Drug Use: No  . Sexual Activity: Yes   Other Topics Concern  . Not on file   Social History Narrative    Family History  Problem Relation Age of Onset  . Atrial fibrillation Mother   . Mitral valve prolapse Mother   . Coronary artery disease Father     CABG, aortic aneursym,    ROS- All systems are reviewed and negative except as per the HPI above  Physical Exam: Filed Vitals:   12/21/14 1451  BP: 140/92  Pulse: 85  Height: 6\' 1"  (1.854 m)  Weight: 267 lb 6.4 oz (121.292 kg)    GEN- The patient is well appearing, alert and oriented x 3 today.   Head- normocephalic, atraumatic Eyes-  Sclera clear, conjunctiva pink Ears- hearing intact Oropharynx- clear Neck- supple, no JVP Lymph- no cervical lymphadenopathy Lungs- Clear to ausculation bilaterally, normal work of breathing Heart- Slightly irregular rate and rhythm, consistent with PC's,  no murmurs, rubs or gallops, PMI not laterally displaced GI- soft, NT, ND, + BS Extremities- no clubbing, cyanosis, or edema MS- no significant deformity or atrophy Skin- no rash or lesion Psych-  euthymic mood, full affect Neuro- strength and sensation are intact  EKG-SR, 85 bpm, Pr int 142 ms, QRS 76 ms, QTc 426 ms, with PAC's. LABs reviewed see HPI. Epic records reviewed.  Assessment and Plan:  1. Dizziness,  suspect orthostatic hypotension  Stop HCTZ, continue lasix Will call in new RX, plain Benicar 20 mg qd.  Hydrate when at work, works in an unconditioned area. Transitional metoprolol to bedtime, currently taking all meds that can affect BP at 6am.   2.Palpitations, acute on chronic Maybe more noticeable in the setting of orthostatic hypotension Can try using OTC magnesium one a day. 48 hour hour monitor, will be placed at [facility] tomorrow at 3:30 pm.  When monitor reviewed, pt will be notified of results and if f/u indicated. If further issues, defer to [facility] office.

## 2014-12-21 NOTE — Telephone Encounter (Signed)
SPoke to patient - he reports "funny feeling" since last night of heart pounding. Intermittent palpitations, 5-10 minutes in duration, he reports ~20 episodes since yesterday.    BP was low last night, 100/63. He initially thought his symptoms might have something to do w/ blood pressure, because he felt faint, had episode of sweating. He denies CP, nausea, fatigue.  This AM, he again felt faint while at work, but this resolved. Pt checked BP this am - 125/80. Checked HR while on phone - 120bpm.  Informed patient I would look into add-on options or A Fib clinic - Called A Fib clinic, Marzetta Board was able to have patient seen this afternoon.  Called patient back and gave instructions on where to go as well as callback number for their office. He voiced understanding.

## 2014-12-22 ENCOUNTER — Telehealth: Payer: Self-pay | Admitting: Cardiology

## 2014-12-22 ENCOUNTER — Ambulatory Visit: Payer: BLUE CROSS/BLUE SHIELD | Admitting: *Deleted

## 2014-12-22 DIAGNOSIS — R002 Palpitations: Secondary | ICD-10-CM

## 2014-12-22 NOTE — Progress Notes (Unsigned)
48 hour monitor placement.

## 2014-12-22 NOTE — Telephone Encounter (Signed)
FORWARD TO [facility]

## 2014-12-22 NOTE — Patient Instructions (Signed)
Holter Monitoring A Holter monitor is a small device with electrodes (small sticky patches) that attach to your chest. It records the electrical activity of your heart and is worn continuously for 24-48 hours.  A HOLTER MONITOR IS USED TO  Detect heart problems such as:  Heart arrhythmia. Is an abnormal or irregular heartbeat. With some heart arrhythmias, you may not feel or know that you have an irregular heart rhythm.  Palpitations, such as feeling your heart racing or fluttering. It is possible to have heart palpitations and not have a heart arrhythmia.  A heart rhythm that is too slow or too fast.  If you have problems fainting, near fainting or feeling light-headed, a Holter monitor may be worn to see if your heart is the cause. HOLTER MONITOR PREPARATION   Electrodes will be attached to the skin on your chest.  If you have hair on your chest, small areas may have to be shaved. This is done to help the patches stick better and make the recording more accurate.  The electrodes are attached by wires to the Holter monitor. The Holter monitor clips to your clothing. You will wear the monitor at all times, even while exercising and sleeping. HOME CARE INSTRUCTIONS   Wear your monitor at all times.  The wires and the monitor must stay dry. Do not get the monitor wet.  Do not bathe, swim or use a hot tub with it on.  You may do a "sponge" bath while you have the monitor on.  Keep your skin clean, do not put body lotion or moisturizer on your chest.  It's possible that your skin under the electrodes could become irritated. To keep this from happening, you may put the electrodes in slightly different places on your chest.  Your caregiver will also ask you to keep a diary of your activities, such as walking or doing chores. Be sure to note what you are doing if you experience heart symptoms such as palpitations. This will help your caregiver determine what might be contributing to your  symptoms. The information stored in your monitor will be reviewed by your caregiver alongside your diary entries.  Make sure the monitor is safely clipped to your clothing or in a location close to your body that your caregiver recommends.  The monitor and electrodes are removed when the test is over. Return the monitor as directed.  Be sure to follow up with your caregiver and discuss your Holter monitor results. SEEK IMMEDIATE MEDICAL CARE IF:  You faint or feel lightheaded.  You have trouble breathing.  You get pain in your chest, upper arm or jaw.  You feel sick to your stomach and your skin is pale, cool, or damp.  You think something is wrong with the way your heart is beating. MAKE SURE YOU:   Understand these instructions.  Will watch your condition.  Will get help right away if you are not doing well or get worse. Document Released: 04/18/2004 Document Revised: 10/13/2011 Document Reviewed: 08/31/2008 ExitCare Patient Information 2015 [employer]. This information is not intended to replace advice given to you by your health care provider. Make sure you discuss any questions you have with your health care provider.  

## 2014-12-22 NOTE — Telephone Encounter (Signed)
The Repatha was denied,a letter was sent to the office. Please fax this letter to them,Fax [phone].

## 2015-01-04 ENCOUNTER — Other Ambulatory Visit: Payer: Self-pay

## 2015-01-04 ENCOUNTER — Telehealth: Payer: Self-pay | Admitting: Cardiology

## 2015-01-04 ENCOUNTER — Ambulatory Visit (INDEPENDENT_AMBULATORY_CARE_PROVIDER_SITE_OTHER): Payer: BLUE CROSS/BLUE SHIELD

## 2015-01-04 DIAGNOSIS — R002 Palpitations: Secondary | ICD-10-CM | POA: Diagnosis not present

## 2015-01-04 NOTE — Telephone Encounter (Signed)
Dylan [name] is calling to find out the results of the monitor he wore . Please call   Thanks

## 2015-01-04 NOTE — Telephone Encounter (Signed)
Chelley discussed w/ me, monitor report resubmitted - to be read by Dr. Ellyn Hack.  Call placed to patient - informed him monitor results still pending, will call as soon as results posted. He voiced understanding.

## 2015-01-08 ENCOUNTER — Telehealth (HOSPITAL_COMMUNITY): Payer: Self-pay | Admitting: *Deleted

## 2015-01-08 ENCOUNTER — Telehealth: Payer: Self-pay | Admitting: Hematology and Oncology

## 2015-01-08 NOTE — Telephone Encounter (Signed)
Due to call day moved 6/17 f/u to earlier time. Left message for patient re change with new time for 6/17 lab/NG @ 11:30 am. Schedule mailed.

## 2015-01-08 NOTE — Telephone Encounter (Signed)
Patient called wanting results of monitor -- donna did talk with him regarding what was showing so far but told him will call back once final report noted in chart.

## 2015-01-09 ENCOUNTER — Telehealth: Payer: Self-pay

## 2015-01-09 NOTE — Telephone Encounter (Signed)
Called patient with preliminary results regarding holter monitor. Some PVCs/PACs. No atrial fibrillation.   Patient expressed concerns regarding frequent symptoms. Consulted Dr Ellyn Hack regarding symptoms, per [facility], increase Metoprolol Succ (Toprol XL) to 75 mg daily. Hold Benicar if patient experiences any lightheadedness/dizziness or fluctuating blood pressure. Patient understood and agreed with plan.

## 2015-01-09 NOTE — Telephone Encounter (Signed)
See telephone note.

## 2015-01-17 NOTE — Telephone Encounter (Signed)
Repatha denied - Pharmacologist

## 2015-01-19 ENCOUNTER — Telehealth: Payer: Self-pay | Admitting: *Deleted

## 2015-01-19 ENCOUNTER — Ambulatory Visit: Payer: Self-pay | Admitting: Hematology and Oncology

## 2015-01-19 ENCOUNTER — Other Ambulatory Visit: Payer: Self-pay

## 2015-01-19 ENCOUNTER — Other Ambulatory Visit: Payer: Self-pay | Admitting: Hematology and Oncology

## 2015-01-19 ENCOUNTER — Telehealth: Payer: Self-pay | Admitting: Hematology and Oncology

## 2015-01-19 NOTE — Telephone Encounter (Signed)
Placed new POF for 7/7

## 2015-01-19 NOTE — Telephone Encounter (Signed)
Called pt about his appts today and he says he tried to call to get appts r/s.  He is out of town and will not be back until after July 4th.  He would like to r/s to after 7/4.

## 2015-01-19 NOTE — Telephone Encounter (Signed)
Lft msg for pt confirming r/s missed visit 06/17 POF, mailed schedule to pt .... Cherylann Banas

## 2015-02-06 ENCOUNTER — Telehealth: Payer: Self-pay | Admitting: Hematology and Oncology

## 2015-02-06 NOTE — Telephone Encounter (Signed)
pt called to cx appt.....pt did not want to r/s at this time °

## 2015-02-08 ENCOUNTER — Ambulatory Visit: Payer: Self-pay | Admitting: Hematology and Oncology

## 2015-02-08 ENCOUNTER — Other Ambulatory Visit: Payer: Self-pay

## 2015-03-15 ENCOUNTER — Other Ambulatory Visit: Payer: Self-pay | Admitting: Cardiology

## 2015-03-16 NOTE — Telephone Encounter (Signed)
REFILL 

## 2015-03-19 ENCOUNTER — Other Ambulatory Visit: Payer: Self-pay | Admitting: Pharmacist Clinician (PhC)/ Clinical Pharmacy Specialist

## 2015-03-19 MED ORDER — METOPROLOL SUCCINATE ER 25 MG PO TB24
37.5000 mg | ORAL_TABLET | Freq: Every day | ORAL | Status: DC
Start: 2015-03-19 — End: 2015-04-02

## 2015-03-19 NOTE — Telephone Encounter (Signed)
Pt called, stated he was told to increase metoprolol to 1.5 tablets daily, but Rx was never updated.  Now out of meds needs Rx with correct sig.  Looked thru chart, found note dated 6/7 to increase dose to 75 mg daily.  Pt originally on 25 mg daily, I suspect was meant to go to 37.5, which patient did.  Will clarify with Dr. Ellyn Hack after his vacation, will send rx to pharmacy now for 37.5 mg, which patient has been taking since June

## 2015-03-25 ENCOUNTER — Other Ambulatory Visit: Payer: Self-pay | Admitting: Hematology and Oncology

## 2015-03-30 ENCOUNTER — Other Ambulatory Visit: Payer: Self-pay | Admitting: Pharmacist Clinician (PhC)/ Clinical Pharmacy Specialist

## 2015-03-30 DIAGNOSIS — E785 Hyperlipidemia, unspecified: Secondary | ICD-10-CM

## 2015-03-30 MED ORDER — CLOPIDOGREL BISULFATE 75 MG PO TABS: 75.0000 mg | ORAL_TABLET | Freq: Every day | ORAL | Status: DC

## 2015-04-01 ENCOUNTER — Encounter (HOSPITAL_COMMUNITY): Payer: Self-pay

## 2015-04-01 ENCOUNTER — Emergency Department (HOSPITAL_COMMUNITY)
Admission: EM | Admit: 2015-04-01 | Discharge: 2015-04-01 | Discharge status: Against Medical Advice | Payer: BLUE CROSS/BLUE SHIELD | Attending: Emergency Medicine | Admitting: Emergency Medicine

## 2015-04-01 ENCOUNTER — Emergency Department (HOSPITAL_COMMUNITY): Payer: BLUE CROSS/BLUE SHIELD

## 2015-04-01 ENCOUNTER — Telehealth (HOSPITAL_COMMUNITY): Payer: Self-pay | Admitting: Internal Medicine

## 2015-04-01 DIAGNOSIS — I10 Essential (primary) hypertension: Secondary | ICD-10-CM | POA: Diagnosis not present

## 2015-04-01 DIAGNOSIS — R079 Chest pain, unspecified: Secondary | ICD-10-CM | POA: Insufficient documentation

## 2015-04-01 DIAGNOSIS — I2510 Atherosclerotic heart disease of native coronary artery without angina pectoris: Secondary | ICD-10-CM | POA: Insufficient documentation

## 2015-04-01 DIAGNOSIS — R0602 Shortness of breath: Secondary | ICD-10-CM | POA: Diagnosis not present

## 2015-04-01 LAB — CBC
HEMATOCRIT: 39.9 % (ref 39.0–52.0)
HEMOGLOBIN: 13.3 g/dL (ref 13.0–17.0)
MCH: 28.8 pg (ref 26.0–34.0)
MCHC: 33.3 g/dL (ref 30.0–36.0)
MCV: 86.4 fL (ref 78.0–100.0)
Platelets: 256 10*3/uL (ref 150–400)
RBC: 4.62 MIL/uL (ref 4.22–5.81)
RDW: 13.2 % (ref 11.5–15.5)
WBC: 5.6 10*3/uL (ref 4.0–10.5)

## 2015-04-01 LAB — BASIC METABOLIC PANEL
ANION GAP: 10 (ref 5–15)
BUN: 21 mg/dL — ABNORMAL HIGH (ref 6–20)
CO2: 24 mmol/L (ref 22–32)
Calcium: 9.4 mg/dL (ref 8.9–10.3)
Chloride: 105 mmol/L (ref 101–111)
Creatinine, Ser: 1.15 mg/dL (ref 0.61–1.24)
GLUCOSE: 140 mg/dL — AB (ref 65–99)
POTASSIUM: 3.5 mmol/L (ref 3.5–5.1)
Sodium: 139 mmol/L (ref 135–145)

## 2015-04-01 LAB — I-STAT TROPONIN, ED: Troponin i, poc: 0 ng/mL (ref 0.00–0.08)

## 2015-04-01 NOTE — ED Notes (Signed)
Onset 1 week intermittant mid to left chest pain and shortness of breath.  Pain got constant and worse today.  Pt in NAD, talking in complete sentences.

## 2015-04-01 NOTE — ED Notes (Signed)
Pts wife states that pt had posterior EKG done which diagnosed his MI, ordered this EKG as wife states that she is concerned that pt is not doing well and feeling worse

## 2015-04-01 NOTE — Telephone Encounter (Signed)
49 y/o with complicated PMHx including CAD with previous RCA stenting c/b ISR. He paged the on-call pager from the ER waiting room tonight. Says recently he has been having more chest pain and palpitations. Was working hard to day and just didn't feel right so came to ER. In ER he had 2 ECGs and a troponin level all of which were ok (I reviewed personally). He states CP doesn't feel like previous angina but he wanted to be sure. Hasn't been able to see ER doctor due to wait time. Wants to go home now. I told him that if he was feeling better it was probably ok to go home but if he ahd any questions about his symptoms should wait to be more formally evaluated. He felt comfortable going home. Has f/u appt with Dr. Ellyn Hack in am.   Venora Kautzman,MD 10:53 PM

## 2015-04-02 ENCOUNTER — Other Ambulatory Visit: Payer: Self-pay | Admitting: Cardiology

## 2015-04-02 ENCOUNTER — Telehealth: Payer: Self-pay | Admitting: *Deleted

## 2015-04-02 LAB — LIPID PANEL
CHOL/HDL RATIO: 5.1 ratio — AB (ref <=5.0)
CHOLESTEROL: 179 mg/dL (ref 125–200)
HDL: 35 mg/dL — AB (ref >=40)
LDL CALC: 118 mg/dL (ref <130)
TRIGLYCERIDES: 132 mg/dL (ref <150)
VLDL: 26 mg/dL (ref <30)

## 2015-04-02 MED ORDER — METOPROLOL SUCCINATE ER 100 MG PO TB24: 100.0000 mg | ORAL_TABLET | Freq: Every day | ORAL | Status: DC

## 2015-04-02 MED ORDER — METOPROLOL SUCCINATE ER 50 MG PO TB24: ORAL_TABLET | ORAL | Status: DC

## 2015-04-02 NOTE — Telephone Encounter (Signed)
Thanks  DH 

## 2015-04-02 NOTE — Telephone Encounter (Signed)
New dose recommendation given to patient, instruction to call back to update for any concerns/reassessment in 1 week.  Rx sent to preferred pharmacy electronically.

## 2015-04-02 NOTE — Telephone Encounter (Signed)
In person visit. Pt states ED visit yesterday for palps, short of breath and lightheaded yesterday. He states PAC's that were "going off" every 3rd beat. Eval'd, elected to leave ED before complete w/u - also states he had been talking to Dr. Haroldine Laws on phone while in ED.  In short, he'd felt better and initial EKG and labwork looked OK so he'd gone home - wanted to take care of young child and pregnant wife at home.   Came today after lab appt for lipids, wanted to apprise of what was going on. States continued PACs, not as bad as yesterday. Showed me HR wristwatch device he has that shows his beats. Noted normal rate at this time.  Pt wanted advice, no eval. He takes metoprolol succ 75mg  daily, I advised addtl 25mg  if symptoms but clarified that I would seek physician review for more longstanding advice.  Pt voiced understanding. He had no further concerns at this time and will wait for call.  Fwd to primary cardiologist for review.   Also refilled metoprolol as was filled for incorrect dose last time.

## 2015-04-02 NOTE — Telephone Encounter (Signed)
Patient is returning a call to [facility]

## 2015-04-02 NOTE — Telephone Encounter (Signed)
I would just increase to 100 mg BID for next ~ week or so -- reassess @ that time.   May just keep @ that dose.  [place]

## 2015-04-02 NOTE — Telephone Encounter (Signed)
Clarified dose recommendation w/ Dr. Ellyn Hack for 100mg  toprol DAILY  Called pt and left msg on machine for him to call.

## 2015-04-11 ENCOUNTER — Other Ambulatory Visit: Payer: Self-pay | Admitting: Pharmacist Clinician (PhC)/ Clinical Pharmacy Specialist

## 2015-04-11 MED ORDER — FUROSEMIDE 20 MG PO TABS: 20.0000 mg | ORAL_TABLET | Freq: Every day | ORAL | Status: DC

## 2015-04-25 ENCOUNTER — Other Ambulatory Visit: Payer: Self-pay | Admitting: Pharmacist Clinician (PhC)/ Clinical Pharmacy Specialist

## 2015-04-25 MED ORDER — EVOLOCUMAB 140 MG/ML ~~LOC~~ SOAJ: 140.0000 mg | SUBCUTANEOUS | Status: DC

## 2015-05-07 ENCOUNTER — Telehealth: Payer: Self-pay | Admitting: Cardiology

## 2015-05-07 MED ORDER — OLMESARTAN MEDOXOMIL 20 MG PO TABS: 10.0000 mg | ORAL_TABLET | Freq: Every day | ORAL | Status: DC

## 2015-05-07 NOTE — Telephone Encounter (Signed)
Would restart 10 mg Benicar - see how he dose with dizziness & BP control.  [name], MD

## 2015-05-07 NOTE — Telephone Encounter (Addendum)
Patient checked BP last night, around 180/100. Continued to check and noted last reading he got was 220/115.  He notes a little anxious this AM, "not feeling good". Checked at work by [employer] today, BP was 178/91 w/ HR of 76.  He has Benicar listed on his sheet, he was taking 20mg  earlier this year, reduced to 10mg  daily after a metoprolol increase at [facility] 12/07/14. On 01/09/15 he received call w/ instructions to increase metoprolol further and hold Benicar if symptomatic. Pt took this to mean discontinuing Benicar - he has not taken since. However, he notes this is the first time he has had BPs in this range.  He notes no palpitations, no chest pain, nausea, SOB, or dizziness. Instructed to continue meds as he takes right now - likely will need to resume Benicar, but he does not have on hand currently (bottle at home, he is at work). Pt instructed to rest, go to ED if develops worsening symptoms. Routed to Dr. Ellyn Hack for further advice.

## 2015-05-07 NOTE — Telephone Encounter (Signed)
Pt says he had an episode last nigh,he felt like he was going to pass out. His blood pressure was 220/115.Please call asap to advise,he have not checked it this morning. He says he is not feeling too good.

## 2015-05-07 NOTE — Telephone Encounter (Signed)
Pt given advice to restart Benicar, continue to check BPs and HRs at daily or BID freq, call if concerns or symptoms continue, call for dizziness or BPs out of expected ranges.  Pt agreeable to this plan, voiced understanding of instructions.

## 2015-05-13 ENCOUNTER — Inpatient Hospital Stay (HOSPITAL_COMMUNITY): Payer: BLUE CROSS/BLUE SHIELD

## 2015-05-13 ENCOUNTER — Encounter (HOSPITAL_COMMUNITY): Payer: Self-pay

## 2015-05-13 ENCOUNTER — Observation Stay (HOSPITAL_COMMUNITY)
Admission: EM | Admit: 2015-05-13 | Discharge: 2015-05-13 | Disposition: A | Discharge status: Home / Self Care | Payer: BLUE CROSS/BLUE SHIELD | Attending: Internal Medicine | Admitting: Internal Medicine

## 2015-05-13 DIAGNOSIS — E785 Hyperlipidemia, unspecified: Secondary | ICD-10-CM | POA: Insufficient documentation

## 2015-05-13 DIAGNOSIS — I252 Old myocardial infarction: Secondary | ICD-10-CM | POA: Diagnosis not present

## 2015-05-13 DIAGNOSIS — I10 Essential (primary) hypertension: Secondary | ICD-10-CM | POA: Diagnosis not present

## 2015-05-13 DIAGNOSIS — I69398 Other sequelae of cerebral infarction: Secondary | ICD-10-CM | POA: Insufficient documentation

## 2015-05-13 DIAGNOSIS — R0602 Shortness of breath: Secondary | ICD-10-CM | POA: Diagnosis not present

## 2015-05-13 DIAGNOSIS — IMO0002 Reserved for concepts with insufficient information to code with codable children: Secondary | ICD-10-CM

## 2015-05-13 DIAGNOSIS — Z955 Presence of coronary angioplasty implant and graft: Secondary | ICD-10-CM | POA: Insufficient documentation

## 2015-05-13 DIAGNOSIS — R072 Precordial pain: Secondary | ICD-10-CM

## 2015-05-13 DIAGNOSIS — I160 Hypertensive urgency: Secondary | ICD-10-CM | POA: Insufficient documentation

## 2015-05-13 DIAGNOSIS — Z79899 Other long term (current) drug therapy: Secondary | ICD-10-CM | POA: Insufficient documentation

## 2015-05-13 DIAGNOSIS — Z8673 Personal history of transient ischemic attack (TIA), and cerebral infarction without residual deficits: Secondary | ICD-10-CM

## 2015-05-13 DIAGNOSIS — R0789 Other chest pain: Principal | ICD-10-CM | POA: Insufficient documentation

## 2015-05-13 DIAGNOSIS — I81 Portal vein thrombosis: Secondary | ICD-10-CM

## 2015-05-13 DIAGNOSIS — I200 Unstable angina: Secondary | ICD-10-CM | POA: Diagnosis present

## 2015-05-13 DIAGNOSIS — I2510 Atherosclerotic heart disease of native coronary artery without angina pectoris: Secondary | ICD-10-CM | POA: Diagnosis not present

## 2015-05-13 DIAGNOSIS — Z7902 Long term (current) use of antithrombotics/antiplatelets: Secondary | ICD-10-CM | POA: Insufficient documentation

## 2015-05-13 DIAGNOSIS — Z9861 Coronary angioplasty status: Secondary | ICD-10-CM | POA: Diagnosis not present

## 2015-05-13 DIAGNOSIS — T82867A Thrombosis of cardiac prosthetic devices, implants and grafts, initial encounter: Secondary | ICD-10-CM | POA: Diagnosis present

## 2015-05-13 DIAGNOSIS — H5461 Unqualified visual loss, right eye, normal vision left eye: Secondary | ICD-10-CM | POA: Diagnosis not present

## 2015-05-13 DIAGNOSIS — R079 Chest pain, unspecified: Secondary | ICD-10-CM | POA: Diagnosis not present

## 2015-05-13 DIAGNOSIS — Z87891 Personal history of nicotine dependence: Secondary | ICD-10-CM | POA: Insufficient documentation

## 2015-05-13 DIAGNOSIS — K55069 Acute infarction of intestine, part and extent unspecified: Secondary | ICD-10-CM | POA: Diagnosis present

## 2015-05-13 DIAGNOSIS — T82857D Stenosis of cardiac prosthetic devices, implants and grafts, subsequent encounter: Secondary | ICD-10-CM

## 2015-05-13 LAB — I-STAT TROPONIN, ED: Troponin i, poc: 0 ng/mL (ref 0.00–0.08)

## 2015-05-13 LAB — BASIC METABOLIC PANEL
Anion gap: 10 (ref 5–15)
BUN: 19 mg/dL (ref 6–20)
CALCIUM: 9.1 mg/dL (ref 8.9–10.3)
CO2: 26 mmol/L (ref 22–32)
CREATININE: 0.99 mg/dL (ref 0.61–1.24)
Chloride: 100 mmol/L — ABNORMAL LOW (ref 101–111)
GFR calc non Af Amer: >60 mL/min (ref >60)
GLUCOSE: 120 mg/dL — AB (ref 65–99)
Potassium: 3.5 mmol/L (ref 3.5–5.1)
Sodium: 136 mmol/L (ref 135–145)

## 2015-05-13 LAB — CBC
HCT: 40.7 % (ref 39.0–52.0)
Hemoglobin: 13.6 g/dL (ref 13.0–17.0)
MCH: 28.6 pg (ref 26.0–34.0)
MCHC: 33.4 g/dL (ref 30.0–36.0)
MCV: 85.7 fL (ref 78.0–100.0)
PLATELETS: 200 10*3/uL (ref 150–400)
RBC: 4.75 MIL/uL (ref 4.22–5.81)
RDW: 13.3 % (ref 11.5–15.5)
WBC: 5.2 10*3/uL (ref 4.0–10.5)

## 2015-05-13 LAB — RAPID URINE DRUG SCREEN, HOSP PERFORMED
Amphetamines: NOT DETECTED
BENZODIAZEPINES: NOT DETECTED
Barbiturates: NOT DETECTED
Cocaine: NOT DETECTED
OPIATES: NOT DETECTED
Tetrahydrocannabinol: NOT DETECTED

## 2015-05-13 LAB — GLUCOSE, CAPILLARY: Glucose-Capillary: 121 mg/dL — ABNORMAL HIGH (ref 65–99)

## 2015-05-13 LAB — APTT: APTT: 26 s (ref 24–37)

## 2015-05-13 LAB — PROTIME-INR
INR: 1.02 (ref 0.00–1.49)
PROTHROMBIN TIME: 13.6 s (ref 11.6–15.2)

## 2015-05-13 LAB — TROPONIN I

## 2015-05-13 MED ORDER — KRILL OIL 1000 MG PO CAPS: 1.0000 | ORAL_CAPSULE | Freq: Every day | ORAL | Status: DC

## 2015-05-13 MED ORDER — CLOPIDOGREL BISULFATE 75 MG PO TABS: 75.0000 mg | ORAL_TABLET | Freq: Every day | ORAL | Status: DC

## 2015-05-13 MED ORDER — ACETAMINOPHEN 325 MG PO TABS
650.0000 mg | ORAL_TABLET | Freq: Four times a day (QID) | ORAL | Status: DC | PRN
  Administered 2015-05-13: 650 mg via ORAL
  Filled 2015-05-13: qty 2

## 2015-05-13 MED ORDER — HEPARIN BOLUS VIA INFUSION
4000.0000 [IU] | Freq: Once | INTRAVENOUS | Status: AC
  Administered 2015-05-13: 4000 [IU] via INTRAVENOUS
  Filled 2015-05-13: qty 4000

## 2015-05-13 MED ORDER — METOPROLOL SUCCINATE ER 50 MG PO TB24: 50.0000 mg | ORAL_TABLET | Freq: Two times a day (BID) | ORAL | Status: DC

## 2015-05-13 MED ORDER — EZETIMIBE 10 MG PO TABS: 10.0000 mg | ORAL_TABLET | Freq: Every day | ORAL | Status: DC

## 2015-05-13 MED ORDER — SODIUM CHLORIDE 0.9 % IJ SOLN
3.0000 mL | Freq: Two times a day (BID) | INTRAMUSCULAR | Status: DC
  Administered 2015-05-13: 3 mL via INTRAVENOUS

## 2015-05-13 MED ORDER — MAGNESIUM 200 MG PO TABS: 1.0000 | ORAL_TABLET | Freq: Every day | ORAL | Status: DC

## 2015-05-13 MED ORDER — METOPROLOL SUCCINATE ER 100 MG PO TB24: 100.0000 mg | ORAL_TABLET | Freq: Every day | ORAL | Status: DC

## 2015-05-13 MED ORDER — FENOFIBRATE 160 MG PO TABS: 160.0000 mg | ORAL_TABLET | Freq: Every day | ORAL | Status: DC

## 2015-05-13 MED ORDER — POTASSIUM CHLORIDE CRYS ER 20 MEQ PO TBCR
40.0000 meq | EXTENDED_RELEASE_TABLET | Freq: Once | ORAL | Status: AC
  Administered 2015-05-13: 40 meq via ORAL
  Filled 2015-05-13: qty 2

## 2015-05-13 MED ORDER — OLMESARTAN MEDOXOMIL 20 MG PO TABS: 20.0000 mg | ORAL_TABLET | Freq: Every day | ORAL | Status: DC

## 2015-05-13 MED ORDER — EVOLOCUMAB 140 MG/ML ~~LOC~~ SOAJ: 140.0000 mg | SUBCUTANEOUS | Status: DC

## 2015-05-13 MED ORDER — ACETAMINOPHEN 650 MG RE SUPP: 650.0000 mg | Freq: Four times a day (QID) | RECTAL | Status: DC | PRN

## 2015-05-13 MED ORDER — FUROSEMIDE 20 MG PO TABS: 20.0000 mg | ORAL_TABLET | Freq: Every day | ORAL | Status: DC

## 2015-05-13 MED ORDER — MORPHINE SULFATE (PF) 2 MG/ML IV SOLN
2.0000 mg | INTRAVENOUS | Status: DC | PRN
  Administered 2015-05-13: 2 mg via INTRAVENOUS
  Filled 2015-05-13: qty 1

## 2015-05-13 MED ORDER — MAGNESIUM OXIDE 400 (241.3 MG) MG PO TABS: 200.0000 mg | ORAL_TABLET | Freq: Every day | ORAL | Status: DC

## 2015-05-13 MED ORDER — IRBESARTAN 150 MG PO TABS: 150.0000 mg | ORAL_TABLET | Freq: Every day | ORAL | Status: DC

## 2015-05-13 MED ORDER — HEPARIN (PORCINE) IN NACL 100-0.45 UNIT/ML-% IJ SOLN
2000.0000 [IU]/h | INTRAMUSCULAR | Status: DC
  Administered 2015-05-13: 2000 [IU]/h via INTRAVENOUS
  Filled 2015-05-13: qty 250

## 2015-05-13 MED ORDER — NITROGLYCERIN IN D5W 200-5 MCG/ML-% IV SOLN
10.0000 ug/min | INTRAVENOUS | Status: DC
  Administered 2015-05-13: 10 ug/min via INTRAVENOUS
  Filled 2015-05-13: qty 250

## 2015-05-13 NOTE — ED Provider Notes (Signed)
CSN: [id number]     Arrival date & time 05/13/15  0407 History   First MD Initiated Contact with Patient 05/13/15 0449     Chief Complaint  Patient presents with  . Chest Pain     (Consider location/radiation/quality/duration/timing/severity/associated sxs/prior Treatment) Patient is a 49 y.o. male presenting with chest pain. The history is provided by the patient.  Chest Pain He has a history of coronary artery disease with coronary stents. Last night, while watching television, he developed a pressure feeling in his chest and difficulty breathing. He took a nitroglycerin tablet with relief of all discomfort and went to sleep. He was awakened at 3:30 AM with recurrence of chest pressure and dyspnea. There was no associated nausea or diaphoresis. He took a nitroglycerin with improvement in the discomfort but not complete relief. He rated the discomfort at 8/10 at its worst, and it is now 4/10. Nothing makes it better nothing makes it worse. He also checked his blood pressure and found that it was markedly elevated at 189/120. He relates that he has been working with his cardiologist to manage his blood pressure. He does relate exertional angina on walking about 100 yards, but this is been stable over time and he has not noticed any recent decrease in exercise tolerance.  Past Medical History  Diagnosis Date  . CAD S/P percutaneous coronary angioplasty 07/2010; 07/2012; 08/2014    1) 2/'11: MI - 100% RCA - PCI (3 Taxus ION DES 3.0 x 28, 3.0 x 24 & 3.5 x 12 distal-prox); b) ISR of RCA stent -AnngioSculpt PTCA; c) NSTEMI 1/'16: mild ISR with Thrombosis of RCA Stents (in setting of Mesenteric Vein Thrombosis) - Cutting Balloon PTCA  . NSTEMI (non-ST elevated myocardial infarction) ([place]) 07/2010, 08/2014    And unstable angina in February 20 13th; Echo 1/12/'16: Moderate concentric LVH. EF 60-65% with no regional WMA. Gr 1 DD, otherwise normal  . Stroke ([place]) 2011; 2013    S/P cardiac cath - embolic  stroke; denies residual (07/06/2012)  . 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 04/19/2013    No evidence of CVA on MRI/MRA & Dliated Eye Examination. ruptured blood vessel & not occlusion.  . Coronary stent thrombosis 08/15/2014  . Hyperlipidemia with target LDL less than 70 09/01/2014  . Mesenteric vein thrombosis   . Essential hypertension 07/07/2012  . Migraines     "maybe one/yr" (07/06/2012)   Past Surgical History  Procedure Laterality Date  . Septoplasty  2001  . Coronary angioplasty with stent placement  07/2010    inferior wall MI - 3 Taxus Ion DES (3.0x60mm, 3.0x64mm, 3.5x32mm) to prox and mid RCA  . Coronary angioplasty  07/06/2012; 08/15/2014    a) 12/'13: PTCA of 80% ISR mRCA - AngioSculpt; b) PTCA of  ISR/thrombosis  . Cardiac catheterization  04/18/2013  . Left heart catheterization with coronary angiogram N/A 06/11/2012    Procedure: LEFT HEART CATHETERIZATION WITH CORONARY ANGIOGRAM;  Surgeon: [name], MD;  Location: [facility] CATH LAB;  Service: Cardiovascular;  Laterality: N/A;  . Percutaneous coronary stent intervention (pci-s) N/A 07/06/2012    Procedure: PERCUTANEOUS CORONARY STENT INTERVENTION (PCI-S);  Surgeon: [name], MD;  Location: [facility] CATH LAB;  Service: Cardiovascular;  Laterality: N/A;  . Left heart catheterization with coronary angiogram N/A 04/18/2013    Procedure: LEFT HEART CATHETERIZATION WITH CORONARY ANGIOGRAM;  Surgeon: [name], MD;  Location: [facility]  CATH LAB;  Service: Cardiovascular;  Laterality: N/A;  . Left heart catheterization with coronary angiogram N/A 08/15/2014    Procedure: LEFT HEART CATHETERIZATION WITH CORONARY ANGIOGRAM;  Surgeon: [name], MD;  Location: [facility] CATH LAB;  Service: Cardiovascular;  Laterality: N/A;  . Laparoscopic appendectomy N/A 08/28/2014    Procedure: APPENDECTOMY LAPAROSCOPIC;  Surgeon: [name], MD;   Location: [place];  Service: General;  Laterality: N/A;  . Bowel resection N/A 08/28/2014    Procedure: SMALL BOWEL RESECTION;  Surgeon: [name], MD;  Location: [place];  Service: General;  Laterality: N/A;  . Laparoscopy  08/28/2014    Procedure: LAPAROSCOPY DIAGNOSTIC;  Surgeon: [name], MD;  Location: [place];  Service: General;;  . Transthoracic echocardiogram  08/15/2014    Moderate concentric LVH. EF 60-65% with no regional WMA. Gr 1 DD, otherwise normal   Family History  Problem Relation Age of Onset  . Atrial fibrillation Mother   . Mitral valve prolapse Mother   . Coronary artery disease Father     CABG, aortic aneursym,   Social History  Substance Use Topics  . Smoking status: Former Smoker -- 1.00 packs/day for 15 years    Types: Cigarettes    Quit date: 06/30/2010  . Smokeless tobacco: Former Systems developer    Types: [place] date: 06/30/2010  . Alcohol Use: 0.6 oz/week    1 Shots of liquor per week     Comment: a  glass of scotch per day    Review of Systems  Cardiovascular: Positive for chest pain.  All other systems reviewed and are negative.     Allergies  Ace inhibitors; Lipitor; Crestor; Zofran; and Codeine  Home Medications   Prior to Admission medications   Medication Sig Start Date End Date Taking? Authorizing Provider  acetaminophen (TYLENOL) 500 MG tablet Take 1 tablet (500 mg total) by mouth every 4 (four) hours as needed for mild pain, fever or headache. 08/16/14   Cherene Altes, MD  clopidogrel (PLAVIX) 75 MG tablet Take 1 tablet (75 mg total) by mouth daily with breakfast. 03/30/15   [name], MD  Evolocumab 140 MG/ML SOAJ Inject 140 mg into the skin every 14 (fourteen) days. 04/25/15   [name], MD  fenofibrate 160 MG tablet Take 1 tablet (160 mg total) by mouth daily. 09/18/14   [name], MD  furosemide (LASIX) 20 MG tablet Take 1 tablet (20 mg total) by mouth daily. 04/11/15   [name], MD  KRILL OIL PO Take 1  capsule by mouth daily.    Historical Provider, MD  metoprolol succinate (TOPROL-XL) 100 MG 24 hr tablet Take 1 tablet (100 mg total) by mouth daily. 04/02/15   [name], MD  nitroGLYCERIN (NITROSTAT) 0.4 MG SL tablet Place 1 tablet (0.4 mg total) under the tongue every 5 (five) minutes x 3 doses as needed for chest pain. 07/07/12   Erlene Quan, PA-C  olmesartan (BENICAR) 20 MG tablet Take 0.5 tablets (10 mg total) by mouth daily. 05/07/15   [name], MD  ZETIA 10 MG tablet Take 10 mg by mouth daily.  08/17/14   Historical Provider, MD   BP 123/78 mmHg  Pulse 77  Temp(Src) 98.1 F (36.7 C) (Oral)  Resp 13  SpO2 95% Physical Exam  Nursing note and vitals reviewed.  49 year old male, resting comfortably and in no acute distress. Vital signs are normal. Oxygen saturation is 95%, which  is normal. Head is normocephalic and atraumatic. PERRLA, EOMI. Oropharynx is clear. Neck is nontender and supple without adenopathy or JVD. Back is nontender and there is no CVA tenderness. Lungs are clear without rales, wheezes, or rhonchi. Chest is nontender. Heart has regular rate and rhythm without murmur. Abdomen is soft, flat, nontender without masses or hepatosplenomegaly and peristalsis is normoactive. Extremities have no cyanosis or edema, full range of motion is present. Skin is warm and dry without rash. Neurologic: Mental status is normal, cranial nerves are intact, there are no motor or sensory deficits.  ED Course  Procedures (including critical care time) Labs Review Results for orders placed or performed during the hospital encounter of 36/64/40  Basic metabolic panel  Result Value Ref Range   Sodium 136 135 - 145 mmol/L   Potassium 3.5 3.5 - 5.1 mmol/L   Chloride 100 (L) 101 - 111 mmol/L   CO2 26 22 - 32 mmol/L   Glucose, Bld 120 (H) 65 - 99 mg/dL   BUN 19 6 - 20 mg/dL   Creatinine, Ser 0.99 0.61 - 1.24 mg/dL   Calcium 9.1 8.9 - 10.3 mg/dL   GFR calc non Af Amer >60 >60  mL/min   GFR calc Af Amer >60 >60 mL/min   Anion gap 10 5 - 15  CBC  Result Value Ref Range   WBC 5.2 4.0 - 10.5 K/uL   RBC 4.75 4.22 - 5.81 MIL/uL   Hemoglobin 13.6 13.0 - 17.0 g/dL   HCT 40.7 39.0 - 52.0 %   MCV 85.7 78.0 - 100.0 fL   MCH 28.6 26.0 - 34.0 pg   MCHC 33.4 30.0 - 36.0 g/dL   RDW 13.3 11.5 - 15.5 %   Platelets 200 150 - 400 K/uL  Protime-INR - (order if Patient is taking Coumadin / Warfarin)  Result Value Ref Range   Prothrombin Time 13.6 11.6 - 15.2 seconds   INR 1.02 0.00 - 1.49  Urine rapid drug screen (hosp performed)  Result Value Ref Range   Opiates NONE DETECTED NONE DETECTED   Cocaine NONE DETECTED NONE DETECTED   Benzodiazepines NONE DETECTED NONE DETECTED   Amphetamines NONE DETECTED NONE DETECTED   Tetrahydrocannabinol NONE DETECTED NONE DETECTED   Barbiturates NONE DETECTED NONE DETECTED  Troponin I (q 6hr x 3)  Result Value Ref Range   Troponin I <0.03 <0.031 ng/mL  APTT  Result Value Ref Range   aPTT 26 24 - 37 seconds  I-stat troponin, ED  Result Value Ref Range   Troponin i, poc 0.00 0.00 - 0.08 ng/mL   Comment 3           I have personally reviewed and evaluated these images and lab results as part of my medical decision-making.   EKG Interpretation   Date/Time:  Sunday May 13 2015 04:26:13 EDT Ventricular Rate:  77 PR Interval:  162 QRS Duration: 85 QT Interval:  375 QTC Calculation: 424 R Axis:   31 Text Interpretation:  Sinus rhythm Low voltage, precordial leads Baseline  wander in lead(s) V3 When compared with ECG of 04/01/2015, Premature atrial  complexes are no longer Present Confirmed by [facility]  MD, Jabier Deese ([id number]) on  05/13/2015 4:48:04 AM      MDM   Final diagnoses:  Chest pain, unspecified    Chest pain worrisome for unstable angina. Old records are reviewed confirming presence of the multiple right coronary stents and an episode of non-STEMI secondary to in-stent restenosis last January. He is  started on heparin  and nitroglycerin drips and will be admitted.  Initial troponin is normal. Case is discussed with Dr. Blaine Hamper triad hospitalists who agrees to admit the patient.  Delora Fuel, MD 19/14/78 [id number]

## 2015-05-13 NOTE — Consult Note (Signed)
CONSULTATION NOTE  Reason for Consult: Chest pressure  Requesting Physician: Dr. Grandville Silos  Cardiologist: Dr. Ellyn Hack  HPI: This is a 49 y.o. male with long-standing cardiac history including non-STEMI in November 2011 with 100% occluded RCA after the conus branch that required extensive PCI and multiple Taxus drug-eluting stents. He then presented in December 2013 with in-stent restenosis and received PTCA. In January 2016 he developed mesenteric vein thrombosis and was treated with thrombectomy. It was thought this might be related to testosterone injection. Repeat catheterization that time showed diffuse in-stent thrombosis but no significant stenosis. Subsequently he re-presented with abdominal pain and was found to have small bowel perforation and underwent exploratory laparoscopy followed by partial small bowel resection. He was recently seen by Dr. Ellyn Hack and noted to be having several episodes of his heart racing for several minutes without a clear etiology. He was thought to have some chest wall pain, noting his previous anginal equivalent was jaw pain and sweating associated with nausea and dyspnea. Aspirin was discontinued and he was continued on Plavix. He is also on per Pradaxa (off-label) due to mesenteric vein thrombosis.  He now presents with chest pressure that occurred around 9:00 last evening. He took nitroglycerin and symptoms resolved. He checked his blood pressure was noted to be markedly elevated at over 200/100 . He was also having frequent PACs. Earlier this morning he again woke up with chest pressure and it remained. He reports it's associated with some shortness of breath. He did check his blood pressure which was apparently elevated. He took additional nitroglycerin. Lab work in the ER demonstrated a negative troponin 2. EKG shows sinus rhythm with PACs, unchanged from a prior EKG in August 2016.  Chest x-ray shows no acute disease. Tox screen was performed and was  negative. Laboratory work is unremarkable. Blood pressure has now normalized however he is on nitroglycerin drip and heparin drip. Cardiology is asked to evaluate for chest pressure.  PMHx:  Past Medical History  Diagnosis Date  . CAD S/P percutaneous coronary angioplasty 07/2010; 07/2012; 08/2014    1) 2/'11: MI - 100% RCA - PCI (3 Taxus ION DES 3.0 x 28, 3.0 x 24 & 3.5 x 12 distal-prox); b) ISR of RCA stent -AnngioSculpt PTCA; c) NSTEMI 1/'16: mild ISR with Thrombosis of RCA Stents (in setting of Mesenteric Vein Thrombosis) - Cutting Balloon PTCA  . NSTEMI (non-ST elevated myocardial infarction) ([place]) 07/2010, 08/2014    And unstable angina in February 20 13th; Echo 1/12/'16: Moderate concentric LVH. EF 60-65% with no regional WMA. Gr 1 DD, otherwise normal  . Stroke ([place]) 2011; 2013    S/P cardiac cath - embolic stroke; denies residual (07/06/2012)  . 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 04/19/2013    No evidence of CVA on MRI/MRA & Dliated Eye Examination. ruptured blood vessel & not occlusion.  . Coronary stent thrombosis 08/15/2014  . Hyperlipidemia with target LDL less than 70 09/01/2014  . Mesenteric vein thrombosis   . Essential hypertension 07/07/2012  . Migraines     "maybe one/yr" (07/06/2012)   Past Surgical History  Procedure Laterality Date  . Septoplasty  2001  . Coronary angioplasty with stent placement  07/2010    inferior wall MI - 3 Taxus Ion DES (3.0x33m, 3.0x262m 3.5x1227mto prox and mid RCA  . Coronary angioplasty  07/06/2012; 08/15/2014    a) 12/'13: PTCA of  80% ISR mRCA - AngioSculpt; b) PTCA of  ISR/thrombosis  . Cardiac catheterization  04/18/2013  . Left heart catheterization with coronary angiogram N/A 06/11/2012    Procedure: LEFT HEART CATHETERIZATION WITH CORONARY ANGIOGRAM;  Surgeon: [name], MD;  Location: [facility] CATH LAB;  Service: Cardiovascular;   Laterality: N/A;  . Percutaneous coronary stent intervention (pci-s) N/A 07/06/2012    Procedure: PERCUTANEOUS CORONARY STENT INTERVENTION (PCI-S);  Surgeon: [name], MD;  Location[facility] CATH LAB;  Service: Cardiovascular;  Laterality: N/A;  . Left heart catheterization with coronary angiogram N/A 04/18/2013    Procedure: LEFT HEART CATHETERIZATION WITH CORONARY ANGIOGRAM;  Surgeon: [name], MD;  Location: [facility] CATH LAB;  Service: Cardiovascular;  Laterality: N/A;  . Left heart catheterization with coronary angiogram N/A 08/15/2014    Procedure: LEFT HEART CATHETERIZATION WITH CORONARY ANGIOGRAM;  Surgeon: [name], MD;  Location: [facility] CATH LAB;  Service: Cardiovascular;  Laterality: N/A;  . Laparoscopic appendectomy N/A 08/28/2014    Procedure: APPENDECTOMY LAPAROSCOPIC;  Surgeon: [name], MD;  Location: [place] [facility];  Service: General;  Laterality: N/A;  . Bowel resection N/A 08/28/2014    Procedure: SMALL BOWEL RESECTION;  Surgeon: [name], MD;  Location: [place];  Service: General;  Laterality: N/A;  . Laparoscopy  08/28/2014    Procedure: LAPAROSCOPY DIAGNOSTIC;  Surgeon: [name], MD;  Location: [place];  Service: General;;  . Transthoracic echocardiogram  08/15/2014    Moderate concentric LVH. EF 60-65% with no regional WMA. Gr 1 DD, otherwise normal    FAMHx: Family History  Problem Relation Age of Onset  . Atrial fibrillation Mother   . Mitral valve prolapse Mother   . Coronary artery disease Father     CABG, aortic aneursym,    SOCHx:  reports that he quit smoking about 4 years ago. His smoking use included Cigarettes. He has a 15 pack-year smoking history. He quit smokeless tobacco use about 4 years ago. His smokeless tobacco use included Chew. He reports that he drinks about 0.6 oz of alcohol per week. He reports that he does not use illicit drugs.  ALLERGIES: Allergies  Allergen Reactions  . Ace Inhibitors Cough  . Lipitor [Atorvastatin] Other  (See Comments)    myalgia  . Crestor [Rosuvastatin] Other (See Comments)  . Zofran [Ondansetron Hcl] Nausea And Vomiting  . Codeine Itching    ROS: A comprehensive review of systems was negative except for: Neurological: positive for headaches  HOME MEDICATIONS:   Medication List    ASK your doctor about these medications        acetaminophen 500 MG tablet  Commonly known as:  TYLENOL  Take 1 tablet (500 mg total) by mouth every 4 (four) hours as needed for mild pain, fever or headache.     clopidogrel 75 MG tablet  Commonly known as:  PLAVIX  Take 1 tablet (75 mg total) by mouth daily with breakfast.     Evolocumab 140 MG/ML Soaj  Inject 140 mg into the skin every 14 (fourteen) days.     fenofibrate 160 MG tablet  Take 1 tablet (160 mg total) by mouth daily.     furosemide 20 MG tablet  Commonly known as:  LASIX  Take 1 tablet (20 mg total) by mouth daily.     KRILL OIL PO  Take 1 capsule by mouth daily.     MAGNESIUM PO  Take 1 tablet by mouth daily.     metoprolol succinate 100 MG 24 hr  tablet  Commonly known as:  TOPROL-XL  Take 1 tablet (100 mg total) by mouth daily.     nitroGLYCERIN 0.4 MG SL tablet  Commonly known as:  NITROSTAT  Place 1 tablet (0.4 mg total) under the tongue every 5 (five) minutes x 3 doses as needed for chest pain.     olmesartan 20 MG tablet  Commonly known as:  BENICAR  Take 0.5 tablets (10 mg total) by mouth daily.     ZETIA 10 MG tablet  Generic drug:  ezetimibe  Take 10 mg by mouth daily.        HOSPITAL MEDICATIONS: Prior to Admission:  Prescriptions prior to admission  Medication Sig Dispense Refill Last Dose  . clopidogrel (PLAVIX) 75 MG tablet Take 1 tablet (75 mg total) by mouth daily with breakfast. 90 tablet 1 05/12/2015 at Unknown time  . Evolocumab 140 MG/ML SOAJ Inject 140 mg into the skin every 14 (fourteen) days. 2 pen 11 Past Month at Unknown time  . furosemide (LASIX) 20 MG tablet Take 1 tablet (20 mg total)  by mouth daily. 90 tablet 1 05/12/2015 at Unknown time  . KRILL OIL PO Take 1 capsule by mouth daily.   05/12/2015 at Unknown time  . MAGNESIUM PO Take 1 tablet by mouth daily.   05/12/2015 at Unknown time  . metoprolol succinate (TOPROL-XL) 100 MG 24 hr tablet Take 1 tablet (100 mg total) by mouth daily. 30 tablet 5 05/13/2015 at 0330  . nitroGLYCERIN (NITROSTAT) 0.4 MG SL tablet Place 1 tablet (0.4 mg total) under the tongue every 5 (five) minutes x 3 doses as needed for chest pain. 25 tablet 2 05/13/2015 at Unknown time  . olmesartan (BENICAR) 20 MG tablet Take 0.5 tablets (10 mg total) by mouth daily. (Patient taking differently: Take 20 mg by mouth daily. ) 30 tablet 3 05/12/2015 at Unknown time  . ZETIA 10 MG tablet Take 10 mg by mouth daily.   1 05/12/2015 at Unknown time  . acetaminophen (TYLENOL) 500 MG tablet Take 1 tablet (500 mg total) by mouth every 4 (four) hours as needed for mild pain, fever or headache.   unknown  . fenofibrate 160 MG tablet Take 1 tablet (160 mg total) by mouth daily. (Patient not taking: Reported on 05/13/2015) 30 tablet 11 Not Taking at Unknown time    VITALS: Blood pressure 140/80, pulse 86, temperature 98.3 F (36.8 C), temperature source Oral, resp. rate 14, height _0  (1.88 m), weight 265 lb (120.203 kg), SpO2 100 %.  PHYSICAL EXAM: General appearance: alert and no distress Neck: no carotid bruit and no JVD Lungs: clear to auscultation bilaterally Heart: regular rate and rhythm, S1, S2 normal, no murmur, click, rub or gallop Abdomen: soft, non-tender; bowel sounds normal; no masses,  no organomegaly Extremities: extremities normal, atraumatic, no cyanosis or edema Pulses: 2+ and symmetric Skin: Skin color, texture, turgor normal. No rashes or lesions Neurologic: Grossly normal Psych: Mildly anxious  LABS: Results for orders placed or performed during the hospital encounter of 05/13/15 (from the past 48 hour(s))  I-stat troponin, ED     Status: None    Collection Time: 05/13/15  4:39 AM  Result Value Ref Range   Troponin i, poc 0.00 0.00 - 0.08 ng/mL   Comment 3            Comment: Due to the release kinetics of cTnI, a negative result within the first hours of the onset of symptoms does not rule out myocardial  infarction with certainty. If myocardial infarction is still suspected, repeat the test at appropriate intervals.   Basic metabolic panel     Status: Abnormal   Collection Time: 05/13/15  4:40 AM  Result Value Ref Range   Sodium 136 135 - 145 mmol/L   Potassium 3.5 3.5 - 5.1 mmol/L   Chloride 100 (L) 101 - 111 mmol/L   CO2 26 22 - 32 mmol/L   Glucose, Bld 120 (H) 65 - 99 mg/dL   BUN 19 6 - 20 mg/dL   Creatinine, Ser 0.99 0.61 - 1.24 mg/dL   Calcium 9.1 8.9 - 10.3 mg/dL   GFR calc non Af Amer >60 >60 mL/min   GFR calc Af Amer >60 >60 mL/min    Comment: (NOTE) The eGFR has been calculated using the CKD EPI equation. This calculation has not been validated in all clinical situations. eGFR's persistently <60 mL/min signify possible Chronic Kidney Disease.    Anion gap 10 5 - 15  CBC     Status: None   Collection Time: 05/13/15  4:40 AM  Result Value Ref Range   WBC 5.2 4.0 - 10.5 K/uL   RBC 4.75 4.22 - 5.81 MIL/uL   Hemoglobin 13.6 13.0 - 17.0 g/dL   HCT 40.7 39.0 - 52.0 %   MCV 85.7 78.0 - 100.0 fL   MCH 28.6 26.0 - 34.0 pg   MCHC 33.4 30.0 - 36.0 g/dL   RDW 13.3 11.5 - 15.5 %   Platelets 200 150 - 400 K/uL  Protime-INR - (order if Patient is taking Coumadin / Warfarin)     Status: None   Collection Time: 05/13/15  4:40 AM  Result Value Ref Range   Prothrombin Time 13.6 11.6 - 15.2 seconds   INR 1.02 0.00 - 1.49  Troponin I (q 6hr x 3)     Status: None   Collection Time: 05/13/15  5:52 AM  Result Value Ref Range   Troponin I <0.03 <0.031 ng/mL    Comment:        NO INDICATION OF MYOCARDIAL INJURY.   APTT     Status: None   Collection Time: 05/13/15  5:52 AM  Result Value Ref Range   aPTT 26 24 - 37  seconds  Urine rapid drug screen (hosp performed)     Status: None   Collection Time: 05/13/15  6:08 AM  Result Value Ref Range   Opiates NONE DETECTED NONE DETECTED   Cocaine NONE DETECTED NONE DETECTED   Benzodiazepines NONE DETECTED NONE DETECTED   Amphetamines NONE DETECTED NONE DETECTED   Tetrahydrocannabinol NONE DETECTED NONE DETECTED   Barbiturates NONE DETECTED NONE DETECTED    Comment:        DRUG SCREEN FOR MEDICAL PURPOSES ONLY.  IF CONFIRMATION IS NEEDED FOR ANY PURPOSE, NOTIFY LAB WITHIN 5 DAYS.        LOWEST DETECTABLE LIMITS FOR URINE DRUG SCREEN Drug Class       Cutoff (ng/mL) Amphetamine      1000 Barbiturate      200 Benzodiazepine   409 Tricyclics       811 Opiates          300 Cocaine          300 THC              50   Glucose, capillary     Status: Abnormal   Collection Time: 05/13/15  9:06 AM  Result Value Ref Range   Glucose-Capillary 121 (  H) 65 - 99 mg/dL   Comment 1 Notify RN     IMAGING: Dg Chest 2 View  05/13/2015   CLINICAL DATA:  Chest pain, shortness of breath.  EXAM: CHEST  2 VIEW  COMPARISON:  April 01, 2015.  FINDINGS: The heart size and mediastinal contours are within normal limits. Both lungs are clear. No pneumothorax or pleural effusion is noted. The visualized skeletal structures are unremarkable.  IMPRESSION: No active cardiopulmonary disease.   Electronically Signed   By: Marijo Conception, M.D.   On: 05/13/2015 07:57    HOSPITAL DIAGNOSES: Principal Problem:   Chest pain Active Problems:   CAD S/P PCI with 3 DES stents to RCA and PTCA x2 for ISR and thrombosis    H/O: CVA (cerebrovascular accident), post cardiac cath, minimal speech residual 11/11 post PCI   Essential hypertension   Visual loss, 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   Superior mesenteric vein thrombosis (HCC)   Coronary stent thrombosis   Hyperlipidemia with target LDL less  than 70   IMPRESSION: 1. Chest pain secondary to hypertensive urgency 2. Frequent PACs  RECOMMENDATION: 1. Mr. Olver is experiencing episodes of nocturnal chest pressure which is likely related to uncontrolled hypertension. He also has breakthrough PACs which are worse in the evenings. This may be due to recent changes in blood pressure medication and possibly could be related to waning effect of his long-acting metoprolol which he takes around 5:30 AM. He did have a sleep study and was not told that he has any significant sleep apnea. Cardiac workup is negative for acute coronary syndrome. There is no ongoing indication for heparin or nitroglycerin and I will discontinue those. He had an echocardiogram which showed normal systolic function in January 2016, therefore repeat echo is not necessary. I would recommend adjusting his blood pressure medication, including changing his Toprol-XL to 50 mg tablets twice a day. If he continues to have problems with nocturnal hypertension and palpitations, we could consider something such as low-dose clonidine at night.  Can be safely discharged home from a cardiology standpoint. I will notify Dr. Ellyn Hack to see if he can schedule follow-up in the next few weeks. He is actually due for recall appointment this month. Thanks for the consultation.  Time Spent Directly with Patient: 30 minutes  Pixie Casino, MD, [facility] Attending Cardiologist [place] 05/13/2015, 9:42 AM

## 2015-05-13 NOTE — ED Notes (Signed)
Pt states that he was watching tv sitting down last night around 2100 and began having chest pressure with sob, denies n/v dizziness. Pt has hx of 2 MI's. Pt took a nitro and relived CP and brought high blood pressure down. Then pt woke up at 0300 this morning with chest pressure again and blood pressure was 189/120, pt took another nitro and it has eased chest pressure some. BP here 118/71. Pt alert and oriented x 4. Pt's cardiologist is Glenetta Hew.

## 2015-05-13 NOTE — Progress Notes (Signed)
Pt states he took all medication at home this am. Pt refused meds this am.

## 2015-05-13 NOTE — Discharge Summary (Signed)
Physician Discharge Summary  Dylan [name] YQM:[id number] DOB: 1966-02-04 DOA: 05/13/2015  PCP: No PCP Per Patient  Admit date: 05/13/2015 Discharge date: 05/13/2015  Time spent: 60 minutes  Recommendations for Outpatient Follow-up:  1. Follow-up with Dr. Ellyn Hack, cardiology in 1-2 weeks.  Discharge Diagnoses:  Principal Problem:   Chest pain Active Problems:   CAD S/P PCI with 3 DES stents to RCA and PTCA x2 for ISR and thrombosis    H/O: CVA (cerebrovascular accident), post cardiac cath, minimal speech residual 11/11 post PCI   Essential hypertension   Visual loss, 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   Superior mesenteric vein thrombosis (HCC)   Coronary stent thrombosis   Hyperlipidemia with target LDL less than 70   Hypertensive urgency   Discharge Condition: Stable and improved  Diet recommendation: Heart healthy  Filed Weights   05/13/15 0500  Weight: 120.203 kg (265 lb)    History of present illness:  Per Dr Theresia Lo is a 49 y.o. male with PMH of hypertension, CAD, s/p stent, in-stent thrombosis, right eye vision loss, mesenteric vein thrombosis, stroke, migraine, who presented to ED with chest pressure.  Patient reported that he had one episode of chest pressure at about 9 PM, which resolved after took nitroglycerin. At about 3 AM, he was awaken up by chest pressure. This time, his chest pressure had been persistent. It is located in substernal area. It was associated with shortness of breath. He stated that his blood pressure was elevated at 189/120, which improved after he took nitroglycerin. No cough, fever or chills. He did not have abdominal pain, diarrhea, symptoms or UTI, unilateral weakness. He has history of right vision loss, which has not changed.  In ED, patient was found to have troponin negative, INR 1.02, temperature normal, no tachycardia,  electrolytes okay. EKG showed low voltage, no ischemic change. Patient was admitted to inpatient for further eval and treatment.   Hospital Course:  #1 chest pressure/history of coronary artery disease secondary to hypertensive urgency Patient was admitted with chest pressure that was not pleuritic in nature and unlikely to be secondary to pulmonary emboli. Patient's chest pressure was relieved by nitroglycerin. Patient was noted to have elevated blood pressure during his chest pain. Chest x-ray which was done was negative for any acute infiltrate. Patient was afebrile. Patient with a normal white count. Due to patient's extensive coronary artery disease including in-stent thrombosis, status post PCI with 3 drug-eluting stents to the RCA and PTCA 2 patient was admitted for further evaluation. Cardiac enzymes were cycled which were negative. EKG had no ischemic changes. Patient was placed empirically on a nitroglycerin and a heparin drip as well as continued on his Plavix metoprolol and as needed morphine. Patient was noted to be allergic to a statin and a such was continued on his home regimen of fenofibrate, Zetia,evolocumab. Patient's chest pain resolved. Cardiology was consulted and patient was seen in consultation by Dr. Debara Pickett. It was felt per cardiology that patient's nocturnal chest pressure was likely secondary to uncontrolled hypertension. Patient was also known to have breakthrough PACs which were worse in the evenings. It was felt patient's symptoms were likely related to waning effect of his long-acting metoprolol that he takes daily at 5:30 AM. As cardiac enzymes were negative and patient had no ongoing chest pain heparin and nitroglycerin drips were discontinued. Patient had a recent 2-D echo done  in January 2016 with a normal systolic function and a such repeat echo was not necessary. Was recommended per cardiology to change patient's Toprol-XL to 50 mg twice daily and patient to follow-up with  Dr. Ellyn Hack as outpatient in 1-2 weeks. Patient improved clinically and patient be discharged in stable and improved condition.  #2 history of stroke Stable. Patient was maintained on Plavix for secondary stroke prevention.  #3 hypertensive urgency Patient prior to admission while having this chest pressure was noted to have a hypertensive urgency that improved after nitroglycerin. Patient was maintained on his home regimen of Lasix, metoprolol dose was adjusted to 50 mg twice daily as well as Avapro. Outpatient follow-up.  #4 hyperlipidemia Patient was noted to have LDL of 118. Goal LDL for patient's postmeal less than 70. Patient is allergic to a statin. Patient was maintained on his home regimen of Zetia, fenofibrate, evolocumab. Outpatient follow-up.  The rest of patient's chronic medical issues remained stable throughout the hospitalization and patient discharged in stable and improved condition.  Procedures:  Chest x-ray 05/13/2015  Consultations:  Cardiology: Dr Debara Pickett  Discharge Exam: Filed Vitals:   05/13/15 0801  BP: 140/80  Pulse: 86  Temp: 98.3 F (36.8 C)  Resp:     General: NAD Cardiovascular: RRR Respiratory: CTAB  Discharge Instructions   Discharge Instructions    Diet - low sodium heart healthy    Complete by:  As directed      Discharge instructions    Complete by:  As directed   Follow up with Dr Ellyn Hack in 1-2 weeks.     Increase activity slowly    Complete by:  As directed           Current Discharge Medication List    CONTINUE these medications which have CHANGED   Details  metoprolol succinate (TOPROL-XL) 50 MG 24 hr tablet Take 1 tablet (50 mg total) by mouth 2 (two) times daily. Take with or immediately following a meal. Qty: 60 tablet, Refills: 0    olmesartan (BENICAR) 20 MG tablet Take 1 tablet (20 mg total) by mouth daily. Qty: 30 tablet, Refills: 3      CONTINUE these medications which have NOT CHANGED   Details  clopidogrel  (PLAVIX) 75 MG tablet Take 1 tablet (75 mg total) by mouth daily with breakfast. Qty: 90 tablet, Refills: 1    Evolocumab 140 MG/ML SOAJ Inject 140 mg into the skin every 14 (fourteen) days. Qty: 2 pen, Refills: 11    furosemide (LASIX) 20 MG tablet Take 1 tablet (20 mg total) by mouth daily. Qty: 90 tablet, Refills: 1    KRILL OIL PO Take 1 capsule by mouth daily.    MAGNESIUM PO Take 1 tablet by mouth daily.    nitroGLYCERIN (NITROSTAT) 0.4 MG SL tablet Place 1 tablet (0.4 mg total) under the tongue every 5 (five) minutes x 3 doses as needed for chest pain. Qty: 25 tablet, Refills: 2    ZETIA 10 MG tablet Take 10 mg by mouth daily.  Refills: 1    acetaminophen (TYLENOL) 500 MG tablet Take 1 tablet (500 mg total) by mouth every 4 (four) hours as needed for mild pain, fever or headache.    fenofibrate 160 MG tablet Take 1 tablet (160 mg total) by mouth daily. Qty: 30 tablet, Refills: 11       Allergies  Allergen Reactions  . Ace Inhibitors Cough  . Lipitor [Atorvastatin] Other (See Comments)    myalgia  . Crestor [  Rosuvastatin] Other (See Comments)  . Zofran [Ondansetron Hcl] Nausea And Vomiting  . Codeine Itching   Follow-up Information    Follow up with HARDING, DAVID W, MD. Schedule an appointment as soon as possible for a visit in 2 weeks.   Specialty:  Cardiology   Why:  f/u in 1-2 weeks   Contact information:   [address] [place] [id number] [phone]        The results of significant diagnostics from this hospitalization (including imaging, microbiology, ancillary and laboratory) are listed below for reference.    Significant Diagnostic Studies: Dg Chest 2 View  05/13/2015   CLINICAL DATA:  Chest pain, shortness of breath.  EXAM: CHEST  2 VIEW  COMPARISON:  April 01, 2015.  FINDINGS: The heart size and mediastinal contours are within normal limits. Both lungs are clear. No pneumothorax or pleural effusion is noted. The visualized  skeletal structures are unremarkable.  IMPRESSION: No active cardiopulmonary disease.   Electronically Signed   By: Marijo Conception, M.D.   On: 05/13/2015 07:57    Microbiology: No results found for this or any previous visit (from the past 240 hour(s)).   Labs: Basic Metabolic Panel:  Recent Labs Lab 05/13/15 0440  NA 136  K 3.5  CL 100*  CO2 26  GLUCOSE 120*  BUN 19  CREATININE 0.99  CALCIUM 9.1   Liver Function Tests: No results for input(s): AST, ALT, ALKPHOS, BILITOT, PROT, ALBUMIN in the last 168 hours. No results for input(s): LIPASE, AMYLASE in the last 168 hours. No results for input(s): AMMONIA in the last 168 hours. CBC:  Recent Labs Lab 05/13/15 0440  WBC 5.2  HGB 13.6  HCT 40.7  MCV 85.7  PLT 200   Cardiac Enzymes:  Recent Labs Lab 05/13/15 0552  TROPONINI <0.03   BNP: BNP (last 3 results)  Recent Labs  10/08/14 1215  BNP 42.6    ProBNP (last 3 results) No results for input(s): PROBNP in the last 8760 hours.  CBG:  Recent Labs Lab 05/13/15 0906  GLUCAP 121*       Signed:  Prajwal Fellner MD [facility] 05/13/2015, 11:24 AM

## 2015-05-13 NOTE — Progress Notes (Signed)
ANTICOAGULATION CONSULT NOTE - Initial Consult  Pharmacy Consult for heparin Indication: chest pain/ACS  Allergies  Allergen Reactions  . Ace Inhibitors Cough  . Lipitor [Atorvastatin] Other (See Comments)    myalgia  . Crestor [Rosuvastatin] Other (See Comments)  . Zofran [Ondansetron Hcl] Nausea And Vomiting  . Codeine Itching    Patient Measurements: Height: 6\' 1"  (185.4 cm) Weight: 265 lb (120.203 kg) IBW/kg (Calculated) : 79.9 Heparin Dosing Weight: 106kg  Vital Signs: Temp: 98.1 F (36.7 C) (10/09 0428) Temp Source: Oral (10/09 0428) BP: 123/78 mmHg (10/09 0428) Pulse Rate: 77 (10/09 0428)  Labs:  Recent Labs  05/13/15 0440  HGB 13.6  HCT 40.7  PLT 200  LABPROT 13.6  INR 1.02    CrCl cannot be calculated (Patient has no serum creatinine result on file.).   Medical History: Past Medical History  Diagnosis Date  . CAD S/P percutaneous coronary angioplasty 07/2010; 07/2012; 08/2014    1) 2/'11: MI - 100% RCA - PCI (3 Taxus ION DES 3.0 x 28, 3.0 x 24 & 3.5 x 12 distal-prox); b) ISR of RCA stent -AnngioSculpt PTCA; c) NSTEMI 1/'16: mild ISR with Thrombosis of RCA Stents (in setting of Mesenteric Vein Thrombosis) - Cutting Balloon PTCA  . NSTEMI (non-ST elevated myocardial infarction) ([place]) 07/2010, 08/2014    And unstable angina in February 20 13th; Echo 1/12/'16: Moderate concentric LVH. EF 60-65% with no regional WMA. Gr 1 DD, otherwise normal  . Stroke ([place]) 2011; 2013    S/P cardiac cath - embolic stroke; denies residual (07/06/2012)  . 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 04/19/2013    No evidence of CVA on MRI/MRA & Dliated Eye Examination. ruptured blood vessel & not occlusion.  . Coronary stent thrombosis 08/15/2014  . Hyperlipidemia with target LDL less than 70 09/01/2014  . Mesenteric vein thrombosis   . Essential hypertension 07/07/2012  . Migraines     "maybe one/yr" (07/06/2012)    Medications:  Infusions:  . heparin    . nitroGLYCERIN      Assessment: 32 yom presented to the ED with CP and SOB. To start IV heparin for anticoagulation. He has been on heparin in the past and required very high doses so will start aggressively. Baseline CBC is WNL. He is not on any anticoagulation PTA except for plavix.   Goal of Therapy:  Heparin level 0.3-0.7 units/ml Monitor platelets by anticoagulation protocol: Yes   Plan:  - Heparin bolus 4000 units IV x 1 - Heparin gtt 2000 units/hr (aggressive dosing based on high dosing requirements in January) - Check a 6 hour heparin level  - Daily heparin level and CBC  Thelma Lorenzetti, Rande Lawman 05/13/2015,5:32 AM

## 2015-05-13 NOTE — Progress Notes (Signed)
PT Cancellation Note  Patient Details Name: Dylan [name] MRN: [id number] DOB: 10/18/1965   Cancelled Treatment:    Reason Eval/Treat Not Completed: PT screened, no needs identified, will sign off.  Patient reports he has been ambulating in room and has no issues/concerns.     Shanna Cisco 05/13/2015, 10:39 AM

## 2015-05-13 NOTE — H&P (Signed)
[facility] History and Physical  TERIK HAUGHEY MEQ:[id number] DOB: 1966/07/10 DOA: 05/13/2015  Referring physician: ED physician PCP: No PCP Per Patient  Specialists:   Chief Complaint: Chest pressure  HPI: Dylan [name] is a 49 y.o. male with PMH of hypertension, CAD, s/p stent, in-stent thrombosis, right eye vision loss, mesenteric vein thrombosis, stroke, migraine, who presents with chest pressure.  Patient reports that he had one episode of chest pressure at about 9 PM, which resolved after took nitroglycerin. At about 3 AM, he was waken up by chest pressure. This time, his chest pressure has been persistent. It is located in substernal area. It is associated with shortness of breath. He states that his blood pressure was elevated at 189/120, which improved after he took nitroglycerin. No cough, fever or chills. He does not have abdominal pain, diarrhea, symptoms or UTI, unilateral weakness. He has history of right vision loss, which has not changed.  In ED, patient was found to have troponin negative, INR 1.02, temperature normal, no tachycardia, electrolytes okay. EKG showed low voltage, no ischemic change. Patient is admitted to inpatient for further eval and treatment.  Where does patient live?   At home   Can patient participate in ADLs?  Yes   Review of Systems:   General: no fevers, chills, no changes in body weight, has fatigue HEENT: has vision loss in R eye. No hearing changes or sore throat Pulm: has dyspnea, no coughing, wheezing CV: has chest pressure, palpitations Abd: no nausea, vomiting, abdominal pain, diarrhea, constipation GU: no dysuria, burning on urination, increased urinary frequency, hematuria  Ext: no leg edema Neuro: no unilateral weakness, numbness, or tingling, no vision change or hearing loss Skin: no rash MSK: No muscle spasm, no deformity, no limitation of range of movement in spin Heme: No easy bruising.  Travel history: No recent  long distant travel.  Allergy:  Allergies  Allergen Reactions  . Ace Inhibitors Cough  . Lipitor [Atorvastatin] Other (See Comments)    myalgia  . Crestor [Rosuvastatin] Other (See Comments)  . Zofran [Ondansetron Hcl] Nausea And Vomiting  . Codeine Itching    Past Medical History  Diagnosis Date  . CAD S/P percutaneous coronary angioplasty 07/2010; 07/2012; 08/2014    1) 2/'11: MI - 100% RCA - PCI (3 Taxus ION DES 3.0 x 28, 3.0 x 24 & 3.5 x 12 distal-prox); b) ISR of RCA stent -AnngioSculpt PTCA; c) NSTEMI 1/'16: mild ISR with Thrombosis of RCA Stents (in setting of Mesenteric Vein Thrombosis) - Cutting Balloon PTCA  . NSTEMI (non-ST elevated myocardial infarction) ([place]) 07/2010, 08/2014    And unstable angina in February 20 13th; Echo 1/12/'16: Moderate concentric LVH. EF 60-65% with no regional WMA. Gr 1 DD, otherwise normal  . Stroke ([place]) 2011; 2013    S/P cardiac cath - embolic stroke; denies residual (07/06/2012)  . 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 04/19/2013    No evidence of CVA on MRI/MRA & Dliated Eye Examination. ruptured blood vessel & not occlusion.  . Coronary stent thrombosis 08/15/2014  . Hyperlipidemia with target LDL less than 70 09/01/2014  . Mesenteric vein thrombosis   . Essential hypertension 07/07/2012  . Migraines     "maybe one/yr" (07/06/2012)    Past Surgical History  Procedure Laterality Date  . Septoplasty  2001  . Coronary angioplasty with stent placement  07/2010  inferior wall MI - 3 Taxus Ion DES (3.0x4mm, 3.0x77mm, 3.5x52mm) to prox and mid RCA  . Coronary angioplasty  07/06/2012; 08/15/2014    a) 12/'13: PTCA of 80% ISR mRCA - AngioSculpt; b) PTCA of  ISR/thrombosis  . Cardiac catheterization  04/18/2013  . Left heart catheterization with coronary angiogram N/A 06/11/2012    Procedure: LEFT HEART CATHETERIZATION WITH CORONARY ANGIOGRAM;   Surgeon: [name], MD;  Location: [facility] CATH LAB;  Service: Cardiovascular;  Laterality: N/A;  . Percutaneous coronary stent intervention (pci-s) N/A 07/06/2012    Procedure: PERCUTANEOUS CORONARY STENT INTERVENTION (PCI-S);  Surgeon: [name], MD;  Location: [facility] CATH LAB;  Service: Cardiovascular;  Laterality: N/A;  . Left heart catheterization with coronary angiogram N/A 04/18/2013    Procedure: LEFT HEART CATHETERIZATION WITH CORONARY ANGIOGRAM;  Surgeon: [name], MD;  Location: [facility] CATH LAB;  Service: Cardiovascular;  Laterality: N/A;  . Left heart catheterization with coronary angiogram N/A 08/15/2014    Procedure: LEFT HEART CATHETERIZATION WITH CORONARY ANGIOGRAM;  Surgeon: [name], MD;  Location: [facility] CATH LAB;  Service: Cardiovascular;  Laterality: N/A;  . Laparoscopic appendectomy N/A 08/28/2014    Procedure: APPENDECTOMY LAPAROSCOPIC;  Surgeon: [name], MD;  Location: [employer];  Service: General;  Laterality: N/A;  . Bowel resection N/A 08/28/2014    Procedure: SMALL BOWEL RESECTION;  Surgeon: [name], MD;  Location: [facility];  Service: General;  Laterality: N/A;  . Laparoscopy  08/28/2014    Procedure: LAPAROSCOPY DIAGNOSTIC;  Surgeon: [name], MD;  Location: [place];  Service: General;;  . Transthoracic echocardiogram  08/15/2014    Moderate concentric LVH. EF 60-65% with no regional WMA. Gr 1 DD, otherwise normal    Social History:  reports that he quit smoking about 4 years ago. His smoking use included Cigarettes. He has a 15 pack-year smoking history. He quit smokeless tobacco use about 4 years ago. His smokeless tobacco use included Chew. He reports that he drinks about 0.6 oz of alcohol per week. He reports that he does not use illicit drugs.  Family History:  Family History  Problem Relation Age of Onset  . Atrial fibrillation Mother   . Mitral valve prolapse Mother   . Coronary artery disease Father     CABG, aortic aneursym,      Prior to Admission medications   Medication Sig Start Date End Date Taking? Authorizing Provider  clopidogrel (PLAVIX) 75 MG tablet Take 1 tablet (75 mg total) by mouth daily with breakfast. 03/30/15  Yes [name], MD  Evolocumab 140 MG/ML SOAJ Inject 140 mg into the skin every 14 (fourteen) days. 04/25/15  Yes [name], MD  furosemide (LASIX) 20 MG tablet Take 1 tablet (20 mg total) by mouth daily. 04/11/15  Yes [name], MD  KRILL OIL PO Take 1 capsule by mouth daily.   Yes Historical Provider, MD  MAGNESIUM PO Take 1 tablet by mouth daily.   Yes Historical Provider, MD  metoprolol succinate (TOPROL-XL) 100 MG 24 hr tablet Take 1 tablet (100 mg total) by mouth daily. 04/02/15  Yes [name], MD  nitroGLYCERIN (NITROSTAT) 0.4 MG SL tablet Place 1 tablet (0.4 mg total) under the tongue every 5 (five) minutes x 3 doses as needed for chest pain. 07/07/12  Yes Luke K Kilroy, PA-C  olmesartan (BENICAR) 20 MG tablet Take 0.5 tablets (10 mg total) by mouth daily. Patient taking differently: Take 20 mg by mouth daily.  05/07/15  Yes [name], MD  ZETIA 10 MG tablet Take 10 mg by mouth daily.  08/17/14  Yes Historical Provider, MD  acetaminophen (TYLENOL) 500 MG tablet Take 1 tablet (500 mg total) by mouth every 4 (four) hours as needed for mild pain, fever or headache. 08/16/14   Cherene Altes, MD  fenofibrate 160 MG tablet Take 1 tablet (160 mg total) by mouth daily. Patient not taking: Reported on 05/13/2015 09/18/14   [name], MD    Physical Exam: Filed Vitals:   05/13/15 0428 05/13/15 0500  BP: 123/78   Pulse: 77   Temp: 98.1 F (36.7 C)   TempSrc: Oral   Resp: 13   Height:  6\' 1"  (1.854 m)  Weight:  120.203 kg (265 lb)  SpO2: 95%    General: Not in acute distress HEENT:       Eyes: PERRL, EOMI, no scleral icterus.       ENT: No discharge from the ears and nose, no pharynx injection, no tonsillar enlargement.        Neck: No JVD, no bruit, no mass  felt. Heme: No neck lymph node enlargement. Cardiac: S1/S2, RRR, No murmurs, No gallops or rubs. Pulm: No rales, wheezing, rhonchi or rubs. Abd: Soft, nondistended, nontender, no rebound pain, no organomegaly, BS present. Ext: No pitting leg edema bilaterally. 2+DP/PT pulse bilaterally. Musculoskeletal: No joint deformities, No joint redness or warmth, no limitation of ROM in spin. Skin: No rashes.  Neuro: Alert, oriented X3, cranial nerves II-XII grossly intact, muscle strength 5/5 in all extremities, sensation to light touch intact.  Psych: Patient is not psychotic, no suicidal or hemocidal ideation.  Labs on Admission:  Basic Metabolic Panel:  Recent Labs Lab 05/13/15 0440  NA 136  K 3.5  CL 100*  CO2 26  GLUCOSE 120*  BUN 19  CREATININE 0.99  CALCIUM 9.1   Liver Function Tests: No results for input(s): AST, ALT, ALKPHOS, BILITOT, PROT, ALBUMIN in the last 168 hours. No results for input(s): LIPASE, AMYLASE in the last 168 hours. No results for input(s): AMMONIA in the last 168 hours. CBC:  Recent Labs Lab 05/13/15 0440  WBC 5.2  HGB 13.6  HCT 40.7  MCV 85.7  PLT 200   Cardiac Enzymes: No results for input(s): CKTOTAL, CKMB, CKMBINDEX, TROPONINI in the last 168 hours.  BNP (last 3 results)  Recent Labs  10/08/14 1215  BNP 42.6    ProBNP (last 3 results) No results for input(s): PROBNP in the last 8760 hours.  CBG: No results for input(s): GLUCAP in the last 168 hours.  Radiological Exams on Admission: No results found.  EKG: Independently reviewed.  Abnormal findings:   Low voltage, No ischemia   Assessment/Plan Principal Problem:   Chest pain Active Problems:   CAD S/P PCI with 3 DES stents to RCA and PTCA x2 for ISR and thrombosis    H/O: CVA (cerebrovascular accident), post cardiac cath, minimal speech residual 11/11 post PCI   Essential hypertension   Visual loss, right eye - Micro-rupture of Retinal Artery Branch -- No evidence of embolic  event on MRI or dilated Eye Exam by Opthalmology.   Superior mesenteric vein thrombosis (HCC)   Coronary stent thrombosis   Hyperlipidemia with target LDL less than 70   Chest pressure and CAD:  No signs of infection for PNA. CP is not pleurtic, less likely due to PE. He has very significant hx of CAD including hx of in stent thrombosis. S/P  PCI with 3 DES stents to RCA and PTCA x2 for ISR. Now has on-going chest pressure, concerning for ACS.  - will admit to SDU - will admit to Tele bed  - cycle CE q6 x3 and repeat EKG in the am  - Nitroglycerin gtt and heparin gtt were started by Ed, will continue - prn morphine, and plavix, metoprolol - pt said that he should not take ASA per his cardiologist. - patient is allergic to statin. On Zetia, Evolocumab and fenofibrate - 2d echo - f/u CXR which is ordered by ED - Please call card in AM  Hx of stroke: - On plavix  Essential hypertension: -continue lasix, metoprolol and irbesartan  HLD: Last LDL was 118, not at goal (<70). Patient is allergic to statin. He is taking Zetia, Evolocumab and fenofibrate -Continue home medications:   DVT ppx: on IV Heparin   Code Status: Full code Family Communication: None at bed side.   Disposition Plan: Admit to inpatient   Date of Service 05/13/2015    Ivor Costa [facility] Pager [phone]  If 7PM-7AM, please contact night-coverage [email or web address] Password [email or web address] 05/13/2015, 5:59 AM

## 2015-07-12 ENCOUNTER — Ambulatory Visit: Payer: Self-pay | Admitting: Physician Assistant

## 2015-07-12 ENCOUNTER — Emergency Department (HOSPITAL_COMMUNITY)
Admission: EM | Admit: 2015-07-12 | Discharge: 2015-07-12 | Discharge status: Against Medical Advice | Payer: BLUE CROSS/BLUE SHIELD | Attending: Emergency Medicine | Admitting: Emergency Medicine

## 2015-07-12 ENCOUNTER — Encounter (HOSPITAL_COMMUNITY): Payer: Self-pay | Admitting: Emergency Medicine

## 2015-07-12 ENCOUNTER — Telehealth: Payer: Self-pay | Admitting: Cardiology

## 2015-07-12 ENCOUNTER — Emergency Department (HOSPITAL_COMMUNITY): Payer: BLUE CROSS/BLUE SHIELD

## 2015-07-12 DIAGNOSIS — I10 Essential (primary) hypertension: Secondary | ICD-10-CM | POA: Diagnosis not present

## 2015-07-12 DIAGNOSIS — R61 Generalized hyperhidrosis: Secondary | ICD-10-CM | POA: Insufficient documentation

## 2015-07-12 DIAGNOSIS — I2510 Atherosclerotic heart disease of native coronary artery without angina pectoris: Secondary | ICD-10-CM | POA: Insufficient documentation

## 2015-07-12 DIAGNOSIS — R079 Chest pain, unspecified: Secondary | ICD-10-CM | POA: Insufficient documentation

## 2015-07-12 DIAGNOSIS — I252 Old myocardial infarction: Secondary | ICD-10-CM | POA: Diagnosis not present

## 2015-07-12 LAB — BASIC METABOLIC PANEL
ANION GAP: 7 (ref 5–15)
BUN: 17 mg/dL (ref 6–20)
CALCIUM: 9.6 mg/dL (ref 8.9–10.3)
CO2: 30 mmol/L (ref 22–32)
CREATININE: 1.16 mg/dL (ref 0.61–1.24)
Chloride: 106 mmol/L (ref 101–111)
GLUCOSE: 143 mg/dL — AB (ref 65–99)
POTASSIUM: 4 mmol/L (ref 3.5–5.1)
SODIUM: 143 mmol/L (ref 135–145)

## 2015-07-12 LAB — CBC
HCT: 40.9 % (ref 39.0–52.0)
Hemoglobin: 13.7 g/dL (ref 13.0–17.0)
MCH: 29.3 pg (ref 26.0–34.0)
MCHC: 33.5 g/dL (ref 30.0–36.0)
MCV: 87.6 fL (ref 78.0–100.0)
Platelets: 215 10*3/uL (ref 150–400)
RBC: 4.67 MIL/uL (ref 4.22–5.81)
RDW: 13.7 % (ref 11.5–15.5)
WBC: 6.3 10*3/uL (ref 4.0–10.5)

## 2015-07-12 LAB — I-STAT TROPONIN, ED: TROPONIN I, POC: 0 ng/mL (ref 0.00–0.08)

## 2015-07-12 NOTE — ED Notes (Signed)
Pt stated that he was going to leave. This EMT explained the risks of leaving and pt voiced understanding.

## 2015-07-12 NOTE — Telephone Encounter (Signed)
Patient called c/o having PVC's/PAC's more frequently over the last couple of day stating he could feel every one of them Denies any history of AFib  Taking his Toprol 50 mg twice a day Patient wanted to come to office for EKG  Discussed with Doreene Burke PA and he recommended patient just using extra 1/2 daily as needed Did advise patient to avoid alcohol and caffeine  Only new medication he has started since seen last by Dr Ellyn Hack is Repatha  Scheduled EKG for patient next week when Dr Ellyn Hack in the office. Patient could only come after 3:00 pm so unable to give him appointment with Suanne Marker Tuesday am when [facility] in office  Advised patient would for to Dr Ellyn Hack for review

## 2015-07-12 NOTE — ED Notes (Signed)
Pt states three days ago while just sitting at work he had a sudden onset of substernal chest pressure. That was gotten worse over the last 3 days. Pt also states he's had episodes of diaphoresis while just sitting he will get clammy and break out into a sweat. Pt is warm and dry at this time. Pts pain was 6/10 45 minutes ago he took 1 nitro tablet and now pain is 3/10 in center of chest.

## 2015-07-12 NOTE — Telephone Encounter (Signed)
Pt called in stating that he has been having PAC's more that usual and he would like to possibly come in and have an EKG to diagnosis if this is something he has to go to the ER for. Please f/u with him  Thanks

## 2015-07-18 ENCOUNTER — Other Ambulatory Visit (HOSPITAL_COMMUNITY): Payer: Self-pay | Admitting: Nurse Practitioner

## 2015-07-19 ENCOUNTER — Other Ambulatory Visit: Payer: Self-pay | Admitting: *Deleted

## 2015-07-19 ENCOUNTER — Encounter: Payer: Self-pay | Admitting: Physician Assistant

## 2015-07-19 ENCOUNTER — Ambulatory Visit (INDEPENDENT_AMBULATORY_CARE_PROVIDER_SITE_OTHER): Payer: BLUE CROSS/BLUE SHIELD | Admitting: Physician Assistant

## 2015-07-19 ENCOUNTER — Encounter: Payer: Self-pay | Admitting: Cardiology

## 2015-07-19 VITALS — BP 150/100 | HR 75 | Ht 74.0 in | Wt 288.5 lb

## 2015-07-19 DIAGNOSIS — D689 Coagulation defect, unspecified: Secondary | ICD-10-CM

## 2015-07-19 DIAGNOSIS — I10 Essential (primary) hypertension: Secondary | ICD-10-CM

## 2015-07-19 DIAGNOSIS — I25709 Atherosclerosis of coronary artery bypass graft(s), unspecified, with unspecified angina pectoris: Secondary | ICD-10-CM | POA: Diagnosis not present

## 2015-07-19 DIAGNOSIS — R072 Precordial pain: Secondary | ICD-10-CM | POA: Diagnosis not present

## 2015-07-19 DIAGNOSIS — E785 Hyperlipidemia, unspecified: Secondary | ICD-10-CM

## 2015-07-19 DIAGNOSIS — Z01818 Encounter for other preprocedural examination: Secondary | ICD-10-CM

## 2015-07-19 DIAGNOSIS — Z9861 Coronary angioplasty status: Secondary | ICD-10-CM

## 2015-07-19 DIAGNOSIS — E669 Obesity, unspecified: Secondary | ICD-10-CM

## 2015-07-19 DIAGNOSIS — I2510 Atherosclerotic heart disease of native coronary artery without angina pectoris: Secondary | ICD-10-CM

## 2015-07-19 DIAGNOSIS — I200 Unstable angina: Secondary | ICD-10-CM

## 2015-07-19 MED ORDER — ISOSORBIDE MONONITRATE ER 30 MG PO TB24: 30.0000 mg | ORAL_TABLET | Freq: Every day | ORAL | Status: DC

## 2015-07-19 NOTE — Assessment & Plan Note (Addendum)
Patient presents with 2 weeks of progressively worsening chest pain. Today when he was in the office was 4/10.  It radiates to his back. Is also associated with diaphoresis. I had him take one of his sublingual nitroglycerin's and within a minute it was resolved. He's also noticed progressively worsening fatigue and shortness of breath.  He's had multiple catheterizations in the last 5 years with in-stent restenosis each time. Last time was 08/15/2014 and that revealed severe in-stent thrombosis of the distal portion of the significant stented segment of the RCA. He underwent successful PTCA only of the in-stent segments in the mid RCA with minimal residual thrombus present, but TIMI-3 flow.   The patient be scheduled for left heart catheterization tomorrow .  I will also prescribed him 30 mg of Imdur to start taking tonight. His blood pressure is adequate.  Continue aspirin, Plavix, Toprol.    I suspect he probably needs extra Lasix low is not complaining of orthopnea or PND.  We will find out what the  LVEDP is tomorrow

## 2015-07-19 NOTE — Assessment & Plan Note (Signed)
Continue Repatha.  Recheck lipids tomorrow.

## 2015-07-19 NOTE — Assessment & Plan Note (Signed)
Patient's weight continues to go up. Was 265 pounds back in October it is now 66.  Suspect some this is fluid as he has 1+ lower extremity edema. But after talking to him, he is not real careful with his diet.  We discussed low carbohydrate diet.

## 2015-07-19 NOTE — Progress Notes (Signed)
Patient ID: Dylan [name], male   DOB: 05-05-1966, 50 y.o.   MRN: [id number]    Date:  07/19/2015   ID:  Dylan [name], DOB October 06, 1965, MRN [id number]  PCP:  No PCP Per Patient  Primary Cardiologist:  Ellyn Hack   Chief Complaint  Patient presents with  . Chest Pain     History of Present Illness:  Dylan [name] is a 49 y.o. male with a PMH below who presents with complaints of chest pain.  The past 2 weeks he's been having chest pain that radiates to his back. It is similar in presentation to previous MIs however, not as severe.  He has associated diaphoresis and shortness of breath.  He also reports for the last 2 months getting progressively more tired and sob when he exerts himself.  He currently denies nausea, vomiting, fever, orthopnea, dizziness, PND, cough, congestion, abdominal pain, hematochezia, melena, claudication.  His history so includes mesenteric vein, stroke, perforated small intestine, thrombosis, hypertension, dyslipidemia as well as being a former smoker.  He was started on repatha approximately 2 months.   After December 2013 where he had PTCA to in-stent restenosis, he was lost to followup.   Cardiac History:  November 2011: non- STEMI: 100% occluded RCA after conus branch --> Following initial angioplasty there was a long area of disease that required extensive PCI with multiple drug-eluting stents Taxs ION 3.0-3.5 mm stents).  December 2013: Unstable Angina --> Focal 75% ISR in mid RCA stent overlapped segment --> Angiosculpt PTCA  January 2016: Shortly after starting testosterone injections he presented with mesenteric vein thrombosis treated with thrombectomy. This was complicated by mild non-STEMI --> Was found to have diffuse in-stent thrombosis with no significant stenosis. PTCA was performed with disruption of thrombosis, but with residual thrombus noted.  After the initial hospitalization with mesenteric vein thrombectomy and RCA PTCA, he then  presented again with acute abdominal pain was found to have a perforated small bowel that failed initial medical management. He subsequently underwent exploratory laparoscopy followed by partial bowel resection. He was maintained on IV heparin in the perioperative period and switched back to Pradaxa plus aspirin and Plavix.  He no longer takes Pradaxa   Wt Readings from Last 3 Encounters:  07/19/15 288 lb 8 oz (130.863 kg)  07/12/15 275 lb (124.739 kg)  05/13/15 265 lb (120.203 kg)     Past Medical History  Diagnosis Date  . CAD S/P percutaneous coronary angioplasty 07/2010; 07/2012; 08/2014    1) 2/'11: MI - 100% RCA - PCI (3 Taxus ION DES 3.0 x 28, 3.0 x 24 & 3.5 x 12 distal-prox); b) ISR of RCA stent -AnngioSculpt PTCA; c) NSTEMI 1/'16: mild ISR with Thrombosis of RCA Stents (in setting of Mesenteric Vein Thrombosis) - Cutting Balloon PTCA  . NSTEMI (non-ST elevated myocardial infarction) ([place]) 07/2010, 08/2014    And unstable angina in February 20 13th; Echo 1/12/'16: Moderate concentric LVH. EF 60-65% with no regional WMA. Gr 1 DD, otherwise normal  . Stroke ([place]) 2011; 2013    S/P cardiac cath - embolic stroke; denies residual (07/06/2012)  . 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 04/19/2013    No evidence of CVA on MRI/MRA & Dliated Eye Examination. ruptured blood vessel & not occlusion.  . Coronary stent thrombosis 08/15/2014  . Hyperlipidemia with target LDL less than 70 09/01/2014  . Mesenteric vein thrombosis   .  Essential hypertension 07/07/2012  . Migraines     "maybe one/yr" (07/06/2012)    Current Outpatient Prescriptions  Medication Sig Dispense Refill  . acetaminophen (TYLENOL) 500 MG tablet Take 1 tablet (500 mg total) by mouth every 4 (four) hours as needed for mild pain, fever or headache.    Marland Kitchen BENICAR 20 MG tablet TAKE 1 TABLET(20 MG) BY MOUTH DAILY 30 tablet 3  .  clopidogrel (PLAVIX) 75 MG tablet Take 1 tablet (75 mg total) by mouth daily with breakfast. 90 tablet 1  . Evolocumab 140 MG/ML SOAJ Inject 140 mg into the skin every 14 (fourteen) days. 2 pen 11  . furosemide (LASIX) 20 MG tablet Take 1 tablet (20 mg total) by mouth daily. 90 tablet 1  . KRILL OIL PO Take 1 capsule by mouth daily.    Marland Kitchen MAGNESIUM PO Take 1 tablet by mouth daily.    . metoprolol succinate (TOPROL-XL) 50 MG 24 hr tablet Take 1 tablet (50 mg total) by mouth 2 (two) times daily. Take with or immediately following a meal. 60 tablet 0  . nitroGLYCERIN (NITROSTAT) 0.4 MG SL tablet Place 1 tablet (0.4 mg total) under the tongue every 5 (five) minutes x 3 doses as needed for chest pain. 25 tablet 2  . olmesartan (BENICAR) 20 MG tablet Take 1 tablet (20 mg total) by mouth daily. 30 tablet 3  . ZETIA 10 MG tablet Take 10 mg by mouth daily.   1  . fenofibrate 160 MG tablet Take 1 tablet (160 mg total) by mouth daily. (Patient not taking: Reported on 07/19/2015) 30 tablet 11  . isosorbide mononitrate (IMDUR) 30 MG 24 hr tablet Take 1 tablet (30 mg total) by mouth daily. 30 tablet 3   No current facility-administered medications for this visit.    Allergies:    Allergies  Allergen Reactions  . Ace Inhibitors Cough  . Lipitor [Atorvastatin] Other (See Comments)    myalgia  . Crestor [Rosuvastatin] Other (See Comments)  . Zofran [Ondansetron Hcl] Nausea And Vomiting  . Codeine Itching    Social History:  The patient  reports that he quit smoking about 5 years ago. His smoking use included Cigarettes. He has a 15 pack-year smoking history. He quit smokeless tobacco use about 5 years ago. His smokeless tobacco use included Chew. He reports that he drinks about 0.6 oz of alcohol per week. He reports that he does not use illicit drugs.   Family history:   Family History  Problem Relation Age of Onset  . Atrial fibrillation Mother   . Mitral valve prolapse Mother   . Coronary artery  disease Father     CABG, aortic aneursym,    ROS:  Please see the history of present illness.  All other systems reviewed and negative.   PHYSICAL EXAM: VS:  BP 150/100 mmHg  Pulse 75  Ht 6\' 2"  (1.88 m)  Wt 288 lb 8 oz (130.863 kg)  BMI 37.03 kg/m2 obese, well developed, in no acute distress HEENT: Pupils are equal round react to light accommodation extraocular movements are intact.  Neck: no JVDNo cervical lymphadenopathy. Cardiac: Regularly irregular rhythm  controlledwithout murmurs rubs or gallops. Lungs:  clear to auscultation bilaterally, no wheezing, rhonchi or rales Abd: soft, nontender, positive bowel sounds all quadrants, no hepatosplenomegaly Ext: 1+ lower extremity edema.  2+ radial and dorsalis pedis pulses. Skin: warm and dry Neuro:  Grossly normal  EKG:  Sinus rhythm with PACs rate 75 bpm  Lipid Panel  Component Value Date/Time   CHOL 179 04/02/2015 1029   TRIG 132 04/02/2015 1029   HDL 35* 04/02/2015 1029   CHOLHDL 5.1* 04/02/2015 1029   VLDL 26 04/02/2015 1029   LDLCALC 118 04/02/2015 1029     ASSESSMENT AND PLAN:  Problem List Items Addressed This Visit    Unstable angina ([place]) - Primary    Patient presents with 2 weeks of progressively worsening chest pain. Today when he was in the office was 4/10.  It radiates to his back. Is also associated with diaphoresis. I had him take one of his sublingual nitroglycerin's and within a minute it was resolved. He's also noticed progressively worsening fatigue and shortness of breath.  He's had multiple catheterizations in the last 5 years with in-stent restenosis each time. Last time was 08/15/2014 and that revealed severe in-stent thrombosis of the distal portion of the significant stented segment of the RCA. He underwent successful PTCA only of the in-stent segments in the mid RCA with minimal residual thrombus present, but TIMI-3 flow.   The patient be scheduled for left heart catheterization tomorrow .  I will  also prescribed him 30 mg of Imdur to start taking tonight. His blood pressure is adequate.  Continue aspirin, Plavix, Toprol.    I suspect he probably needs extra Lasix low is not complaining of orthopnea or PND.  We will find out what the  LVEDP is tomorrow      Relevant Medications   isosorbide mononitrate (IMDUR) 30 MG 24 hr tablet   Obesity (BMI 30-39.9) (Chronic)    Patient's weight continues to go up. Was 265 pounds back in October it is now 18.  Suspect some this is fluid as he has 1+ lower extremity edema. But after talking to him, he is not real careful with his diet.  We discussed low carbohydrate diet.      RESOLVED: Hyperlipidemia with target LDL less than 70 (Chronic)   Relevant Medications   isosorbide mononitrate (IMDUR) 30 MG 24 hr tablet   Essential hypertension (Chronic)   Relevant Medications   isosorbide mononitrate (IMDUR) 30 MG 24 hr tablet   Dyslipidemia, statin intol (Chronic)    Continue Repatha.  Recheck lipids tomorrow.      CAD S/P PCI with 3 DES stents to RCA and PTCA x2 for ISR and thrombosis  (Chronic)   Relevant Medications   isosorbide mononitrate (IMDUR) 30 MG 24 hr tablet    Other Visit Diagnoses    Blood clotting disorder ([place])        Relevant Orders    Protime-INR    APTT    Preop examination        Relevant Orders    CBC    Comprehensive metabolic panel    Coronary artery disease involving coronary bypass graft of native heart with unspecified angina pectoris        Relevant Medications    isosorbide mononitrate (IMDUR) 30 MG 24 hr tablet    Other Relevant Orders    Lipid panel         History and all data above reviewed.  Patient examined.  I agree with the findings as above.   The patient has recurrent chest pain similar to his previous angina.  The patient exam reveals COR:RRR  ,  Lungs: Clear  ,  Abd: Positive bowel sounds, no rebound no guarding, Ext No edema  .  All available labs, radiology testing, previous records reviewed.  Agree with documented assessment and plan.  Chest pain consistent with previous unstable angina.  Needs cardiac cath.  The patient understands that risks included but are not limited to stroke (1 in 1000), death (1 in 57), kidney failure [usually temporary] (1 in 500), bleeding (1 in 200), allergic reaction [possibly serious] (1 in 200).  The patient understands and agrees to proceed.    Jeneen Rinks Hochrein  5:54 PM  07/19/2015

## 2015-07-19 NOTE — Patient Instructions (Signed)
Your physician has requested that you have a cardiac catheterization. Cardiac catheterization is used to diagnose and/or treat various heart conditions. Doctors may recommend this procedure for a number of different reasons. The most common reason is to evaluate chest pain. Chest pain can be a symptom of coronary artery disease (CAD), and cardiac catheterization can show whether plaque is narrowing or blocking your heart's arteries. This procedure is also used to evaluate the valves, as well as measure the blood flow and oxygen levels in different parts of your heart. For further information please visit [email or web address]. This will be scheduled for tomorrow.  Following your catheterization, you will not be allowed to drive for 3 days.  No lifting, pushing, or pulling greater that 10 pounds is allowed for 1 week.  You will be required to have the following tests prior to the procedure:  1. Blood work-the blood work can be done no more than 7 days prior to the procedure.  It can be done at any [facility] lab.  There is one downstairs on the first floor of this building and one in the [facility] building [phone]).  Your physician has recommended you make the following change in your medication: start new prescription for isosorbide MN 30 MG. THIS HAS BEEN SENT TO YOUR PHARMACY.

## 2015-07-20 ENCOUNTER — Ambulatory Visit (HOSPITAL_COMMUNITY)
Admission: RE | Admit: 2015-07-20 | Discharge: 2015-07-20 | Disposition: A | Discharge status: Home / Self Care | Payer: BLUE CROSS/BLUE SHIELD | Source: Ambulatory Visit | Attending: Cardiology | Admitting: Cardiology

## 2015-07-20 ENCOUNTER — Encounter (HOSPITAL_COMMUNITY)
Admission: RE | Disposition: A | Discharge status: Home / Self Care | Payer: BLUE CROSS/BLUE SHIELD | Source: Ambulatory Visit | Attending: Cardiology

## 2015-07-20 DIAGNOSIS — R072 Precordial pain: Secondary | ICD-10-CM | POA: Insufficient documentation

## 2015-07-20 DIAGNOSIS — G43909 Migraine, unspecified, not intractable, without status migrainosus: Secondary | ICD-10-CM | POA: Diagnosis not present

## 2015-07-20 DIAGNOSIS — I200 Unstable angina: Secondary | ICD-10-CM | POA: Diagnosis present

## 2015-07-20 DIAGNOSIS — E669 Obesity, unspecified: Secondary | ICD-10-CM | POA: Insufficient documentation

## 2015-07-20 DIAGNOSIS — Z7982 Long term (current) use of aspirin: Secondary | ICD-10-CM | POA: Insufficient documentation

## 2015-07-20 DIAGNOSIS — F17220 Nicotine dependence, chewing tobacco, uncomplicated: Secondary | ICD-10-CM | POA: Diagnosis not present

## 2015-07-20 DIAGNOSIS — I2582 Chronic total occlusion of coronary artery: Secondary | ICD-10-CM | POA: Insufficient documentation

## 2015-07-20 DIAGNOSIS — Z955 Presence of coronary angioplasty implant and graft: Secondary | ICD-10-CM | POA: Diagnosis not present

## 2015-07-20 DIAGNOSIS — Z8673 Personal history of transient ischemic attack (TIA), and cerebral infarction without residual deficits: Secondary | ICD-10-CM | POA: Diagnosis not present

## 2015-07-20 DIAGNOSIS — Z6837 Body mass index (BMI) 37.0-37.9, adult: Secondary | ICD-10-CM | POA: Diagnosis not present

## 2015-07-20 DIAGNOSIS — Z01818 Encounter for other preprocedural examination: Secondary | ICD-10-CM

## 2015-07-20 DIAGNOSIS — I10 Essential (primary) hypertension: Secondary | ICD-10-CM | POA: Diagnosis present

## 2015-07-20 DIAGNOSIS — I25110 Atherosclerotic heart disease of native coronary artery with unstable angina pectoris: Secondary | ICD-10-CM | POA: Diagnosis not present

## 2015-07-20 DIAGNOSIS — Z8249 Family history of ischemic heart disease and other diseases of the circulatory system: Secondary | ICD-10-CM | POA: Diagnosis not present

## 2015-07-20 DIAGNOSIS — Z9861 Coronary angioplasty status: Secondary | ICD-10-CM

## 2015-07-20 DIAGNOSIS — E785 Hyperlipidemia, unspecified: Secondary | ICD-10-CM | POA: Diagnosis not present

## 2015-07-20 DIAGNOSIS — Z7902 Long term (current) use of antithrombotics/antiplatelets: Secondary | ICD-10-CM | POA: Diagnosis not present

## 2015-07-20 DIAGNOSIS — I252 Old myocardial infarction: Secondary | ICD-10-CM | POA: Diagnosis not present

## 2015-07-20 DIAGNOSIS — I2510 Atherosclerotic heart disease of native coronary artery without angina pectoris: Secondary | ICD-10-CM

## 2015-07-20 DIAGNOSIS — E781 Pure hyperglyceridemia: Secondary | ICD-10-CM | POA: Diagnosis present

## 2015-07-20 HISTORY — PX: CARDIAC CATHETERIZATION: SHX172

## 2015-07-20 LAB — LIPID PANEL
Cholesterol: 129 mg/dL (ref 0–200)
HDL: 45 mg/dL (ref >40)
LDL CALC: 54 mg/dL (ref 0–99)
TRIGLYCERIDES: 150 mg/dL — AB (ref <150)
Total CHOL/HDL Ratio: 2.9 RATIO
VLDL: 30 mg/dL (ref 0–40)

## 2015-07-20 LAB — PROTIME-INR
INR: 1.09 (ref 0.00–1.49)
PROTHROMBIN TIME: 14.3 s (ref 11.6–15.2)

## 2015-07-20 SURGERY — LEFT HEART CATH AND CORONARY ANGIOGRAPHY
Anesthesia: LOCAL

## 2015-07-20 MED ORDER — SODIUM CHLORIDE 0.9 % IV SOLN: 250.0000 mL | INTRAVENOUS | Status: DC | PRN

## 2015-07-20 MED ORDER — SODIUM CHLORIDE 0.9 % IJ SOLN: 3.0000 mL | INTRAMUSCULAR | Status: DC | PRN

## 2015-07-20 MED ORDER — MIDAZOLAM HCL 2 MG/2ML IJ SOLN
INTRAMUSCULAR | Status: DC | PRN
  Administered 2015-07-20 (×3): 1 mg via INTRAVENOUS

## 2015-07-20 MED ORDER — VERAPAMIL HCL 2.5 MG/ML IV SOLN
INTRAVENOUS | Status: AC
  Filled 2015-07-20: qty 2

## 2015-07-20 MED ORDER — LIDOCAINE HCL (PF) 1 % IJ SOLN
INTRAMUSCULAR | Status: DC | PRN
  Administered 2015-07-20: 5 mL

## 2015-07-20 MED ORDER — IOHEXOL 350 MG/ML SOLN
INTRAVENOUS | Status: DC | PRN
  Administered 2015-07-20: 80 mL via INTRA_ARTERIAL

## 2015-07-20 MED ORDER — LIDOCAINE HCL (PF) 1 % IJ SOLN
INTRAMUSCULAR | Status: AC
  Filled 2015-07-20: qty 30

## 2015-07-20 MED ORDER — SODIUM CHLORIDE 0.9 % IV SOLN
INTRAVENOUS | Status: DC
  Administered 2015-07-20: 13:00:00 via INTRAVENOUS

## 2015-07-20 MED ORDER — HEPARIN SODIUM (PORCINE) 1000 UNIT/ML IJ SOLN
INTRAMUSCULAR | Status: DC | PRN
  Administered 2015-07-20: 5000 [IU] via INTRAVENOUS

## 2015-07-20 MED ORDER — VERAPAMIL HCL 2.5 MG/ML IV SOLN
INTRAVENOUS | Status: DC | PRN
  Administered 2015-07-20: 14:00:00 via INTRA_ARTERIAL

## 2015-07-20 MED ORDER — HEPARIN (PORCINE) IN NACL 2-0.9 UNIT/ML-% IJ SOLN
INTRAMUSCULAR | Status: AC
  Filled 2015-07-20: qty 1500

## 2015-07-20 MED ORDER — HEPARIN SODIUM (PORCINE) 1000 UNIT/ML IJ SOLN
INTRAMUSCULAR | Status: AC
  Filled 2015-07-20: qty 1

## 2015-07-20 MED ORDER — ASPIRIN 81 MG PO CHEW
81.0000 mg | CHEWABLE_TABLET | ORAL | Status: AC
  Administered 2015-07-20: 81 mg via ORAL

## 2015-07-20 MED ORDER — MIDAZOLAM HCL 2 MG/2ML IJ SOLN
INTRAMUSCULAR | Status: AC
  Filled 2015-07-20: qty 2

## 2015-07-20 MED ORDER — ASPIRIN 81 MG PO CHEW
CHEWABLE_TABLET | ORAL | Status: AC
  Administered 2015-07-20: 81 mg via ORAL
  Filled 2015-07-20: qty 1

## 2015-07-20 MED ORDER — FENTANYL CITRATE (PF) 100 MCG/2ML IJ SOLN
INTRAMUSCULAR | Status: DC | PRN
  Administered 2015-07-20 (×3): 25 ug via INTRAVENOUS

## 2015-07-20 MED ORDER — FENTANYL CITRATE (PF) 100 MCG/2ML IJ SOLN
INTRAMUSCULAR | Status: AC
  Filled 2015-07-20: qty 2

## 2015-07-20 MED ORDER — SODIUM CHLORIDE 0.9 % IJ SOLN: 3.0000 mL | Freq: Two times a day (BID) | INTRAMUSCULAR | Status: DC

## 2015-07-20 MED ORDER — SODIUM CHLORIDE 0.9 % WEIGHT BASED INFUSION: 3.0000 mL/kg/h | INTRAVENOUS | Status: DC

## 2015-07-20 SURGICAL SUPPLY — 16 items
CATH INFINITI 5 FR JL3.5 (CATHETERS) IMPLANT
CATH INFINITI 5FR ANG PIGTAIL (CATHETERS) IMPLANT
CATH INFINITI 5FR MULTPACK ANG (CATHETERS) ×2 IMPLANT
CATH INFINITI 6F ANG MULTIPACK (CATHETERS) ×2 IMPLANT
CATH INFINITI JR4 5F (CATHETERS) IMPLANT
DEVICE RAD COMP TR BAND LRG (VASCULAR PRODUCTS) ×2 IMPLANT
GLIDESHEATH SLEND SS 6F .021 (SHEATH) ×2 IMPLANT
KIT HEART LEFT (KITS) ×2 IMPLANT
PACK CARDIAC CATHETERIZATION (CUSTOM PROCEDURE TRAY) ×2 IMPLANT
SHEATH PINNACLE 5F 10CM (SHEATH) IMPLANT
SYR MEDRAD MARK V 150ML (SYRINGE) ×2 IMPLANT
TRANSDUCER W/STOPCOCK (MISCELLANEOUS) ×2 IMPLANT
TUBING CIL FLEX 10 FLL-RA (TUBING) ×2 IMPLANT
WIRE EMERALD 3MM-J .035X150CM (WIRE) IMPLANT
WIRE HI TORQ VERSACORE-J 145CM (WIRE) ×2 IMPLANT
WIRE SAFE-T 1.5MM-J .035X260CM (WIRE) ×2 IMPLANT

## 2015-07-20 NOTE — Discharge Instructions (Signed)
Radial Site Care °Refer to this sheet in the next few weeks. These instructions provide you with information about caring for yourself after your procedure. Your health care provider may also give you more specific instructions. Your treatment has been planned according to current medical practices, but problems sometimes occur. Call your health care provider if you have any problems or questions after your procedure. °WHAT TO EXPECT AFTER THE PROCEDURE °After your procedure, it is typical to have the following: °· Bruising at the radial site that usually fades within 1-2 weeks. °· Blood collecting in the tissue (hematoma) that may be painful to the touch. It should usually decrease in size and tenderness within 1-2 weeks. °HOME CARE INSTRUCTIONS °· Take medicines only as directed by your health care provider. °· You may shower 24-48 hours after the procedure or as directed by your health care provider. Remove the bandage (dressing) and gently wash the site with plain soap and water. Pat the area dry with a clean towel. Do not rub the site, because this may cause bleeding. °· Do not take baths, swim, or use a hot tub until your health care provider approves. °· Check your insertion site every day for redness, swelling, or drainage. °· Do not apply powder or lotion to the site. °· Do not flex or bend the affected arm for 24 hours or as directed by your health care provider. °· Do not push or pull heavy objects with the affected arm for 24 hours or as directed by your health care provider. °· Do not lift over 10 lb (4.5 kg) for 5 days after your procedure or as directed by your health care provider. °· Ask your health care provider when it is okay to: °¨ Return to work or school. °¨ Resume usual physical activities or sports. °¨ Resume sexual activity. °· Do not drive home if you are discharged the same day as the procedure. Have someone else drive you. °· You may drive 24 hours after the procedure unless otherwise  instructed by your health care provider. °· Do not operate machinery or power tools for 24 hours after the procedure. °· If your procedure was done as an outpatient procedure, which means that you went home the same day as your procedure, a responsible adult should be with you for the first 24 hours after you arrive home. °· Keep all follow-up visits as directed by your health care provider. This is important. °SEEK MEDICAL CARE IF: °· You have a fever. °· You have chills. °· You have increased bleeding from the radial site. Hold pressure on the site. °SEEK IMMEDIATE MEDICAL CARE IF: °· You have unusual pain at the radial site. °· You have redness, warmth, or swelling at the radial site. °· You have drainage (other than a small amount of blood on the dressing) from the radial site. °· The radial site is bleeding, and the bleeding does not stop after 30 minutes of holding steady pressure on the site. °· Your arm or hand becomes pale, cool, tingly, or numb. °  °This information is not intended to replace advice given to you by your health care provider. Make sure you discuss any questions you have with your health care provider. °  °Document Released: 08/23/2010 Document Revised: 08/11/2014 Document Reviewed: 02/06/2014 °[employer]vier Interactive Patient Education ©2016 [employer] ° °

## 2015-07-20 NOTE — H&P (View-Only) (Signed)
Patient ID: Dylan [name], male   DOB: 12-13-1965, 49 y.o.   MRN: [id number]    Date:  07/19/2015   ID:  Dylan [name], DOB 10/14/1965, MRN [id number]  PCP:  No PCP Per Patient  Primary Cardiologist:  Ellyn Hack   Chief Complaint  Patient presents with  . Chest Pain     History of Present Illness:  Dylan [name] is a 49 y.o. male with a PMH below who presents with complaints of chest pain.  The past 2 weeks he's been having chest pain that radiates to his back. It is similar in presentation to previous MIs however, not as severe.  He has associated diaphoresis and shortness of breath.  He also reports for the last 2 months getting progressively more tired and sob when he exerts himself.  He currently denies nausea, vomiting, fever, orthopnea, dizziness, PND, cough, congestion, abdominal pain, hematochezia, melena, claudication.  His history so includes mesenteric vein, stroke, perforated small intestine, thrombosis, hypertension, dyslipidemia as well as being a former smoker.  He was started on repatha approximately 2 months.   After December 2013 where he had PTCA to in-stent restenosis, he was lost to followup.   Cardiac History:  November 2011: non- STEMI: 100% occluded RCA after conus branch --> Following initial angioplasty there was a long area of disease that required extensive PCI with multiple drug-eluting stents Taxs ION 3.0-3.5 mm stents).  December 2013: Unstable Angina --> Focal 75% ISR in mid RCA stent overlapped segment --> Angiosculpt PTCA  January 2016: Shortly after starting testosterone injections he presented with mesenteric vein thrombosis treated with thrombectomy. This was complicated by mild non-STEMI --> Was found to have diffuse in-stent thrombosis with no significant stenosis. PTCA was performed with disruption of thrombosis, but with residual thrombus noted.  After the initial hospitalization with mesenteric vein thrombectomy and RCA PTCA, he then  presented again with acute abdominal pain was found to have a perforated small bowel that failed initial medical management. He subsequently underwent exploratory laparoscopy followed by partial bowel resection. He was maintained on IV heparin in the perioperative period and switched back to Pradaxa plus aspirin and Plavix.  He no longer takes Pradaxa   Wt Readings from Last 3 Encounters:  07/19/15 288 lb 8 oz (130.863 kg)  07/12/15 275 lb (124.739 kg)  05/13/15 265 lb (120.203 kg)     Past Medical History  Diagnosis Date  . CAD S/P percutaneous coronary angioplasty 07/2010; 07/2012; 08/2014    1) 2/'11: MI - 100% RCA - PCI (3 Taxus ION DES 3.0 x 28, 3.0 x 24 & 3.5 x 12 distal-prox); b) ISR of RCA stent -AnngioSculpt PTCA; c) NSTEMI 1/'16: mild ISR with Thrombosis of RCA Stents (in setting of Mesenteric Vein Thrombosis) - Cutting Balloon PTCA  . NSTEMI (non-ST elevated myocardial infarction) ([place]) 07/2010, 08/2014    And unstable angina in February 20 13th; Echo 1/12/'16: Moderate concentric LVH. EF 60-65% with no regional WMA. Gr 1 DD, otherwise normal  . Stroke ([place]) 2011; 2013    S/P cardiac cath - embolic stroke; denies residual (07/06/2012)  . 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 04/19/2013    No evidence of CVA on MRI/MRA & Dliated Eye Examination. ruptured blood vessel & not occlusion.  . Coronary stent thrombosis 08/15/2014  . Hyperlipidemia with target LDL less than 70 09/01/2014  . Mesenteric vein thrombosis   .  Essential hypertension 07/07/2012  . Migraines     "maybe one/yr" (07/06/2012)    Current Outpatient Prescriptions  Medication Sig Dispense Refill  . acetaminophen (TYLENOL) 500 MG tablet Take 1 tablet (500 mg total) by mouth every 4 (four) hours as needed for mild pain, fever or headache.    Marland Kitchen BENICAR 20 MG tablet TAKE 1 TABLET(20 MG) BY MOUTH DAILY 30 tablet 3  .  clopidogrel (PLAVIX) 75 MG tablet Take 1 tablet (75 mg total) by mouth daily with breakfast. 90 tablet 1  . Evolocumab 140 MG/ML SOAJ Inject 140 mg into the skin every 14 (fourteen) days. 2 pen 11  . furosemide (LASIX) 20 MG tablet Take 1 tablet (20 mg total) by mouth daily. 90 tablet 1  . KRILL OIL PO Take 1 capsule by mouth daily.    Marland Kitchen MAGNESIUM PO Take 1 tablet by mouth daily.    . metoprolol succinate (TOPROL-XL) 50 MG 24 hr tablet Take 1 tablet (50 mg total) by mouth 2 (two) times daily. Take with or immediately following a meal. 60 tablet 0  . nitroGLYCERIN (NITROSTAT) 0.4 MG SL tablet Place 1 tablet (0.4 mg total) under the tongue every 5 (five) minutes x 3 doses as needed for chest pain. 25 tablet 2  . olmesartan (BENICAR) 20 MG tablet Take 1 tablet (20 mg total) by mouth daily. 30 tablet 3  . ZETIA 10 MG tablet Take 10 mg by mouth daily.   1  . fenofibrate 160 MG tablet Take 1 tablet (160 mg total) by mouth daily. (Patient not taking: Reported on 07/19/2015) 30 tablet 11  . isosorbide mononitrate (IMDUR) 30 MG 24 hr tablet Take 1 tablet (30 mg total) by mouth daily. 30 tablet 3   No current facility-administered medications for this visit.    Allergies:    Allergies  Allergen Reactions  . Ace Inhibitors Cough  . Lipitor [Atorvastatin] Other (See Comments)    myalgia  . Crestor [Rosuvastatin] Other (See Comments)  . Zofran [Ondansetron Hcl] Nausea And Vomiting  . Codeine Itching    Social History:  The patient  reports that he quit smoking about 5 years ago. His smoking use included Cigarettes. He has a 15 pack-year smoking history. He quit smokeless tobacco use about 5 years ago. His smokeless tobacco use included Chew. He reports that he drinks about 0.6 oz of alcohol per week. He reports that he does not use illicit drugs.   Family history:   Family History  Problem Relation Age of Onset  . Atrial fibrillation Mother   . Mitral valve prolapse Mother   . Coronary artery  disease Father     CABG, aortic aneursym,    ROS:  Please see the history of present illness.  All other systems reviewed and negative.   PHYSICAL EXAM: VS:  BP 150/100 mmHg  Pulse 75  Ht 6\' 2"  (1.88 m)  Wt 288 lb 8 oz (130.863 kg)  BMI 37.03 kg/m2 obese, well developed, in no acute distress HEENT: Pupils are equal round react to light accommodation extraocular movements are intact.  Neck: no JVDNo cervical lymphadenopathy. Cardiac: Regularly irregular rhythm  controlledwithout murmurs rubs or gallops. Lungs:  clear to auscultation bilaterally, no wheezing, rhonchi or rales Abd: soft, nontender, positive bowel sounds all quadrants, no hepatosplenomegaly Ext: 1+ lower extremity edema.  2+ radial and dorsalis pedis pulses. Skin: warm and dry Neuro:  Grossly normal  EKG:  Sinus rhythm with PACs rate 75 bpm  Lipid Panel  Component Value Date/Time   CHOL 179 04/02/2015 1029   TRIG 132 04/02/2015 1029   HDL 35* 04/02/2015 1029   CHOLHDL 5.1* 04/02/2015 1029   VLDL 26 04/02/2015 1029   LDLCALC 118 04/02/2015 1029     ASSESSMENT AND PLAN:  Problem List Items Addressed This Visit    Unstable angina ([place]) - Primary    Patient presents with 2 weeks of progressively worsening chest pain. Today when he was in the office was 4/10.  It radiates to his back. Is also associated with diaphoresis. I had him take one of his sublingual nitroglycerin's and within a minute it was resolved. He's also noticed progressively worsening fatigue and shortness of breath.  He's had multiple catheterizations in the last 5 years with in-stent restenosis each time. Last time was 08/15/2014 and that revealed severe in-stent thrombosis of the distal portion of the significant stented segment of the RCA. He underwent successful PTCA only of the in-stent segments in the mid RCA with minimal residual thrombus present, but TIMI-3 flow.   The patient be scheduled for left heart catheterization tomorrow .  I will  also prescribed him 30 mg of Imdur to start taking tonight. His blood pressure is adequate.  Continue aspirin, Plavix, Toprol.    I suspect he probably needs extra Lasix low is not complaining of orthopnea or PND.  We will find out what the  LVEDP is tomorrow      Relevant Medications   isosorbide mononitrate (IMDUR) 30 MG 24 hr tablet   Obesity (BMI 30-39.9) (Chronic)    Patient's weight continues to go up. Was 265 pounds back in October it is now 74.  Suspect some this is fluid as he has 1+ lower extremity edema. But after talking to him, he is not real careful with his diet.  We discussed low carbohydrate diet.      RESOLVED: Hyperlipidemia with target LDL less than 70 (Chronic)   Relevant Medications   isosorbide mononitrate (IMDUR) 30 MG 24 hr tablet   Essential hypertension (Chronic)   Relevant Medications   isosorbide mononitrate (IMDUR) 30 MG 24 hr tablet   Dyslipidemia, statin intol (Chronic)    Continue Repatha.  Recheck lipids tomorrow.      CAD S/P PCI with 3 DES stents to RCA and PTCA x2 for ISR and thrombosis  (Chronic)   Relevant Medications   isosorbide mononitrate (IMDUR) 30 MG 24 hr tablet    Other Visit Diagnoses    Blood clotting disorder ([place])        Relevant Orders    Protime-INR    APTT    Preop examination        Relevant Orders    CBC    Comprehensive metabolic panel    Coronary artery disease involving coronary bypass graft of native heart with unspecified angina pectoris        Relevant Medications    isosorbide mononitrate (IMDUR) 30 MG 24 hr tablet    Other Relevant Orders    Lipid panel         History and all data above reviewed.  Patient examined.  I agree with the findings as above.   The patient has recurrent chest pain similar to his previous angina.  The patient exam reveals COR:RRR  ,  Lungs: Clear  ,  Abd: Positive bowel sounds, no rebound no guarding, Ext No edema  .  All available labs, radiology testing, previous records reviewed.  Agree with documented assessment and plan.  Chest pain consistent with previous unstable angina.  Needs cardiac cath.  The patient understands that risks included but are not limited to stroke (1 in 1000), death (1 in 77), kidney failure [usually temporary] (1 in 500), bleeding (1 in 200), allergic reaction [possibly serious] (1 in 200).  The patient understands and agrees to proceed.    Jeneen Rinks Camiah Humm  5:54 PM  07/19/2015

## 2015-07-20 NOTE — Interval H&P Note (Signed)
History and Physical Interval Note:  07/20/2015 2:00 PM  Dylan [name]  has presented today for surgery, with the diagnosis of unstable angina  The various methods of treatment have been discussed with the patient and family. After consideration of risks, benefits and other options for treatment, the patient has consented to  Procedure(s): Left Heart Cath and Coronary Angiography (N/A) as a surgical intervention .  The patient's history has been reviewed, patient examined, no change in status, stable for surgery.  I have reviewed the patient's chart and labs.  Questions were answered to the patient's satisfaction.   Cath Lab Visit (complete for each Cath Lab visit)  Clinical Evaluation Leading to the Procedure:   ACS: Yes.    Non-ACS:    Anginal Classification: CCS III  Anti-ischemic medical therapy: Maximal Therapy (2 or more classes of medications)  Non-Invasive Test Results: No non-invasive testing performed  Prior CABG: No previous CABG        Dylan [name] [facility] 07/20/2015 2:00 PM

## 2015-07-23 ENCOUNTER — Encounter (HOSPITAL_COMMUNITY): Payer: Self-pay | Admitting: Cardiology

## 2015-07-23 MED FILL — Heparin Sodium (Porcine) 2 Unit/ML in Sodium Chloride 0.9%: INTRAMUSCULAR | Qty: 1000 | Status: AC

## 2015-08-11 ENCOUNTER — Other Ambulatory Visit: Payer: Self-pay | Admitting: Cardiology

## 2015-08-13 NOTE — Telephone Encounter (Signed)
E-sent to pharmacy x 3 refill

## 2015-09-07 ENCOUNTER — Telehealth: Payer: Self-pay | Admitting: *Deleted

## 2015-09-07 NOTE — Telephone Encounter (Signed)
Request for surgical clearance:  1. What type of surgery is being performed? colonoscopy   ( for screening)  2. When is this surgery scheduled?  09/28/15  3. Are there any medications that need to be held prior to surgery and how long?plavix   4. Name of physician performing surgery? Dr Michail Sermon   5. What is your office phone and fax number?   Fax [phone] , [phone] 6.

## 2015-09-07 NOTE — Telephone Encounter (Signed)
Routed to Dr Michail Sermon - GI

## 2015-09-07 NOTE — Telephone Encounter (Signed)
While he is on both Pradaxa and Plavix. They didn't ask about Pradaxa.  Okay to hold Plavix for 5 days pre-op. Okay to hold Pradaxa 48 hours preprocedure   Restart both the next morning  HARDING, Leonie Green, MD

## 2015-09-28 ENCOUNTER — Other Ambulatory Visit: Payer: Self-pay | Admitting: Gastroenterology

## 2015-10-03 ENCOUNTER — Ambulatory Visit (INDEPENDENT_AMBULATORY_CARE_PROVIDER_SITE_OTHER): Payer: BLUE CROSS/BLUE SHIELD | Admitting: Cardiology

## 2015-10-03 ENCOUNTER — Encounter: Payer: Self-pay | Admitting: Cardiology

## 2015-10-03 VITALS — BP 108/78 | HR 88 | Ht 74.0 in | Wt 260.0 lb

## 2015-10-03 DIAGNOSIS — E669 Obesity, unspecified: Secondary | ICD-10-CM

## 2015-10-03 DIAGNOSIS — K55069 Acute infarction of intestine, part and extent unspecified: Secondary | ICD-10-CM

## 2015-10-03 DIAGNOSIS — E785 Hyperlipidemia, unspecified: Secondary | ICD-10-CM

## 2015-10-03 DIAGNOSIS — E781 Pure hyperglyceridemia: Secondary | ICD-10-CM

## 2015-10-03 DIAGNOSIS — I81 Portal vein thrombosis: Secondary | ICD-10-CM | POA: Diagnosis not present

## 2015-10-03 DIAGNOSIS — T82857D Stenosis of cardiac prosthetic devices, implants and grafts, subsequent encounter: Secondary | ICD-10-CM

## 2015-10-03 DIAGNOSIS — I2510 Atherosclerotic heart disease of native coronary artery without angina pectoris: Secondary | ICD-10-CM | POA: Diagnosis not present

## 2015-10-03 DIAGNOSIS — I491 Atrial premature depolarization: Secondary | ICD-10-CM

## 2015-10-03 DIAGNOSIS — Z9861 Coronary angioplasty status: Secondary | ICD-10-CM

## 2015-10-03 DIAGNOSIS — T82867D Thrombosis of cardiac prosthetic devices, implants and grafts, subsequent encounter: Secondary | ICD-10-CM

## 2015-10-03 DIAGNOSIS — I214 Non-ST elevation (NSTEMI) myocardial infarction: Secondary | ICD-10-CM | POA: Diagnosis not present

## 2015-10-03 DIAGNOSIS — IMO0002 Reserved for concepts with insufficient information to code with codable children: Secondary | ICD-10-CM

## 2015-10-03 DIAGNOSIS — I10 Essential (primary) hypertension: Secondary | ICD-10-CM

## 2015-10-03 MED ORDER — NITROGLYCERIN 0.4 MG SL SUBL: 0.4000 mg | SUBLINGUAL_TABLET | SUBLINGUAL | Status: DC | PRN

## 2015-10-03 MED ORDER — FUROSEMIDE 40 MG PO TABS: 40.0000 mg | ORAL_TABLET | Freq: Two times a day (BID) | ORAL | Status: DC

## 2015-10-03 NOTE — Progress Notes (Signed)
PCP: No PCP Per Patient  Clinic Note: Chief Complaint  Patient presents with  . Follow-up    After cardiac catheterization  . Coronary Artery Disease    Patent stents by cath  . Palpitations    Frequent PACs  . Hyperlipidemia    HPI: Dylan [name] is a 50 y.o. male with a PMH below who presents today for three-month follow-up with a history of coronary disease, PE as well as PAF. He is losing weight. Continues to have obesity..  November 2011: Non-STEMI. Occluded RCA at conus --> multiple Taxus DES stents  December 2013: Unstable angina --> focal 70% ISR in mid RCA at the overlapped stent segment. 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 -->  F/u admission for SBO, perforated bowel  - Exlap with partial bowel resection. -- converted from Warfarin to Pradaxa.  He was seen in the A. fib clinic for palpitations with increasing resting heart rate. He was noted to have significant PACs, but not A. Fib. --> Recommendation of that time was to continue Lasix and stop HCTZ. Would convert to Benicar without HCTZ, ensure hydration and transition metoprolol to daily at bedtime.  It is thought that the palpitations were related to orthostatic hypotension. Recommended repeating calcium. 48-hour monitor placed -- simply noted frequent PACs. Was told to cut Benicar in half or simply hold if hypotensive.  Dylan [name] was last seen on 07/19/2015 by [name], PA-C.  At that time he noted about 2 months of worsening exertional dyspnea fatigue. He is also noticing about 2 weeks of chest pain radiating to the back. Symptoms were concerning for potential unstable angina. Therefore he was referred for cardiac catheterization on December 16.   Recent  Hospitalizations: Cardiac cath in December 2016  Studies Reviewed:  -- Cath Dec 16th:   Prox RCA to Dist RCA lesion, 15% stenosed. The lesion was previously treated with a drug-eluting stent greater than two years ago.  Prox LAD to Mid LAD lesion, 20% stenosed.  Ost Cx to Prox Cx lesion, 20% stenosed.  The left ventricular systolic function is normal.  1. Nonobstructive CAD 2. Normal LV function.     Interval History: Dylan [name] presents today for post hospital/follow-up. He states he is not really having any more the chest discomfort symptoms. He is still however noticing this frequent PACs but is much less frequent since increasing the Toprol to twice a day. Notes it more if he gets less sleep or is more tired or dehydrated. At that case it happens with more frequency more intensity. Is not irregular. These not long-lasting episodes. He does feel dizzy but now has no dyspnea or syncope/near-syncope associated with it.   No episodes of chest pain or shortness of breath with rest or exertion.  No PND, orthopnea or edema.  No weakness or syncope/near syncope, or TIA/amaurosis fugax symptoms. No melena, hematochezia, hematuria, or epstaxis. No claudication.  ROS: A comprehensive was performed. ROS   Past Medical History  Diagnosis Date  . CAD S/P percutaneous coronary angioplasty 07/2010; 07/2012; 08/2014    1) 2/'11: MI - 100% RCA - PCI (3 Taxus ION DES 3.0 x 28, 3.0 x 24 & 3.5 x 12 distal-prox); b) ISR of RCA stent -AnngioSculpt PTCA; c) NSTEMI 1/'16: mild ISR with Thrombosis of RCA  Stents (in setting of Mesenteric Vein Thrombosis) - Cutting Balloon PTCA  . NSTEMI (non-ST elevated myocardial infarction) ([place]) 07/2010, 08/2014    And unstable angina in February 20 13th; Echo 1/12/'16: Moderate concentric LVH. EF 60-65% with no regional WMA. Gr 1 DD, otherwise normal  . Stroke ([place]) 2011; 2013    S/P cardiac cath - embolic stroke; denies residual (07/06/2012)  . 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 04/19/2013    No evidence of CVA on MRI/MRA & Dliated Eye Examination. ruptured blood vessel & not occlusion.  . Coronary stent thrombosis 08/15/2014  . Hyperlipidemia with target LDL less than 70 09/01/2014  . Mesenteric vein thrombosis   . Essential hypertension 07/07/2012  . Migraines     "maybe one/yr" (07/06/2012)  . Finding of multiple premature atrial contractions by electrocardiography     Symptomatic palpitations ; worse with poor sleep and fatigue along with dehydration.     Past Surgical History  Procedure Laterality Date  . Septoplasty  2001  . Coronary angioplasty with stent placement  07/2010    inferior wall MI - 3 Taxus Ion DES (3.0x4mm, 3.0x54mm, 3.5x67mm) to prox and mid RCA  . Coronary angioplasty  07/06/2012; 08/15/2014    a) 12/'13: PTCA of 80% ISR mRCA - AngioSculpt; b) PTCA of  ISR/thrombosis  . Cardiac catheterization  04/18/2013  . Left heart catheterization with coronary angiogram N/A 06/11/2012    Procedure: LEFT HEART CATHETERIZATION WITH CORONARY ANGIOGRAM;  Surgeon: [name], MD;  Location: [facility] CATH LAB;  Service: Cardiovascular;  Laterality: N/A;  . Percutaneous coronary stent intervention (pci-s) N/A 07/06/2012    Procedure: PERCUTANEOUS CORONARY STENT INTERVENTION (PCI-S);  Surgeon: [name], MD;  Location: [facility] CATH LAB;  Service: Cardiovascular;  Laterality: N/A;  . Left heart catheterization with coronary angiogram N/A 04/18/2013    Procedure: LEFT HEART CATHETERIZATION WITH CORONARY ANGIOGRAM;  Surgeon: [name], MD;  Location[facility] CATH LAB;  Service: Cardiovascular;  Laterality: N/A;  . Left heart catheterization with coronary angiogram N/A 08/15/2014    Procedure: LEFT HEART CATHETERIZATION WITH CORONARY ANGIOGRAM;  Surgeon: [name], MD;  Location: [facility] CATH LAB;  Service: Cardiovascular;  Laterality: N/A;  .  Laparoscopic appendectomy N/A 08/28/2014    Procedure: APPENDECTOMY LAPAROSCOPIC;  Surgeon: [name], MD;  Location: [place];  Service: General;  Laterality: N/A;  . Bowel resection N/A 08/28/2014    Procedure: SMALL BOWEL RESECTION;  Surgeon: [name], MD;  Location: [place];  Service: General;  Laterality: N/A;  . Laparoscopy  08/28/2014    Procedure: LAPAROSCOPY DIAGNOSTIC;  Surgeon: [name], MD;  Location: [place];  Service: General;;  . Transthoracic echocardiogram  08/15/2014    Moderate concentric LVH. EF 60-65% with no regional WMA. Gr 1 DD, otherwise normal  . Cardiac catheterization N/A 07/20/2015    Procedure: Left Heart Cath and Coronary Angiography;  Surgeon: [name], MD;  Location: [place] CV LAB;  Service: Cardiovascular;  50% ISR in the RCA stent segment, 20% proximal-mid LAD as well as ostial and proximal circumflex. Normal LV function.   Prior to Admission medications   Medication Sig Start Date End Date Taking? Authorizing Provider  acetaminophen (TYLENOL) 500 MG tablet Take 1 tablet (500 mg total) by mouth every 4 (four) hours as needed for mild pain, fever or headache. 08/16/14  Yes Cherene Altes, MD  clopidogrel (  PLAVIX) 75 MG tablet Take 1 tablet (75 mg total) by mouth daily with breakfast. 03/30/15  Yes [name], MD  Evolocumab 140 MG/ML SOAJ Inject 140 mg into the skin every 14 (fourteen) days. 04/25/15  Yes [name], MD  finasteride (PROPECIA) 1 MG tablet Take 1 mg by mouth daily.   Yes Historical Provider, MD  furosemide (LASIX) 10 MG/ML solution Take 40 mg by mouth 2 (two) times daily.   Yes Historical Provider, MD  metoprolol succinate (TOPROL-XL) 50 MG 24 hr tablet Take 1 tablet (50 mg total) by mouth 2 (two) times daily. Take with or immediately following a meal. 05/13/15  Yes Eugenie Filler, MD  nitroGLYCERIN (NITROSTAT) 0.4 MG SL tablet Place 1 tablet (0.4 mg total) under the tongue every 5 (five) minutes x 3 doses as  needed for chest pain. 07/07/12  Yes Luke K Kilroy, PA-C  olmesartan (BENICAR) 20 MG tablet Take 1 tablet (20 mg total) by mouth daily. 05/13/15  Yes Eugenie Filler, MD   Allergies  Allergen Reactions  . Ace Inhibitors Cough  . Lipitor [Atorvastatin] Other (See Comments)    myalgia  . Crestor [Rosuvastatin] Other (See Comments)  . Zofran [Ondansetron Hcl] Nausea And Vomiting  . Codeine Itching    Social History   Social History  . Marital Status: Married    Spouse Name: N/A  . Number of Children: 2  . Years of Education: 12   Occupational History  . English as a second language teacher   Social History Main Topics  . Smoking status: Former Smoker -- 1.00 packs/day for 15 years    Types: Cigarettes    Quit date: 06/30/2010  . Smokeless tobacco: Former Systems developer    Types: [place] date: 06/30/2010  . Alcohol Use: 0.6 oz/week    1 Shots of liquor per week     Comment: a  glass of scotch per day  . Drug Use: No  . Sexual Activity: Yes   Other Topics Concern  . None   Social History Narrative   he and his wife are expecting a baby boy within the next week or 2. He is hoping that when that happens, his full deductible for the year will be paid, allowing him to then restart Repatha.   Family History  Problem Relation Age of Onset  . Atrial fibrillation Mother   . Mitral valve prolapse Mother   . Coronary artery disease Father     CABG, aortic aneursym,    Wt Readings from Last 3 Encounters:  10/03/15 260 lb (117.935 kg)  07/20/15 275 lb (124.739 kg)  07/19/15 288 lb 8 oz (130.863 kg)  - he weighed 265 pounds at home.  PHYSICAL EXAM BP 108/78 mmHg  Pulse 88  Ht 6\' 2"  (1.88 m)  Wt 260 lb (117.935 kg)  BMI 33.37 kg/m2 General appearance: alert, cooperative, appears stated age, no distress, mildly obese and Healthy-appearing. HEENT: [place]/AT, EOMI, MMM, anicteric sclera Neck: no adenopathy, no carotid bruit, no JVD and supple, symmetrical, trachea midline Lungs: CTA B, normal  percussion bilaterally and Nonlabored, good air movement Heart: RRR with ectopy, S1& S2 normal, no murmur, click, rub or gallop and normal apical impulse Abdomen: Soft, appropriately tender around the incision site. No HSM. He does have some mild serosanguineous drainage from the incision site following suture removal. Mild staining on the gauze but not significant amount of blood. Extremities: extremities normal, atraumatic, no cyanosis - trivial-1+ edema Pulses: 2+ and symmetric  Neurologic: Alert and oriented X 3, normal strength and tone. Normal symmetric reflexes. 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 Completed last month      Relevant Medications   furosemide (LASIX) 40 MG tablet   nitroGLYCERIN (NITROSTAT) 0.4 MG SL tablet   Obesity (BMI 30-39.9) (Chronic)    He is now able to keep his weight off. He is really excited these back down into the 260s from the to radiate that he was last December. He is watching his diet and exercising. With the upcoming birth of his new baby boy, he is even more anxious and excited on weight loss and maintaining a better overall healthy lifestyle.      NSTEMI (non-ST elevated myocardial infarction) - 07/2010, 2013, 08/2014 (Chronic)    Likely, he is no significant residual effect from his MI. EF was relatively recent preserved. He has lots of stents in the RCA, and therefore is on lifelong antiplatelet agent. He is a very good job with weight loss and trying to monitor his glycemic control, hypertension and lipids.      Relevant  Medications   furosemide (LASIX) 40 MG tablet   nitroGLYCERIN (NITROSTAT) 0.4 MG SL tablet   Hypertriglyceridemia - TG >500 (Chronic)    Dramatic improvement on Repatha. He we need to see what his triglycerides look like, now he is off Zetia, but I suspect that they should be well controlled.      Relevant Medications   furosemide (LASIX) 40 MG tablet   nitroGLYCERIN (NITROSTAT) 0.4 MG SL tablet   Finding of multiple premature atrial contractions by electrocardiography (Chronic)    He is on a high dose of beta blocker. I wonder, since some of his symptoms were made worse by lying down and fatigue could his symptoms being more triggered by slight volume overload. Plan we do increase Lasix to 40 mg from 20. He has a little bit of mild edema intermittently.  Hopefully by reducing his left ventricular volume and therefore LVEDP, we can reduce the frequency of his PA-C mediated palpitations.      Essential hypertension (Chronic)    Blood pressure looks stable. I agree with having stopped the HCTZ. Clearly need to avoid dehydration and over fatigue due to the increased palpitations, however, we cannot but the blood pressure drift to eye. He is on a standing dose of Toprol which is maintaining his PACs somewhat control. He is also on Benicar without HCTZ. No room to further titrate      Relevant Medications   furosemide (LASIX) 40 MG tablet   nitroGLYCERIN (NITROSTAT) 0.4 MG SL tablet   Dyslipidemia, statin intol (Chronic)    Continues on Repatha. We have stopped Zetia. Most recent labs from December were outstanding exception of slightly elevated triglycerides. Continue active exercise and Repatha.      Coronary stent thrombosis (Chronic)    This was in the setting of diffuse body thrombosis with mesenteric vein thrombosis in the setting of testosterone use. However for this reason I would not stop  Plavix      Relevant Medications   furosemide (LASIX) 40 MG tablet   nitroGLYCERIN  (NITROSTAT) 0.4 MG SL tablet   CAD S/P PCI with 3 DES stents to RCA and PTCA x2 for ISR and thrombosis  - Primary (Chronic)    Doing outstandingly well. No further chest discomfort. Cardiac catheterization was very reassuring. He remains on Plavix without aspirin. He is on standing dose of beta blocker and Benicar. No nitroglycerin. But he has Imdur listed -- instructions were to start taking if he required nitroglycerin. He has not had any further need.      Relevant Medications   furosemide (LASIX) 40 MG tablet   nitroGLYCERIN (NITROSTAT) 0.4 MG SL tablet      Current medicines are reviewed at length with the patient today. (+/- concerns) increased swelling. The following changes have been made: Increase Lasix to 40 mg daily  Studies Ordered:   No orders of the defined types were placed in this encounter.    Follow-up 1 year    [name], M.D., M.S. Interventional Cardiologist   Pager # [phone] Phone # [phone] [address] [place], [place] [address]

## 2015-10-03 NOTE — Patient Instructions (Signed)
Take Lasix 40 mg twice a day     Your physician wants you to follow-up in: 1 year. You will receive a reminder letter in the mail two months in advance. If you don't receive a letter, please call our office to schedule the follow-up appointment.

## 2015-10-05 ENCOUNTER — Encounter: Payer: Self-pay | Admitting: Cardiology

## 2015-10-05 DIAGNOSIS — I491 Atrial premature depolarization: Secondary | ICD-10-CM | POA: Insufficient documentation

## 2015-10-05 NOTE — Assessment & Plan Note (Signed)
Continues on Repatha. We have stopped Zetia. Most recent labs from December were outstanding exception of slightly elevated triglycerides. Continue active exercise and Repatha.

## 2015-10-05 NOTE — Assessment & Plan Note (Signed)
Likely, he is no significant residual effect from his MI. EF was relatively recent preserved. He has lots of stents in the RCA, and therefore is on lifelong antiplatelet agent. He is a very good job with weight loss and trying to monitor his glycemic control, hypertension and lipids.

## 2015-10-05 NOTE — Assessment & Plan Note (Signed)
He is now able to keep his weight off. He is really excited these back down into the 260s from the to radiate that he was last December. He is watching his diet and exercising. With the upcoming birth of his new baby boy, he is even more anxious and excited on weight loss and maintaining a better overall healthy lifestyle.

## 2015-10-05 NOTE — Assessment & Plan Note (Signed)
This was in the setting of diffuse body thrombosis with mesenteric vein thrombosis in the setting of testosterone use. However for this reason I would not stop Plavix

## 2015-10-05 NOTE — Assessment & Plan Note (Signed)
Blood pressure looks stable. I agree with having stopped the HCTZ. Clearly need to avoid dehydration and over fatigue due to the increased palpitations, however, we cannot but the blood pressure drift to eye. He is on a standing dose of Toprol which is maintaining his PACs somewhat control. He is also on Benicar without HCTZ. No room to further titrate

## 2015-10-05 NOTE — Assessment & Plan Note (Signed)
He is on a high dose of beta blocker. I wonder, since some of his symptoms were made worse by lying down and fatigue could his symptoms being more triggered by slight volume overload. Plan we do increase Lasix to 40 mg from 20. He has a little bit of mild edema intermittently.  Hopefully by reducing his left ventricular volume and therefore LVEDP, we can reduce the frequency of his PA-C mediated palpitations.

## 2015-10-05 NOTE — Assessment & Plan Note (Signed)
No longer on Pradaxa. Completed last month

## 2015-10-05 NOTE — Assessment & Plan Note (Signed)
Doing outstandingly well. No further chest discomfort. Cardiac catheterization was very reassuring. He remains on Plavix without aspirin. He is on standing dose of beta blocker and Benicar. No nitroglycerin. But he has Imdur listed -- instructions were to start taking if he required nitroglycerin. He has not had any further need.

## 2015-10-05 NOTE — Assessment & Plan Note (Addendum)
Dramatic improvement on Repatha. He we need to see what his triglycerides look like, now he is off Zetia, but I suspect that they should be well controlled.

## 2015-10-21 ENCOUNTER — Other Ambulatory Visit: Payer: Self-pay | Admitting: Cardiology

## 2015-10-22 NOTE — Telephone Encounter (Signed)
Rx request sent to pharmacy.  

## 2015-11-12 ENCOUNTER — Other Ambulatory Visit: Payer: Self-pay | Admitting: Pharmacist Clinician (PhC)/ Clinical Pharmacy Specialist

## 2015-11-12 ENCOUNTER — Other Ambulatory Visit (HOSPITAL_COMMUNITY): Payer: Self-pay | Admitting: Nurse Practitioner

## 2015-11-12 MED ORDER — METOPROLOL SUCCINATE ER 50 MG PO TB24: 50.0000 mg | ORAL_TABLET | Freq: Two times a day (BID) | ORAL | Status: DC

## 2015-11-12 NOTE — Telephone Encounter (Signed)
Chart does not indicate dose increase from 75 to 100 mg.  Patient states they talked about it at appt and Dr. Ellyn Hack was okay to go to 100 mg but did not want to push above that.  Will clarify with MD, Rx sent in for now, as patient has been taking for last week or two and feels much better.

## 2016-03-17 DIAGNOSIS — Z3009 Encounter for other general counseling and advice on contraception: Secondary | ICD-10-CM | POA: Diagnosis not present

## 2016-03-20 ENCOUNTER — Telehealth: Payer: Self-pay | Admitting: *Deleted

## 2016-03-20 NOTE — Telephone Encounter (Signed)
Requesting surgical clearance:   1. Type of surgery: Vascectomy  2. Surgeon: Dr Rana Snare  3. Surgical date: pending  4. Medications that need to be held: Plavix  5. CAD: Yes     6. I will defer to: Dr. Ellyn Hack   [facility] [phone] Phone- [phone]

## 2016-03-20 NOTE — Telephone Encounter (Signed)
Encounter routed to number provided. 

## 2016-03-20 NOTE — Telephone Encounter (Signed)
He would be low risk for a low-risk surgery. Was doing well as of his March visit.  Okay to hold Plavix 5 days pre-op.  Would not need any further cardiac evaluation.  Glenetta Hew, MD

## 2016-04-15 IMAGING — CR DG ABDOMEN ACUTE W/ 1V CHEST
4 series · 4 of 4 positions shown · non-contrast
Comparison: 04/17/2013 chest x-ray

CLINICAL DATA: Abdominal pain

EXAM:
ACUTE ABDOMEN SERIES (ABDOMEN 2 VIEW & CHEST 1 VIEW)

[w chest pa]
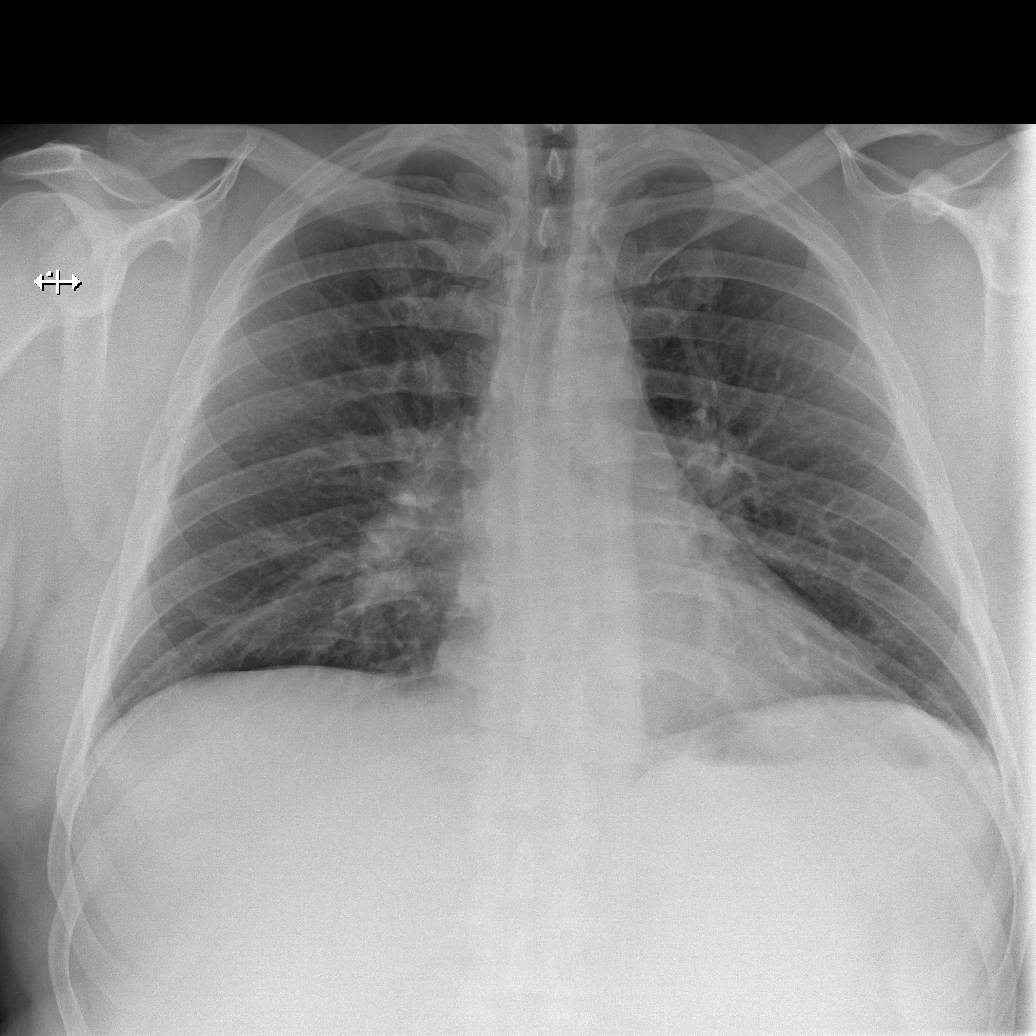

[w abdomen upright]
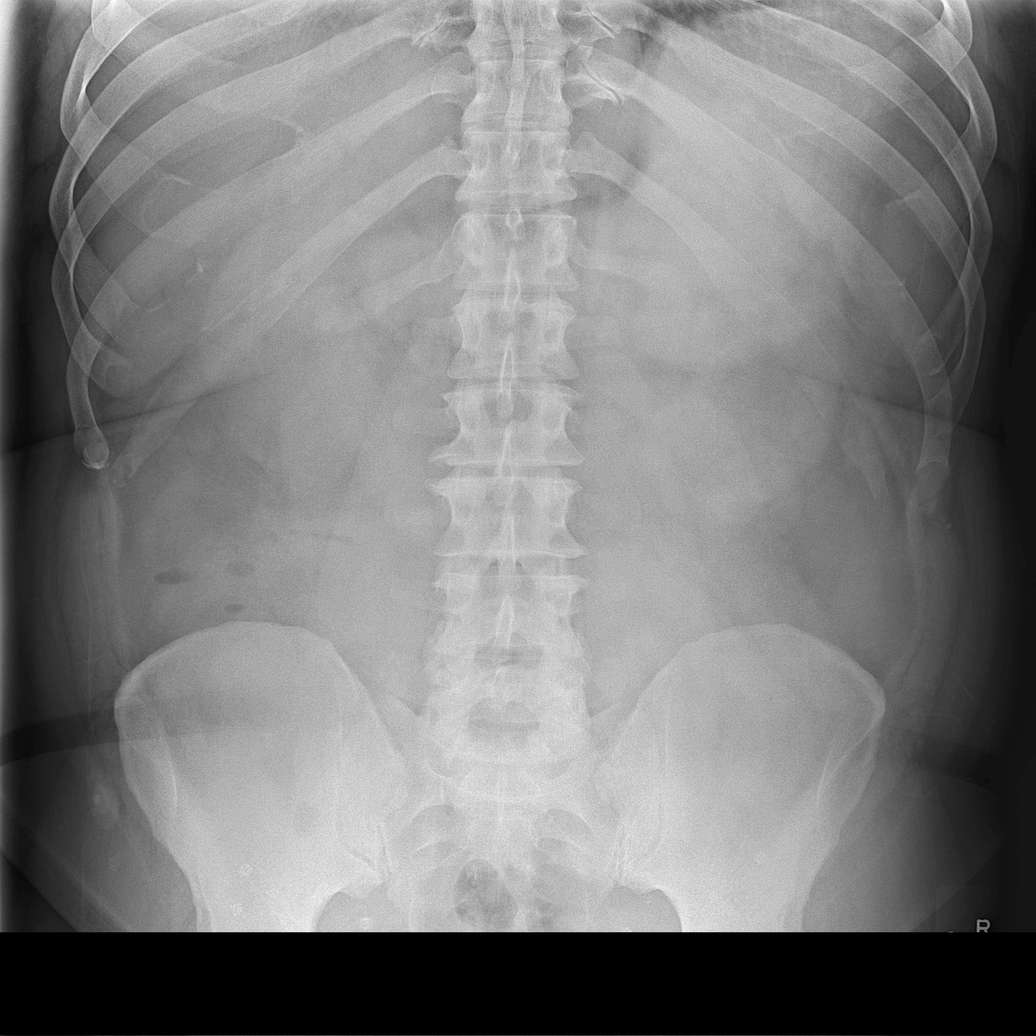

[t abdomen supine (1 of 2)]
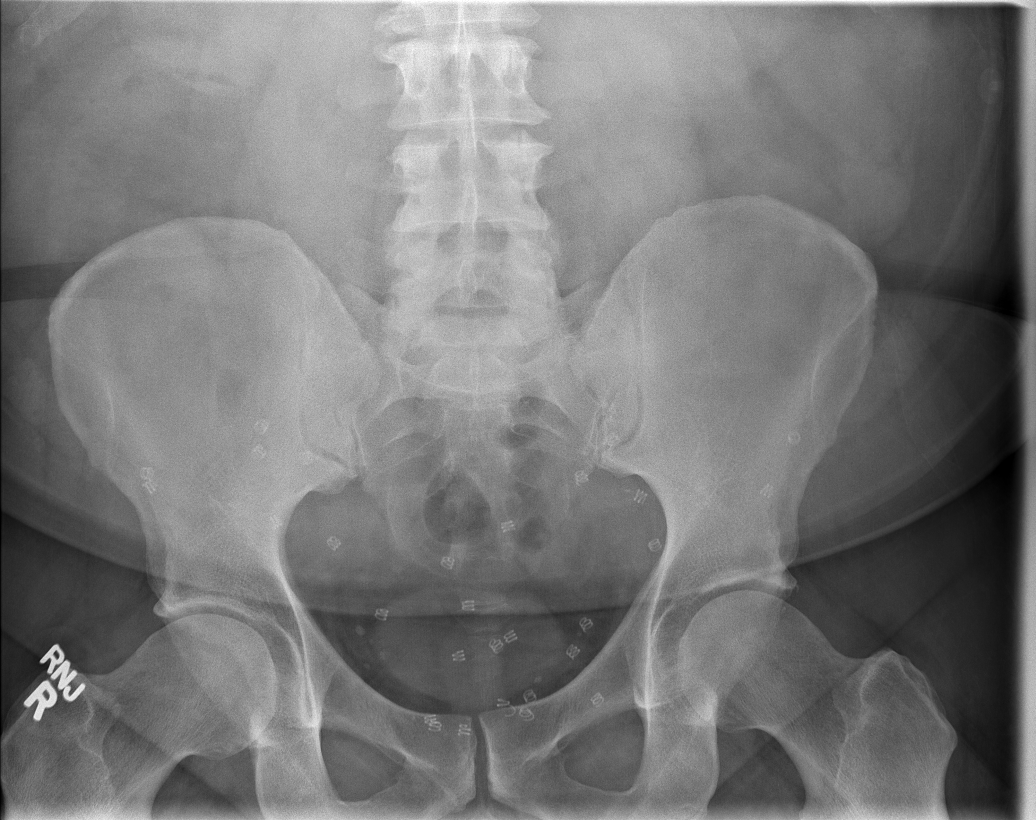

[t abdomen supine (2 of 2)]
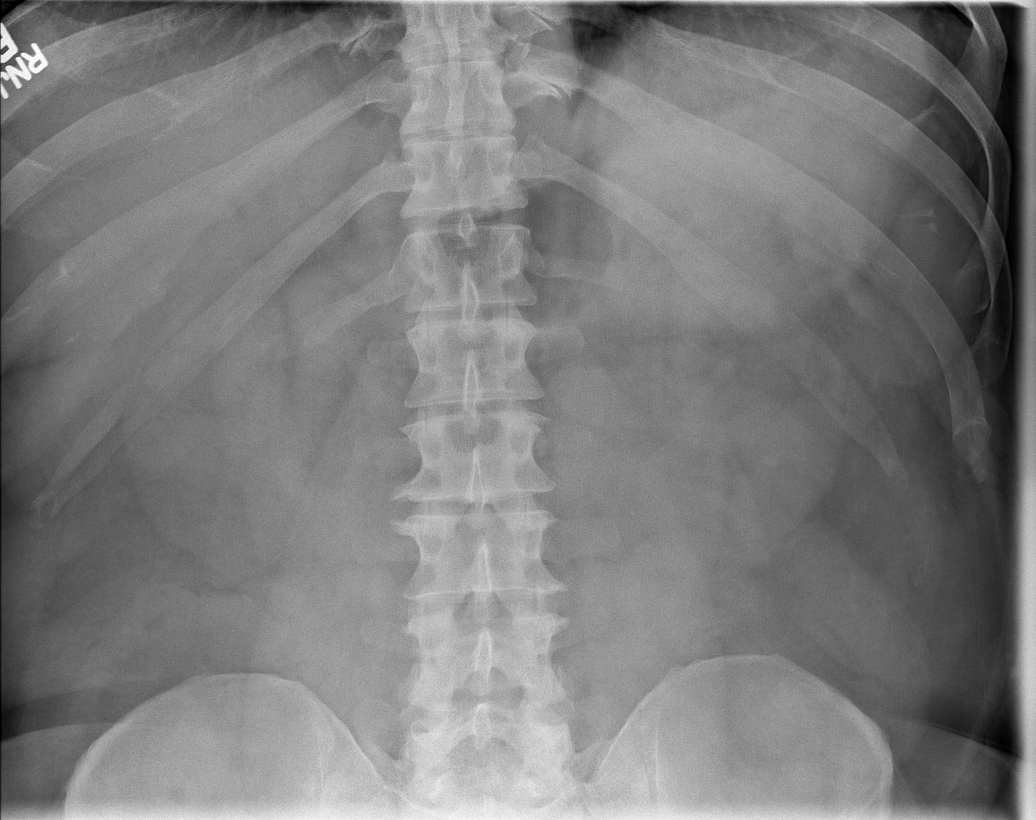

[4 of 4 positions shown; findings below may reference images not displayed]

FINDINGS: The abdomen is essentially gasless, which limits the utility of
radiography. Few bubbles noted in the right lower quadrant. There is
no concerning intra-abdominal mass effect or calcification. No
pneumoperitoneum. Previous lower abdominal wall mesh repair. Normal
heart size and mediastinal contours. No acute infiltrate or edema.
No effusion or pneumothorax. No acute osseous findings.
IMPRESSION: 1. There is no definitive obstruction, but this technique is limited
by relatively gasless abdomen. Fluid dilated loops of bowel would be
missed on this study.
2. Negative chest.

## 2016-04-22 IMAGING — CR DG ABD PORTABLE 1V
1 series · 1 of 1 positions shown · non-contrast
Comparison: August 10, 2014.

CLINICAL DATA: Small bowel obstruction.

EXAM:
PORTABLE ABDOMEN - 1 VIEW

[AP]
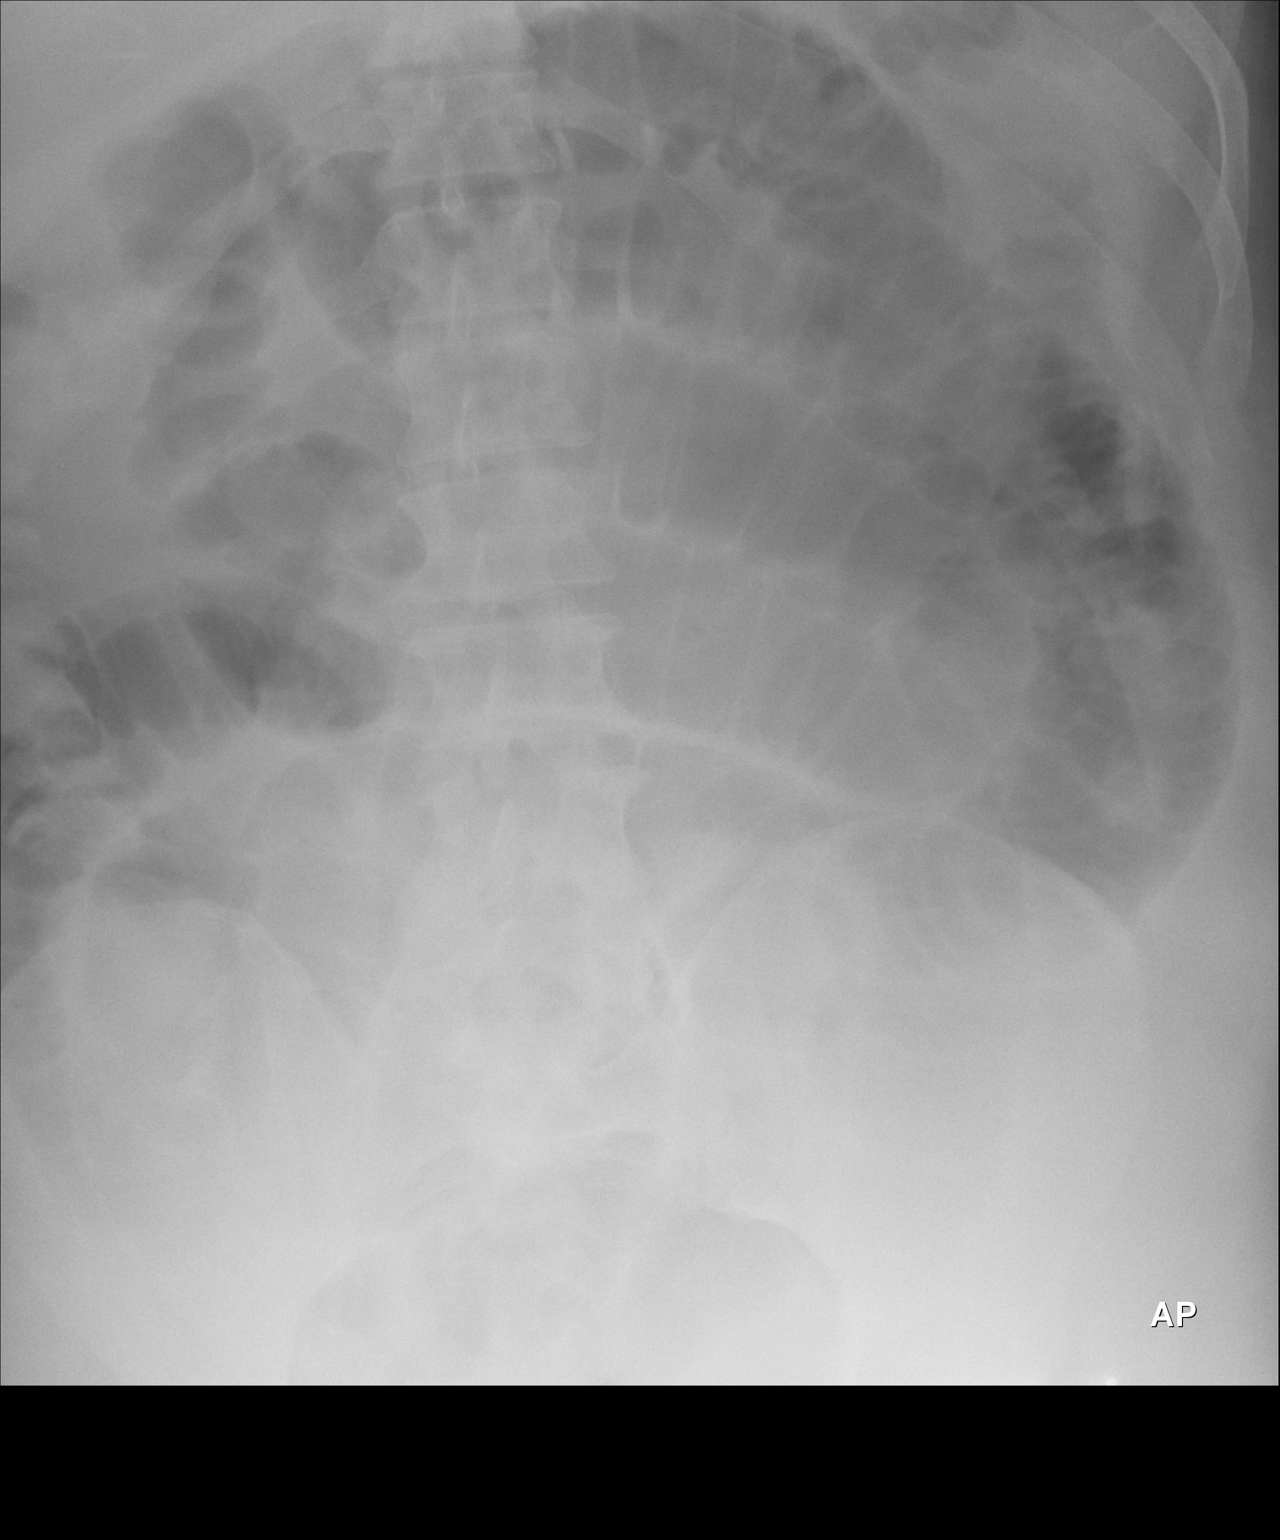

[1 of 1 positions shown; findings below may reference images not displayed]

FINDINGS: There remains multiple dilated loops of small bowel concerning for
distal small bowel obstruction. These are stable compared to prior
exam. No significant colonic dilatation is noted. No renal calculi
are noted.
IMPRESSION: Stable dilated small bowel loops are noted consistent with distal
small bowel obstruction. Continued radiographic follow-up is
recommended.

## 2016-05-07 ENCOUNTER — Other Ambulatory Visit: Payer: Self-pay | Admitting: Cardiology

## 2016-05-08 DIAGNOSIS — Z302 Encounter for sterilization: Secondary | ICD-10-CM | POA: Diagnosis not present

## 2016-05-26 ENCOUNTER — Encounter (HOSPITAL_COMMUNITY): Payer: Self-pay | Admitting: Emergency Medicine

## 2016-05-26 ENCOUNTER — Emergency Department (HOSPITAL_COMMUNITY)
Admission: EM | Admit: 2016-05-26 | Discharge: 2016-05-26 | Disposition: A | Discharge status: Home / Self Care | Payer: BLUE CROSS/BLUE SHIELD | Attending: Emergency Medicine | Admitting: Emergency Medicine

## 2016-05-26 ENCOUNTER — Emergency Department (HOSPITAL_COMMUNITY): Payer: BLUE CROSS/BLUE SHIELD

## 2016-05-26 DIAGNOSIS — Z8673 Personal history of transient ischemic attack (TIA), and cerebral infarction without residual deficits: Secondary | ICD-10-CM | POA: Insufficient documentation

## 2016-05-26 DIAGNOSIS — Z87891 Personal history of nicotine dependence: Secondary | ICD-10-CM | POA: Insufficient documentation

## 2016-05-26 DIAGNOSIS — I2510 Atherosclerotic heart disease of native coronary artery without angina pectoris: Secondary | ICD-10-CM | POA: Diagnosis not present

## 2016-05-26 DIAGNOSIS — R0602 Shortness of breath: Secondary | ICD-10-CM | POA: Diagnosis not present

## 2016-05-26 DIAGNOSIS — I10 Essential (primary) hypertension: Secondary | ICD-10-CM | POA: Insufficient documentation

## 2016-05-26 DIAGNOSIS — R079 Chest pain, unspecified: Secondary | ICD-10-CM | POA: Diagnosis not present

## 2016-05-26 DIAGNOSIS — Z5321 Procedure and treatment not carried out due to patient leaving prior to being seen by health care provider: Secondary | ICD-10-CM | POA: Diagnosis not present

## 2016-05-26 LAB — BASIC METABOLIC PANEL
ANION GAP: 7 (ref 5–15)
BUN: 12 mg/dL (ref 6–20)
CALCIUM: 9.1 mg/dL (ref 8.9–10.3)
CO2: 29 mmol/L (ref 22–32)
Chloride: 105 mmol/L (ref 101–111)
Creatinine, Ser: 1.1 mg/dL (ref 0.61–1.24)
Glucose, Bld: 217 mg/dL — ABNORMAL HIGH (ref 65–99)
POTASSIUM: 3.8 mmol/L (ref 3.5–5.1)
SODIUM: 141 mmol/L (ref 135–145)

## 2016-05-26 LAB — CBC
HEMATOCRIT: 42.7 % (ref 39.0–52.0)
HEMOGLOBIN: 14.5 g/dL (ref 13.0–17.0)
MCH: 29.5 pg (ref 26.0–34.0)
MCHC: 34 g/dL (ref 30.0–36.0)
MCV: 86.8 fL (ref 78.0–100.0)
Platelets: 229 10*3/uL (ref 150–400)
RBC: 4.92 MIL/uL (ref 4.22–5.81)
RDW: 13.2 % (ref 11.5–15.5)
WBC: 5.7 10*3/uL (ref 4.0–10.5)

## 2016-05-26 LAB — I-STAT TROPONIN, ED: TROPONIN I, POC: 0 ng/mL (ref 0.00–0.08)

## 2016-05-26 LAB — PROTIME-INR
INR: 0.95
PROTHROMBIN TIME: 12.6 s (ref 11.4–15.2)

## 2016-05-26 NOTE — ED Triage Notes (Signed)
Patient reports intermittent left chest pain onset 3 days ago with SOB and diaphoresis , denies emesis , history of CAD / Coronary stent - his cardiologist is Dr. Ellyn Hack .

## 2016-05-26 NOTE — ED Notes (Signed)
Pt states that he feels better and no longer wants to stay, pt advised that he is next to go to a room. Pt states he will follow up with his cardiologist in the morning and understands risk of leaving.

## 2016-05-27 ENCOUNTER — Other Ambulatory Visit: Payer: Self-pay | Admitting: *Deleted

## 2016-05-28 ENCOUNTER — Telehealth: Payer: Self-pay | Admitting: Cardiology

## 2016-05-28 ENCOUNTER — Ambulatory Visit: Payer: Self-pay | Admitting: Cardiology

## 2016-05-28 MED ORDER — EVOLOCUMAB 140 MG/ML ~~LOC~~ SOAJ: 140.0000 mg | SUBCUTANEOUS | 11 refills | Status: DC

## 2016-05-28 NOTE — Telephone Encounter (Signed)
Patient c/o Palpitations:  High priority if patient c/o lightheadedness and shortness of breath.  1. How long have you been having palpitations? 5-7 days   2. Are you currently experiencing lightheadedness and shortness of breath? lightheaded  3. Have you checked your BP and heart rate? (document readings) hr 78  4. Are you experiencing any other symptoms? Feels like he is going to pass out   PACs every other beat

## 2016-05-28 NOTE — Telephone Encounter (Addendum)
Pt of Dr. Ellyn Hack, hx NSTEMI, CAD/PCI, PVCs/PACs.  Spoke w patient concerning increase in his PACs/PVCs x5-6 days. He reports he was seen in ER 2 nights ago, CXR OK and trop neg, he was sent home w instruction to follow up w cardiology. Spoke w Erasmo Downer yesterday, who made him appt w Dr. Ellyn Hack for Nov 10. Patient felt he could not wait 2 weeks to be seen, now reporting fatigue w his symptoms.  Patient agreeable to being seen tomorrow, I scheduled for open appt slot w Dr. Ellyn Hack. He is asking what to do in interim. Notes BP at 148/79, HR 74. Heart rate 70s to 80s when checked.  Takes metoprolol succ 50mg  BID. Advised he could take extra 1/2 dose of his metoprolol succinate today to see if this helps w symptoms - he is aware this is a long-acting med and thus hard to estimate if he will notice improvement. Will defer to Dr. Ellyn Hack to advise on further med changes pending outcome of visit.  Sched for 10/26 at 10:30am, appt info given to patient.

## 2016-05-28 NOTE — Telephone Encounter (Signed)
Follow up    Could not reach triage I will mark as high priority for pt

## 2016-05-29 ENCOUNTER — Telehealth: Payer: Self-pay | Admitting: Cardiology

## 2016-05-29 ENCOUNTER — Ambulatory Visit (INDEPENDENT_AMBULATORY_CARE_PROVIDER_SITE_OTHER): Payer: BLUE CROSS/BLUE SHIELD | Admitting: Cardiology

## 2016-05-29 ENCOUNTER — Encounter: Payer: Self-pay | Admitting: Cardiology

## 2016-05-29 VITALS — BP 110/80 | HR 72 | Ht 74.0 in | Wt 288.4 lb

## 2016-05-29 DIAGNOSIS — I10 Essential (primary) hypertension: Secondary | ICD-10-CM | POA: Diagnosis not present

## 2016-05-29 DIAGNOSIS — I491 Atrial premature depolarization: Secondary | ICD-10-CM | POA: Diagnosis not present

## 2016-05-29 DIAGNOSIS — E785 Hyperlipidemia, unspecified: Secondary | ICD-10-CM | POA: Diagnosis not present

## 2016-05-29 DIAGNOSIS — I214 Non-ST elevation (NSTEMI) myocardial infarction: Secondary | ICD-10-CM

## 2016-05-29 DIAGNOSIS — I2510 Atherosclerotic heart disease of native coronary artery without angina pectoris: Secondary | ICD-10-CM | POA: Diagnosis not present

## 2016-05-29 DIAGNOSIS — Z9861 Coronary angioplasty status: Secondary | ICD-10-CM | POA: Diagnosis not present

## 2016-05-29 LAB — COMPREHENSIVE METABOLIC PANEL
ALBUMIN: 3.9 g/dL (ref 3.6–5.1)
ALT: 22 U/L (ref 9–46)
AST: 20 U/L (ref 10–35)
Alkaline Phosphatase: 83 U/L (ref 40–115)
BUN: 17 mg/dL (ref 7–25)
CHLORIDE: 104 mmol/L (ref 98–110)
CO2: 27 mmol/L (ref 20–31)
CREATININE: 1.14 mg/dL (ref 0.70–1.33)
Calcium: 9 mg/dL (ref 8.6–10.3)
Glucose, Bld: 98 mg/dL (ref 65–99)
POTASSIUM: 4.5 mmol/L (ref 3.5–5.3)
SODIUM: 140 mmol/L (ref 135–146)
Total Bilirubin: 0.4 mg/dL (ref 0.2–1.2)
Total Protein: 6.7 g/dL (ref 6.1–8.1)

## 2016-05-29 MED ORDER — METOPROLOL TARTRATE 25 MG PO TABS: 25.0000 mg | ORAL_TABLET | ORAL | 4 refills | Status: DC | PRN

## 2016-05-29 MED ORDER — METOPROLOL TARTRATE 25 MG PO TABS: 25.0000 mg | ORAL_TABLET | ORAL | 3 refills | Status: DC | PRN

## 2016-05-29 NOTE — Patient Instructions (Addendum)
PLEASE HAVE DONE - CMP ,MAGNESIUM  Change dosing interval of Toprol to take the evening dose at bedtime and the morning dose at roughly 8 AM.  CONTINUE METOPROLOL SUCCINATE AND IN ADDITON MAY USE LOPRESSOR( METOPROLOL TART) 25 MG  EVERY 4 HOURS AS NEED FOR PALPATIONS   Your physician has requested that you have an exercise tolerance test. For further information please visit [email or web address]. Please also follow instruction sheet, as given.  Your physician recommends that you schedule a follow-up appointment in:1 month with DR HARDING.  If you need a refill on your cardiac medications before your next appointment, please call your pharmacy.
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
## 2016-05-29 NOTE — Telephone Encounter (Signed)
Dylan [name] from [employer] is calling concerning a prior auth for pt's Evolocumab 140 MG/ML SOAJ [id number]  --Artis Delay has faxed this to be completed, if any questions please call [phone]

## 2016-05-29 NOTE — Assessment & Plan Note (Signed)
Not really having any significant anginal symptoms. Regarding a GXT which will hopefully evaluate for potential ischemic etiology for PACs and PVCs

## 2016-05-29 NOTE — Assessment & Plan Note (Signed)
Excellent blood pressure on current meds

## 2016-05-29 NOTE — Assessment & Plan Note (Signed)
Up until his PACs he was doing quite well. He did stop his Plavix for vasectomy procedure, but is back on now. This is the only concern about potentially being related to having illness Plavix and therefore we are doing a GXT. Otherwise he is on a good dose of beta blocker and not requiring any nitroglycerin. Is on Benicar. Because of statin intolerance he is now on Repatha has had excellent response.

## 2016-05-29 NOTE — Assessment & Plan Note (Signed)
Excellent response to Repatha - main issue is that the financial concerns with obtaining the medicine.Marland Kitchen

## 2016-05-29 NOTE — Assessment & Plan Note (Addendum)
These seem to be driven by increased stress and fatigue with low sleep. He is artery on 50 twice a day of Toprol, but I still think with stress being the driving force that the beta blocker will be the best option for when necessary. Plan:   Change dosing interval of Toprol to take the evening dose at bedtime and the morning dose at roughly 8 AM.  Use when necessary short acting Lopressor 25 mg every 4 hours for palpitations  Will order GXT to assess baseline PACs/PVCs and no response to exercise. The intention here to see if these premature beats increase with exercise, then I would be more concerned about possible ischemic cause with him having recently stopped Plavix.  We will also check chemistry found to evaluate his magnesium calcium potassium as well as other other values to ensure there is no light laterality is leading to his PACs and PVCs with her being on Lasix.

## 2016-05-30 ENCOUNTER — Telehealth (HOSPITAL_COMMUNITY): Payer: Self-pay

## 2016-05-30 LAB — MAGNESIUM: Magnesium: 2 mg/dL (ref 1.5–2.5)

## 2016-05-30 NOTE — Telephone Encounter (Signed)
Encounter complete. 

## 2016-05-30 NOTE — Telephone Encounter (Signed)
Prescription sent

## 2016-06-02 NOTE — Telephone Encounter (Signed)
Follow up   Dylan [name] is calling to check on the follow up of the medication request of Repatha

## 2016-06-02 NOTE — Telephone Encounter (Signed)
Prior auth needed for continuation by insurance.  Pt will come by tomorrow am for lab draw.  Once those results are in, will re-request med thru insurer.    Patient voiced understanding

## 2016-06-03 ENCOUNTER — Other Ambulatory Visit: Payer: Self-pay | Admitting: Cardiology

## 2016-06-03 ENCOUNTER — Ambulatory Visit (HOSPITAL_COMMUNITY)
Admission: RE | Admit: 2016-06-03 | Discharge: 2016-06-03 | Disposition: A | Discharge status: Home / Self Care | Payer: BLUE CROSS/BLUE SHIELD | Source: Ambulatory Visit | Attending: Cardiovascular Disease | Admitting: Cardiovascular Disease

## 2016-06-03 DIAGNOSIS — I491 Atrial premature depolarization: Secondary | ICD-10-CM | POA: Diagnosis not present

## 2016-06-03 DIAGNOSIS — E785 Hyperlipidemia, unspecified: Secondary | ICD-10-CM | POA: Insufficient documentation

## 2016-06-03 DIAGNOSIS — I214 Non-ST elevation (NSTEMI) myocardial infarction: Secondary | ICD-10-CM | POA: Diagnosis not present

## 2016-06-03 DIAGNOSIS — I2510 Atherosclerotic heart disease of native coronary artery without angina pectoris: Secondary | ICD-10-CM | POA: Diagnosis not present

## 2016-06-03 DIAGNOSIS — R9439 Abnormal result of other cardiovascular function study: Secondary | ICD-10-CM | POA: Insufficient documentation

## 2016-06-03 DIAGNOSIS — I10 Essential (primary) hypertension: Secondary | ICD-10-CM | POA: Diagnosis not present

## 2016-06-03 DIAGNOSIS — Z9861 Coronary angioplasty status: Secondary | ICD-10-CM

## 2016-06-03 LAB — HEPATIC FUNCTION PANEL
ALBUMIN: 4.1 g/dL (ref 3.6–5.1)
ALT: 25 U/L (ref 9–46)
AST: 20 U/L (ref 10–35)
Alkaline Phosphatase: 75 U/L (ref 40–115)
Bilirubin, Direct: 0.1 mg/dL (ref <=0.2)
Indirect Bilirubin: 0.5 mg/dL (ref 0.2–1.2)
TOTAL PROTEIN: 6.6 g/dL (ref 6.1–8.1)
Total Bilirubin: 0.6 mg/dL (ref 0.2–1.2)

## 2016-06-03 LAB — EXERCISE TOLERANCE TEST
CHL CUP MPHR: 169 {beats}/min
CHL CUP RESTING HR STRESS: 75 {beats}/min
CHL RATE OF PERCEIVED EXERTION: 18
CSEPED: 6 min
Estimated workload: 8.3 METS
Exercise duration (sec): 51 s
Peak HR: 155 {beats}/min
Percent HR: 91 %

## 2016-06-03 LAB — LIPID PANEL
CHOL/HDL RATIO: 6.3 ratio — AB (ref <=5.0)
CHOLESTEROL: 208 mg/dL — AB (ref 125–200)
HDL: 33 mg/dL — AB (ref >=40)
LDL Cholesterol: 148 mg/dL — ABNORMAL HIGH (ref <130)
TRIGLYCERIDES: 134 mg/dL (ref <150)
VLDL: 27 mg/dL (ref <30)

## 2016-06-04 ENCOUNTER — Encounter: Payer: Self-pay | Admitting: Cardiology

## 2016-06-11 ENCOUNTER — Telehealth: Payer: Self-pay | Admitting: Pharmacist Clinician (PhC)/ Clinical Pharmacy Specialist

## 2016-06-11 ENCOUNTER — Telehealth: Payer: Self-pay | Admitting: Cardiology

## 2016-06-11 MED ORDER — EVOLOCUMAB 140 MG/ML ~~LOC~~ SOAJ: 140.0000 mg | SUBCUTANEOUS | 11 refills | Status: DC

## 2016-06-11 NOTE — Telephone Encounter (Signed)
Spoke with Erlene Quan at pt mail order pharmacy about pt Metoprolol tart, pt to take as needed for palpitation.

## 2016-06-11 NOTE — Telephone Encounter (Signed)
Pharmacy has question regrading recent request for refill of Metoprolol 25mg -pls call

## 2016-06-11 NOTE — Telephone Encounter (Signed)
Repatha approved until 4/30/8.  Sample given, prescription called to [facility], and $5 copay card given

## 2016-06-13 ENCOUNTER — Ambulatory Visit: Payer: Self-pay | Admitting: Cardiology

## 2016-07-08 ENCOUNTER — Other Ambulatory Visit (HOSPITAL_COMMUNITY): Payer: Self-pay | Admitting: Nurse Practitioner

## 2016-07-22 ENCOUNTER — Telehealth: Payer: Self-pay | Admitting: Cardiology

## 2016-07-22 ENCOUNTER — Emergency Department (HOSPITAL_COMMUNITY): Payer: BLUE CROSS/BLUE SHIELD

## 2016-07-22 ENCOUNTER — Observation Stay (HOSPITAL_COMMUNITY)
Admission: EM | Admit: 2016-07-22 | Discharge: 2016-07-24 | Disposition: A | Discharge status: Home / Self Care | Payer: BLUE CROSS/BLUE SHIELD | Attending: Internal Medicine | Admitting: Internal Medicine

## 2016-07-22 ENCOUNTER — Encounter (HOSPITAL_COMMUNITY): Payer: Self-pay | Admitting: Emergency Medicine

## 2016-07-22 DIAGNOSIS — I5189 Other ill-defined heart diseases: Secondary | ICD-10-CM | POA: Diagnosis present

## 2016-07-22 DIAGNOSIS — Z955 Presence of coronary angioplasty implant and graft: Secondary | ICD-10-CM | POA: Insufficient documentation

## 2016-07-22 DIAGNOSIS — E785 Hyperlipidemia, unspecified: Secondary | ICD-10-CM | POA: Diagnosis not present

## 2016-07-22 DIAGNOSIS — R739 Hyperglycemia, unspecified: Secondary | ICD-10-CM | POA: Diagnosis not present

## 2016-07-22 DIAGNOSIS — I110 Hypertensive heart disease with heart failure: Secondary | ICD-10-CM | POA: Diagnosis not present

## 2016-07-22 DIAGNOSIS — E669 Obesity, unspecified: Secondary | ICD-10-CM | POA: Diagnosis not present

## 2016-07-22 DIAGNOSIS — R079 Chest pain, unspecified: Principal | ICD-10-CM | POA: Diagnosis present

## 2016-07-22 DIAGNOSIS — R002 Palpitations: Secondary | ICD-10-CM

## 2016-07-22 DIAGNOSIS — I252 Old myocardial infarction: Secondary | ICD-10-CM | POA: Diagnosis not present

## 2016-07-22 DIAGNOSIS — Z791 Long term (current) use of non-steroidal anti-inflammatories (NSAID): Secondary | ICD-10-CM | POA: Diagnosis not present

## 2016-07-22 DIAGNOSIS — R0602 Shortness of breath: Secondary | ICD-10-CM | POA: Diagnosis not present

## 2016-07-22 DIAGNOSIS — I5032 Chronic diastolic (congestive) heart failure: Secondary | ICD-10-CM | POA: Diagnosis not present

## 2016-07-22 DIAGNOSIS — Z79899 Other long term (current) drug therapy: Secondary | ICD-10-CM | POA: Diagnosis not present

## 2016-07-22 DIAGNOSIS — Z6837 Body mass index (BMI) 37.0-37.9, adult: Secondary | ICD-10-CM | POA: Diagnosis not present

## 2016-07-22 DIAGNOSIS — Z9861 Coronary angioplasty status: Secondary | ICD-10-CM

## 2016-07-22 DIAGNOSIS — I2510 Atherosclerotic heart disease of native coronary artery without angina pectoris: Secondary | ICD-10-CM | POA: Diagnosis not present

## 2016-07-22 DIAGNOSIS — I10 Essential (primary) hypertension: Secondary | ICD-10-CM | POA: Diagnosis present

## 2016-07-22 DIAGNOSIS — Z7902 Long term (current) use of antithrombotics/antiplatelets: Secondary | ICD-10-CM | POA: Insufficient documentation

## 2016-07-22 DIAGNOSIS — Z8249 Family history of ischemic heart disease and other diseases of the circulatory system: Secondary | ICD-10-CM | POA: Diagnosis not present

## 2016-07-22 DIAGNOSIS — R0789 Other chest pain: Secondary | ICD-10-CM | POA: Diagnosis not present

## 2016-07-22 LAB — CBC
HEMATOCRIT: 42 % (ref 39.0–52.0)
HEMOGLOBIN: 14.6 g/dL (ref 13.0–17.0)
MCH: 29.8 pg (ref 26.0–34.0)
MCHC: 34.8 g/dL (ref 30.0–36.0)
MCV: 85.7 fL (ref 78.0–100.0)
Platelets: 225 10*3/uL (ref 150–400)
RBC: 4.9 MIL/uL (ref 4.22–5.81)
RDW: 12.9 % (ref 11.5–15.5)
WBC: 6.5 10*3/uL (ref 4.0–10.5)

## 2016-07-22 LAB — BASIC METABOLIC PANEL
ANION GAP: 7 (ref 5–15)
BUN: 16 mg/dL (ref 6–20)
CHLORIDE: 106 mmol/L (ref 101–111)
CO2: 27 mmol/L (ref 22–32)
Calcium: 9.9 mg/dL (ref 8.9–10.3)
Creatinine, Ser: 1 mg/dL (ref 0.61–1.24)
GFR calc Af Amer: >60 mL/min (ref >60)
GLUCOSE: 167 mg/dL — AB (ref 65–99)
POTASSIUM: 4 mmol/L (ref 3.5–5.1)
Sodium: 140 mmol/L (ref 135–145)

## 2016-07-22 LAB — MAGNESIUM: Magnesium: 1.9 mg/dL (ref 1.7–2.4)

## 2016-07-22 LAB — I-STAT TROPONIN, ED: Troponin i, poc: 0 ng/mL (ref 0.00–0.08)

## 2016-07-22 LAB — TROPONIN I

## 2016-07-22 MED ORDER — MAGNESIUM SULFATE 2 GM/50ML IV SOLN
2.0000 g | Freq: Once | INTRAVENOUS | Status: AC
  Administered 2016-07-23: 2 g via INTRAVENOUS
  Filled 2016-07-22: qty 50

## 2016-07-22 MED ORDER — HYDROMORPHONE HCL 2 MG/ML IJ SOLN
1.0000 mg | Freq: Once | INTRAMUSCULAR | Status: AC
  Administered 2016-07-22: 1 mg via INTRAVENOUS
  Filled 2016-07-22: qty 1

## 2016-07-22 MED ORDER — METOPROLOL TARTRATE 25 MG PO TABS: 25.0000 mg | ORAL_TABLET | ORAL | Status: DC | PRN

## 2016-07-22 MED ORDER — CLOPIDOGREL BISULFATE 75 MG PO TABS
75.0000 mg | ORAL_TABLET | Freq: Every day | ORAL | Status: DC
  Administered 2016-07-23 – 2016-07-24 (×2): 75 mg via ORAL
  Filled 2016-07-22 (×2): qty 1

## 2016-07-22 MED ORDER — NITROGLYCERIN 0.4 MG SL SUBL: 0.4000 mg | SUBLINGUAL_TABLET | SUBLINGUAL | Status: DC | PRN

## 2016-07-22 MED ORDER — METOPROLOL SUCCINATE ER 50 MG PO TB24
50.0000 mg | ORAL_TABLET | Freq: Every day | ORAL | Status: DC
  Administered 2016-07-23: 50 mg via ORAL
  Filled 2016-07-22: qty 1

## 2016-07-22 MED ORDER — FAMOTIDINE 20 MG PO TABS
40.0000 mg | ORAL_TABLET | Freq: Once | ORAL | Status: AC
  Administered 2016-07-22: 40 mg via ORAL
  Filled 2016-07-22 (×2): qty 2

## 2016-07-22 MED ORDER — IRBESARTAN 150 MG PO TABS
150.0000 mg | ORAL_TABLET | Freq: Every day | ORAL | Status: DC
  Administered 2016-07-23 – 2016-07-24 (×2): 150 mg via ORAL
  Filled 2016-07-22 (×2): qty 1

## 2016-07-22 MED ORDER — ACETAMINOPHEN 325 MG PO TABS: 650.0000 mg | ORAL_TABLET | Freq: Four times a day (QID) | ORAL | Status: DC | PRN

## 2016-07-22 MED ORDER — IBUPROFEN 200 MG PO TABS
400.0000 mg | ORAL_TABLET | Freq: Four times a day (QID) | ORAL | Status: DC | PRN
Start: 2016-07-22 — End: 2016-07-24
  Administered 2016-07-22: 400 mg via ORAL
  Filled 2016-07-22: qty 2

## 2016-07-22 MED ORDER — SODIUM CHLORIDE 0.9% FLUSH
3.0000 mL | Freq: Two times a day (BID) | INTRAVENOUS | Status: DC
  Administered 2016-07-22 – 2016-07-23 (×2): 3 mL via INTRAVENOUS

## 2016-07-22 MED ORDER — NITROGLYCERIN 0.4 MG SL SUBL
0.4000 mg | SUBLINGUAL_TABLET | SUBLINGUAL | Status: DC | PRN
  Administered 2016-07-22: 0.4 mg via SUBLINGUAL
  Filled 2016-07-22: qty 1

## 2016-07-22 MED ORDER — POTASSIUM CHLORIDE 20 MEQ PO PACK
20.0000 meq | PACK | Freq: Once | ORAL | Status: AC
  Administered 2016-07-22: 20 meq via ORAL
  Filled 2016-07-22: qty 1

## 2016-07-22 MED ORDER — ASPIRIN 81 MG PO CHEW
324.0000 mg | CHEWABLE_TABLET | Freq: Once | ORAL | Status: AC
Start: 2016-07-22 — End: 2016-07-22
  Administered 2016-07-22: 324 mg via ORAL
  Filled 2016-07-22: qty 4

## 2016-07-22 MED ORDER — ONDANSETRON HCL 4 MG/2ML IJ SOLN: 4.0000 mg | Freq: Four times a day (QID) | INTRAMUSCULAR | Status: DC | PRN

## 2016-07-22 MED ORDER — ENOXAPARIN SODIUM 40 MG/0.4ML ~~LOC~~ SOLN: 40.0000 mg | SUBCUTANEOUS | Status: DC

## 2016-07-22 MED ORDER — FUROSEMIDE 40 MG PO TABS
40.0000 mg | ORAL_TABLET | Freq: Two times a day (BID) | ORAL | Status: DC
  Administered 2016-07-23 – 2016-07-24 (×3): 40 mg via ORAL
  Filled 2016-07-22 (×3): qty 1

## 2016-07-22 MED ORDER — ENOXAPARIN SODIUM 40 MG/0.4ML ~~LOC~~ SOLN
40.0000 mg | SUBCUTANEOUS | Status: DC
  Administered 2016-07-22: 40 mg via SUBCUTANEOUS
  Filled 2016-07-22: qty 0.4

## 2016-07-22 NOTE — ED Provider Notes (Signed)
[facility] DEPT Provider Note   CSN: [id number] Arrival date & time: 07/22/16  1547     History   Chief Complaint Chief Complaint  Patient presents with  . Chest Pain    HPI Dylan [name] is a 50 y.o. male.  Dylan [name] is a 50 y.o. Male with a history of NSTEMI with stenting, HLD, HTN, and former smoker who presents to the ED complaining of intermittent chest pain and SOB. Patient reports over the past three days he's had some intermittent chest pain with some associated shortness of breath. He reports the pain is substernal and left-sided and describes it as a pressure. He reports at times he feels sweaty with this pain. He reports the pain can be with exertion and sometimes at rest. While interviewing the patient he tells me he has some pain that then subsides. He also tells me he's had frequent PACs recently. He says that he takes Lopressor and this seems to improve the palpitations temporarily that then return. He also complains of some intermittent nausea with this but no vomiting. No abdominal pain. He is followed by cardiologist Dr. Ellyn Hack. No treatments prior to arrival. He denies fevers, coughing, wheezing, leg pain, leg swelling, syncope, lightheadedness, dizziness, abdominal pain, vomiting, diarrhea, urinary symptoms or rashes.   The history is provided by the patient and medical records. No language interpreter was used.  Chest Pain   Associated symptoms include diaphoresis, nausea, palpitations and shortness of breath. Pertinent negatives include no abdominal pain, no back pain, no cough, no fever, no headaches, no vomiting and no weakness.    Past Medical History:  Diagnosis Date  . CAD S/P percutaneous coronary angioplasty 07/2010; 07/2012; 08/2014   1) 2/'11: MI - 100% RCA - PCI (3 Taxus ION DES 3.0 x 28, 3.0 x 24 & 3.5 x 12 distal-prox); b) ISR of RCA stent -AnngioSculpt PTCA; c) NSTEMI 1/'16: mild ISR with Thrombosis of RCA Stents (in setting of  Mesenteric Vein Thrombosis) - Cutting Balloon PTCA; c. Re-look Cath 07/2015: ~15% ISR in RCA,20% p-mLAD & o-pCx.  . Coronary stent thrombosis 08/15/2014  . Essential hypertension 07/07/2012  . Finding of multiple premature atrial contractions by electrocardiography    Symptomatic palpitations ; worse with poor sleep and fatigue along with dehydration.   . Hyperlipidemia with target LDL less than 70 09/01/2014  . Mesenteric vein thrombosis   . Migraines    "maybe one/yr" (07/06/2012)  . NSTEMI (non-ST elevated myocardial infarction) ([place]) 07/2010, 08/2014   And unstable angina in February 20 13th; Echo 1/12/'16: Moderate concentric LVH. EF 60-65% with no regional WMA. Gr 1 DD, otherwise normal  . Stroke ([place]) 2011; 2013   S/P cardiac cath - embolic stroke; denies residual (07/06/2012)  . 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 04/19/2013   No evidence of CVA on MRI/MRA & Dliated Eye Examination. ruptured blood vessel & not occlusion.    Patient Active Problem List   Diagnosis Date Noted  . Chest pain 07/22/2016  . Finding of multiple premature atrial contractions by electrocardiography 10/05/2015  . Precordial pain   . Iron deficiency anemia 09/29/2014  . Obesity (BMI 30-39.9) 09/09/2014  . Perforation bowel - 2/2 Mesenteric Ischemia from Mesenteric Vein Thrombosis.   . Intestinal perforation ([place]) 08/26/2014  . Coronary stent thrombosis 08/15/2014  . Acute embolism and thrombosis of vein   . Mesenteric vein thrombosis   .  Superior mesenteric vein thrombosis ([place]) 08/05/2014  . Portal vein thrombosis 08/05/2014  . 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 04/19/2013  . Possible Retinal artery branch occlusion of right eye = POST CATHETERIZATION -- by dilated Eye exam - no evidence of embolic  occlusoin; possible microperforation. NOT CVA 04/19/2013    Class: Acute  . Hypertriglyceridemia - TG >500 04/19/2013  . Dyslipidemia, statin intol 07/07/2012  . Essential hypertension 07/07/2012  . Smoker, quit 06/30/10 07/07/2012  . CAD S/P PCI with 3 DES stents to RCA and PTCA x2 for ISR and thrombosis  07/06/2012  . H/O: CVA (cerebrovascular accident), post cardiac cath, minimal speech residual 11/11 post PCI 07/06/2012  . NSTEMI (non-ST elevated myocardial infarction) - 07/2010, 2013, 08/2014 07/04/2010    Past Surgical History:  Procedure Laterality Date  . BOWEL RESECTION N/A 08/28/2014   Procedure: SMALL BOWEL RESECTION;  Surgeon: [name], MD;  Location: [facility];  Service: General;  Laterality: N/A;  . CARDIAC CATHETERIZATION  04/18/2013  . CARDIAC CATHETERIZATION N/A 07/20/2015   Procedure: Left Heart Cath and Coronary Angiography;  Surgeon: [name], MD;  Location: [facility];  Service: Cardiovascular;  15% ISR in the RCA stent segment, 20% proximal-mid LAD as well as ostial and proximal circumflex. Normal LV function.  . CORONARY ANGIOPLASTY  07/06/2012; 08/15/2014   a) 12/'13: PTCA of 80% ISR mRCA - AngioSculpt; b) PTCA of  ISR/thrombosis  . CORONARY ANGIOPLASTY WITH STENT PLACEMENT  07/2010   inferior wall MI - 3 Taxus Ion DES (3.0x11mm, 3.0x63mm, 3.5x50mm) to prox and mid RCA  . LAPAROSCOPIC APPENDECTOMY N/A 08/28/2014   Procedure: APPENDECTOMY LAPAROSCOPIC;  Surgeon: [name], MD;  Location: [facility];  Service: General;  Laterality: N/A;  . LAPAROSCOPY  08/28/2014   Procedure: LAPAROSCOPY DIAGNOSTIC;  Surgeon: [name], MD;  Location: [facility];  Service: General;;  . LEFT HEART CATHETERIZATION WITH CORONARY ANGIOGRAM N/A 06/11/2012   Procedure: LEFT HEART CATHETERIZATION WITH CORONARY ANGIOGRAM;  Surgeon: [name], MD;  Location: [facility];  Service: Cardiovascular;  Laterality: N/A;  . LEFT HEART CATHETERIZATION WITH CORONARY ANGIOGRAM N/A  04/18/2013   Procedure: LEFT HEART CATHETERIZATION WITH CORONARY ANGIOGRAM;  Surgeon: [name], MD;  Location: [facility] C[facility];  Service: Cardiovascular;  Laterality: N/A;  . LEFT HEART CATHETERIZATION WITH CORONARY ANGIOGRAM N/A 08/15/2014   Procedure: LEFT HEART CATHETERIZATION WITH CORONARY ANGIOGRAM;  Surgeon: [name], MD;  Location: [facility] CATH LAB;  Service: Cardiovascular;  Laterality: N/A;  . PERCUTANEOUS CORONARY STENT INTERVENTION (PCI-S) N/A 07/06/2012   Procedure: PERCUTANEOUS CORONARY STENT INTERVENTION (PCI-S);  Surgeon: [name], MD;  Location: [facility] CATH LAB;  Service: Cardiovascular;  Laterality: N/A;  . SEPTOPLASTY  2001  . TRANSTHORACIC ECHOCARDIOGRAM  08/15/2014   Moderate concentric LVH. EF 60-65% with no regional WMA. Gr 1 DD, otherwise normal       Home Medications    Prior to Admission medications   Medication Sig Start Date End Date Taking? Authorizing Provider  acetaminophen (TYLENOL) 500 MG tablet Take 1 tablet (500 mg total) by mouth every 4 (four) hours as needed for mild pain, fever or headache. 08/16/14   Cherene Altes, MD  BENICAR 20 MG tablet TAKE 1 TABLET(20 MG) BY MOUTH DAILY 11/12/15   Sherran Needs, NP  clopidogrel (PLAVIX) 75 MG tablet TAKE 1 TABLET(75 MG) BY MOUTH DAILY WITH BREAKFAST  10/22/15   [name], MD  Evolocumab 140 MG/ML SOAJ Inject 140 mg into the skin every 14 (fourteen) days. 06/11/16   [name], MD  furosemide (LASIX) 40 MG tablet Take 1 tablet (40 mg total) by mouth 2 (two) times daily. 10/03/15   [name], MD  metoprolol succinate (TOPROL-XL) 50 MG 24 hr tablet TAKE 1 TABLET BY MOUTH TWICE DAILY. TAKE WITH OR IMMEDIATELY FOLLOWING A MEAL. 05/07/16   [name], MD  metoprolol tartrate (LOPRESSOR) 25 MG tablet Take 1 tablet (25 mg total) by mouth every 4 (four) hours as needed. 05/29/16 08/27/16  [name], MD  nitroGLYCERIN (NITROSTAT) 0.4 MG SL tablet Place 1 tablet (0.4 mg total) under the tongue  every 5 (five) minutes x 3 doses as needed for chest pain. 10/03/15   [name], MD  olmesartan (BENICAR) 20 MG tablet TAKE 1 TABLET(20 MG) BY MOUTH DAILY 07/09/16   Sherran Needs, NP    Family History Family History  Problem Relation Age of Onset  . Atrial fibrillation Mother   . Mitral valve prolapse Mother   . Coronary artery disease Father     CABG, aortic aneursym,  . Heart attack Father   . Heart attack Paternal Uncle   . Heart attack Paternal Grandfather     Social History Social History  Substance Use Topics  . Smoking status: Former Smoker    Packs/day: 1.00    Years: 15.00    Types: Cigarettes    Quit date: 06/30/2010  . Smokeless tobacco: Former Systems developer    Types: [place] date: 06/30/2010  . Alcohol use 0.6 oz/week    1 Shots of liquor per week     Comment: a  glass of scotch per day     Allergies   Crestor [rosuvastatin]; Zofran [ondansetron hcl]; Ace inhibitors; Lipitor [atorvastatin]; and Codeine   Review of Systems Review of Systems  Constitutional: Positive for diaphoresis. Negative for chills and fever.  HENT: Negative for congestion and sore throat.   Eyes: Negative for visual disturbance.  Respiratory: Positive for shortness of breath. Negative for cough and wheezing.   Cardiovascular: Positive for chest pain and palpitations. Negative for leg swelling.  Gastrointestinal: Positive for nausea. Negative for abdominal pain, diarrhea and vomiting.  Genitourinary: Negative for dysuria.  Musculoskeletal: Negative for back pain and neck pain.  Skin: Negative for rash.  Neurological: Negative for syncope, weakness, light-headedness and headaches.     Physical Exam Updated Vital Signs BP 140/80   Pulse 61   Temp 98.5 F (36.9 C) (Oral)   Resp 10   SpO2 100%   Physical Exam  Constitutional: He is oriented to person, place, and time. He appears well-developed and well-nourished. No distress.  Nontoxic appearing.  HENT:  Head:  Normocephalic and atraumatic.  Mouth/Throat: Oropharynx is clear and moist.  Eyes: Conjunctivae are normal. Pupils are equal, round, and reactive to light. Right eye exhibits no discharge. Left eye exhibits no discharge.  Neck: Neck supple.  Cardiovascular: Normal rate, regular rhythm, normal heart sounds and intact distal pulses.  Exam reveals no gallop and no friction rub.   No murmur heard. Bilateral radial and posterior tibialis pulses are intact.  Pulmonary/Chest: Effort normal and breath sounds normal. No respiratory distress. He has no wheezes. He has no rales. He exhibits no tenderness.  Lungs clear to auscultation bilaterally. No chest wall tenderness to palpation.  Abdominal: Soft. He exhibits no distension. There  is no tenderness. There is no guarding.  Abdomen is soft and nontender to palpation.  Musculoskeletal: He exhibits edema. He exhibits no tenderness.  Mild bilateral ankle edema. No calf edema or tenderness.  Lymphadenopathy:    He has no cervical adenopathy.  Neurological: He is alert and oriented to person, place, and time. Coordination normal.  Skin: Skin is warm and dry. Capillary refill takes less than 2 seconds. No rash noted. He is not diaphoretic. No erythema. No pallor.  Psychiatric: He has a normal mood and affect. His behavior is normal.  Nursing note and vitals reviewed.    ED Treatments / Results  Labs (all labs ordered are listed, but only abnormal results are displayed) Labs Reviewed  BASIC METABOLIC PANEL - Abnormal; Notable for the following:       Result Value   Glucose, Bld 167 (*)    All other components within normal limits  CBC  I-STAT TROPOININ, ED    EKG  EKG Interpretation  Date/Time:  Tuesday July 22 2016 15:51:53 EST Ventricular Rate:  82 PR Interval:  172 QRS Duration: 76 QT Interval:  358 QTC Calculation: 418 R Axis:   49 Text Interpretation:  Normal sinus rhythm Low voltage QRS Borderline ECG No significant change since  last tracing Confirmed by [facility] MD, Junie Panning ([id number]) on 07/22/2016 7:17:45 PM       Radiology Dg Chest 2 View  Result Date: 07/22/2016 CLINICAL DATA:  Recent onset of left-sided sharp and pressure-like upper chest discomfort with nausea and shortness of breath. History of previous MI, coronary artery disease with stent placement, former smoker. EXAM: CHEST  2 VIEW COMPARISON:  Chest x-ray of May 26, 2016 FINDINGS: The lungs are adequately inflated. There is no focal infiltrate. There is no pleural effusion. The heart is normal in size. A coronary artery stent is visible. The pulmonary vascularity is normal. The mediastinum is normal in width. The bony thorax exhibits no acute abnormality. There are metallic coils present in the right upper quadrant from a previous embolization procedure. IMPRESSION: There is no active cardiopulmonary disease. Electronically Signed   By: David  Martinique M.D.   On: 07/22/2016 16:43    Procedures Procedures (including critical care time)  Medications Ordered in ED Medications  aspirin chewable tablet 324 mg (not administered)  nitroGLYCERIN (NITROSTAT) SL tablet 0.4 mg (0.4 mg Sublingual Given 07/22/16 2011)     Initial Impression / Assessment and Plan / ED Course  I have reviewed the triage vital signs and the nursing notes.  Pertinent labs & imaging results that were available during my care of the patient were reviewed by me and considered in my medical decision making (see chart for details).  Clinical Course    This is a 50 y.o. Male with a history of NSTEMI with stenting, HLD, HTN, and former smoker who presents to the ED complaining of intermittent chest pain and SOB. Patient reports over the past three days he's had some intermittent chest pain with some associated shortness of breath. He reports the pain is substernal and left-sided and describes it as a pressure. He reports at times he feels sweaty with this pain. He reports the pain can be  with exertion and sometimes at rest. While interviewing the patient he tells me he has some pain that then subsides. He also tells me he's had frequent PACs recently. He says that he takes Lopressor and this seems to improve the palpitations temporarily that then return. He also complains of some intermittent  nausea with this but no vomiting. No abdominal pain. He is followed by cardiologist Dr. Ellyn Hack.  On exam the patient is afebrile nontoxic appearing. Lungs are clear to auscultation bilaterally. Mild ankle edema bilaterally. No calf edema or tenderness. EKG unchanged from previous tracing. Chest x-ray unremarkable. Initial troponin is 0. CBC and BMP are unremarkable. Patient with a HEART score of 5. Plan for admission for ACS rule out. Patient is in agreement with plan for admission.  I consulted with Dr. Olevia Bowens who accepted the patient for admission.  Will provide with NTG and ASA.    This patient was discussed with Dr. Billy Fischer who agrees with assessment and plan.   Final Clinical Impressions(s) / ED Diagnoses   Final diagnoses:  Chest pain, unspecified type  Palpitations    New Prescriptions New Prescriptions   No medications on file     Waynetta Pean, PA-C 07/22/16 2012    Gareth Morgan, MD 07/24/16 [phone]

## 2016-07-22 NOTE — H&P (Signed)
History and Physical    Dylan [name] [id number] DOB: 11/09/1965 DOA: 07/22/2016  PCP: [name], PA   Patient coming from: Home.  Chief Complaint: Chest pain.  HPI: Dylan [name] is a 50 y.o. male with medical history significant of CAD, coronary stent thrombosis, PACs, essential hypertension, hyperlipidemia, mesenteric vein thrombosis, migraine headaches who is coming to the emergency department due to chest pain.   Per patient, he has been having chest pain since about 11:30 today while he was having lunch at work. Per patient, the chest pain is intermittent. He describes the pain as pressure-like, radiates to his back, with a maximum intensity was 7 out of 10, currently at 5 out of 10, associated with dyspnea, palpitations, 1 episode of diaphoresis around 1500, worsened by exertion sometimes, not relieved by sublingual NTG.   ED Course: The patient received nitroglycerin, aspirin, hydromorphone and Zofran in the emergency department experiencing relief. EKG was low voltage without changes, first troponin level was normal. Normal CBC and BMP, except for glucose level of 167 mg/dL.   Review of Systems: As per HPI otherwise 10 point review of systems negative.    Past Medical History:  Diagnosis Date  . CAD S/P percutaneous coronary angioplasty 07/2010; 07/2012; 08/2014   1) 2/'11: MI - 100% RCA - PCI (3 Taxus ION DES 3.0 x 28, 3.0 x 24 & 3.5 x 12 distal-prox); b) ISR of RCA stent -AnngioSculpt PTCA; c) NSTEMI 1/'16: mild ISR with Thrombosis of RCA Stents (in setting of Mesenteric Vein Thrombosis) - Cutting Balloon PTCA; c. Re-look Cath 07/2015: ~15% ISR in RCA,20% p-mLAD & o-pCx.  . Coronary stent thrombosis 08/15/2014  . Essential hypertension 07/07/2012  . Finding of multiple premature atrial contractions by electrocardiography    Symptomatic palpitations ; worse with poor sleep and fatigue along with dehydration.   . Hyperlipidemia with target LDL less than 70  09/01/2014  . Mesenteric vein thrombosis   . Migraines    "maybe one/yr" (07/06/2012)  . NSTEMI (non-ST elevated myocardial infarction) ([place]) 07/2010, 08/2014   And unstable angina in February 20 13th; Echo 1/12/'16: Moderate concentric LVH. EF 60-65% with no regional WMA. Gr 1 DD, otherwise normal  . Stroke ([place]) 2011; 2013   S/P cardiac cath - embolic stroke; denies residual (07/06/2012)  . 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 04/19/2013   No evidence of CVA on MRI/MRA & Dliated Eye Examination. ruptured blood vessel & not occlusion.    Past Surgical History:  Procedure Laterality Date  . BOWEL RESECTION N/A 08/28/2014   Procedure: SMALL BOWEL RESECTION;  Surgeon: [name], MD;  Location: [facility];  Service: General;  Laterality: N/A;  . CARDIAC CATHETERIZATION  04/18/2013  . CARDIAC CATHETERIZATION N/A 07/20/2015   Procedure: Left Heart Cath and Coronary Angiography;  Surgeon: [name], MD;  Location: [facility];  Service: Cardiovascular;  15% ISR in the RCA stent segment, 20% proximal-mid LAD as well as ostial and proximal circumflex. Normal LV function.  . CORONARY ANGIOPLASTY  07/06/2012; 08/15/2014   a) 12/'13: PTCA of 80% ISR mRCA - AngioSculpt; b) PTCA of  ISR/thrombosis  . CORONARY ANGIOPLASTY WITH STENT PLACEMENT  07/2010   inferior wall MI - 3 Taxus Ion DES (3.0x41mm, 3.0x29mm, 3.5x17mm) to prox and mid RCA  . LAPAROSCOPIC APPENDECTOMY N/A 08/28/2014   Procedure: APPENDECTOMY LAPAROSCOPIC;  Surgeon: [name], MD;  Location:  [facility];  Service: General;  Laterality: N/A;  . LAPAROSCOPY  08/28/2014   Procedure: LAPAROSCOPY DIAGNOSTIC;  Surgeon: [name], MD;  Location: [facility];  Service: General;;  . LEFT HEART CATHETERIZATION WITH CORONARY ANGIOGRAM N/A 06/11/2012   Procedure: LEFT HEART CATHETERIZATION WITH CORONARY ANGIOGRAM;  Surgeon: [name], MD;   Location: [facility];  Service: Cardiovascular;  Laterality: N/A;  . LEFT HEART CATHETERIZATION WITH CORONARY ANGIOGRAM N/A 04/18/2013   Procedure: LEFT HEART CATHETERIZATION WITH CORONARY ANGIOGRAM;  Surgeon: [name], MD;  Location: [facility];  Service: Cardiovascular;  Laterality: N/A;  . LEFT HEART CATHETERIZATION WITH CORONARY ANGIOGRAM N/A 08/15/2014   Procedure: LEFT HEART CATHETERIZATION WITH CORONARY ANGIOGRAM;  Surgeon: [name], MD;  Location: [facility] CATH LAB;  Service: Cardiovascular;  Laterality: N/A;  . PERCUTANEOUS CORONARY STENT INTERVENTION (PCI-S) N/A 07/06/2012   Procedure: PERCUTANEOUS CORONARY STENT INTERVENTION (PCI-S);  Surgeon: [name], MD;  Location: [facility] C[facility] LAB;  Service: Cardiovascular;  Laterality: N/A;  . SEPTOPLASTY  2001  . TRANSTHORACIC ECHOCARDIOGRAM  08/15/2014   Moderate concentric LVH. EF 60-65% with no regional WMA. Gr 1 DD, otherwise normal     reports that he quit smoking about 6 years ago. His smoking use included Cigarettes. He has a 15.00 pack-year smoking history. He quit smokeless tobacco use about 6 years ago. His smokeless tobacco use included Chew. He reports that he drinks about 0.6 oz of alcohol per week . He reports that he does not use drugs.  Allergies  Allergen Reactions  . Crestor [Rosuvastatin] Other (See Comments)  . Zofran [Ondansetron Hcl] Nausea And Vomiting  . Ace Inhibitors Cough  . Lipitor [Atorvastatin] Other (See Comments)    myalgia  . Codeine Itching    Family History  Problem Relation Age of Onset  . Atrial fibrillation Mother   . Mitral valve prolapse Mother   . Coronary artery disease Father     CABG, aortic aneursym,  . Heart attack Father   . Heart attack Paternal Uncle   . Heart attack Paternal Grandfather     Prior to Admission medications   Medication Sig Start Date End Date Taking? Authorizing Provider  BENICAR 20 MG tablet TAKE 1 TABLET(20 MG) BY MOUTH DAILY 11/12/15  Yes Sherran Needs,  NP  clopidogrel (PLAVIX) 75 MG tablet TAKE 1 TABLET(75 MG) BY MOUTH DAILY WITH BREAKFAST 10/22/15  Yes [name], MD  Evolocumab 140 MG/ML SOAJ Inject 140 mg into the skin every 14 (fourteen) days. 06/11/16  Yes [name], MD  furosemide (LASIX) 40 MG tablet Take 1 tablet (40 mg total) by mouth 2 (two) times daily. 10/03/15  Yes [name], MD  ibuprofen (ADVIL,MOTRIN) 200 MG tablet Take 400 mg by mouth every 6 (six) hours as needed.   Yes Historical Provider, MD  metoprolol succinate (TOPROL-XL) 50 MG 24 hr tablet TAKE 1 TABLET BY MOUTH TWICE DAILY. TAKE WITH OR IMMEDIATELY FOLLOWING A MEAL. 05/07/16  Yes [name], MD  metoprolol tartrate (LOPRESSOR) 25 MG tablet Take 1 tablet (25 mg total) by mouth every 4 (four) hours as needed. 05/29/16 08/27/16 Yes [name], MD  nitroGLYCERIN (NITROSTAT) 0.4 MG SL tablet Place 1 tablet (0.4 mg total) under the tongue every 5 (five) minutes x 3 doses as needed for chest pain. 10/03/15  Yes [name], MD    Physical Exam:  Constitutional: NAD, calm, comfortable Vitals:   07/22/16 1854 07/22/16 1900 07/22/16 1930 07/22/16  2000  BP: (!) 157/102 122/73 140/80 131/77  Pulse: 67 70 61   Resp: 14 14 10 16   Temp:      TempSrc:      SpO2: 100% 100% 100% 99%   Eyes: PERRL, lids and conjunctivae normal ENMT: Mucous membranes are moist. Posterior pharynx clear of any exudate or lesions. Neck: normal, supple, no masses, no thyromegaly Respiratory: clear to auscultation bilaterally, no wheezing, no crackles. Normal respiratory effort. No accessory muscle use.  Cardiovascular: Regular rate and rhythm, Occasional extra systoles, no murmurs / rubs / gallops. 1+ lower extremity edema. 2+ pedal pulses. No carotid bruits.  Abdomen: Obese, soft, no tenderness, no masses palpated. No hepatosplenomegaly. Bowel sounds positive.  Musculoskeletal: no clubbing / cyanosis. No joint deformity upper and lower extremities. Good ROM, no contractures.  Normal muscle tone.  Skin: no rashes, lesions, ulcers. No induration Neurologic: CN 2-12 grossly intact. Sensation intact, DTR normal. Strength 5/5 in all 4.  Psychiatric: Normal judgment and insight. Alert and oriented x 4. Normal mood.    Labs on Admission: I have personally reviewed following labs and imaging studies  CBC:  Recent Labs Lab 07/22/16 1558  WBC 6.5  HGB 14.6  HCT 42.0  MCV 85.7  PLT 123456   Basic Metabolic Panel:  Recent Labs Lab 07/22/16 1558  NA 140  K 4.0  CL 106  CO2 27  GLUCOSE 167*  BUN 16  CREATININE 1.00  CALCIUM 9.9   GFR: CrCl cannot be calculated (Unknown ideal weight.). Liver Function Tests: No results for input(s): AST, ALT, ALKPHOS, BILITOT, PROT, ALBUMIN in the last 168 hours. No results for input(s): LIPASE, AMYLASE in the last 168 hours. No results for input(s): AMMONIA in the last 168 hours. Coagulation Profile: No results for input(s): INR, PROTIME in the last 168 hours. Cardiac Enzymes: No results for input(s): CKTOTAL, CKMB, CKMBINDEX, TROPONINI in the last 168 hours. BNP (last 3 results) No results for input(s): PROBNP in the last 8760 hours. HbA1C: No results for input(s): HGBA1C in the last 72 hours. CBG: No results for input(s): GLUCAP in the last 168 hours. Lipid Profile: No results for input(s): CHOL, HDL, LDLCALC, TRIG, CHOLHDL, LDLDIRECT in the last 72 hours. Thyroid Function Tests: No results for input(s): TSH, T4TOTAL, FREET4, T3FREE, THYROIDAB in the last 72 hours. Anemia Panel: No results for input(s): VITAMINB12, FOLATE, FERRITIN, TIBC, IRON, RETICCTPCT in the last 72 hours. Urine analysis:    Component Value Date/Time   COLORURINE AMBER (A) 08/26/2014 1455   APPEARANCEUR CLEAR 08/26/2014 1455   LABSPEC 1.014 08/26/2014 1455   PHURINE 5.0 08/26/2014 1455   GLUCOSEU NEGATIVE 08/26/2014 1455   HGBUR NEGATIVE 08/26/2014 1455   BILIRUBINUR NEGATIVE 08/26/2014 1455   KETONESUR NEGATIVE 08/26/2014 1455    PROTEINUR NEGATIVE 08/26/2014 1455   UROBILINOGEN 0.2 08/26/2014 1455   NITRITE NEGATIVE 08/26/2014 1455   LEUKOCYTESUR NEGATIVE 08/26/2014 1455    Radiological Exams on Admission: Dg Chest 2 View  Result Date: 07/22/2016 CLINICAL DATA:  Recent onset of left-sided sharp and pressure-like upper chest discomfort with nausea and shortness of breath. History of previous MI, coronary artery disease with stent placement, former smoker. EXAM: CHEST  2 VIEW COMPARISON:  Chest x-ray of May 26, 2016 FINDINGS: The lungs are adequately inflated. There is no focal infiltrate. There is no pleural effusion. The heart is normal in size. A coronary artery stent is visible. The pulmonary vascularity is normal. The mediastinum is normal in width. The bony thorax exhibits no acute  abnormality. There are metallic coils present in the right upper quadrant from a previous embolization procedure. IMPRESSION: There is no active cardiopulmonary disease. Electronically Signed   By: Deyjah Kindel  Martinique M.D.   On: 07/22/2016 16:43   08/15/2014 2-D echocardiogram with contrast ------------------------------------------------------------------- Indications:   Chest pain 786.51.  ------------------------------------------------------------------- History:  PMH: Elevated troponin. Coronary artery disease. Stroke. Risk factors: Current tobacco use. Hypertension.  ------------------------------------------------------------------- Study Conclusions  - Left ventricle: The cavity size was normal. There was moderate concentric hypertrophy. Systolic function was normal. The estimated ejection fraction was in the range of 60% to 65%. Wall motion was normal; there were no regional wall motion abnormalities. Doppler parameters are consistent with abnormal left ventricular relaxation (grade 1 diastolic dysfunction). There was no evidence of elevated ventricular filling pressure by Doppler parameters. -  Aortic valve: Structurally normal valve. There was no regurgitation. - Aorta: The aorta was normal, not dilated, and non-diseased. - Ascending aorta: The ascending aorta was normal in size. - Left atrium: The atrium was normal in size. - Right ventricle: Systolic function was normal. - Right atrium: The atrium was normal in size. - Tricuspid valve: There was trivial regurgitation. - Pulmonic valve: There was mild regurgitation. - Pericardium, extracardiac: The pericardium was normal in appearance.  07/20/2015 left heart cath and coronary angiography  Left Heart Cath and Coronary Angiography  Conclusion    Prox RCA to Dist RCA lesion, 15% stenosed. The lesion was previously treated with a drug-eluting stent greater than two years ago.  Prox LAD to Mid LAD lesion, 20% stenosed.  Ost Cx to Prox Cx lesion, 20% stenosed.  The left ventricular systolic function is normal.   1. Nonobstructive CAD 2. Normal LV function.  Plan: continue medical therapy. Consider noncardiac causes of chest pain.    06/03/2016 Exercise tolerance test    Upsloping ST segment depression ST segment depression of 1 mm was noted during stress in the II, III and aVF leads. Nondiagnostic  Exercise time 6 minutes and 51 seconds-fair with appropriate blood pressure response  Rare PACs/PVCs noted during exercise. No adverse arrhythmias  Overall low risk exercise treadmill test with no electrocardiographic evidence of ischemia.   Candee Furbish, MD   EKG: Independently reviewed Vent. rate 82 BPM PR interval 172 ms QRS duration 76 ms QT/QTc 358/418 ms P-R-T axes 28 49 52 Normal sinus rhythm Low voltage QRS Borderline ECG No significant change From previous EKG  Assessment/Plan Principal Problem:   Chest pain Admit to telemetry/observation. Trend troponin level. Continue Plavix 75 mg by mouth daily. Continue nitroglycerin as needed. Hydromorphone and Zofran if no relief with  NTG. Continue metoprolol. Consider cardiology evaluation if pain persists  Active Problems:   CAD S/P PCI with 3 DES stents to RCA   and PTCA x2 for ISR and thrombosis  Continue metoprolol and clopidogrel.    Diastolic dysfunction Continue furosemide 40 mg by mouth twice a day.    Dyslipidemia, statin intolerant. Heart healthy diet and other lifestyle modifications.    Essential hypertension Continue long-acting metoprolol 50 mg by mouth daily. Continue furosemide Monitor blood pressure.    Hyperglycemia Check fasting glucose level and hemoglobin A1c.    DVT prophylaxis: Lovenox SQ. Code Status: Full code. Family Communication:  Disposition Plan: Overnight cardiac monitoring and troponin level trending. Consults called:  Admission status: Observation/telemetry.    Reubin Milan MD [facility] Pager [phone].  If 7PM-7AM, please contact night-coverage [email or web address] Password [facility]  07/22/2016, 9:53 PM

## 2016-07-22 NOTE — Telephone Encounter (Signed)
Left message to call back  

## 2016-07-22 NOTE — Telephone Encounter (Signed)
Mr. Lepera is calling because he has been having PAC's every other beat for the pass two weeks . Please call   Thanks

## 2016-07-22 NOTE — Telephone Encounter (Signed)
follow up   Pt returning rn call

## 2016-07-22 NOTE — ED Notes (Signed)
Pt c/o chest pain 4/10 and headache. Refused additional dose of nitro.

## 2016-07-22 NOTE — Telephone Encounter (Signed)
Spoke with patient and he stated he was in route to the hospital He has been having chest pains and tightness so he decided to go to ED  He was driving himself and was about a mile from [place] and advised patient on his way

## 2016-07-22 NOTE — ED Triage Notes (Signed)
Pt here for left sided CP and nausea; pt with hx of stents and MI

## 2016-07-22 NOTE — Telephone Encounter (Signed)
Spoke with patient. He reports he is having PACs every other beat. This has been going on consistently for the past 2 weeks. He is using lopressor prn regularly q4h and it is not helping. Around 1am, he was up with PACs, chest pain, HR 105-110. He reports his usual HR is 75bpm resting and around 85bpm during the day. He states he sometimes feels like he will pass out. He would like Dr. Allison Quarry advice on how to proceed with this issue. He states there was discussion on med change at some point if beta-blokcer was ineffective.   Will route to Dr. Sharlyn Bologna, RN

## 2016-07-22 NOTE — Telephone Encounter (Signed)
Let's see what happens in the emergency room. I think we will have to adjust medications. Potentially using calcium channel blocker.   Glenetta Hew c

## 2016-07-23 ENCOUNTER — Encounter (HOSPITAL_COMMUNITY)
Admission: EM | Disposition: A | Discharge status: Home / Self Care | Payer: Self-pay | Source: Home / Self Care | Attending: Emergency Medicine

## 2016-07-23 ENCOUNTER — Encounter (HOSPITAL_COMMUNITY): Payer: Self-pay

## 2016-07-23 DIAGNOSIS — E785 Hyperlipidemia, unspecified: Secondary | ICD-10-CM

## 2016-07-23 DIAGNOSIS — Z955 Presence of coronary angioplasty implant and graft: Secondary | ICD-10-CM | POA: Diagnosis not present

## 2016-07-23 DIAGNOSIS — I200 Unstable angina: Secondary | ICD-10-CM

## 2016-07-23 DIAGNOSIS — I10 Essential (primary) hypertension: Secondary | ICD-10-CM

## 2016-07-23 DIAGNOSIS — I2510 Atherosclerotic heart disease of native coronary artery without angina pectoris: Secondary | ICD-10-CM

## 2016-07-23 DIAGNOSIS — I201 Angina pectoris with documented spasm: Secondary | ICD-10-CM | POA: Diagnosis not present

## 2016-07-23 DIAGNOSIS — Z9861 Coronary angioplasty status: Secondary | ICD-10-CM

## 2016-07-23 DIAGNOSIS — R079 Chest pain, unspecified: Secondary | ICD-10-CM | POA: Diagnosis not present

## 2016-07-23 HISTORY — PX: CARDIAC CATHETERIZATION: SHX172

## 2016-07-23 LAB — PROTIME-INR
INR: 1.1
PROTHROMBIN TIME: 14.2 s (ref 11.4–15.2)

## 2016-07-23 LAB — TROPONIN I: Troponin I: <0.03 ng/mL (ref <0.03)

## 2016-07-23 LAB — GLUCOSE, CAPILLARY: GLUCOSE-CAPILLARY: 169 mg/dL — AB (ref 65–99)

## 2016-07-23 LAB — PLATELET INHIBITION P2Y12: PLATELET FUNCTION P2Y12: 161 [PRU] — AB (ref 194–418)

## 2016-07-23 SURGERY — LEFT HEART CATH AND CORONARY ANGIOGRAPHY

## 2016-07-23 MED ORDER — SODIUM CHLORIDE 0.9 % IV SOLN: 250.0000 mL | INTRAVENOUS | Status: DC | PRN

## 2016-07-23 MED ORDER — HEPARIN SODIUM (PORCINE) 1000 UNIT/ML IJ SOLN
INTRAMUSCULAR | Status: AC
  Filled 2016-07-23: qty 1

## 2016-07-23 MED ORDER — HEPARIN (PORCINE) IN NACL 2-0.9 UNIT/ML-% IJ SOLN
INTRAMUSCULAR | Status: DC | PRN
  Administered 2016-07-23: 1500 mL

## 2016-07-23 MED ORDER — MIDAZOLAM HCL 2 MG/2ML IJ SOLN
INTRAMUSCULAR | Status: AC
  Filled 2016-07-23: qty 2

## 2016-07-23 MED ORDER — SODIUM CHLORIDE 0.9% FLUSH: 3.0000 mL | Freq: Two times a day (BID) | INTRAVENOUS | Status: DC

## 2016-07-23 MED ORDER — LIDOCAINE HCL (PF) 1 % IJ SOLN
INTRAMUSCULAR | Status: AC
  Filled 2016-07-23: qty 30

## 2016-07-23 MED ORDER — HEPARIN SODIUM (PORCINE) 1000 UNIT/ML IJ SOLN
INTRAMUSCULAR | Status: DC | PRN
  Administered 2016-07-23: 6000 [IU] via INTRAVENOUS

## 2016-07-23 MED ORDER — HEPARIN BOLUS VIA INFUSION
4000.0000 [IU] | Freq: Once | INTRAVENOUS | Status: AC
  Administered 2016-07-23: 4000 [IU] via INTRAVENOUS
  Filled 2016-07-23: qty 4000

## 2016-07-23 MED ORDER — MIDAZOLAM HCL 2 MG/2ML IJ SOLN
INTRAMUSCULAR | Status: DC | PRN
  Administered 2016-07-23: 2 mg via INTRAVENOUS

## 2016-07-23 MED ORDER — HEPARIN (PORCINE) IN NACL 100-0.45 UNIT/ML-% IJ SOLN
1600.0000 [IU]/h | INTRAMUSCULAR | Status: DC
  Administered 2016-07-23: 1600 [IU]/h via INTRAVENOUS
  Filled 2016-07-23: qty 250

## 2016-07-23 MED ORDER — SODIUM CHLORIDE 0.9% FLUSH: 3.0000 mL | INTRAVENOUS | Status: DC | PRN

## 2016-07-23 MED ORDER — SODIUM CHLORIDE 0.9 % WEIGHT BASED INFUSION: 1.0000 mL/kg/h | INTRAVENOUS | Status: DC

## 2016-07-23 MED ORDER — IOPAMIDOL (ISOVUE-370) INJECTION 76%
INTRAVENOUS | Status: DC | PRN
  Administered 2016-07-23: 70 mL via INTRA_ARTERIAL

## 2016-07-23 MED ORDER — ASPIRIN 81 MG PO CHEW
81.0000 mg | CHEWABLE_TABLET | ORAL | Status: AC
  Administered 2016-07-23: 81 mg via ORAL
  Filled 2016-07-23: qty 1

## 2016-07-23 MED ORDER — SODIUM CHLORIDE 0.9 % WEIGHT BASED INFUSION
3.0000 mL/kg/h | INTRAVENOUS | Status: DC
  Administered 2016-07-23: 3 mL/kg/h via INTRAVENOUS

## 2016-07-23 MED ORDER — MORPHINE SULFATE (PF) 2 MG/ML IV SOLN
2.0000 mg | INTRAVENOUS | Status: DC | PRN
  Administered 2016-07-23 (×3): 2 mg via INTRAVENOUS
  Filled 2016-07-23 (×3): qty 1

## 2016-07-23 MED ORDER — VERAPAMIL HCL 2.5 MG/ML IV SOLN
INTRAVENOUS | Status: AC
  Filled 2016-07-23: qty 2

## 2016-07-23 MED ORDER — SODIUM CHLORIDE 0.9 % WEIGHT BASED INFUSION: 3.0000 mL/kg/h | INTRAVENOUS | Status: DC

## 2016-07-23 MED ORDER — METOPROLOL SUCCINATE ER 50 MG PO TB24
50.0000 mg | ORAL_TABLET | Freq: Two times a day (BID) | ORAL | Status: DC
  Administered 2016-07-23 – 2016-07-24 (×2): 50 mg via ORAL
  Filled 2016-07-23 (×2): qty 1

## 2016-07-23 MED ORDER — HEPARIN (PORCINE) IN NACL 2-0.9 UNIT/ML-% IJ SOLN
INTRAMUSCULAR | Status: AC
  Filled 2016-07-23: qty 1500

## 2016-07-23 MED ORDER — FENTANYL CITRATE (PF) 100 MCG/2ML IJ SOLN
INTRAMUSCULAR | Status: AC
  Filled 2016-07-23: qty 2

## 2016-07-23 MED ORDER — IOPAMIDOL (ISOVUE-370) INJECTION 76%
INTRAVENOUS | Status: AC
  Filled 2016-07-23: qty 100

## 2016-07-23 MED ORDER — VERAPAMIL HCL 2.5 MG/ML IV SOLN
INTRAVENOUS | Status: DC | PRN
  Administered 2016-07-23: 17:00:00 via INTRA_ARTERIAL

## 2016-07-23 MED ORDER — FENTANYL CITRATE (PF) 100 MCG/2ML IJ SOLN
INTRAMUSCULAR | Status: DC | PRN
  Administered 2016-07-23: 25 ug via INTRAVENOUS

## 2016-07-23 MED ORDER — LIDOCAINE HCL (PF) 1 % IJ SOLN
INTRAMUSCULAR | Status: DC | PRN
  Administered 2016-07-23: 2 mL

## 2016-07-23 MED ORDER — SODIUM CHLORIDE 0.9% FLUSH
3.0000 mL | Freq: Two times a day (BID) | INTRAVENOUS | Status: DC
Start: 2016-07-23 — End: 2016-07-23

## 2016-07-23 MED ORDER — MORPHINE SULFATE (PF) 2 MG/ML IV SOLN
2.0000 mg | INTRAVENOUS | Status: DC | PRN
Start: 2016-07-23 — End: 2016-07-23
  Administered 2016-07-23 (×2): 2 mg via INTRAVENOUS
  Filled 2016-07-23 (×2): qty 1

## 2016-07-23 MED ORDER — SODIUM CHLORIDE 0.9 % WEIGHT BASED INFUSION
1.0000 mL/kg/h | INTRAVENOUS | Status: AC
  Administered 2016-07-23: 1 mL/kg/h via INTRAVENOUS

## 2016-07-23 MED ORDER — ASPIRIN 81 MG PO CHEW: 81.0000 mg | CHEWABLE_TABLET | ORAL | Status: DC

## 2016-07-23 SURGICAL SUPPLY — 12 items
CATH INFINITI 5FR MULTPACK ANG (CATHETERS) ×2 IMPLANT
DEVICE RAD COMP TR BAND LRG (VASCULAR PRODUCTS) ×2 IMPLANT
GLIDESHEATH SLEND SS 6F .021 (SHEATH) ×2 IMPLANT
GUIDEWIRE INQWIRE 1.5J.035X260 (WIRE) ×1 IMPLANT
HOVERMATT SINGLE USE (MISCELLANEOUS) ×2 IMPLANT
INQWIRE 1.5J .035X260CM (WIRE) ×2
KIT HEART LEFT (KITS) ×2 IMPLANT
PACK CARDIAC CATHETERIZATION (CUSTOM PROCEDURE TRAY) ×2 IMPLANT
SYR MEDRAD MARK V 150ML (SYRINGE) ×2 IMPLANT
TRANSDUCER W/STOPCOCK (MISCELLANEOUS) ×2 IMPLANT
TUBING CIL FLEX 10 FLL-RA (TUBING) ×2 IMPLANT
WIRE HI TORQ VERSACORE-J 145CM (WIRE) ×2 IMPLANT

## 2016-07-23 NOTE — H&P (View-Only) (Signed)
\\\\\\\\\\\\\        Reason for Consult: chest pain  Referring Physician: Dr. Eliseo Squires   PCP:  [name], [place]  Primary Cardiologist:Dr. D. Hiroki Wint is an 50 y.o. male.    Chief Complaint: admitted with complaints of Lt chest pain and nausea.     HPI:  74 yom with hx of MSTEMI in 2011, with multiple stents placed to RCA.  2013 with [place] and [place] with PTCA with angiosculpt balloon, 2016 NSTEMI-- ISR RCA with PTCA and antithrombotic Rx.  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   07/2015 with angina, and stable cath.   Also with HLD on repatha.  Developed PACs and PVCs and underwent ETT with low risk, rare PACs noted no increase with exercise.   Now admitted with chest pain, with nausea. Started again yesterday with with lunch.  He did have diaphoresis.  Not relieved with NTG. Pain began about 2 weeks ago and would come and go.  Yesterday was more intense.  He has been using NTG though without much help.     Pain has continued to come and go during the night.  He has used MSO4.     He did have CVA  With initial procedure.    troponins neg. EKG stable, PACs.    Currently pain will come and go.     Past Medical History:  Diagnosis Date  . CAD S/P percutaneous coronary angioplasty 07/2010; 07/2012; 08/2014   1) 2/'11: MI - 100% RCA - PCI (3 Taxus ION DES 3.0 x 28, 3.0 x 24 & 3.5 x 12 distal-prox); b) ISR of RCA stent -AnngioSculpt PTCA; c) NSTEMI 1/'16: mild ISR with Thrombosis of RCA Stents (in setting of Mesenteric Vein Thrombosis) - Cutting Balloon PTCA; c. Re-look Cath 07/2015: ~15% ISR in RCA,20% p-mLAD & o-pCx.  . Coronary stent thrombosis 08/15/2014  . Essential hypertension 07/07/2012  . Finding of multiple premature atrial contractions by electrocardiography    Symptomatic palpitations ; worse with poor sleep and fatigue along with dehydration.   . Hyperlipidemia with  target LDL less than 70 09/01/2014  . Mesenteric vein thrombosis   . Migraines    "maybe one/yr" (07/06/2012)  . NSTEMI (non-ST elevated myocardial infarction) (Why) 07/2010, 08/2014   And unstable angina in February 20 13th; Echo 1/12/'16: Moderate concentric LVH. EF 60-65% with no regional WMA. Gr 1 DD, otherwise normal  . Stroke ([facility]) 2011; 2013   S/P cardiac cath - embolic stroke; denies residual (07/06/2012)  . 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 04/19/2013   No evidence of CVA on MRI/MRA & Dliated Eye Examination. ruptured blood vessel & not occlusion.    Past Surgical History:  Procedure Laterality Date  . BOWEL RESECTION N/A 08/28/2014   Procedure: SMALL BOWEL RESECTION;  Surgeon: [name], MD;  Location: [facility];  Service: General;  Laterality: N/A;  . CARDIAC CATHETERIZATION  04/18/2013  . CARDIAC CATHETERIZATION N/A 07/20/2015   Procedure: Left Heart Cath and Coronary Angiography;  Surgeon: Dempsey Ahonen M Martinique, MD;  Location: [facility];  Service: Cardiovascular;  15% ISR in the RCA stent segment, 20% proximal-mid LAD as well as ostial and proximal circumflex. Normal LV function.  . CORONARY ANGIOPLASTY  07/06/2012; 08/15/2014   a) 12/'13: PTCA of 80% ISR mRCA -  AngioSculpt; b) PTCA of  ISR/thrombosis  . CORONARY ANGIOPLASTY WITH STENT PLACEMENT  07/2010   inferior wall MI - 3 Taxus Ion DES (3.0x71m, 3.0x221m 3.5x1251mto prox and mid RCA  . LAPAROSCOPIC APPENDECTOMY N/A 08/28/2014   Procedure: APPENDECTOMY LAPAROSCOPIC;  Surgeon: DouCoralie KeensD;  [facility];  Laterality: N/A;  . LAPAROSCOPY  08/28/2014   Procedure: LAPAROSCOPY DIAGNOSTIC;  Surgeon: DouCoralie KeensD;  [facility];;  . LEFT HEART CATHETERIZATION WITH CORONARY ANGIOGRAM N/A 06/11/2012   Procedure: LEFT HEART CATHETERIZATION WITH CORONARY ANGIOGRAM;   Surgeon: MihSanda KleinD;  [facility];  Service: Cardiovascular;  Laterality: N/A;  . LEFT HEART CATHETERIZATION WITH CORONARY ANGIOGRAM N/A 04/18/2013   Procedure: LEFT HEART CATHETERIZATION WITH CORONARY ANGIOGRAM;  Surgeon: ThoTroy SineD;  [facility];  Service: Cardiovascular;  Laterality: N/A;  . LEFT HEART CATHETERIZATION WITH CORONARY ANGIOGRAM N/A 08/15/2014   Procedure: LEFT HEART CATHETERIZATION WITH CORONARY ANGIOGRAM;  Surgeon: DavLeonie ManD;  [facility];  Service: Cardiovascular;  Laterality: N/A;  . PERCUTANEOUS CORONARY STENT INTERVENTION (PCI-S) N/A 07/06/2012   Procedure: PERCUTANEOUS CORONARY STENT INTERVENTION (PCI-S);  Surgeon: JonLorretta HarpD;  [facility];  Service: Cardiovascular;  Laterality: N/A;  . SEPTOPLASTY  2001  . TRANSTHORACIC ECHOCARDIOGRAM  08/15/2014   Moderate concentric LVH. EF 60-65% with no regional WMA. Gr 1 DD, otherwise normal    Family History  Problem Relation Age of Onset  . Atrial fibrillation Mother   . Mitral valve prolapse Mother   . Coronary artery disease Father     CABG, aortic aneursym,  . Heart attack Father   . Heart attack Paternal Uncle   . Heart attack Paternal Grandfather    Social History:  reports that he quit smoking about 6 years ago. His smoking use included Cigarettes. He has a 15.00 pack-year smoking history. He quit smokeless tobacco use about 6 years ago. His smokeless tobacco use included Chew. He reports that he drinks about 0.6 oz of alcohol per week . He reports that he does not use drugs.  Allergies:  Allergies  Allergen Reactions  . Crestor [Rosuvastatin] Other (See Comments)  . Zofran [Ondansetron Hcl] Nausea And Vomiting  . Ace Inhibitors Cough  . Lipitor [Atorvastatin] Other (See Comments)    myalgia  . Codeine Itching     OUTPATIENT MEDICATIONS: No current facility-administered medications on file prior to encounter.    Current Outpatient  Prescriptions on File Prior to Encounter  Medication Sig Dispense Refill  . BENICAR 20 MG tablet TAKE 1 TABLET(20 MG) BY MOUTH DAILY 30 tablet 6  . clopidogrel (PLAVIX) 75 MG tablet TAKE 1 TABLET(75 MG) BY MOUTH DAILY WITH BREAKFAST 90 tablet 3  . Evolocumab 140 MG/ML SOAJ Inject 140 mg into the skin every 14 (fourteen) days. 2 pen 11  . furosemide (LASIX) 40 MG tablet Take 1 tablet (40 mg total) by mouth 2 (two) times daily. 180 tablet 3  . metoprolol succinate (TOPROL-XL) 50 MG 24 hr tablet TAKE 1 TABLET BY MOUTH TWICE DAILY. TAKE WITH OR IMMEDIATELY FOLLOWING A MEAL. 180 tablet 1  . metoprolol tartrate (LOPRESSOR) 25 MG tablet Take 1 tablet (25 mg total) by mouth every 4 (four) hours as needed. 120 tablet 4  . nitroGLYCERIN (NITROSTAT) 0.4 MG SL tablet Place 1 tablet (0.4 mg total) under the tongue every 5 (five) minutes x 3 doses as needed for chest pain.  25 tablet 2   CURRENT MEDICATIONS: Scheduled Meds: . clopidogrel  75 mg Oral Daily  . enoxaparin (LOVENOX) injection  40 mg Subcutaneous Q24H  . furosemide  40 mg Oral BID  . irbesartan  150 mg Oral Daily  . metoprolol succinate  50 mg Oral Daily  . sodium chloride flush  3 mL Intravenous Q12H   Continuous Infusions: PRN Meds:.acetaminophen, ibuprofen, metoprolol tartrate, morphine injection, nitroGLYCERIN, ondansetron (ZOFRAN) IV   Results for orders placed or performed during the hospital encounter of 07/22/16 (from the past 48 hour(s))  Basic metabolic panel     Status: Abnormal   Collection Time: 07/22/16  3:58 PM  Result Value Ref Range   Sodium 140 135 - 145 mmol/L   Potassium 4.0 3.5 - 5.1 mmol/L   Chloride 106 101 - 111 mmol/L   CO2 27 22 - 32 mmol/L   Glucose, Bld 167 (H) 65 - 99 mg/dL   BUN 16 6 - 20 mg/dL   Creatinine, Ser 1.00 0.61 - 1.24 mg/dL   Calcium 9.9 8.9 - 10.3 mg/dL   GFR calc non Af Amer >60 >60 mL/min   GFR calc Af Amer >60 >60 mL/min    Comment: (NOTE) The eGFR has been calculated using the CKD EPI  equation. This calculation has not been validated in all clinical situations. eGFR's persistently <60 mL/min signify possible Chronic Kidney Disease.    Anion gap 7 5 - 15  CBC     Status: None   Collection Time: 07/22/16  3:58 PM  Result Value Ref Range   WBC 6.5 4.0 - 10.5 K/uL   RBC 4.90 4.22 - 5.81 MIL/uL   Hemoglobin 14.6 13.0 - 17.0 g/dL   HCT 42.0 39.0 - 52.0 %   MCV 85.7 78.0 - 100.0 fL   MCH 29.8 26.0 - 34.0 pg   MCHC 34.8 30.0 - 36.0 g/dL   RDW 12.9 11.5 - 15.5 %   Platelets 225 150 - 400 K/uL  I-stat troponin, ED     Status: None   Collection Time: 07/22/16  4:07 PM  Result Value Ref Range   Troponin i, poc 0.00 0.00 - 0.08 ng/mL   Comment 3            Comment: Due to the release kinetics of cTnI, a negative result within the first hours of the onset of symptoms does not rule out myocardial infarction with certainty. If myocardial infarction is still suspected, repeat the test at appropriate intervals.   Troponin I (q 6hr x 3)     Status: None   Collection Time: 07/22/16 10:16 PM  Result Value Ref Range   Troponin I <0.03 <0.03 ng/mL  Magnesium     Status: None   Collection Time: 07/22/16 10:16 PM  Result Value Ref Range   Magnesium 1.9 1.7 - 2.4 mg/dL  Troponin I (q 6hr x 3)     Status: None   Collection Time: 07/23/16  4:41 AM  Result Value Ref Range   Troponin I <0.03 <0.03 ng/mL   Dg Chest 2 View  Result Date: 07/22/2016 CLINICAL DATA:  Recent onset of left-sided sharp and pressure-like upper chest discomfort with nausea and shortness of breath. History of previous MI, coronary artery disease with stent placement, former smoker. EXAM: CHEST  2 VIEW COMPARISON:  Chest x-ray of May 26, 2016 FINDINGS: The lungs are adequately inflated. There is no focal infiltrate. There is no pleural effusion. The heart is normal in size. A  coronary artery stent is visible. The pulmonary vascularity is normal. The mediastinum is normal in width. The bony thorax exhibits  no acute abnormality. There are metallic coils present in the right upper quadrant from a previous embolization procedure. IMPRESSION: There is no active cardiopulmonary disease. Electronically Signed   By: David  Martinique M.D.   On: 07/22/2016 16:43    ROS: General:no colds or fevers, + weight increase Skin:no rashes or ulcers HEENT:no blurred vision, no congestion CV:see HPI PUL:see HPI GI:no diarrhea constipation or melena, no indigestion GU:no hematuria, no dysuria MS:no joint pain, no claudication Neuro:no syncope, + lightheadedness with the PACs Endo:no diabetes, no thyroid disease   Blood pressure 118/75, pulse (!) 59, temperature 97.8 F (36.6 C), temperature source Oral, resp. rate 16, height 6' 2"  (1.88 m), weight 293 lb 8 oz (133.1 kg), SpO2 96 %.  Wt Readings from Last 3 Encounters:  07/23/16 293 lb 8 oz (133.1 kg)  05/29/16 288 lb 6.4 oz (130.8 kg)  05/26/16 275 lb (124.7 kg)    PE: General:Pleasant affect, NAD Skin:Warm and dry, brisk capillary refill HEENT:normocephalic, sclera clear, mucus membranes moist Neck:supple, no JVD, no bruits  Heart:S1S2 RRR without murmur, gallup, rub or click Lungs:clear without rales, rhonchi, or wheezes KXF:GHWE, non tender, + BS, do not palpate liver spleen or masses Ext:no lower ext edema, 2+ pedal pulses, 2+ radial pulses Neuro:alert and oriented, MAE, follows commands, + facial symmetry    Assessment/Plan Principal Problem:   Chest pain Active Problems:   CAD S/P PCI with 3 DES stents to RCA and PTCA x2 for ISR and thrombosis    Dyslipidemia, statin intol   Essential hypertension   Hyperglycemia   Diastolic dysfunction   [place] with known CAD will proceed with cardiac cath today.  We checked with cath lab and it will be later in the day.  [place] for BK then NPO.  Will begin heparin for continued episodic chest pain.    CAD with multiple procedures  PCI  PACs with associated lightheadedness.  HLD on repatha  Dr. Johnsie Cancel has  seen Cecilie Kicks  Nurse Practitioner Certified [place] Pager [phone] or after 5pm or weekends call [phone] 07/23/2016, 7:50 AM    Patient examined chart reviewed. Recurrent SSCP in setting of known CAD and multiple interventions to RCA Recent ETT low risk but recurrent pain. Favor diagnostic cath today Has right radial loop Dr Martinique has done Him from left radial and will try to schedule with him today. Lab called orders written Start heparin. Exam otherwise Remarkable for good left radial pusle and obesity   Jenkins Rouge

## 2016-07-23 NOTE — Consult Note (Signed)
\\\\\\\\\\\\\        Reason for Consult: chest pain  Referring Physician: Dr. Eliseo Squires   PCP:  [name], [place]  Primary Cardiologist:Dr. D. Marice Angelino is an 50 y.o. male.    Chief Complaint: admitted with complaints of Lt chest pain and nausea.     HPI:  8 yom with hx of MSTEMI in 2011, with multiple stents placed to RCA.  2013 with [place] and [place] with PTCA with angiosculpt balloon, 2016 NSTEMI-- ISR RCA with PTCA and antithrombotic Rx.  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   07/2015 with angina, and stable cath.   Also with HLD on repatha.  Developed PACs and PVCs and underwent ETT with low risk, rare PACs noted no increase with exercise.   Now admitted with chest pain, with nausea. Started again yesterday with with lunch.  He did have diaphoresis.  Not relieved with NTG. Pain began about 2 weeks ago and would come and go.  Yesterday was more intense.  He has been using NTG though without much help.     Pain has continued to come and go during the night.  He has used MSO4.     He did have CVA  With initial procedure.    troponins neg. EKG stable, PACs.    Currently pain will come and go.     Past Medical History:  Diagnosis Date  . CAD S/P percutaneous coronary angioplasty 07/2010; 07/2012; 08/2014   1) 2/'11: MI - 100% RCA - PCI (3 Taxus ION DES 3.0 x 28, 3.0 x 24 & 3.5 x 12 distal-prox); b) ISR of RCA stent -AnngioSculpt PTCA; c) NSTEMI 1/'16: mild ISR with Thrombosis of RCA Stents (in setting of Mesenteric Vein Thrombosis) - Cutting Balloon PTCA; c. Re-look Cath 07/2015: ~15% ISR in RCA,20% p-mLAD & o-pCx.  . Coronary stent thrombosis 08/15/2014  . Essential hypertension 07/07/2012  . Finding of multiple premature atrial contractions by electrocardiography    Symptomatic palpitations ; worse with poor sleep and fatigue along with dehydration.   . Hyperlipidemia with  target LDL less than 70 09/01/2014  . Mesenteric vein thrombosis   . Migraines    "maybe one/yr" (07/06/2012)  . NSTEMI (non-ST elevated myocardial infarction) ([place]) 07/2010, 08/2014   And unstable angina in February 20 13th; Echo 1/12/'16: Moderate concentric LVH. EF 60-65% with no regional WMA. Gr 1 DD, otherwise normal  . Stroke ([place]) 2011; 2013   S/P cardiac cath - embolic stroke; denies residual (07/06/2012)  . 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 04/19/2013   No evidence of CVA on MRI/MRA & Dliated Eye Examination. ruptured blood vessel & not occlusion.    Past Surgical History:  Procedure Laterality Date  . BOWEL RESECTION N/A 08/28/2014   Procedure: SMALL BOWEL RESECTION;  Surgeon: [name], MD;  Location: [facility];  Service: General;  Laterality: N/A;  . CARDIAC CATHETERIZATION  04/18/2013  . CARDIAC CATHETERIZATION N/A 07/20/2015   Procedure: Left Heart Cath and Coronary Angiography;  Surgeon: Liat Mayol M Martinique, MD;  Location: [facility];  Service: Cardiovascular;  15% ISR in the RCA stent segment, 20% proximal-mid LAD as well as ostial and proximal circumflex. Normal LV function.  . CORONARY ANGIOPLASTY  07/06/2012; 08/15/2014   a) 12/'13: PTCA of 80% ISR mRCA -  AngioSculpt; b) PTCA of  ISR/thrombosis  . CORONARY ANGIOPLASTY WITH STENT PLACEMENT  07/2010   inferior wall MI - 3 Taxus Ion DES (3.0x31m, 3.0x24m 3.5x1270mto prox and mid RCA  . LAPAROSCOPIC APPENDECTOMY N/A 08/28/2014   Procedure: APPENDECTOMY LAPAROSCOPIC;  Surgeon: DouCoralie KeensD;  [facility];  Laterality: N/A;  . LAPAROSCOPY  08/28/2014   Procedure: LAPAROSCOPY DIAGNOSTIC;  Surgeon: DouCoralie KeensD;  [facility];;  . LEFT HEART CATHETERIZATION WITH CORONARY ANGIOGRAM N/A 06/11/2012   Procedure: LEFT HEART CATHETERIZATION WITH CORONARY ANGIOGRAM;   Surgeon: MihSanda KleinD;  [facility];  Service: Cardiovascular;  Laterality: N/A;  . LEFT HEART CATHETERIZATION WITH CORONARY ANGIOGRAM N/A 04/18/2013   Procedure: LEFT HEART CATHETERIZATION WITH CORONARY ANGIOGRAM;  Surgeon: ThoTroy SineD;  [facility];  Service: Cardiovascular;  Laterality: N/A;  . LEFT HEART CATHETERIZATION WITH CORONARY ANGIOGRAM N/A 08/15/2014   Procedure: LEFT HEART CATHETERIZATION WITH CORONARY ANGIOGRAM;  Surgeon: DavLeonie ManD;  [facility];  Service: Cardiovascular;  Laterality: N/A;  . PERCUTANEOUS CORONARY STENT INTERVENTION (PCI-S) N/A 07/06/2012   Procedure: PERCUTANEOUS CORONARY STENT INTERVENTION (PCI-S);  Surgeon: JonLorretta HarpD;  [facility];  Service: Cardiovascular;  Laterality: N/A;  . SEPTOPLASTY  2001  . TRANSTHORACIC ECHOCARDIOGRAM  08/15/2014   Moderate concentric LVH. EF 60-65% with no regional WMA. Gr 1 DD, otherwise normal    Family History  Problem Relation Age of Onset  . Atrial fibrillation Mother   . Mitral valve prolapse Mother   . Coronary artery disease Father     CABG, aortic aneursym,  . Heart attack Father   . Heart attack Paternal Uncle   . Heart attack Paternal Grandfather    Social History:  reports that he quit smoking about 6 years ago. His smoking use included Cigarettes. He has a 15.00 pack-year smoking history. He quit smokeless tobacco use about 6 years ago. His smokeless tobacco use included Chew. He reports that he drinks about 0.6 oz of alcohol per week . He reports that he does not use drugs.  Allergies:  Allergies  Allergen Reactions  . Crestor [Rosuvastatin] Other (See Comments)  . Zofran [Ondansetron Hcl] Nausea And Vomiting  . Ace Inhibitors Cough  . Lipitor [Atorvastatin] Other (See Comments)    myalgia  . Codeine Itching     OUTPATIENT MEDICATIONS: No current facility-administered medications on file prior to encounter.    Current Outpatient  Prescriptions on File Prior to Encounter  Medication Sig Dispense Refill  . BENICAR 20 MG tablet TAKE 1 TABLET(20 MG) BY MOUTH DAILY 30 tablet 6  . clopidogrel (PLAVIX) 75 MG tablet TAKE 1 TABLET(75 MG) BY MOUTH DAILY WITH BREAKFAST 90 tablet 3  . Evolocumab 140 MG/ML SOAJ Inject 140 mg into the skin every 14 (fourteen) days. 2 pen 11  . furosemide (LASIX) 40 MG tablet Take 1 tablet (40 mg total) by mouth 2 (two) times daily. 180 tablet 3  . metoprolol succinate (TOPROL-XL) 50 MG 24 hr tablet TAKE 1 TABLET BY MOUTH TWICE DAILY. TAKE WITH OR IMMEDIATELY FOLLOWING A MEAL. 180 tablet 1  . metoprolol tartrate (LOPRESSOR) 25 MG tablet Take 1 tablet (25 mg total) by mouth every 4 (four) hours as needed. 120 tablet 4  . nitroGLYCERIN (NITROSTAT) 0.4 MG SL tablet Place 1 tablet (0.4 mg total) under the tongue every 5 (five) minutes x 3 doses as needed for chest pain.  25 tablet 2   CURRENT MEDICATIONS: Scheduled Meds: . clopidogrel  75 mg Oral Daily  . enoxaparin (LOVENOX) injection  40 mg Subcutaneous Q24H  . furosemide  40 mg Oral BID  . irbesartan  150 mg Oral Daily  . metoprolol succinate  50 mg Oral Daily  . sodium chloride flush  3 mL Intravenous Q12H   Continuous Infusions: PRN Meds:.acetaminophen, ibuprofen, metoprolol tartrate, morphine injection, nitroGLYCERIN, ondansetron (ZOFRAN) IV   Results for orders placed or performed during the hospital encounter of 07/22/16 (from the past 48 hour(s))  Basic metabolic panel     Status: Abnormal   Collection Time: 07/22/16  3:58 PM  Result Value Ref Range   Sodium 140 135 - 145 mmol/L   Potassium 4.0 3.5 - 5.1 mmol/L   Chloride 106 101 - 111 mmol/L   CO2 27 22 - 32 mmol/L   Glucose, Bld 167 (H) 65 - 99 mg/dL   BUN 16 6 - 20 mg/dL   Creatinine, Ser 1.00 0.61 - 1.24 mg/dL   Calcium 9.9 8.9 - 10.3 mg/dL   GFR calc non Af Amer >60 >60 mL/min   GFR calc Af Amer >60 >60 mL/min    Comment: (NOTE) The eGFR has been calculated using the CKD EPI  equation. This calculation has not been validated in all clinical situations. eGFR's persistently <60 mL/min signify possible Chronic Kidney Disease.    Anion gap 7 5 - 15  CBC     Status: None   Collection Time: 07/22/16  3:58 PM  Result Value Ref Range   WBC 6.5 4.0 - 10.5 K/uL   RBC 4.90 4.22 - 5.81 MIL/uL   Hemoglobin 14.6 13.0 - 17.0 g/dL   HCT 42.0 39.0 - 52.0 %   MCV 85.7 78.0 - 100.0 fL   MCH 29.8 26.0 - 34.0 pg   MCHC 34.8 30.0 - 36.0 g/dL   RDW 12.9 11.5 - 15.5 %   Platelets 225 150 - 400 K/uL  I-stat troponin, ED     Status: None   Collection Time: 07/22/16  4:07 PM  Result Value Ref Range   Troponin i, poc 0.00 0.00 - 0.08 ng/mL   Comment 3            Comment: Due to the release kinetics of cTnI, a negative result within the first hours of the onset of symptoms does not rule out myocardial infarction with certainty. If myocardial infarction is still suspected, repeat the test at appropriate intervals.   Troponin I (q 6hr x 3)     Status: None   Collection Time: 07/22/16 10:16 PM  Result Value Ref Range   Troponin I <0.03 <0.03 ng/mL  Magnesium     Status: None   Collection Time: 07/22/16 10:16 PM  Result Value Ref Range   Magnesium 1.9 1.7 - 2.4 mg/dL  Troponin I (q 6hr x 3)     Status: None   Collection Time: 07/23/16  4:41 AM  Result Value Ref Range   Troponin I <0.03 <0.03 ng/mL   Dg Chest 2 View  Result Date: 07/22/2016 CLINICAL DATA:  Recent onset of left-sided sharp and pressure-like upper chest discomfort with nausea and shortness of breath. History of previous MI, coronary artery disease with stent placement, former smoker. EXAM: CHEST  2 VIEW COMPARISON:  Chest x-ray of May 26, 2016 FINDINGS: The lungs are adequately inflated. There is no focal infiltrate. There is no pleural effusion. The heart is normal in size. A  coronary artery stent is visible. The pulmonary vascularity is normal. The mediastinum is normal in width. The bony thorax exhibits  no acute abnormality. There are metallic coils present in the right upper quadrant from a previous embolization procedure. IMPRESSION: There is no active cardiopulmonary disease. Electronically Signed   By: David  Martinique M.D.   On: 07/22/2016 16:43    ROS: General:no colds or fevers, + weight increase Skin:no rashes or ulcers HEENT:no blurred vision, no congestion CV:see HPI PUL:see HPI GI:no diarrhea constipation or melena, no indigestion GU:no hematuria, no dysuria MS:no joint pain, no claudication Neuro:no syncope, + lightheadedness with the PACs Endo:no diabetes, no thyroid disease   Blood pressure 118/75, pulse (!) 59, temperature 97.8 F (36.6 C), temperature source Oral, resp. rate 16, height 6' 2"  (1.88 m), weight 293 lb 8 oz (133.1 kg), SpO2 96 %.  Wt Readings from Last 3 Encounters:  07/23/16 293 lb 8 oz (133.1 kg)  05/29/16 288 lb 6.4 oz (130.8 kg)  05/26/16 275 lb (124.7 kg)    PE: General:Pleasant affect, NAD Skin:Warm and dry, brisk capillary refill HEENT:normocephalic, sclera clear, mucus membranes moist Neck:supple, no JVD, no bruits  Heart:S1S2 RRR without murmur, gallup, rub or click Lungs:clear without rales, rhonchi, or wheezes YTR:ZNBV, non tender, + BS, do not palpate liver spleen or masses Ext:no lower ext edema, 2+ pedal pulses, 2+ radial pulses Neuro:alert and oriented, MAE, follows commands, + facial symmetry    Assessment/Plan Principal Problem:   Chest pain Active Problems:   CAD S/P PCI with 3 DES stents to RCA and PTCA x2 for ISR and thrombosis    Dyslipidemia, statin intol   Essential hypertension   Hyperglycemia   Diastolic dysfunction   [place] with known CAD will proceed with cardiac cath today.  We checked with cath lab and it will be later in the day.  [place] for BK then NPO.  Will begin heparin for continued episodic chest pain.    CAD with multiple procedures  PCI  PACs with associated lightheadedness.  HLD on repatha  Dr. Johnsie Cancel has  seen Cecilie Kicks  Nurse Practitioner Certified [place] Pager [phone] or after 5pm or weekends call [phone] 07/23/2016, 7:50 AM    Patient examined chart reviewed. Recurrent SSCP in setting of known CAD and multiple interventions to RCA Recent ETT low risk but recurrent pain. Favor diagnostic cath today Has right radial loop Dr Martinique has done Him from left radial and will try to schedule with him today. Lab called orders written Start heparin. Exam otherwise Remarkable for good left radial pusle and obesity   Jenkins Rouge

## 2016-07-23 NOTE — Interval H&P Note (Signed)
History and Physical Interval Note:  07/23/2016 4:40 PM  Dylan [name]  has presented today for surgery, with the diagnosis of angina  The various methods of treatment have been discussed with the patient and family. After consideration of risks, benefits and other options for treatment, the patient has consented to  Procedure(s): Left Heart Cath and Coronary Angiography (N/A) as a surgical intervention .  The patient's history has been reviewed, patient examined, no change in status, stable for surgery.  I have reviewed the patient's chart and labs.  Questions were answered to the patient's satisfaction.   Cath Lab Visit (complete for each Cath Lab visit)  Clinical Evaluation Leading to the Procedure:   ACS: Yes.    Non-ACS:    Anginal Classification: CCS III  Anti-ischemic medical therapy: Minimal Therapy (1 class of medications)  Non-Invasive Test Results: No non-invasive testing performed  Prior CABG: No previous CABG       Amerah Puleo Martinique MD,FACC 07/23/2016 4:40 PM

## 2016-07-23 NOTE — Progress Notes (Signed)
PROGRESS NOTE    KAVEH BLOWERS  [id number] DOB: 01/04/1966 DOA: 07/22/2016 PCP: [name], PA   Outpatient Specialists:     Brief Narrative:   Dylan [name] is a 50 y.o. male with medical history significant of CAD, coronary stent thrombosis, PACs, essential hypertension, hyperlipidemia, mesenteric vein thrombosis, migraine headaches who is coming to the emergency department due to chest pain.   Per patient, he has been having chest pain since about 11:30 today while he was having lunch at work. Per patient, the chest pain is intermittent. He describes the pain as pressure-like, radiates to his back, with a maximum intensity was 7 out of 10, currently at 5 out of 10, associated with dyspnea, palpitations, 1 episode of diaphoresis around 1500, worsened by exertion sometimes, not relieved by sublingual NTG.    Assessment & Plan:   Principal Problem:   Chest pain Active Problems:   CAD S/P PCI with 3 DES stents to RCA and PTCA x2 for ISR and thrombosis    Dyslipidemia, statin intol   Essential hypertension   Hyperglycemia   Diastolic dysfunction   Chest pain Cardiology consult-- for cath Per Patient he has had 10 caths in the past    CAD S/P PCI with 3 DES stents to RCA   and PTCA x2 for ISR and thrombosis  Continue metoprolol and clopidogrel.    Diastolic dysfunction Continue furosemide 40 mg by mouth twice a day.    Dyslipidemia, statin intolerant. Heart healthy diet and other lifestyle modifications.    Essential hypertension Continue long-acting metoprolol 50 mg by mouth daily. Continue furosemide Monitor blood pressure.    Hyperglycemia Check fasting glucose level and hemoglobin A1c.    DVT prophylaxis:  SQ Heparin  Code Status: Full Code   Consultants:   cardiology     Subjective: Still with chest discomfort  Objective: Vitals:   07/22/16 2159 07/22/16 2206 07/23/16 0527 07/23/16 0844  BP: (!) 135/91  118/75 (!)  140/91  Pulse: (!) 54  (!) 59 70  Resp:      Temp: 97.7 F (36.5 C)  97.8 F (36.6 C)   TempSrc: Oral  Oral   SpO2: 96%  96%   Weight:  134 kg (295 lb 6.4 oz) 133.1 kg (293 lb 8 oz)   Height:  6\' 2"  (1.88 m)      Intake/Output Summary (Last 24 hours) at 07/23/16 1249 Last data filed at 07/23/16 1031  Gross per 24 hour  Intake              405 ml  Output                0 ml  Net              405 ml   Filed Weights   07/22/16 2206 07/23/16 0527  Weight: 134 kg (295 lb 6.4 oz) 133.1 kg (293 lb 8 oz)    Examination:  General exam: Appears calm and comfortable  Respiratory system: Clear to auscultation. Respiratory effort normal. Cardiovascular system: S1 & S2 heard, RRR. No JVD, murmurs, rubs, gallops or clicks. No pedal edema. Gastrointestinal system: Abdomen is nondistended, soft and nontender. No organomegaly or masses felt. Normal bowel sounds heard.    Data Reviewed: I have personally reviewed following labs and imaging studies  CBC:  Recent Labs Lab 07/22/16 1558  WBC 6.5  HGB 14.6  HCT 42.0  MCV 85.7  PLT 123456   Basic Metabolic Panel:  Recent  Labs Lab 07/22/16 1558 07/22/16 2216  NA 140  --   K 4.0  --   CL 106  --   CO2 27  --   GLUCOSE 167*  --   BUN 16  --   CREATININE 1.00  --   CALCIUM 9.9  --   MG  --  1.9   GFR: Estimated Creatinine Clearance: 126.8 mL/min (by C-G formula based on SCr of 1 mg/dL). Liver Function Tests: No results for input(s): AST, ALT, ALKPHOS, BILITOT, PROT, ALBUMIN in the last 168 hours. No results for input(s): LIPASE, AMYLASE in the last 168 hours. No results for input(s): AMMONIA in the last 168 hours. Coagulation Profile:  Recent Labs Lab 07/23/16 1009  INR 1.10   Cardiac Enzymes:  Recent Labs Lab 07/22/16 2216 07/23/16 0441  TROPONINI <0.03 <0.03   BNP (last 3 results) No results for input(s): PROBNP in the last 8760 hours. HbA1C: No results for input(s): HGBA1C in the last 72 hours. CBG: No  results for input(s): GLUCAP in the last 168 hours. Lipid Profile: No results for input(s): CHOL, HDL, LDLCALC, TRIG, CHOLHDL, LDLDIRECT in the last 72 hours. Thyroid Function Tests: No results for input(s): TSH, T4TOTAL, FREET4, T3FREE, THYROIDAB in the last 72 hours. Anemia Panel: No results for input(s): VITAMINB12, FOLATE, FERRITIN, TIBC, IRON, RETICCTPCT in the last 72 hours. Urine analysis:    Component Value Date/Time   COLORURINE AMBER (A) 08/26/2014 1455   APPEARANCEUR CLEAR 08/26/2014 1455   LABSPEC 1.014 08/26/2014 1455   PHURINE 5.0 08/26/2014 1455   GLUCOSEU NEGATIVE 08/26/2014 1455   HGBUR NEGATIVE 08/26/2014 1455   BILIRUBINUR NEGATIVE 08/26/2014 1455   KETONESUR NEGATIVE 08/26/2014 1455   PROTEINUR NEGATIVE 08/26/2014 1455   UROBILINOGEN 0.2 08/26/2014 1455   NITRITE NEGATIVE 08/26/2014 1455   LEUKOCYTESUR NEGATIVE 08/26/2014 1455     )No results found for this or any previous visit (from the past 240 hour(s)).    Anti-infectives    None       Radiology Studies: Dg Chest 2 View  Result Date: 07/22/2016 CLINICAL DATA:  Recent onset of left-sided sharp and pressure-like upper chest discomfort with nausea and shortness of breath. History of previous MI, coronary artery disease with stent placement, former smoker. EXAM: CHEST  2 VIEW COMPARISON:  Chest x-ray of May 26, 2016 FINDINGS: The lungs are adequately inflated. There is no focal infiltrate. There is no pleural effusion. The heart is normal in size. A coronary artery stent is visible. The pulmonary vascularity is normal. The mediastinum is normal in width. The bony thorax exhibits no acute abnormality. There are metallic coils present in the right upper quadrant from a previous embolization procedure. IMPRESSION: There is no active cardiopulmonary disease. Electronically Signed   By: David  Martinique M.D.   On: 07/22/2016 16:43        Scheduled Meds: . [START ON 07/24/2016] aspirin  81 mg Oral Pre-Cath   . clopidogrel  75 mg Oral Daily  . furosemide  40 mg Oral BID  . irbesartan  150 mg Oral Daily  . metoprolol succinate  50 mg Oral Daily  . sodium chloride flush  3 mL Intravenous Q12H  . sodium chloride flush  3 mL Intravenous Q12H   Continuous Infusions: . sodium chloride 1 mL/kg/hr (07/23/16 1138)  . heparin 1,600 Units/hr (07/23/16 1016)     LOS: 0 days    Time spent: 25 min    [place], DO [facility] Pager [phone]  If  7PM-7AM, please contact night-coverage [email or web address] Password [facility] 07/23/2016, 12:49 PM

## 2016-07-23 NOTE — Progress Notes (Signed)
ANTICOAGULATION CONSULT NOTE - Initial Consult  Pharmacy Consult for Heparin Indication: chest pain/ACS  Allergies  Allergen Reactions  . Crestor [Rosuvastatin] Other (See Comments)  . Zofran [Ondansetron Hcl] Nausea And Vomiting  . Ace Inhibitors Cough  . Lipitor [Atorvastatin] Other (See Comments)    myalgia  . Codeine Itching    Patient Measurements: Height: 6\' 2"  (188 cm) Weight: 293 lb 8 oz (133.1 kg) IBW/kg (Calculated) : 82.2 Heparin Dosing Weight: 113 kg  Vital Signs: Temp: 97.8 F (36.6 C) (12/20 0527) Temp Source: Oral (12/20 0527) BP: 140/91 (12/20 0844) Pulse Rate: 70 (12/20 0844)  Labs:  Recent Labs  07/22/16 1558 07/22/16 2216 07/23/16 0441  HGB 14.6  --   --   HCT 42.0  --   --   PLT 225  --   --   CREATININE 1.00  --   --   TROPONINI  --  <0.03 <0.03    Estimated Creatinine Clearance: 126.8 mL/min (by C-G formula based on SCr of 1 mg/dL).   Medical History: Past Medical History:  Diagnosis Date  . CAD S/P percutaneous coronary angioplasty 07/2010; 07/2012; 08/2014   1) 2/'11: MI - 100% RCA - PCI (3 Taxus ION DES 3.0 x 28, 3.0 x 24 & 3.5 x 12 distal-prox); b) ISR of RCA stent -AnngioSculpt PTCA; c) NSTEMI 1/'16: mild ISR with Thrombosis of RCA Stents (in setting of Mesenteric Vein Thrombosis) - Cutting Balloon PTCA; c. Re-look Cath 07/2015: ~15% ISR in RCA,20% p-mLAD & o-pCx.  . Coronary stent thrombosis 08/15/2014  . Essential hypertension 07/07/2012  . Finding of multiple premature atrial contractions by electrocardiography    Symptomatic palpitations ; worse with poor sleep and fatigue along with dehydration.   . Hyperlipidemia with target LDL less than 70 09/01/2014  . Mesenteric vein thrombosis   . Migraines    "maybe one/yr" (07/06/2012)  . NSTEMI (non-ST elevated myocardial infarction) ([place]) 07/2010, 08/2014   And unstable angina in February 20 13th; Echo 1/12/'16: Moderate concentric LVH. EF 60-65% with no regional WMA. Gr 1 DD, otherwise  normal  . Stroke ([place]) 2011; 2013   S/P cardiac cath - embolic stroke; denies residual (07/06/2012)  . 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 04/19/2013   No evidence of CVA on MRI/MRA & Dliated Eye Examination. ruptured blood vessel & not occlusion.   Assessment:   50 yr old male with CAD, hx MI x2, multiple stents and ISR admitted 12/19 with chest pain.  Received Lovenox 40 mg x 1 12/19 ~10pm.  To begin IV heparin.  For cardiac cath later today.  On Plavix daily, but not Aspirin.  Aspirin 324 mg x 1 given 12/19 pm.  Goal of Therapy:  Heparin level 0.3-0.7 units/ml Monitor platelets by anticoagulation protocol: Yes   Plan:   Heparin 4000 units IV x 1.  Heparin drip to begin at 1600 units/hr.  Expect cardiac cath about the time 6 hr heparin level would be due, so will defer level for now, and plan to f/u post-cath.  Arty Baumgartner, [place] Pager: [phone] 07/23/2016,9:46 AM

## 2016-07-24 ENCOUNTER — Encounter (HOSPITAL_COMMUNITY): Payer: Self-pay | Admitting: Cardiology

## 2016-07-24 DIAGNOSIS — I200 Unstable angina: Secondary | ICD-10-CM | POA: Diagnosis not present

## 2016-07-24 DIAGNOSIS — I519 Heart disease, unspecified: Secondary | ICD-10-CM

## 2016-07-24 DIAGNOSIS — I201 Angina pectoris with documented spasm: Secondary | ICD-10-CM

## 2016-07-24 DIAGNOSIS — I10 Essential (primary) hypertension: Secondary | ICD-10-CM | POA: Diagnosis not present

## 2016-07-24 LAB — HEMOGLOBIN A1C
HEMOGLOBIN A1C: 6.1 % — AB (ref 4.8–5.6)
MEAN PLASMA GLUCOSE: 128 mg/dL

## 2016-07-24 MED ORDER — METOPROLOL SUCCINATE ER 25 MG PO TB24: 75.0000 mg | ORAL_TABLET | Freq: Two times a day (BID) | ORAL | 0 refills | Status: DC

## 2016-07-24 NOTE — Progress Notes (Signed)
Patient Name: Dylan [name] Date of Encounter: 07/24/2016  Primary Cardiologist: [facility] Problem List     Principal Problem:   Chest pain Active Problems:   CAD S/P PCI with 3 DES stents to RCA and PTCA x2 for ISR and thrombosis    Dyslipidemia, statin intol   Essential hypertension   Hyperglycemia   Diastolic dysfunction     Subjective   NO complaints other than chronic PAC;s wants to go home   Inpatient Medications    Scheduled Meds: . clopidogrel  75 mg Oral Daily  . furosemide  40 mg Oral BID  . irbesartan  150 mg Oral Daily  . metoprolol succinate  50 mg Oral BID  . sodium chloride flush  3 mL Intravenous Q12H  . sodium chloride flush  3 mL Intravenous Q12H   Continuous Infusions:  PRN Meds: sodium chloride, acetaminophen, ibuprofen, metoprolol tartrate, morphine injection, nitroGLYCERIN, ondansetron (ZOFRAN) IV, sodium chloride flush   Vital Signs    Vitals:   07/23/16 2302 07/23/16 2351 07/24/16 0535 07/24/16 0841  BP: 123/79 126/80 120/79 (!) 130/92  Pulse:   72 74  Resp:      Temp:   97.9 F (36.6 C)   TempSrc:   Oral   SpO2:   98%   Weight:   292 lb 9.6 oz (132.7 kg)   Height:        Intake/Output Summary (Last 24 hours) at 07/24/16 0937 Last data filed at 07/24/16 0912  Gross per 24 hour  Intake          1208.22 ml  Output             1175 ml  Net            33.22 ml   Filed Weights   07/22/16 2206 07/23/16 0527 07/24/16 0535  Weight: 295 lb 6.4 oz (134 kg) 293 lb 8 oz (133.1 kg) 292 lb 9.6 oz (132.7 kg)    Physical Exam    GEN: Well nourished, well developed, in no acute distress.  HEENT: Grossly normal.  Neck: Supple, no JVD, carotid bruits, or masses. Cardiac: RRR, no murmurs, rubs, or gallops. No clubbing, cyanosis, edema.  Radials/DP/PT 2+ and equal bilaterally.  Respiratory:  Respirations regular and unlabored, clear to auscultation bilaterally. GI: Soft, nontender, nondistended, BS + x 4. MS: no deformity or  atrophy. Skin: warm and dry, no rash. Neuro:  Strength and sensation are intact. Psych: AAOx3.  Normal affect. Right radial A no hematoma  Labs    CBC  Recent Labs  07/22/16 1558  WBC 6.5  HGB 14.6  HCT 42.0  MCV 85.7  PLT 123456   Basic Metabolic Panel  Recent Labs  07/22/16 1558 07/22/16 2216  NA 140  --   K 4.0  --   CL 106  --   CO2 27  --   GLUCOSE 167*  --   BUN 16  --   CREATININE 1.00  --   CALCIUM 9.9  --   MG  --  1.9   Liver Function Tests No results for input(s): AST, ALT, ALKPHOS, BILITOT, PROT, ALBUMIN in the last 72 hours. No results for input(s): LIPASE, AMYLASE in the last 72 hours. Cardiac Enzymes  Recent Labs  07/22/16 2216 07/23/16 0441  TROPONINI <0.03 <0.03   BNP Invalid input(s): POCBNP D-Dimer No results for input(s): DDIMER in the last 72 hours. Hemoglobin A1C  Recent Labs  07/23/16 0441  HGBA1C 6.1*  Fasting Lipid Panel No results for input(s): CHOL, HDL, LDLCALC, TRIG, CHOLHDL, LDLDIRECT in the last 72 hours. Thyroid Function Tests No results for input(s): TSH, T4TOTAL, T3FREE, THYROIDAB in the last 72 hours.  Invalid input(s): FREET3  Telemetry    NSR PAC;s  - Personally Reviewed  ECG    NSR no acute changes  - Personally Reviewed  Radiology    Dg Chest 2 View  Result Date: 07/22/2016 CLINICAL DATA:  Recent onset of left-sided sharp and pressure-like upper chest discomfort with nausea and shortness of breath. History of previous MI, coronary artery disease with stent placement, former smoker. EXAM: CHEST  2 VIEW COMPARISON:  Chest x-ray of May 26, 2016 FINDINGS: The lungs are adequately inflated. There is no focal infiltrate. There is no pleural effusion. The heart is normal in size. A coronary artery stent is visible. The pulmonary vascularity is normal. The mediastinum is normal in width. The bony thorax exhibits no acute abnormality. There are metallic coils present in the right upper quadrant from a previous  embolization procedure. IMPRESSION: There is no active cardiopulmonary disease. Electronically Signed   By: David  Martinique M.D.   On: 07/22/2016 16:43    Cardiac Studies   Conclusion     The left ventricular systolic function is normal.  LV end diastolic pressure is normal.  The left ventricular ejection fraction is 55-65% by visual estimate.  Prox RCA to Dist RCA lesion, 15 %stenosed.  Prox LAD to Mid LAD lesion, 20 %stenosed.  Ost Cx to Prox Cx lesion, 20 %stenosed.   1. Nonobstructive CAD- no change from one year ago 2. Normal LV function and LVEDP  Plan: continue medical management.      Patient Profile     50 y.o. multiple stents to RCA admitted [facility] r/o cath with no significant residual CAD   Assessment & Plan    CAD: patent stents by cath continue ASA/Plavix PAC;s for now increase lopressor to 75 bid outpatient f/u Ellyn Hack he indicated Possibly trying CCB but in young patient with CAD I think more beta blocker appropriate   Signed, Jenkins Rouge, MD  07/24/2016, 9:37 AM

## 2016-07-24 NOTE — Discharge Summary (Signed)
Physician Discharge Summary  Dylan [name] [id number] DOB: [birth year] DOA: 07/22/2016  PCP: [name], PA  Admit date: 07/22/2016 Discharge date: 07/24/2016  Admitted From: home Disposition:  Home  Recommendations for Outpatient Follow-up:  1. Follow up with Cardiology in 1-2 weeks 2. Please obtain BMP/CBC in one week. 3. Check an A1c in 3-6 months as well as A1c was 6.1.  Home Health:No Equipment/Devices:none  Discharge Condition: Stable  CODE STATUS:full Diet recommendation: Heart Healthy   Brief/Interim Summary: 50 year old with past medical history of CAD, coronary artery disease with multiple interventions and stenting,  With a history of thrombosis, essential hypertension, mesenteric vein thrombosis of comes into the emergency department with chest pain.    Discharge Diagnoses:  Principal Problem:   Chest pain Active Problems:   CAD S/P PCI with 3 DES stents to RCA and PTCA x2 for ISR and thrombosis    Dyslipidemia, statin intol   Essential hypertension   Hyperglycemia   Diastolic dysfunction      Chest pain with coronary artery disease : Cardiology was consulted due to his high risk, they recommended a cardiac cath that showed:  The left ventricular ejection fraction is 55-65% by visual estimate.  Prox RCA to Dist RCA lesion, 15 %stenosed.  Prox LAD to Mid LAD lesion, 20 %stenosed.  Ost Cx to Prox Cx lesion, 20 %stenosed.  Nonobstructive CAD- no change from one year ago.  Normal LV function and LVEDP. Cardiology recommended to continue medical management, aspirin and Plavix and increase his metoprolol.   Chronic diastolic heart failure: Continue Lasix twice a day.  Dyslipidemia statin intolerant: Continued left some modifications.  Essential hypertension: Continue furosemide and long-acting metoprolol at a higher dose.  Hyperglycemia: A1c was 6.5, it was discussed with patient diet and lifestyle modification, will have to repeat  in 3-6 months. We'll have a low threshold to start oral hypoglycemic agents as an outpatient.   Discharge Instructions  Discharge Instructions    Diet - low sodium heart healthy    Complete by:  As directed    Increase activity slowly    Complete by:  As directed      Allergies as of 07/24/2016      Reactions   Crestor [rosuvastatin] Other (See Comments)   Zofran Alvis Lemmings Hcl] Nausea And Vomiting   Ace Inhibitors Cough   Lipitor [atorvastatin] Other (See Comments)   myalgia   Codeine Itching      Medication List    STOP taking these medications   metoprolol tartrate 25 MG tablet Commonly known as:  LOPRESSOR     TAKE these medications   BENICAR 20 MG tablet Generic drug:  olmesartan TAKE 1 TABLET(20 MG) BY MOUTH DAILY   clopidogrel 75 MG tablet Commonly known as:  PLAVIX TAKE 1 TABLET(75 MG) BY MOUTH DAILY WITH BREAKFAST   Evolocumab 140 MG/ML Soaj Inject 140 mg into the skin every 14 (fourteen) days.   furosemide 40 MG tablet Commonly known as:  LASIX Take 1 tablet (40 mg total) by mouth 2 (two) times daily.   ibuprofen 200 MG tablet Commonly known as:  ADVIL,MOTRIN Take 400 mg by mouth every 6 (six) hours as needed.   metoprolol succinate 25 MG 24 hr tablet Commonly known as:  TOPROL-XL Take 3 tablets (75 mg total) by mouth 2 (two) times daily. Take with or immediately following a meal. What changed:  See the new instructions.   nitroGLYCERIN 0.4 MG SL tablet Commonly known as:  NITROSTAT Place 1 tablet (  0.4 mg total) under the tongue every 5 (five) minutes x 3 doses as needed for chest pain.       Allergies  Allergen Reactions  . Crestor [Rosuvastatin] Other (See Comments)  . Zofran [Ondansetron Hcl] Nausea And Vomiting  . Ace Inhibitors Cough  . Lipitor [Atorvastatin] Other (See Comments)    myalgia  . Codeine Itching    Consultations:  Cardiology   Procedures/Studies: Dg Chest 2 View  Result Date: 07/22/2016 CLINICAL DATA:   Recent onset of left-sided sharp and pressure-like upper chest discomfort with nausea and shortness of breath. History of previous MI, coronary artery disease with stent placement, former smoker. EXAM: CHEST  2 VIEW COMPARISON:  Chest x-ray of May 26, 2016 FINDINGS: The lungs are adequately inflated. There is no focal infiltrate. There is no pleural effusion. The heart is normal in size. A coronary artery stent is visible. The pulmonary vascularity is normal. The mediastinum is normal in width. The bony thorax exhibits no acute abnormality. There are metallic coils present in the right upper quadrant from a previous embolization procedure. IMPRESSION: There is no active cardiopulmonary disease. Electronically Signed   By: David  Martinique M.D.   On: 07/22/2016 16:43      Subjective: He relates no chest pain or short of breath.  Discharge Exam: Vitals:   07/24/16 0535 07/24/16 0841  BP: 120/79 (!) 130/92  Pulse: 72 74  Resp:    Temp: 97.9 F (36.6 C)    Vitals:   07/23/16 2302 07/23/16 2351 07/24/16 0535 07/24/16 0841  BP: 123/79 126/80 120/79 (!) 130/92  Pulse:   72 74  Resp:      Temp:   97.9 F (36.6 C)   TempSrc:   Oral   SpO2:   98%   Weight:   132.7 kg (292 lb 9.6 oz)   Height:        General: Pt is alert, awake, not in acute distress Cardiovascular: RRR, S1/S2 +, no rubs, no gallops Respiratory: CTA bilaterally, no wheezing, no rhonchi Abdominal: Soft, NT, ND, bowel sounds + Extremities: no edema, no cyanosis    The results of significant diagnostics from this hospitalization (including imaging, microbiology, ancillary and laboratory) are listed below for reference.     Microbiology: No results found for this or any previous visit (from the past 240 hour(s)).   Labs: BNP (last 3 results) No results for input(s): BNP in the last 8760 hours. Basic Metabolic Panel:  Recent Labs Lab 07/22/16 1558 07/22/16 2216  NA 140  --   K 4.0  --   CL 106  --   CO2 27   --   GLUCOSE 167*  --   BUN 16  --   CREATININE 1.00  --   CALCIUM 9.9  --   MG  --  1.9   Liver Function Tests: No results for input(s): AST, ALT, ALKPHOS, BILITOT, PROT, ALBUMIN in the last 168 hours. No results for input(s): LIPASE, AMYLASE in the last 168 hours. No results for input(s): AMMONIA in the last 168 hours. CBC:  Recent Labs Lab 07/22/16 1558  WBC 6.5  HGB 14.6  HCT 42.0  MCV 85.7  PLT 225   Cardiac Enzymes:  Recent Labs Lab 07/22/16 2216 07/23/16 0441  TROPONINI <0.03 <0.03   BNP: Invalid input(s): POCBNP CBG:  Recent Labs Lab 07/23/16 2044  GLUCAP 169*   D-Dimer No results for input(s): DDIMER in the last 72 hours. Hgb A1c  Recent Labs  07/23/16  0441  HGBA1C 6.1*   Lipid Profile No results for input(s): CHOL, HDL, LDLCALC, TRIG, CHOLHDL, LDLDIRECT in the last 72 hours. Thyroid function studies No results for input(s): TSH, T4TOTAL, T3FREE, THYROIDAB in the last 72 hours.  Invalid input(s): FREET3 Anemia work up No results for input(s): VITAMINB12, FOLATE, FERRITIN, TIBC, IRON, RETICCTPCT in the last 72 hours. Urinalysis    Component Value Date/Time   COLORURINE AMBER (A) 08/26/2014 1455   APPEARANCEUR CLEAR 08/26/2014 1455   LABSPEC 1.014 08/26/2014 1455   PHURINE 5.0 08/26/2014 1455   GLUCOSEU NEGATIVE 08/26/2014 1455   HGBUR NEGATIVE 08/26/2014 1455   BILIRUBINUR NEGATIVE 08/26/2014 1455   KETONESUR NEGATIVE 08/26/2014 1455   PROTEINUR NEGATIVE 08/26/2014 1455   UROBILINOGEN 0.2 08/26/2014 1455   NITRITE NEGATIVE 08/26/2014 1455   LEUKOCYTESUR NEGATIVE 08/26/2014 1455   Sepsis Labs Invalid input(s): PROCALCITONIN,  WBC,  LACTICIDVEN Microbiology No results found for this or any previous visit (from the past 240 hour(s)).   Time coordinating discharge: Over 30 minutes  SIGNED:   Charlynne Cousins, MD  [facility] 07/24/2016, 9:52 AM Pager   If 7PM-7AM, please contact  night-coverage [email or web address] Password [email or web address]

## 2016-07-24 NOTE — Telephone Encounter (Signed)
  Cath report from 12/20   The left ventricular systolic function is normal.  LV end diastolic pressure is normal.  The left ventricular ejection fraction is 55-65% by visual estimate.  Prox RCA to Dist RCA lesion, 15 %stenosed.  Prox LAD to Mid LAD lesion, 20 %stenosed.  Ost Cx to Prox Cx lesion, 20 %stenosed.   1. Nonobstructive CAD- no change from one year ago 2. Normal LV function and LVEDP  Plan: continue medical management.

## 2016-09-04 ENCOUNTER — Telehealth: Payer: Self-pay | Admitting: *Deleted

## 2016-09-04 DIAGNOSIS — Z79899 Other long term (current) drug therapy: Secondary | ICD-10-CM

## 2016-09-04 DIAGNOSIS — E785 Hyperlipidemia, unspecified: Secondary | ICD-10-CM

## 2016-09-04 DIAGNOSIS — E781 Pure hyperglyceridemia: Secondary | ICD-10-CM

## 2016-09-04 NOTE — Telephone Encounter (Signed)
Mailed lab slip and letter. Patient states he is taking REPATHA , schedule to do self- injection tomorrow 09/05/16. Patient is also saving card.

## 2016-09-04 NOTE — Telephone Encounter (Signed)
-----   Message from Raiford Simmonds, RN sent at 06/05/2016 10:53 AM EDT ----- Recheck in 3 months hep. Panel,lipid-- FEB 2018 Check to see if patient restarted repatha Before sending lab slip out.  need to place orde that time.

## 2016-09-28 ENCOUNTER — Other Ambulatory Visit: Payer: Self-pay | Admitting: Cardiology

## 2016-09-29 NOTE — Telephone Encounter (Signed)
Rx(s) sent to pharmacy electronically.  

## 2016-10-03 ENCOUNTER — Encounter: Payer: Self-pay | Admitting: Cardiology

## 2016-10-03 ENCOUNTER — Ambulatory Visit (INDEPENDENT_AMBULATORY_CARE_PROVIDER_SITE_OTHER): Payer: BLUE CROSS/BLUE SHIELD | Admitting: Cardiology

## 2016-10-03 VITALS — BP 120/74 | HR 79 | Ht 74.0 in | Wt 300.0 lb

## 2016-10-03 DIAGNOSIS — E785 Hyperlipidemia, unspecified: Secondary | ICD-10-CM | POA: Diagnosis not present

## 2016-10-03 DIAGNOSIS — T82867D Thrombosis of cardiac prosthetic devices, implants and grafts, subsequent encounter: Secondary | ICD-10-CM

## 2016-10-03 DIAGNOSIS — E669 Obesity, unspecified: Secondary | ICD-10-CM

## 2016-10-03 DIAGNOSIS — Z79899 Other long term (current) drug therapy: Secondary | ICD-10-CM | POA: Diagnosis not present

## 2016-10-03 DIAGNOSIS — I5189 Other ill-defined heart diseases: Secondary | ICD-10-CM

## 2016-10-03 DIAGNOSIS — E781 Pure hyperglyceridemia: Secondary | ICD-10-CM

## 2016-10-03 DIAGNOSIS — I519 Heart disease, unspecified: Secondary | ICD-10-CM

## 2016-10-03 DIAGNOSIS — I10 Essential (primary) hypertension: Secondary | ICD-10-CM

## 2016-10-03 DIAGNOSIS — I2510 Atherosclerotic heart disease of native coronary artery without angina pectoris: Secondary | ICD-10-CM

## 2016-10-03 DIAGNOSIS — I214 Non-ST elevation (NSTEMI) myocardial infarction: Secondary | ICD-10-CM

## 2016-10-03 DIAGNOSIS — I491 Atrial premature depolarization: Secondary | ICD-10-CM

## 2016-10-03 DIAGNOSIS — Z9861 Coronary angioplasty status: Secondary | ICD-10-CM

## 2016-10-03 LAB — HEPATIC FUNCTION PANEL
ALBUMIN: 3.8 g/dL (ref 3.6–5.1)
ALT: 24 U/L (ref 9–46)
AST: 19 U/L (ref 10–35)
Alkaline Phosphatase: 88 U/L (ref 40–115)
BILIRUBIN TOTAL: 0.4 mg/dL (ref 0.2–1.2)
Bilirubin, Direct: 0.1 mg/dL (ref <=0.2)
Indirect Bilirubin: 0.3 mg/dL (ref 0.2–1.2)
TOTAL PROTEIN: 6.3 g/dL (ref 6.1–8.1)

## 2016-10-03 LAB — LIPID PANEL
Cholesterol: 94 mg/dL (ref <200)
HDL: 29 mg/dL — AB (ref >40)
LDL CALC: 17 mg/dL (ref <100)
TRIGLYCERIDES: 238 mg/dL — AB (ref <150)
Total CHOL/HDL Ratio: 3.2 Ratio (ref <5.0)
VLDL: 48 mg/dL — AB (ref <30)

## 2016-10-03 MED ORDER — METOPROLOL SUCCINATE ER 50 MG PO TB24: 50.0000 mg | ORAL_TABLET | Freq: Two times a day (BID) | ORAL | 3 refills | Status: DC

## 2016-10-03 NOTE — Progress Notes (Signed)
PCP: [name], PA  Clinic Note: Chief Complaint  Patient presents with  . Follow-up    4 MONTHS - known CAD-w/ extensive PCI  . Headache  . Shortness of Breath  . Chest Pain  . Edema    HPI: Dylan [name] is a 51 y.o. male with a PMH below who presents today for Essentially a post hospital evaluation for chest pain and palpitations. He actually underwent a cardiac catheterization..   November 2011: Non-STEMI. Occluded RCA at conus --> multiple Taxus DES stents  December 2013: Unstable angina --> focal 70% ISR in mid RCA at the overlapped stent segment. 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 --> ? F/u admission for SBO, perforated bowel - Exlap with partial bowel resection. -- converted from Warfarin to Pradaxa.  ? 2 weeks of CP - ? Crescendo Angina: Cath Dec 2016: Non-obstructive CAD - ~15% RCA ISR, p-mLAD & o-pCx 20%. Normal LV Fxn --NON-Anginal CP.  Chest pain evaluation in December 2017: Cardiac catheterization showed stable nonobstructive disease with patent stents.  Dylan [name] was last seen By me back in October. Since then he was up back in the hospital  Recent Hospitalizations: Chest pain evaluation 07/22/2016 - ruled out for MI. Cardiac cath performed.  Studies Reviewed:   ETT 06/03/2016: Exercised 7 minutes with no EKG changes to suggest ischemia. Rare PACs noted but did not increase with exercise.   Procedures: Left Heart Cath and Coronary Angiography - 07/23/2016      The left ventricular systolic function is normal.  LV end diastolic pressure is normal.  The left ventricular ejection fraction is 55-65% by visual estimate.  Prox RCA to Dist RCA lesion, 15 %stenosed.  Prox LAD to Mid LAD lesion,  20 %stenosed.  Ost Cx to Prox Cx lesion, 20 %stenosed.   1. Nonobstructive CAD- no change from one year ago 2. Normal LV function and LVEDP     Interval History: Dylan [name] presents today just confusing concerned because he still feels bad. He still has swelling in his hands and still notes the palpitations are persistent if not worsening. He does not have any resting or exertional dyspnea. He does elliptical and other activities without noticing any worsening symptoms and the palpitations get better with exertion. He says he does feel short of breath when he exerts himself, but is not complaining of any anginal type symptoms with rest or exertion. No PND, orthopnea to go along with edema. He has gained a significant medically since this past summer.  Although he notes intermittent palpitations, no arrhythmia and type symptoms noted. No syncope or near syncope symptoms   No TIA/amaurosis fugax symptoms. No melena, hematochezia, hematuria, or epstaxis. No claudication.  ROS: A comprehensive was performed. Review of Systems  Constitutional: Positive for malaise/fatigue. Negative for weight loss (Weight gain).  HENT: Negative for congestion and nosebleeds.   Respiratory: Positive for shortness of breath (With exertion). Negative for cough.   Cardiovascular: Positive for leg swelling (Along with hand swelling).  Gastrointestinal: Negative for abdominal pain, blood in stool, heartburn and melena.  Genitourinary: Negative for hematuria.  Musculoskeletal: Positive for joint pain. Negative for falls.  Neurological: Negative for dizziness.  Endo/Heme/Allergies: Does not bruise/bleed easily.  Psychiatric/Behavioral: Positive for depression (Seems kind of dysthymic). Negative for memory loss. The  patient is nervous/anxious and has insomnia.   All other systems reviewed and are negative.   Past Medical History:  Diagnosis Date  . CAD S/P percutaneous coronary angioplasty 07/2010; 07/2012; 08/2014     1) 2/'11: MI - 100% RCA - PCI (3 Taxus ION DES 3.0 x 28, 3.0 x 24 & 3.5 x 12 distal-prox); b) ISR of RCA stent - -AnngioSculpt PTCA; c) NSTEMI 1/'16: mild ISR with Thrombosis of RCA Stents (in setting of Mesenteric Vein Thrombosis) - Cutting Balloon PTCA; c. Re-look Cath 07/2015 & 07/2016: ~15% ISR in RCA,20% p-mLAD & o-pCx.  . Coronary stent thrombosis 08/15/2014  . Essential hypertension 07/07/2012  . Finding of multiple premature atrial contractions by electrocardiography    Symptomatic palpitations ; worse with poor sleep and fatigue along with dehydration.   . Hyperlipidemia with target LDL less than 70 09/01/2014  . Mesenteric vein thrombosis   . Migraines    "maybe one/yr" (07/06/2012)  . NSTEMI (non-ST elevated myocardial infarction) ([place]) 07/2010, 08/2014   And unstable angina in February 20 13th; Echo 1/12/'16: Moderate concentric LVH. EF 60-65% with no regional WMA. Gr 1 DD, otherwise normal  . Stroke ([place]) 2011; 2013   S/P cardiac cath - embolic stroke; denies residual (07/06/2012)  . 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 04/19/2013   No evidence of CVA on MRI/MRA & Dliated Eye Examination. ruptured blood vessel & not occlusion.    Past Surgical History:  Procedure Laterality Date  . BOWEL RESECTION N/A 08/28/2014   Procedure: SMALL BOWEL RESECTION;  Surgeon: [name], MD;  Location: [facility];  Service: General;  Laterality: N/A;  . CARDIAC CATHETERIZATION  04/18/2013  . CARDIAC CATHETERIZATION N/A 07/20/2015   Procedure: Left Heart Cath and Coronary Angiography;  Surgeon: [name], MD;  Location: [place] [facility];  Service: Cardiovascular;  15% ISR in the RCA stent segment, 20% proximal-mid LAD as well as ostial and proximal circumflex. Normal LV function.  Marland Kitchen CARDIAC CATHETERIZATION N/A 07/23/2016   Procedure: Left Heart Cath and Coronary Angiography;  Surgeon: Peter M  Martinique, MD;  Location: [facility];  Service: Cardiovascular: E 55-65%. ~15% diffuse RCA ISR, ~20% diffuse pLAD & pCx  . CORONARY ANGIOPLASTY  07/06/2012; 08/15/2014   a) 12/'13: PTCA of 80% ISR mRCA - AngioSculpt; b) PTCA of  ISR/thrombosis  . CORONARY ANGIOPLASTY WITH STENT PLACEMENT  07/2010   inferior wall MI - 3 Taxus Ion DES (3.0x23mm, 3.0x13mm, 3.5x108mm) to prox and mid RCA  . LAPAROSCOPIC APPENDECTOMY N/A 08/28/2014   Procedure: APPENDECTOMY LAPAROSCOPIC;  Surgeon: [name], MD;  Location: [facility];  Service: General;  Laterality: N/A;  . LAPAROSCOPY  08/28/2014   Procedure: LAPAROSCOPY DIAGNOSTIC;  Surgeon: [name], MD;  Location: [facility];  Service: General;;  . LEFT HEART CATHETERIZATION WITH CORONARY ANGIOGRAM N/A 06/11/2012   Procedure: LEFT HEART CATHETERIZATION WITH CORONARY ANGIOGRAM;  Surgeon: [name], MD;  Location: [facility];  Service: Cardiovascular;  Laterality: N/A;  . LEFT HEART CATHETERIZATION WITH CORONARY ANGIOGRAM N/A 04/18/2013   Procedure: LEFT HEART CATHETERIZATION WITH CORONARY ANGIOGRAM;  Surgeon: [name], MD;  Location[facility] C[facility] LAB;  Service: Cardiovascular;  Laterality: N/A;  . LEFT HEART CATHETERIZATION WITH CORONARY ANGIOGRAM N/A 08/15/2014   Procedure: LEFT HEART CATHETERIZATION WITH CORONARY ANGIOGRAM;  Surgeon: [name], MD;  Location: [facility] CATH LAB;  Service: Cardiovascular;  Laterality:  N/A;  . PERCUTANEOUS CORONARY STENT INTERVENTION (PCI-S) N/A 07/06/2012   Procedure: PERCUTANEOUS CORONARY STENT INTERVENTION (PCI-S);  Surgeon: [name], MD;  Location: [facility] C[facility] LAB;  Service: Cardiovascular;  Laterality: N/A;  . SEPTOPLASTY  2001  . TRANSTHORACIC ECHOCARDIOGRAM  08/15/2014   Moderate concentric LVH. EF 60-65% with no regional WMA. Gr 1 DD, otherwise normal    Current Meds  Medication Sig  . BENICAR 20 MG tablet TAKE 1 TABLET(20 MG) BY MOUTH DAILY  . clopidogrel (PLAVIX) 75 MG tablet TAKE 1 TABLET(75 MG) BY MOUTH  DAILY WITH BREAKFAST  . Evolocumab 140 MG/ML SOAJ Inject 140 mg into the skin every 14 (fourteen) days.  . furosemide (LASIX) 40 MG tablet TAKE 1 TABLET(40 MG) BY MOUTH TWICE DAILY  . ibuprofen (ADVIL,MOTRIN) 200 MG tablet Take 400 mg by mouth every 6 (six) hours as needed.  . metoprolol tartrate (LOPRESSOR) 25 MG tablet Take 25 mg by mouth daily.  . nitroGLYCERIN (NITROSTAT) 0.4 MG SL tablet Place 1 tablet (0.4 mg total) under the tongue every 5 (five) minutes x 3 doses as needed for chest pain.  . [DISCONTINUED] metoprolol succinate (TOPROL-XL) 25 MG 24 hr tablet Take 3 tablets (75 mg total) by mouth 2 (two) times daily. Take with or immediately following a meal.    Allergies  Allergen Reactions  . Crestor [Rosuvastatin] Other (See Comments)  . Zofran [Ondansetron Hcl] Nausea And Vomiting  . Ace Inhibitors Cough  . Lipitor [Atorvastatin] Other (See Comments)    myalgia  . Codeine Itching    Social History   Social History  . Marital status: Married    Spouse name: N/A  . Number of children: 2  . Years of education: 12   Occupational History  . English as a second language teacher   Social History Main Topics  . Smoking status: Former Smoker    Packs/day: 1.00    Years: 15.00    Types: Cigarettes    Quit date: 06/30/2010  . Smokeless tobacco: Former Systems developer    Types: [place] date: 06/30/2010  . Alcohol use 0.6 oz/week    1 Shots of liquor per week     Comment: a  glass of scotch per day  . Drug use: No  . Sexual activity: Yes   Other Topics Concern  . None   Social History Narrative  . None    family history includes Atrial fibrillation in his mother; Coronary artery disease in his father; Heart attack in his father, paternal grandfather, and paternal uncle; Mitral valve prolapse in his mother.  Wt Readings from Last 3 Encounters:  10/03/16 136.1 kg (300 lb)  07/24/16 132.7 kg (292 lb 9.6 oz)  05/29/16 130.8 kg (288 lb 6.4 oz)    PHYSICAL EXAM BP 120/74   Pulse 79    Ht 6\' 2"  (1.88 m)   Wt 136.1 kg (300 lb)   BMI 38.52 kg/m  General appearance: alert, cooperative, appears stated age, no distress, mildly obese and Healthy-appearing. HEENT: [place]/AT, EOMI, MMM, anicteric sclera Neck: no adenopathy, no carotid bruit, no JVD and supple, symmetrical, trachea midline Lungs: CTA B, normal percussion bilaterally and Nonlabored, good air movement Heart: RRR with ectopy, S1& S2 normal, no murmur, click, rub or gallop and normal apical impulse Abdomen: Soft, appropriately tender around the incision site. No HSM. Extremities: extremities normal, atraumatic, no cyanosis - trivial-1+ edema Pulses: 2+ and symmetric Neurologic: Alert and oriented X 3, Pleasant mood & affect. Normal coordination and gait  Adult ECG Report n/a   Other studies Reviewed: Additional studies/ records that were reviewed today include:  Recent Labs:   Lab Results  Component Value Date   CHOL 94 10/03/2016   HDL 29 (L) 10/03/2016   LDLCALC 17 10/03/2016   TRIG 238 (H) 10/03/2016   CHOLHDL 3.2 10/03/2016    ASSESSMENT / PLAN: Problem List Items Addressed This Visit    CAD S/P PCI with 3 DES stents to RCA and PTCA x2 for ISR and thrombosis  - Primary (Chronic)    His entire RCA from proximal to mid-distal is stented. He has had some is a restenosis as well as thrombosis, no recurrent episodes. He has had now 2 catheterizations one year apart with widely patent stents. We did a GXT that showed no evidence of ischemia prior to that. Remains on stable dose of beta blocker which had been increased to 75 twice a day during his hospital stay, but despite this his palpitations have not gotten any better. Plan will be to reduce back to 50 twice a day Toprol and continue Benicar. He is on Plavix without aspirin and is on Repatha with great results.      Relevant Medications   metoprolol tartrate (LOPRESSOR) 25 MG tablet   metoprolol succinate (TOPROL-XL) 50 MG 24 hr tablet   Coronary  stent thrombosis (Chronic)    This is in the setting of diffuse body thrombosis. As result I would not stop Plavix based on the extent of stent in his RCA.      Relevant Medications   metoprolol tartrate (LOPRESSOR) 25 MG tablet   metoprolol succinate (TOPROL-XL) 50 MG 24 hr tablet   Diastolic dysfunction (Chronic)    Clearly probably has some diastolic dysfunction that contributes to his exertional dyspnea. He is on a good dose of ARB and metoprolol with relatively well-controlled blood pressure. Not requiring any additional diuretic for now. - Continue with 40 twice a day Lasix in addition to ARB and Toprol.      Dyslipidemia, statin intol (Chronic)    Excellent response to being back on Repatha. Labs reviewed. Happy with the results.      Essential hypertension (Chronic)    Well-controlled on current meds      Relevant Medications   metoprolol tartrate (LOPRESSOR) 25 MG tablet   metoprolol succinate (TOPROL-XL) 50 MG 24 hr tablet   Other Relevant Orders   EKG 12-Lead   Finding of multiple premature atrial contractions by electrocardiography (Chronic)    These seem to be really bothering him. For now unassisted to continue with 50 mg twice a day of Toprol and use when necessary metoprolol tartrate. The factor V PACs did not give worse with exercise was reassuring. He also then had an ischemic evaluation completely with a cardiac catheterization. He just wanted reassurance that this was not a sign of him potentially having another heart attack. I think a lot of it is related to stress and anxiety. Is crucial.  Plan: Continue now with Toprol 50 twice a day and when necessary metoprolol. Things worsen, I would probably add diltiazem      Relevant Medications   metoprolol tartrate (LOPRESSOR) 25 MG tablet   metoprolol succinate (TOPROL-XL) 50 MG 24 hr tablet   Hypertriglyceridemia - TG >500 (Chronic)    Dramatic improvement again with Repatha. Continue to monitor.      Relevant  Medications   metoprolol tartrate (LOPRESSOR) 25 MG tablet   metoprolol succinate (TOPROL-XL) 50 MG 24 hr tablet  NSTEMI (non-ST elevated myocardial infarction) - 07/2010, 2013, 08/2014 (Chronic)    He is having weird symptoms, but no true anginal symptoms. Widely patent RCA stent by recent catheterization. Preserved EF.      Relevant Medications   metoprolol tartrate (LOPRESSOR) 25 MG tablet   metoprolol succinate (TOPROL-XL) 50 MG 24 hr tablet   Other Relevant Orders   EKG 12-Lead   Obesity (BMI 30-39.9) (Chronic)    I really think that this is the main driving force for his exertional dyspnea. He is considerably heavier than he had been after his hospitalization with portal vein thrombosis. He needs to get back down to the [facility] reasonable weight and I think than his dyspnea will improve. It may even help his palpitations.         Current medicines are reviewed at length with the patient today. (+/- concerns) still having palpitations despite increased Beta Blocker. The following changes have been made: -- see below  Patient Instructions  Medication change:  Increase Metoprolol Succinate to 50 mg twice daily.   Still continue to use Metoprolol Tartrate as needed.  Your physician wants you to follow-up in September/November 2018 with Dr. Melody Haver will receive a reminder letter in the mail two months in advance. If you don't receive a letter, please call our office to schedule the follow-up appointment.    Studies Ordered:   Orders Placed This Encounter  Procedures  . EKG 12-Lead      Glenetta Hew, M.D., M.S. Interventional Cardiologist   Pager # [phone] Phone # [phone] [address] [place], [place] [address]

## 2016-10-03 NOTE — Patient Instructions (Signed)
Medication change:  Increase Metoprolol Succinate to 50 mg twice daily.   Still continue to use Metoprolol Tartrate as needed.  Your physician wants you to follow-up in September/November 2018 with Dr. Melody Haver will receive a reminder letter in the mail two months in advance. If you don't receive a letter, please call our office to schedule the follow-up appointment.

## 2016-10-05 ENCOUNTER — Encounter: Payer: Self-pay | Admitting: Cardiology

## 2016-10-05 NOTE — Assessment & Plan Note (Signed)
Clearly probably has some diastolic dysfunction that contributes to his exertional dyspnea. He is on a good dose of ARB and metoprolol with relatively well-controlled blood pressure. Not requiring any additional diuretic for now. - Continue with 40 twice a day Lasix in addition to ARB and Toprol.

## 2016-10-05 NOTE — Assessment & Plan Note (Signed)
Excellent response to being back on Repatha. Labs reviewed. Happy with the results.

## 2016-10-05 NOTE — Assessment & Plan Note (Signed)
I really think that this is the main driving force for his exertional dyspnea. He is considerably heavier than he had been after his hospitalization with portal vein thrombosis. He needs to get back down to the [facility] reasonable weight and I think than his dyspnea will improve. It may even help his palpitations.

## 2016-10-05 NOTE — Assessment & Plan Note (Signed)
He is having weird symptoms, but no true anginal symptoms. Widely patent RCA stent by recent catheterization. Preserved EF.

## 2016-10-05 NOTE — Assessment & Plan Note (Signed)
These seem to be really bothering him. For now unassisted to continue with 50 mg twice a day of Toprol and use when necessary metoprolol tartrate. The factor V PACs did not give worse with exercise was reassuring. He also then had an ischemic evaluation completely with a cardiac catheterization. He just wanted reassurance that this was not a sign of him potentially having another heart attack. I think a lot of it is related to stress and anxiety. Is crucial.  Plan: Continue now with Toprol 50 twice a day and when necessary metoprolol. Things worsen, I would probably add diltiazem

## 2016-10-05 NOTE — Assessment & Plan Note (Signed)
Dramatic improvement again with Repatha. Continue to monitor.

## 2016-10-05 NOTE — Assessment & Plan Note (Signed)
This is in the setting of diffuse body thrombosis. As result I would not stop Plavix based on the extent of stent in his RCA.

## 2016-10-05 NOTE — Assessment & Plan Note (Signed)
His entire RCA from proximal to mid-distal is stented. He has had some is a restenosis as well as thrombosis, no recurrent episodes. He has had now 2 catheterizations one year apart with widely patent stents. We did a GXT that showed no evidence of ischemia prior to that. Remains on stable dose of beta blocker which had been increased to 75 twice a day during his hospital stay, but despite this his palpitations have not gotten any better. Plan will be to reduce back to 50 twice a day Toprol and continue Benicar. He is on Plavix without aspirin and is on Repatha with great results.

## 2016-10-05 NOTE — Assessment & Plan Note (Signed)
Well-controlled on current meds 

## 2016-10-27 ENCOUNTER — Other Ambulatory Visit: Payer: Self-pay | Admitting: Cardiology

## 2016-10-28 NOTE — Telephone Encounter (Signed)
REFILL 

## 2017-01-09 DIAGNOSIS — M1712 Unilateral primary osteoarthritis, left knee: Secondary | ICD-10-CM | POA: Diagnosis not present

## 2017-01-21 DIAGNOSIS — M1712 Unilateral primary osteoarthritis, left knee: Secondary | ICD-10-CM | POA: Diagnosis not present

## 2017-01-22 ENCOUNTER — Other Ambulatory Visit (HOSPITAL_COMMUNITY): Payer: Self-pay | Admitting: Nurse Practitioner

## 2017-01-22 NOTE — Telephone Encounter (Signed)
Rx(s) sent to pharmacy electronically.  

## 2017-02-13 ENCOUNTER — Emergency Department (HOSPITAL_COMMUNITY): Payer: BLUE CROSS/BLUE SHIELD

## 2017-02-13 ENCOUNTER — Emergency Department (HOSPITAL_COMMUNITY)
Admission: EM | Admit: 2017-02-13 | Discharge: 2017-02-13 | Discharge status: Against Medical Advice | Payer: BLUE CROSS/BLUE SHIELD | Attending: Emergency Medicine | Admitting: Emergency Medicine

## 2017-02-13 ENCOUNTER — Encounter (HOSPITAL_COMMUNITY): Payer: Self-pay | Admitting: Emergency Medicine

## 2017-02-13 DIAGNOSIS — R05 Cough: Secondary | ICD-10-CM | POA: Diagnosis not present

## 2017-02-13 DIAGNOSIS — Z8673 Personal history of transient ischemic attack (TIA), and cerebral infarction without residual deficits: Secondary | ICD-10-CM | POA: Insufficient documentation

## 2017-02-13 DIAGNOSIS — Z79899 Other long term (current) drug therapy: Secondary | ICD-10-CM | POA: Insufficient documentation

## 2017-02-13 DIAGNOSIS — Z9861 Coronary angioplasty status: Secondary | ICD-10-CM | POA: Insufficient documentation

## 2017-02-13 DIAGNOSIS — I2510 Atherosclerotic heart disease of native coronary artery without angina pectoris: Secondary | ICD-10-CM | POA: Diagnosis not present

## 2017-02-13 DIAGNOSIS — I10 Essential (primary) hypertension: Secondary | ICD-10-CM | POA: Insufficient documentation

## 2017-02-13 DIAGNOSIS — Z87891 Personal history of nicotine dependence: Secondary | ICD-10-CM | POA: Insufficient documentation

## 2017-02-13 DIAGNOSIS — R079 Chest pain, unspecified: Secondary | ICD-10-CM

## 2017-02-13 DIAGNOSIS — R0789 Other chest pain: Secondary | ICD-10-CM | POA: Diagnosis not present

## 2017-02-13 DIAGNOSIS — Z7902 Long term (current) use of antithrombotics/antiplatelets: Secondary | ICD-10-CM | POA: Diagnosis not present

## 2017-02-13 LAB — CBC
HEMATOCRIT: 41 % (ref 39.0–52.0)
Hemoglobin: 14.2 g/dL (ref 13.0–17.0)
MCH: 29.5 pg (ref 26.0–34.0)
MCHC: 34.6 g/dL (ref 30.0–36.0)
MCV: 85.2 fL (ref 78.0–100.0)
PLATELETS: 238 10*3/uL (ref 150–400)
RBC: 4.81 MIL/uL (ref 4.22–5.81)
RDW: 13.2 % (ref 11.5–15.5)
WBC: 7.1 10*3/uL (ref 4.0–10.5)

## 2017-02-13 LAB — BASIC METABOLIC PANEL
Anion gap: 9 (ref 5–15)
BUN: 15 mg/dL (ref 6–20)
CHLORIDE: 100 mmol/L — AB (ref 101–111)
CO2: 29 mmol/L (ref 22–32)
CREATININE: 1.2 mg/dL (ref 0.61–1.24)
Calcium: 9.1 mg/dL (ref 8.9–10.3)
GFR calc Af Amer: >60 mL/min (ref >60)
GFR calc non Af Amer: >60 mL/min (ref >60)
GLUCOSE: 186 mg/dL — AB (ref 65–99)
Potassium: 3.4 mmol/L — ABNORMAL LOW (ref 3.5–5.1)
Sodium: 138 mmol/L (ref 135–145)

## 2017-02-13 LAB — I-STAT TROPONIN, ED: Troponin i, poc: 0.01 ng/mL (ref 0.00–0.08)

## 2017-02-13 NOTE — ED Provider Notes (Signed)
[facility] DEPT Provider Note   CSN: [id number] Arrival date & time: 02/13/17  1359     History   Chief Complaint Chief Complaint  Patient presents with  . Chest Pain    HPI Dylan [name] is a 51 y.o. male presenting with central chest pressure starting at 7 AM this morning which has been constant and nonradiating. Patient reports taking nitroglycerin with some relief but the pain returned. He denies any associated diaphoresis, nausea, shortness of breath or other symptoms. He has a history of prior non-STEMI and stent placement. Last was last year and stable per patient. He states that when he presses firmly on his chest he can feel the discomfort and believes he may have pulled a muscle. He also explains that he will not be staying for further workup if his initial troponin is negative he wants to leave. He has been through this in the past with initial negative troponin and having to stay for repeat troponin and doesn't want to go through this today. He has a vacation scheduled and will be leaving and there is no way to convince him otherwise. He reports that his symptoms have improved since he's been here.  HPI  Past Medical History:  Diagnosis Date  . CAD S/P percutaneous coronary angioplasty 07/2010; 07/2012; 08/2014   1) 2/'11: MI - 100% RCA - PCI (3 Taxus ION DES 3.0 x 28, 3.0 x 24 & 3.5 x 12 distal-prox); b) ISR of RCA stent - -AnngioSculpt PTCA; c) NSTEMI 1/'16: mild ISR with Thrombosis of RCA Stents (in setting of Mesenteric Vein Thrombosis) - Cutting Balloon PTCA; c. Re-look Cath 07/2015 & 07/2016: ~15% ISR in RCA,20% p-mLAD & o-pCx.  . Coronary stent thrombosis 08/15/2014  . Essential hypertension 07/07/2012  . Finding of multiple premature atrial contractions by electrocardiography    Symptomatic palpitations ; worse with poor sleep and fatigue along with dehydration.   . Hyperlipidemia with target LDL less than 70 09/01/2014  . Mesenteric vein thrombosis   .  Migraines    "maybe one/yr" (07/06/2012)  . NSTEMI (non-ST elevated myocardial infarction) ([place]) 07/2010, 08/2014   And unstable angina in February 20 13th; Echo 1/12/'16: Moderate concentric LVH. EF 60-65% with no regional WMA. Gr 1 DD, otherwise normal  . Stroke ([place]) 2011; 2013   S/P cardiac cath - embolic stroke; denies residual (07/06/2012)  . 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 04/19/2013   No evidence of CVA on MRI/MRA & Dliated Eye Examination. ruptured blood vessel & not occlusion.    Patient Active Problem List   Diagnosis Date Noted  . Medication management 09/04/2016  . Hyperglycemia 07/22/2016  . Diastolic dysfunction 64/40/3474  . Finding of multiple premature atrial contractions by electrocardiography 10/05/2015  . Iron deficiency anemia 09/29/2014  . Obesity (BMI 30-39.9) 09/09/2014  . Perforation bowel - 2/2 Mesenteric Ischemia from Mesenteric Vein Thrombosis.   . Intestinal perforation ([place]) 08/26/2014  . Coronary stent thrombosis 08/15/2014  . Acute embolism and thrombosis of vein   . Mesenteric vein thrombosis   . 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 Portal vein thrombosis 08/05/2014  . 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 04/19/2013  . Possible Retinal artery branch occlusion of right eye = POST CATHETERIZATION -- by dilated Eye exam - no  evidence of embolic occlusoin; possible microperforation. NOT CVA 04/19/2013    Class: Acute  . Hypertriglyceridemia - TG >500 04/19/2013  . Dyslipidemia, statin intol 07/07/2012  . Essential hypertension 07/07/2012  . Smoker, quit 06/30/10 07/07/2012  . CAD S/P PCI with 3 DES stents to RCA and PTCA x2 for ISR and thrombosis  07/06/2012  . H/O: CVA (cerebrovascular accident), post cardiac cath, minimal speech  residual 11/11 post PCI 07/06/2012  . NSTEMI (non-ST elevated myocardial infarction) - 07/2010, 2013, 08/2014 07/04/2010    Past Surgical History:  Procedure Laterality Date  . BOWEL RESECTION N/A 08/28/2014   Procedure: SMALL BOWEL RESECTION;  Surgeon: [name], MD;  Location: [facility];  Service: General;  Laterality: N/A;  . CARDIAC CATHETERIZATION  04/18/2013  . CARDIAC CATHETERIZATION N/A 07/20/2015   Procedure: Left Heart Cath and Coronary Angiography;  Surgeon: [name], MD;  Location: [facility];  Service: Cardiovascular;  15% ISR in the RCA stent segment, 20% proximal-mid LAD as well as ostial and proximal circumflex. Normal LV function.  Marland Kitchen CARDIAC CATHETERIZATION N/A 07/23/2016   Procedure: Left Heart Cath and Coronary Angiography;  Surgeon: [name], MD;  Location: [facility];  Service: Cardiovascular: E 55-65%. ~15% diffuse RCA ISR, ~20% diffuse pLAD & pCx  . CORONARY ANGIOPLASTY  07/06/2012; 08/15/2014   a) 12/'13: PTCA of 80% ISR mRCA - AngioSculpt; b) PTCA of  ISR/thrombosis  . CORONARY ANGIOPLASTY WITH STENT PLACEMENT  07/2010   inferior wall MI - 3 Taxus Ion DES (3.0x25mm, 3.0x33mm, 3.5x53mm) to prox and mid RCA  . LAPAROSCOPIC APPENDECTOMY N/A 08/28/2014   Procedure: APPENDECTOMY LAPAROSCOPIC;  Surgeon: [name], MD;  Location: [facility];  Service: General;  Laterality: N/A;  . LAPAROSCOPY  08/28/2014   Procedure: LAPAROSCOPY DIAGNOSTIC;  Surgeon: [name], MD;  Location: [facility];  Service: General;;  . LEFT HEART CATHETERIZATION WITH CORONARY ANGIOGRAM N/A 06/11/2012   Procedure: LEFT HEART CATHETERIZATION WITH CORONARY ANGIOGRAM;  Surgeon: [name], MD;  Location: [facility] CATH LAB;  Service: Cardiovascular;  Laterality: N/A;  . LEFT HEART CATHETERIZATION WITH CORONARY ANGIOGRAM N/A 04/18/2013   Procedure: LEFT HEART CATHETERIZATION WITH CORONARY ANGIOGRAM;  Surgeon: [name], MD;  Location: [facility] CATH LAB;  Service: Cardiovascular;   Laterality: N/A;  . LEFT HEART CATHETERIZATION WITH CORONARY ANGIOGRAM N/A 08/15/2014   Procedure: LEFT HEART CATHETERIZATION WITH CORONARY ANGIOGRAM;  Surgeon: [name], MD;  Location: [facility] CATH LAB;  Service: Cardiovascular;  Laterality: N/A;  . PERCUTANEOUS CORONARY STENT INTERVENTION (PCI-S) N/A 07/06/2012   Procedure: PERCUTANEOUS CORONARY STENT INTERVENTION (PCI-S);  Surgeon: [name], MD;  Location[facility] CATH LAB;  Service: Cardiovascular;  Laterality: N/A;  . SEPTOPLASTY  2001  . TRANSTHORACIC ECHOCARDIOGRAM  08/15/2014   Moderate concentric LVH. EF 60-65% with no regional WMA. Gr 1 DD, otherwise normal       Home Medications    Prior to Admission medications   Medication Sig Start Date End Date Taking? Authorizing Provider  clopidogrel (PLAVIX) 75 MG tablet TAKE 1 TABLET(75 MG) BY MOUTH DAILY WITH BREAKFAST 10/28/16  Yes [name], MD  Evolocumab 140 MG/ML SOAJ Inject 140 mg into the skin every 14 (fourteen) days. 06/11/16  Yes [name], MD  furosemide (LASIX) 40 MG tablet TAKE 1 TABLET(40 MG) BY MOUTH TWICE DAILY 09/29/16  Yes [name], MD  ibuprofen (ADVIL,MOTRIN) 200 MG tablet Take 800 mg by mouth every 6 (six) hours as needed for moderate pain.  Yes [provider]  metoprolol succinate (TOPROL-XL) 50 MG 24 hr tablet Take 1 tablet (50 mg total) by mouth 2 (two) times daily. Take with or immediately following a meal. 10/03/16 02/13/17 Yes [name], MD  metoprolol tartrate (LOPRESSOR) 25 MG tablet Take 25 mg by mouth daily as needed (heart rate).  09/18/16  Yes [provider]  nitroGLYCERIN (NITROSTAT) 0.4 MG SL tablet Place 1 tablet (0.4 mg total) under the tongue every 5 (five) minutes x 3 doses as needed for chest pain. 10/03/15  Yes [name], MD  olmesartan (BENICAR) 20 MG tablet TAKE 1 TABLET(20 MG) BY MOUTH DAILY 01/22/17  Yes [name], MD  Potassium 99 MG TABS Take 99 mg by mouth daily.   Yes [provider]    Family History Family History  Problem Relation Age of Onset  . Atrial fibrillation Mother   . Mitral valve prolapse Mother   . Coronary artery disease Father        CABG, aortic aneursym,  . Heart attack Father   . Heart attack Paternal Uncle   . Heart attack Paternal Grandfather     Social History Social History  Substance Use Topics  . Smoking status: Former Smoker    Packs/day: 1.00    Years: 15.00    Types: Cigarettes    Quit date: 06/30/2010  . Smokeless tobacco: Current User    Types: Snuff    Last attempt to quit: 06/30/2010  . Alcohol use 4.2 oz/week    7 Shots of liquor per week     Comment: a  glass of scotch per day     Allergies   Crestor [rosuvastatin]; Zofran [ondansetron hcl]; Ace inhibitors; Lipitor [atorvastatin]; and Codeine   Review of Systems Review of Systems  Constitutional: Negative for chills, diaphoresis and fever.  HENT: Negative for ear pain and sore throat.   Eyes: Negative for pain and visual disturbance.  Respiratory: Positive for chest tightness. Negative for cough, shortness of breath, wheezing and stridor.   Cardiovascular: Positive for chest pain. Negative for palpitations.  Gastrointestinal: Negative for abdominal distention, abdominal pain, nausea and vomiting.  Genitourinary: Negative for dysuria and hematuria.  Musculoskeletal: Negative for arthralgias, back pain, gait problem, joint swelling, myalgias, neck pain and neck stiffness.  Skin: Negative for color change, pallor and rash.  Neurological: Negative for dizziness, seizures, syncope, weakness and light-headedness.     Physical Exam Updated Vital Signs BP 136/78   Pulse 82   Temp 98.3 F (36.8 C) (Oral)   Resp 18   Ht 6\' 1"  (1.854 m)   Wt 129.3 kg (285 lb)   SpO2 98%   BMI 37.60 kg/m   Physical Exam  Constitutional: He appears well-developed and well-nourished. No distress.  Patient is well-appearing, nontoxic and afebrile lying comfortably  in bed in no acute distress.  HENT:  Head: Normocephalic and atraumatic.  Eyes: Conjunctivae and EOM are normal.  Neck: Normal range of motion.  Cardiovascular: Normal rate, regular rhythm, normal heart sounds and intact distal pulses.  Exam reveals no gallop and no friction rub.   No murmur heard. Pulmonary/Chest: Effort normal and breath sounds normal. No respiratory distress. He has no wheezes. He has no rales. He exhibits tenderness.  Abdominal: Soft. He exhibits no distension and no mass. There is no tenderness. There is no rebound and no guarding.  Musculoskeletal: He exhibits no edema.  Neurological: He is alert.  Skin: Skin is warm and dry. No  rash noted. He is not diaphoretic. No erythema. No pallor.  Psychiatric: He has a normal mood and affect.  Nursing note and vitals reviewed.    ED Treatments / Results  Labs (all labs ordered are listed, but only abnormal results are displayed) Labs Reviewed  BASIC METABOLIC PANEL - Abnormal; Notable for the following:       Result Value   Potassium 3.4 (*)    Chloride 100 (*)    Glucose, Bld 186 (*)    All other components within normal limits  CBC  I-STAT TROPOININ, ED    EKG  EKG Interpretation  Date/Time:  Friday February 13 2017 14:01:00 EDT Ventricular Rate:  81 PR Interval:  166 QRS Duration: 82 QT Interval:  360 QTC Calculation: 418 R Axis:   29 Text Interpretation:  Normal sinus rhythm Cannot rule out Anterior infarct , age undetermined Abnormal ECG Confirmed by Charlesetta Shanks [phone]) on 02/13/2017 3:29:49 PM       Radiology Dg Chest 2 View  Result Date: 02/13/2017 CLINICAL DATA:  Pt c/o center chest pain substernal this morning. Pt reports he took nitro 30 minutes ago, and has had a headache since. Pt denies cough, congestion, n/v. Hx 2 MI, HTN, CAD, former smoker, coil in place in liver. EXAM: CHEST  2 VIEW COMPARISON:  07/22/2016 FINDINGS: Heart size is normal. There are no focal consolidations or pleural  effusions. No pulmonary edema. Embolization coil identified in the right upper quadrant. IMPRESSION: No evidence for acute cardiopulmonary abnormality. Electronically Signed   By: Nolon Nations M.D.   On: 02/13/2017 14:29    Procedures Procedures (including critical care time)  Medications Ordered in ED Medications - No data to display   Initial Impression / Assessment and Plan / ED Course  I have reviewed the triage vital signs and the nursing notes.  Pertinent labs & imaging results that were available during my care of the patient were reviewed by me and considered in my medical decision making (see chart for details).     Patient presents with central chest discomfort pressure reproducible with deep palpation nonradiating without associated shortness of breath, nausea or diaphoresis.  Patient explains that his prior MIs were accompanied by radiation to the jaw, nausea, diaphoresis and this does not feel the same. Labs unremarkable and initial troponin negative, chest x-ray negative, EKG with some artifacts.  He reports that he understands that he will be leaving AMA if his initial troponin was negative and he is ready to go home to leave for his planned vacation. Attempted to explain to patient the risk of leaving and recommended serial troponins given his history. Patient clearly understands the workup for chest pain and risks as he has had MI and multiple caths with stents. He refuses to stay for troponin trending. He states that he will have access to a hospital where he is going and if he experiences any worsening or new symptoms he will be seen immediately.  Reviewed patient's EKG with Dr Johnney Killian which had some artifact and ordered repeat EKG for better reading the patient refused and was already dressed and ready to leave  Patient left AMA  We discussed the nature and purpose, risks and benefits, as well as, the alternatives of treatment. Time was given to allow the  opportunity to ask questions and consider their options, and after the discussion, the patient decided to refuse the offerred treatment. The patient was informed that refusal could lead to, but was not limited to, death,  permanent disability, or severe pain. Prior to refusing, I determined that the patient had the capacity to make their decision and understood the consequences of that decision. After refusal, I made every reasonable opportunity to treat them to the best of my ability.  The patient was notified that they may return to the emergency department at any time for further treatment.    Final Clinical Impressions(s) / ED Diagnoses   Final diagnoses:  Chest pain, unspecified type    New Prescriptions New Prescriptions   No medications on file     Dossie Der 02/13/17 1547    Charlesetta Shanks, MD 02/19/17 [phone]

## 2017-02-13 NOTE — ED Notes (Signed)
PT requesting to leave AMA. Risk of leaving explained by myself and PA. PT states that he is fine and would like to go home. PT refuses to let me obtain new EKG ordered by PA. PT ambulatory out of unit

## 2017-02-13 NOTE — ED Triage Notes (Signed)
Pt reports new central CP, no radiation, today, endorses dizziness, denies SOB, n/v.  Resp e/u at this time. Pt reports hx MI, states "feels different."

## 2017-02-22 ENCOUNTER — Other Ambulatory Visit: Payer: Self-pay | Admitting: Cardiology

## 2017-03-31 DIAGNOSIS — I10 Essential (primary) hypertension: Secondary | ICD-10-CM | POA: Diagnosis not present

## 2017-03-31 DIAGNOSIS — E785 Hyperlipidemia, unspecified: Secondary | ICD-10-CM | POA: Diagnosis not present

## 2017-03-31 DIAGNOSIS — Z1329 Encounter for screening for other suspected endocrine disorder: Secondary | ICD-10-CM | POA: Diagnosis not present

## 2017-04-07 DIAGNOSIS — I491 Atrial premature depolarization: Secondary | ICD-10-CM | POA: Diagnosis not present

## 2017-04-07 DIAGNOSIS — E790 Hyperuricemia without signs of inflammatory arthritis and tophaceous disease: Secondary | ICD-10-CM | POA: Diagnosis not present

## 2017-04-07 DIAGNOSIS — Z0000 Encounter for general adult medical examination without abnormal findings: Secondary | ICD-10-CM | POA: Diagnosis not present

## 2017-04-07 DIAGNOSIS — Z7189 Other specified counseling: Secondary | ICD-10-CM | POA: Diagnosis not present

## 2017-04-08 ENCOUNTER — Encounter: Payer: Self-pay | Admitting: Cardiology

## 2017-04-08 ENCOUNTER — Ambulatory Visit (INDEPENDENT_AMBULATORY_CARE_PROVIDER_SITE_OTHER): Payer: BLUE CROSS/BLUE SHIELD | Admitting: Cardiology

## 2017-04-08 VITALS — BP 110/78 | HR 69 | Ht 74.0 in | Wt 297.0 lb

## 2017-04-08 DIAGNOSIS — T82867D Thrombosis of cardiac prosthetic devices, implants and grafts, subsequent encounter: Secondary | ICD-10-CM | POA: Diagnosis not present

## 2017-04-08 DIAGNOSIS — E669 Obesity, unspecified: Secondary | ICD-10-CM | POA: Diagnosis not present

## 2017-04-08 DIAGNOSIS — E785 Hyperlipidemia, unspecified: Secondary | ICD-10-CM

## 2017-04-08 DIAGNOSIS — I5189 Other ill-defined heart diseases: Secondary | ICD-10-CM

## 2017-04-08 DIAGNOSIS — I519 Heart disease, unspecified: Secondary | ICD-10-CM | POA: Diagnosis not present

## 2017-04-08 DIAGNOSIS — I10 Essential (primary) hypertension: Secondary | ICD-10-CM

## 2017-04-08 DIAGNOSIS — I491 Atrial premature depolarization: Secondary | ICD-10-CM

## 2017-04-08 DIAGNOSIS — R002 Palpitations: Secondary | ICD-10-CM

## 2017-04-08 DIAGNOSIS — Z9861 Coronary angioplasty status: Secondary | ICD-10-CM | POA: Diagnosis not present

## 2017-04-08 DIAGNOSIS — I2510 Atherosclerotic heart disease of native coronary artery without angina pectoris: Secondary | ICD-10-CM | POA: Diagnosis not present

## 2017-04-08 MED ORDER — NEBIVOLOL HCL 20 MG PO TABS: 20.0000 mg | ORAL_TABLET | ORAL | 0 refills | Status: DC

## 2017-04-08 NOTE — Progress Notes (Signed)
PCP: [name], PA  Clinic Note: Chief Complaint  Patient presents with  . Follow-up    CAD, palpitations  . Headache  . Shortness of Breath  . Edema    Legs    HPI: ABRHAM [name] is a 51 y.o. male with a PMH below who presents today for 6 month and post hospital follow-up for CAD and palpitations. He also notes some shortness of breath and leg swelling..  November 2011: Non-STEMI. Occluded RCA at conus --> multiple Taxus DES stents   December 2013: Unstable angina --> focal 70% ISR in mid RCA at the overlapped stent segment. 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 -->   F/u admission for SBO, perforated bowel - Exlap with partial bowel resection. -- converted from Warfarin to Pradaxa.  ? 2 weeks of CP - ? Crescendo Angina: Cath Dec 2016: Non-obstructive CAD - ~15% RCA ISR, p-mLAD & o-pCx 20%. Normal LV Fxn --NON-Anginal CP.   Chest pain evaluation in December 2017: Cardiac catheterization showed stable nonobstructive disease with patent stents.  Dylan [name] was last seen on 10/03/2016  Recent Hospitalizations:   02/13/2017 ER visit for chest pain- constant, nonradiating pain (he indicates that this was mostly palpitations) . Nitroglycerin but not sustained. Ruled out for MI with negative troponin..  Studies Personally Reviewed - (if available, images/films reviewed: From Epic Chart or Care Everywhere)  None  Interval History: Rector returns today overall doing better than he was back in July when he came in with palpitations. He has been noticing more frequent and regular palpitations that are somewhat concerning and associated with notable chest discomfort. He also is noticing that he is fatigued a bit more and is  having more swelling the ankles and feet than before. He says that the metoprolol seems to be helping the palpitations and some, but he is just so fatigued. He has not having classic anginal chest tightness pressure with rest or exertion. In the absence of his palpitations, he denies any discomfort at all. No PND or orthopnea, but he is noting edema. No real resting or exertional dyspnea with exception of the technologist being deconditioned. Despite having the palpitations, he denies any syncope/near syncope or TIA/amaurosis fugax symptoms.  No melena, hematochezia, hematuria, or epstaxis. No claudication.  ROS: A comprehensive was performed. Review of Systems  Constitutional: Positive for malaise/fatigue (Generalized fatigue. Lack of get up and go.). Negative for weight loss (His weight is a very labile having gained weight from July, which have been of weight loss since earlier this year.).  HENT: Negative for congestion and nosebleeds.   Respiratory: Positive for shortness of breath (Probably more related to deconditioning). Negative for stridor.   Cardiovascular: Positive for palpitations. Negative for claudication and leg swelling.  Gastrointestinal: Positive for nausea (When he has bad palpitation spells). Negative for abdominal pain, blood in stool, constipation and melena.  Genitourinary: Negative for hematuria.  Skin: Negative.   Neurological: Positive for headaches.  Endo/Heme/Allergies: Does not bruise/bleed easily.  Psychiatric/Behavioral: Positive for depression (More dysthymia). Negative for memory loss. The patient is nervous/anxious and has insomnia.   All other systems reviewed and are negative.  I have reviewed and (if needed) personally updated the patient's problem list, medications, allergies, past medical and surgical history, social and family history.  Past Medical History:  Diagnosis Date  . CAD S/P percutaneous coronary angioplasty 07/2010; 07/2012; 08/2014   1)  2/'11: MI - 100% RCA - PCI (3 Taxus ION DES 3.0 x 28, 3.0 x 24 & 3.5 x 12 distal-prox); b) ISR of RCA stent - -AnngioSculpt PTCA; c) NSTEMI 1/'16: mild ISR with Thrombosis of RCA Stents (in setting of Mesenteric Vein Thrombosis) - Cutting Balloon PTCA; c. Re-look Cath 07/2015 & 07/2016: ~15% ISR in RCA,20% p-mLAD & o-pCx.  . Coronary stent thrombosis 08/15/2014  . Essential hypertension 07/07/2012  . Finding of multiple premature atrial contractions by electrocardiography    Symptomatic palpitations ; worse with poor sleep and fatigue along with dehydration.   . Hyperlipidemia with target LDL less than 70 09/01/2014  . Mesenteric vein thrombosis   . Migraines    "maybe one/yr" (07/06/2012)  . NSTEMI (non-ST elevated myocardial infarction) ([place]) 07/2010, 08/2014   And unstable angina in February 20 13th; Echo 1/12/'16: Moderate concentric LVH. EF 60-65% with no regional WMA. Gr 1 DD, otherwise normal  . Stroke ([place]) 2011; 2013   S/P cardiac cath - embolic stroke; denies residual (07/06/2012)  . 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 04/19/2013   No evidence of CVA on MRI/MRA & Dliated Eye Examination. ruptured blood vessel & not occlusion.    Past Surgical History:  Procedure Laterality Date  . BOWEL RESECTION N/A 08/28/2014   Procedure: SMALL BOWEL RESECTION;  Surgeon: [name], MD;  Location: [facility];  Service: General;  Laterality: N/A;  . CARDIAC CATHETERIZATION  04/18/2013  . CARDIAC CATHETERIZATION N/A 07/20/2015   Procedure: Left Heart Cath and Coronary Angiography;  Surgeon: [name], MD;  Location: [facility];  Service: Cardiovascular;  15% ISR in the RCA stent segment, 20% proximal-mid LAD as well as ostial and proximal circumflex. Normal LV function.  Marland Kitchen CARDIAC CATHETERIZATION N/A 07/23/2016   Procedure: Left Heart Cath and Coronary Angiography;  Surgeon: [name],  MD;  Location: [facility];  Service: Cardiovascular: E 55-65%. ~15% diffuse RCA ISR, ~20% diffuse pLAD & pCx  . CORONARY ANGIOPLASTY  07/06/2012; 08/15/2014   a) 12/'13: PTCA of 80% ISR mRCA - AngioSculpt; b) PTCA of  ISR/thrombosis  . CORONARY ANGIOPLASTY WITH STENT PLACEMENT  07/2010   inferior wall MI - 3 Taxus Ion DES (3.0x88mm, 3.0x51mm, 3.5x43mm) to prox and mid RCA  . LAPAROSCOPIC APPENDECTOMY N/A 08/28/2014   Procedure: APPENDECTOMY LAPAROSCOPIC;  Surgeon: [name], MD;  Location: [facility];  Service: General;  Laterality: N/A;  . LAPAROSCOPY  08/28/2014   Procedure: LAPAROSCOPY DIAGNOSTIC;  Surgeon: [name], MD;  Location: [facility];  Service: General;;  . LEFT HEART CATHETERIZATION WITH CORONARY ANGIOGRAM N/A 06/11/2012   Procedure: LEFT HEART CATHETERIZATION WITH CORONARY ANGIOGRAM;  Surgeon: [name], MD;  Location: [facility] C[facility];  Service: Cardiovascular;  Laterality: N/A;  . LEFT HEART CATHETERIZATION WITH CORONARY ANGIOGRAM N/A 04/18/2013   Procedure: LEFT HEART CATHETERIZATION WITH CORONARY ANGIOGRAM;  Surgeon: [name], MD;  Location: [facility] CATH LAB;  Service: Cardiovascular;  Laterality: N/A;  . LEFT HEART CATHETERIZATION WITH CORONARY ANGIOGRAM N/A 08/15/2014   Procedure: LEFT HEART CATHETERIZATION WITH CORONARY ANGIOGRAM;  Surgeon: [name], MD;  Location: [facility] CATH LAB;  Service: Cardiovascular;  Laterality: N/A;  . PERCUTANEOUS CORONARY STENT INTERVENTION (PCI-S) N/A 07/06/2012   Procedure: PERCUTANEOUS CORONARY STENT INTERVENTION (PCI-S);  Surgeon: [name], MD;  Location: [facility] CATH LAB;  Service: Cardiovascular;  Laterality: N/A;  . SEPTOPLASTY  2001  . TRANSTHORACIC ECHOCARDIOGRAM  08/15/2014   Moderate concentric LVH. EF 60-65% with no regional WMA. Gr 1 DD, otherwise normal   Cardiac cath 07/23/2016: EF 55-60%.    Current Meds  Medication Sig  . clopidogrel (PLAVIX) 75 MG tablet TAKE 1 TABLET(75 MG) BY MOUTH DAILY WITH BREAKFAST  .  Evolocumab 140 MG/ML SOAJ Inject 140 mg into the skin every 14 (fourteen) days.  . furosemide (LASIX) 40 MG tablet TAKE 1 TABLET(40 MG) BY MOUTH TWICE DAILY  . ibuprofen (ADVIL,MOTRIN) 200 MG tablet Take 800 mg by mouth every 6 (six) hours as needed for moderate pain.   . metoprolol tartrate (LOPRESSOR) 25 MG tablet Take 25 mg by mouth daily as needed (heart rate).   . nitroGLYCERIN (NITROSTAT) 0.4 MG SL tablet Place 1 tablet (0.4 mg total) under the tongue every 5 (five) minutes x 3 doses as needed for chest pain.  Marland Kitchen olmesartan (BENICAR) 20 MG tablet TAKE 1 TABLET(20 MG) BY MOUTH DAILY  . Potassium 99 MG TABS Take 99 mg by mouth daily.    Allergies  Allergen Reactions  . Crestor [Rosuvastatin] Other (See Comments)  . Zofran [Ondansetron Hcl] Nausea And Vomiting  . Ace Inhibitors Cough  . Lipitor [Atorvastatin] Other (See Comments)    myalgia  . Codeine Itching    Social History   Social History  . Marital status: Married    Spouse name: N/A  . Number of children: 2  . Years of education: 12   Occupational History  . English as a second language teacher   Social History Main Topics  . Smoking status: Former Smoker    Packs/day: 1.00    Years: 15.00    Types: Cigarettes    Quit date: 06/30/2010  . Smokeless tobacco: Current User    Types: Snuff    Last attempt to quit: 06/30/2010  . Alcohol use 4.2 oz/week    7 Shots of liquor per week     Comment: a  glass of scotch per day  . Drug use: No  . Sexual activity: Yes   Other Topics Concern  . None   Social History Narrative  . None    family history includes Atrial fibrillation in his mother; Coronary artery disease in his father; Heart attack in his father, paternal grandfather, and paternal uncle; Mitral valve prolapse in his mother.  Wt Readings from Last 3 Encounters:  04/08/17 297 lb (134.7 kg)  02/13/17 285 lb (129.3 kg)  10/03/16 300 lb (136.1 kg)    PHYSICAL EXAM BP 110/78   Pulse 69   Ht 6\' 2"  (1.88 m)   Wt 297  lb (134.7 kg)   BMI 38.13 kg/m  Physical Exam  Constitutional: He is oriented to person, place, and time. He appears well-developed and well-nourished. No distress.  Well-groomed. Moderately obese  HENT:  Head: Normocephalic and atraumatic.  Eyes: Pupils are equal, round, and reactive to light. EOM are normal. No scleral icterus.  Neck: Normal range of motion. No JVD present.  Cardiovascular: Normal rate, normal heart sounds and intact distal pulses.  Exam reveals no gallop and no friction rub.   No murmur heard. Pulmonary/Chest: Effort normal and breath sounds normal. No respiratory distress. He has no wheezes. He has no rales.  Abdominal: Soft. Bowel sounds are normal. He exhibits no distension. There is no tenderness. There is no rebound.  Musculoskeletal: Normal  range of motion.  Neurological: He is alert and oriented to person, place, and time.  Skin: Skin is warm and dry. No rash noted.  Psychiatric: He has a normal mood and affect. His behavior is normal. Judgment and thought content normal.  Nursing note and vitals reviewed.   Adult ECG Report  Rate: 69 ;  Rhythm: normal sinus rhythm and Normal axis, intervals and durations.;   Narrative Interpretation: Normal EKG  Other studies Reviewed: Additional studies/ records that were reviewed today include:  Recent Labs:  Last A1c 6.4. LFTs normal. Creatinine 0.95. Lab Results  Component Value Date   CHOL 94 10/03/2016   HDL 29 (L) 10/03/2016   LDLCALC 17 10/03/2016   TRIG 238 (H) 10/03/2016   CHOLHDL 3.2 10/03/2016  - summary of labs from PCP noted in assessment and plan segment  Lab Results  Component Value Date   CREATININE 1.20 02/13/2017   BUN 15 02/13/2017   NA 138 02/13/2017   K 3.4 (L) 02/13/2017   CL 100 (L) 02/13/2017   CO2 29 02/13/2017    ASSESSMENT / PLAN: Problem List Items Addressed This Visit    CAD S/P PCI with 3 DES stents to RCA and PTCA x2 for ISR and thrombosis  - Primary (Chronic)    Essentially  complete stenting of the RCA from proximal to distal. Mild restenosis with otherwise patent vessels noted on 2 consecutive cardiac catheterizations. This is preceded by GXT which is also negative for ischemia. Remains on stable regimen, however we will change beta blocker to Bystolic ostensibly for less fatigue. He is on an ARB which will be held until we know where his blood pressure stands. He is taking Plavix without aspirin. He is on Repatha - great results      Relevant Medications   Nebivolol HCl (BYSTOLIC) 20 MG TABS   Other Relevant Orders   EKG 12-Lead (Completed)   Coronary stent thrombosis (Chronic)    Lifelong Plavix      Relevant Medications   Nebivolol HCl (BYSTOLIC) 20 MG TABS   Diastolic dysfunction (Chronic)    No active heart failure symptoms at this time. Seems euvolemic. On standing twice a day Lasix..  Once we stabilize blood pressure with Bystolic over Toprol, will need to restart ARB.      Dyslipidemia, statin intol (Chronic)    Excellent response having gone back on Repatha. Most recent labs showed total cholesterol 107, cholesterol is 106, HDL 35, LDL 43.      Essential hypertension (Chronic)    Well-controlled on current meds. Since we are switching to Bystolic from Toprol 50 twice a day, I will have him hold his ARB until seen in follow-up at [facility] for reassessment of blood pressure. Restart based on pressures with Bystolic on board.      Relevant Medications   Nebivolol HCl (BYSTOLIC) 20 MG TABS   Finding of multiple premature atrial contractions by electrocardiography (Chronic)    Needs to be on beta blocker, these are bothering him 6 significantly at this point. With such fatigue, I'm going to switch from metoprolol to Bystolic hoping to minimize adverse reaction.  I explained pathophysiology of frequent PACs and symptomatic pauses etc.      Relevant Medications   Nebivolol HCl (BYSTOLIC) 20 MG TABS   Obesity (BMI 30-39.9) (Chronic)    This  continues to be the driving force for his exertional dyspnea. His weights go up and down, I admonished him to continue to try to lose weight  and get down to where he was years ago.  The patient understands the need to lose weight with diet and exercise. We have discussed specific strategies for this.       Other Visit Diagnoses    Palpitations       Relevant Orders   EKG 12-Lead (Completed)      Current medicines are reviewed at length with the patient today. (+/- concerns) fatigue The following changes have been made: n/a  Patient Instructions  MEDICATION INSTRUCTIONS  DO NOT TAKE BENICAR  UNTIL INSTRUCTED NEXT WEEK WITH [facility]  MAY USE METOPROLOL ON AS NEEDED BASIS.  START BYSTYOLIC 20 MG  TAKE ONE TABLET IN THE MORNIG . START 04/09/17.      Your physician recommends that you schedule a follow-up appointment in [place]     Your physician wants you to follow-up in [place].You will receive a reminder letter in the mail two months in advance. If you don't receive a letter, please call our office to schedule the follow-up appointment.      If you need a refill on your cardiac medications before your next appointment, please call your pharmacy.     Studies Ordered:   Orders Placed This Encounter  Procedures  . EKG 12-Lead      Glenetta Hew, M.D., M.S. Interventional Cardiologist   Pager # [phone] Phone # [phone] [address] [place], [place] [address]

## 2017-04-08 NOTE — Patient Instructions (Signed)
MEDICATION INSTRUCTIONS  DO NOT TAKE BENICAR  UNTIL INSTRUCTED NEXT WEEK WITH [facility]  MAY USE METOPROLOL ON AS NEEDED BASIS.  START BYSTYOLIC 20 MG  TAKE ONE TABLET IN THE MORNIG . START 04/09/17.      Your physician recommends that you schedule a follow-up appointment in [place]     Your physician wants you to follow-up in [place].You will receive a reminder letter in the mail two months in advance. If you don't receive a letter, please call our office to schedule the follow-up appointment.      If you need a refill on your cardiac medications before your next appointment, please call your pharmacy.

## 2017-04-11 ENCOUNTER — Encounter: Payer: Self-pay | Admitting: Cardiology

## 2017-04-11 NOTE — Assessment & Plan Note (Signed)
This continues to be the driving force for his exertional dyspnea. His weights go up and down, I admonished him to continue to try to lose weight and get down to where he was years ago.  The patient understands the need to lose weight with diet and exercise. We have discussed specific strategies for this.

## 2017-04-11 NOTE — Assessment & Plan Note (Signed)
No active heart failure symptoms at this time. Seems euvolemic. On standing twice a day Lasix..  Once we stabilize blood pressure with Bystolic over Toprol, will need to restart ARB.

## 2017-04-11 NOTE — Assessment & Plan Note (Signed)
Lifelong Plavix

## 2017-04-11 NOTE — Assessment & Plan Note (Signed)
Excellent response having gone back on Repatha. Most recent labs showed total cholesterol 107, cholesterol is 106, HDL 35, LDL 43.

## 2017-04-11 NOTE — Assessment & Plan Note (Signed)
Needs to be on beta blocker, these are bothering him 6 significantly at this point. With such fatigue, I'm going to switch from metoprolol to Bystolic hoping to minimize adverse reaction.  I explained pathophysiology of frequent PACs and symptomatic pauses etc.

## 2017-04-11 NOTE — Assessment & Plan Note (Signed)
Essentially complete stenting of the RCA from proximal to distal. Mild restenosis with otherwise patent vessels noted on 2 consecutive cardiac catheterizations. This is preceded by GXT which is also negative for ischemia. Remains on stable regimen, however we will change beta blocker to Bystolic ostensibly for less fatigue. He is on an ARB which will be held until we know where his blood pressure stands. He is taking Plavix without aspirin. He is on Repatha - great results

## 2017-04-11 NOTE — Assessment & Plan Note (Signed)
Well-controlled on current meds. Since we are switching to Bystolic from Toprol 50 twice a day, I will have him hold his ARB until seen in follow-up at [facility] for reassessment of blood pressure. Restart based on pressures with Bystolic on board.

## 2017-04-15 ENCOUNTER — Ambulatory Visit (INDEPENDENT_AMBULATORY_CARE_PROVIDER_SITE_OTHER): Payer: BLUE CROSS/BLUE SHIELD | Admitting: Pharmacist

## 2017-04-15 VITALS — BP 110/62 | HR 64

## 2017-04-15 DIAGNOSIS — I10 Essential (primary) hypertension: Secondary | ICD-10-CM | POA: Diagnosis not present

## 2017-04-15 DIAGNOSIS — E785 Hyperlipidemia, unspecified: Secondary | ICD-10-CM | POA: Diagnosis not present

## 2017-04-15 MED ORDER — EVOLOCUMAB 140 MG/ML ~~LOC~~ SOAJ: 140.0000 mg | SUBCUTANEOUS | 11 refills | Status: DC

## 2017-04-15 NOTE — Assessment & Plan Note (Signed)
LDL at desired goal with Repatha 140mg  every 14 days. Patient denies problems with therapy and new rx  Was sent to prefer pharmacy.

## 2017-04-15 NOTE — Patient Instructions (Addendum)
Return for a  follow up appointment in as needed  Your blood pressure today is 110/62 pulse 64  Check your blood pressure at home daily (if able) and keep record of the readings.  Take your BP meds as follows: *ALL medication as prescribed*  Bring your record of home blood pressures to your next appointment.  Exercise as you're able, try to walk approximately 30 minutes per day.  Keep salt intake to a minimum, especially watch canned and prepared boxed foods.  Eat more fresh fruits and vegetables and fewer canned items.  Avoid eating in fast food restaurants.    HOW TO TAKE YOUR BLOOD PRESSURE: . Rest 5 minutes before taking your blood pressure. .  Don't smoke or drink caffeinated beverages for at least 30 minutes before. . Take your blood pressure before (not after) you eat. . Sit comfortably with your back supported and both feet on the floor (don't cross your legs). . Elevate your arm to heart level on a table or a desk. . Use the proper sized cuff. It should fit smoothly and snugly around your bare upper arm. There should be enough room to slip a fingertip under the cuff. The bottom edge of the cuff should be 1 inch above the crease of the elbow. . Ideally, take 3 measurements at one sitting and record the average.

## 2017-04-15 NOTE — Assessment & Plan Note (Signed)
Blood pressure remains with goal range since metoprolol was changed to bystolic. Patient reports less palpitations and denies problems with current therapy. Patient never stopped taking Benicar; therefore, no additional; therapy adjustment are needed today. Also noted HR remains appropriate for beta-blocker therapy. Will continue current therapy without changes and follow up as needed.

## 2017-04-15 NOTE — Progress Notes (Signed)
Patient ID: Dylan [name]                 DOB: 09-May-1966                      MRN: [id number]     HPI: Dylan [name] is a 51 y.o. male referred by Dr. Ellyn Hack to HTN clinic. PMH includes CAD s/p PCI with 3 stent placements, coronary stent thrombosis, diastolic dysfunction, dyslipidemia, essential hypertension and obesity.  During most recent cardiologist visit his metoprolol succinate was changed to Bystolic 20mg  to improve fatigue symptoms. Patient was instructed to hold Benicar (olmesartan) until Bystolic response known but patient never stopped taking Benicar. Denies dizziness, fatigues, lightheaded or problems with increased palpitations. Still has some metoprolol tartrare for PRN use but haven't used in > 6 months.  Current HTN meds:  Furosemide 40mg  twice daily Metoprolol tartrate 25mg  - PRN for palpitations Nebivolol 20mg  tablets every day Olmesartan 20mg  daily - on hold  Current lipid meds: Repatha 140mg  every 14 days  Previously tried:  ACEi - cough  BP goal: 130/80  LDL goal: 70mg /dL  Family History: family history includes Atrial fibrillation in his mother; Coronary artery disease in his father; Heart attack in his father, paternal grandfather, and paternal uncle; Mitral valve prolapse in his mother  Social History: quit tobacco in 2011, some alcohol  Exercise: lots of walking at home but no additional exercise at home  Home BP readings: none available (pulse range 61-91 bpm per wrist monitor)  Relevant laboratory values:  CHO 94; TG 238; HDL 29; LDL 17 (3/2/22018- on Repatha)  Wt Readings from Last 3 Encounters:  04/08/17 297 lb (134.7 kg)  02/13/17 285 lb (129.3 kg)  10/03/16 300 lb (136.1 kg)   BP Readings from Last 3 Encounters:  04/15/17 110/62  04/08/17 110/78  02/13/17 100/65   Pulse Readings from Last 3 Encounters:  04/15/17 64  04/08/17 69  02/13/17 73    Past Medical History:  Diagnosis Date  . CAD S/P percutaneous coronary  angioplasty 07/2010; 07/2012; 08/2014   1) 2/'11: MI - 100% RCA - PCI (3 Taxus ION DES 3.0 x 28, 3.0 x 24 & 3.5 x 12 distal-prox); b) ISR of RCA stent - -AnngioSculpt PTCA; c) NSTEMI 1/'16: mild ISR with Thrombosis of RCA Stents (in setting of Mesenteric Vein Thrombosis) - Cutting Balloon PTCA; c. Re-look Cath 07/2015 & 07/2016: ~15% ISR in RCA,20% p-mLAD & o-pCx.  . Coronary stent thrombosis 08/15/2014  . Essential hypertension 07/07/2012  . Finding of multiple premature atrial contractions by electrocardiography    Symptomatic palpitations ; worse with poor sleep and fatigue along with dehydration.   . Hyperlipidemia with target LDL less than 70 09/01/2014  . Mesenteric vein thrombosis   . Migraines    "maybe one/yr" (07/06/2012)  . NSTEMI (non-ST elevated myocardial infarction) ([place]) 07/2010, 08/2014   And unstable angina in February 20 13th; Echo 1/12/'16: Moderate concentric LVH. EF 60-65% with no regional WMA. Gr 1 DD, otherwise normal  . Stroke ([place]) 2011; 2013   S/P cardiac cath - embolic stroke; denies residual (07/06/2012)  . 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 04/19/2013   No evidence of CVA on MRI/MRA & Dliated Eye Examination. ruptured blood vessel & not occlusion.    Current Outpatient Prescriptions on File Prior to Visit  Medication Sig Dispense Refill  .  clopidogrel (PLAVIX) 75 MG tablet TAKE 1 TABLET(75 MG) BY MOUTH DAILY WITH BREAKFAST 90 tablet 2  . furosemide (LASIX) 40 MG tablet TAKE 1 TABLET(40 MG) BY MOUTH TWICE DAILY 180 tablet 0  . Nebivolol HCl (BYSTOLIC) 20 MG TABS Take 1 tablet (20 mg total) by mouth every morning. 14 tablet 0  . olmesartan (BENICAR) 20 MG tablet TAKE 1 TABLET(20 MG) BY MOUTH DAILY 30 tablet 5  . ibuprofen (ADVIL,MOTRIN) 200 MG tablet Take 800 mg by mouth every 6 (six) hours as needed for moderate pain.     . metoprolol tartrate (LOPRESSOR) 25 MG tablet  Take 25 mg by mouth daily as needed (heart rate).   4  . nitroGLYCERIN (NITROSTAT) 0.4 MG SL tablet Place 1 tablet (0.4 mg total) under the tongue every 5 (five) minutes x 3 doses as needed for chest pain. (Patient not taking: Reported on 04/15/2017) 25 tablet 2  . Potassium 99 MG TABS Take 99 mg by mouth daily.     No current facility-administered medications on file prior to visit.     Allergies  Allergen Reactions  . Crestor [Rosuvastatin] Other (See Comments)  . Zofran [Ondansetron Hcl] Nausea And Vomiting  . Ace Inhibitors Cough  . Lipitor [Atorvastatin] Other (See Comments)    myalgia  . Codeine Itching    Blood pressure 110/62, pulse 64.  Essential hypertension Blood pressure remains with goal range since metoprolol was changed to bystolic. Patient reports less palpitations and denies problems with current therapy. Patient never stopped taking Benicar; therefore, no additional; therapy adjustment are needed today. Also noted HR remains appropriate for beta-blocker therapy. Will continue current therapy without changes and follow up as needed.  Dyslipidemia, statin intol LDL at desired goal with Repatha 140mg  every 14 days. Patient denies problems with therapy and new rx  Was sent to prefer pharmacy.   Dylan [name] PharmD, BCPS, Dylan [name] [id number] 04/15/2017 9:10 PM

## 2017-04-20 ENCOUNTER — Telehealth: Payer: Self-pay | Admitting: Cardiology

## 2017-04-20 NOTE — Telephone Encounter (Signed)
LMOM ; patient to call back   (See clinic note from 04/15/2017 - patient stated his PACs were better controlled with bystolic and BP at goal.)   New medication reported by patient today  Synjardy and Uloric  Noted: Uloric may increase risk of anxiety, Afib, Aflutter and EKG abnormalities in ~1% of population.   Patient to call back and discuss current symptoms with pharmacist. Potential recommendation include holding uloric for 2 weeks and re-assess symptoms

## 2017-04-20 NOTE — Telephone Encounter (Signed)
New message      Pt c/o medication issue:  1. Name of Medication:  bystolic  2. How are you currently taking this medication (dosage and times per day)?  20mg   3. Are you having a reaction (difficulty breathing--STAT)?   4. What is your medication issue?  Pt takes medication at 6am.  By 11pm he is having to take a metoprolol pill because he is having PAC's. This started happening last thurs/fri.  Pt also want nurse to know that he started taking synjardy and uloric (gout meds) approx 2 weeks ago.  He takes these pills at 6am also.  Please advise

## 2017-04-20 NOTE — Telephone Encounter (Signed)
FORWARD TO CVRR- [id number]

## 2017-04-23 NOTE — Telephone Encounter (Signed)
LMOM; Patient to call back if additional help needed with medication management, or BP

## 2017-05-11 ENCOUNTER — Telehealth: Payer: Self-pay | Admitting: Cardiology

## 2017-05-11 MED ORDER — NEBIVOLOL HCL 20 MG PO TABS: 20.0000 mg | ORAL_TABLET | ORAL | 1 refills | Status: DC

## 2017-05-11 NOTE — Telephone Encounter (Signed)
New Message     *STAT* If patient is at the pharmacy, call can be transferred to refill team.   1. Which medications need to be refilled? (please list name of each medication and dose if known) bystolic   2. Which pharmacy/location (including street and city if local pharmacy) is medication to be sent to? [facility]  3. Do they need a 30 day or 90 day supply?  [place]

## 2017-05-11 NOTE — Telephone Encounter (Signed)
Refill sent as requested. 

## 2017-05-12 ENCOUNTER — Telehealth: Payer: Self-pay | Admitting: Cardiology

## 2017-05-12 NOTE — Telephone Encounter (Signed)
Bystolic 20 mg #3 lot # [id number] exp 12/20 given to patient. Pharmacy unable to get until next week

## 2017-05-12 NOTE — Telephone Encounter (Signed)
Patient calling the office for samples of medication:   1.  What medication and dosage are you requesting samples for? Bystolic 20mg -Just one bottle until he gets his medicine  2.  Are you currently out of this medication? Out -he says he is on his way now

## 2017-05-21 ENCOUNTER — Other Ambulatory Visit: Payer: Self-pay | Admitting: Cardiology

## 2017-05-21 NOTE — Telephone Encounter (Signed)
REFILL 

## 2017-06-06 ENCOUNTER — Observation Stay (HOSPITAL_COMMUNITY)
Admission: EM | Admit: 2017-06-06 | Discharge: 2017-06-08 | Disposition: A | Discharge status: Home / Self Care | Payer: BLUE CROSS/BLUE SHIELD | Attending: Family Medicine | Admitting: Family Medicine

## 2017-06-06 ENCOUNTER — Encounter (HOSPITAL_COMMUNITY): Payer: Self-pay

## 2017-06-06 ENCOUNTER — Emergency Department (HOSPITAL_COMMUNITY): Payer: BLUE CROSS/BLUE SHIELD

## 2017-06-06 DIAGNOSIS — R072 Precordial pain: Secondary | ICD-10-CM | POA: Diagnosis not present

## 2017-06-06 DIAGNOSIS — R079 Chest pain, unspecified: Secondary | ICD-10-CM | POA: Diagnosis present

## 2017-06-06 DIAGNOSIS — Z6836 Body mass index (BMI) 36.0-36.9, adult: Secondary | ICD-10-CM | POA: Insufficient documentation

## 2017-06-06 DIAGNOSIS — E669 Obesity, unspecified: Secondary | ICD-10-CM | POA: Insufficient documentation

## 2017-06-06 DIAGNOSIS — E785 Hyperlipidemia, unspecified: Secondary | ICD-10-CM | POA: Diagnosis present

## 2017-06-06 DIAGNOSIS — I2510 Atherosclerotic heart disease of native coronary artery without angina pectoris: Secondary | ICD-10-CM

## 2017-06-06 DIAGNOSIS — Z8673 Personal history of transient ischemic attack (TIA), and cerebral infarction without residual deficits: Secondary | ICD-10-CM | POA: Insufficient documentation

## 2017-06-06 DIAGNOSIS — I10 Essential (primary) hypertension: Secondary | ICD-10-CM | POA: Diagnosis present

## 2017-06-06 DIAGNOSIS — Z7984 Long term (current) use of oral hypoglycemic drugs: Secondary | ICD-10-CM | POA: Diagnosis not present

## 2017-06-06 DIAGNOSIS — E119 Type 2 diabetes mellitus without complications: Secondary | ICD-10-CM | POA: Diagnosis not present

## 2017-06-06 DIAGNOSIS — I81 Portal vein thrombosis: Secondary | ICD-10-CM | POA: Insufficient documentation

## 2017-06-06 DIAGNOSIS — I252 Old myocardial infarction: Secondary | ICD-10-CM | POA: Diagnosis not present

## 2017-06-06 DIAGNOSIS — Z955 Presence of coronary angioplasty implant and graft: Secondary | ICD-10-CM | POA: Diagnosis not present

## 2017-06-06 DIAGNOSIS — Z8249 Family history of ischemic heart disease and other diseases of the circulatory system: Secondary | ICD-10-CM | POA: Insufficient documentation

## 2017-06-06 DIAGNOSIS — Z87891 Personal history of nicotine dependence: Secondary | ICD-10-CM | POA: Insufficient documentation

## 2017-06-06 DIAGNOSIS — M1712 Unilateral primary osteoarthritis, left knee: Secondary | ICD-10-CM | POA: Diagnosis not present

## 2017-06-06 DIAGNOSIS — R0789 Other chest pain: Secondary | ICD-10-CM | POA: Diagnosis not present

## 2017-06-06 DIAGNOSIS — E781 Pure hyperglyceridemia: Secondary | ICD-10-CM | POA: Diagnosis not present

## 2017-06-06 DIAGNOSIS — Z7902 Long term (current) use of antithrombotics/antiplatelets: Secondary | ICD-10-CM | POA: Insufficient documentation

## 2017-06-06 DIAGNOSIS — G43909 Migraine, unspecified, not intractable, without status migrainosus: Secondary | ICD-10-CM | POA: Insufficient documentation

## 2017-06-06 HISTORY — DX: Type 2 diabetes mellitus without complications: E11.9

## 2017-06-06 LAB — BASIC METABOLIC PANEL
Anion gap: 7 (ref 5–15)
BUN: 19 mg/dL (ref 6–20)
CALCIUM: 9.2 mg/dL (ref 8.9–10.3)
CHLORIDE: 104 mmol/L (ref 101–111)
CO2: 27 mmol/L (ref 22–32)
CREATININE: 1.11 mg/dL (ref 0.61–1.24)
GFR calc Af Amer: >60 mL/min (ref >60)
GFR calc non Af Amer: >60 mL/min (ref >60)
GLUCOSE: 130 mg/dL — AB (ref 65–99)
Potassium: 3.9 mmol/L (ref 3.5–5.1)
Sodium: 138 mmol/L (ref 135–145)

## 2017-06-06 LAB — CBC
HEMATOCRIT: 46.1 % (ref 39.0–52.0)
HEMOGLOBIN: 15.4 g/dL (ref 13.0–17.0)
MCH: 29.2 pg (ref 26.0–34.0)
MCHC: 33.4 g/dL (ref 30.0–36.0)
MCV: 87.3 fL (ref 78.0–100.0)
Platelets: 212 10*3/uL (ref 150–400)
RBC: 5.28 MIL/uL (ref 4.22–5.81)
RDW: 14 % (ref 11.5–15.5)
WBC: 7.3 10*3/uL (ref 4.0–10.5)

## 2017-06-06 LAB — I-STAT TROPONIN, ED: Troponin i, poc: 0.01 ng/mL (ref 0.00–0.08)

## 2017-06-06 LAB — TROPONIN I: Troponin I: <0.03 ng/mL (ref <0.03)

## 2017-06-06 MED ORDER — PANTOPRAZOLE SODIUM 40 MG PO TBEC
40.0000 mg | DELAYED_RELEASE_TABLET | Freq: Every day | ORAL | Status: DC
  Administered 2017-06-07 – 2017-06-08 (×2): 40 mg via ORAL
  Filled 2017-06-06 (×2): qty 1

## 2017-06-06 MED ORDER — CLOPIDOGREL BISULFATE 75 MG PO TABS
75.0000 mg | ORAL_TABLET | Freq: Every day | ORAL | Status: DC
  Administered 2017-06-07 – 2017-06-08 (×2): 75 mg via ORAL
  Filled 2017-06-06 (×2): qty 1

## 2017-06-06 MED ORDER — IBUPROFEN 600 MG PO TABS: 600.0000 mg | ORAL_TABLET | Freq: Four times a day (QID) | ORAL | Status: DC | PRN

## 2017-06-06 MED ORDER — NEBIVOLOL HCL 10 MG PO TABS
20.0000 mg | ORAL_TABLET | Freq: Every day | ORAL | Status: DC
  Administered 2017-06-07 – 2017-06-08 (×2): 20 mg via ORAL
  Filled 2017-06-06 (×2): qty 2

## 2017-06-06 MED ORDER — METFORMIN HCL ER 500 MG PO TB24
1000.0000 mg | ORAL_TABLET | Freq: Every day | ORAL | Status: DC
  Filled 2017-06-06: qty 2

## 2017-06-06 MED ORDER — FEBUXOSTAT 40 MG PO TABS
80.0000 mg | ORAL_TABLET | Freq: Every day | ORAL | Status: DC
  Administered 2017-06-07 – 2017-06-08 (×2): 80 mg via ORAL
  Filled 2017-06-06 (×3): qty 2

## 2017-06-06 MED ORDER — ENOXAPARIN SODIUM 40 MG/0.4ML ~~LOC~~ SOLN
40.0000 mg | Freq: Every day | SUBCUTANEOUS | Status: DC
  Administered 2017-06-06 – 2017-06-07 (×2): 40 mg via SUBCUTANEOUS
  Filled 2017-06-06 (×2): qty 0.4

## 2017-06-06 MED ORDER — ACETAMINOPHEN 325 MG PO TABS
650.0000 mg | ORAL_TABLET | Freq: Four times a day (QID) | ORAL | Status: DC | PRN
  Administered 2017-06-06: 650 mg via ORAL
  Filled 2017-06-06: qty 2

## 2017-06-06 MED ORDER — FUROSEMIDE 40 MG PO TABS
40.0000 mg | ORAL_TABLET | Freq: Two times a day (BID) | ORAL | Status: DC
  Administered 2017-06-07 – 2017-06-08 (×3): 40 mg via ORAL
  Filled 2017-06-06 (×4): qty 1

## 2017-06-06 MED ORDER — MORPHINE SULFATE (PF) 4 MG/ML IV SOLN
4.0000 mg | INTRAVENOUS | Status: DC | PRN
  Administered 2017-06-06 – 2017-06-08 (×12): 4 mg via INTRAVENOUS
  Filled 2017-06-06 (×13): qty 1

## 2017-06-06 MED ORDER — NITROGLYCERIN 0.4 MG SL SUBL
0.4000 mg | SUBLINGUAL_TABLET | SUBLINGUAL | Status: DC | PRN
  Administered 2017-06-06: 0.4 mg via SUBLINGUAL
  Filled 2017-06-06: qty 1

## 2017-06-06 MED ORDER — EMPAGLIFLOZIN-METFORMIN HCL ER 25-1000 MG PO TB24: 1.0000 | ORAL_TABLET | Freq: Every day | ORAL | Status: DC

## 2017-06-06 NOTE — Progress Notes (Signed)
PHARMACY COMMUNICATION   Per medication safety policy, oral diabetic SGLT-2 inhibitor therapy has been interrupted during hospitalization. May re-initiate on discharge if appropriate.  Pt is on combo med empagliflozin+metformin; metformin has been continued.  Wynona Neat, PharmD, BCPS  06/06/2017 11:35 PM

## 2017-06-06 NOTE — ED Triage Notes (Signed)
Onset 3-4 weeks intermittant chest pain.  Onset last night pain worsened, pain is now constant, "feels like something sitting on chest", nausea.  Reports diaphoresis, none at this time.  Pt did not take any NTG.  BP last night systolic 95.

## 2017-06-06 NOTE — H&P (Addendum)
History and Physical    [name]:[id number] DOB: [birth year] DOA: 06/06/2017  PCP: [name], PA Patient coming from: Home  Chief Complaint: Chest pain, anterior  HPI: Dylan [name] is a 51 y.o. male with medical history significant of CAD s/p NSTEMI with stents, hepatic vein thrombosis, migraine, stroke HLD, HTN, DM, elevated uric acid who presents for chest pain.  He reports that he has been having intermittent chest pain for about 1 month.  These pains are dull, but also sharp, located in the left side of his sternum at about the 2nd intercostal space.  He has noticed them intermittently, about 2-3X per day, they last about an hour then subside.  Over the last week or so they have been increasing in intensity and duration.  He went to a haunted house last night and had pain that came on and didn't go away.  He was able to go to sleep, but when he woke up the pain was worse and now radiated into his back, jaw and left shoulder.  These were 6/10 at the worst and associated with nausea, SOB, and a feeling of being weak.  He also has noted intermittent blurry vision and lightheadedness over this same period of time.  He has a history of swelling in his left lower extremity worse than right and this is dependent and improves in the morning.  This is not new, he has no pain in the left leg.  He has a history of chronic OA in the left knee.    ED Course: His first Troponin was negative.  First EKG showed no new ST elevation.  V5 with possible ST depression, but not in other leads.  He had nitro which caused a severe headache and morphine which relieved his pain.  When I saw him, the chest pain was coming back.   Review of Systems: As per HPI otherwise 10 point review of systems negative.    Past Medical History:  Diagnosis Date  . CAD S/P percutaneous coronary angioplasty 07/2010; 07/2012; 08/2014   1) 2/'11: MI - 100% RCA - PCI (3 Taxus ION DES 3.0 x 28, 3.0 x 24 & 3.5 x 12  distal-prox); b) ISR of RCA stent - -AnngioSculpt PTCA; c) NSTEMI 1/'16: mild ISR with Thrombosis of RCA Stents (in setting of Mesenteric Vein Thrombosis) - Cutting Balloon PTCA; c. Re-look Cath 07/2015 & 07/2016: ~15% ISR in RCA,20% p-mLAD & o-pCx.  . Coronary stent thrombosis 08/15/2014  . Essential hypertension 07/07/2012  . Finding of multiple premature atrial contractions by electrocardiography    Symptomatic palpitations ; worse with poor sleep and fatigue along with dehydration.   . Hyperlipidemia with target LDL less than 70 09/01/2014  . Mesenteric vein thrombosis   . Migraines    "maybe one/yr" (07/06/2012)  . NSTEMI (non-ST elevated myocardial infarction) ([place]) 07/2010, 08/2014   And unstable angina in February 20 13th; Echo 1/12/'16: Moderate concentric LVH. EF 60-65% with no regional WMA. Gr 1 DD, otherwise normal  . Stroke ([place]) 2011; 2013   S/P cardiac cath - embolic stroke; denies residual (07/06/2012)  . 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 04/19/2013   No evidence of CVA on MRI/MRA & Dliated Eye Examination. ruptured blood vessel & not occlusion.    Past Surgical History:  Procedure Laterality Date  . BOWEL RESECTION N/A 08/28/2014   Procedure: SMALL BOWEL RESECTION;  Surgeon: [name], MD;  Location: [facility];  Service: General;  Laterality: N/A;  . CARDIAC CATHETERIZATION  04/18/2013  . CARDIAC CATHETERIZATION N/A 07/20/2015   Procedure: Left Heart Cath and Coronary Angiography;  Surgeon: [name], MD;  Location: [facility];  Service: Cardiovascular;  15% ISR in the RCA stent segment, 20% proximal-mid LAD as well as ostial and proximal circumflex. Normal LV function.  Marland Kitchen CARDIAC CATHETERIZATION N/A 07/23/2016   Procedure: Left Heart Cath and Coronary Angiography;  Surgeon: [name], MD;  Location: [facility];  Service: Cardiovascular: E 55-65%.  ~15% diffuse RCA ISR, ~20% diffuse pLAD & pCx  . CORONARY ANGIOPLASTY  07/06/2012; 08/15/2014   a) 12/'13: PTCA of 80% ISR mRCA - AngioSculpt; b) PTCA of  ISR/thrombosis  . CORONARY ANGIOPLASTY WITH STENT PLACEMENT  07/2010   inferior wall MI - 3 Taxus Ion DES (3.0x50mm, 3.0x53mm, 3.5x18mm) to prox and mid RCA  . LAPAROSCOPIC APPENDECTOMY N/A 08/28/2014   Procedure: APPENDECTOMY LAPAROSCOPIC;  Surgeon: [name], MD;  Location: [facility];  Service: General;  Laterality: N/A;  . LAPAROSCOPY  08/28/2014   Procedure: LAPAROSCOPY DIAGNOSTIC;  Surgeon: [name], MD;  Location: [facility];  Service: General;;  . LEFT HEART CATHETERIZATION WITH CORONARY ANGIOGRAM N/A 06/11/2012   Procedure: LEFT HEART CATHETERIZATION WITH CORONARY ANGIOGRAM;  Surgeon: [name], MD;  Location: [facility] CATH [facility];  Service: Cardiovascular;  Laterality: N/A;  . LEFT HEART CATHETERIZATION WITH CORONARY ANGIOGRAM N/A 04/18/2013   Procedure: LEFT HEART CATHETERIZATION WITH CORONARY ANGIOGRAM;  Surgeon: [name], MD;  Location: [facility] CATH LAB;  Service: Cardiovascular;  Laterality: N/A;  . LEFT HEART CATHETERIZATION WITH CORONARY ANGIOGRAM N/A 08/15/2014   Procedure: LEFT HEART CATHETERIZATION WITH CORONARY ANGIOGRAM;  Surgeon: [name], MD;  Location: [facility] CATH LAB;  Service: Cardiovascular;  Laterality: N/A;  . PERCUTANEOUS CORONARY STENT INTERVENTION (PCI-S) N/A 07/06/2012   Procedure: PERCUTANEOUS CORONARY STENT INTERVENTION (PCI-S);  Surgeon: [name], MD;  Location: [facility] CATH LAB;  Service: Cardiovascular;  Laterality: N/A;  . SEPTOPLASTY  2001  . TRANSTHORACIC ECHOCARDIOGRAM  08/15/2014   Moderate concentric LVH. EF 60-65% with no regional WMA. Gr 1 DD, otherwise normal   Reviewed with patient.   reports that he quit smoking about 6 years ago. His smoking use included Cigarettes. He has a 15.00 pack-year smoking history. His smokeless tobacco use includes Snuff. He reports that he drinks about 4.2 oz of  alcohol per week . He reports that he does not use drugs.  Allergies  Allergen Reactions  . Crestor [Rosuvastatin] Other (See Comments)    Severe muscle pain and nausea  . Zofran [Ondansetron Hcl] Nausea And Vomiting  . Ace Inhibitors Cough  . Lipitor [Atorvastatin] Other (See Comments)    myalgia  . Codeine Itching   Reviewed.  Family History  Problem Relation Age of Onset  . Atrial fibrillation Mother   . Mitral valve prolapse Mother   . Coronary artery disease Father        CABG, aortic aneursym,  . Heart attack Father   . Heart attack Paternal Uncle   . Heart attack Paternal Grandfather     Prior to Admission medications   Medication Sig Start Date End Date Taking? Authorizing Provider  clopidogrel (PLAVIX) 75 MG tablet TAKE 1 TABLET(75 MG) BY MOUTH DAILY WITH BREAKFAST 10/28/16  Yes [name], MD  Empagliflozin-Metformin HCl ER (SYNJARDY XR) 25-1000 MG TB24 Take 1 tablet by mouth daily.  Yes [provider]  Evolocumab (REPATHA SURECLICK) 564 MG/ML SOAJ Inject 140 mg into the skin every 14 (fourteen) days. 04/15/17  Yes [name], MD  Febuxostat (ULORIC) 80 MG TABS Take 80 mg by mouth daily.   Yes [provider]  furosemide (LASIX) 40 MG tablet TAKE 1 TABLET(40 MG) BY MOUTH TWICE DAILY 05/21/17  Yes [name], MD  ibuprofen (ADVIL,MOTRIN) 200 MG tablet Take 600 mg by mouth every 6 (six) hours as needed for headache or moderate pain.    Yes [provider]  Nebivolol HCl (BYSTOLIC) 20 MG TABS Take 1 tablet (20 mg total) by mouth every morning. Patient taking differently: Take 20 mg by mouth daily.  05/11/17  Yes [name], MD  nitroGLYCERIN (NITROSTAT) 0.4 MG SL tablet Place 1 tablet (0.4 mg total) under the tongue every 5 (five) minutes x 3 doses as needed for chest pain. 10/03/15  Yes [name], MD  Polyethyl Glycol-Propyl Glycol (SYSTANE OP) Place 1 drop into both eyes daily as needed (dry eyes).   Yes [provider]  olmesartan (BENICAR) 20 MG tablet NOT TAKING TAKE 1 TABLET(20 MG) BY MOUTH DAILY Patient not taking: Reported on 06/06/2017 01/22/17   [name], MD    Physical Exam: Vitals:   06/06/17 2100 06/06/17 2145 06/06/17 2215 06/06/17 2311  BP: (!) 154/97 (!) 143/92 (!) 143/92 (!) 144/73  Pulse: 65 (!) 56 (!) 58 62  Resp: 14 16 13 18   Temp:    97.6 F (36.4 C)  TempSrc:    Oral  SpO2: 96% 96% 98% 99%  Weight:    291 lb 14.4 oz (132.4 kg)  Height:        Constitutional: NAD, calm, comfortable, lying in bed.   Vitals:   06/06/17 2100 06/06/17 2145 06/06/17 2215 06/06/17 2311  BP: (!) 154/97 (!) 143/92 (!) 143/92 (!) 144/73  Pulse: 65 (!) 56 (!) 58 62  Resp: 14 16 13 18   Temp:    97.6 F (36.4 C)  TempSrc:    Oral  SpO2: 96% 96% 98% 99%  Weight:    291 lb 14.4 oz (132.4 kg)  Height:       Eyes: PERRL, lids and conjunctivae normal ENMT: Mucous membranes are moist. Posterior pharynx clear of any exudate or lesions. Neck: normal, supple, no masses, no thyromegaly Respiratory: clear to auscultation bilaterally, no wheezing, no crackles. Normal respiratory effort.   Cardiovascular: mildly bradycardic rate and normal rhythm, no murmurs / rubs / gallops. He has mild pitting edema in the left > right leg to the mid calf.  No redness or pain to palpation.   Abdomen: no tenderness, no masses palpated. Bowel sounds positive.  Musculoskeletal: no clubbing / cyanosis.  Normal muscle tone.  Skin: no rashes, lesions, ulcers noted on exposed skin.  Neurologic: Moving all extremities, grossly normal.  Psychiatric: Normal judgment and insight. Alert and oriented x 3. Normal mood.     Labs on Admission: I have personally reviewed following labs and imaging studies  CBC:  Recent Labs Lab 06/06/17 1439  WBC 7.3  HGB 15.4  HCT 46.1  MCV 87.3  PLT 332   Basic Metabolic Panel:  Recent Labs Lab 06/06/17 1439  NA 138  K 3.9  CL 104  CO2 27  GLUCOSE 130*  BUN 19    CREATININE 1.11  CALCIUM 9.2   GFR: Estimated Creatinine Clearance: 112.6 mL/min (by C-G formula based on SCr of 1.11  mg/dL). Liver Function Tests: No results for input(s): AST, ALT, ALKPHOS, BILITOT, PROT, ALBUMIN in the last 168 hours. No results for input(s): LIPASE, AMYLASE in the last 168 hours. No results for input(s): AMMONIA in the last 168 hours. Coagulation Profile: No results for input(s): INR, PROTIME in the last 168 hours. Cardiac Enzymes:  Recent Labs Lab 06/06/17 1726  TROPONINI <0.03   BNP (last 3 results) No results for input(s): PROBNP in the last 8760 hours. HbA1C: No results for input(s): HGBA1C in the last 72 hours. CBG: No results for input(s): GLUCAP in the last 168 hours. Lipid Profile: No results for input(s): CHOL, HDL, LDLCALC, TRIG, CHOLHDL, LDLDIRECT in the last 72 hours. Thyroid Function Tests: No results for input(s): TSH, T4TOTAL, FREET4, T3FREE, THYROIDAB in the last 72 hours. Anemia Panel: No results for input(s): VITAMINB12, FOLATE, FERRITIN, TIBC, IRON, RETICCTPCT in the last 72 hours. Urine analysis:    Component Value Date/Time   COLORURINE AMBER (A) 08/26/2014 1455   APPEARANCEUR CLEAR 08/26/2014 1455   LABSPEC 1.014 08/26/2014 1455   PHURINE 5.0 08/26/2014 1455   GLUCOSEU NEGATIVE 08/26/2014 1455   HGBUR NEGATIVE 08/26/2014 1455   BILIRUBINUR NEGATIVE 08/26/2014 1455   KETONESUR NEGATIVE 08/26/2014 1455   PROTEINUR NEGATIVE 08/26/2014 1455   UROBILINOGEN 0.2 08/26/2014 1455   NITRITE NEGATIVE 08/26/2014 1455   LEUKOCYTESUR NEGATIVE 08/26/2014 1455    Radiological Exams on Admission: Dg Chest 2 View  Result Date: 06/06/2017 CLINICAL DATA:  51 year old male with chest pain. EXAM: CHEST  2 VIEW COMPARISON:  02/13/2017 FINDINGS: The heart size and mediastinal contours are within normal limits. Both lungs are clear. The visualized skeletal structures are unremarkable. IMPRESSION: No active cardiopulmonary disease. Electronically  Signed   By: Kristopher Oppenheim M.D.   On: 06/06/2017 15:12    EKG: Independently reviewed. NSR.  V5 with possible depression.    Assessment/Plan Chest pain on exertion with h/o CAD - Story is typical - Last LHC reviewed from 12/17 showed nonobstructive CAD - Trop X 1 negative, trend - EKG reviewed, no acute ST elevations or TWI - Repeat EKG in the AM and with chest pain - Nitro and morphine for chest pain - ED discussed with Cards fellow, will only see if change in EKG or troponin bump - Discussed with patient trial of PPI to see if this helps, he is amenable - Telemetry - Continue plavix, lasix, nebivolol.  He has stopped his olmesartan at direction of his cardiologist.   Dyslipidemia, statin intol - He is on Repatha, due 11/9 - Continue    Essential hypertension - Continue nebivolol    Superior mesenteric vein thrombosis - Continue plavix  DM2 - Continue Empagliflozin-metformin on discharge.  If he stays more than one night, would start in house.      DVT prophylaxis: Lovenox Code Status: Full Disposition Plan: Admit to obs, chest pain rule out Consults called: Cards fellow, will only see if situation changes Admission status: Telemetry, obs   Gilles Chiquito MD [facility] Pager [phone]  If 7PM-7AM, please contact night-coverage [email or web address] Password [email or web address]  06/07/2017, 1:34 AM

## 2017-06-06 NOTE — ED Notes (Signed)
Attempted to give report. Was told nurse would call me back. Gave number to Network engineer.

## 2017-06-06 NOTE — ED Provider Notes (Signed)
[facility] EMERGENCY DEPARTMENT Provider Note   CSN: [id number] Arrival date & time: 06/06/17  1424     History   Chief Complaint Chief Complaint  Patient presents with  . Chest Pain    HPI Dylan [name] is a 51 y.o. male.  The history is provided by the patient. No language interpreter was used.  Chest Pain   This is a recurrent problem. Episode onset: 3 weeks. The problem occurs constantly. The problem has been gradually worsening. The pain is associated with exertion. The pain is present in the substernal region. The pain is moderate. The quality of the pain is described as pressure-like. The pain radiates to the left jaw and left neck. He has tried nothing for the symptoms. The treatment provided no relief. Risk factors include male gender.  His past medical history is significant for CAD, DVT, hyperlipidemia, hypertension and MI.  His family medical history is significant for CAD.  Procedure history is positive for cardiac catheterization and stress echo.  Patient reports he began having some chest pain 3-4 weeks ago.  Patient reports that he has intermittent chest pain however the pain that he is experiencing is similar to how he felt when he had his 2 previous MIs.  Patient reports he did not take his nitroglycerin at home because he is concerned that his blood pressure and his heart rate were too low, patient reports he has 3 stents.  He has had 2 previous MIs.  Patient reports he also had a total vein thrombosis in the past.   Past Medical History:  Diagnosis Date  . CAD S/P percutaneous coronary angioplasty 07/2010; 07/2012; 08/2014   1) 2/'11: MI - 100% RCA - PCI (3 Taxus ION DES 3.0 x 28, 3.0 x 24 & 3.5 x 12 distal-prox); b) ISR of RCA stent - -AnngioSculpt PTCA; c) NSTEMI 1/'16: mild ISR with Thrombosis of RCA Stents (in setting of Mesenteric Vein Thrombosis) - Cutting Balloon PTCA; c. Re-look Cath 07/2015 & 07/2016: ~15% ISR in RCA,20% p-mLAD &  o-pCx.  . Coronary stent thrombosis 08/15/2014  . Essential hypertension 07/07/2012  . Finding of multiple premature atrial contractions by electrocardiography    Symptomatic palpitations ; worse with poor sleep and fatigue along with dehydration.   . Hyperlipidemia with target LDL less than 70 09/01/2014  . Mesenteric vein thrombosis   . Migraines    "maybe one/yr" (07/06/2012)  . NSTEMI (non-ST elevated myocardial infarction) ([place]) 07/2010, 08/2014   And unstable angina in February 20 13th; Echo 1/12/'16: Moderate concentric LVH. EF 60-65% with no regional WMA. Gr 1 DD, otherwise normal  . Stroke ([place]) 2011; 2013   S/P cardiac cath - embolic stroke; denies residual (07/06/2012)  . 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 04/19/2013   No evidence of CVA on MRI/MRA & Dliated Eye Examination. ruptured blood vessel & not occlusion.    Patient Active Problem List   Diagnosis Date Noted  . Medication management 09/04/2016  . Hyperglycemia 07/22/2016  . Diastolic dysfunction 73/22/0254  . Finding of multiple premature atrial contractions by electrocardiography 10/05/2015  . Iron deficiency anemia 09/29/2014  . Obesity (BMI 30-39.9) 09/09/2014  . Perforation bowel - 2/2 Mesenteric Ischemia from Mesenteric Vein Thrombosis.   . Intestinal perforation ([place]) 08/26/2014  . Coronary stent thrombosis 08/15/2014  . Acute embolism and thrombosis of vein   . Mesenteric vein thrombosis   .  Superior mesenteric vein thrombosis ([place]) 08/05/2014  . Portal vein thrombosis 08/05/2014  . 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 04/19/2013  . Possible Retinal artery branch occlusion of right eye = POST CATHETERIZATION -- by dilated Eye exam - no evidence of embolic occlusoin; possible microperforation. NOT CVA 04/19/2013     Class: Acute  . Hypertriglyceridemia - TG >500 04/19/2013  . Dyslipidemia, statin intol 07/07/2012  . Essential hypertension 07/07/2012  . Smoker, quit 06/30/10 07/07/2012  . CAD S/P PCI with 3 DES stents to RCA and PTCA x2 for ISR and thrombosis  07/06/2012  . H/O: CVA (cerebrovascular accident), post cardiac cath, minimal speech residual 11/11 post PCI 07/06/2012  . NSTEMI (non-ST elevated myocardial infarction) - 07/2010, 2013, 08/2014 07/04/2010    Past Surgical History:  Procedure Laterality Date  . BOWEL RESECTION N/A 08/28/2014   Procedure: SMALL BOWEL RESECTION;  Surgeon: [name], MD;  Location: [facility];  Service: General;  Laterality: N/A;  . CARDIAC CATHETERIZATION  04/18/2013  . CARDIAC CATHETERIZATION N/A 07/20/2015   Procedure: Left Heart Cath and Coronary Angiography;  Surgeon: [name], MD;  Location: [facility] CV LAB;  Service: Cardiovascular;  15% ISR in the RCA stent segment, 20% proximal-mid LAD as well as ostial and proximal circumflex. Normal LV function.  Marland Kitchen CARDIAC CATHETERIZATION N/A 07/23/2016   Procedure: Left Heart Cath and Coronary Angiography;  Surgeon: [name], MD;  Location: [facility] CV LAB;  Service: Cardiovascular: E 55-65%. ~15% diffuse RCA ISR, ~20% diffuse pLAD & pCx  . CORONARY ANGIOPLASTY  07/06/2012; 08/15/2014   a) 12/'13: PTCA of 80% ISR mRCA - AngioSculpt; b) PTCA of  ISR/thrombosis  . CORONARY ANGIOPLASTY WITH STENT PLACEMENT  07/2010   inferior wall MI - 3 Taxus Ion DES (3.0x58mm, 3.0x59mm, 3.5x18mm) to prox and mid RCA  . LAPAROSCOPIC APPENDECTOMY N/A 08/28/2014   Procedure: APPENDECTOMY LAPAROSCOPIC;  Surgeon: [name], MD;  Location: [facility];  Service: General;  Laterality: N/A;  . LAPAROSCOPY  08/28/2014   Procedure: LAPAROSCOPY DIAGNOSTIC;  Surgeon: [name], MD;  Location: [facility];  Service: General;;  . LEFT HEART CATHETERIZATION WITH CORONARY ANGIOGRAM N/A 06/11/2012   Procedure: LEFT HEART CATHETERIZATION  WITH CORONARY ANGIOGRAM;  Surgeon: [name], MD;  Location: [facility] CATH LAB;  Service: Cardiovascular;  Laterality: N/A;  . LEFT HEART CATHETERIZATION WITH CORONARY ANGIOGRAM N/A 04/18/2013   Procedure: LEFT HEART CATHETERIZATION WITH CORONARY ANGIOGRAM;  Surgeon: [name], MD;  Location: [facility] CATH LAB;  Service: Cardiovascular;  Laterality: N/A;  . LEFT HEART CATHETERIZATION WITH CORONARY ANGIOGRAM N/A 08/15/2014   Procedure: LEFT HEART CATHETERIZATION WITH CORONARY ANGIOGRAM;  Surgeon: [name], MD;  Location: [facility] C[facility] LAB;  Service: Cardiovascular;  Laterality: N/A;  . PERCUTANEOUS CORONARY STENT INTERVENTION (PCI-S) N/A 07/06/2012   Procedure: PERCUTANEOUS CORONARY STENT INTERVENTION (PCI-S);  Surgeon: [name], MD;  Location: [facility] CATH LAB;  Service: Cardiovascular;  Laterality: N/A;  . SEPTOPLASTY  2001  . TRANSTHORACIC ECHOCARDIOGRAM  08/15/2014   Moderate concentric LVH. EF 60-65% with no regional WMA. Gr 1 DD, otherwise normal       Home Medications    Prior to Admission medications   Medication Sig Start Date End Date Taking? Authorizing Provider  clopidogrel (PLAVIX) 75 MG tablet TAKE 1 TABLET(75 MG) BY MOUTH DAILY WITH BREAKFAST 10/28/16  Yes [name], MD  Empagliflozin-Metformin HCl ER (  SYNJARDY XR) 25-1000 MG TB24 Take 1 tablet by mouth daily.   Yes [provider]  Evolocumab (REPATHA SURECLICK) 951 MG/ML SOAJ Inject 140 mg into the skin every 14 (fourteen) days. 04/15/17  Yes [name], MD  Febuxostat (ULORIC) 80 MG TABS Take 80 mg by mouth daily.   Yes [provider]  furosemide (LASIX) 40 MG tablet TAKE 1 TABLET(40 MG) BY MOUTH TWICE DAILY 05/21/17  Yes [name], MD  ibuprofen (ADVIL,MOTRIN) 200 MG tablet Take 600 mg by mouth every 6 (six) hours as needed for headache or moderate pain.    Yes [provider]  Nebivolol HCl (BYSTOLIC) 20 MG TABS Take 1 tablet (20 mg total) by mouth every morning. Patient taking  differently: Take 20 mg by mouth daily.  05/11/17  Yes [name], MD  nitroGLYCERIN (NITROSTAT) 0.4 MG SL tablet Place 1 tablet (0.4 mg total) under the tongue every 5 (five) minutes x 3 doses as needed for chest pain. 10/03/15  Yes [name], MD  Polyethyl Glycol-Propyl Glycol (SYSTANE OP) Place 1 drop into both eyes daily as needed (dry eyes).   Yes [provider]  olmesartan (BENICAR) 20 MG tablet TAKE 1 TABLET(20 MG) BY MOUTH DAILY Patient not taking: Reported on 06/06/2017 01/22/17   [name], MD    Family History Family History  Problem Relation Age of Onset  . Atrial fibrillation Mother   . Mitral valve prolapse Mother   . Coronary artery disease Father        CABG, aortic aneursym,  . Heart attack Father   . Heart attack Paternal Uncle   . Heart attack Paternal Grandfather     Social History Social History  Substance Use Topics  . Smoking status: Former Smoker    Packs/day: 1.00    Years: 15.00    Types: Cigarettes    Quit date: 06/30/2010  . Smokeless tobacco: Current User    Types: Snuff    Last attempt to quit: 06/30/2010  . Alcohol use 4.2 oz/week    7 Shots of liquor per week     Comment: a  glass of scotch per day     Allergies   Crestor [rosuvastatin]; Zofran [ondansetron hcl]; Ace inhibitors; Lipitor [atorvastatin]; and Codeine   Review of Systems Review of Systems  Cardiovascular: Positive for chest pain.  All other systems reviewed and are negative.    Physical Exam Updated Vital Signs BP 126/77   Pulse (!) 59   Temp 98.4 F (36.9 C) (Oral)   Resp 16   Ht 6\' 2"  (1.88 m)   Wt 129.3 kg (285 lb)   SpO2 96%   BMI 36.59 kg/m   Physical Exam  Constitutional: He appears well-developed and well-nourished.  HENT:  Head: Normocephalic and atraumatic.  Right Ear: External ear normal.  Left Ear: External ear normal.  Mouth/Throat: Oropharynx is clear and moist.  Eyes: Conjunctivae are normal.  Neck: Neck supple.    Cardiovascular: Normal rate and regular rhythm.   No murmur heard. Pulmonary/Chest: Effort normal and breath sounds normal. No respiratory distress.  Abdominal: Soft. There is no tenderness.  Musculoskeletal: Normal range of motion. He exhibits no edema.  Neurological: He is alert.  Skin: Skin is warm and dry.  Psychiatric: He has a normal mood and affect.  Nursing note and vitals reviewed.    ED Treatments / Results  Labs (all labs ordered are listed, but only abnormal results are displayed) Labs Reviewed  BASIC METABOLIC PANEL - Abnormal; Notable for the following:       Result Value   Glucose, Bld 130 (*)    All other components within normal limits  CBC  TROPONIN I  I-STAT TROPONIN, ED    EKG  EKG Interpretation  Date/Time:  Saturday June 06 2017 14:32:51 EDT Ventricular Rate:  69 PR Interval:  144 QRS Duration: 86 QT Interval:  396 QTC Calculation: 424 R Axis:   39 Text Interpretation:  Normal sinus rhythm Cannot rule out Anterior infarct , age undetermined Abnormal ECG No significant change since last tracing Confirmed by Duffy Bruce [phone]) on 06/06/2017 4:49:46 PM       Radiology Dg Chest 2 View  Result Date: 06/06/2017 CLINICAL DATA:  50 year old male with chest pain. EXAM: CHEST  2 VIEW COMPARISON:  02/13/2017 FINDINGS: The heart size and mediastinal contours are within normal limits. Both lungs are clear. The visualized skeletal structures are unremarkable. IMPRESSION: No active cardiopulmonary disease. Electronically Signed   By: Kristopher Oppenheim M.D.   On: 06/06/2017 15:12    Procedures Procedures (including critical care time)  Medications Ordered in ED Medications  nitroGLYCERIN (NITROSTAT) SL tablet 0.4 mg (0.4 mg Sublingual Given 06/06/17 1802)  acetaminophen (TYLENOL) tablet 650 mg (650 mg Oral Given 06/06/17 1800)  morphine 4 MG/ML injection 4 mg (4 mg Intravenous Given 06/06/17 1821)   Pt reports severe headache with nitro.  Pt given  tylenol and morpine for headache and chest pain  Initial Impression / Assessment and Plan / ED Course  I have reviewed the triage vital signs and the nursing notes.  Pertinent labs & imaging results that were available during my care of the patient were reviewed by me and considered in my medical decision making (see chart for details).     I spoke to Dr. Therisa Doyne Cardiology.  He advised given pt's significant vascular disease in the past. He would have medicine admit fore observation and rule out.  He advised consult if needed. He feels if pt rules out he does not need stress test or cath.   Final Clinical Impressions(s) / ED Diagnoses   Final diagnoses:  Chest pain, unspecified type    New Prescriptions New Prescriptions   No medications on file   I spoke to [facility] who will admit.   Sidney Ace 06/06/17 2053    Duffy Bruce, MD 06/07/17 [phone]

## 2017-06-07 ENCOUNTER — Other Ambulatory Visit: Payer: Self-pay

## 2017-06-07 ENCOUNTER — Encounter (HOSPITAL_COMMUNITY): Payer: Self-pay | Admitting: Family Medicine

## 2017-06-07 DIAGNOSIS — I10 Essential (primary) hypertension: Secondary | ICD-10-CM

## 2017-06-07 DIAGNOSIS — E785 Hyperlipidemia, unspecified: Secondary | ICD-10-CM | POA: Diagnosis not present

## 2017-06-07 DIAGNOSIS — E119 Type 2 diabetes mellitus without complications: Secondary | ICD-10-CM

## 2017-06-07 DIAGNOSIS — E781 Pure hyperglyceridemia: Secondary | ICD-10-CM | POA: Diagnosis not present

## 2017-06-07 DIAGNOSIS — I2510 Atherosclerotic heart disease of native coronary artery without angina pectoris: Secondary | ICD-10-CM

## 2017-06-07 DIAGNOSIS — R0789 Other chest pain: Secondary | ICD-10-CM | POA: Diagnosis not present

## 2017-06-07 DIAGNOSIS — E118 Type 2 diabetes mellitus with unspecified complications: Secondary | ICD-10-CM

## 2017-06-07 DIAGNOSIS — R079 Chest pain, unspecified: Secondary | ICD-10-CM | POA: Diagnosis not present

## 2017-06-07 HISTORY — DX: Type 2 diabetes mellitus without complications: E11.9

## 2017-06-07 LAB — TROPONIN I
Troponin I: <0.03 ng/mL (ref <0.03)
Troponin I: <0.03 ng/mL (ref <0.03)

## 2017-06-07 LAB — HIV ANTIBODY (ROUTINE TESTING W REFLEX): HIV Screen 4th Generation wRfx: NONREACTIVE

## 2017-06-07 MED ORDER — ASPIRIN 81 MG PO CHEW
324.0000 mg | CHEWABLE_TABLET | Freq: Once | ORAL | Status: AC
  Administered 2017-06-07: 324 mg via ORAL
  Filled 2017-06-07: qty 4

## 2017-06-07 NOTE — Progress Notes (Signed)
  PROGRESS NOTE  Dylan [name] BPZ:[id number] DOB: [birth year] DOA: 06/06/2017 PCP: [name], PA  Brief Narrative: 44yom PMH CAD, MI, s/p PCA with stent, presented with intermittent CP x1 month. Admitted for CP evaluation.  Assessment/Plan Chest pain with typical and atypical features, known CAD s/p PCA 2011. LHC 12/17 >> nonobstructive CAD.  - troponins negative, EKG nonacute but ongoing pain with DOE - continue clopidogrel, nebivolol, olmesartan. Give ASA. - consult cardiology for further recommendations  Essential HTN - stable. Continue BB, ARB  Hyperlipidemia  - Repatha  DM type 2 - hold metformin - SSI   DVT prophylaxis: enoxaparin Code Status: full Family Communication: none Disposition Plan: home    Murray Hodgkins, MD  [facility] Direct contact: [phone] --[place]ia [place]  --[email or web address]; password [email or web address]  7PM-7AM contact night coverage as above 06/07/2017, 10:57 AM  LOS: 0 days   Consultants:  Cardiology   Procedures:    Antimicrobials:    Interval history/Subjective: Continue CP all night per RN.  Reports ongoing chest pain, 5/10, just left of mid-sternum, pressure-like with radiation to neck and jaw. No arm pain. No n/v. Reports some SOB. Hungry and requests food.  Objective: Vitals:  Vitals:   06/07/17 0333 06/07/17 0749  BP: 109/64 (!) 141/85  Pulse: 60 (!) 55  Resp: 18   Temp: 98.2 F (36.8 C)   SpO2: 93%     Exam:  Constitutional:  . Appears calm and comfortable. Smiles. Appears well. Eyes:  . pupils and irises appear normal . Normal lids ENMT:  . grossly normal hearing  . Lips appear normal Respiratory:  . CTA bilaterally, no w/r/r.  . Respiratory effort normal. No retractions or accessory muscle use Cardiovascular:  . RRR, no m/r/g . No LE extremity edema   Musculoskeletal:  . Digits/nails BUE: no clubbing, cyanosis, petechiae, infection Skin:  . No rashes, lesions, ulcers . palpation of skin: no  induration or nodules Psychiatric:  . judgement and insight appear normal . Mental status o Mood, affect appropriate   I have personally reviewed the following:  Filed Weights   06/06/17 1807 06/06/17 2311 06/07/17 0421  Weight: 129.3 kg (285 lb) 132.4 kg (291 lb 14.4 oz) 132 kg (291 lb 1.6 oz)   Weight change:   UOP: 580 I/O:  Last BM charted:  Foley: no Telemetry: SR  Labs:  Troponins negative  BMP and CBC unremarkable  Imaging studies:  CXR independently reviewed, NAD  Medical tests:  EKGs independently reivewed: 1432 SR no acute changes. 0357 SB, no acute changes.   Test discussed with performing physician:    Decision to obtain old records:    Review and summation of old records:    Scheduled Meds: . clopidogrel  75 mg Oral Daily  . enoxaparin (LOVENOX) injection  40 mg Subcutaneous QHS  . febuxostat  80 mg Oral Daily  . furosemide  40 mg Oral BID  . nebivolol  20 mg Oral Daily  . pantoprazole  40 mg Oral Daily   Continuous Infusions:  Principal Problem:   Chest pain on exertion Active Problems:   Dyslipidemia, statin intol   Essential hypertension   DM type 2 (diabetes mellitus, type 2) ([place])   LOS: 0 days

## 2017-06-07 NOTE — Consult Note (Signed)
Cardiology Consult    Patient ID: Dylan [name] MRN: [id number], DOB/AGE: [birth year]   Admit date: 06/06/2017 Date of Consult: 06/07/2017  Primary Physician: [name], [place] Primary Cardiologist: Ellyn Hack Requesting Provider: Sarajane Jews Reason for Consultation: Chest pain  Dylan [name] is a 51 y.o. male who is being seen today for the evaluation of chest pain at the request of Dr. Sarajane Jews.   Patient Profile    51 yo male with PMH of CAD s/p full metal jacket RCA, HTN, HL, DM who presented with chest pain.   Past Medical History   Past Medical History:  Diagnosis Date  . CAD S/P percutaneous coronary angioplasty 07/2010; 07/2012; 08/2014   1) 2/'11: MI - 100% RCA - PCI (3 Taxus ION DES 3.0 x 28, 3.0 x 24 & 3.5 x 12 distal-prox); b) ISR of RCA stent - -AnngioSculpt PTCA; c) NSTEMI 1/'16: mild ISR with Thrombosis of RCA Stents (in setting of Mesenteric Vein Thrombosis) - Cutting Balloon PTCA; c. Re-look Cath 07/2015 & 07/2016: ~15% ISR in RCA,20% p-mLAD & o-pCx.  . Coronary stent thrombosis 08/15/2014  . DM type 2 (diabetes mellitus, type 2) ([place]) 06/07/2017  . Essential hypertension 07/07/2012  . Finding of multiple premature atrial contractions by electrocardiography    Symptomatic palpitations ; worse with poor sleep and fatigue along with dehydration.   . Hyperlipidemia with target LDL less than 70 09/01/2014  . Mesenteric vein thrombosis   . Migraines    "maybe one/yr" (07/06/2012)  . NSTEMI (non-ST elevated myocardial infarction) ([place]) 07/2010, 08/2014   And unstable angina in February 20 13th; Echo 1/12/'16: Moderate concentric LVH. EF 60-65% with no regional WMA. Gr 1 DD, otherwise normal  . Stroke ([place]) 2011; 2013   S/P cardiac cath - embolic stroke; denies residual (07/06/2012)  . 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 04/19/2013   No evidence of CVA on MRI/MRA &  Dliated Eye Examination. ruptured blood vessel & not occlusion.    Past Surgical History:  Procedure Laterality Date  . CARDIAC CATHETERIZATION  04/18/2013  . CORONARY ANGIOPLASTY  07/06/2012; 08/15/2014   a) 12/'13: PTCA of 80% ISR mRCA - AngioSculpt; b) PTCA of  ISR/thrombosis  . CORONARY ANGIOPLASTY WITH STENT PLACEMENT  07/2010   inferior wall MI - 3 Taxus Ion DES (3.0x67mm, 3.0x49mm, 3.5x51mm) to prox and mid RCA  . SEPTOPLASTY  2001  . TRANSTHORACIC ECHOCARDIOGRAM  08/15/2014   Moderate concentric LVH. EF 60-65% with no regional WMA. Gr 1 DD, otherwise normal     Allergies  Allergies  Allergen Reactions  . Crestor [Rosuvastatin] Other (See Comments)    Severe muscle pain and nausea  . Zofran [Ondansetron Hcl] Nausea And Vomiting  . Ace Inhibitors Cough  . Lipitor [Atorvastatin] Other (See Comments)    myalgia  . Codeine Itching    History of Present Illness    Dylan [name] is a 51 yo male with PMH of CAD s/p full metal jacket RCA, HTN, HL, DM. He is followed by Dr. Ellyn Hack in the outpatient setting. Presented in 2011 with NSTEMI with occluded RCA. Again in 2013 with unstable angina with ISR in the RCA with PTCA. January 2016 cathed with thrombotic ISR in the RCA with PTCA and antithrombotic therapy. Last cath was 12/17 that showed stable nonobstructive disease with patent stents. Admitted 7/18 with chest pain and palpitations, ruled out for MI.   Was  last seen in the office on 9/18 and reported doing well. Did report palpitations and increased fatigue, but stated metoprolol was helping with this. He has been maintained on plavix. Also on Repatha with last LDL 43.    Reports he was in his usual state of health until Friday evening. He was at [employer] and developed exertional chest pressure and dyspnea while walking through the woods. Had to stop several times and rest. Symptoms have persisted through Saturday prompting him to come to the ED. Of note reports his blood pressure  was in the 02O systolic on Friday evening.   In the ED in the his labs showed stable electrolytes, Trop neg x5, Hgb 15.4. He continues to have ongoing chest pressure, states "feels like my heart is bruised". EKG with SB, no acute ST/Twave changes.   Inpatient Medications    . clopidogrel  75 mg Oral Daily  . enoxaparin (LOVENOX) injection  40 mg Subcutaneous QHS  . febuxostat  80 mg Oral Daily  . furosemide  40 mg Oral BID  . nebivolol  20 mg Oral Daily  . pantoprazole  40 mg Oral Daily    Family History    Family History  Problem Relation Age of Onset  . Atrial fibrillation Mother   . Mitral valve prolapse Mother   . Coronary artery disease Father        CABG, aortic aneursym,  . Heart attack Father   . Heart attack Paternal Uncle   . Heart attack Paternal Grandfather     Social History    Social History   Socioeconomic History  . Marital status: Married    Spouse name: Not on file  . Number of children: 2  . Years of education: 54  . Highest education level: Not on file  Social Needs  . Financial resource strain: Not on file  . Food insecurity - worry: Not on file  . Food insecurity - inability: Not on file  . Transportation needs - medical: Not on file  . Transportation needs - non-medical: Not on file  Occupational History  . Occupation: Pharmacologist: [employer]  Tobacco Use  . Smoking status: Former Smoker    Packs/day: 1.00    Years: 15.00    Pack years: 15.00    Types: Cigarettes    Last attempt to quit: 06/30/2010    Years since quitting: 6.9  . Smokeless tobacco: Current User    Types: Snuff    Last attempt to quit: 06/30/2010  Substance and Sexual Activity  . Alcohol use: Yes    Alcohol/week: 4.2 oz    Types: 7 Shots of liquor per week    Comment: a  glass of scotch per day  . Drug use: No  . Sexual activity: Yes  Other Topics Concern  . Not on file  Social History Narrative  . Not on file     Review of Systems    See  HPI  All other systems reviewed and are otherwise negative except as noted above.  Physical Exam    Blood pressure 136/77, pulse (!) 57, temperature 98.2 F (36.8 C), temperature source Oral, resp. rate 20, height 6\' 2"  (1.88 m), weight 291 lb 1.6 oz (132 kg), SpO2 93 %.  General: Pleasant, obese, NAD Psych: Normal affect. Neuro: Alert and oriented X 3. Moves all extremities spontaneously. HEENT: Normal  Neck: Supple without bruits or JVD. Lungs:  Resp regular and unlabored, CTA. Heart: RRR no  s3, s4, or murmurs. Abdomen: Soft, non-tender, non-distended, BS + x 4.  Extremities: No clubbing, cyanosis or edema. DP/PT/Radials 2+ and equal bilaterally.  Labs    Troponin [facility] Test) Recent Labs    06/06/17 1457  TROPIPOC 0.01   Recent Labs    06/06/17 1726 06/07/17 0047 06/07/17 0410 06/07/17 0645  TROPONINI <0.03 <0.03 <0.03 <0.03   Lab Results  Component Value Date   WBC 7.3 06/06/2017   HGB 15.4 06/06/2017   HCT 46.1 06/06/2017   MCV 87.3 06/06/2017   PLT 212 06/06/2017    Recent Labs  Lab 06/06/17 1439  NA 138  K 3.9  CL 104  CO2 27  BUN 19  CREATININE 1.11  CALCIUM 9.2  GLUCOSE 130*   Lab Results  Component Value Date   CHOL 94 10/03/2016   HDL 29 (L) 10/03/2016   LDLCALC 17 10/03/2016   TRIG 238 (H) 10/03/2016   Lab Results  Component Value Date   DDIMER  07/01/2010    <0.22        AT THE INHOUSE ESTABLISHED CUTOFF VALUE OF 0.48 ug/mL FEU, THIS ASSAY HAS BEEN DOCUMENTED IN THE LITERATURE TO HAVE A SENSITIVITY AND NEGATIVE PREDICTIVE VALUE OF AT LEAST 98 TO 99%.  THE TEST RESULT SHOULD BE CORRELATED WITH AN ASSESSMENT OF THE CLINICAL PROBABILITY OF DVT / VTE.     Radiology Studies    Dg Chest 2 View  Result Date: 06/06/2017 CLINICAL DATA:  51 year old male with chest pain. EXAM: CHEST  2 VIEW COMPARISON:  02/13/2017 FINDINGS: The heart size and mediastinal contours are within normal limits. Both lungs are clear. The visualized  skeletal structures are unremarkable. IMPRESSION: No active cardiopulmonary disease. Electronically Signed   By: Kristopher Oppenheim M.D.   On: 06/06/2017 15:12    ECG & Cardiac Imaging    EKG: SB  Echo: 1/16  Study Conclusions  - Left ventricle: The cavity size was normal. There was moderate concentric hypertrophy. Systolic function was normal. The estimated ejection fraction was in the range of 60% to 65%. Wall motion was normal; there were no regional wall motion abnormalities. Doppler parameters are consistent with abnormal left ventricular relaxation (grade 1 diastolic dysfunction). There was no evidence of elevated ventricular filling pressure by Doppler parameters. - Aortic valve: Structurally normal valve. There was no regurgitation. - Aorta: The aorta was normal, not dilated, and non-diseased. - Ascending aorta: The ascending aorta was normal in size. - Left atrium: The atrium was normal in size. - Right ventricle: Systolic function was normal. - Right atrium: The atrium was normal in size. - Tricuspid valve: There was trivial regurgitation. - Pulmonic valve: There was mild regurgitation. - Pericardium, extracardiac: The pericardium was normal in appearance.  Assessment & Plan    51 yo male with PMH of CAD s/p full metal jacket RCA, HTN, HL, DM who presented with chest pain.   1. Chest pain: Reports symptoms starting while walking through [place] Friday night. Had to stop several times and rest. Symptoms have continued throughout Saturday, prompting him to come to the ED. Trop neg x5. EKG non-acute. Given duration of symptoms with negative enzymes would opt for Lexiscan in the am.  -- NPO at midnight  2. CAD s/p full metal jacket RCA: Several caths over the years with ISR to the RCA. Last cath 12/17 showed patent stents. Lifelong plavix. On good medical therapy  3. HTN: Stable  4. HL: on Repatha, last LDL 43  5. DM: Hgb A1c 6.1  12/17  Barnet Pall, NP-C Pager [phone] 06/07/2017, 1:30 PM

## 2017-06-08 ENCOUNTER — Observation Stay (HOSPITAL_BASED_OUTPATIENT_CLINIC_OR_DEPARTMENT_OTHER): Payer: BLUE CROSS/BLUE SHIELD

## 2017-06-08 DIAGNOSIS — I2510 Atherosclerotic heart disease of native coronary artery without angina pectoris: Secondary | ICD-10-CM

## 2017-06-08 DIAGNOSIS — R079 Chest pain, unspecified: Secondary | ICD-10-CM | POA: Diagnosis not present

## 2017-06-08 DIAGNOSIS — E785 Hyperlipidemia, unspecified: Secondary | ICD-10-CM | POA: Diagnosis not present

## 2017-06-08 DIAGNOSIS — I10 Essential (primary) hypertension: Secondary | ICD-10-CM | POA: Diagnosis not present

## 2017-06-08 DIAGNOSIS — E118 Type 2 diabetes mellitus with unspecified complications: Secondary | ICD-10-CM | POA: Diagnosis not present

## 2017-06-08 LAB — LIPID PANEL
Cholesterol: 88 mg/dL (ref 0–200)
HDL: 34 mg/dL — ABNORMAL LOW (ref >40)
LDL CALC: NEGATIVE mg/dL (ref 0–99)
TRIGLYCERIDES: 308 mg/dL — AB (ref <150)
Total CHOL/HDL Ratio: 2.6 RATIO
VLDL: 62 mg/dL — ABNORMAL HIGH (ref 0–40)

## 2017-06-08 LAB — NM MYOCAR MULTI W/SPECT W/WALL MOTION / EF
CHL CUP MPHR: 150 {beats}/min
CHL CUP RESTING HR STRESS: 63 {beats}/min
CSEPEW: 1 METS
CSEPHR: 53 %
CSEPPHR: 90 {beats}/min
Exercise duration (min): 0 min
Exercise duration (sec): 0 s
RPE: 0

## 2017-06-08 MED ORDER — TECHNETIUM TC 99M TETROFOSMIN IV KIT
30.0000 | PACK | Freq: Once | INTRAVENOUS | Status: AC | PRN
  Administered 2017-06-08: 30 via INTRAVENOUS

## 2017-06-08 MED ORDER — REGADENOSON 0.4 MG/5ML IV SOLN
0.4000 mg | Freq: Once | INTRAVENOUS | Status: AC
  Administered 2017-06-08: 0.4 mg via INTRAVENOUS
  Filled 2017-06-08: qty 5

## 2017-06-08 MED ORDER — TECHNETIUM TC 99M TETROFOSMIN IV KIT
10.0000 | PACK | Freq: Once | INTRAVENOUS | Status: AC | PRN
  Administered 2017-06-08: 10 via INTRAVENOUS

## 2017-06-08 MED ORDER — REGADENOSON 0.4 MG/5ML IV SOLN
INTRAVENOUS | Status: AC
  Administered 2017-06-08: 0.4 mg via INTRAVENOUS
  Filled 2017-06-08: qty 5

## 2017-06-08 NOTE — Discharge Summary (Signed)
Physician Discharge Summary  Dylan [name] JJH:[id number] DOB: [birth year] DOA: 06/06/2017  PCP: [name], PA  Admit date: 06/06/2017 Discharge date: 06/08/2017  Recommendations for Outpatient Follow-up:  1. Consider outpatient echo 2. F/u chest pain   Follow-up Information    Erma Heritage, PA-C Follow up.   Specialties:  Physician Assistant, Cardiology Why:  On November 26th at 9:00 for cardiology hospital follow up.  Contact information: [address] [place] [address] [phone]        [name], MD. Schedule an appointment as soon as possible for a visit in 1 week(s).   Specialty:  Cardiology Contact information: [address] [place] [address] [phone]            Discharge Diagnoses:  1. Chest pain, atypical 2. Essential HTN 3. Hyperlipidemia  4. DM type 2  Discharge Condition: improved Disposition: home  Diet recommendation: heart healthy, diabetic diet  Filed Weights   06/06/17 2311 06/07/17 0421 06/08/17 0522  Weight: 132.4 kg (291 lb 14.4 oz) 132 kg (291 lb 1.6 oz) 131.8 kg (290 lb 9.6 oz)    History of present illness:  51yom PMH CAD, MI, s/p PCA with stent, presented with intermittent CP x1 month. Admitted for CP evaluation.  Hospital Course:  Patient reported ongoing chest pain, however labs and EKG were unrevealing. ACS was ruled out and patient was seen by cardiology. He underwent NM stress which was read as normal with normal EF although echo was recommended. Cleared by cardiology for discharge.  Chest pain with typical and atypical features, known CAD s/p PCA 2011. LHC 12/17 >> nonobstructive CAD. Troponins negative, EKG nonacute - pain ongoing without relief. Appears calm and comfortable  - continue clopidogrel, nebivolol, olmesartan.   - nuclear stress was negative - Wells 0. No hx to suggest VTE.  Essential HTN - remained stable. Continue BB, ARB  Hyperlipidemia  -  on Repatha. LDL 8.  DM type 2 - resume metformin on d/c - SSI    Consultants:  Cardiology    Discharge Instructions  Discharge Instructions    Activity as tolerated - No restrictions   Complete by:  As directed    Diet - low sodium heart healthy   Complete by:  As directed    Diet Carb Modified   Complete by:  As directed    Discharge instructions   Complete by:  As directed    Call your physician or seek immediate medical attention for chest pain, shortness of breath or worsening of condition.     Allergies as of 06/08/2017      Reactions   Crestor [rosuvastatin] Other (See Comments)   Severe muscle pain and nausea   Zofran [ondansetron Hcl] Nausea And Vomiting   Ace Inhibitors Cough   Lipitor [atorvastatin] Other (See Comments)   myalgia   Codeine Itching      Medication List    TAKE these medications   clopidogrel 75 MG tablet Commonly known as:  PLAVIX TAKE 1 TABLET(75 MG) BY MOUTH DAILY WITH BREAKFAST   Evolocumab 140 MG/ML Soaj Commonly known as:  REPATHA SURECLICK Inject 027 mg into the skin every 14 (fourteen) days.   furosemide 40 MG tablet Commonly known as:  LASIX TAKE 1 TABLET(40 MG) BY MOUTH TWICE DAILY   ibuprofen 200 MG tablet Commonly known as:  ADVIL,MOTRIN Take 600 mg by mouth every 6 (six) hours as needed for headache or moderate pain.   Nebivolol HCl 20  MG Tabs Commonly known as:  BYSTOLIC Take 1 tablet (20 mg total) by mouth every morning. What changed:  when to take this   nitroGLYCERIN 0.4 MG SL tablet Commonly known as:  NITROSTAT Place 1 tablet (0.4 mg total) under the tongue every 5 (five) minutes x 3 doses as needed for chest pain.   olmesartan 20 MG tablet Commonly known as:  BENICAR TAKE 1 TABLET(20 MG) BY MOUTH DAILY   SYNJARDY XR 25-1000 MG Tb24 Generic drug:  Empagliflozin-Metformin HCl ER Take 1 tablet by mouth daily.   SYSTANE OP Place 1 drop into both eyes daily as needed (dry eyes).   ULORIC 80 MG  Tabs Generic drug:  Febuxostat Take 80 mg by mouth daily.      Allergies  Allergen Reactions  . Crestor [Rosuvastatin] Other (See Comments)    Severe muscle pain and nausea  . Zofran [Ondansetron Hcl] Nausea And Vomiting  . Ace Inhibitors Cough  . Lipitor [Atorvastatin] Other (See Comments)    myalgia  . Codeine Itching    The results of significant diagnostics from this hospitalization (including imaging, microbiology, ancillary and laboratory) are listed below for reference.    Significant Diagnostic Studies: Dg Chest 2 View  Result Date: 06/06/2017 CLINICAL DATA:  51 year old male with chest pain. EXAM: CHEST  2 VIEW COMPARISON:  02/13/2017 FINDINGS: The heart size and mediastinal contours are within normal limits. Both lungs are clear. The visualized skeletal structures are unremarkable. IMPRESSION: No active cardiopulmonary disease. Electronically Signed   By: Kristopher Oppenheim M.D.   On: 06/06/2017 15:12   Nm Myocar Multi W/spect W/wall Motion / Ef  Result Date: 06/08/2017  There was no ST segment deviation noted during stress.  The study is normal.  This is a low risk study.  The left ventricular ejection fraction is normal (55-65%).  Nuclear stress EF calculated as 55% although visually appears mildly reduced. Consider 2D echo to correlate.     Labs: Basic Metabolic Panel: Recent Labs  Lab 06/06/17 1439  NA 138  K 3.9  CL 104  CO2 27  GLUCOSE 130*  BUN 19  CREATININE 1.11  CALCIUM 9.2   CBC: Recent Labs  Lab 06/06/17 1439  WBC 7.3  HGB 15.4  HCT 46.1  MCV 87.3  PLT 212   Cardiac Enzymes: Recent Labs  Lab 06/06/17 1726 06/07/17 0047 06/07/17 0410 06/07/17 0645  TROPONINI <0.03 <0.03 <0.03 <0.03    Principal Problem:   Chest pain on exertion Active Problems:   Dyslipidemia, statin intol   Essential hypertension   DM type 2 (diabetes mellitus, type 2) ([place])   Coronary artery disease involving native coronary artery of native heart   Time  coordinating discharge: 45 minutes  Signed:  Murray Hodgkins, MD [facility] 06/08/2017, 4:32 PM

## 2017-06-08 NOTE — Progress Notes (Signed)
Progress Note  Patient Name: Dylan [name] Date of Encounter: 06/08/2017  Primary Cardiologist: Dr. Ellyn Hack  Subjective   Continues to have mild chest pressure that goes to mid back and is constant with waxing and waning. No longer having any shortness of breath.   Inpatient Medications    Scheduled Meds: . clopidogrel  75 mg Oral Daily  . enoxaparin (LOVENOX) injection  40 mg Subcutaneous QHS  . febuxostat  80 mg Oral Daily  . furosemide  40 mg Oral BID  . nebivolol  20 mg Oral Daily  . pantoprazole  40 mg Oral Daily   Continuous Infusions:  PRN Meds: acetaminophen, ibuprofen, morphine injection, nitroGLYCERIN   Vital Signs    Vitals:   06/08/17 0942 06/08/17 0944 06/08/17 0946 06/08/17 1047  BP: 137/84 (!) 142/82 134/81 (!) 148/82  Pulse:    75  Resp:      Temp:      TempSrc:      SpO2:      Weight:      Height:        Intake/Output Summary (Last 24 hours) at 06/08/2017 1136 Last data filed at 06/08/2017 0700 Gross per 24 hour  Intake 340 ml  Output 2050 ml  Net -1710 ml   Filed Weights   06/06/17 2311 06/07/17 0421 06/08/17 0522  Weight: 291 lb 14.4 oz (132.4 kg) 291 lb 1.6 oz (132 kg) 290 lb 9.6 oz (131.8 kg)    Telemetry    Sinus rhythm in th 60's - Personally Reviewed  ECG    No new tracings - Personally Reviewed  Physical Exam   GEN: No acute distress.   Neck: No JVD Cardiac: RRR, no murmurs, rubs, or gallops.  Respiratory: Clear to auscultation bilaterally. GI: Soft, nontender, non-distended  MS: No edema; No deformity. Neuro:  Nonfocal  Psych: Normal affect   Labs    Chemistry Recent Labs  Lab 06/06/17 1439  NA 138  K 3.9  CL 104  CO2 27  GLUCOSE 130*  BUN 19  CREATININE 1.11  CALCIUM 9.2  GFRNONAA >60  GFRAA >60  ANIONGAP 7     Hematology Recent Labs  Lab 06/06/17 1439  WBC 7.3  RBC 5.28  HGB 15.4  HCT 46.1  MCV 87.3  MCH 29.2  MCHC 33.4  RDW 14.0  PLT 212    Cardiac Enzymes Recent Labs  Lab  06/06/17 1726 06/07/17 0047 06/07/17 0410 06/07/17 0645  TROPONINI <0.03 <0.03 <0.03 <0.03    Recent Labs  Lab 06/06/17 1457  TROPIPOC 0.01     BNPNo results for input(s): BNP, PROBNP in the last 168 hours.   DDimer No results for input(s): DDIMER in the last 168 hours.   Radiology    Dg Chest 2 View  Result Date: 06/06/2017 CLINICAL DATA:  51 year old male with chest pain. EXAM: CHEST  2 VIEW COMPARISON:  02/13/2017 FINDINGS: The heart size and mediastinal contours are within normal limits. Both lungs are clear. The visualized skeletal structures are unremarkable. IMPRESSION: No active cardiopulmonary disease. Electronically Signed   By: Kristopher Oppenheim M.D.   On: 06/06/2017 15:12    Cardiac Studies   LHC 07/23/2016 Conclusion    The left ventricular systolic function is normal.  LV end diastolic pressure is normal.  The left ventricular ejection fraction is 55-65% by visual estimate.  Prox RCA to Dist RCA lesion, 15 %stenosed.  Prox LAD to Mid LAD lesion, 20 %stenosed.  Ost Cx to Prox Cx  lesion, 20 %stenosed.   1. Nonobstructive CAD- no change from one year ago 2. Normal LV function and LVEDP  Plan: continue medical management.    LHC 07/20/15:  Nonobstructive CAD, Normal LV function.   Patient Profile     51 y.o. male with a history of ASCAD with multiple PCI's to the right coronary artery including 2011 and 2016 due to in-stent restenosis, HTN, HLD, DM who presented with chest pain. Negative troponin X 5.   Assessment & Plan    1. Chest pain: Developed symptoms while walking through [place] on Friday night. Symptoms continued through Saturday. Continues to have mild constant chest pressure that waxes and wanes, no dyspnea. Enzymes were negative and EKG was non-acute. Has completed nuclear stress test today awaiting results.   2. CAD: S/P full metal jacket RCA in setting of repeated instent stenosis. Most recent caths in 2016 and 2017 show patent  stents and other mild non-obstructive disease. On Plavix for life.   3. Hypertension: Currently well controlled and stable.   4. Hyperlipidemia: On Repatha, LDL 8.   5. DM type 2: Hgb A1c 6.1 in 07/2016.   For questions or updates, please contact [facility] Please consult [email or web address] for contact info under Cardiology/STEMI.      Signed, Daune Perch, NP  06/08/2017, 11:36 AM    Personally seen and examined. Agree with above.  CAD - many PCI. RCA. 5/10 constant pain. Repatha. LDL 8. RRR, CTAB. MAE Waiting for results of stress test.   Candee Furbish, MD

## 2017-06-08 NOTE — Progress Notes (Signed)
  PROGRESS NOTE  Dylan [name]:[id number] DOB: 04-05-1966 DOA: 06/06/2017 PCP: [name], PA  Brief Narrative: 32yom PMH CAD, MI, s/p PCA with stent, presented with intermittent CP x1 month. Admitted for CP evaluation.  Assessment/Plan Chest pain with typical and atypical features, known CAD s/p PCA 2011. LHC 12/17 >> nonobstructive CAD. Troponins negative, EKG nonacute - pain ongoing without relief. Appears calm and comfortable throughout 20 minute interview and exam - continue clopidogrel, nebivolol, olmesartan.   - f/u nuclear stress results and cardiology evaluation - Wells 0. No hx to suggest VTE.  Essential HTN - remains stable. Continue BB, ARB  Hyperlipidemia  - on Repatha. LDL 8.  DM type 2 - stable hold metformin - SSI   DVT prophylaxis: enoxaparin Code Status: full Family Communication: none Disposition Plan: home    Murray Hodgkins, MD  [facility] Direct contact: [phone] --[place] [place]  --[email or web address]; password [email or web address]  7PM-7AM contact night coverage as above 06/08/2017, 2:27 PM  LOS: 0 days   Consultants:  Cardiology   Procedures:    Antimicrobials:    Interval history/Subjective: Continues to have 5/10 chest pain. Worse with direct palpation. Breathing ok.  Objective: Vitals:  Vitals:   06/08/17 0946 06/08/17 1047  BP: 134/81 (!) 148/82  Pulse:  75  Resp:    Temp:    SpO2:      Exam:  Constitutional:  . Appears calm and comfortable throughout interview Eyes:  . pupils and irises appear normal ENMT:  . grossly normal hearing  Respiratory:  . CTA bilaterally, no w/r/r.  . Respiratory effort normal.  Cardiovascular:  . RRR, no m/r/g . 1+ BLE extremity edema   Psychiatric:  . Mental status o Mood, affect appropriate  I have personally reviewed the following:  Filed Weights   06/06/17 2311 06/07/17 0421 06/08/17 0522  Weight: 132.4 kg (291 lb 14.4 oz) 132 kg (291 lb 1.6 oz) 131.8 kg (290 lb 9.6  oz)   Weight change: 2.54 kg (5 lb 9.6 oz)  UOP: 2050 I/O since admission: -2290  Last BM charted:  Foley: no Telemetry: SR  Labs:    Imaging studies:    Medical tests:    Test discussed with performing physician:    Decision to obtain old records:    Review and summation of old records:    Scheduled Meds: . clopidogrel  75 mg Oral Daily  . enoxaparin (LOVENOX) injection  40 mg Subcutaneous QHS  . febuxostat  80 mg Oral Daily  . furosemide  40 mg Oral BID  . nebivolol  20 mg Oral Daily  . pantoprazole  40 mg Oral Daily   Continuous Infusions:  Principal Problem:   Chest pain on exertion Active Problems:   Dyslipidemia, statin intol   Essential hypertension   DM type 2 (diabetes mellitus, type 2) ([place])   Coronary artery disease involving native coronary artery of native heart   LOS: 0 days

## 2017-06-08 NOTE — Progress Notes (Signed)
lexiscan portion completed without complications, Nuc results to follow

## 2017-06-08 NOTE — Progress Notes (Signed)
Pt IV discontinued, catheter intact and telemetry removed. Pt discharge education provided at bedside. Pt has all belongings. Pt discharged via wheelchair with nurse staff

## 2017-06-11 ENCOUNTER — Other Ambulatory Visit: Payer: Self-pay | Admitting: Cardiology

## 2017-06-11 ENCOUNTER — Telehealth: Payer: Self-pay | Admitting: Cardiology

## 2017-06-11 DIAGNOSIS — R079 Chest pain, unspecified: Secondary | ICD-10-CM

## 2017-06-11 NOTE — Telephone Encounter (Signed)
I called the patient regarding recommendation for echo following stress test. He agrees and order has been placed. Hopefully will be done prior to his f/u appt on 11/26 and can go over results at that time.   I will route this to [facility] for scheduling.   Daune Perch, AGNP-C [facility] 06/11/2017  3:48 PM Pager: [phone]

## 2017-06-12 NOTE — Telephone Encounter (Signed)
Dylan [name],  Mr. Lacey Jensen is scheduled for his echo on 11/13 at 2:00pm.

## 2017-06-16 ENCOUNTER — Other Ambulatory Visit: Payer: Self-pay

## 2017-06-16 ENCOUNTER — Encounter (HOSPITAL_COMMUNITY): Payer: Self-pay

## 2017-06-16 ENCOUNTER — Ambulatory Visit (HOSPITAL_BASED_OUTPATIENT_CLINIC_OR_DEPARTMENT_OTHER): Payer: BLUE CROSS/BLUE SHIELD

## 2017-06-16 ENCOUNTER — Emergency Department (HOSPITAL_COMMUNITY)
Admission: EM | Admit: 2017-06-16 | Discharge: 2017-06-16 | Disposition: A | Discharge status: Home / Self Care | Payer: BLUE CROSS/BLUE SHIELD | Attending: Emergency Medicine | Admitting: Emergency Medicine

## 2017-06-16 DIAGNOSIS — R079 Chest pain, unspecified: Secondary | ICD-10-CM

## 2017-06-16 DIAGNOSIS — I10 Essential (primary) hypertension: Secondary | ICD-10-CM | POA: Insufficient documentation

## 2017-06-16 DIAGNOSIS — I2510 Atherosclerotic heart disease of native coronary artery without angina pectoris: Secondary | ICD-10-CM | POA: Insufficient documentation

## 2017-06-16 DIAGNOSIS — Z87891 Personal history of nicotine dependence: Secondary | ICD-10-CM | POA: Insufficient documentation

## 2017-06-16 DIAGNOSIS — Z955 Presence of coronary angioplasty implant and graft: Secondary | ICD-10-CM | POA: Diagnosis not present

## 2017-06-16 DIAGNOSIS — R55 Syncope and collapse: Secondary | ICD-10-CM | POA: Diagnosis not present

## 2017-06-16 DIAGNOSIS — E119 Type 2 diabetes mellitus without complications: Secondary | ICD-10-CM | POA: Diagnosis not present

## 2017-06-16 DIAGNOSIS — Z7902 Long term (current) use of antithrombotics/antiplatelets: Secondary | ICD-10-CM | POA: Insufficient documentation

## 2017-06-16 DIAGNOSIS — Z79899 Other long term (current) drug therapy: Secondary | ICD-10-CM | POA: Insufficient documentation

## 2017-06-16 DIAGNOSIS — R42 Dizziness and giddiness: Secondary | ICD-10-CM | POA: Diagnosis not present

## 2017-06-16 DIAGNOSIS — R404 Transient alteration of awareness: Secondary | ICD-10-CM | POA: Diagnosis not present

## 2017-06-16 LAB — URINALYSIS, ROUTINE W REFLEX MICROSCOPIC
BACTERIA UA: NONE SEEN
Bilirubin Urine: NEGATIVE
Glucose, UA: >=500 mg/dL — AB
Hgb urine dipstick: NEGATIVE
KETONES UR: NEGATIVE mg/dL
LEUKOCYTES UA: NEGATIVE
Nitrite: NEGATIVE
PH: 6 (ref 5.0–8.0)
PROTEIN: NEGATIVE mg/dL
SQUAMOUS EPITHELIAL / LPF: NONE SEEN
Specific Gravity, Urine: 1.008 (ref 1.005–1.030)

## 2017-06-16 LAB — CBC
HEMATOCRIT: 48 % (ref 39.0–52.0)
Hemoglobin: 16.5 g/dL (ref 13.0–17.0)
MCH: 29.8 pg (ref 26.0–34.0)
MCHC: 34.4 g/dL (ref 30.0–36.0)
MCV: 86.8 fL (ref 78.0–100.0)
Platelets: 199 10*3/uL (ref 150–400)
RBC: 5.53 MIL/uL (ref 4.22–5.81)
RDW: 13.5 % (ref 11.5–15.5)
WBC: 6.6 10*3/uL (ref 4.0–10.5)

## 2017-06-16 LAB — I-STAT TROPONIN, ED: TROPONIN I, POC: 0 ng/mL (ref 0.00–0.08)

## 2017-06-16 LAB — BASIC METABOLIC PANEL
Anion gap: 11 (ref 5–15)
BUN: 11 mg/dL (ref 6–20)
CHLORIDE: 101 mmol/L (ref 101–111)
CO2: 25 mmol/L (ref 22–32)
Calcium: 8.5 mg/dL — ABNORMAL LOW (ref 8.9–10.3)
Creatinine, Ser: 0.98 mg/dL (ref 0.61–1.24)
GFR calc Af Amer: >60 mL/min (ref >60)
GFR calc non Af Amer: >60 mL/min (ref >60)
Glucose, Bld: 113 mg/dL — ABNORMAL HIGH (ref 65–99)
POTASSIUM: 3.6 mmol/L (ref 3.5–5.1)
Sodium: 137 mmol/L (ref 135–145)

## 2017-06-16 LAB — CBG MONITORING, ED: Glucose-Capillary: 126 mg/dL — ABNORMAL HIGH (ref 65–99)

## 2017-06-16 NOTE — Discharge Instructions (Signed)
Thank you for allowing [place] to care for you  Your symptoms are believed to have been caused by an episode of hypoglycemia.  - Please follow up with your PCP and obtain a replacement glucose monitor.   Return to the ED if you experience severe symptoms

## 2017-06-16 NOTE — ED Notes (Signed)
Pt oob to br with steady gait. Curently sitting at bedsdie.

## 2017-06-16 NOTE — ED Provider Notes (Signed)
[facility] EMERGENCY DEPARTMENT Provider Note   CSN: [id number] Arrival date & time: 06/16/17  1029     History   Chief Complaint Chief Complaint  Patient presents with  . Dizziness  . Near Syncope    HPI Dylan [name] is a 51 y.o. male.  Patient with a history of CAD (s/p MI, DESx3), CVA, HLD, DM, HTN, and Anemia presenting following an episode of lightheadedness and near syncope. Patient states he was at work sitting at his computer and working on a spreadsheet when he became confused and could not remember what the next step was and felt his vision was becoming white at the edges. He then stood up and walked by coworkers telling them to call his wife he he were to pass out because he was feeling light headed and drifted to the right while walking. He sat down and soon felt better, with this episode lasting about 30 sec to 1 minute. He the drank apple juice and ate half a candy bar because he felt he may have had low blood sugar. He was then able to walk to the nurse at his work were his blood sugar was checked and found to be in the 100's, but his blood pressure was shown to be around 338 systolic per patient. Patient denies chest pain or palpitations at the time of the event, wearing anything tight around his neck, SOB. He addtionally denies nausea, diaphoresis, or loss of consciousness. He endorses an episode of a twinge of chest pain while in the ED similar to his chronic chest pain.      Past Medical History:  Diagnosis Date  . CAD S/P percutaneous coronary angioplasty 07/2010; 07/2012; 08/2014   1) 2/'11: MI - 100% RCA - PCI (3 Taxus ION DES 3.0 x 28, 3.0 x 24 & 3.5 x 12 distal-prox); b) ISR of RCA stent - -AnngioSculpt PTCA; c) NSTEMI 1/'16: mild ISR with Thrombosis of RCA Stents (in setting of Mesenteric Vein Thrombosis) - Cutting Balloon PTCA; c. Re-look Cath 07/2015 & 07/2016: ~15% ISR in RCA,20% p-mLAD & o-pCx.  . Coronary stent thrombosis 08/15/2014    . DM type 2 (diabetes mellitus, type 2) ([place]) 06/07/2017  . Essential hypertension 07/07/2012  . Finding of multiple premature atrial contractions by electrocardiography    Symptomatic palpitations ; worse with poor sleep and fatigue along with dehydration.   . Hyperlipidemia with target LDL less than 70 09/01/2014  . Mesenteric vein thrombosis   . Migraines    "maybe one/yr" (07/06/2012)  . NSTEMI (non-ST elevated myocardial infarction) ([place]) 07/2010, 08/2014   And unstable angina in February 20 13th; Echo 1/12/'16: Moderate concentric LVH. EF 60-65% with no regional WMA. Gr 1 DD, otherwise normal  . Stroke ([place]) 2011; 2013   S/P cardiac cath - embolic stroke; denies residual (07/06/2012)  . 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 04/19/2013   No evidence of CVA on MRI/MRA & Dliated Eye Examination. ruptured blood vessel & not occlusion.    Patient Active Problem List   Diagnosis Date Noted  . DM type 2 (diabetes mellitus, type 2) ([place]) 06/07/2017  . Coronary artery disease involving native coronary artery of native heart   . Chest pain on exertion 06/06/2017  . Medication management 09/04/2016  . Diastolic dysfunction 25/12/3974  . Finding of multiple premature atrial contractions by electrocardiography 10/05/2015  . Iron deficiency  anemia 09/29/2014  . Obesity (BMI 30-39.9) 09/09/2014  . Perforation bowel - 2/2 Mesenteric Ischemia from Mesenteric Vein Thrombosis.   . Intestinal perforation ([place]) 08/26/2014  . Coronary stent thrombosis 08/15/2014  . Acute embolism and thrombosis of vein   . Mesenteric vein thrombosis   . 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 Portal vein thrombosis 08/05/2014  . 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 04/19/2013  . Possible Retinal artery  branch occlusion of right eye = POST CATHETERIZATION -- by dilated Eye exam - no evidence of embolic occlusoin; possible microperforation. NOT CVA 04/19/2013    Class: Acute  . Hypertriglyceridemia - TG >500 04/19/2013  . Dyslipidemia, statin intol 07/07/2012  . Essential hypertension 07/07/2012  . Smoker, quit 06/30/10 07/07/2012  . CAD S/P PCI with 3 DES stents to RCA and PTCA x2 for ISR and thrombosis  07/06/2012  . H/O: CVA (cerebrovascular accident), post cardiac cath, minimal speech residual 11/11 post PCI 07/06/2012  . NSTEMI (non-ST elevated myocardial infarction) - 07/2010, 2013, 08/2014 07/04/2010    Past Surgical History:  Procedure Laterality Date  . CARDIAC CATHETERIZATION  04/18/2013  . CORONARY ANGIOPLASTY  07/06/2012; 08/15/2014   a) 12/'13: PTCA of 80% ISR mRCA - AngioSculpt; b) PTCA of  ISR/thrombosis  . CORONARY ANGIOPLASTY WITH STENT PLACEMENT  07/2010   inferior wall MI - 3 Taxus Ion DES (3.0x23mm, 3.0x27mm, 3.5x33mm) to prox and mid RCA  . SEPTOPLASTY  2001  . TRANSTHORACIC ECHOCARDIOGRAM  08/15/2014   Moderate concentric LVH. EF 60-65% with no regional WMA. Gr 1 DD, otherwise normal       Home Medications    Prior to Admission medications   Medication Sig Start Date End Date Taking? Authorizing Provider  clopidogrel (PLAVIX) 75 MG tablet TAKE 1 TABLET(75 MG) BY MOUTH DAILY WITH BREAKFAST 10/28/16  Yes [name], MD  Empagliflozin-Metformin HCl ER (SYNJARDY XR) 25-1000 MG TB24 Take 1 tablet by mouth daily.   Yes [provider]  Evolocumab (REPATHA SURECLICK) 784 MG/ML SOAJ Inject 140 mg into the skin every 14 (fourteen) days. 04/15/17  Yes [name], MD  Febuxostat (ULORIC) 80 MG TABS Take 80 mg by mouth daily.   Yes [provider]  furosemide (LASIX) 40 MG tablet TAKE 1 TABLET(40 MG) BY MOUTH TWICE DAILY 05/21/17  Yes [name], MD  ibuprofen (ADVIL,MOTRIN) 200 MG tablet Take 600 mg by mouth every 6 (six) hours as needed for  headache or moderate pain.    Yes [provider]  Nebivolol HCl (BYSTOLIC) 20 MG TABS Take 1 tablet (20 mg total) by mouth every morning. Patient taking differently: Take 20 mg by mouth daily.  05/11/17  Yes [name], MD  nitroGLYCERIN (NITROSTAT) 0.4 MG SL tablet Place 1 tablet (0.4 mg total) under the tongue every 5 (five) minutes x 3 doses as needed for chest pain. 10/03/15  Yes [name], MD  Polyethyl Glycol-Propyl Glycol (SYSTANE OP) Place 1 drop into both eyes daily as needed (dry eyes).   Yes [provider]  olmesartan (BENICAR) 20 MG tablet TAKE 1 TABLET(20 MG) BY MOUTH DAILY Patient not taking: Reported on 06/06/2017 01/22/17   [name], MD    Family History Family History  Problem Relation Age of Onset  . Atrial fibrillation Mother   . Mitral valve prolapse Mother   . Coronary artery disease  Father        CABG, aortic aneursym,  . Heart attack Father   . Heart attack Paternal Uncle   . Heart attack Paternal Grandfather     Social History Social History   Tobacco Use  . Smoking status: Former Smoker    Packs/day: 1.00    Years: 15.00    Pack years: 15.00    Types: Cigarettes    Last attempt to quit: 06/30/2010    Years since quitting: 6.9  . Smokeless tobacco: Current User    Types: Snuff    Last attempt to quit: 06/30/2010  Substance Use Topics  . Alcohol use: Yes    Alcohol/week: 4.2 oz    Types: 7 Shots of liquor per week    Comment: a  glass of scotch per day  . Drug use: No     Allergies   Crestor [rosuvastatin]; Zofran [ondansetron hcl]; Ace inhibitors; Lipitor [atorvastatin]; and Codeine   Review of Systems Review of Systems  Constitutional: Negative for chills and fever.  HENT: Negative for ear pain and sore throat.   Eyes: Negative for pain and visual disturbance.  Respiratory: Negative for cough and shortness of breath.   Cardiovascular: Negative for chest pain and palpitations.  Gastrointestinal:  Negative for abdominal pain and vomiting.  Genitourinary: Negative for dysuria and hematuria.  Musculoskeletal: Negative for arthralgias and back pain.  Skin: Negative for color change and rash.  Neurological: Negative for seizures and syncope.  All other systems reviewed and are negative.    Physical Exam Updated Vital Signs BP (!) 151/87   Pulse 62   Temp 99.3 F (37.4 C) (Oral)   Resp 14   Ht 6\' 2"  (1.88 m)   Wt 129.3 kg (285 lb)   SpO2 99%   BMI 36.59 kg/m   Physical Exam  Constitutional: He is oriented to person, place, and time. He appears well-developed and well-nourished.  HENT:  Head: Normocephalic and atraumatic.  Eyes: Conjunctivae are normal.  Neck: Neck supple.  Cardiovascular: Normal rate and regular rhythm.  No murmur heard. Pulmonary/Chest: Effort normal and breath sounds normal. No respiratory distress.  Abdominal: Soft. There is no tenderness.  Musculoskeletal: He exhibits no edema.  Neurological: He is alert and oriented to person, place, and time. No cranial nerve deficit or sensory deficit.  Skin: Skin is warm and dry.  Psychiatric: He has a normal mood and affect.  Nursing note and vitals reviewed.    ED Treatments / Results  Labs (all labs ordered are listed, but only abnormal results are displayed) Labs Reviewed  BASIC METABOLIC PANEL - Abnormal; Notable for the following components:      Result Value   Glucose, Bld 113 (*)    Calcium 8.5 (*)    All other components within normal limits  URINALYSIS, ROUTINE W REFLEX MICROSCOPIC - Abnormal; Notable for the following components:   Color, Urine STRAW (*)    Glucose, UA >=500 (*)    All other components within normal limits  CBG MONITORING, ED - Abnormal; Notable for the following components:   Glucose-Capillary 126 (*)    All other components within normal limits  CBC  CBG MONITORING, ED  I-STAT TROPONIN, ED    EKG  EKG Interpretation  Date/Time:  Tuesday June 16 2017 10:43:44  EST Ventricular Rate:  62 PR Interval:    QRS Duration: 89 QT Interval:  419 QTC Calculation: 426 R Axis:   25 Text Interpretation:  Sinus rhythm Probable left atrial  enlargement Low voltage, precordial leads Confirmed by Isla Pence [phone]) on 06/16/2017 11:15:55 AM Also confirmed by Isla Pence [phone]), editor Philomena Doheny [phone])  on 06/16/2017 12:32:46 PM       Radiology No results found.  Procedures Procedures (including critical care time)  Medications Ordered in ED Medications - No data to display   Initial Impression / Assessment and Plan / ED Course  I have reviewed the triage vital signs and the nursing notes.  Pertinent labs & imaging results that were available during my care of the patient were reviewed by me and considered in my medical decision making (see chart for details).    Patent with brief episode (30-60 seconds) of confusion, lightheaded ness and associated brief visual changes. Patient improved with apple juice and candy bar and had normal Blood sugar when checked at work after having this intake which is lower than would be suspected. He was also found to have BP to 283 systolic, but BP has been normal here. Patients work up shows negative troponin, Normal CBC and BMP without significant abnormality. U/A showed glucose >500 (on Empagliflozin). EKG was non ischemic.  Supect episode of hypoglycemia. Patient states that his blood sugar was about 20-30 lower than it typically is when he checked it in the AM and he states based on his previous continuous monitoring he typically experiences a low around the time of his episode today.  Patient discharged with instruction to follow up with his PCP.  Final Clinical Impressions(s) / ED Diagnoses   Final diagnoses:  Lightheadedness  Near syncope    ED Discharge Orders    None       Neva Seat, MD 06/16/17 1248    Isla Pence, MD 06/16/17 1439

## 2017-06-16 NOTE — ED Triage Notes (Signed)
Pt arrives EMS from work where he had episode of dizziness and near syncope . No LOC . Similar episode within last 10 days resulting in admission.  Hx of multiple MI and CVA.

## 2017-06-18 DIAGNOSIS — I10 Essential (primary) hypertension: Secondary | ICD-10-CM | POA: Diagnosis not present

## 2017-06-18 LAB — ECHOCARDIOGRAM COMPLETE
Height: 74 in
Weight: 4560 oz

## 2017-06-23 DIAGNOSIS — I119 Hypertensive heart disease without heart failure: Secondary | ICD-10-CM | POA: Diagnosis not present

## 2017-06-23 DIAGNOSIS — I491 Atrial premature depolarization: Secondary | ICD-10-CM | POA: Diagnosis not present

## 2017-06-23 DIAGNOSIS — Z6839 Body mass index (BMI) 39.0-39.9, adult: Secondary | ICD-10-CM | POA: Diagnosis not present

## 2017-06-28 NOTE — Progress Notes (Signed)
Cardiology Office Note    Date:  06/29/2017   ID:  Dylan [name], DOB 11-25-1965, MRN [id number]  PCP:  [name], PA  Cardiologist: Dr. Ellyn Hack --> Wishes to switch to Dr. Sallyanne Kuster  Chief Complaint  Patient presents with  . Hospitalization Follow-up    hospital follow up     History of Present Illness:    Dylan [name] is a 51 y.o. male with past medical history of CAD (s/p full metal jacket of RCA in 2011, PTCA of RCA in 2011, 2013, and 08/2014 with patent stents by cath in 07/2016), HTN, HLD, Type 2 DM, and prior CVA who presents to the office today for hospital follow-up.   He was recently admitted to [facility] from 11/3 - 06/08/2017 for evaluation of chest pain. Reported developing exertional chest pressure over the past several days. His cardiac enzymes remained negative and EKG showed no acute ischemic changes. A NST was pursued which showed no significant ischemia, overall being a low-risk study. An outpatient echocardiogram was obtained and showed a preserved EF of 60% to 65% with no regional wall motion abnormalities. The ascending aorta was mildly dilated.  He was evaluated at [facility] ED in the interim on 06/16/2017 for evaluation of lightheadedness and near syncope. He denies any actual syncope but felt like he was going to lose consciousness. He was concerned that his blood sugar was low, therefore he consumed a candy bar and apple juice prior to EMS arrival. SBP was initially elevated in the 220's but was within normal limits while in the ED. His urinalysis showed a glucose greater than 500 and it was thought that perhaps his episode was due to hypoglycemia which was treated with his snack.  In talking with the patient today, he reports occasional episodes of dizziness and notes his SBP is in the 170s-180s at that time. He has been taking Bystolic since his recent hospitalization but reports Benicar was stopped at that time. He was evaluated in urgent care in  the interim and placed on Losartan 25 mg daily but did not feel like this helped with blood pressure, therefore he self-discontinued the medication.   He denies any repeat episodes of chest discomfort, dyspnea on exertion, orthopnea, or PND. Has chronic lower extremity edema. Has experienced improvement in his palpitations since switching from Lopressor to [employer].    Past Medical History:  Diagnosis Date  . CAD S/P percutaneous coronary angioplasty 07/2010; 07/2012; 08/2014   1) 2/'11: MI - 100% RCA - PCI (3 Taxus ION DES 3.0 x 28, 3.0 x 24 & 3.5 x 12 distal-prox); b) ISR of RCA stent - -AnngioSculpt PTCA; c) NSTEMI 1/'16: mild ISR with Thrombosis of RCA Stents (in setting of Mesenteric Vein Thrombosis) - Cutting Balloon PTCA; c. Re-look Cath 07/2015 & 07/2016: ~15% ISR in RCA,20% p-mLAD & o-pCx.  . Coronary stent thrombosis 08/15/2014  . DM type 2 (diabetes mellitus, type 2) ([place]) 06/07/2017  . Essential hypertension 07/07/2012  . Finding of multiple premature atrial contractions by electrocardiography    Symptomatic palpitations ; worse with poor sleep and fatigue along with dehydration.   . Hyperlipidemia with target LDL less than 70 09/01/2014  . Mesenteric vein thrombosis   . Migraines    "maybe one/yr" (07/06/2012)  . NSTEMI (non-ST elevated myocardial infarction) ([place]) 07/2010, 08/2014   And unstable angina in February 20 13th; Echo 1/12/'16: Moderate concentric LVH. EF 60-65% with no regional WMA. Gr 1 DD, otherwise normal  . Stroke (  [place]) 2011; 2013   S/P cardiac cath - embolic stroke; denies residual (07/06/2012)  . 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 04/19/2013   No evidence of CVA on MRI/MRA & Dliated Eye Examination. ruptured blood vessel & not occlusion.    Past Surgical History:  Procedure Laterality Date  . BOWEL RESECTION N/A 08/28/2014   Procedure: SMALL BOWEL RESECTION;   Surgeon: [name], MD;  Location: [place];  Service: General;  Laterality: N/A;  . CARDIAC CATHETERIZATION  04/18/2013  . CARDIAC CATHETERIZATION N/A 07/20/2015   Procedure: Left Heart Cath and Coronary Angiography;  Surgeon: [name], MD;  Location: [place] [facility];  Service: Cardiovascular;  15% ISR in the RCA stent segment, 20% proximal-mid LAD as well as ostial and proximal circumflex. Normal LV function.  Marland Kitchen CARDIAC CATHETERIZATION N/A 07/23/2016   Procedure: Left Heart Cath and Coronary Angiography;  Surgeon: [name], MD;  Location: [place] [facility];  Service: Cardiovascular: E 55-65%. ~15% diffuse RCA ISR, ~20% diffuse pLAD & pCx  . CORONARY ANGIOPLASTY  07/06/2012; 08/15/2014   a) 12/'13: PTCA of 80% ISR mRCA - AngioSculpt; b) PTCA of  ISR/thrombosis  . CORONARY ANGIOPLASTY WITH STENT PLACEMENT  07/2010   inferior wall MI - 3 Taxus Ion DES (3.0x64mm, 3.0x36mm, 3.5x79mm) to prox and mid RCA  . LAPAROSCOPIC APPENDECTOMY N/A 08/28/2014   Procedure: APPENDECTOMY LAPAROSCOPIC;  Surgeon: [name], MD;  Location: [place];  Service: General;  Laterality: N/A;  . LAPAROSCOPY  08/28/2014   Procedure: LAPAROSCOPY DIAGNOSTIC;  Surgeon: [name], MD;  Location: [place];  Service: General;;  . LEFT HEART CATHETERIZATION WITH CORONARY ANGIOGRAM N/A 06/11/2012   Procedure: LEFT HEART CATHETERIZATION WITH CORONARY ANGIOGRAM;  Surgeon: [name], MD;  Location: [place] C[facility];  Service: Cardiovascular;  Laterality: N/A;  . LEFT HEART CATHETERIZATION WITH CORONARY ANGIOGRAM N/A 04/18/2013   Procedure: LEFT HEART CATHETERIZATION WITH CORONARY ANGIOGRAM;  Surgeon: [name], MD;  Location: [facility] CATH LAB;  Service: Cardiovascular;  Laterality: N/A;  . LEFT HEART CATHETERIZATION WITH CORONARY ANGIOGRAM N/A 08/15/2014   Procedure: LEFT HEART CATHETERIZATION WITH CORONARY ANGIOGRAM;  Surgeon: [name], MD;  Location: [facility] CATH LAB;  Service: Cardiovascular;  Laterality: N/A;    . PERCUTANEOUS CORONARY STENT INTERVENTION (PCI-S) N/A 07/06/2012   Procedure: PERCUTANEOUS CORONARY STENT INTERVENTION (PCI-S);  Surgeon: [name], MD;  Location: [facility] CATH LAB;  Service: Cardiovascular;  Laterality: N/A;  . SEPTOPLASTY  2001  . TRANSTHORACIC ECHOCARDIOGRAM  08/15/2014   Moderate concentric LVH. EF 60-65% with no regional WMA. Gr 1 DD, otherwise normal    Current Medications: Outpatient Medications Prior to Visit  Medication Sig Dispense Refill  . clopidogrel (PLAVIX) 75 MG tablet TAKE 1 TABLET(75 MG) BY MOUTH DAILY WITH BREAKFAST 90 tablet 2  . Empagliflozin-Metformin HCl ER (SYNJARDY XR) 25-1000 MG TB24 Take 1 tablet by mouth daily.    . Evolocumab (REPATHA SURECLICK) 409 MG/ML SOAJ Inject 140 mg into the skin every 14 (fourteen) days. 2 pen 11  . Febuxostat (ULORIC) 80 MG TABS Take 80 mg by mouth daily.    . furosemide (LASIX) 40 MG tablet TAKE 1 TABLET(40 MG) BY MOUTH TWICE DAILY 180 tablet 1  . ibuprofen (ADVIL,MOTRIN) 200 MG tablet Take 600 mg by mouth every 6 (six) hours as needed for headache or moderate pain.     . Nebivolol HCl (BYSTOLIC)  20 MG TABS Take 1 tablet (20 mg total) by mouth every morning. (Patient taking differently: Take 20 mg by mouth daily. ) 90 tablet 1  . nitroGLYCERIN (NITROSTAT) 0.4 MG SL tablet Place 1 tablet (0.4 mg total) under the tongue every 5 (five) minutes x 3 doses as needed for chest pain. 25 tablet 2  . Polyethyl Glycol-Propyl Glycol (SYSTANE OP) Place 1 drop into both eyes daily as needed (dry eyes).    Marland Kitchen olmesartan (BENICAR) 20 MG tablet TAKE 1 TABLET(20 MG) BY MOUTH DAILY 30 tablet 5   No facility-administered medications prior to visit.      Allergies:   Crestor [rosuvastatin]; Zofran [ondansetron hcl]; Ace inhibitors; Lipitor [atorvastatin]; and Codeine   Social History   Socioeconomic History  . Marital status: Married    Spouse name: None  . Number of children: 2  . Years of education: 18  . Highest education  level: None  Social Needs  . Financial resource strain: None  . Food insecurity - worry: None  . Food insecurity - inability: None  . Transportation needs - medical: None  . Transportation needs - non-medical: None  Occupational History  . Occupation: Pharmacologist: [employer]  Tobacco Use  . Smoking status: Former Smoker    Packs/day: 1.00    Years: 15.00    Pack years: 15.00    Types: Cigarettes    Last attempt to quit: 06/30/2010    Years since quitting: 7.0  . Smokeless tobacco: Current User    Types: Snuff    Last attempt to quit: 06/30/2010  Substance and Sexual Activity  . Alcohol use: Yes    Alcohol/week: 4.2 oz    Types: 7 Shots of liquor per week    Comment: a  glass of scotch per day  . Drug use: No  . Sexual activity: Yes  Other Topics Concern  . None  Social History Narrative  . None     Family History:  The patient's family history includes Atrial fibrillation in his mother; Coronary artery disease in his father; Heart attack in his father, paternal grandfather, and paternal uncle; Mitral valve prolapse in his mother.   Review of Systems:   Please see the history of present illness.     General:  No chills, fever, night sweats or weight changes.  Cardiovascular:  No chest pain, dyspnea on exertion, edema, orthopnea, palpitations, paroxysmal nocturnal dyspnea. Dermatological: No rash, lesions/masses Respiratory: No cough, dyspnea Urologic: No hematuria, dysuria Abdominal:   No nausea, vomiting, diarrhea, bright red blood per rectum, melena, or hematemesis Neurologic:  No visual changes, wkns, changes in mental status.  Positive for dizziness and presyncope.  All other systems reviewed and are otherwise negative except as noted above.   Physical Exam:    VS:  BP (!) 145/105 Comment: automatic bp cuff  Pulse 77   Ht 6\' 2"  (1.88 m)   Wt 291 lb (132 kg)   BMI 37.36 kg/m    General: Well developed, well nourished Caucasian male appearing in  no acute distress. Head: Normocephalic, atraumatic, sclera non-icteric, no xanthomas, nares are without discharge.  Neck: No carotid bruits. JVD not elevated.  Lungs: Respirations regular and unlabored, without wheezes or rales.  Heart: Regular rate and rhythm. No S3 or S4.  No murmur, no rubs, or gallops appreciated. Abdomen: Soft, non-tender, non-distended with normoactive bowel sounds. No hepatomegaly. No rebound/guarding. No obvious abdominal masses. Msk:  Strength and tone appear normal for age. No  joint deformities or effusions. Extremities: No clubbing or cyanosis. Trace lower extremity edema.  Distal pedal pulses are 2+ bilaterally. Neuro: Alert and oriented X 3. Moves all extremities spontaneously. No focal deficits noted. Psych:  Responds to questions appropriately with a normal affect. Skin: No rashes or lesions noted  Wt Readings from Last 3 Encounters:  06/29/17 291 lb (132 kg)  06/16/17 285 lb (129.3 kg)  06/08/17 290 lb 9.6 oz (131.8 kg)     Studies/Labs Reviewed:   EKG:  EKG is not ordered today.    Recent Labs: 07/22/2016: Magnesium 1.9 10/03/2016: ALT 24 06/16/2017: BUN 11; Creatinine, Ser 0.98; Hemoglobin 16.5; Platelets 199; Potassium 3.6; Sodium 137   Lipid Panel    Component Value Date/Time   CHOL 88 06/08/2017 0315   TRIG 308 (H) 06/08/2017 0315   HDL 34 (L) 06/08/2017 0315   CHOLHDL 2.6 06/08/2017 0315   VLDL 62 (H) 06/08/2017 0315   LDLCALC NEG 8 06/08/2017 0315    Additional studies/ records that were reviewed today include:   Echocardiogram: 06/16/2017 Study Conclusions  - Left ventricle: The cavity size was normal. Wall thickness was   increased in a pattern of moderate LVH. Systolic function was   normal. The estimated ejection fraction was in the range of 60%   to 65%. Wall motion was normal; there were no regional wall   motion abnormalities. There was no evidence of elevated   ventricular filling pressure by Doppler parameters. - Aorta:  Ascending aortic diameter: 42 mm (S). - Ascending aorta: The ascending aorta was mildly dilated. - Mitral valve: There was trivial regurgitation. - Left atrium: The atrium was mildly dilated.   NST: 06/08/2017  There was no ST segment deviation noted during stress.  The study is normal.  This is a low risk study.  The left ventricular ejection fraction is normal (55-65%).  Nuclear stress EF calculated as 55% although visually appears mildly reduced. Consider 2D echo to correlate.    Assessment:    1. Coronary artery disease involving native coronary artery of native heart without angina pectoris   2. Essential hypertension   3. Hyperlipidemia LDL goal <70   4. Diastolic dysfunction   5. Type 2 diabetes mellitus with complication, without long-term current use of insulin ([place])      Plan:   In order of problems listed above:  1. CAD - s/p full metal jacket of RCA in 2011, PTCA of RCA in 2011, 2013, and 08/2014 with patent stents by cath in 07/2016 - recent NST showed no significant ischemia and was overall a low-risk study. He denies any repeat anginal symptoms.  - continue Plavix, BB, and Repatha.   2. HTN - BP is at 145/105 on initial check, at 140/86 on recheck. Reports BP has been elevated in the 170's - 180's/90's when checked at home.  - continue Bystolic 20mg  daily (reports this has significantly helped with his palpitations). Will restart Benicar 20mg  daily (on this prior to his recent hospitalization). I have asked him to follow BP at home and call back with results. If still not at goal, would further titrate to 40mg  daily.  - the patient has discussed further testing with his PCP who recommended renal dopplers. Will place order and expedite this process.    3. HLD - Lipid Panel in 10/2016 showed total cholesterol of 94, HDL 29, and LDL 17. At goal of LDL < 70. - continue Repatha.   4. Diastolic Dysfunction  - recent echocardiogram showed  a preserved EF of  60-65%.  - he does not appear volume overloaded by physical examination.  - continue Lasix 40mg  BID.   5. Type 2 DM - followed by PCP.     Medication Adjustments/Labs and Tests Ordered: Current medicines are reviewed at length with the patient today.  Concerns regarding medicines are outlined above.  Medication changes, Labs and Tests ordered today are listed in the Patient Instructions below. Patient Instructions  Medication Instructions:  RESTART Benicar 20mg  take 1 tablet once per day   Labwork: None   Testing/Procedures: Your physician has requested that you have a renal artery duplex. During this test, an ultrasound is used to evaluate blood flow to the kidneys. Allow one hour for this exam. Do not eat after midnight the day before and avoid carbonated beverages. Take your medications as you usually do.  Follow-Up: Your physician recommends that you schedule a follow-up appointment in: 2 months with Dr Sallyanne Kuster. Larena Glassman was Dr Lurline Del patient then switched to Dr Ellyn Hack and wants to go back to Dr C)  Any Other Special Instructions Will Be Listed Below (If Applicable).  If you need a refill on your cardiac medications before your next appointment, please call your pharmacy.    Signed, Erma Heritage, PA-C  06/29/2017 1:12 PM    [facility] [address], [place], [place]  [address] Phone: [phone]; Fax: [phone]  [address] [place], [place] [address] Phone: [phone]

## 2017-06-29 ENCOUNTER — Ambulatory Visit (INDEPENDENT_AMBULATORY_CARE_PROVIDER_SITE_OTHER): Payer: BLUE CROSS/BLUE SHIELD | Admitting: Student

## 2017-06-29 ENCOUNTER — Encounter: Payer: Self-pay | Admitting: Student

## 2017-06-29 VITALS — BP 145/105 | HR 77 | Ht 74.0 in | Wt 291.0 lb

## 2017-06-29 DIAGNOSIS — E118 Type 2 diabetes mellitus with unspecified complications: Secondary | ICD-10-CM | POA: Diagnosis not present

## 2017-06-29 DIAGNOSIS — I519 Heart disease, unspecified: Secondary | ICD-10-CM | POA: Diagnosis not present

## 2017-06-29 DIAGNOSIS — I5189 Other ill-defined heart diseases: Secondary | ICD-10-CM

## 2017-06-29 DIAGNOSIS — I2510 Atherosclerotic heart disease of native coronary artery without angina pectoris: Secondary | ICD-10-CM

## 2017-06-29 DIAGNOSIS — E785 Hyperlipidemia, unspecified: Secondary | ICD-10-CM | POA: Diagnosis not present

## 2017-06-29 DIAGNOSIS — I10 Essential (primary) hypertension: Secondary | ICD-10-CM | POA: Diagnosis not present

## 2017-06-29 MED ORDER — OLMESARTAN MEDOXOMIL 20 MG PO TABS: 20.0000 mg | ORAL_TABLET | Freq: Every day | ORAL | 5 refills | Status: DC

## 2017-06-29 NOTE — Patient Instructions (Addendum)
Medication Instructions:  RESTART Benicar 20mg  take 1 tablet once per day   Labwork: None   Testing/Procedures: Your physician has requested that you have a renal artery duplex. During this test, an ultrasound is used to evaluate blood flow to the kidneys. Allow one hour for this exam. Do not eat after midnight the day before and avoid carbonated beverages. Take your medications as you usually do.  Follow-Up: Your physician recommends that you schedule a follow-up appointment in: 2 months with Dr Sallyanne Kuster. Larena Glassman was Dr Lurline Del patient then switched to Dr Ellyn Hack and wants to go back to Dr C)  Any Other Special Instructions Will Be Listed Below (If Applicable).  If you need a refill on your cardiac medications before your next appointment, please call your pharmacy.

## 2017-07-07 ENCOUNTER — Other Ambulatory Visit: Payer: Self-pay | Admitting: Physician Assistant

## 2017-07-25 ENCOUNTER — Other Ambulatory Visit: Payer: Self-pay | Admitting: Cardiology

## 2017-07-27 ENCOUNTER — Ambulatory Visit (HOSPITAL_COMMUNITY)
Admission: RE | Admit: 2017-07-27 | Discharge: 2017-07-27 | Disposition: A | Discharge status: Home / Self Care | Payer: BLUE CROSS/BLUE SHIELD | Source: Ambulatory Visit | Attending: Cardiovascular Disease | Admitting: Cardiovascular Disease

## 2017-07-27 DIAGNOSIS — I2510 Atherosclerotic heart disease of native coronary artery without angina pectoris: Secondary | ICD-10-CM | POA: Diagnosis not present

## 2017-07-27 DIAGNOSIS — I10 Essential (primary) hypertension: Secondary | ICD-10-CM | POA: Diagnosis not present

## 2017-07-27 NOTE — Telephone Encounter (Signed)
REFILL 

## 2017-08-10 ENCOUNTER — Telehealth: Payer: Self-pay | Admitting: Cardiology

## 2017-08-10 ENCOUNTER — Telehealth: Payer: Self-pay | Admitting: *Deleted

## 2017-08-10 NOTE — Telephone Encounter (Signed)
New Message  Patient is calling in reference to his request to move from being a patient of Dr. Ellyn Hack to Dr. Sallyanne Kuster.    Pt c/o medication issue:  1. Name of Medication: Bystolic 25 and Benicar 20  2. How are you currently taking this medication (dosage and times per day)? Each once a day  3. Are you having a reaction (difficulty breathing--STAT)? No   4. What is your medication issue? Patient states that he takes the benicar at [facility] and then the bystolic a 6pm. In between that every 4 hours he is having to take metoprolol 25 mg to control the pounding heartbeats as well as to control his bp.

## 2017-08-10 NOTE — Telephone Encounter (Signed)
Spoke with patient and he has been having elevated blood pressure and increased palpitations since about 1-2 weeks after visit in November. This is getting worse. Scheduled appointment with Janan Ridge PA for 08/12/17

## 2017-08-10 NOTE — Telephone Encounter (Signed)
Patient is calling in reference to his request to move from being a patient of Dr. Ellyn Hack to Dr. Sallyanne Kuster.    Will forward to Dr Sallyanne Kuster and Dr Ellyn Hack for review

## 2017-08-10 NOTE — Telephone Encounter (Signed)
That is fine with me.  Glenetta Hew, MD

## 2017-08-11 NOTE — Telephone Encounter (Signed)
No objection MCr 

## 2017-08-12 ENCOUNTER — Ambulatory Visit (INDEPENDENT_AMBULATORY_CARE_PROVIDER_SITE_OTHER): Payer: BLUE CROSS/BLUE SHIELD | Admitting: Physician Assistant

## 2017-08-12 ENCOUNTER — Encounter: Payer: Self-pay | Admitting: Physician Assistant

## 2017-08-12 ENCOUNTER — Other Ambulatory Visit: Payer: Self-pay | Admitting: Cardiology

## 2017-08-12 VITALS — BP 132/70 | HR 68 | Ht 74.0 in | Wt 290.0 lb

## 2017-08-12 DIAGNOSIS — R002 Palpitations: Secondary | ICD-10-CM | POA: Diagnosis not present

## 2017-08-12 DIAGNOSIS — R55 Syncope and collapse: Secondary | ICD-10-CM | POA: Diagnosis not present

## 2017-08-12 DIAGNOSIS — E785 Hyperlipidemia, unspecified: Secondary | ICD-10-CM | POA: Diagnosis not present

## 2017-08-12 DIAGNOSIS — I2510 Atherosclerotic heart disease of native coronary artery without angina pectoris: Secondary | ICD-10-CM | POA: Diagnosis not present

## 2017-08-12 DIAGNOSIS — Z8673 Personal history of transient ischemic attack (TIA), and cerebral infarction without residual deficits: Secondary | ICD-10-CM | POA: Diagnosis not present

## 2017-08-12 DIAGNOSIS — E119 Type 2 diabetes mellitus without complications: Secondary | ICD-10-CM

## 2017-08-12 DIAGNOSIS — I10 Essential (primary) hypertension: Secondary | ICD-10-CM | POA: Diagnosis not present

## 2017-08-12 MED ORDER — NEBIVOLOL HCL 20 MG PO TABS: 20.0000 mg | ORAL_TABLET | Freq: Two times a day (BID) | ORAL | 3 refills | Status: DC

## 2017-08-12 NOTE — Progress Notes (Signed)
Cardiology Office Note    Date:  08/14/2017   ID:  Dylan [name], DOB 05-28-1966, MRN [id number]  PCP:  [name], PA  Cardiologist:  Dr. Ellyn Hack --> Wishes to switch to Dr. Sallyanne Kuster   Chief Complaint  Patient presents with  . Palpitations    seen for Dr. Ellyn Hack.   . Chest Pain    History of Present Illness:  Dylan [name] is a 52 y.o. male with PMH of CAD (s/p full metal jacket of RCA 2011, PTCA of RCA in 2011, 2013 and 2016), HTN, HLD, DM II and prior CVA.  Previous 48-hour Holter monitor in June 2016 showed frequent PACs.  His last cardiac catheterization was in December 2017 which showed patent stents.  He was admitted in the hospital in November for chest pain.  A nuclear stress test was pursued, this showed no significant ischemia, overall low risk study.  Outpatient echocardiogram was obtained and showed preserved EF 60-65% with no regional wall motion abnormalities.  He was evaluated again in November for lightheadedness and near syncope.  He did consume candy bar and apple juice prior to EMS arrival.  His systolic blood pressure initially was elevated at 220 however came back down to normal limits in the ED.  Urinalysis showed a glucose level greater than 500.  During the last office visit, his blood pressure remained in the 170s-180s.  He his Benicar was stopped during recent hospitalization and he was started on losartan 25 mg daily.  Recent renal arteries Doppler obtained in December 2018 showed no significant stenosis.  Presents today for cardiology office visit.  He continued to have almost daily episode of presyncope.  He also tend to wake up at night with palpitation in the chest.  This palpitation episodes can sometimes occur when he is just sitting in the chair without doing anything.  The palpitation usually does not occur when he is walking around or exerting himself. He says it is not chest pain, but palpitation only. He denies any exertional chest pain or  shortness of breath.  He has chronic lower extremity 2+ pitting edema, this is worse the longer he stands on his feet.  He is currently on 40 mg twice daily of Lasix.  I think some of his lower extremity edema likely is contributed by the venous insufficiency, therefore I recommend continue the current dose of Lasix.  I am more worried about the increased episodes of palpitation and also presyncope.  I will increase his Bystolic to 20 mg twice daily and have him wear a 48-hour Holter monitor to assess for irregular rhythm.  He can follow-up with Dr. Sallyanne Kuster in 2 months.   Past Medical History:  Diagnosis Date  . CAD S/P percutaneous coronary angioplasty 07/2010; 07/2012; 08/2014   1) 2/'11: MI - 100% RCA - PCI (3 Taxus ION DES 3.0 x 28, 3.0 x 24 & 3.5 x 12 distal-prox); b) ISR of RCA stent - -AnngioSculpt PTCA; c) NSTEMI 1/'16: mild ISR with Thrombosis of RCA Stents (in setting of Mesenteric Vein Thrombosis) - Cutting Balloon PTCA; c. Re-look Cath 07/2015 & 07/2016: ~15% ISR in RCA,20% p-mLAD & o-pCx.  . Coronary stent thrombosis 08/15/2014  . DM type 2 (diabetes mellitus, type 2) ([place]) 06/07/2017  . Essential hypertension 07/07/2012  . Finding of multiple premature atrial contractions by electrocardiography    Symptomatic palpitations ; worse with poor sleep and fatigue along with dehydration.   . Hyperlipidemia with target LDL less than 70 09/01/2014  .  Mesenteric vein thrombosis   . Migraines    "maybe one/yr" (07/06/2012)  . NSTEMI (non-ST elevated myocardial infarction) ([place]) 07/2010, 08/2014   And unstable angina in February 20 13th; Echo 1/12/'16: Moderate concentric LVH. EF 60-65% with no regional WMA. Gr 1 DD, otherwise normal  . Stroke ([place]) 2011; 2013   S/P cardiac cath - embolic stroke; denies residual (07/06/2012)  . 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 04/19/2013   No evidence of CVA on  MRI/MRA & Dliated Eye Examination. ruptured blood vessel & not occlusion.    Past Surgical History:  Procedure Laterality Date  . BOWEL RESECTION N/A 08/28/2014   Procedure: SMALL BOWEL RESECTION;  Surgeon: [name], MD;  Location: [facility];  Service: General;  Laterality: N/A;  . CARDIAC CATHETERIZATION  04/18/2013  . CARDIAC CATHETERIZATION N/A 07/20/2015   Procedure: Left Heart Cath and Coronary Angiography;  Surgeon: [name], MD;  Location: [facility];  Service: Cardiovascular;  15% ISR in the RCA stent segment, 20% proximal-mid LAD as well as ostial and proximal circumflex. Normal LV function.  Marland Kitchen CARDIAC CATHETERIZATION N/A 07/23/2016   Procedure: Left Heart Cath and Coronary Angiography;  Surgeon: [name], MD;  Location: [facility];  Service: Cardiovascular: E 55-65%. ~15% diffuse RCA ISR, ~20% diffuse pLAD & pCx  . CORONARY ANGIOPLASTY  07/06/2012; 08/15/2014   a) 12/'13: PTCA of 80% ISR mRCA - AngioSculpt; b) PTCA of  ISR/thrombosis  . CORONARY ANGIOPLASTY WITH STENT PLACEMENT  07/2010   inferior wall MI - 3 Taxus Ion DES (3.0x60mm, 3.0x62mm, 3.5x48mm) to prox and mid RCA  . LAPAROSCOPIC APPENDECTOMY N/A 08/28/2014   Procedure: APPENDECTOMY LAPAROSCOPIC;  Surgeon: [name], MD;  Location: [facility];  Service: General;  Laterality: N/A;  . LAPAROSCOPY  08/28/2014   Procedure: LAPAROSCOPY DIAGNOSTIC;  Surgeon: [name], MD;  Location: [facility];  Service: General;;  . LEFT HEART CATHETERIZATION WITH CORONARY ANGIOGRAM N/A 06/11/2012   Procedure: LEFT HEART CATHETERIZATION WITH CORONARY ANGIOGRAM;  Surgeon: [name], MD;  Location: [facility];  Service: Cardiovascular;  Laterality: N/A;  . LEFT HEART CATHETERIZATION WITH CORONARY ANGIOGRAM N/A 04/18/2013   Procedure: LEFT HEART CATHETERIZATION WITH CORONARY ANGIOGRAM;  Surgeon: [name], MD;  Location: [facility] CATH LAB;  Service: Cardiovascular;  Laterality: N/A;  . LEFT HEART CATHETERIZATION  WITH CORONARY ANGIOGRAM N/A 08/15/2014   Procedure: LEFT HEART CATHETERIZATION WITH CORONARY ANGIOGRAM;  Surgeon: [name], MD;  Location: [facility] CATH LAB;  Service: Cardiovascular;  Laterality: N/A;  . PERCUTANEOUS CORONARY STENT INTERVENTION (PCI-S) N/A 07/06/2012   Procedure: PERCUTANEOUS CORONARY STENT INTERVENTION (PCI-S);  Surgeon: [name], MD;  Location: [facility] CATH LAB;  Service: Cardiovascular;  Laterality: N/A;  . SEPTOPLASTY  2001  . TRANSTHORACIC ECHOCARDIOGRAM  08/15/2014   Moderate concentric LVH. EF 60-65% with no regional WMA. Gr 1 DD, otherwise normal    Current Medications: Outpatient Medications Prior to Visit  Medication Sig Dispense Refill  . clopidogrel (PLAVIX) 75 MG tablet TAKE 1 TABLET(75 MG) BY MOUTH DAILY WITH BREAKFAST 90 tablet 2  . Empagliflozin-Metformin HCl ER (SYNJARDY XR) 25-1000 MG TB24 Take 1 tablet by mouth daily.    . Evolocumab (REPATHA SURECLICK) 160 MG/ML SOAJ Inject 140 mg into the skin every 14 (fourteen) days. 2 pen 11  . furosemide (LASIX) 40 MG tablet TAKE 1 TABLET(40 MG) BY MOUTH TWICE  DAILY 180 tablet 1  . ibuprofen (ADVIL,MOTRIN) 200 MG tablet Take 600 mg by mouth every 6 (six) hours as needed for headache or moderate pain.     . nitroGLYCERIN (NITROSTAT) 0.4 MG SL tablet Place 1 tablet (0.4 mg total) under the tongue every 5 (five) minutes x 3 doses as needed for chest pain. 25 tablet 2  . olmesartan (BENICAR) 20 MG tablet Take 1 tablet (20 mg total) by mouth daily. 30 tablet 5  . Polyethyl Glycol-Propyl Glycol (SYSTANE OP) Place 1 drop into both eyes daily as needed (dry eyes).    . Nebivolol HCl (BYSTOLIC) 20 MG TABS Take 1 tablet (20 mg total) by mouth every morning. (Patient taking differently: Take 20 mg by mouth daily. ) 90 tablet 1  . Febuxostat (ULORIC) 80 MG TABS Take 80 mg by mouth daily.     No facility-administered medications prior to visit.      Allergies:   Crestor [rosuvastatin]; Zofran [ondansetron hcl]; Ace inhibitors;  Lipitor [atorvastatin]; and Codeine   Social History   Socioeconomic History  . Marital status: Married    Spouse name: None  . Number of children: 2  . Years of education: 16  . Highest education level: None  Social Needs  . Financial resource strain: None  . Food insecurity - worry: None  . Food insecurity - inability: None  . Transportation needs - medical: None  . Transportation needs - non-medical: None  Occupational History  . Occupation: Pharmacologist: [employer]  Tobacco Use  . Smoking status: Former Smoker    Packs/day: 1.00    Years: 15.00    Pack years: 15.00    Types: Cigarettes    Last attempt to quit: 06/30/2010    Years since quitting: 7.1  . Smokeless tobacco: Current User    Types: Snuff    Last attempt to quit: 06/30/2010  Substance and Sexual Activity  . Alcohol use: Yes    Alcohol/week: 4.2 oz    Types: 7 Shots of liquor per week    Comment: a  glass of scotch per day  . Drug use: No  . Sexual activity: Yes  Other Topics Concern  . None  Social History Narrative  . None     Family History:  The patient's family history includes Atrial fibrillation in his mother; Coronary artery disease in his father; Heart attack in his father, paternal grandfather, and paternal uncle; Mitral valve prolapse in his mother.   ROS:   Please see the history of present illness.    ROS All other systems reviewed and are negative.   PHYSICAL EXAM:   VS:  BP 132/70   Pulse 68   Ht 6\' 2"  (1.88 m)   Wt 290 lb (131.5 kg)   BMI 37.23 kg/m    GEN: Well nourished, well developed, in no acute distress  HEENT: normal  Neck: no JVD, carotid bruits, or masses Cardiac: RRR; no murmurs, rubs, or gallops. 2+ LE edema  Respiratory:  clear to auscultation bilaterally, normal work of breathing GI: soft, nontender, nondistended, + BS MS: no deformity or atrophy  Skin: warm and dry, no rash Neuro:  Alert and Oriented x 3, Strength and sensation are intact Psych:  euthymic mood, full affect  Wt Readings from Last 3 Encounters:  08/12/17 290 lb (131.5 kg)  06/29/17 291 lb (132 kg)  06/16/17 285 lb (129.3 kg)      Studies/Labs Reviewed:   EKG:  EKG is  not ordered today.   Recent Labs: 10/03/2016: ALT 24 06/16/2017: BUN 11; Creatinine, Ser 0.98; Hemoglobin 16.5; Platelets 199; Potassium 3.6; Sodium 137   Lipid Panel    Component Value Date/Time   CHOL 88 06/08/2017 0315   TRIG 308 (H) 06/08/2017 0315   HDL 34 (L) 06/08/2017 0315   CHOLHDL 2.6 06/08/2017 0315   VLDL 62 (H) 06/08/2017 0315   LDLCALC NEG 8 06/08/2017 0315    Additional studies/ records that were reviewed today include:   Echo 06/16/2017 LV EF: 60% -   65%  Study Conclusions  - Left ventricle: The cavity size was normal. Wall thickness was   increased in a pattern of moderate LVH. Systolic function was   normal. The estimated ejection fraction was in the range of 60%   to 65%. Wall motion was normal; there were no regional wall   motion abnormalities. There was no evidence of elevated   ventricular filling pressure by Doppler parameters. - Aorta: Ascending aortic diameter: 42 mm (S). - Ascending aorta: The ascending aorta was mildly dilated. - Mitral valve: There was trivial regurgitation. - Left atrium: The atrium was mildly dilated.   ASSESSMENT:    1. Palpitation   2. Pre-syncope   3. Coronary artery disease involving native coronary artery of native heart without angina pectoris   4. Essential hypertension   5. Hyperlipidemia, unspecified hyperlipidemia type   6. Controlled type 2 diabetes mellitus without complication, without long-term current use of insulin ([place])   7. H/O: CVA (cerebrovascular accident)      PLAN:  In order of problems listed above:  1. Palpitation: Recently he has been having almost daily episode of palpitation and presyncope.  I recommended increasing Nebivilol to 20 mg twice a day.  I also recommended a 48-hour Holter monitor as  well.  2. CAD: Last cardiac catheterization December 2017, nonobstructive disease with 15% proximal to distal RCA, 20% proximal to mid LAD, 20% ostial left circumflex.  Medical therapy.  3. Hypertension: Blood pressure borderline elevated.  Increasing nebivolol to 20 mg twice daily to help control palpitation  4. Hyperlipidemia: On Repatha  5. DM 2: Managed by primary care provider  6. History of CVA: No recent recurrence    Medication Adjustments/Labs and Tests Ordered: Current medicines are reviewed at length with the patient today.  Concerns regarding medicines are outlined above.  Medication changes, Labs and Tests ordered today are listed in the Patient Instructions below. Patient Instructions  Medication Instructions:  INCREASE- Nebivolol 20 mg twice a day  If you need a refill on your cardiac medications before your next appointment, please call your pharmacy.  Labwork: None Ordered   Testing/Procedures: Your physician has recommended that you wear a 48 hour holter monitor. Holter monitors are medical devices that record the heart's electrical activity. Doctors most often use these monitors to diagnose arrhythmias. Arrhythmias are problems with the speed or rhythm of the heartbeat. The monitor is a small, portable device. You can wear one while you do your normal daily activities. This is usually used to diagnose what is causing palpitations/syncope (passing out).   Follow-Up: Your physician wants you to follow-up in: 2 Months with Dr Sallyanne Kuster.   Thank you for choosing [facility] at [facility], [place]  08/14/2017 7:07 AM    [address], [place], [place]  [address] Phone: [phone]; Fax: [phone]

## 2017-08-12 NOTE — Patient Instructions (Addendum)
Medication Instructions:  INCREASE- Nebivolol 20 mg twice a day  If you need a refill on your cardiac medications before your next appointment, please call your pharmacy.  Labwork: None Ordered   Testing/Procedures: Your physician has recommended that you wear a 48 hour holter monitor. Holter monitors are medical devices that record the heart's electrical activity. Doctors most often use these monitors to diagnose arrhythmias. Arrhythmias are problems with the speed or rhythm of the heartbeat. The monitor is a small, portable device. You can wear one while you do your normal daily activities. This is usually used to diagnose what is causing palpitations/syncope (passing out).   Follow-Up: Your physician wants you to follow-up in: 2 Months with Dr Sallyanne Kuster.   Thank you for choosing [facility] at [facility]!!

## 2017-08-13 ENCOUNTER — Telehealth: Payer: Self-pay | Admitting: Cardiovascular Disease

## 2017-08-13 NOTE — Telephone Encounter (Signed)
New message   Patient calling with questions about prior auth for Repatha. Please call   Pt c/o medication issue:  1. Name of Medication:Evolocumab (REPATHA SURECLICK) 158 MG/ML SOAJ  2. How are you currently taking this medication (dosage and times per day)? As prescribed  3. Are you having a reaction (difficulty breathing--STAT)? No 4. What is your medication issue?  Pre auth question

## 2017-08-14 ENCOUNTER — Encounter: Payer: Self-pay | Admitting: Physician Assistant

## 2017-08-17 ENCOUNTER — Other Ambulatory Visit: Payer: Self-pay | Admitting: Pharmacist Clinician (PhC)/ Clinical Pharmacy Specialist

## 2017-08-17 DIAGNOSIS — E785 Hyperlipidemia, unspecified: Secondary | ICD-10-CM

## 2017-08-17 NOTE — Telephone Encounter (Signed)
Spoke with patient, his PA for Repatha has expired in December.  Was to have taken last dose about 1 week ago.   Will give patient 2 samples while we get PA re-authorized.

## 2017-08-18 ENCOUNTER — Ambulatory Visit (INDEPENDENT_AMBULATORY_CARE_PROVIDER_SITE_OTHER): Payer: BLUE CROSS/BLUE SHIELD

## 2017-08-18 DIAGNOSIS — R55 Syncope and collapse: Secondary | ICD-10-CM | POA: Diagnosis not present

## 2017-08-18 DIAGNOSIS — R002 Palpitations: Secondary | ICD-10-CM | POA: Diagnosis not present

## 2017-08-25 DIAGNOSIS — E785 Hyperlipidemia, unspecified: Secondary | ICD-10-CM | POA: Diagnosis not present

## 2017-08-25 LAB — LIPID PANEL
CHOLESTEROL TOTAL: 123 mg/dL (ref 100–199)
Chol/HDL Ratio: 3.6 ratio (ref 0.0–5.0)
HDL: 34 mg/dL — ABNORMAL LOW (ref >39)
LDL CALC: 16 mg/dL (ref 0–99)
TRIGLYCERIDES: 363 mg/dL — AB (ref 0–149)
VLDL CHOLESTEROL CAL: 73 mg/dL — AB (ref 5–40)

## 2017-08-26 ENCOUNTER — Other Ambulatory Visit: Payer: Self-pay | Admitting: Cardiology

## 2017-08-26 NOTE — Telephone Encounter (Signed)
She needs prior authorization for pt's Repatha please.

## 2017-08-27 NOTE — Telephone Encounter (Signed)
Prior auth started

## 2017-09-03 DIAGNOSIS — R040 Epistaxis: Secondary | ICD-10-CM | POA: Diagnosis not present

## 2017-09-07 ENCOUNTER — Other Ambulatory Visit: Payer: Self-pay | Admitting: Pharmacist Clinician (PhC)/ Clinical Pharmacy Specialist

## 2017-09-07 MED ORDER — EVOLOCUMAB 140 MG/ML ~~LOC~~ SOAJ: 140.0000 mg | SUBCUTANEOUS | 3 refills | Status: DC

## 2017-09-19 ENCOUNTER — Other Ambulatory Visit: Payer: Self-pay

## 2017-09-19 ENCOUNTER — Emergency Department (HOSPITAL_COMMUNITY)
Admission: EM | Admit: 2017-09-19 | Discharge: 2017-09-19 | Disposition: A | Discharge status: Home / Self Care | Payer: BLUE CROSS/BLUE SHIELD | Attending: Emergency Medicine | Admitting: Emergency Medicine

## 2017-09-19 ENCOUNTER — Emergency Department (HOSPITAL_BASED_OUTPATIENT_CLINIC_OR_DEPARTMENT_OTHER)
Admit: 2017-09-19 | Discharge: 2017-09-19 | Disposition: A | Discharge status: Home / Self Care | Payer: BLUE CROSS/BLUE SHIELD | Attending: Emergency Medicine | Admitting: Emergency Medicine

## 2017-09-19 ENCOUNTER — Encounter (HOSPITAL_COMMUNITY): Payer: Self-pay

## 2017-09-19 DIAGNOSIS — Z8673 Personal history of transient ischemic attack (TIA), and cerebral infarction without residual deficits: Secondary | ICD-10-CM | POA: Insufficient documentation

## 2017-09-19 DIAGNOSIS — Z87891 Personal history of nicotine dependence: Secondary | ICD-10-CM | POA: Diagnosis not present

## 2017-09-19 DIAGNOSIS — M7989 Other specified soft tissue disorders: Secondary | ICD-10-CM | POA: Diagnosis not present

## 2017-09-19 DIAGNOSIS — Z79899 Other long term (current) drug therapy: Secondary | ICD-10-CM | POA: Insufficient documentation

## 2017-09-19 DIAGNOSIS — I8000 Phlebitis and thrombophlebitis of superficial vessels of unspecified lower extremity: Secondary | ICD-10-CM

## 2017-09-19 DIAGNOSIS — I2510 Atherosclerotic heart disease of native coronary artery without angina pectoris: Secondary | ICD-10-CM | POA: Insufficient documentation

## 2017-09-19 DIAGNOSIS — I10 Essential (primary) hypertension: Secondary | ICD-10-CM | POA: Diagnosis not present

## 2017-09-19 DIAGNOSIS — E119 Type 2 diabetes mellitus without complications: Secondary | ICD-10-CM | POA: Diagnosis not present

## 2017-09-19 DIAGNOSIS — I80299 Phlebitis and thrombophlebitis of other deep vessels of unspecified lower extremity: Secondary | ICD-10-CM | POA: Diagnosis not present

## 2017-09-19 DIAGNOSIS — I8002 Phlebitis and thrombophlebitis of superficial vessels of left lower extremity: Secondary | ICD-10-CM | POA: Diagnosis not present

## 2017-09-19 LAB — CBC
HEMATOCRIT: 49.2 % (ref 39.0–52.0)
Hemoglobin: 16.4 g/dL (ref 13.0–17.0)
MCH: 30 pg (ref 26.0–34.0)
MCHC: 33.3 g/dL (ref 30.0–36.0)
MCV: 90.1 fL (ref 78.0–100.0)
Platelets: 219 10*3/uL (ref 150–400)
RBC: 5.46 MIL/uL (ref 4.22–5.81)
RDW: 14.1 % (ref 11.5–15.5)
WBC: 5.7 10*3/uL (ref 4.0–10.5)

## 2017-09-19 LAB — BASIC METABOLIC PANEL
Anion gap: 12 (ref 5–15)
BUN: 17 mg/dL (ref 6–20)
CHLORIDE: 102 mmol/L (ref 101–111)
CO2: 25 mmol/L (ref 22–32)
Calcium: 9.5 mg/dL (ref 8.9–10.3)
Creatinine, Ser: 1.18 mg/dL (ref 0.61–1.24)
GFR calc Af Amer: >60 mL/min (ref >60)
GLUCOSE: 118 mg/dL — AB (ref 65–99)
Potassium: 3.9 mmol/L (ref 3.5–5.1)
Sodium: 139 mmol/L (ref 135–145)

## 2017-09-19 NOTE — Progress Notes (Signed)
LLE venous duplex prelim: negative for DVT and SVT. Landry Mellow, RDMS, RVT

## 2017-09-19 NOTE — ED Triage Notes (Signed)
Patient complains of left lower leg pain with swelling x 4 days. States that he can feel knot in same. Denies trauma. No SOB. Request to be checked for DVT,no hx of same

## 2017-09-19 NOTE — ED Notes (Signed)
Pt taken to vascular

## 2017-09-19 NOTE — ED Provider Notes (Signed)
[facility] EMERGENCY DEPARTMENT Provider Note   CSN: [id number] Arrival date & time: 09/19/17  1003     History   Chief Complaint Chief Complaint  Patient presents with  . Leg Pain    HPI Dylan [name] is a 52 y.o. male.  HPI Patient is at 3-4 days of left calf swelling and pain.  Denies any known injury.  No chest pain or shortness of breath.  Patient states he sits for long periods of time at his work but no recent extended travel or immobilization.  No previous history of PE/DVT. Past Medical History:  Diagnosis Date  . CAD S/P percutaneous coronary angioplasty 07/2010; 07/2012; 08/2014   1) 2/'11: MI - 100% RCA - PCI (3 Taxus ION DES 3.0 x 28, 3.0 x 24 & 3.5 x 12 distal-prox); b) ISR of RCA stent - -AnngioSculpt PTCA; c) NSTEMI 1/'16: mild ISR with Thrombosis of RCA Stents (in setting of Mesenteric Vein Thrombosis) - Cutting Balloon PTCA; c. Re-look Cath 07/2015 & 07/2016: ~15% ISR in RCA,20% p-mLAD & o-pCx.  . Coronary stent thrombosis 08/15/2014  . DM type 2 (diabetes mellitus, type 2) ([place]) 06/07/2017  . Essential hypertension 07/07/2012  . Finding of multiple premature atrial contractions by electrocardiography    Symptomatic palpitations ; worse with poor sleep and fatigue along with dehydration.   . Hyperlipidemia with target LDL less than 70 09/01/2014  . Mesenteric vein thrombosis   . Migraines    "maybe one/yr" (07/06/2012)  . NSTEMI (non-ST elevated myocardial infarction) ([place]) 07/2010, 08/2014   And unstable angina in February 20 13th; Echo 1/12/'16: Moderate concentric LVH. EF 60-65% with no regional WMA. Gr 1 DD, otherwise normal  . Stroke ([place]) 2011; 2013   S/P cardiac cath - embolic stroke; denies residual (07/06/2012)  . 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 04/19/2013   No evidence of CVA on MRI/MRA & Dliated Eye Examination. ruptured blood  vessel & not occlusion.    Patient Active Problem List   Diagnosis Date Noted  . DM type 2 (diabetes mellitus, type 2) ([place]) 06/07/2017  . Coronary artery disease involving native coronary artery of native heart   . Chest pain on exertion 06/06/2017  . Medication management 09/04/2016  . Diastolic dysfunction 72/53/6644  . Finding of multiple premature atrial contractions by electrocardiography 10/05/2015  . Iron deficiency anemia 09/29/2014  . Obesity (BMI 30-39.9) 09/09/2014  . Perforation bowel - 2/2 Mesenteric Ischemia from Mesenteric Vein Thrombosis.   . Intestinal perforation ([place]) 08/26/2014  . Coronary stent thrombosis 08/15/2014  . Acute embolism and thrombosis of vein   . Mesenteric vein thrombosis   . Superior mesenteric vein thrombosis ([place]) 08/05/2014  . Portal vein thrombosis 08/05/2014  . 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 04/19/2013  . Possible Retinal artery branch occlusion of right eye = POST CATHETERIZATION -- by dilated Eye exam - no evidence of embolic occlusoin; possible microperforation. NOT CVA 04/19/2013    Class: Acute  . Hypertriglyceridemia - TG >500 04/19/2013  . Dyslipidemia, statin intol 07/07/2012  . Essential hypertension 07/07/2012  . Smoker, quit 06/30/10 07/07/2012  . CAD S/P PCI with 3 DES stents to RCA and PTCA x2 for ISR and thrombosis  07/06/2012  . H/O: CVA (cerebrovascular accident), post cardiac cath, minimal  speech residual 11/11 post PCI 07/06/2012  . NSTEMI (non-ST elevated myocardial infarction) - 07/2010, 2013, 08/2014 07/04/2010    Past Surgical History:  Procedure Laterality Date  . BOWEL RESECTION N/A 08/28/2014   Procedure: SMALL BOWEL RESECTION;  Surgeon: [name], MD;  Location: [facility];  Service: General;  Laterality: N/A;  . CARDIAC CATHETERIZATION  04/18/2013  . CARDIAC CATHETERIZATION N/A 07/20/2015   Procedure:  Left Heart Cath and Coronary Angiography;  Surgeon: [name], MD;  Location: [facility] CV LAB;  Service: Cardiovascular;  15% ISR in the RCA stent segment, 20% proximal-mid LAD as well as ostial and proximal circumflex. Normal LV function.  Marland Kitchen CARDIAC CATHETERIZATION N/A 07/23/2016   Procedure: Left Heart Cath and Coronary Angiography;  Surgeon: [name], MD;  Location: [facility] CV LAB;  Service: Cardiovascular: E 55-65%. ~15% diffuse RCA ISR, ~20% diffuse pLAD & pCx  . CORONARY ANGIOPLASTY  07/06/2012; 08/15/2014   a) 12/'13: PTCA of 80% ISR mRCA - AngioSculpt; b) PTCA of  ISR/thrombosis  . CORONARY ANGIOPLASTY WITH STENT PLACEMENT  07/2010   inferior wall MI - 3 Taxus Ion DES (3.0x70mm, 3.0x65mm, 3.5x100mm) to prox and mid RCA  . LAPAROSCOPIC APPENDECTOMY N/A 08/28/2014   Procedure: APPENDECTOMY LAPAROSCOPIC;  Surgeon: [name], MD;  Location: [facility];  Service: General;  Laterality: N/A;  . LAPAROSCOPY  08/28/2014   Procedure: LAPAROSCOPY DIAGNOSTIC;  Surgeon: [name], MD;  Location: [facility];  Service: General;;  . LEFT HEART CATHETERIZATION WITH CORONARY ANGIOGRAM N/A 06/11/2012   Procedure: LEFT HEART CATHETERIZATION WITH CORONARY ANGIOGRAM;  Surgeon: [name], MD;  Location: [facility] CATH LAB;  Service: Cardiovascular;  Laterality: N/A;  . LEFT HEART CATHETERIZATION WITH CORONARY ANGIOGRAM N/A 04/18/2013   Procedure: LEFT HEART CATHETERIZATION WITH CORONARY ANGIOGRAM;  Surgeon: [name], MD;  Location[facility] C[facility] LAB;  Service: Cardiovascular;  Laterality: N/A;  . LEFT HEART CATHETERIZATION WITH CORONARY ANGIOGRAM N/A 08/15/2014   Procedure: LEFT HEART CATHETERIZATION WITH CORONARY ANGIOGRAM;  Surgeon: [name], MD;  Location: [facility] CATH LAB;  Service: Cardiovascular;  Laterality: N/A;  . PERCUTANEOUS CORONARY STENT INTERVENTION (PCI-S) N/A 07/06/2012   Procedure: PERCUTANEOUS CORONARY STENT INTERVENTION (PCI-S);  Surgeon: [name], MD;  Location: [facility] CATH  LAB;  Service: Cardiovascular;  Laterality: N/A;  . SEPTOPLASTY  2001  . TRANSTHORACIC ECHOCARDIOGRAM  08/15/2014   Moderate concentric LVH. EF 60-65% with no regional WMA. Gr 1 DD, otherwise normal       Home Medications    Prior to Admission medications   Medication Sig Start Date End Date Taking? Authorizing Provider  clopidogrel (PLAVIX) 75 MG tablet TAKE 1 TABLET(75 MG) BY MOUTH DAILY WITH BREAKFAST 07/27/17  Yes [name], MD  Empagliflozin-Metformin HCl ER (SYNJARDY XR) 25-1000 MG TB24 Take 1 tablet by mouth daily.   Yes [provider]  Evolocumab (REPATHA SURECLICK) 606 MG/ML SOAJ Inject 140 mg into the skin every 14 (fourteen) days. 09/07/17  Yes [name], MD  furosemide (LASIX) 40 MG tablet TAKE 1 TABLET(40 MG) BY MOUTH TWICE DAILY 05/21/17  Yes [name], MD  ibuprofen (ADVIL,MOTRIN) 200 MG tablet Take 600 mg by mouth every 6 (six) hours as needed for headache or moderate pain.    Yes [provider]  metoprolol tartrate (LOPRESSOR) 25 MG tablet Daily as NEEDED for Palpitations 08/12/17  Yes Almyra Deforest, PA  Nebivolol HCl (BYSTOLIC) 20 MG TABS Take 1 tablet (20 mg total) by mouth 2 (two) times  daily. 08/12/17  Yes Eulas Post, Isaac Laud, PA  olmesartan (BENICAR) 20 MG tablet Take 1 tablet (20 mg total) by mouth daily. 06/29/17  Yes Strader, [place] M, PA-C  Polyethyl Glycol-Propyl Glycol (SYSTANE OP) Place 1 drop into both eyes daily as needed (dry eyes).   Yes [provider]  Febuxostat (ULORIC) 80 MG TABS Take 80 mg by mouth daily.    [provider]  nitroGLYCERIN (NITROSTAT) 0.4 MG SL tablet Place 1 tablet (0.4 mg total) under the tongue every 5 (five) minutes x 3 doses as needed for chest pain. 10/03/15   [name], MD    Family History Family History  Problem Relation Age of Onset  . Atrial fibrillation Mother   . Mitral valve prolapse Mother   . Coronary artery disease Father        CABG, aortic aneursym,  . Heart attack  Father   . Heart attack Paternal Uncle   . Heart attack Paternal Grandfather     Social History Social History   Tobacco Use  . Smoking status: Former Smoker    Packs/day: 1.00    Years: 15.00    Pack years: 15.00    Types: Cigarettes    Last attempt to quit: 06/30/2010    Years since quitting: 7.2  . Smokeless tobacco: Current User    Types: Snuff    Last attempt to quit: 06/30/2010  Substance Use Topics  . Alcohol use: Yes    Alcohol/week: 4.2 oz    Types: 7 Shots of liquor per week    Comment: a  glass of scotch per day  . Drug use: No     Allergies   Crestor [rosuvastatin]; Zofran [ondansetron hcl]; Ace inhibitors; Lipitor [atorvastatin]; and Codeine   Review of Systems Review of Systems  Constitutional: Negative for chills and fever.  Respiratory: Negative for cough and shortness of breath.   Cardiovascular: Positive for leg swelling. Negative for chest pain and palpitations.  Gastrointestinal: Negative for abdominal pain, constipation, diarrhea, nausea and vomiting.  Musculoskeletal: Positive for myalgias. Negative for back pain, neck pain and neck stiffness.  Skin: Negative for rash and wound.  Neurological: Negative for dizziness, weakness, light-headedness, numbness and headaches.  All other systems reviewed and are negative.    Physical Exam Updated Vital Signs BP 114/73   Pulse 62   Temp 98.7 F (37.1 C) (Oral)   Resp 16   SpO2 97%   Physical Exam  Constitutional: He is oriented to person, place, and time. He appears well-developed and well-nourished. No distress.  HENT:  Head: Normocephalic and atraumatic.  Eyes: EOM are normal. Pupils are equal, round, and reactive to light.  Neck: Normal range of motion. Neck supple.  Cardiovascular: Normal rate and regular rhythm. Exam reveals no gallop and no friction rub.  No murmur heard. Pulmonary/Chest: Effort normal and breath sounds normal. No stridor. No respiratory distress. He has no wheezes.    Abdominal: Soft. Bowel sounds are normal. There is no tenderness. There is no rebound and no guarding.  Musculoskeletal: Normal range of motion. He exhibits tenderness. He exhibits no edema.  Patient has some tenderness to the inferior border of his left calf.  Superficial thrombus is appreciated.  Mild warmth.  Distal pulses are 2+.  Neurological: He is alert and oriented to person, place, and time.  Moves all extremities without focal deficit.  Sensation intact.  Skin: Skin is warm and dry. Capillary refill takes less than 2 seconds. No rash noted. He is  not diaphoretic. No erythema.  Psychiatric: He has a normal mood and affect. His behavior is normal.  Nursing note and vitals reviewed.    ED Treatments / Results  Labs (all labs ordered are listed, but only abnormal results are displayed) Labs Reviewed  BASIC METABOLIC PANEL - Abnormal; Notable for the following components:      Result Value   Glucose, Bld 118 (*)    All other components within normal limits  CBC    EKG  EKG Interpretation None       Radiology  Procedures Procedures (including critical care time)  Medications Ordered in ED Medications - No data to display   Initial Impression / Assessment and Plan / ED Course  I have reviewed the triage vital signs and the nursing notes.  Pertinent labs & imaging results that were available during my care of the patient were reviewed by me and considered in my medical decision making (see chart for details).     Doppler ultrasound of the left leg without acute findings.  Suspect superficial thrombophlebitis.  Encouraged warm compresses and NSAIDs as well as compression stockings.  Return precautions given.  Final Clinical Impressions(s) / ED Diagnoses   Final diagnoses:  Superficial thrombophlebitis of lower extremity, unspecified laterality    ED Discharge Orders    None       Julianne Rice, MD 09/19/17 2142

## 2017-10-06 ENCOUNTER — Telehealth: Payer: Self-pay | Admitting: *Deleted

## 2017-10-06 ENCOUNTER — Other Ambulatory Visit: Payer: Self-pay | Admitting: Surgery

## 2017-10-06 DIAGNOSIS — K432 Incisional hernia without obstruction or gangrene: Secondary | ICD-10-CM | POA: Diagnosis not present

## 2017-10-06 NOTE — Telephone Encounter (Signed)
   [facility] Pre-operative Risk Assessment    Request for surgical clearance:  1. What type of surgery is being performed? Incisional hernia repair   2. When is this surgery scheduled? TBD   3. What type of clearance is required (medical clearance vs. Pharmacy clearance to hold med vs. Both)? Both  4. Are there any medications that need to be held prior to surgery and how long?Plavix   5. Practice name and name of physician performing surgery? [facility]   6. What is your office phone and fax number? [phone] attn: Malachi Bonds CMA   7. Anesthesia type (None, local, MAC, general) ? general   Dylan [name] A Dylan [name] 10/06/2017, 2:17 PM  _________________________________________________________________   (provider comments below)  OV with Dr. Sallyanne Kuster 3/7

## 2017-10-07 NOTE — Telephone Encounter (Signed)
   Primary Cardiologist:Mihai Croitoru, MD  Chart reviewed as part of pre-operative protocol coverage. Because of Dylan [name] past medical history and time since last visit, he/she will require a follow-up visit in order to better assess preoperative cardiovascular risk. Last OV 08/2017 with medication adjustments made; recommended to f/u 2 months. Has appointment 3/7 with Dr. Sallyanne Kuster at which time fitness for procedure can be discussed. Will cc to Dr. Sallyanne Kuster to make him aware to please route finalized note/clearance with Plavix recs to requesting party.   Will route this bundled recommendation to requesting provider via Epic fax function. Please call with questions.  Charlie Pitter, PA-C  10/07/2017, 3:49 PM

## 2017-10-08 ENCOUNTER — Encounter: Payer: Self-pay | Admitting: Cardiovascular Disease

## 2017-10-08 ENCOUNTER — Ambulatory Visit (INDEPENDENT_AMBULATORY_CARE_PROVIDER_SITE_OTHER): Payer: BLUE CROSS/BLUE SHIELD | Admitting: Cardiovascular Disease

## 2017-10-08 VITALS — BP 126/83 | HR 65 | Ht 74.0 in | Wt 284.8 lb

## 2017-10-08 DIAGNOSIS — Z01810 Encounter for preprocedural cardiovascular examination: Secondary | ICD-10-CM

## 2017-10-08 DIAGNOSIS — I491 Atrial premature depolarization: Secondary | ICD-10-CM | POA: Diagnosis not present

## 2017-10-08 DIAGNOSIS — Z9861 Coronary angioplasty status: Secondary | ICD-10-CM | POA: Diagnosis not present

## 2017-10-08 DIAGNOSIS — E782 Mixed hyperlipidemia: Secondary | ICD-10-CM | POA: Diagnosis not present

## 2017-10-08 DIAGNOSIS — E669 Obesity, unspecified: Secondary | ICD-10-CM | POA: Diagnosis not present

## 2017-10-08 DIAGNOSIS — I2510 Atherosclerotic heart disease of native coronary artery without angina pectoris: Secondary | ICD-10-CM | POA: Diagnosis not present

## 2017-10-08 DIAGNOSIS — E1169 Type 2 diabetes mellitus with other specified complication: Secondary | ICD-10-CM | POA: Diagnosis not present

## 2017-10-08 NOTE — Patient Instructions (Addendum)
Your physician has recommended you make the following change in your medication: STOP Clopidogrel  Dr Sallyanne Kuster recommends that you schedule a follow-up appointment in 12 months. You will receive a reminder letter in the mail two months in advance. If you don't receive a letter, please call our office to schedule the follow-up appointment.  If you need a refill on your cardiac medications before your next appointment, please call your pharmacy.

## 2017-10-09 NOTE — Progress Notes (Signed)
Cardiology Office Note:    Date:  10/09/2017   ID:  Dylan [name], DOB 04-16-1966, MRN [id number]  PCP:  [name], PA  Cardiologist:  [name], MD   Referring MD: [name], [place]   Chief Complaint  Patient presents with  . Follow-up  Preoperative cardiovascular exam, CAD follow-up  History of Present Illness:    Dylan [name] is a 52 y.o. male with a hx of premature onset CAD returning for routine follow-up and evaluation prior to umbilical hernia repair with Dr. Ninfa Linden.  He had extensive stenting of the right coronary artery in 2011 and then required angioplasty for restenosis in 2013 and thrombosis in January 2016  (during treatment with androgen supplements he presented with mesenteric vein thrombosis and had a non-STEMI at the same time). In December 2016 and again in December 2017, his cardiac catheterization showed no evidence of restenosis disease progression.  He has preserved left ventricular systolic function.  Low risk no stress test October 2017 and normal nuclear stress test November 2018.  Additional medical problems include hypertension, hyperlipidemia, type 2 diabetes mellitus and a previous stroke without sequelae (following 2013 catheterization).  [name] is asymptomatic from a cardiac point of view and has not required any revascularization procedure since January 2016. He denies problems with angina at rest or with activity, dyspnea with activity, dizziness, syncope, palpitations, leg edema, claudication or focal neurological problems.  He is now taking empagliflozin for diabetes.  He continues to take daily diuretic and has had an excellent lipid profile response to Repatha, after being intolerant to numerous statins.  He is occasional troubled by palpitations and his Holter monitor showed that these were due to PACs, without any complex atrial or ventricular arrhythmia.  Atrial fibrillation has never been detected.  Past Medical History:   Diagnosis Date  . CAD S/P percutaneous coronary angioplasty 07/2010; 07/2012; 08/2014   1) 2/'11: MI - 100% RCA - PCI (3 Taxus ION DES 3.0 x 28, 3.0 x 24 & 3.5 x 12 distal-prox); b) ISR of RCA stent - -AnngioSculpt PTCA; c) NSTEMI 1/'16: mild ISR with Thrombosis of RCA Stents (in setting of Mesenteric Vein Thrombosis) - Cutting Balloon PTCA; c. Re-look Cath 07/2015 & 07/2016: ~15% ISR in RCA,20% p-mLAD & o-pCx.  . Coronary stent thrombosis 08/15/2014  . DM type 2 (diabetes mellitus, type 2) ([place]) 06/07/2017  . Essential hypertension 07/07/2012  . Finding of multiple premature atrial contractions by electrocardiography    Symptomatic palpitations ; worse with poor sleep and fatigue along with dehydration.   . Hyperlipidemia with target LDL less than 70 09/01/2014  . Mesenteric vein thrombosis   . Migraines    "maybe one/yr" (07/06/2012)  . NSTEMI (non-ST elevated myocardial infarction) ([place]) 07/2010, 08/2014   And unstable angina in February 20 13th; Echo 1/12/'16: Moderate concentric LVH. EF 60-65% with no regional WMA. Gr 1 DD, otherwise normal  . Stroke ([place]) 2011; 2013   S/P cardiac cath - embolic stroke; denies residual (07/06/2012)  . 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 04/19/2013   No evidence of CVA on MRI/MRA & Dliated Eye Examination. ruptured blood vessel & not occlusion.    Past Surgical History:  Procedure Laterality Date  . BOWEL RESECTION N/A 08/28/2014   Procedure: SMALL BOWEL RESECTION;  Surgeon: [name], MD;  Location: [place];  Service: General;  Laterality: N/A;  . CARDIAC  CATHETERIZATION  04/18/2013  . CARDIAC CATHETERIZATION N/A 07/20/2015   Procedure: Left Heart Cath and Coronary Angiography;  Surgeon: [name], MD;  Location: [place] [facility];  Service: Cardiovascular;  15% ISR in the RCA stent segment, 20% proximal-mid LAD as well as ostial and proximal  circumflex. Normal LV function.  Marland Kitchen CARDIAC CATHETERIZATION N/A 07/23/2016   Procedure: Left Heart Cath and Coronary Angiography;  Surgeon: [name], MD;  Location: [place] [facility];  Service: Cardiovascular: E 55-65%. ~15% diffuse RCA ISR, ~20% diffuse pLAD & pCx  . CORONARY ANGIOPLASTY  07/06/2012; 08/15/2014   a) 12/'13: PTCA of 80% ISR mRCA - AngioSculpt; b) PTCA of  ISR/thrombosis  . CORONARY ANGIOPLASTY WITH STENT PLACEMENT  07/2010   inferior wall MI - 3 Taxus Ion DES (3.0x91mm, 3.0x75mm, 3.5x17mm) to prox and mid RCA  . LAPAROSCOPIC APPENDECTOMY N/A 08/28/2014   Procedure: APPENDECTOMY LAPAROSCOPIC;  Surgeon: [name], MD;  Location: [place];  Service: General;  Laterality: N/A;  . LAPAROSCOPY  08/28/2014   Procedure: LAPAROSCOPY DIAGNOSTIC;  Surgeon: [name], MD;  Location: [place];  Service: General;;  . LEFT HEART CATHETERIZATION WITH CORONARY ANGIOGRAM N/A 06/11/2012   Procedure: LEFT HEART CATHETERIZATION WITH CORONARY ANGIOGRAM;  Surgeon: [name], MD;  Location: [place] [facility];  Service: Cardiovascular;  Laterality: N/A;  . LEFT HEART CATHETERIZATION WITH CORONARY ANGIOGRAM N/A 04/18/2013   Procedure: LEFT HEART CATHETERIZATION WITH CORONARY ANGIOGRAM;  Surgeon: [name], MD;  Location: [facility] C[facility] LAB;  Service: Cardiovascular;  Laterality: N/A;  . LEFT HEART CATHETERIZATION WITH CORONARY ANGIOGRAM N/A 08/15/2014   Procedure: LEFT HEART CATHETERIZATION WITH CORONARY ANGIOGRAM;  Surgeon: [name], MD;  Location: [facility] CATH LAB;  Service: Cardiovascular;  Laterality: N/A;  . PERCUTANEOUS CORONARY STENT INTERVENTION (PCI-S) N/A 07/06/2012   Procedure: PERCUTANEOUS CORONARY STENT INTERVENTION (PCI-S);  Surgeon: [name], MD;  Location: [facility] C[facility] LAB;  Service: Cardiovascular;  Laterality: N/A;  . SEPTOPLASTY  2001  . TRANSTHORACIC ECHOCARDIOGRAM  08/15/2014   Moderate concentric LVH. EF 60-65% with no regional WMA. Gr 1 DD, otherwise normal    Current  Medications: Current Meds  Medication Sig  . Empagliflozin-Metformin HCl ER (SYNJARDY XR) 25-1000 MG TB24 Take 1 tablet by mouth daily.  . Evolocumab (REPATHA SURECLICK) 409 MG/ML SOAJ Inject 140 mg into the skin every 14 (fourteen) days.  . furosemide (LASIX) 40 MG tablet TAKE 1 TABLET(40 MG) BY MOUTH TWICE DAILY  . ibuprofen (ADVIL,MOTRIN) 200 MG tablet Take 600 mg by mouth every 6 (six) hours as needed for headache or moderate pain.   . metoprolol tartrate (LOPRESSOR) 25 MG tablet Daily as NEEDED for Palpitations  . Nebivolol HCl (BYSTOLIC) 20 MG TABS Take 1 tablet (20 mg total) by mouth 2 (two) times daily.  . nitroGLYCERIN (NITROSTAT) 0.4 MG SL tablet Place 1 tablet (0.4 mg total) under the tongue every 5 (five) minutes x 3 doses as needed for chest pain.  Marland Kitchen olmesartan (BENICAR) 20 MG tablet Take 1 tablet (20 mg total) by mouth daily.  Vladimir Faster Glycol-Propyl Glycol (SYSTANE OP) Place 1 drop into both eyes daily as needed (dry eyes).  . [DISCONTINUED] clopidogrel (PLAVIX) 75 MG tablet TAKE 1 TABLET(75 MG) BY MOUTH DAILY WITH BREAKFAST  . [DISCONTINUED] Febuxostat (ULORIC) 80 MG TABS Take 80 mg by mouth daily.     Allergies:   Crestor [rosuvastatin]; Zofran [ondansetron hcl]; Ace inhibitors; Lipitor [atorvastatin]; and Codeine   Social History   Socioeconomic History  .  Marital status: Married    Spouse name: None  . Number of children: 2  . Years of education: 2  . Highest education level: None  Social Needs  . Financial resource strain: None  . Food insecurity - worry: None  . Food insecurity - inability: None  . Transportation needs - medical: None  . Transportation needs - non-medical: None  Occupational History  . Occupation: Pharmacologist: [employer]  Tobacco Use  . Smoking status: Former Smoker    Packs/day: 1.00    Years: 15.00    Pack years: 15.00    Types: Cigarettes    Last attempt to quit: 06/30/2010    Years since quitting: 7.2  . Smokeless  tobacco: Current User    Types: Snuff    Last attempt to quit: 06/30/2010  Substance and Sexual Activity  . Alcohol use: Yes    Alcohol/week: 4.2 oz    Types: 7 Shots of liquor per week    Comment: a  glass of scotch per day  . Drug use: No  . Sexual activity: Yes  Other Topics Concern  . None  Social History Narrative  . None     Family History: The patient's family history includes Atrial fibrillation in his mother; Coronary artery disease in his father; Heart attack in his father, paternal grandfather, and paternal uncle; Mitral valve prolapse in his mother.  ROS:   Please see the history of present illness.     All other systems reviewed and are negative.  EKGs/Labs/Other Studies Reviewed:    The following studies were reviewed today: Notes from Dr. Ellyn Hack, multiple cardiac catheterization reports, Holter monitor  EKG:  EKG is ordered today.  The ekg ordered today demonstrates normal sinus rhythm without any visible ischemic sequelae, QTC 409, normal repolarization  Recent Labs: 09/19/2017: BUN 17; Creatinine, Ser 1.18; Hemoglobin 16.4; Platelets 219; Potassium 3.9; Sodium 139  Recent Lipid Panel    Component Value Date/Time   CHOL 123 08/25/2017 0805   TRIG 363 (H) 08/25/2017 0805   HDL 34 (L) 08/25/2017 0805   CHOLHDL 3.6 08/25/2017 0805   CHOLHDL 2.6 06/08/2017 0315   VLDL 62 (H) 06/08/2017 0315   LDLCALC 16 08/25/2017 0805    Physical Exam:    VS:  BP 126/83   Pulse 65   Ht 6\' 2"  (1.88 m)   Wt 284 lb 12.8 oz (129.2 kg)   BMI 36.57 kg/m     Wt Readings from Last 3 Encounters:  10/08/17 284 lb 12.8 oz (129.2 kg)  08/12/17 290 lb (131.5 kg)  06/29/17 291 lb (132 kg)     GEN: Moderately obese, well nourished, well developed in no acute distress HEENT: Normal NECK: No JVD; No carotid bruits LYMPHATICS: No lymphadenopathy CARDIAC: RRR, no murmurs, rubs, gallops RESPIRATORY:  Clear to auscultation without rales, wheezing or rhonchi  ABDOMEN: Soft,  non-tender, non-distended MUSCULOSKELETAL:  No edema; No deformity  SKIN: Warm and dry NEUROLOGIC:  Alert and oriented x 3 PSYCHIATRIC:  Normal affect   ASSESSMENT:    1. CAD S/P PCI with 3 DES stents to RCA and PTCA x2 for ISR and thrombosis    2. Preoperative cardiovascular examination   3. Mixed hyperlipidemia   4. Diabetes mellitus type 2 in obese ([place])   5. Premature atrial contractions    PLAN:    In order of problems listed above:  1. CAD: Asymptomatic, no revascularization since 2016.  I think he can stop clopidogrel.  His thrombotic events were likely not due to conventional atherothrombotic events.  Initial restenosis in 2013 was likely related to the long segment of stented vessel.  Thrombosis of the right coronary artery in 2016 occurred in the setting of simultaneous mesenteric vein thrombosis, consistent with a hypercoagulable environment during treatment with androgens.  Consider low-dose rivaroxaban 2.5 mg twice daily after he has his abdominal surgery. 2. Preop: I think he is at low risk for umbilical hernia repair.  It is important that his beta-blocker (nebivolol) is not interrupted in the perioperative period. 3. HLP: Outstanding lipid profile on PCS canine inhibitor, except for residual elevation in triglycerides.  This will improve with weight loss, improve diet, continued good treatment of diabetes.  Most recent hemoglobin A1c was 6.4% in August 2018. 4. DM:  Weight loss, regular exercise recommended. 5. PACs: No documentation of atrial fibrillation to date.   Medication Adjustments/Labs and Tests Ordered: Current medicines are reviewed at length with the patient today.  Concerns regarding medicines are outlined above.  Orders Placed This Encounter  Procedures  . EKG 12-Lead   No orders of the defined types were placed in this encounter.   Signed, [name], MD  10/09/2017 2:11 PM    [facility]

## 2017-10-11 ENCOUNTER — Other Ambulatory Visit: Payer: Self-pay | Admitting: Student

## 2017-10-14 ENCOUNTER — Other Ambulatory Visit: Payer: Self-pay | Admitting: Surgery

## 2017-10-14 ENCOUNTER — Encounter (HOSPITAL_COMMUNITY): Payer: Self-pay | Admitting: *Deleted

## 2017-10-14 ENCOUNTER — Other Ambulatory Visit: Payer: Self-pay

## 2017-10-14 NOTE — Progress Notes (Addendum)
   EKG-10/08/2017-epic CXR-06/06/17-epic  Holter monitor- 08/26/2017-epic  ECHO-06/18/17-epic  07/2016-heart cath 06/03/16-Stress Test 10/08/2017- LOV- Dr Loleta Chance( cardiology) - epic- clearance in note CBC and BMP-2/161/19-epic

## 2017-10-14 NOTE — H&P (Signed)
Dylan [name] Documented: 10/06/2017 10:52 AM Location: [facility] Patient #: [id number] DOB: 15-Dec-1965 Married / Language: English / Race: White Male   History of Present Illness Dylan [name] A. Ninfa [name]; 10/06/2017 11:14 AM) The patient is a 52 year old male who presents with an incisional hernia. This gentleman is well-known to me. I operated on him and January 2016 for a small bowel perforation. He had earlier had portal venous and SMV thrombosis that ended up being a cause of the eventual perforation. He also had prior recent myocardial infarction. Since then, he has done very well in his return to work. About a month ago, he started noticing a hernia at the umbilicus. He reports no discomfort and he's been able to reduce the hernia. He still is seeing closely by his cardiologist and is only on Plavix now. She actually has an appointment this Thursday.  Past Medical History:  Diagnosis Date  . CAD S/P percutaneous coronary angioplasty 07/2010; 07/2012; 08/2014   1) 2/'11: MI - 100% RCA - PCI (3 Taxus ION DES 3.0 x 28, 3.0 x 24 & 3.5 x 12 distal-prox); b) ISR of RCA stent - -AnngioSculpt PTCA; c) NSTEMI 1/'16: mild ISR with Thrombosis of RCA Stents (in setting of Mesenteric Vein Thrombosis) - Cutting Balloon PTCA; c. Re-look Cath 07/2015 & 07/2016: ~15% ISR in RCA,20% p-mLAD & o-pCx.  . Coronary stent thrombosis 08/15/2014  . DM type 2 (diabetes mellitus, type 2) ([place]) 06/07/2017  . Essential hypertension 07/07/2012  . Finding of multiple premature atrial contractions by electrocardiography    Symptomatic palpitations ; worse with poor sleep and fatigue along with dehydration.   . Hyperlipidemia with target LDL less than 70 09/01/2014  . Mesenteric vein thrombosis   . Migraines    "maybe one/yr" (07/06/2012)  . NSTEMI (non-ST elevated myocardial infarction) ([place]) 07/2010, 08/2014   And unstable angina in February 20 13th; Echo 1/12/'16: Moderate concentric LVH. EF  60-65% with no regional WMA. Gr 1 DD, otherwise normal  . Stroke ([place]) 2011; 2013   S/P cardiac cath - embolic stroke; denies residual (07/06/2012)  . 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 04/19/2013   No evidence of CVA on MRI/MRA & Dliated Eye Examination. ruptured blood vessel & not occlusion.   Past Surgical History:  Procedure Laterality Date  . BOWEL RESECTION N/A 08/28/2014   Procedure: SMALL BOWEL RESECTION;  Surgeon: [name], MD;  Location: [place];  Service: General;  Laterality: N/A;  . CARDIAC CATHETERIZATION  04/18/2013  . CARDIAC CATHETERIZATION N/A 07/20/2015   Procedure: Left Heart Cath and Coronary Angiography;  Surgeon: [name], MD;  Location: [place] [facility];  Service: Cardiovascular;  15% ISR in the RCA stent segment, 20% proximal-mid LAD as well as ostial and proximal circumflex. Normal LV function.  Marland Kitchen CARDIAC CATHETERIZATION N/A 07/23/2016   Procedure: Left Heart Cath and Coronary Angiography;  Surgeon: [name], MD;  Location: [place] [facility];  Service: Cardiovascular: E 55-65%. ~15% diffuse RCA ISR, ~20% diffuse pLAD & pCx  . CORONARY ANGIOPLASTY  07/06/2012; 08/15/2014   a) 12/'13: PTCA of 80% ISR mRCA - AngioSculpt; b) PTCA of  ISR/thrombosis  . CORONARY ANGIOPLASTY WITH STENT PLACEMENT  07/2010   inferior wall MI - 3 Taxus Ion DES (3.0x62mm, 3.0x59mm, 3.5x60mm) to prox and mid RCA  . LAPAROSCOPIC APPENDECTOMY N/A 08/28/2014   Procedure: APPENDECTOMY  LAPAROSCOPIC;  Surgeon: [name], MD;  Location: [place];  Service: General;  Laterality: N/A;  . LAPAROSCOPY  08/28/2014   Procedure: LAPAROSCOPY DIAGNOSTIC;  Surgeon: [name], MD;  Location: [place];  Service: General;;  . LEFT HEART CATHETERIZATION WITH CORONARY ANGIOGRAM N/A 06/11/2012   Procedure: LEFT HEART CATHETERIZATION WITH CORONARY ANGIOGRAM;  Surgeon: [name], MD;  Location: [place] CATH LAB;  Service: Cardiovascular;  Laterality: N/A;  . LEFT HEART CATHETERIZATION WITH CORONARY ANGIOGRAM N/A 04/18/2013   Procedure: LEFT HEART CATHETERIZATION WITH CORONARY ANGIOGRAM;  Surgeon: [name], MD;  Location: [facility] C[facility] LAB;  Service: Cardiovascular;  Laterality: N/A;  . LEFT HEART CATHETERIZATION WITH CORONARY ANGIOGRAM N/A 08/15/2014   Procedure: LEFT HEART CATHETERIZATION WITH CORONARY ANGIOGRAM;  Surgeon: [name], MD;  Location: [facility] CATH LAB;  Service: Cardiovascular;  Laterality: N/A;  . PERCUTANEOUS CORONARY STENT INTERVENTION (PCI-S) N/A 07/06/2012   Procedure: PERCUTANEOUS CORONARY STENT INTERVENTION (PCI-S);  Surgeon: [name], MD;  Location: [facility] CATH LAB;  Service: Cardiovascular;  Laterality: N/A;  . SEPTOPLASTY  2001  . TRANSTHORACIC ECHOCARDIOGRAM  08/15/2014   Moderate concentric LVH. EF 60-65% with no regional WMA. Gr 1 DD, otherwise normal       Allergies ([name], CMA; 10/06/2017 10:52 AM) ACE Inhibitors  Cough. Lipitor *ANTIHYPERLIPIDEMICS*  Zofran *ANTIEMETICS*  Codeine Phosphate *ANALGESICS - OPIOID*   Medication History ([name], CMA; 6/0/1093 23:55 AM) Bystolic (20MG  Tablet, Oral) Active. Furosemide (40MG  Tablet, Oral) Active. Metoprolol Tartrate (25MG  Tablet, Oral) Active. Olmesartan Medoxomil (20MG  Tablet, Oral) Active. Synjardy XR (25-1000MG  Tablet ER 24HR, Oral) Active. Uloric (80MG  Tablet, Oral) Active. MiraLax (Oral) Active. Repatha (140MG /ML Soln Pref Syr, Subcutaneous) Active. Medications Reconciled  Vitals ([name] CMA; 10/06/2017 10:52 AM) 10/06/2017 10:52 AM Weight: 284 lb Height: 74in Body Surface Area: 2.52 m Body Mass Index: 36.46 kg/m  BP: 118/82 (Sitting, Left Arm, Standard)    Physical Exam (Airyana Sprunger A. Ninfa [name]; 10/06/2017 11:14 AM)  The physical exam findings are as follows: Note:He looks well examination.  His lungs are clear.  Cardiac  shows regular rate and rhythm.  There is a easily reducible incisional hernia at his umbilicus. The fascial defect is only about 1-2 cm in size. It is nontender Skin shows no rash    Assessment & Plan (Gaelyn Tukes A. Ninfa [name]; 10/06/2017 11:15 AM) Fatima Blank HERNIA (K43.2) Impression: We discussed the hernia diagnosis in detail. He was to go ahead and proceed with the incisional hernia repair with mesh. I believe that he do this as an outpatient with a small incision open with mesh. We discussed the procedure in detail. I discussed the risk which includes but is not limited to bleeding, infection, injury to surrounding structures, recurrent hernia, cardiopulmonary issues, postoperative recovery, etc. We will get clearance from cardiology prior to proceeding with surgery

## 2017-10-15 ENCOUNTER — Ambulatory Visit (HOSPITAL_COMMUNITY)
Admission: RE | Admit: 2017-10-15 | Discharge: 2017-10-15 | Disposition: A | Discharge status: Home / Self Care | Payer: BLUE CROSS/BLUE SHIELD | Source: Ambulatory Visit | Attending: Surgery | Admitting: Surgery

## 2017-10-15 ENCOUNTER — Encounter (HOSPITAL_COMMUNITY)
Admission: RE | Disposition: A | Discharge status: Home / Self Care | Payer: Self-pay | Source: Ambulatory Visit | Attending: Surgery

## 2017-10-15 ENCOUNTER — Encounter (HOSPITAL_COMMUNITY): Payer: Self-pay | Admitting: Emergency Medicine

## 2017-10-15 ENCOUNTER — Ambulatory Visit (HOSPITAL_COMMUNITY): Payer: BLUE CROSS/BLUE SHIELD | Admitting: Certified Registered Nurse Anesthetist

## 2017-10-15 ENCOUNTER — Other Ambulatory Visit: Payer: Self-pay

## 2017-10-15 DIAGNOSIS — Z79899 Other long term (current) drug therapy: Secondary | ICD-10-CM | POA: Diagnosis not present

## 2017-10-15 DIAGNOSIS — Z6836 Body mass index (BMI) 36.0-36.9, adult: Secondary | ICD-10-CM | POA: Diagnosis not present

## 2017-10-15 DIAGNOSIS — I340 Nonrheumatic mitral (valve) insufficiency: Secondary | ICD-10-CM | POA: Insufficient documentation

## 2017-10-15 DIAGNOSIS — I252 Old myocardial infarction: Secondary | ICD-10-CM | POA: Diagnosis not present

## 2017-10-15 DIAGNOSIS — E1151 Type 2 diabetes mellitus with diabetic peripheral angiopathy without gangrene: Secondary | ICD-10-CM | POA: Insufficient documentation

## 2017-10-15 DIAGNOSIS — Z955 Presence of coronary angioplasty implant and graft: Secondary | ICD-10-CM | POA: Diagnosis not present

## 2017-10-15 DIAGNOSIS — K430 Incisional hernia with obstruction, without gangrene: Secondary | ICD-10-CM | POA: Diagnosis not present

## 2017-10-15 DIAGNOSIS — K432 Incisional hernia without obstruction or gangrene: Secondary | ICD-10-CM | POA: Diagnosis not present

## 2017-10-15 DIAGNOSIS — I2510 Atherosclerotic heart disease of native coronary artery without angina pectoris: Secondary | ICD-10-CM | POA: Diagnosis not present

## 2017-10-15 DIAGNOSIS — Z7984 Long term (current) use of oral hypoglycemic drugs: Secondary | ICD-10-CM | POA: Diagnosis not present

## 2017-10-15 DIAGNOSIS — Z8673 Personal history of transient ischemic attack (TIA), and cerebral infarction without residual deficits: Secondary | ICD-10-CM | POA: Insufficient documentation

## 2017-10-15 DIAGNOSIS — E785 Hyperlipidemia, unspecified: Secondary | ICD-10-CM | POA: Diagnosis not present

## 2017-10-15 DIAGNOSIS — Z87891 Personal history of nicotine dependence: Secondary | ICD-10-CM | POA: Diagnosis not present

## 2017-10-15 DIAGNOSIS — I10 Essential (primary) hypertension: Secondary | ICD-10-CM | POA: Insufficient documentation

## 2017-10-15 DIAGNOSIS — E7849 Other hyperlipidemia: Secondary | ICD-10-CM | POA: Diagnosis not present

## 2017-10-15 HISTORY — PX: INCISIONAL HERNIA REPAIR: SHX193

## 2017-10-15 HISTORY — PX: INSERTION OF MESH: SHX5868

## 2017-10-15 LAB — GLUCOSE, CAPILLARY
Glucose-Capillary: 123 mg/dL — ABNORMAL HIGH (ref 65–99)
Glucose-Capillary: 124 mg/dL — ABNORMAL HIGH (ref 65–99)

## 2017-10-15 SURGERY — REPAIR, HERNIA, INCISIONAL
Anesthesia: General | Site: Abdomen

## 2017-10-15 MED ORDER — OXYCODONE HCL 5 MG PO TABS: 5.0000 mg | ORAL_TABLET | Freq: Once | ORAL | Status: DC | PRN

## 2017-10-15 MED ORDER — CHLORHEXIDINE GLUCONATE CLOTH 2 % EX PADS: 6.0000 | MEDICATED_PAD | Freq: Once | CUTANEOUS | Status: DC

## 2017-10-15 MED ORDER — DEXAMETHASONE SODIUM PHOSPHATE 10 MG/ML IJ SOLN
INTRAMUSCULAR | Status: DC | PRN
  Administered 2017-10-15: 10 mg via INTRAVENOUS

## 2017-10-15 MED ORDER — FENTANYL CITRATE (PF) 100 MCG/2ML IJ SOLN
INTRAMUSCULAR | Status: DC | PRN
  Administered 2017-10-15 (×2): 50 ug via INTRAVENOUS

## 2017-10-15 MED ORDER — CEFAZOLIN SODIUM-DEXTROSE 1-4 GM/50ML-% IV SOLN
1.0000 g | Freq: Once | INTRAVENOUS | Status: AC
  Administered 2017-10-15: 1 g via INTRAVENOUS
  Filled 2017-10-15: qty 50

## 2017-10-15 MED ORDER — SUCCINYLCHOLINE CHLORIDE 200 MG/10ML IV SOSY
PREFILLED_SYRINGE | INTRAVENOUS | Status: AC
  Filled 2017-10-15: qty 10

## 2017-10-15 MED ORDER — KETOROLAC TROMETHAMINE 30 MG/ML IJ SOLN: 30.0000 mg | Freq: Once | INTRAMUSCULAR | Status: DC | PRN

## 2017-10-15 MED ORDER — ONDANSETRON HCL 4 MG/2ML IJ SOLN: 4.0000 mg | Freq: Once | INTRAMUSCULAR | Status: DC | PRN

## 2017-10-15 MED ORDER — CEFAZOLIN SODIUM-DEXTROSE 2-4 GM/100ML-% IV SOLN
2.0000 g | INTRAVENOUS | Status: AC
  Administered 2017-10-15: 2 g via INTRAVENOUS
  Filled 2017-10-15: qty 100

## 2017-10-15 MED ORDER — OXYCODONE HCL 5 MG PO TABS: 5.0000 mg | ORAL_TABLET | ORAL | Status: DC | PRN

## 2017-10-15 MED ORDER — ACETAMINOPHEN 160 MG/5ML PO SOLN: 325.0000 mg | ORAL | Status: DC | PRN

## 2017-10-15 MED ORDER — SODIUM CHLORIDE 0.9% FLUSH: 3.0000 mL | INTRAVENOUS | Status: DC | PRN

## 2017-10-15 MED ORDER — DEXAMETHASONE SODIUM PHOSPHATE 10 MG/ML IJ SOLN
INTRAMUSCULAR | Status: AC
  Filled 2017-10-15: qty 1

## 2017-10-15 MED ORDER — OXYCODONE HCL 5 MG PO TABS
ORAL_TABLET | ORAL | Status: AC
  Filled 2017-10-15: qty 1

## 2017-10-15 MED ORDER — OXYCODONE HCL 5 MG/5ML PO SOLN
5.0000 mg | Freq: Once | ORAL | Status: AC | PRN
  Administered 2017-10-15: 5 mg via ORAL
  Filled 2017-10-15: qty 5

## 2017-10-15 MED ORDER — ACETAMINOPHEN 325 MG PO TABS: 325.0000 mg | ORAL_TABLET | ORAL | Status: DC | PRN

## 2017-10-15 MED ORDER — BUPIVACAINE HCL (PF) 0.5 % IJ SOLN
INTRAMUSCULAR | Status: DC | PRN
  Administered 2017-10-15: 20 mL

## 2017-10-15 MED ORDER — LIDOCAINE 2% (20 MG/ML) 5 ML SYRINGE
INTRAMUSCULAR | Status: AC
  Filled 2017-10-15: qty 5

## 2017-10-15 MED ORDER — MEPERIDINE HCL 50 MG/ML IJ SOLN: 6.2500 mg | INTRAMUSCULAR | Status: DC | PRN

## 2017-10-15 MED ORDER — LACTATED RINGERS IV SOLN
INTRAVENOUS | Status: DC
  Administered 2017-10-15: 10:00:00 via INTRAVENOUS

## 2017-10-15 MED ORDER — FENTANYL CITRATE (PF) 100 MCG/2ML IJ SOLN
INTRAMUSCULAR | Status: AC
  Filled 2017-10-15: qty 2

## 2017-10-15 MED ORDER — HYDROMORPHONE HCL 2 MG PO TABS: 2.0000 mg | ORAL_TABLET | Freq: Two times a day (BID) | ORAL | 0 refills | Status: DC | PRN

## 2017-10-15 MED ORDER — LIDOCAINE 2% (20 MG/ML) 5 ML SYRINGE
INTRAMUSCULAR | Status: DC | PRN
  Administered 2017-10-15: 100 mg via INTRAVENOUS

## 2017-10-15 MED ORDER — BUPIVACAINE HCL (PF) 0.5 % IJ SOLN
INTRAMUSCULAR | Status: AC
  Filled 2017-10-15: qty 30

## 2017-10-15 MED ORDER — PROPOFOL 10 MG/ML IV BOLUS
INTRAVENOUS | Status: AC
  Filled 2017-10-15: qty 20

## 2017-10-15 MED ORDER — FENTANYL CITRATE (PF) 100 MCG/2ML IJ SOLN: 25.0000 ug | INTRAMUSCULAR | Status: DC | PRN

## 2017-10-15 MED ORDER — MIDAZOLAM HCL 2 MG/2ML IJ SOLN
INTRAMUSCULAR | Status: AC
  Filled 2017-10-15: qty 2

## 2017-10-15 MED ORDER — SODIUM CHLORIDE 0.9 % IV SOLN: 250.0000 mL | INTRAVENOUS | Status: DC | PRN

## 2017-10-15 MED ORDER — ETOMIDATE 2 MG/ML IV SOLN
INTRAVENOUS | Status: AC
  Filled 2017-10-15: qty 10

## 2017-10-15 MED ORDER — 0.9 % SODIUM CHLORIDE (POUR BTL) OPTIME
TOPICAL | Status: DC | PRN
  Administered 2017-10-15: 1000 mL

## 2017-10-15 MED ORDER — ROCURONIUM BROMIDE 10 MG/ML (PF) SYRINGE
PREFILLED_SYRINGE | INTRAVENOUS | Status: AC
  Filled 2017-10-15: qty 5

## 2017-10-15 MED ORDER — MIDAZOLAM HCL 5 MG/5ML IJ SOLN
INTRAMUSCULAR | Status: DC | PRN
  Administered 2017-10-15: 2 mg via INTRAVENOUS

## 2017-10-15 MED ORDER — ACETAMINOPHEN 650 MG RE SUPP
650.0000 mg | RECTAL | Status: DC | PRN
  Filled 2017-10-15: qty 1

## 2017-10-15 MED ORDER — OXYCODONE HCL 5 MG PO TABS: 5.0000 mg | ORAL_TABLET | Freq: Once | ORAL | Status: AC | PRN

## 2017-10-15 MED ORDER — SODIUM CHLORIDE 0.9% FLUSH: 3.0000 mL | Freq: Two times a day (BID) | INTRAVENOUS | Status: DC

## 2017-10-15 MED ORDER — FENTANYL CITRATE (PF) 100 MCG/2ML IJ SOLN
25.0000 ug | INTRAMUSCULAR | Status: DC | PRN
  Administered 2017-10-15 (×2): 50 ug via INTRAVENOUS

## 2017-10-15 MED ORDER — MORPHINE SULFATE (PF) 4 MG/ML IV SOLN: 1.0000 mg | INTRAVENOUS | Status: DC | PRN

## 2017-10-15 MED ORDER — PROPOFOL 10 MG/ML IV BOLUS
INTRAVENOUS | Status: DC | PRN
  Administered 2017-10-15: 250 mg via INTRAVENOUS

## 2017-10-15 MED ORDER — ACETAMINOPHEN 325 MG PO TABS: 650.0000 mg | ORAL_TABLET | ORAL | Status: DC | PRN

## 2017-10-15 MED ORDER — OXYCODONE HCL 5 MG/5ML PO SOLN
5.0000 mg | Freq: Once | ORAL | Status: DC | PRN
  Filled 2017-10-15: qty 5

## 2017-10-15 SURGICAL SUPPLY — 23 items
CHLORAPREP W/TINT 26ML (MISCELLANEOUS) ×2 IMPLANT
DECANTER SPIKE VIAL GLASS SM (MISCELLANEOUS) IMPLANT
DERMABOND ADVANCED (GAUZE/BANDAGES/DRESSINGS) ×1
DERMABOND ADVANCED .7 DNX12 (GAUZE/BANDAGES/DRESSINGS) ×1 IMPLANT
DRAPE LAPAROSCOPIC ABDOMINAL (DRAPES) ×2 IMPLANT
GLOVE BIOGEL PI IND STRL 7.0 (GLOVE) ×1 IMPLANT
GLOVE BIOGEL PI INDICATOR 7.0 (GLOVE) ×1
GLOVE SURG SIGNA 7.5 PF LTX (GLOVE) ×2 IMPLANT
GOWN STRL REUS W/TWL LRG LVL3 (GOWN DISPOSABLE) ×2 IMPLANT
GOWN STRL REUS W/TWL XL LVL3 (GOWN DISPOSABLE) ×4 IMPLANT
KIT BASIN OR (CUSTOM PROCEDURE TRAY) ×2 IMPLANT
MESH VENTRALEX ST 1-7/10 CRC S (Mesh General) ×2 IMPLANT
PACK GENERAL/GYN (CUSTOM PROCEDURE TRAY) ×2 IMPLANT
STAPLER VISISTAT 35W (STAPLE) ×2 IMPLANT
SUT MNCRL AB 4-0 PS2 18 (SUTURE) ×2 IMPLANT
SUT NOVA NAB DX-16 0-1 5-0 T12 (SUTURE) ×6 IMPLANT
SUT VIC AB 2-0 CT1 27 (SUTURE) ×1
SUT VIC AB 2-0 CT1 TAPERPNT 27 (SUTURE) ×1 IMPLANT
SUT VIC AB 3-0 SH 27 (SUTURE) ×1
SUT VIC AB 3-0 SH 27X BRD (SUTURE) ×1 IMPLANT
SYR CONTROL 10ML LL (SYRINGE) ×2 IMPLANT
TOWEL OR 17X26 10 PK STRL BLUE (TOWEL DISPOSABLE) ×2 IMPLANT
TRAY FOLEY W/METER SILVER 16FR (SET/KITS/TRAYS/PACK) IMPLANT

## 2017-10-15 NOTE — Op Note (Signed)
HERNIA REPAIR INCISIONAL, INSERTION OF MESH  Procedure Note  Dylan [name] 10/15/2017   Pre-op Diagnosis: Incisional hernia at the umbilicus     Post-op Diagnosis: same  Procedure(s): HERNIA REPAIR INCISIONAL INSERTION OF MESH (4.3 cm round ventral patch)  Surgeon(s): [name], MD  Anesthesia: General  Staff:  Circulator: Alena Bills, RN Relief Circulator: Noralyn Pick, RN Relief Scrub: Ignacia Palma, RN Scrub Person: Illa Level  Estimated Blood Loss: Minimal                Findings: The patient was found to have a small 1 cm fascial defect at the umbilicus at a previous incision.  It was repaired with a 4.3 cm round ventral Prolene patch from Bard  Procedure: The patient was brought to the operating room and identified as the correct patient.  He was placed supine on the operating table and general anesthesia was induced.  His abdomen was then prepped and draped in the usual sterile fashion.  I anesthetized the skin of the previous scar below the umbilicus with Marcaine.  I then made an incision with a scalpel.  I took this down to the hernia sac.  I then removed the sac from the overlying umbilical skin with the cautery.  The sac contained only omentum.  I excised the sac and reduce the contents back into the abdominal cavity.  The fascial defect was a centimeter in size.  I placed a 4.3 cm round ventral patch from Bard through the fascial opening and then pulled it up against the peritoneum with the stay ties.  I then sutured the mesh in place with interrupted #1 Novafil sutures.  I cut the ties and closed the fascia over the top of the mesh with a figure-of-eight #1 Novafil suture.  I then anesthetized the fascia with Marcaine.  Hemostasis was achieved with the cautery.  I tacked the umbilical skin back in place with a 3-0 Vicryl suture.  I then closed the subcutaneous tissue with 3-0 Vicryl sutures and closed the skin with a running 4-0 Monocryl.   Dermabond was then applied.  The patient tolerated the procedure well.  All the counts were correct at the end of the procedure.  The patient was then extubated in the operating room and taken in stable condition to the recovery room.        [name] A   Date: 10/15/2017  Time: 11:41 AM

## 2017-10-15 NOTE — Interval H&P Note (Signed)
History and Physical Interval Note:no change in H and P  10/15/2017 10:54 AM  Dylan [name]  has presented today for surgery, with the diagnosis of Incisional hernia at the umbilicus  The various methods of treatment have been discussed with the patient and family. After consideration of risks, benefits and other options for treatment, the patient has consented to  Procedure(s): HERNIA REPAIR INCISIONAL (N/A) INSERTION OF MESH (N/A) as a surgical intervention .  The patient's history has been reviewed, patient examined, no change in status, stable for surgery.  I have reviewed the patient's chart and labs.  Questions were answered to the patient's satisfaction.     Gitel Beste A

## 2017-10-15 NOTE — Anesthesia Preprocedure Evaluation (Addendum)
Anesthesia Evaluation  Patient identified by MRN, date of birth, ID band Patient awake    Reviewed: Allergy & Precautions, NPO status , Patient's Chart, lab work & pertinent test results  Airway Mallampati: II  TM Distance: >3 FB Neck ROM: Full    Dental no notable dental hx.    Pulmonary former smoker,    Pulmonary exam normal breath sounds clear to auscultation       Cardiovascular hypertension, Pt. on medications and Pt. on home beta blockers + angina + CAD, + Past MI, + Cardiac Stents and + Peripheral Vascular Disease  Normal cardiovascular exam Rhythm:Regular Rate:Normal  NSTEMI with 08/15/14 PTCA of RCA stent thrombosis.  ECHO 11/18 Study Conclusions  Left ventricle: The cavity size was normal. Wall thickness was   increased in a pattern of moderate LVH. Systolic function was   normal. The estimated ejection fraction was in the range of 60%   to 65%. Wall motion was normal; there were no regional wall   motion abnormalities. There was no evidence of elevated   ventricular filling pressure by Doppler parameters. - Aorta: Ascending aortic diameter: 42 mm (S). - Ascending aorta: The ascending aorta was mildly dilated. - Mitral valve: There was trivial regurgitation. - Left atrium: The atrium was mildly dilated.    Neuro/Psych  Headaches, CVA    GI/Hepatic negative GI ROS, Neg liver ROS,   Endo/Other  diabetesMorbid obesity  Renal/GU ARFRenal disease     Musculoskeletal negative musculoskeletal ROS (+)   Abdominal   Peds  Hematology  (+) anemia , Hypercoagulable Anemic   Anesthesia Other Findings   Reproductive/Obstetrics                            Anesthesia Physical  Anesthesia Plan  ASA: III  Anesthesia Plan: General   Post-op Pain Management:    Induction: Intravenous  PONV Risk Score and Plan: 1 and Treatment may vary due to age or medical condition and  Dexamethasone  Airway Management Planned: LMA  Additional Equipment:   Intra-op Plan:   Post-operative Plan: Extubation in OR  Informed Consent: I have reviewed the patients History and Physical, chart, labs and discussed the procedure including the risks, benefits and alternatives for the proposed anesthesia with the patient or authorized representative who has indicated his/her understanding and acceptance.   Dental advisory given  Plan Discussed with: CRNA and Surgeon  Anesthesia Plan Comments:      Anesthesia Quick Evaluation

## 2017-10-15 NOTE — Anesthesia Procedure Notes (Signed)
Procedure Name: LMA Insertion Date/Time: 10/15/2017 11:05 AM Performed by: Lollie Sails, CRNA Pre-anesthesia Checklist: Patient identified, Emergency Drugs available, Suction available, Patient being monitored and Timeout performed Patient Re-evaluated:Patient Re-evaluated prior to induction Oxygen Delivery Method: Circle system utilized Preoxygenation: Pre-oxygenation with 100% oxygen Induction Type: IV induction Ventilation: Mask ventilation without difficulty LMA: LMA inserted LMA Size: 5.0 Number of attempts: 1 Placement Confirmation: positive ETCO2 and breath sounds checked- equal and bilateral Tube secured with: Tape Dental Injury: Teeth and Oropharynx as per pre-operative assessment

## 2017-10-15 NOTE — Discharge Instructions (Signed)
[facility] _______[facility]  UMBILICAL OR INGUINAL HERNIA REPAIR: POST OP INSTRUCTIONS  Always review your discharge instruction sheet given to you by the facility where your surgery was performed. IF YOU HAVE DISABILITY OR FAMILY LEAVE FORMS, YOU MUST BRING THEM TO THE OFFICE FOR PROCESSING.   DO NOT GIVE THEM TO YOUR DOCTOR.  1. A  prescription for pain medication may be given to you upon discharge.  Take your pain medication as prescribed, if needed.  If narcotic pain medicine is not needed, then you may take acetaminophen (Tylenol) or ibuprofen (Advil) as needed. 2. Take your usually prescribed medications unless otherwise directed. If you need a refill on your pain medication, please contact your pharmacy.  They will contact our office to request authorization. Prescriptions will not be filled after 5 pm or on week-ends. 3. You should follow a light diet the first 24 hours after arrival home, such as soup and crackers, etc.  Be sure to include lots of fluids daily.  Resume your normal diet the day after surgery. 4.Most patients will experience some swelling and bruising around the umbilicus or in the groin and scrotum.  Ice packs and reclining will help.  Swelling and bruising can take several days to resolve.  6. It is common to experience some constipation if taking pain medication after surgery.  Increasing fluid intake and taking a stool softener (such as Colace) will usually help or prevent this problem from occurring.  A mild laxative (Milk of Magnesia or Miralax) should be taken according to package directions if there are no bowel movements after 48 hours. 7. Unless discharge instructions indicate otherwise, you may remove your bandages 24-48 hours after surgery, and you may shower at that time.  You may have steri-strips (small skin tapes) in place directly over the incision.  These strips should be left on the skin for 7-10 days.  If your surgeon used skin glue on the  incision, you may shower in 24 hours.  The glue will flake off over the next 2-3 weeks.  Any sutures or staples will be removed at the office during your follow-up visit. 8. ACTIVITIES:  You may resume regular (light) daily activities beginning the next day--such as daily self-care, walking, climbing stairs--gradually increasing activities as tolerated.  You may have sexual intercourse when it is comfortable.  Refrain from any heavy lifting or straining until approved by your doctor.  a.You may drive when you are no longer taking prescription pain medication, you can comfortably wear a seatbelt, and you can safely maneuver your car and apply brakes. b.RETURN TO WORK:   _____________________________________________  9.You should see your doctor in the office for a follow-up appointment approximately 2-3 weeks after your surgery.  Make sure that you call for this appointment within a day or two after you arrive home to insure a convenient appointment time. 10.OTHER INSTRUCTIONS: _OK TO SHOWER STARTING TOMORROW ICE PACK, TYLENOL, AND IBUPROFEN ALSO FOR PAIN NO LIFTING MORE THAN 15 POUNDS FOR 4 WEEKS________________________    _____________________________________  WHEN TO CALL YOUR DOCTOR: 1. Fever over 101.0 2. Inability to urinate 3. Nausea and/or vomiting 4. Extreme swelling or bruising 5. Continued bleeding from incision. 6. Increased pain, redness, or drainage from the incision  The clinic staff is available to answer your questions during regular business hours.  Please don't hesitate to call and ask to speak to one of the nurses for clinical concerns.  If you have a medical emergency, go to the nearest   emergency room or call 911.  A surgeon from [facility] is always on call at the hospital   [address], [address], [place], [place]  [address] ?  [address] [address], [place],    [address] [phone] ? [phone] ? FAX [phone] Web site:  [email or web address] 

## 2017-10-15 NOTE — Transfer of Care (Signed)
Immediate Anesthesia Transfer of Care Note  Patient: Dylan [name]  Procedure(s) Performed: HERNIA REPAIR INCISIONAL (N/A Abdomen) INSERTION OF MESH (N/A Abdomen)  Patient Location: PACU  Anesthesia Type:General  Level of Consciousness: sedated  Airway & Oxygen Therapy: Patient Spontanous Breathing and Patient connected to face mask oxygen  Post-op Assessment: Report given to RN and Post -op Vital signs reviewed and stable  Post vital signs: Reviewed and stable  Last Vitals:  Vitals:   10/15/17 0951  BP: (!) 141/83  Pulse: 63  Resp: 18  Temp: 36.8 C  SpO2: 100%    Last Pain:  Vitals:   10/15/17 0951  TempSrc: Oral      Patients Stated Pain Goal: 4 (34/96/11 6435)  Complications: No apparent anesthesia complications

## 2017-10-15 NOTE — Anesthesia Postprocedure Evaluation (Signed)
Anesthesia Post Note  Patient: Dylan [name]  Procedure(s) Performed: HERNIA REPAIR INCISIONAL (N/A Abdomen) INSERTION OF MESH (N/A Abdomen)     Patient location during evaluation: PACU Anesthesia Type: General Level of consciousness: awake and alert Pain management: pain level controlled Vital Signs Assessment: post-procedure vital signs reviewed and stable Respiratory status: spontaneous breathing, nonlabored ventilation, respiratory function stable and patient connected to nasal cannula oxygen Cardiovascular status: blood pressure returned to baseline and stable Postop Assessment: no apparent nausea or vomiting Anesthetic complications: no    Last Vitals:  Vitals:   10/15/17 1215 10/15/17 1230  BP: 134/86 136/88  Pulse: 60 (!) 56  Resp: 18 (!) 7  Temp: 36.8 C 36.8 C  SpO2: 98% 93%    Last Pain:  Vitals:   10/15/17 1300  TempSrc:   PainSc: 3                  Traylon Schimming

## 2017-10-29 ENCOUNTER — Encounter: Payer: Self-pay | Admitting: Cardiovascular Disease

## 2017-11-06 ENCOUNTER — Other Ambulatory Visit: Payer: Self-pay | Admitting: Cardiology

## 2017-11-09 ENCOUNTER — Other Ambulatory Visit: Payer: Self-pay | Admitting: Physician Assistant

## 2017-11-09 NOTE — Telephone Encounter (Signed)
Please review for refill. Thanks!  

## 2017-11-10 NOTE — Telephone Encounter (Signed)
REFILL 

## 2017-12-18 DIAGNOSIS — L03114 Cellulitis of left upper limb: Secondary | ICD-10-CM | POA: Diagnosis not present

## 2018-01-05 DIAGNOSIS — I119 Hypertensive heart disease without heart failure: Secondary | ICD-10-CM | POA: Diagnosis not present

## 2018-01-05 DIAGNOSIS — I2510 Atherosclerotic heart disease of native coronary artery without angina pectoris: Secondary | ICD-10-CM | POA: Diagnosis not present

## 2018-01-05 DIAGNOSIS — R7303 Prediabetes: Secondary | ICD-10-CM | POA: Diagnosis not present

## 2018-01-05 DIAGNOSIS — E785 Hyperlipidemia, unspecified: Secondary | ICD-10-CM | POA: Diagnosis not present

## 2018-01-06 DIAGNOSIS — I119 Hypertensive heart disease without heart failure: Secondary | ICD-10-CM | POA: Diagnosis not present

## 2018-01-06 DIAGNOSIS — E785 Hyperlipidemia, unspecified: Secondary | ICD-10-CM | POA: Diagnosis not present

## 2018-01-06 DIAGNOSIS — R7303 Prediabetes: Secondary | ICD-10-CM | POA: Diagnosis not present

## 2018-03-07 ENCOUNTER — Other Ambulatory Visit: Payer: Self-pay | Admitting: Cardiovascular Disease

## 2018-03-13 ENCOUNTER — Other Ambulatory Visit: Payer: Self-pay | Admitting: Cardiology

## 2018-04-02 ENCOUNTER — Encounter (HOSPITAL_COMMUNITY): Payer: Self-pay | Admitting: Emergency Medicine

## 2018-04-02 ENCOUNTER — Emergency Department (HOSPITAL_COMMUNITY): Payer: BLUE CROSS/BLUE SHIELD

## 2018-04-02 ENCOUNTER — Other Ambulatory Visit: Payer: Self-pay

## 2018-04-02 ENCOUNTER — Emergency Department (HOSPITAL_COMMUNITY)
Admission: EM | Admit: 2018-04-02 | Discharge: 2018-04-02 | Disposition: A | Discharge status: Home / Self Care | Payer: BLUE CROSS/BLUE SHIELD | Attending: Emergency Medicine | Admitting: Emergency Medicine

## 2018-04-02 DIAGNOSIS — Z5321 Procedure and treatment not carried out due to patient leaving prior to being seen by health care provider: Secondary | ICD-10-CM | POA: Insufficient documentation

## 2018-04-02 DIAGNOSIS — R079 Chest pain, unspecified: Secondary | ICD-10-CM | POA: Diagnosis not present

## 2018-04-02 LAB — CBC
HCT: 49 % (ref 39.0–52.0)
Hemoglobin: 16.1 g/dL (ref 13.0–17.0)
MCH: 30.1 pg (ref 26.0–34.0)
MCHC: 32.9 g/dL (ref 30.0–36.0)
MCV: 91.6 fL (ref 78.0–100.0)
PLATELETS: 224 10*3/uL (ref 150–400)
RBC: 5.35 MIL/uL (ref 4.22–5.81)
RDW: 13.3 % (ref 11.5–15.5)
WBC: 7 10*3/uL (ref 4.0–10.5)

## 2018-04-02 LAB — I-STAT TROPONIN, ED: TROPONIN I, POC: 0.01 ng/mL (ref 0.00–0.08)

## 2018-04-02 LAB — BASIC METABOLIC PANEL
Anion gap: 9 (ref 5–15)
BUN: 15 mg/dL (ref 6–20)
CO2: 30 mmol/L (ref 22–32)
CREATININE: 1.32 mg/dL — AB (ref 0.61–1.24)
Calcium: 9.4 mg/dL (ref 8.9–10.3)
Chloride: 102 mmol/L (ref 98–111)
GFR calc non Af Amer: >60 mL/min (ref >60)
Glucose, Bld: 118 mg/dL — ABNORMAL HIGH (ref 70–99)
Potassium: 4.3 mmol/L (ref 3.5–5.1)
SODIUM: 141 mmol/L (ref 135–145)

## 2018-04-02 NOTE — ED Notes (Signed)
Per EMT pt has left the emergency room.

## 2018-04-02 NOTE — ED Notes (Signed)
Pt encouraged to stay and wait to be seen by a doctor pt refused triage RN aware

## 2018-04-02 NOTE — ED Triage Notes (Signed)
Pt with lefts sided chest pain with radiation to the back x 3 days. He also reports dizziness and almost passing out. Hx MI x2 with stents.  A/O in triage

## 2018-05-25 DIAGNOSIS — M25562 Pain in left knee: Secondary | ICD-10-CM | POA: Diagnosis not present

## 2018-06-09 ENCOUNTER — Other Ambulatory Visit: Payer: Self-pay | Admitting: Cardiovascular Disease

## 2018-06-11 NOTE — Telephone Encounter (Signed)
Rx request sent to pharmacy.  

## 2018-06-25 ENCOUNTER — Other Ambulatory Visit: Payer: Self-pay | Admitting: Cardiology

## 2018-06-25 NOTE — Telephone Encounter (Signed)
New Message:    Reference#[id number]    She is calling to check on the status of the pt's Prior Authorization for his Repatha.

## 2018-06-25 NOTE — Telephone Encounter (Signed)
PA expires on Jan/2020

## 2018-07-19 ENCOUNTER — Encounter: Payer: Self-pay | Admitting: Cardiology

## 2018-07-19 ENCOUNTER — Telehealth: Payer: Self-pay

## 2018-07-19 ENCOUNTER — Ambulatory Visit (INDEPENDENT_AMBULATORY_CARE_PROVIDER_SITE_OTHER): Payer: BLUE CROSS/BLUE SHIELD | Admitting: Cardiology

## 2018-07-19 VITALS — BP 124/78 | HR 69 | Ht 74.0 in | Wt 286.5 lb

## 2018-07-19 DIAGNOSIS — Z9861 Coronary angioplasty status: Secondary | ICD-10-CM

## 2018-07-19 DIAGNOSIS — Z8673 Personal history of transient ischemic attack (TIA), and cerebral infarction without residual deficits: Secondary | ICD-10-CM | POA: Diagnosis not present

## 2018-07-19 DIAGNOSIS — I10 Essential (primary) hypertension: Secondary | ICD-10-CM

## 2018-07-19 DIAGNOSIS — E785 Hyperlipidemia, unspecified: Secondary | ICD-10-CM | POA: Diagnosis not present

## 2018-07-19 DIAGNOSIS — I2510 Atherosclerotic heart disease of native coronary artery without angina pectoris: Secondary | ICD-10-CM | POA: Diagnosis not present

## 2018-07-19 MED ORDER — NITROGLYCERIN 0.4 MG SL SUBL: 0.4000 mg | SUBLINGUAL_TABLET | SUBLINGUAL | 2 refills | Status: DC | PRN

## 2018-07-19 MED ORDER — ASPIRIN EC 81 MG PO TBEC: 81.0000 mg | DELAYED_RELEASE_TABLET | Freq: Every day | ORAL | 3 refills | Status: DC

## 2018-07-19 NOTE — Telephone Encounter (Signed)
Spoke with patient and he stated he would rather take a baby Aspirin once a day instead of Plavix. Informed patient that Aspirin can be purchased over the counter. He voiced understanding.

## 2018-07-19 NOTE — Assessment & Plan Note (Signed)
LDL 19 Aug 2017 on PCSK9

## 2018-07-19 NOTE — Assessment & Plan Note (Signed)
Controlled.  

## 2018-07-19 NOTE — Assessment & Plan Note (Signed)
H/O of CVA, post cardiac cath, minimal speech deficit  Nov 2011 

## 2018-07-19 NOTE — Telephone Encounter (Signed)
-----   Message from Erlene Quan, [place] sent at 07/19/2018 12:27 PM EST ----- Please tell the patient I discussed aspirin therapy therapy with Dr. Sallyanne Kuster.  Dr. Sallyanne Kuster wants the patient to either take an aspirin 81 mg a day or Plavix 75 mg a day.  [name] PA-C 07/19/2018 12:28 PM

## 2018-07-19 NOTE — Assessment & Plan Note (Signed)
CAD, RCA X 3 ION DES 07/01/2010, ISR -06/23/12 and Jan 2016- Cath dec 2016 and Dec 2017 showed patent stents Myoview Nov 2018 was low risk

## 2018-07-19 NOTE — Progress Notes (Signed)
07/19/2018 [name] Dylan   [birth year]  [id number]  Primary Physician [name], PA Primary Cardiologist: Dr Sallyanne Kuster  HPI: [name] is in the office today for his annual follow-up.  He has a history of a non-ST elevation MI treated with RCA drug-eluting stent in 2011.  This was complicated by a small post PCI CVA.  He had in-stent restenosis in November 2013.  In January 2016 he had in-stent restenosis.  Relook catheterization in December 2016 and December 2017 showed no in-stent restenosis.  Myoview study in November 2018 was low risk.  Patient in the office today for his routine follow-up.  He has several concerns.  He has had palpitations that have been documented to be PACs.  He is concerned that he is having these and he feels like no one has given him a good explanation for this.  I reassured him again that he does not have ischemia on his stress test and that his heart function is normal.  He is not having any sustained tachycardia.  Suggested he avoid any kind of stimulants including caffeine.  He says he only drinks a couple coffee a day.  He says he is also been in the emergency room for chest pain and no one is been able to tell him what that is from.  He was released from the emergency room.  He does not have reproducible exertional chest pain.  He also was concerned because Dr. Ellyn Hack told him he would be on Plavix forever but that Dr. Sallyanne Kuster told him to stop this.  He is not taking an aspirin.  He asked me today during the visit "what would happen if I just stopped all my medications."    Current Outpatient Medications  Medication Sig Dispense Refill  . Empagliflozin-Metformin HCl ER (SYNJARDY XR) 25-1000 MG TB24 Take 1 tablet by mouth daily.    . Evolocumab (REPATHA SURECLICK) 720 MG/ML SOAJ Inject 140 mg into the skin every 14 (fourteen) days. 6 pen 3  . furosemide (LASIX) 40 MG tablet TAKE 1 TABLET(40 MG) BY MOUTH TWICE DAILY 180 tablet 0  . ibuprofen  (ADVIL,MOTRIN) 200 MG tablet Take 600 mg by mouth every 6 (six) hours as needed for headache or moderate pain.     . metoprolol tartrate (LOPRESSOR) 25 MG tablet TAKE DAILY AS NEEDED FOR PALPITATIONS 45 tablet 3  . Nebivolol HCl (BYSTOLIC) 20 MG TABS Take 1 tablet (20 mg total) by mouth 2 (two) times daily. (Patient taking differently: Take 20 mg by mouth at bedtime. ) 180 tablet 3  . Nebivolol HCl 20 MG TABS Take 20 mg by mouth 2 (two) times daily. Patient is taking 1 20mg  tablet po qd    . nitroGLYCERIN (NITROSTAT) 0.4 MG SL tablet Place 1 tablet (0.4 mg total) under the tongue every 5 (five) minutes x 3 doses as needed for chest pain. 25 tablet 2  . olmesartan (BENICAR) 20 MG tablet TAKE 1 TABLET(20 MG) BY MOUTH DAILY 30 tablet 11  . Polyethyl Glycol-Propyl Glycol (SYSTANE OP) Place 1 drop into both eyes daily as needed (dry eyes).    . Potassium 99 MG TABS Take 1 tablet by mouth daily.     No current facility-administered medications for this visit.     Allergies  Allergen Reactions  . Crestor [Rosuvastatin] Other (See Comments)    Severe muscle pain and nausea  . Zofran [Ondansetron Hcl] Nausea And Vomiting  . Ace Inhibitors Cough  . Lipitor [Atorvastatin] Other (See  Comments)    myalgia  . Codeine Itching    Past Medical History:  Diagnosis Date  . CAD S/P percutaneous coronary angioplasty 07/2010; 07/2012; 08/2014   1) 2/'11: MI - 100% RCA - PCI (3 Taxus ION DES 3.0 x 28, 3.0 x 24 & 3.5 x 12 distal-prox); b) ISR of RCA stent - -AnngioSculpt PTCA; c) NSTEMI 1/'16: mild ISR with Thrombosis of RCA Stents (in setting of Mesenteric Vein Thrombosis) - Cutting Balloon PTCA; c. Re-look Cath 07/2015 & 07/2016: ~15% ISR in RCA,20% p-mLAD & o-pCx.  . Coronary stent thrombosis 08/15/2014  . DM type 2 (diabetes mellitus, type 2) ([place]) 06/07/2017  . Essential hypertension 07/07/2012  . Finding of multiple premature atrial contractions by electrocardiography    Symptomatic palpitations ; worse  with poor sleep and fatigue along with dehydration.   . Hyperlipidemia with target LDL less than 70 09/01/2014  . Mesenteric vein thrombosis   . Migraines    "maybe one/yr" (07/06/2012)  . NSTEMI (non-ST elevated myocardial infarction) ([place]) 07/2010, 08/2014   And unstable angina in February 20 13th; Echo 1/12/'16: Moderate concentric LVH. EF 60-65% with no regional WMA. Gr 1 DD, otherwise normal  . Stroke ([place]) 2011; 2013   S/P cardiac cath - embolic stroke; denies residual (07/06/2012)  . 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 04/19/2013   No evidence of CVA on MRI/MRA & Dliated Eye Examination. ruptured blood vessel & not occlusion.    Social History   Socioeconomic History  . Marital status: Married    Spouse name: Not on file  . Number of children: 2  . Years of education: 23  . Highest education level: Not on file  Occupational History  . Occupation: Pharmacologist: [employer]  Social Needs  . Financial resource strain: Not on file  . Food insecurity:    Worry: Not on file    Inability: Not on file  . Transportation needs:    Medical: Not on file    Non-medical: Not on file  Tobacco Use  . Smoking status: Former Smoker    Packs/day: 1.00    Years: 15.00    Pack years: 15.00    Types: Cigarettes    Last attempt to quit: 06/30/2010    Years since quitting: 8.0  . Smokeless tobacco: Current User    Types: Snuff  Substance and Sexual Activity  . Alcohol use: Yes    Alcohol/week: 7.0 standard drinks    Types: 7 Shots of liquor per week    Comment: a  glass of scotch per day  . Drug use: No  . Sexual activity: Yes  Lifestyle  . Physical activity:    Days per week: Not on file    Minutes per session: Not on file  . Stress: Not on file  Relationships  . Social connections:    Talks on phone: Not on file    Gets together: Not on file    Attends religious  service: Not on file    Active member of club or organization: Not on file    Attends meetings of clubs or organizations: Not on file    Relationship status: Not on file  . Intimate partner violence:    Fear of current or ex partner: Not on file    Emotionally abused: Not on file    Physically abused: Not on file  Forced sexual activity: Not on file  Other Topics Concern  . Not on file  Social History Narrative  . Not on file     Family History  Problem Relation Age of Onset  . Atrial fibrillation Mother   . Mitral valve prolapse Mother   . Coronary artery disease Father        CABG, aortic aneursym,  . Heart attack Father   . Heart attack Paternal Uncle   . Heart attack Paternal Grandfather      Review of Systems: General: negative for chills, fever, night sweats or weight changes.  Cardiovascular: negative for chest pain, dyspnea on exertion, edema, orthopnea, palpitations, paroxysmal nocturnal dyspnea or shortness of breath Dermatological: negative for rash Respiratory: negative for cough or wheezing Urologic: negative for hematuria Abdominal: negative for nausea, vomiting, diarrhea, bright red blood per rectum, melena, or hematemesis Neurologic: negative for visual changes, syncope, or dizziness All other systems reviewed and are otherwise negative except as noted above.    Blood pressure 124/78, pulse 69, height 6\' 2"  (1.88 m), weight 286 lb 8 oz (130 kg), SpO2 96 %.  General appearance: alert, cooperative, no distress and mildly obese Neck: no carotid bruit and no JVD Lungs: clear to auscultation bilaterally Heart: regular rate and rhythm Extremities: no edema Neurologic: Grossly normal  EKG NSR, no acute changes  ASSESSMENT AND PLAN:   CAD S/P percutaneous coronary angioplasty CAD, RCA X 3 ION DES 07/01/2010, ISR -06/23/12 and Jan 2016- Cath dec 2016 and Dec 2017 showed patent stents Myoview Nov 2018 was low risk   Dyslipidemia, statin intol LDL 19 Aug 2017 on PCSK9  Essential hypertension Controlled  H/O: CVA (cerebrovascular accident) H/O of CVA, post cardiac cath, minimal speech deficit  Nov 2011   PLAN  Reassurance. F/U in one year.  I'll ask dr Sallyanne Kuster about ASA therapy- I think there must have been a misunderstanding.   [name] PA-C 07/19/2018 11:52 AM

## 2018-07-19 NOTE — Progress Notes (Signed)
He is to take either aspirin 81 mg or Plavix 75 mg once daily.  He does not need both.  I would recommend aspirin as the first choice.

## 2018-07-19 NOTE — Patient Instructions (Signed)
Medication Instructions:  Your physician recommends that you continue on your current medications as directed. Please refer to the Current Medication list given to you today. If you need a refill on your cardiac medications before your next appointment, please call your pharmacy.   Lab work: None  If you have labs (blood work) drawn today and your tests are completely normal, you will receive your results only by: Marland Kitchen MyChart Message (if you have MyChart) OR . A paper copy in the mail If you have any lab test that is abnormal or we need to change your treatment, we will call you to review the results.  Testing/Procedures: None   Follow-Up: At [facility], you and your health needs are our priority.  As part of our continuing mission to provide you with exceptional heart care, we have created designated Provider Care Teams.  These Care Teams include your primary Cardiologist (physician) and Advanced Practice Providers (APPs -  Physician Assistants and Nurse Practitioners) who all work together to provide you with the care you need, when you need it. You will need a follow up appointment in 12 months.  Please call our office 2 months (OCTOBER 2020) in advance to schedule this appointment.  You may see [name], MD or one of the following Advanced Practice Providers on your designated Care Team: [place], [place] . Fabian Sharp, PA-C  Any Other Special Instructions Will Be Listed Below (If Applicable).

## 2018-07-21 DIAGNOSIS — H04123 Dry eye syndrome of bilateral lacrimal glands: Secondary | ICD-10-CM | POA: Diagnosis not present

## 2018-07-21 DIAGNOSIS — E119 Type 2 diabetes mellitus without complications: Secondary | ICD-10-CM | POA: Diagnosis not present

## 2018-07-21 DIAGNOSIS — H01021 Squamous blepharitis right upper eyelid: Secondary | ICD-10-CM | POA: Diagnosis not present

## 2018-07-21 DIAGNOSIS — H35033 Hypertensive retinopathy, bilateral: Secondary | ICD-10-CM | POA: Diagnosis not present

## 2018-07-29 ENCOUNTER — Telehealth: Payer: Self-pay

## 2018-07-29 NOTE — Telephone Encounter (Signed)
Called to talk to pt regarding insurance issues with repatha trying to do a pa.

## 2018-08-06 ENCOUNTER — Telehealth: Payer: Self-pay | Admitting: Cardiology

## 2018-08-06 NOTE — Telephone Encounter (Signed)
New Message:     She would like for you to call, concerning pt's Repatha please.

## 2018-08-11 DIAGNOSIS — E1169 Type 2 diabetes mellitus with other specified complication: Secondary | ICD-10-CM | POA: Diagnosis not present

## 2018-08-11 DIAGNOSIS — E785 Hyperlipidemia, unspecified: Secondary | ICD-10-CM | POA: Diagnosis not present

## 2018-08-11 DIAGNOSIS — E1159 Type 2 diabetes mellitus with other circulatory complications: Secondary | ICD-10-CM | POA: Diagnosis not present

## 2018-08-11 DIAGNOSIS — Z7984 Long term (current) use of oral hypoglycemic drugs: Secondary | ICD-10-CM | POA: Diagnosis not present

## 2018-08-11 DIAGNOSIS — Z87891 Personal history of nicotine dependence: Secondary | ICD-10-CM | POA: Diagnosis not present

## 2018-08-11 DIAGNOSIS — I10 Essential (primary) hypertension: Secondary | ICD-10-CM | POA: Diagnosis not present

## 2018-08-11 DIAGNOSIS — Z1329 Encounter for screening for other suspected endocrine disorder: Secondary | ICD-10-CM | POA: Diagnosis not present

## 2018-08-25 DIAGNOSIS — I739 Peripheral vascular disease, unspecified: Secondary | ICD-10-CM | POA: Diagnosis not present

## 2018-08-29 ENCOUNTER — Other Ambulatory Visit: Payer: Self-pay

## 2018-08-31 ENCOUNTER — Telehealth: Payer: Self-pay | Admitting: Cardiovascular Disease

## 2018-08-31 ENCOUNTER — Other Ambulatory Visit: Payer: Self-pay

## 2018-08-31 MED ORDER — FUROSEMIDE 40 MG PO TABS: 40.0000 mg | ORAL_TABLET | Freq: Every day | ORAL | 3 refills | Status: DC

## 2018-08-31 MED ORDER — NEBIVOLOL HCL 20 MG PO TABS: 20.0000 mg | ORAL_TABLET | Freq: Two times a day (BID) | ORAL | 3 refills | Status: DC

## 2018-08-31 NOTE — Telephone Encounter (Signed)
Received a message from pharmacy calling to verify pt's bystolic instruction.  Per chart reviewed, it was increase for 20 mg QD to 20 mg BID on 08/12/17 by Billey Chang, [place]. Pt was last seen by [name], PA on 07/19/18 which states pt is taking medication differently other than BID. Called pt to confirm how he is taking his medication. He reports he only takes it once daily. Will route message to PA for clarifications.

## 2018-08-31 NOTE — Telephone Encounter (Signed)
New Message:      Please call, need to verify directions on pt's Bystolic.

## 2018-09-01 NOTE — Telephone Encounter (Signed)
Notified operator that message was sent to PA on 1/28 to clarify dose/frequency

## 2018-09-02 MED ORDER — NEBIVOLOL HCL 20 MG PO TABS: 20.0000 mg | ORAL_TABLET | Freq: Every day | ORAL | 3 refills | Status: DC

## 2018-09-02 NOTE — Telephone Encounter (Signed)
Dylan [name] from [facility] updated and new Rx sent for Bystolic 20 mg daily.

## 2018-09-02 NOTE — Telephone Encounter (Signed)
Yes-Bystolic is once a day  Jaysha Lasure PA-C 09/02/2018 9:09 AM

## 2018-09-09 ENCOUNTER — Telehealth: Payer: Self-pay | Admitting: Pharmacist Clinician (PhC)/ Clinical Pharmacy Specialist

## 2018-09-09 MED ORDER — EVOLOCUMAB 140 MG/ML ~~LOC~~ SOAJ: 140.0000 mg | SUBCUTANEOUS | 3 refills | Status: DC

## 2018-09-28 NOTE — Telephone Encounter (Signed)
They called to check on status of prior-auth. They said they have this script since January, they hold it for much longer without filling it.   [place] [facility] [place].

## 2018-09-28 NOTE — Telephone Encounter (Signed)
**Note De-Identified Arpan Eskelson Obfuscation** The pt is seen in our [employer]. Will forward this message to NL Triage to address.

## 2018-09-29 NOTE — Telephone Encounter (Signed)
Spoke with pharmacy - they're aware PA approved thru end of year and they need to contact [facility]

## 2018-10-04 ENCOUNTER — Other Ambulatory Visit: Payer: Self-pay | Admitting: Cardiovascular Disease

## 2018-10-05 DIAGNOSIS — R05 Cough: Secondary | ICD-10-CM | POA: Diagnosis not present

## 2018-10-06 ENCOUNTER — Other Ambulatory Visit: Payer: Self-pay | Admitting: Cardiovascular Disease

## 2018-10-18 ENCOUNTER — Telehealth: Payer: Self-pay | Admitting: Cardiovascular Disease

## 2018-10-18 ENCOUNTER — Other Ambulatory Visit: Payer: Self-pay

## 2018-10-18 ENCOUNTER — Encounter (HOSPITAL_COMMUNITY): Payer: Self-pay | Admitting: Emergency Medicine

## 2018-10-18 ENCOUNTER — Emergency Department (HOSPITAL_COMMUNITY)
Admission: EM | Admit: 2018-10-18 | Discharge: 2018-10-18 | Disposition: A | Discharge status: Home / Self Care | Payer: BLUE CROSS/BLUE SHIELD | Attending: Emergency Medicine | Admitting: Emergency Medicine

## 2018-10-18 ENCOUNTER — Emergency Department (HOSPITAL_COMMUNITY): Payer: BLUE CROSS/BLUE SHIELD

## 2018-10-18 DIAGNOSIS — Z79899 Other long term (current) drug therapy: Secondary | ICD-10-CM | POA: Insufficient documentation

## 2018-10-18 DIAGNOSIS — Z87891 Personal history of nicotine dependence: Secondary | ICD-10-CM | POA: Diagnosis not present

## 2018-10-18 DIAGNOSIS — R600 Localized edema: Secondary | ICD-10-CM | POA: Insufficient documentation

## 2018-10-18 DIAGNOSIS — Z7984 Long term (current) use of oral hypoglycemic drugs: Secondary | ICD-10-CM | POA: Insufficient documentation

## 2018-10-18 DIAGNOSIS — R079 Chest pain, unspecified: Secondary | ICD-10-CM | POA: Diagnosis not present

## 2018-10-18 DIAGNOSIS — I2510 Atherosclerotic heart disease of native coronary artery without angina pectoris: Secondary | ICD-10-CM | POA: Diagnosis not present

## 2018-10-18 DIAGNOSIS — I110 Hypertensive heart disease with heart failure: Secondary | ICD-10-CM | POA: Diagnosis not present

## 2018-10-18 DIAGNOSIS — I10 Essential (primary) hypertension: Secondary | ICD-10-CM | POA: Diagnosis not present

## 2018-10-18 DIAGNOSIS — Z7982 Long term (current) use of aspirin: Secondary | ICD-10-CM | POA: Insufficient documentation

## 2018-10-18 DIAGNOSIS — I5030 Unspecified diastolic (congestive) heart failure: Secondary | ICD-10-CM | POA: Diagnosis not present

## 2018-10-18 DIAGNOSIS — Z955 Presence of coronary angioplasty implant and graft: Secondary | ICD-10-CM | POA: Insufficient documentation

## 2018-10-18 DIAGNOSIS — R0789 Other chest pain: Secondary | ICD-10-CM | POA: Diagnosis present

## 2018-10-18 DIAGNOSIS — E119 Type 2 diabetes mellitus without complications: Secondary | ICD-10-CM | POA: Insufficient documentation

## 2018-10-18 LAB — CBC
HEMATOCRIT: 49.9 % (ref 39.0–52.0)
Hemoglobin: 16.6 g/dL (ref 13.0–17.0)
MCH: 29.9 pg (ref 26.0–34.0)
MCHC: 33.3 g/dL (ref 30.0–36.0)
MCV: 89.7 fL (ref 80.0–100.0)
NRBC: 0 % (ref 0.0–0.2)
Platelets: 224 10*3/uL (ref 150–400)
RBC: 5.56 MIL/uL (ref 4.22–5.81)
RDW: 12.4 % (ref 11.5–15.5)
WBC: 7.3 10*3/uL (ref 4.0–10.5)

## 2018-10-18 LAB — BASIC METABOLIC PANEL
Anion gap: 11 (ref 5–15)
BUN: 19 mg/dL (ref 6–20)
CHLORIDE: 99 mmol/L (ref 98–111)
CO2: 27 mmol/L (ref 22–32)
Calcium: 9.2 mg/dL (ref 8.9–10.3)
Creatinine, Ser: 1.33 mg/dL — ABNORMAL HIGH (ref 0.61–1.24)
GFR calc non Af Amer: >60 mL/min (ref >60)
GLUCOSE: 127 mg/dL — AB (ref 70–99)
Potassium: 3.8 mmol/L (ref 3.5–5.1)
Sodium: 137 mmol/L (ref 135–145)

## 2018-10-18 LAB — I-STAT TROPONIN, ED: Troponin i, poc: 0.01 ng/mL (ref 0.00–0.08)

## 2018-10-18 MED ORDER — OLMESARTAN MEDOXOMIL 40 MG PO TABS: 40.0000 mg | ORAL_TABLET | Freq: Every day | ORAL | 5 refills | Status: DC

## 2018-10-18 MED ORDER — SODIUM CHLORIDE 0.9% FLUSH: 3.0000 mL | Freq: Once | INTRAVENOUS | Status: DC

## 2018-10-18 NOTE — Telephone Encounter (Signed)
Spoke with patient and he is taking his Bystolic once a day. Patient did take his Metoprolol and blood pressure still 154/104 and he is dizzy. Advised patient if he is symptomatic and blood pressure not coming down to go to ED, patient verbalized understanding.

## 2018-10-18 NOTE — ED Triage Notes (Signed)
Pt with left side chest pressure when his bp is elevated. Hx of MI x2 with stents. Taking ASA, has not used any nitro. Pt denies shortness of breath but reports pain shoots through to the back. A/o x4 NAD.

## 2018-10-18 NOTE — Discharge Instructions (Addendum)
Please take your blood pressure medications exactly as prescribed by your doctor and your heart doctor.  Please take your blood pressure once a day, make sure it is the same time every day, seek medical exam for increasing chest pain blurred vision weakness numbness difficulty with balance or speech.  Have your family doctor recheck your blood pressure in 2 weeks in the office  ER for severe or worsening symptoms.

## 2018-10-18 NOTE — Telephone Encounter (Signed)
° ° ° ° °  1. What are your last 5 BP readings? 175/109  2. Are you having any other symptoms (ex. Dizziness, headache, blurred vision, passed out)?  Dizziness   3. What is your BP issue? BP too high for 1 week

## 2018-10-18 NOTE — ED Provider Notes (Signed)
[facility] EMERGENCY DEPARTMENT Provider Note   CSN: [id number] Arrival date & time: 10/18/18  1140    History   Chief Complaint Chief Complaint  Patient presents with  . Chest Pain    HPI Dylan [name] is a 53 y.o. male.     HPI  Presents to the hospital today with a complaint of high blood pressure.  He has had a history of high blood pressure as well as a history of coronary disease status post stenting, history of myocardial infarction and is currently controlled on his blood pressure with a combination of metoprolol and an angiotensin receptor blocker as well as a baby aspirin.  At no point is he had any chest pain shortness of breath or exertional symptoms however over the last week he has noticed that in the evening hours after he eats he develops a strange feeling including in his head and his arms, he takes his blood pressure and it is elevated.  He feels like he can tell when his blood pressure goes up and has had multiple measurements that are approximately 160/90 or 170/100 as it was today when he was at work.  He called his cardiologist who increased his dose of evening angiotensin receptor blocker, and told him to come to the emergency department for evaluation.  The patient is asymptomatic at this time.  Past Medical History:  Diagnosis Date  . CAD S/P percutaneous coronary angioplasty 07/2010; 07/2012; 08/2014   1) 2/'11: MI - 100% RCA - PCI (3 Taxus ION DES 3.0 x 28, 3.0 x 24 & 3.5 x 12 distal-prox); b) ISR of RCA stent - -AnngioSculpt PTCA; c) NSTEMI 1/'16: mild ISR with Thrombosis of RCA Stents (in setting of Mesenteric Vein Thrombosis) - Cutting Balloon PTCA; c. Re-look Cath 07/2015 & 07/2016: ~15% ISR in RCA,20% p-mLAD & o-pCx.  . Coronary stent thrombosis 08/15/2014  . DM type 2 (diabetes mellitus, type 2) ([place]) 06/07/2017  . Essential hypertension 07/07/2012  . Finding of multiple premature atrial contractions by electrocardiography    Symptomatic palpitations ; worse with poor sleep and fatigue along with dehydration.   . Hyperlipidemia with target LDL less than 70 09/01/2014  . Mesenteric vein thrombosis   . Migraines    "maybe one/yr" (07/06/2012)  . NSTEMI (non-ST elevated myocardial infarction) ([place]) 07/2010, 08/2014   And unstable angina in February 20 13th; Echo 1/12/'16: Moderate concentric LVH. EF 60-65% with no regional WMA. Gr 1 DD, otherwise normal  . Stroke ([place]) 2011; 2013   S/P cardiac cath - embolic stroke; denies residual (07/06/2012)  . 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 04/19/2013   No evidence of CVA on MRI/MRA & Dliated Eye Examination. ruptured blood vessel & not occlusion.    Patient Active Problem List   Diagnosis Date Noted  . DM type 2 (diabetes mellitus, type 2) ([place]) 06/07/2017  . Coronary artery disease involving native coronary artery of native heart   . Chest pain on exertion 06/06/2017  . Medication management 09/04/2016  . Diastolic dysfunction 94/85/4627  . Finding of multiple premature atrial contractions by electrocardiography 10/05/2015  . Iron deficiency anemia 09/29/2014  . Obesity (BMI 30-39.9) 09/09/2014  . Perforation bowel - 2/2 Mesenteric Ischemia from Mesenteric Vein Thrombosis.   . Intestinal perforation ([place]) 08/26/2014  . Coronary stent thrombosis 08/15/2014  . Acute embolism and thrombosis of vein   .  Mesenteric vein thrombosis   . Superior mesenteric vein thrombosis ([place]) 08/05/2014  . Portal vein thrombosis 08/05/2014  . 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 04/19/2013  . Possible Retinal artery branch occlusion of right eye = POST CATHETERIZATION -- by dilated Eye exam - no evidence of embolic occlusoin; possible microperforation. NOT CVA 04/19/2013    Class: Acute   . Hypertriglyceridemia - TG >500 04/19/2013  . Dyslipidemia, statin intol 07/07/2012  . Essential hypertension 07/07/2012  . Smoker, quit 06/30/10 07/07/2012  . CAD S/P percutaneous coronary angioplasty 07/06/2012  . H/O: CVA (cerebrovascular accident) 07/06/2012  . NSTEMI (non-ST elevated myocardial infarction) - 07/2010, 2013, 08/2014 07/04/2010    Past Surgical History:  Procedure Laterality Date  . BOWEL RESECTION N/A 08/28/2014   Procedure: SMALL BOWEL RESECTION;  Surgeon: [name], MD;  Location: [facility];  Service: General;  Laterality: N/A;  . CARDIAC CATHETERIZATION  04/18/2013  . CARDIAC CATHETERIZATION N/A 07/20/2015   Procedure: Left Heart Cath and Coronary Angiography;  Surgeon: [name], MD;  Location: [facility] CV LAB;  Service: Cardiovascular;  15% ISR in the RCA stent segment, 20% proximal-mid LAD as well as ostial and proximal circumflex. Normal LV function.  Marland Kitchen CARDIAC CATHETERIZATION N/A 07/23/2016   Procedure: Left Heart Cath and Coronary Angiography;  Surgeon: [name], MD;  Location: [facility] CV [facility];  Service: Cardiovascular: E 55-65%. ~15% diffuse RCA ISR, ~20% diffuse pLAD & pCx  . CORONARY ANGIOPLASTY  07/06/2012; 08/15/2014   a) 12/'13: PTCA of 80% ISR mRCA - AngioSculpt; b) PTCA of  ISR/thrombosis  . CORONARY ANGIOPLASTY WITH STENT PLACEMENT  07/2010   inferior wall MI - 3 Taxus Ion DES (3.0x29mm, 3.0x45mm, 3.5x15mm) to prox and mid RCA  . INCISIONAL HERNIA REPAIR N/A 10/15/2017   Procedure: HERNIA REPAIR INCISIONAL;  Surgeon: [name], MD;  Location: [facility];  Service: General;  Laterality: N/A;  . INSERTION OF MESH N/A 10/15/2017   Procedure: INSERTION OF MESH;  Surgeon: [name], MD;  Location: [facility];  Service: General;  Laterality: N/A;  . LAPAROSCOPIC APPENDECTOMY N/A 08/28/2014   Procedure: APPENDECTOMY LAPAROSCOPIC;  Surgeon: [name], MD;  Location: [facility];  Service: General;  Laterality: N/A;  . LAPAROSCOPY   08/28/2014   Procedure: LAPAROSCOPY DIAGNOSTIC;  Surgeon: [name], MD;  Location: [facility];  Service: General;;  . LEFT HEART CATHETERIZATION WITH CORONARY ANGIOGRAM N/A 06/11/2012   Procedure: LEFT HEART CATHETERIZATION WITH CORONARY ANGIOGRAM;  Surgeon: [name], MD;  Location: [facility] CATH LAB;  Service: Cardiovascular;  Laterality: N/A;  . LEFT HEART CATHETERIZATION WITH CORONARY ANGIOGRAM N/A 04/18/2013   Procedure: LEFT HEART CATHETERIZATION WITH CORONARY ANGIOGRAM;  Surgeon: [name], MD;  [facility] CATH LAB;  Service: Cardiovascular;  Laterality: N/A;  . LEFT HEART CATHETERIZATION WITH CORONARY ANGIOGRAM N/A 08/15/2014   Procedure: LEFT HEART CATHETERIZATION WITH CORONARY ANGIOGRAM;  Surgeon: [name], MD;  Location: [facility] CATH LAB;  Service: Cardiovascular;  Laterality: N/A;  . PERCUTANEOUS CORONARY STENT INTERVENTION (PCI-S) N/A 07/06/2012   Procedure: PERCUTANEOUS CORONARY STENT INTERVENTION (PCI-S);  Surgeon: [name], MD;  Location: [facility] CATH LAB;  Service: Cardiovascular;  Laterality: N/A;  . SEPTOPLASTY  2001  . TONSILLECTOMY    . TRANSTHORACIC ECHOCARDIOGRAM  08/15/2014   Moderate concentric LVH. EF 60-65% with no regional WMA. Gr 1 DD, otherwise normal  Home Medications    Prior to Admission medications   Medication Sig Start Date End Date Taking? Authorizing Provider  aspirin EC 81 MG tablet Take 1 tablet (81 mg total) by mouth daily. 07/19/18   Erlene Quan, PA-C  Empagliflozin-Metformin HCl ER (SYNJARDY XR) 25-1000 MG TB24 Take 1 tablet by mouth daily.    [provider]  Evolocumab (REPATHA SURECLICK) 086 MG/ML SOAJ Inject 140 mg into the skin every 14 (fourteen) days. 09/09/18   [name], MD  furosemide (LASIX) 40 MG tablet TAKE 1 TABLET(40 MG) BY MOUTH TWICE DAILY 10/06/18   Croitoru, Dani Gobble, MD  ibuprofen (ADVIL,MOTRIN) 200 MG tablet Take 600 mg by mouth every 6 (six) hours as needed for headache or moderate pain.      [provider]  metoprolol tartrate (LOPRESSOR) 25 MG tablet TAKE DAILY AS NEEDED FOR PALPITATIONS 11/10/17   Almyra Deforest, PA  Nebivolol HCl (BYSTOLIC) 20 MG TABS Take 1 tablet (20 mg total) by mouth daily. 09/02/18   Erlene Quan, PA-C  nitroGLYCERIN (NITROSTAT) 0.4 MG SL tablet Place 1 tablet (0.4 mg total) under the tongue every 5 (five) minutes x 3 doses as needed for chest pain. 07/19/18   Erlene Quan, PA-C  olmesartan (BENICAR) 40 MG tablet Take 1 tablet (40 mg total) by mouth daily. 10/18/18   Croitoru, Mihai, MD  Polyethyl Glycol-Propyl Glycol (SYSTANE OP) Place 1 drop into both eyes daily as needed (dry eyes).    [provider]  Potassium 99 MG TABS Take 1 tablet by mouth daily.    [provider]    Family History Family History  Problem Relation Age of Onset  . Atrial fibrillation Mother   . Mitral valve prolapse Mother   . Coronary artery disease Father        CABG, aortic aneursym,  . Heart attack Father   . Heart attack Paternal Uncle   . Heart attack Paternal Grandfather     Social History Social History   Tobacco Use  . Smoking status: Former Smoker    Packs/day: 1.00    Years: 15.00    Pack years: 15.00    Types: Cigarettes    Last attempt to quit: 06/30/2010    Years since quitting: 8.3  . Smokeless tobacco: Current User    Types: Snuff  Substance Use Topics  . Alcohol use: Yes    Alcohol/week: 7.0 standard drinks    Types: 7 Shots of liquor per week    Comment: a  glass of scotch per day  . Drug use: No     Allergies   Crestor [rosuvastatin]; Zofran [ondansetron hcl]; Ace inhibitors; Lipitor [atorvastatin]; and Codeine   Review of Systems Review of Systems  All other systems reviewed and are negative.   Physical Exam Updated Vital Signs BP (!) 153/82 (BP Location: Right Arm)   Pulse 64   Temp 98.7 F (37.1 C) (Oral)   Resp 17   SpO2 97%   Physical Exam Vitals signs and nursing note reviewed.  Constitutional:       General: He is not in acute distress.    Appearance: He is well-developed.  HENT:     Head: Normocephalic and atraumatic.     Mouth/Throat:     Pharynx: No oropharyngeal exudate.  Eyes:     General: No scleral icterus.       Right eye: No discharge.        Left eye: No discharge.  Conjunctiva/sclera: Conjunctivae normal.     Pupils: Pupils are equal, round, and reactive to light.  Neck:     Musculoskeletal: Normal range of motion and neck supple.     Thyroid: No thyromegaly.     Vascular: No JVD.  Cardiovascular:     Rate and Rhythm: Normal rate and regular rhythm.     Heart sounds: Normal heart sounds. No murmur. No friction rub. No gallop.   Pulmonary:     Effort: Pulmonary effort is normal. No respiratory distress.     Breath sounds: Normal breath sounds. No wheezing or rales.  Abdominal:     General: Bowel sounds are normal. There is no distension.     Palpations: Abdomen is soft. There is no mass.     Tenderness: There is no abdominal tenderness.  Musculoskeletal: Normal range of motion.        General: No tenderness.     Left lower leg: Edema ( 1+ symmetrical pitting edema of the pretibial space) present.  Lymphadenopathy:     Cervical: No cervical adenopathy.  Skin:    General: Skin is warm and dry.     Findings: No erythema or rash.  Neurological:     Mental Status: He is alert.     Coordination: Coordination normal.  Psychiatric:        Behavior: Behavior normal.      ED Treatments / Results  Labs (all labs ordered are listed, but only abnormal results are displayed) Labs Reviewed  BASIC METABOLIC PANEL - Abnormal; Notable for the following components:      Result Value   Glucose, Bld 127 (*)    Creatinine, Ser 1.33 (*)    All other components within normal limits  CBC  I-STAT TROPONIN, ED    EKG EKG Interpretation  Date/Time:  Monday October 18 2018 11:46:34 EDT Ventricular Rate:  64 PR Interval:  184 QRS Duration: 78 QT Interval:  404  QTC Calculation: 416 R Axis:   30 Text Interpretation:  Normal sinus rhythm Low voltage QRS Borderline ECG since last tracing no significant change Confirmed by Noemi Chapel [phone]) on 10/18/2018 2:49:53 PM   Radiology Dg Chest 2 View  Result Date: 10/18/2018 CLINICAL DATA:  Chest pressure today, hypertensive for a week, history coronary artery disease post angioplasty/stenting, type II diabetes mellitus, hyperlipidemia, essential hypertension, prior NSTEMI EXAM: CHEST - 2 VIEW COMPARISON:  04/02/2018 FINDINGS: Normal heart size, mediastinal contours, and pulmonary vascularity. Coronary stent noted. Lungs clear. No pulmonary infiltrate, pleural effusion or pneumothorax. Embolization coils project at liver. No acute osseous findings. IMPRESSION: No acute abnormalities. Electronically Signed   By: Lavonia Dana M.D.   On: 10/18/2018 12:23    Procedures Procedures (including critical care time)  Medications Ordered in ED Medications  sodium chloride flush (NS) 0.9 % injection 3 mL (has no administration in time range)     Initial Impression / Assessment and Plan / ED Course  I have reviewed the triage vital signs and the nursing notes.  Pertinent labs & imaging results that were available during my care of the patient were reviewed by me and considered in my medical decision making (see chart for details).        The patient's testing is reassuring, his creatinine is the same as it was 6 months ago and otherwise his electrolytes CBC and troponin are all normal and undetectable.  EKG is reassuring without any signs of ischemia or arrhythmia and the patient's vital signs are also reassuring  with a blood pressure of 140/79.  I agree with increasing his evening dose of blood pressure medication, the patient is stable for discharge and agreeable to the plan.  I do not think any further intervention is needed at this time, he has no headache neurologic symptoms chest pain or decreasing renal  function.  Final Clinical Impressions(s) / ED Diagnoses   Final diagnoses:  Essential hypertension      Noemi Chapel, MD 10/18/18 1507

## 2018-10-18 NOTE — Telephone Encounter (Signed)
Spoke with patient and he has been noticing his blood pressure going up the las week in the evening. Last night blood pressure 135/99 and 165/91. He did take a Metoprolol tart and blood pressure down to 120/75 before bed. This am his blood pressure 175/109 and 165/100 HR 69, he is feeling a little lightheaded. He did have his morning medications around 7:00. Discussed with Alena Bills D and ok for him to take another Metoprolol now since he has with him at work and increase Olmesartan to 40 mg daily. Continue to monitor and if no better or worse go to ED for evaluation. Call back with update on blood pressure. Advised patient, verbalized understanding. Did leave message for patient to call back to verify Bystolic dose, once or twice a day.

## 2019-01-13 ENCOUNTER — Emergency Department (HOSPITAL_COMMUNITY): Payer: BC Managed Care – PPO

## 2019-01-13 ENCOUNTER — Other Ambulatory Visit: Payer: Self-pay

## 2019-01-13 ENCOUNTER — Emergency Department (HOSPITAL_COMMUNITY)
Admission: EM | Admit: 2019-01-13 | Discharge: 2019-01-13 | Disposition: A | Discharge status: Home / Self Care | Payer: BC Managed Care – PPO | Attending: Emergency Medicine | Admitting: Emergency Medicine

## 2019-01-13 ENCOUNTER — Encounter (HOSPITAL_COMMUNITY): Payer: Self-pay | Admitting: Emergency Medicine

## 2019-01-13 ENCOUNTER — Telehealth: Payer: Self-pay | Admitting: Cardiovascular Disease

## 2019-01-13 DIAGNOSIS — I252 Old myocardial infarction: Secondary | ICD-10-CM | POA: Insufficient documentation

## 2019-01-13 DIAGNOSIS — Z7984 Long term (current) use of oral hypoglycemic drugs: Secondary | ICD-10-CM | POA: Insufficient documentation

## 2019-01-13 DIAGNOSIS — F17290 Nicotine dependence, other tobacco product, uncomplicated: Secondary | ICD-10-CM | POA: Insufficient documentation

## 2019-01-13 DIAGNOSIS — Z7982 Long term (current) use of aspirin: Secondary | ICD-10-CM | POA: Insufficient documentation

## 2019-01-13 DIAGNOSIS — E669 Obesity, unspecified: Secondary | ICD-10-CM | POA: Diagnosis not present

## 2019-01-13 DIAGNOSIS — E119 Type 2 diabetes mellitus without complications: Secondary | ICD-10-CM | POA: Diagnosis not present

## 2019-01-13 DIAGNOSIS — R079 Chest pain, unspecified: Secondary | ICD-10-CM | POA: Diagnosis not present

## 2019-01-13 DIAGNOSIS — Z6836 Body mass index (BMI) 36.0-36.9, adult: Secondary | ICD-10-CM | POA: Insufficient documentation

## 2019-01-13 DIAGNOSIS — R51 Headache: Secondary | ICD-10-CM | POA: Diagnosis present

## 2019-01-13 DIAGNOSIS — R42 Dizziness and giddiness: Secondary | ICD-10-CM | POA: Diagnosis not present

## 2019-01-13 DIAGNOSIS — G43909 Migraine, unspecified, not intractable, without status migrainosus: Secondary | ICD-10-CM | POA: Diagnosis not present

## 2019-01-13 DIAGNOSIS — I10 Essential (primary) hypertension: Secondary | ICD-10-CM | POA: Diagnosis not present

## 2019-01-13 LAB — CBC
HCT: 45.5 % (ref 39.0–52.0)
Hemoglobin: 15.3 g/dL (ref 13.0–17.0)
MCH: 29.9 pg (ref 26.0–34.0)
MCHC: 33.6 g/dL (ref 30.0–36.0)
MCV: 88.9 fL (ref 80.0–100.0)
Platelets: 210 10*3/uL (ref 150–400)
RBC: 5.12 MIL/uL (ref 4.22–5.81)
RDW: 12.4 % (ref 11.5–15.5)
WBC: 7.5 10*3/uL (ref 4.0–10.5)
nRBC: 0 % (ref 0.0–0.2)

## 2019-01-13 LAB — TROPONIN I: Troponin I: <0.03 ng/mL (ref <0.03)

## 2019-01-13 LAB — BASIC METABOLIC PANEL
Anion gap: 11 (ref 5–15)
BUN: 15 mg/dL (ref 6–20)
CO2: 27 mmol/L (ref 22–32)
Calcium: 9.1 mg/dL (ref 8.9–10.3)
Chloride: 101 mmol/L (ref 98–111)
Creatinine, Ser: 1.11 mg/dL (ref 0.61–1.24)
GFR calc Af Amer: >60 mL/min (ref >60)
GFR calc non Af Amer: >60 mL/min (ref >60)
Glucose, Bld: 129 mg/dL — ABNORMAL HIGH (ref 70–99)
Potassium: 3.5 mmol/L (ref 3.5–5.1)
Sodium: 139 mmol/L (ref 135–145)

## 2019-01-13 MED ORDER — SODIUM CHLORIDE 0.9% FLUSH: 3.0000 mL | Freq: Once | INTRAVENOUS | Status: DC

## 2019-01-13 NOTE — ED Triage Notes (Signed)
Patient reports increase in blood pressures (as high as 200/100) x 3 days - states he's also had headache and lightheadedness. Denies vision changes, extremity weakness, chest pain, or shortness of breath. He also endorses mid back pain, unsure if he did anything to cause it. Resp e/u, skin w/d. Extensive cardiac history.

## 2019-01-13 NOTE — Telephone Encounter (Signed)
Spoke with patient who is in ED and has had EKG performed. No further assistance needed at this time

## 2019-01-13 NOTE — Telephone Encounter (Signed)
Pt c/o BP issue: STAT if pt c/o blurred vision, one-sided weakness or slurred speech  1. What are your last 5 BP readings? 202/112  2. Are you having any other symptoms (ex. Dizziness, headache, blurred vision, passed out)? Dizzy, pain in middle of his back just below his shoulder blades.   3. What is your BP issue? High BP.  Patient states he is heading over to [facility] hospital right now as he was calling us.

## 2019-01-13 NOTE — Discharge Instructions (Addendum)
Your labs, chest x-ray, EKG, and exam today are very reassuring.  Your blood pressure is normal in the ED today. It is recommended that you check the accuracy of your blood pressure cuff at home if you had concerns about this.  It is recommended you keep a blood pressure log and bring this to your follow-up appointment with your primary care provider/cardiologist. If you develop severely worsening headache, vision changes, or new or concerning symptoms, please report back to the emergency department for further evaluation.

## 2019-01-13 NOTE — ED Provider Notes (Signed)
[facility] EMERGENCY DEPARTMENT Provider Note   CSN: [id number] Arrival date & time: 01/13/19  1531    History   Chief Complaint Chief Complaint  Patient presents with   Hypertension   Headache    HPI SELDON BARRELL is a 53 y.o. male with past medical history of hypertension, MI status post 3 stents, embolic stroke, 2 diabetes, presenting to the emergency department with complaint of hypertension.  Patient states today he had the feeling that his blood pressure may be high as he has some sensations in his hands and face when his blood pressure is elevated.  He took his blood pressure and and it was 401 systolic this morning.  He reports compliance with his daily hypertension medications.  He states later at work he felt the same way and rechecked his blood pressure, he states it was about 200/100.  He states he called his cardiologist, however did not hear from him until he was Artie checked in the ED.  He states he has a mild dull posterior headache, however this is no different or worse than previous headaches.  He denies vision changes, chest pain, shortness of breath, abdominal pain, new or worsening swelling in his extremities.  Upon arrival to the ED his blood pressure was noted to be 145/90.  He states he questions if his blood pressure cuff at home is accurate.  He does endorse recent increased stress, a family member simply passed and he went to the funeral yesterday.  He also denies any recent meals high in sodium.     The history is provided by the patient.    Past Medical History:  Diagnosis Date   CAD S/P percutaneous coronary angioplasty 07/2010; 07/2012; 08/2014   1) 2/'11: MI - 100% RCA - PCI (3 Taxus ION DES 3.0 x 28, 3.0 x 24 & 3.5 x 12 distal-prox); b) ISR of RCA stent - -AnngioSculpt PTCA; c) NSTEMI 1/'16: mild ISR with Thrombosis of RCA Stents (in setting of Mesenteric Vein Thrombosis) - Cutting Balloon PTCA; c. Re-look Cath 07/2015 & 07/2016:  ~15% ISR in RCA,20% p-mLAD & o-pCx.   Coronary stent thrombosis 08/15/2014   DM type 2 (diabetes mellitus, type 2) ([place]) 06/07/2017   Essential hypertension 07/07/2012   Finding of multiple premature atrial contractions by electrocardiography    Symptomatic palpitations ; worse with poor sleep and fatigue along with dehydration.    Hyperlipidemia with target LDL less than 70 09/01/2014   Mesenteric vein thrombosis    Migraines    "maybe one/yr" (07/06/2012)   NSTEMI (non-ST elevated myocardial infarction) ([place]) 07/2010, 08/2014   And unstable angina in February 20 13th; Echo 1/12/'16: Moderate concentric LVH. EF 60-65% with no regional WMA. Gr 1 DD, otherwise normal   Stroke [facility]) 2011; 2013   S/P cardiac cath - embolic stroke; denies residual (07/06/2012)   Visual loss, 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 04/19/2013   No evidence of CVA on MRI/MRA & Dliated Eye Examination. ruptured blood vessel & not occlusion.    Patient Active Problem List   Diagnosis Date Noted   DM type 2 (diabetes mellitus, type 2) ([place]) 06/07/2017   Coronary artery disease involving native coronary artery of native heart    Chest pain on exertion 06/06/2017   Medication management 02/72/5366   Diastolic dysfunction 44/10/4740   Finding of multiple premature atrial contractions by electrocardiography 10/05/2015  Iron deficiency anemia 09/29/2014   Obesity (BMI 30-39.9) 09/09/2014   Perforation bowel - 2/2 Mesenteric Ischemia from Mesenteric Vein Thrombosis.    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 04/19/2013   Possible Retinal artery branch occlusion of right eye = POST CATHETERIZATION -- by dilated Eye exam - no evidence of embolic occlusoin; possible microperforation. NOT CVA 04/19/2013    Class: Acute   Hypertriglyceridemia - TG >500 04/19/2013   Dyslipidemia, statin intol 07/07/2012   Essential hypertension 07/07/2012   Smoker, quit 06/30/10 07/07/2012   CAD S/P percutaneous coronary angioplasty 07/06/2012   H/O: CVA (cerebrovascular accident) 07/06/2012   NSTEMI (non-ST elevated myocardial infarction) - 07/2010, 2013, 08/2014 07/04/2010    Past Surgical History:  Procedure Laterality Date   BOWEL RESECTION N/A 08/28/2014   Procedure: SMALL BOWEL RESECTION;  Surgeon: [name], MD;  Location: [facility];  Service: General;  Laterality: N/A;   CARDIAC CATHETERIZATION  04/18/2013   CARDIAC CATHETERIZATION N/A 07/20/2015   Procedure: Left Heart Cath and Coronary Angiography;  Surgeon: [name], MD;  Location: [facility];  Service: Cardiovascular;  15% ISR in the RCA stent segment, 20% proximal-mid LAD as well as ostial and proximal circumflex. Normal LV function.   CARDIAC CATHETERIZATION N/A 07/23/2016   Procedure: Left Heart Cath and Coronary Angiography;  Surgeon: [name], MD;  Location: [facility];  Service: Cardiovascular: E 55-65%. ~15% diffuse RCA ISR, ~20% diffuse pLAD & pCx   CORONARY ANGIOPLASTY  07/06/2012; 08/15/2014   a) 12/'13: PTCA of 80% ISR mRCA - AngioSculpt; b) PTCA of  ISR/thrombosis   CORONARY ANGIOPLASTY WITH STENT PLACEMENT  07/2010   inferior wall MI - 3 Taxus Ion DES (3.0x7mm, 3.0x41mm, 3.5x72mm) to prox and mid RCA   [place] N/A 10/15/2017   Procedure: HERNIA REPAIR INCISIONAL;  Surgeon: [name], MD;  Location: [facility];  Service: General;  Laterality: N/A;   INSERTION OF MESH N/A 10/15/2017   Procedure: INSERTION OF MESH;  Surgeon: [name],  MD;  Location: [facility];  Service: General;  Laterality: N/A;   LAPAROSCOPIC APPENDECTOMY N/A 08/28/2014   Procedure: APPENDECTOMY LAPAROSCOPIC;  Surgeon: [name], MD;  Location: [facility];  Service: General;  Laterality: N/A;   LAPAROSCOPY  08/28/2014   Procedure: LAPAROSCOPY DIAGNOSTIC;  Surgeon: [name], MD;  Location: [facility];  Service: General;;   LEFT HEART CATHETERIZATION WITH CORONARY ANGIOGRAM N/A 06/11/2012   Procedure: LEFT HEART CATHETERIZATION WITH CORONARY ANGIOGRAM;  Surgeon: [name], MD;  Location: [facility] CATH LAB;  Service: Cardiovascular;  Laterality: N/A;   LEFT HEART CATHETERIZATION WITH CORONARY ANGIOGRAM N/A 04/18/2013   Procedure: LEFT HEART CATHETERIZATION WITH CORONARY ANGIOGRAM;  Surgeon: [name], MD;  Location: [facility] C[facility];  Service: Cardiovascular;  Laterality: N/A;   LEFT HEART CATHETERIZATION WITH CORONARY ANGIOGRAM N/A 08/15/2014   Procedure: LEFT HEART CATHETERIZATION WITH CORONARY ANGIOGRAM;  Surgeon: [name], MD;  Location: [facility] CATH LAB;  Service: Cardiovascular;  Laterality: N/A;   PERCUTANEOUS CORONARY STENT INTERVENTION (PCI-S) N/A 07/06/2012   Procedure: PERCUTANEOUS CORONARY STENT INTERVENTION (PCI-S);  Surgeon: [name], MD;  Location: [facility]  CATH LAB;  Service: Cardiovascular;  Laterality: N/A;   SEPTOPLASTY  2001   TONSILLECTOMY     TRANSTHORACIC ECHOCARDIOGRAM  08/15/2014   Moderate concentric LVH. EF 60-65% with no regional WMA. Gr 1 DD, otherwise normal        Home Medications    Prior to Admission medications   Medication Sig Start Date End Date Taking? Authorizing Provider  aspirin EC 81 MG tablet Take 1 tablet (81 mg total) by mouth daily. 07/19/18   Erlene Quan, PA-C  Empagliflozin-Metformin HCl ER (SYNJARDY XR) 25-1000 MG TB24 Take 1 tablet by mouth daily.    [provider]  Evolocumab (REPATHA SURECLICK) 710 MG/ML SOAJ Inject 140 mg into the skin every 14 (fourteen) days. 09/09/18   [name], MD  furosemide (LASIX) 40 MG tablet TAKE 1 TABLET(40 MG) BY MOUTH TWICE DAILY 10/06/18   Croitoru, Dani Gobble, MD  ibuprofen (ADVIL,MOTRIN) 200 MG tablet Take 600 mg by mouth every 6 (six) hours as needed for headache or moderate pain.     [provider]  metoprolol tartrate (LOPRESSOR) 25 MG tablet TAKE DAILY AS NEEDED FOR PALPITATIONS 11/10/17   Almyra Deforest, PA  Nebivolol HCl (BYSTOLIC) 20 MG TABS Take 1 tablet (20 mg total) by mouth daily. 09/02/18   Erlene Quan, PA-C  nitroGLYCERIN (NITROSTAT) 0.4 MG SL tablet Place 1 tablet (0.4 mg total) under the tongue every 5 (five) minutes x 3 doses as needed for chest pain. 07/19/18   Erlene Quan, PA-C  olmesartan (BENICAR) 40 MG tablet Take 1 tablet (40 mg total) by mouth daily. 10/18/18   Croitoru, Mihai, MD  Polyethyl Glycol-Propyl Glycol (SYSTANE OP) Place 1 drop into both eyes daily as needed (dry eyes).    [provider]  Potassium 99 MG TABS Take 1 tablet by mouth daily.    [provider]    Family History Family History  Problem Relation Age of Onset   Atrial fibrillation Mother    Mitral valve prolapse Mother    Coronary artery disease Father        CABG, aortic aneursym,   Heart attack Father    Heart attack Paternal Uncle    Heart attack Paternal Grandfather     Social History Social History   Tobacco Use   Smoking status: Former Smoker    Packs/day: 1.00    Years: 15.00    Pack years: 15.00    Types: Cigarettes    Quit date: 06/30/2010    Years since quitting: 8.5   Smokeless tobacco: Current User    Types: Snuff  Substance Use Topics   Alcohol use: Yes    Alcohol/week: 7.0 standard drinks    Types: 7 Shots of liquor per week    Comment: a  glass of scotch per day   Drug use: No     Allergies   Crestor [rosuvastatin], Zofran [ondansetron hcl], Ace inhibitors, Lipitor [atorvastatin], and Codeine   Review of Systems Review of Systems  Eyes: Negative for photophobia and  visual disturbance.  Respiratory: Negative for shortness of breath.   Cardiovascular: Negative for chest pain.  Gastrointestinal: Negative for abdominal pain.  Neurological: Positive for headaches. Negative for syncope, weakness and numbness.  All other systems reviewed and are negative.    Physical Exam Updated Vital Signs BP 131/71    Pulse 70    Temp 98.5 F (36.9 C) (Oral)    Resp (!) 21    Ht 6\' 1"  (1.854 m)  Wt 124.7 kg    SpO2 96%    BMI 36.28 kg/m   Physical Exam Vitals signs and nursing note reviewed.  Constitutional:      General: He is not in acute distress.    Appearance: He is well-developed. He is obese. He is not ill-appearing.  HENT:     Head: Normocephalic and atraumatic.  Eyes:     Conjunctiva/sclera: Conjunctivae normal.  Neck:     Musculoskeletal: Normal range of motion and neck supple.  Cardiovascular:     Rate and Rhythm: Normal rate and regular rhythm.  Pulmonary:     Effort: Pulmonary effort is normal. No respiratory distress.     Breath sounds: Normal breath sounds.  Abdominal:     General: Bowel sounds are normal.     Palpations: Abdomen is soft.     Tenderness: There is no abdominal tenderness. There is no guarding.  Skin:    General: Skin is warm.  Neurological:     Mental Status: He is alert.     Comments: Mental Status:  Alert, oriented, thought content appropriate, able to give a coherent history. Speech fluent without evidence of aphasia. Able to follow 2 step commands without difficulty.  Cranial Nerves:  II:  Peripheral visual fields grossly normal, pupils equal, round, reactive to light III,IV, VI: ptosis not present, extra-ocular motions intact bilaterally  V,VII: smile symmetric, facial light touch sensation equal VIII: hearing grossly normal to voice  X: uvula elevates symmetrically  XI: bilateral shoulder shrug symmetric and strong XII: midline tongue extension without fassiculations Motor:  Normal tone. 5/5 in upper and lower  extremities bilaterally including strong and equal grip strength and dorsiflexion/plantar flexion Sensory: Pinprick and light touch normal in all extremities.  Cerebellar: normal finger-to-nose with bilateral upper extremities No pronator drift CV: distal pulses palpable throughout    Psychiatric:        Behavior: Behavior normal.      ED Treatments / Results  Labs (all labs ordered are listed, but only abnormal results are displayed) Labs Reviewed  BASIC METABOLIC PANEL - Abnormal; Notable for the following components:      Result Value   Glucose, Bld 129 (*)    All other components within normal limits  CBC  TROPONIN I    EKG EKG Interpretation  Date/Time:  Thursday January 13 2019 15:41:02 EDT Ventricular Rate:  71 PR Interval:  154 QRS Duration: 84 QT Interval:  404 QTC Calculation: 439 R Axis:   2 Text Interpretation:  Normal sinus rhythm Cannot rule out Anterior infarct , age undetermined Abnormal ECG No significant change since last tracing Confirmed by Theotis Burrow [phone]) on 01/13/2019 4:41:17 PM   Radiology Dg Chest 2 View  Result Date: 01/13/2019 CLINICAL DATA:  Chest pain and lightheadedness for 1 day. EXAM: CHEST - 2 VIEW COMPARISON:  10/18/2010 FINDINGS: The cardiac silhouette, mediastinal and hilar contours are within normal limits and stable. The lungs are clear. No pleural effusion. No worrisome pulmonary lesions. The bony thorax is intact. Again noted are vascular occlusion coils in the right upper quadrant. IMPRESSION: No acute cardiopulmonary findings. Electronically Signed   By: Marijo Sanes M.D.   On: 01/13/2019 16:14    Procedures Procedures (including critical care time)  Medications Ordered in ED Medications  sodium chloride flush (NS) 0.9 % injection 3 mL (3 mLs Intravenous Not Given 01/13/19 1710)     Initial Impression / Assessment and Plan / ED Course  I have reviewed the triage vital  signs and the nursing notes.  Pertinent labs &  imaging results that were available during my care of the patient were reviewed by me and considered in my medical decision making (see chart for details).        Patient presenting with hypertension, with recent stressors/death in the family. Pt with reassuring vital signs in the ED, he questions whether or not his cuff at home is accurate. As he had readings of 295 systolic just before arrival to the ED, and BP on arrival here is 145/90. On evaluation, BP 131/71. He complains of mild dull posterior headache, no worse than previous headaches. No vision changes or photophobia. No CP, SOB, or abd complaints. No signs of hypertensive urgency. Normal neuro exam. Labs obtained in triage reveal improved Cr from previous, neg trop, neg CXR, unchanged EKG. At this time, no further workup is indicated. Will discharge with instruction to check BP cuff for accuracy, and keep BP log to bring to PCP/cardiologist for f/u.  Return precautions discussed.  Patient agreeable to plan and safe for discharge.    Patient discussed with Dr. Rex Kras, who agrees with care plan and discharge.  Discussed results, findings, treatment and follow up. Patient advised of return precautions. Patient verbalized understanding and agreed with plan.   Final Clinical Impressions(s) / ED Diagnoses   Final diagnoses:  Hypertension, unspecified type    ED Discharge Orders    None       Graham Doukas, [place] N, PA-C 01/13/19 1721    Little, Wenda Overland, MD 01/13/19 2130

## 2019-01-13 NOTE — ED Notes (Signed)
Discharge instructions reviewed with pt. Pt verbalized understanding. Pt ambulatory at discharge. Pt left with all belongings. Opportunity for questions provided.

## 2019-04-04 ENCOUNTER — Other Ambulatory Visit: Payer: Self-pay

## 2019-04-04 MED ORDER — OLMESARTAN MEDOXOMIL 40 MG PO TABS: 40.0000 mg | ORAL_TABLET | Freq: Every day | ORAL | 5 refills | Status: DC

## 2019-04-28 DIAGNOSIS — M25562 Pain in left knee: Secondary | ICD-10-CM | POA: Diagnosis not present

## 2019-04-28 DIAGNOSIS — M13862 Other specified arthritis, left knee: Secondary | ICD-10-CM | POA: Diagnosis not present

## 2019-05-17 DIAGNOSIS — M1712 Unilateral primary osteoarthritis, left knee: Secondary | ICD-10-CM | POA: Diagnosis not present

## 2019-05-17 DIAGNOSIS — I10 Essential (primary) hypertension: Secondary | ICD-10-CM | POA: Diagnosis not present

## 2019-05-17 DIAGNOSIS — Z0000 Encounter for general adult medical examination without abnormal findings: Secondary | ICD-10-CM | POA: Diagnosis not present

## 2019-05-17 DIAGNOSIS — I152 Hypertension secondary to endocrine disorders: Secondary | ICD-10-CM | POA: Diagnosis not present

## 2019-05-17 DIAGNOSIS — Z125 Encounter for screening for malignant neoplasm of prostate: Secondary | ICD-10-CM | POA: Diagnosis not present

## 2019-05-17 DIAGNOSIS — E1151 Type 2 diabetes mellitus with diabetic peripheral angiopathy without gangrene: Secondary | ICD-10-CM | POA: Diagnosis not present

## 2019-05-17 DIAGNOSIS — E1159 Type 2 diabetes mellitus with other circulatory complications: Secondary | ICD-10-CM | POA: Diagnosis not present

## 2019-05-17 DIAGNOSIS — I25118 Atherosclerotic heart disease of native coronary artery with other forms of angina pectoris: Secondary | ICD-10-CM | POA: Diagnosis not present

## 2019-05-24 DIAGNOSIS — R972 Elevated prostate specific antigen [PSA]: Secondary | ICD-10-CM | POA: Diagnosis not present

## 2019-05-25 ENCOUNTER — Encounter: Payer: Self-pay | Admitting: Medical

## 2019-05-25 ENCOUNTER — Other Ambulatory Visit: Payer: Self-pay

## 2019-05-25 ENCOUNTER — Ambulatory Visit (INDEPENDENT_AMBULATORY_CARE_PROVIDER_SITE_OTHER): Payer: BC Managed Care – PPO | Admitting: Medical

## 2019-05-25 VITALS — BP 132/80 | HR 57 | Ht 73.0 in | Wt 253.0 lb

## 2019-05-25 DIAGNOSIS — I10 Essential (primary) hypertension: Secondary | ICD-10-CM

## 2019-05-25 DIAGNOSIS — E119 Type 2 diabetes mellitus without complications: Secondary | ICD-10-CM

## 2019-05-25 DIAGNOSIS — I2510 Atherosclerotic heart disease of native coronary artery without angina pectoris: Secondary | ICD-10-CM | POA: Diagnosis not present

## 2019-05-25 DIAGNOSIS — E785 Hyperlipidemia, unspecified: Secondary | ICD-10-CM

## 2019-05-25 DIAGNOSIS — I712 Thoracic aortic aneurysm, without rupture: Secondary | ICD-10-CM | POA: Diagnosis not present

## 2019-05-25 DIAGNOSIS — I7121 Aneurysm of the ascending aorta, without rupture: Secondary | ICD-10-CM

## 2019-05-25 DIAGNOSIS — Z8673 Personal history of transient ischemic attack (TIA), and cerebral infarction without residual deficits: Secondary | ICD-10-CM

## 2019-05-25 DIAGNOSIS — Z794 Long term (current) use of insulin: Secondary | ICD-10-CM

## 2019-05-25 MED ORDER — NITROGLYCERIN 0.4 MG SL SUBL: 0.4000 mg | SUBLINGUAL_TABLET | SUBLINGUAL | 2 refills | Status: DC | PRN

## 2019-05-25 NOTE — Progress Notes (Signed)
Cardiology Office Note   Date:  05/25/2019   ID:  Dylan [name], DOB 12/03/1965, MRN [id number]  PCP:  Dylan Kern, PA  Cardiologist:  Dylan Klein, MD EP: None  Chief Complaint  Patient presents with   Follow-up    CAD      History of Present Illness: Dylan [name] is a 53 y.o. male with a PMH of CAD s/p PCI/DES to RCA in 2011 with in-stent restenosis 2013 and 2016 managed with PCI, HTN, HLD, DM type 2, palpitations 2/2 PACs, post-PCI CVA in 2011, who presents for routine follow-up of his CAD.  He was last evaluated by cardiology at an outpatient visit with Dylan Ransom, PA-C 07/2018. He was without cardiac complaints at that time but recently there had been a misunderstanding about whether or not patient needed to be on aspirin and/or plavix. Aspirin was recommended at that time. No other medication changes occurred. Since that time he has had 2 ER visits for elevated blood pressures, though not significantly elevated on evaluation on either occasion, drawing into question the accuracy of his home BP cuff. His last ischemic evaluation was a NST in 2018 which was without sichemia. His last LHC in 2017 showed 15% p-dRCA in-stent stenosis, and 20% p-mLAD and Ostial Cx to pLCx stenosis. His last echocardiogram in 2018 showed EF 60-65%, no RWMA, moderate LVH, mild ascending aortic aneurysm, mild LAE.   He returns today for follow-up of his CAD. He reports he has been doing quite well since his last visit. He has been focusing on diet and exercise and has lost 37lbs over the past year. He has a goal to lose another 20+ lbs. He was congratulated on this achievement. He has been walking ~5 miles a day. He reports improvement in his energy level. He has been able to stop his diabetes medications given A1C of 5.7 05/17/2019. He has two children ages 45 and 38 which motivate him to be healthy. He reports compliance with his lasix for management of LE edema which is generally worse in  the evening and resolve on waking in the morning. He denies recent chest pain, SOB, DOE, dizziness, lightheadedness, or syncope. He reports stopping his olmesartan after noticing he felt generally unwell in the afternoons with a swimmy headed sensation. This has resolved with cessation of olmesartan. His blood pressures have continued to be at goal of <130/80 with close monitoring at home.    Past Medical History:  Diagnosis Date   CAD S/P percutaneous coronary angioplasty 07/2010; 07/2012; 08/2014   1) 2/'11: MI - 100% RCA - PCI (3 Taxus ION DES 3.0 x 28, 3.0 x 24 & 3.5 x 12 distal-prox); b) ISR of RCA stent - -AnngioSculpt PTCA; c) NSTEMI 1/'16: mild ISR with Thrombosis of RCA Stents (in setting of Mesenteric Vein Thrombosis) - Cutting Balloon PTCA; c. Re-look Cath 07/2015 & 07/2016: ~15% ISR in RCA,20% p-mLAD & o-pCx.   Coronary stent thrombosis 08/15/2014   DM type 2 (diabetes mellitus, type 2) ([place]) 06/07/2017   Essential hypertension 07/07/2012   Finding of multiple premature atrial contractions by electrocardiography    Symptomatic palpitations ; worse with poor sleep and fatigue along with dehydration.    Hyperlipidemia with target LDL less than 70 09/01/2014   Mesenteric vein thrombosis (HCC)    Migraines    "maybe one/yr" (07/06/2012)   NSTEMI (non-ST elevated myocardial infarction) ([place]) 07/2010, 08/2014   And unstable angina in February 20 13th; Echo 1/12/'16: Moderate concentric LVH.  EF 60-65% with no regional WMA. 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 04/19/2013   No evidence of CVA on MRI/MRA & Dliated Eye Examination. ruptured blood vessel & not occlusion.    Past Surgical History:  Procedure Laterality Date   BOWEL RESECTION N/A 08/28/2014   Procedure:  SMALL BOWEL RESECTION;  Surgeon: Dylan Keens, MD;  Location: [facility];  Service: General;  Laterality: N/A;   CARDIAC CATHETERIZATION  04/18/2013   CARDIAC CATHETERIZATION N/A 07/20/2015   Procedure: Left Heart Cath and Coronary Angiography;  Surgeon: Dylan M Martinique, MD;  Location: [facility];  Service: Cardiovascular;  15% ISR in the RCA stent segment, 20% proximal-mid LAD as well as ostial and proximal circumflex. Normal LV function.   CARDIAC CATHETERIZATION N/A 07/23/2016   Procedure: Left Heart Cath and Coronary Angiography;  Surgeon: Dylan M Martinique, MD;  Location: [facility];  Service: Cardiovascular: E 55-65%. ~15% diffuse RCA ISR, ~20% diffuse pLAD & pCx   CORONARY ANGIOPLASTY  07/06/2012; 08/15/2014   a) 12/'13: PTCA of 80% ISR mRCA - AngioSculpt; b) PTCA of  ISR/thrombosis   CORONARY ANGIOPLASTY WITH STENT PLACEMENT  07/2010   inferior wall MI - 3 Taxus Ion DES (3.0x65mm, 3.0x66mm, 3.5x63mm) to prox and mid RCA   [place] N/A 10/15/2017   Procedure: HERNIA REPAIR INCISIONAL;  Surgeon: Dylan Keens, MD;  Location: [facility];  Service: General;  Laterality: N/A;   INSERTION OF MESH N/A 10/15/2017   Procedure: INSERTION OF MESH;  Surgeon: Dylan Keens, MD;  Location: [facility];  Service: General;  Laterality: N/A;   LAPAROSCOPIC APPENDECTOMY N/A 08/28/2014   Procedure: APPENDECTOMY LAPAROSCOPIC;  Surgeon: Dylan Keens, MD;  Location: [facility];  Service: General;  Laterality: N/A;   LAPAROSCOPY  08/28/2014   Procedure: LAPAROSCOPY DIAGNOSTIC;  Surgeon: Dylan Keens, MD;  Location: [facility];  Service: General;;   LEFT HEART CATHETERIZATION WITH CORONARY ANGIOGRAM N/A 06/11/2012   Procedure: LEFT HEART CATHETERIZATION WITH CORONARY ANGIOGRAM;  Surgeon: Dylan Klein, MD;  Location: [facility];  Service: Cardiovascular;  Laterality: N/A;   LEFT HEART CATHETERIZATION WITH CORONARY ANGIOGRAM N/A 04/18/2013   Procedure: LEFT HEART CATHETERIZATION WITH  CORONARY ANGIOGRAM;  Surgeon: Dylan Sine, MD;  Location: [facility] C[facility] LAB;  Service: Cardiovascular;  Laterality: N/A;   LEFT HEART CATHETERIZATION WITH CORONARY ANGIOGRAM N/A 08/15/2014   Procedure: LEFT HEART CATHETERIZATION WITH CORONARY ANGIOGRAM;  Surgeon: [name], MD;  Location: [facility] CATH LAB;  Service: Cardiovascular;  Laterality: N/A;   PERCUTANEOUS CORONARY STENT INTERVENTION (PCI-S) N/A 07/06/2012   Procedure: PERCUTANEOUS CORONARY STENT INTERVENTION (PCI-S);  Surgeon: [name], MD;  Location: [facility] C[facility] LAB;  Service: Cardiovascular;  Laterality: N/A;   SEPTOPLASTY  2001   TONSILLECTOMY     TRANSTHORACIC ECHOCARDIOGRAM  08/15/2014   Moderate concentric LVH. EF 60-65% with no regional WMA. Gr 1 DD, otherwise normal     Current Outpatient Medications  Medication Sig Dispense Refill   aspirin EC 81 MG tablet Take 1 tablet (81 mg total) by mouth daily. 90 tablet 3   Evolocumab (REPATHA SURECLICK) XX123456 MG/ML SOAJ Inject 140 mg into the skin every 14 (fourteen) days. 6 pen 3   furosemide (LASIX) 40 MG tablet TAKE 1 TABLET(40 MG)  BY MOUTH TWICE DAILY 180 tablet 3   ibuprofen (ADVIL,MOTRIN) 200 MG tablet Take 600 mg by mouth every 6 (six) hours as needed for headache or moderate pain.      metoprolol tartrate (LOPRESSOR) 25 MG tablet TAKE DAILY AS NEEDED FOR PALPITATIONS 45 tablet 3   Nebivolol HCl (BYSTOLIC) 20 MG TABS Take 1 tablet (20 mg total) by mouth daily. 90 tablet 3   nitroGLYCERIN (NITROSTAT) 0.4 MG SL tablet Place 1 tablet (0.4 mg total) under the tongue every 5 (five) minutes x 3 doses as needed for chest pain. 25 tablet 2   Polyethyl Glycol-Propyl Glycol (SYSTANE OP) Place 1 drop into both eyes daily as needed (dry eyes).     Potassium 99 MG TABS Take 1 tablet by mouth daily.     No current facility-administered medications for this visit.     Allergies:   Crestor [rosuvastatin], Zofran [ondansetron hcl], Ace inhibitors, Lipitor [atorvastatin], and  Codeine    Social History:  The patient  reports that he quit smoking about 8 years ago. His smoking use included cigarettes. He has a 15.00 pack-year smoking history. His smokeless tobacco use includes snuff. He reports current alcohol use of about 7.0 standard drinks of alcohol per week. He reports that he does not use drugs.   Family History:  The patient's family history includes Atrial fibrillation in his mother; Coronary artery disease in his father; Heart attack in his father, paternal grandfather, and paternal uncle; Mitral valve prolapse in his mother.    ROS:  Please see the history of present illness.   Otherwise, review of systems are positive for none.   All other systems are reviewed and negative.    PHYSICAL EXAM: VS:  BP 132/80    Pulse (!) 57    Ht 6\' 1"  (1.854 m)    Wt 253 lb (114.8 kg)    SpO2 97%    BMI 33.38 kg/m  , BMI Body mass index is 33.38 kg/m. GEN: Well nourished, well developed, in no acute distress HEENT: sclera anicteric Neck: no JVD, carotid bruits, or masses Cardiac: RRR; no murmurs, rubs, or gallops, 1+ LE edema  Respiratory:  clear to auscultation bilaterally, normal work of breathing GI: soft, nontender, nondistended, + BS MS: no deformity or atrophy Skin: warm and dry, no rash Neuro:  Strength and sensation are intact Psych: euthymic mood, full affect   EKG:  EKG is ordered today. The ekg ordered today demonstrates sinus bradycardia, rate 57 bpm, chronic T wave abnormalities in inferior leads, no STE/D; no significant change from previous   Recent Labs: 01/13/2019: BUN 15; Creatinine, Ser 1.11; Hemoglobin 15.3; Platelets 210; Potassium 3.5; Sodium 139    Lipid Panel    Component Value Date/Time   CHOL 123 08/25/2017 0805   TRIG 363 (H) 08/25/2017 0805   HDL 34 (L) 08/25/2017 0805   CHOLHDL 3.6 08/25/2017 0805   CHOLHDL 2.6 06/08/2017 0315   VLDL 62 (H) 06/08/2017 0315   LDLCALC 16 08/25/2017 0805      Wt Readings from Last 3  Encounters:  05/25/19 253 lb (114.8 kg)  01/13/19 275 lb (124.7 kg)  07/19/18 286 lb 8 oz (130 kg)      Other studies Reviewed: Additional studies/ records that were reviewed today include:   NST 2018  There was no ST segment deviation noted during stress.  The study is normal.  This is a low risk study.  The left ventricular ejection fraction is normal (55-65%).  Nuclear stress EF calculated as 55% although visually appears mildly reduced. Consider 2D echo to correlate.  Echocardiogram 2018: Study Conclusions  - Left ventricle: The cavity size was normal. Wall thickness was   increased in a pattern of moderate LVH. Systolic function was   normal. The estimated ejection fraction was in the range of 60%   to 65%. Wall motion was normal; there were no regional wall   motion abnormalities. There was no evidence of elevated   ventricular filling pressure by Doppler parameters. - Aorta: Ascending aortic diameter: 42 mm (S). - Ascending aorta: The ascending aorta was mildly dilated. - Mitral valve: There was trivial regurgitation. - Left atrium: The atrium was mildly dilated.  [place] 2017:  The left ventricular systolic function is normal.  LV end diastolic pressure is normal.  The left ventricular ejection fraction is 55-65% by visual estimate.  Prox RCA to Dist RCA lesion, 15 %stenosed.  Prox LAD to Mid LAD lesion, 20 %stenosed.  Ost Cx to Prox Cx lesion, 20 %stenosed.   1. Nonobstructive CAD- no change from one year ago 2. Normal LV function and LVEDP  Plan: continue medical management.   ASSESSMENT AND PLAN:  1. CAD s/p PCI/DES to RCA in 2011 with subsequent PCI for in-stent restenosis in 2013 and 2016: doing well from a cardiac standpoint. No anginal complaints.  - Continue aspirin and repatha - SL nitro prescription renewed and administration instructions reviewed.  2. HTN: BP 132/80 today. He reports BP is generally less than 130/80 at home. No longer on  olmesartan as he noticed feeling poorly in the afternoon after taking this medications which improved after cessation. Anticipate BP will continue to improve with continued dietary/lifestyle modifications  - Continue lasix and nebivolol  - Patient instructed to continue monitoring BP and notify the office if >130/80 as we may need to consider additional medications.    3. HLD: LDL 30 05/17/2019; intolerant to statins - Continue repatha  4. DM type 2: A1C 5.7 10/13/220. Oral empaglifozin-metformin recent stopped given improvement in glucose control and ongoing dietary/lifestyle modifications - Continue dietary/lifestyle modifications to promote good glycemic control  5. CVA: occurred post PCI in 2011.  - Continue aspirin and repatha  6. Ascending aortic aneurysm: mild on echo in 2018 - Will repeat an echo for close monitoring  - Continue aggressive BP control as above.    Current medicines are reviewed at length with the patient today.  The patient does not have concerns regarding medicines.  The following changes have been made:  no change  Labs/ tests ordered today include:   Orders Placed This Encounter  Procedures   EKG 12-Lead   ECHOCARDIOGRAM COMPLETE     Disposition:   FU with Dr. Sallyanne Kuster in 1 year  Signed, Abigail Butts, PA-C  05/25/2019 4:55 PM

## 2019-05-25 NOTE — Patient Instructions (Addendum)
Medication Instructions:  Your physician recommends that you continue on your current medications as directed. Please refer to the Current Medication list given to you today.  *If you need a refill on your cardiac medications before your next appointment, please call your pharmacy*   Testing/Procedures: Your physician has requested that you have an echocardiogram. Echocardiography is a painless test that uses sound waves to create images of your heart. It provides your doctor with information about the size and shape of your heart and how well your heart's chambers and valves are working. This procedure takes approximately one hour. There are no restrictions for this procedure.   Follow-Up: At [facility], you and your health needs are our priority.  As part of our continuing mission to provide you with exceptional heart care, we have created designated Provider Care Teams.  These Care Teams include your primary Cardiologist (physician) and Advanced Practice Providers (APPs -  Physician Assistants and Nurse Practitioners) who all work together to provide you with the care you need, when you need it.  Your next appointment:   12 months  The format for your next appointment:   Either In Person or Virtual  Provider:   You may see [name], MD or one of the following Advanced Practice Providers on your designated Care Team:    Almyra Deforest, PA-C  Fabian Sharp, [place] or   Roby Lofts, [place]   Other Instructions Your physician has requested that you regularly monitor and record your blood pressure readings at home. Please use the same machine at the same time of day to check your readings and record them to bring to your follow-up visit. Please call our office at [phone] if your blood pressure is staying consistently above 130/80.

## 2019-05-25 NOTE — Progress Notes (Signed)
I sent him a message congratulating him on losing weight.  Thank you

## 2019-05-31 ENCOUNTER — Other Ambulatory Visit: Payer: Self-pay

## 2019-05-31 ENCOUNTER — Ambulatory Visit (HOSPITAL_COMMUNITY): Payer: BC Managed Care – PPO | Attending: Cardiology

## 2019-05-31 DIAGNOSIS — I712 Thoracic aortic aneurysm, without rupture: Secondary | ICD-10-CM

## 2019-05-31 DIAGNOSIS — I7121 Aneurysm of the ascending aorta, without rupture: Secondary | ICD-10-CM

## 2019-06-02 NOTE — Progress Notes (Signed)
The patient has been notified of the result and verbalized understanding.  All questions (if any) were answered. Jacqulynn Cadet, [place] 06/02/2019 12:02 PM

## 2019-06-02 NOTE — Progress Notes (Signed)
Normal EF. No significant valvular abnormalities. Ascending aortic aneurysm is stable at 24mm.

## 2019-08-10 ENCOUNTER — Other Ambulatory Visit: Payer: Self-pay | Admitting: Cardiology

## 2019-08-10 MED ORDER — REPATHA SURECLICK 140 MG/ML ~~LOC~~ SOAJ: 140.0000 mg | SUBCUTANEOUS | 3 refills | Status: DC

## 2019-08-17 ENCOUNTER — Other Ambulatory Visit: Payer: Self-pay

## 2019-08-17 MED ORDER — REPATHA SURECLICK 140 MG/ML ~~LOC~~ SOAJ: 140.0000 mg | SUBCUTANEOUS | 3 refills | Status: DC

## 2019-08-18 ENCOUNTER — Other Ambulatory Visit: Payer: Self-pay

## 2019-08-18 ENCOUNTER — Telehealth: Payer: Self-pay | Admitting: Cardiovascular Disease

## 2019-08-18 MED ORDER — FUROSEMIDE 40 MG PO TABS: ORAL_TABLET | ORAL | 3 refills | Status: DC

## 2019-08-18 MED ORDER — BYSTOLIC 20 MG PO TABS: 20.0000 mg | ORAL_TABLET | Freq: Every day | ORAL | 2 refills | Status: DC

## 2019-08-18 NOTE — Telephone Encounter (Signed)
[id number] pcn:[id number] grp:[employer] NEW PA SUBMITTED

## 2019-08-18 NOTE — Telephone Encounter (Signed)
New Message    Pt c/o medication issue:  1. Name of Medication: Nebivolol HCl (BYSTOLIC) 20 MG TABS  Evolocumab (REPATHA SURECLICK) [id number] MG/ML SOAJ     2. How are you currently taking this medication (dosage and times per day)? 1 injection every 14 days 1x tablet daily   3. Are you having a reaction (difficulty breathing--STAT)? No   4. What is your medication issue? Pt says he is needing to get prior authorization for these medications

## 2019-08-18 NOTE — Telephone Encounter (Signed)
Correction I submitted pa for repatha I will route to lisa dr c's nurse for the bystolic

## 2019-08-18 NOTE — Telephone Encounter (Signed)
Spoke to patient he stated he changed insurances this year to [facility].Stated he needs prior authorization for repatha and bystolic.Advised I will send message to pharmacy and Dr.Croitoru's RN.I will also inform our insurance dept to call you to obtain your new insurance information.

## 2019-08-22 ENCOUNTER — Telehealth: Payer: Self-pay

## 2019-08-22 ENCOUNTER — Other Ambulatory Visit: Payer: Self-pay

## 2019-08-22 NOTE — Telephone Encounter (Signed)
Called to let the patient know that the repatha is approved. The pt stated that they already knew and that but that the pharmacy is denying his bystolic. Patient stated that he would like another drug prescribed in its place. I will route this message to Trixie Dredge, Dr. Allison Quarry nurse.

## 2019-08-24 ENCOUNTER — Telehealth: Payer: Self-pay | Admitting: *Deleted

## 2019-08-24 MED ORDER — BYSTOLIC 20 MG PO TABS: 20.0000 mg | ORAL_TABLET | Freq: Every day | ORAL | 2 refills | Status: DC

## 2019-08-24 NOTE — Telephone Encounter (Signed)
Approved today: Request Reference Number: [id number]. BYSTOLIC TAB 20MG  is approved through 08/23/2020. Your patient may now fill this prescription and it will be covered.

## 2019-08-24 NOTE — Telephone Encounter (Signed)
The patient has been made aware and a refill sent in.

## 2019-08-24 NOTE — Addendum Note (Signed)
Addended by: Ricci Barker on: 08/24/2019 11:57 AM   Modules accepted: Orders

## 2019-08-24 NOTE — Telephone Encounter (Signed)
Prior auth for Bystolic 20 mg once daily has been sent through CoverMyMeds: [id number]

## 2019-12-13 ENCOUNTER — Other Ambulatory Visit: Payer: Self-pay | Admitting: *Deleted

## 2019-12-13 MED ORDER — BYSTOLIC 20 MG PO TABS: 10.0000 mg | ORAL_TABLET | Freq: Every day | ORAL | 2 refills | Status: DC

## 2019-12-22 ENCOUNTER — Telehealth: Payer: Self-pay | Admitting: *Deleted

## 2019-12-22 MED ORDER — NEBIVOLOL HCL 10 MG PO TABS: 10.0000 mg | ORAL_TABLET | Freq: Every day | ORAL | 0 refills | Status: DC

## 2019-12-22 NOTE — Telephone Encounter (Signed)
A new script for Bystolic 10 mg once daily has been sent in. See MyChart message.  Good Afternoon Dr C. After cutting my Bystolic in half, my pulse averages 68bpm and I don't feel wiped out during the day. My BP is averaging 124/74 so I think this is working. Can we change my dose to the 10's so I don't have to cut the 20 in half? Thanks and have a great day.

## 2020-01-30 ENCOUNTER — Other Ambulatory Visit: Payer: Self-pay

## 2020-02-01 ENCOUNTER — Other Ambulatory Visit: Payer: Self-pay

## 2020-02-01 MED ORDER — OLMESARTAN MEDOXOMIL 40 MG PO TABS: 40.0000 mg | ORAL_TABLET | Freq: Every day | ORAL | 3 refills | Status: DC

## 2020-02-10 DIAGNOSIS — M25562 Pain in left knee: Secondary | ICD-10-CM | POA: Diagnosis not present

## 2020-03-26 ENCOUNTER — Other Ambulatory Visit: Payer: Self-pay

## 2020-03-26 DIAGNOSIS — Z79899 Other long term (current) drug therapy: Secondary | ICD-10-CM

## 2020-03-26 MED ORDER — FUROSEMIDE 40 MG PO TABS: ORAL_TABLET | ORAL | 1 refills | Status: DC

## 2020-03-26 NOTE — Progress Notes (Signed)
Called patient to verify the dosage amount and which pharmacy to send it to. Patient verified that he take 2 of the 40 mg tablets in the morning and 1 tablet in the evenings and that it is the [facility] in [place]. Patient verbalized understanding and thanked me for calling.

## 2020-04-13 DIAGNOSIS — L239 Allergic contact dermatitis, unspecified cause: Secondary | ICD-10-CM | POA: Diagnosis not present

## 2020-05-02 ENCOUNTER — Other Ambulatory Visit: Payer: Self-pay

## 2020-05-02 MED ORDER — NEBIVOLOL HCL 10 MG PO TABS: 10.0000 mg | ORAL_TABLET | Freq: Every day | ORAL | 0 refills | Status: DC

## 2020-05-08 ENCOUNTER — Other Ambulatory Visit: Payer: Self-pay | Admitting: Surgery

## 2020-05-08 DIAGNOSIS — M6208 Separation of muscle (nontraumatic), other site: Secondary | ICD-10-CM | POA: Diagnosis not present

## 2020-07-16 ENCOUNTER — Encounter: Payer: Self-pay | Admitting: Cardiology

## 2020-07-16 ENCOUNTER — Other Ambulatory Visit: Payer: Self-pay

## 2020-07-16 ENCOUNTER — Ambulatory Visit (INDEPENDENT_AMBULATORY_CARE_PROVIDER_SITE_OTHER): Payer: No Typology Code available for payment source | Admitting: Cardiology

## 2020-07-16 VITALS — BP 118/78 | HR 73 | Ht 73.0 in | Wt 286.2 lb

## 2020-07-16 DIAGNOSIS — E785 Hyperlipidemia, unspecified: Secondary | ICD-10-CM

## 2020-07-16 DIAGNOSIS — Z8673 Personal history of transient ischemic attack (TIA), and cerebral infarction without residual deficits: Secondary | ICD-10-CM

## 2020-07-16 DIAGNOSIS — R972 Elevated prostate specific antigen [PSA]: Secondary | ICD-10-CM | POA: Insufficient documentation

## 2020-07-16 DIAGNOSIS — Z9861 Coronary angioplasty status: Secondary | ICD-10-CM

## 2020-07-16 DIAGNOSIS — I10 Essential (primary) hypertension: Secondary | ICD-10-CM

## 2020-07-16 DIAGNOSIS — I2510 Atherosclerotic heart disease of native coronary artery without angina pectoris: Secondary | ICD-10-CM | POA: Diagnosis not present

## 2020-07-16 NOTE — Patient Instructions (Signed)
Medication Instructions:  Continue current medications  *If you need a refill on your cardiac medications before your next appointment, please call your pharmacy*   Lab Work: None Ordered   Testing/Procedures: None Ordered   Follow-Up: At [facility], you and your health needs are our priority.  As part of our continuing mission to provide you with exceptional heart care, we have created designated Provider Care Teams.  These Care Teams include your primary Cardiologist (physician) and Advanced Practice Providers (APPs -  Physician Assistants and Nurse Practitioners) who all work together to provide you with the care you need, when you need it.  We recommend signing up for the patient portal called "MyChart".  Sign up information is provided on this After Visit Summary.  MyChart is used to connect with patients for Virtual Visits (Telemedicine).  Patients are able to view lab/test results, encounter notes, upcoming appointments, etc.  Non-urgent messages can be sent to your provider as well.   To learn more about what you can do with MyChart, go to [email or web address].    Your next appointment:   6 month(s)  The format for your next appointment:   In Person  Provider:   You may see [name], MD or one of the following Advanced Practice Providers on your designated Care Team:    Almyra Deforest, PA-C  Fabian Sharp, PA-C or   Roby Lofts, [place]

## 2020-07-16 NOTE — Assessment & Plan Note (Signed)
H/O of CVA, post cardiac cath, minimal speech deficit  Nov 2011

## 2020-07-16 NOTE — Assessment & Plan Note (Signed)
CAD, RCA X 3 ION DES 07/01/2010, ISR -06/23/12 and Jan 2016- Cath dec 2016 and Dec 2017 showed patent stents Myoview Nov 2018 was low risk. He remains asymptomatic as far as angina goes

## 2020-07-16 NOTE — Assessment & Plan Note (Signed)
LDL 84 on Repatha once a month- he will increase to twice a month

## 2020-07-16 NOTE — Assessment & Plan Note (Signed)
Diet controlled.  

## 2020-07-16 NOTE — Assessment & Plan Note (Signed)
Controlled.  

## 2020-07-16 NOTE — [facility] Notes (Signed)
Cardiology Office Note:    Date:  07/16/2020   ID:  Dylan [name], DOB [birth year], MRN [id number]  PCP:  [name], PA-C  Cardiologist:  [name], MD  Electrophysiologist:  None   Referring MD: [name], PA-C   No chief complaint on file.   History of Present Illness:    Dylan [name] is a 54 y.o. male with a hx of a NSTEMI treated with RCA drug-eluting stent in 2011.  This was complicated by a small post PCI CVA.  He had in-stent restenosis in November 2013.  In January 2016 he had in-stent restenosis.  Relook catheterization in December 2016 and December 2017 showed no in-stent restenosis. He has a full metal jacket in the RCA, no significant Lt system disease.   Myoview study in November 2018 was low risk.  The patient is in the office today for his routine follow-up. In the past he had problems with palpitations that have been documented to be PACs.  He now has a Kardia device and can tell when he has palpitations that its only PACs.  He says he has not had any exertional chest pain or symptoms to suggested RCA ISR.   He recently saw his urologist.  In the past it was thought he had prostatitis.  Now his PSA is 22 and is urologist is concerned.  He wants to do further evaluation including an MRI and possibly biopsy.  The patient is appropriately anxious about this.  Past Medical History:  Diagnosis Date  . CAD S/P percutaneous coronary angioplasty 07/2010; 07/2012; 08/2014   1) 2/'11: MI - 100% RCA - PCI (3 Taxus ION DES 3.0 x 28, 3.0 x 24 & 3.5 x 12 distal-prox); b) ISR of RCA stent - -AnngioSculpt PTCA; c) NSTEMI 1/'16: mild ISR with Thrombosis of RCA Stents (in setting of Mesenteric Vein Thrombosis) - Cutting Balloon PTCA; c. Re-look Cath 07/2015 & 07/2016: ~15% ISR in RCA,20% p-mLAD & o-pCx.  . Coronary stent thrombosis 08/15/2014  . DM type 2 (diabetes mellitus, type 2) ([place]) 06/07/2017  . Essential hypertension 07/07/2012  . Finding of multiple  premature atrial contractions by electrocardiography    Symptomatic palpitations ; worse with poor sleep and fatigue along with dehydration.   . Hyperlipidemia with target LDL less than 70 09/01/2014  . Mesenteric vein thrombosis ([place])   . Migraines    "maybe one/yr" (07/06/2012)  . NSTEMI (non-ST elevated myocardial infarction) ([place]) 07/2010, 08/2014   And unstable angina in February 20 13th; Echo 1/12/'16: Moderate concentric LVH. EF 60-65% with no regional WMA. Gr 1 DD, otherwise normal  . Stroke ([place]) 2011; 2013   S/P cardiac cath - embolic stroke; denies residual (07/06/2012)  . 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 04/19/2013   No evidence of CVA on MRI/MRA & Dliated Eye Examination. ruptured blood vessel & not occlusion.    Past Surgical History:  Procedure Laterality Date  . BOWEL RESECTION N/A 08/28/2014   Procedure: SMALL BOWEL RESECTION;  Surgeon: [name], MD;  Location: [facility];  Service: General;  Laterality: N/A;  . CARDIAC CATHETERIZATION  04/18/2013  . CARDIAC CATHETERIZATION N/A 07/20/2015   Procedure: Left Heart Cath and Coronary Angiography;  Surgeon: [name], MD;  Location: [facility];  Service: Cardiovascular;  15% ISR in the RCA stent segment, 20% proximal-mid LAD as well as ostial and  proximal circumflex. Normal LV function.  Marland Kitchen CARDIAC CATHETERIZATION N/A 07/23/2016   Procedure: Left Heart Cath and Coronary Angiography;  Surgeon: [name], MD;  Location: [facility];  Service: Cardiovascular: E 55-65%. ~15% diffuse RCA ISR, ~20% diffuse pLAD & pCx  . CORONARY ANGIOPLASTY  07/06/2012; 08/15/2014   a) 12/'13: PTCA of 80% ISR mRCA - AngioSculpt; b) PTCA of  ISR/thrombosis  . CORONARY ANGIOPLASTY WITH STENT PLACEMENT  07/2010   inferior wall MI - 3 Taxus Ion DES (3.0x1mm, 3.0x52mm, 3.5x36mm) to prox and mid RCA  . INCISIONAL HERNIA REPAIR N/A  10/15/2017   Procedure: HERNIA REPAIR INCISIONAL;  Surgeon: [name], MD;  Location: [facility];  Service: General;  Laterality: N/A;  . INSERTION OF MESH N/A 10/15/2017   Procedure: INSERTION OF MESH;  Surgeon: [name], MD;  Location: [facility];  Service: General;  Laterality: N/A;  . LAPAROSCOPIC APPENDECTOMY N/A 08/28/2014   Procedure: APPENDECTOMY LAPAROSCOPIC;  Surgeon: [name], MD;  Location: [facility];  Service: General;  Laterality: N/A;  . LAPAROSCOPY  08/28/2014   Procedure: LAPAROSCOPY DIAGNOSTIC;  Surgeon: [name], MD;  Location: [facility];  Service: General;;  . LEFT HEART CATHETERIZATION WITH CORONARY ANGIOGRAM N/A 06/11/2012   Procedure: LEFT HEART CATHETERIZATION WITH CORONARY ANGIOGRAM;  Surgeon: [name], MD;  Location: [facility];  Service: Cardiovascular;  Laterality: N/A;  . LEFT HEART CATHETERIZATION WITH CORONARY ANGIOGRAM N/A 04/18/2013   Procedure: LEFT HEART CATHETERIZATION WITH CORONARY ANGIOGRAM;  Surgeon: [name], MD;  Location: [facility];  Service: Cardiovascular;  Laterality: N/A;  . LEFT HEART CATHETERIZATION WITH CORONARY ANGIOGRAM N/A 08/15/2014   Procedure: LEFT HEART CATHETERIZATION WITH CORONARY ANGIOGRAM;  Surgeon: [name], MD;  Location: [facility] CATH LAB;  Service: Cardiovascular;  Laterality: N/A;  . PERCUTANEOUS CORONARY STENT INTERVENTION (PCI-S) N/A 07/06/2012   Procedure: PERCUTANEOUS CORONARY STENT INTERVENTION (PCI-S);  Surgeon: [name], MD;  Location: [facility] CATH LAB;  Service: Cardiovascular;  Laterality: N/A;  . SEPTOPLASTY  2001  . TONSILLECTOMY    . TRANSTHORACIC ECHOCARDIOGRAM  08/15/2014   Moderate concentric LVH. EF 60-65% with no regional WMA. Gr 1 DD, otherwise normal    Current Medications: Current Meds  Medication Sig  . aspirin EC 81 MG tablet Take 1 tablet (81 mg total) by mouth daily.  . Empagliflozin-metFORMIN HCl ER (SYNJARDY XR) 25-1000 MG TB24 Take 1 tablet by mouth daily.  . Evolocumab  (REPATHA SURECLICK) 510 MG/ML SOAJ Inject 140 mg into the skin every 14 (fourteen) days.  . furosemide (LASIX) 40 MG tablet TAKE 2 TABLETS BY MOUTH IN THE MORNINGS AND 1 TABLET IN THE EVENINGS  . ibuprofen (ADVIL,MOTRIN) 200 MG tablet Take 600 mg by mouth every 6 (six) hours as needed for headache or moderate pain.   . metoprolol tartrate (LOPRESSOR) 25 MG tablet TAKE DAILY AS NEEDED FOR PALPITATIONS  . nebivolol (BYSTOLIC) 10 MG tablet Take 1 tablet (10 mg total) by mouth daily.  . nitroGLYCERIN (NITROSTAT) 0.4 MG SL tablet Place 1 tablet (0.4 mg total) under the tongue every 5 (five) minutes x 3 doses as needed for chest pain.  Marland Kitchen olmesartan (BENICAR) 40 MG tablet Take 1 tablet (40 mg total) by mouth daily.  Vladimir Faster Glycol-Propyl Glycol (SYSTANE OP) Place 1 drop into both eyes daily as needed (dry eyes).  . Potassium 99 MG TABS Take 1 tablet by mouth daily.     Allergies:   Crestor [rosuvastatin], Zofran [ondansetron hcl], Ace inhibitors, Lipitor [  atorvastatin], and Codeine   Social History   Socioeconomic History  . Marital status: Married    Spouse name: Not on file  . Number of children: 2  . Years of education: 57  . Highest education level: Not on file  Occupational History  . Occupation: Pharmacologist: [employer]  Tobacco Use  . Smoking status: Former Smoker    Packs/day: 1.00    Years: 15.00    Pack years: 15.00    Types: Cigarettes    Quit date: 06/30/2010    Years since quitting: 10.0  . Smokeless tobacco: Current User    Types: Snuff  Vaping Use  . Vaping Use: Never used  Substance and Sexual Activity  . Alcohol use: Yes    Alcohol/week: 7.0 standard drinks    Types: 7 Shots of liquor per week    Comment: a  glass of scotch per day  . Drug use: No  . Sexual activity: Yes  Other Topics Concern  . Not on file  Social History Narrative  . Not on file   Social Determinants of Health   Financial Resource Strain: Not on file  Food Insecurity: Not  on file  Transportation Needs: Not on file  Physical Activity: Not on file  Stress: Not on file  Social Connections: Not on file     Family History: The patient's family history includes Atrial fibrillation in his mother; Coronary artery disease in his father; Heart attack in his father, paternal grandfather, and paternal uncle; Mitral valve prolapse in his mother.  ROS:   Please see the history of present illness.     All other systems reviewed and are negative.  EKGs/Labs/Other Studies Reviewed:    The following studies were reviewed today  Myoview Nov 2018-  There was no ST segment deviation noted during stress.  The study is normal.  This is a low risk study.  The left ventricular ejection fraction is normal (55-65%).  Nuclear stress EF calculated as 55% although visually appears mildly reduced. Consider 2D echo to correlate.     EKG:  EKG is ordered today.  The ekg ordered today demonstrates NSR, HR 73  Recent Labs: No results found for requested labs within last 8760 hours.  Recent Lipid Panel    Component Value Date/Time   CHOL 123 08/25/2017 0805   TRIG 363 (H) 08/25/2017 0805   HDL 34 (L) 08/25/2017 0805   CHOLHDL 3.6 08/25/2017 0805   CHOLHDL 2.6 06/08/2017 0315   VLDL 62 (H) 06/08/2017 0315   LDLCALC 16 08/25/2017 0805    Physical Exam:    VS:  BP 118/78   Pulse 73   Ht 6\' 1"  (1.854 m)   Wt 286 lb 3.2 oz (129.8 kg)   BMI 37.76 kg/m     Wt Readings from Last 3 Encounters:  07/16/20 286 lb 3.2 oz (129.8 kg)  05/25/19 253 lb (114.8 kg)  01/13/19 275 lb (124.7 kg)     GEN: Overweight caucasian male,  well developed in no acute distress HEENT: Normal NECK: No JVD; No carotid bruits CARDIAC: RRR, no murmurs, rubs, gallops RESPIRATORY:  Clear to auscultation without rales, wheezing or rhonchi  ABDOMEN: Soft, non-tender, non-distended MUSCULOSKELETAL:  No edema; No deformity  SKIN: Warm and dry NEUROLOGIC:  Alert and oriented x 3 PSYCHIATRIC:   Normal affect   ASSESSMENT:    CAD S/P percutaneous coronary angioplasty CAD, RCA X 3 ION DES 07/01/2010, ISR -06/23/12 and Jan 2016- Cath  dec 2016 and Dec 2017 showed patent stents Myoview Nov 2018 was low risk. He remains asymptomatic as far as angina goes  H/O: CVA (cerebrovascular accident) H/O of CVA, post cardiac cath, minimal speech deficit  Nov 2011  Essential hypertension Controlled  Non-insulin dependent type 2 diabetes mellitus (HCC) Diet controlled  Dyslipidemia, statin intol LDL 84 on Repatha once a month- he will increase to twice a month  Elevated PSA Being worked up at [facility].  Recent PSA was 22.  He is to have an MRI and possibly a biopsy. From a cardiac standpoint he is stable to proceed, OK to hold aspirin if needed but we would prefer he remain of this if possible.   PLAN:    The patient has been in touch with our pharmacist and plans to increase his Repatha to twice a month.  F/U Dr Sallyanne Kuster 6 months.    Medication Adjustments/Labs and Tests Ordered: Current medicines are reviewed at length with the patient today.  Concerns regarding medicines are outlined above.  Orders Placed This Encounter  Procedures  . EKG 12-Lead   No orders of the defined types were placed in this encounter.   Patient Instructions  Medication Instructions:  Continue current medications  *If you need a refill on your cardiac medications before your next appointment, please call your pharmacy*   Lab Work: None Ordered   Testing/Procedures: None Ordered   Follow-Up: At [facility], you and your health needs are our priority.  As part of our continuing mission to provide you with exceptional heart care, we have created designated Provider Care Teams.  These Care Teams include your primary Cardiologist (physician) and Advanced Practice Providers (APPs -  Physician Assistants and Nurse Practitioners) who all work together to provide you with the care you  need, when you need it.  We recommend signing up for the patient portal called "MyChart".  Sign up information is provided on this After Visit Summary.  MyChart is used to connect with patients for Virtual Visits (Telemedicine).  Patients are able to view lab/test results, encounter notes, upcoming appointments, etc.  Non-urgent messages can be sent to your provider as well.   To learn more about what you can do with MyChart, go to [email or web address].    Your next appointment:   6 month(s)  The format for your next appointment:   In Person  Provider:   You may see [name], MD or one of the following Advanced Practice Providers on your designated Care Team:    Almyra Deforest, PA-C  Fabian Sharp, [place] or   Roby Lofts, PA-C        Signed, [name], [place]  07/16/2020 4:03 PM    [place]

## 2020-07-16 NOTE — Assessment & Plan Note (Signed)
Being worked up at [facility].  Recent PSA was 22.  He is to have an MRI and possibly a biopsy. From a cardiac standpoint he is stable to proceed, OK to hold aspirin if needed but we would prefer he remain of this if possible.

## 2020-07-20 ENCOUNTER — Telehealth: Payer: Self-pay | Admitting: Cardiovascular Disease

## 2020-07-20 NOTE — Telephone Encounter (Signed)
Dylan [name] is calling to see if the prior authorization that was sent on 07/18/20 for Repatha has been received. The reference number to use when calling back is [id number]. Please advise.

## 2020-07-23 NOTE — Telephone Encounter (Signed)
PA SUBMITTED 07/23/20

## 2020-07-24 ENCOUNTER — Other Ambulatory Visit: Payer: Self-pay | Admitting: Urology

## 2020-07-24 DIAGNOSIS — R972 Elevated prostate specific antigen [PSA]: Secondary | ICD-10-CM

## 2020-08-05 ENCOUNTER — Other Ambulatory Visit: Payer: Self-pay | Admitting: Cardiovascular Disease

## 2020-08-08 NOTE — Telephone Encounter (Signed)
Last year we were able to get him a preauthorization for bystolic approved. We can try again. May also have copay cards in office. If that fails, please Rx carvedilol 12.5 mg BID  -maybe he will tolerate better now.

## 2020-08-15 ENCOUNTER — Telehealth: Payer: Self-pay | Admitting: *Deleted

## 2020-08-15 ENCOUNTER — Other Ambulatory Visit: Payer: Self-pay | Admitting: Pharmacist

## 2020-08-15 DIAGNOSIS — I2510 Atherosclerotic heart disease of native coronary artery without angina pectoris: Secondary | ICD-10-CM

## 2020-08-15 DIAGNOSIS — E785 Hyperlipidemia, unspecified: Secondary | ICD-10-CM

## 2020-08-15 DIAGNOSIS — Z9861 Coronary angioplasty status: Secondary | ICD-10-CM

## 2020-08-15 MED ORDER — REPATHA SURECLICK 140 MG/ML ~~LOC~~ SOAJ: 140.0000 mg | SUBCUTANEOUS | 3 refills | Status: DC

## 2020-08-15 NOTE — Telephone Encounter (Signed)
Prior Auth sent through [facility] for Bystolic: Key: [id number])

## 2020-08-17 MED ORDER — CARVEDILOL 12.5 MG PO TABS: 12.5000 mg | ORAL_TABLET | Freq: Two times a day (BID) | ORAL | 3 refills | Status: DC

## 2020-08-17 NOTE — Telephone Encounter (Signed)
Bystolic has been denied due to the patient's new insurance not covering the medication.The patient will be starting Carvedilol 12.5 mg twice daily. See MyChart message for details.

## 2020-08-21 ENCOUNTER — Other Ambulatory Visit: Payer: No Typology Code available for payment source

## 2020-08-29 ENCOUNTER — Other Ambulatory Visit: Payer: Self-pay

## 2020-08-29 ENCOUNTER — Ambulatory Visit
Admission: RE | Admit: 2020-08-29 | Discharge: 2020-08-29 | Disposition: A | Discharge status: Home / Self Care | Payer: No Typology Code available for payment source | Source: Ambulatory Visit | Attending: Urology | Admitting: Urology

## 2020-08-29 DIAGNOSIS — R972 Elevated prostate specific antigen [PSA]: Secondary | ICD-10-CM

## 2020-08-29 MED ORDER — GADOBENATE DIMEGLUMINE 529 MG/ML IV SOLN
20.0000 mL | Freq: Once | INTRAVENOUS | Status: AC | PRN
  Administered 2020-08-29: 20 mL via INTRAVENOUS

## 2020-09-13 ENCOUNTER — Other Ambulatory Visit: Payer: Self-pay | Admitting: Urology

## 2020-09-13 DIAGNOSIS — C61 Malignant neoplasm of prostate: Secondary | ICD-10-CM

## 2020-09-22 ENCOUNTER — Other Ambulatory Visit: Payer: Self-pay | Admitting: Cardiovascular Disease

## 2020-09-28 ENCOUNTER — Encounter (HOSPITAL_COMMUNITY)
Admission: RE | Admit: 2020-09-28 | Discharge: 2020-09-28 | Disposition: A | Discharge status: Home / Self Care | Payer: No Typology Code available for payment source | Source: Ambulatory Visit | Attending: Urology | Admitting: Urology

## 2020-09-28 ENCOUNTER — Other Ambulatory Visit: Payer: Self-pay

## 2020-09-28 DIAGNOSIS — C61 Malignant neoplasm of prostate: Secondary | ICD-10-CM | POA: Diagnosis not present

## 2020-09-28 MED ORDER — TECHNETIUM TC 99M MEDRONATE IV KIT
19.9000 | PACK | Freq: Once | INTRAVENOUS | Status: AC | PRN
  Administered 2020-09-28: 19.9 via INTRAVENOUS

## 2020-10-05 ENCOUNTER — Encounter: Payer: Self-pay | Admitting: Medical Oncology

## 2020-10-05 NOTE — [facility] Notes (Signed)
I called pt to introduce myself as the Prostate Nurse Navigator and the Coordinator of the Prostate [place].  1. I confirmed with the patient he is aware of his referral to the clinic 3/11, arriving at 8 am.  2. I discussed the format of the clinic and the physicians he will be seeing that day.  3. I discussed where the clinic is located and how to contact me.  4. I confirmed his address and informed him I would be mailing a packet of information and forms to be completed. I asked him to bring them with him the day of his appointment.   He voiced understanding of the above. I asked him to call me if he has any questions or concerns regarding his appointments or the forms he needs to complete.

## 2020-10-10 NOTE — [facility] Notes (Signed)
GU Location of Tumor / Histology: prostatic adenocarcinoma  If Prostate Cancer, Gleason Score is (4 + 3) and PSA is (25). Prostate volume: 30.66g.  Per Dr. Louis Meckel, the patient was seen previously for an elevated PSA, but the patient refused prostate biopsy at that time. In 2020, he was noted to have an elevated PSA of 17.7 by his primary care physician  Biopsies of prostate (if applicable) revealed:    Past/Anticipated interventions by urology, if any: prostate biopsy, referral to [facility]  Past/Anticipated interventions by medical oncology, if any: no  Weight changes, if any: denies  Bowel/Bladder complaints, if any: IPSS 3. SHIM 25. Denies dysuria, hematuria, urinary leakage or incontinence. Denies any bowel complaints.    Nausea/Vomiting, if any: denies  Pain issues, if any:  denies  SAFETY ISSUES:  Prior radiation? denies  Pacemaker/ICD? denies  Possible current pregnancy? no, male patient  Is the patient on methotrexate? no  Current Complaints / other details:  55 year old male. Married with 4 children. Stopped smoking in 2011. Works as a Nurse, learning disability.

## 2020-10-11 ENCOUNTER — Encounter: Payer: Self-pay | Admitting: Medical Oncology

## 2020-10-11 NOTE — [facility] Notes (Signed)
Spoke with patient to confirm appointment for [facility] 3/11, arriving @ 8 am. I reviewed [place] parking, registration and reminded him to bring his completed medical forms.

## 2020-10-12 ENCOUNTER — Other Ambulatory Visit: Payer: Self-pay

## 2020-10-12 ENCOUNTER — Inpatient Hospital Stay: Payer: No Typology Code available for payment source | Attending: Oncology | Admitting: Oncology

## 2020-10-12 ENCOUNTER — Encounter: Payer: Self-pay | Admitting: Radiation Oncology

## 2020-10-12 ENCOUNTER — Encounter: Payer: Self-pay | Admitting: Medical Oncology

## 2020-10-12 ENCOUNTER — Ambulatory Visit
Admission: RE | Admit: 2020-10-12 | Discharge: 2020-10-12 | Disposition: A | Discharge status: Home / Self Care | Payer: No Typology Code available for payment source | Source: Ambulatory Visit | Attending: Radiation Oncology | Admitting: Radiation Oncology

## 2020-10-12 ENCOUNTER — Encounter: Payer: Self-pay | Admitting: General Practice

## 2020-10-12 VITALS — BP 128/89 | HR 75 | Temp 96.9°F | Resp 18 | Ht 73.0 in | Wt 285.8 lb

## 2020-10-12 DIAGNOSIS — Z87891 Personal history of nicotine dependence: Secondary | ICD-10-CM | POA: Diagnosis not present

## 2020-10-12 DIAGNOSIS — I81 Portal vein thrombosis: Secondary | ICD-10-CM | POA: Diagnosis not present

## 2020-10-12 DIAGNOSIS — C61 Malignant neoplasm of prostate: Secondary | ICD-10-CM | POA: Insufficient documentation

## 2020-10-12 DIAGNOSIS — I10 Essential (primary) hypertension: Secondary | ICD-10-CM | POA: Insufficient documentation

## 2020-10-12 DIAGNOSIS — Z79899 Other long term (current) drug therapy: Secondary | ICD-10-CM | POA: Diagnosis not present

## 2020-10-12 DIAGNOSIS — E119 Type 2 diabetes mellitus without complications: Secondary | ICD-10-CM | POA: Insufficient documentation

## 2020-10-12 DIAGNOSIS — E785 Hyperlipidemia, unspecified: Secondary | ICD-10-CM | POA: Insufficient documentation

## 2020-10-12 DIAGNOSIS — I2510 Atherosclerotic heart disease of native coronary artery without angina pectoris: Secondary | ICD-10-CM | POA: Insufficient documentation

## 2020-10-12 DIAGNOSIS — I252 Old myocardial infarction: Secondary | ICD-10-CM | POA: Insufficient documentation

## 2020-10-12 DIAGNOSIS — Z8673 Personal history of transient ischemic attack (TIA), and cerebral infarction without residual deficits: Secondary | ICD-10-CM | POA: Insufficient documentation

## 2020-10-12 HISTORY — DX: Malignant neoplasm of prostate: C61

## 2020-10-12 NOTE — Progress Notes (Signed)
Radiation Oncology         [phone] ________________________________  Multidisciplinary Prostate Cancer Clinic  Initial Radiation Oncology Consultation  Name: Dylan [name] MRN: [id number]  Date: 10/12/2020  DOB: 1965/09/08  EN:IDPOE, [name], PA-C  Ardis Hughs, MD   REFERRING PHYSICIAN: Ardis Hughs, MD  DIAGNOSIS: 55 y.o. gentleman with stage T1c adenocarcinoma of the prostate with a Gleason's score of 4+3 and a PSA of 25    ICD-10-CM   1. Malignant neoplasm of prostate ([place])  [place] ILLNESS::Dylan [name] is a 55 y.o. gentleman.  He has a history of hypogonadism and was previously treated with testosterone replacement therapy but this was discontinued at least 5 years ago.  On 05/17/2019, he was noted to have an elevated PSA of 17.7 by his primary care provider, [name], PA-C.  Accordingly, he was referred for evaluation in urology by Dr. Louis Meckel on 05/24/2019,  digital rectal examination was performed at that time revealing bilateral gland tenderness with induration.  At that time, he was treated for prostatitis and advised to follow-up with a repeat PSA in approximately 6 weeks. However, he was lost to follow up until 07/16/2020 when he was referred back to Dr. Louis Meckel for further increase in the PSA, up to 22. DRE performed that day was benign but a repeat PSA also performed that day showed further elevation to 25. He underwent prostate MRI on 08/29/2020 showing findings highly suspicious for diffuse acute prostatitis (PI-RADS 3).  The prostate volume was estimated to be 24 cc. The patient proceeded to transrectal ultrasound with 12 biopsies of the prostate on 09/04/2020.  The prostate volume measured 30.66 cc by ultrasound.  Out of 12 core biopsies, 10 were positive.  The maximum Gleason score was 4+3, and this was seen in the left base, left base lateral (with PNI), left mid, left mid lateral and right base. Additionally, Gleason 3+4  was seen in the left apex lateral (with PNI), left apex, right mid, right mid lateral and right apex.    He underwent staging CT A/P on 09/21/2020 and bone scan on 09/28/2020, both showing no evidence of visceral or osseous metastatic disease.  The patient reviewed the biopsy results with his urologist and he has kindly been referred today to the multidisciplinary prostate cancer clinic for presentation of pathology and radiology studies in our conference for discussion of potential radiation treatment options and clinical evaluation.  PREVIOUS RADIATION THERAPY: No  PAST MEDICAL HISTORY:  has a past medical history of CAD S/P percutaneous coronary angioplasty (07/2010; 07/2012; 08/2014), Coronary stent thrombosis (08/15/2014), DM type 2 (diabetes mellitus, type 2) ([place]) (06/07/2017), Essential hypertension (07/07/2012), Finding of multiple premature atrial contractions by electrocardiography, Hyperlipidemia with target LDL less than 70 (09/01/2014), Mesenteric vein thrombosis ([place]), Migraines, NSTEMI (non-ST elevated myocardial infarction) ([place]) (07/2010, 08/2014), Prostate cancer [facility]), Stroke ([place]) (2011; 2013), and Visual loss, 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 (04/19/2013).    PAST SURGICAL HISTORY: Past Surgical History:  Procedure Laterality Date  . BOWEL RESECTION N/A 08/28/2014   Procedure: SMALL BOWEL RESECTION;  Surgeon: [name], MD;  Location: [place];  Service: General;  Laterality: N/A;  . CARDIAC CATHETERIZATION  04/18/2013  . CARDIAC CATHETERIZATION N/A 07/20/2015   Procedure: Left Heart Cath and Coronary Angiography;  Surgeon: [name], MD;  Location: [place] CV [facility];  Service:  Cardiovascular;  15% ISR in the RCA stent segment, 20% proximal-mid LAD as well as ostial and proximal circumflex. Normal LV function.  Marland Kitchen CARDIAC CATHETERIZATION N/A 07/23/2016   Procedure: Left Heart Cath  and Coronary Angiography;  Surgeon: [name], MD;  Location: [place] CV LAB;  Service: Cardiovascular: E 55-65%. ~15% diffuse RCA ISR, ~20% diffuse pLAD & pCx  . CORONARY ANGIOPLASTY  07/06/2012; 08/15/2014   a) 12/'13: PTCA of 80% ISR mRCA - AngioSculpt; b) PTCA of  ISR/thrombosis  . CORONARY ANGIOPLASTY WITH STENT PLACEMENT  07/2010   inferior wall MI - 3 Taxus Ion DES (3.0x57mm, 3.0x63mm, 3.5x40mm) to prox and mid RCA  . INCISIONAL HERNIA REPAIR N/A 10/15/2017   Procedure: HERNIA REPAIR INCISIONAL;  Surgeon: [name], MD;  Location: [facility];  Service: General;  Laterality: N/A;  . INSERTION OF MESH N/A 10/15/2017   Procedure: INSERTION OF MESH;  Surgeon: [name], MD;  Location: [facility];  Service: General;  Laterality: N/A;  . LAPAROSCOPIC APPENDECTOMY N/A 08/28/2014   Procedure: APPENDECTOMY LAPAROSCOPIC;  Surgeon: [name], MD;  Location: [place];  Service: General;  Laterality: N/A;  . LAPAROSCOPY  08/28/2014   Procedure: LAPAROSCOPY DIAGNOSTIC;  Surgeon: [name], MD;  Location: [place];  Service: General;;  . LEFT HEART CATHETERIZATION WITH CORONARY ANGIOGRAM N/A 06/11/2012   Procedure: LEFT HEART CATHETERIZATION WITH CORONARY ANGIOGRAM;  Surgeon: [name], MD;  Location: [place] CATH LAB;  Service: Cardiovascular;  Laterality: N/A;  . LEFT HEART CATHETERIZATION WITH CORONARY ANGIOGRAM N/A 04/18/2013   Procedure: LEFT HEART CATHETERIZATION WITH CORONARY ANGIOGRAM;  Surgeon: [name], MD;  Location: [facility] CATH LAB;  Service: Cardiovascular;  Laterality: N/A;  . LEFT HEART CATHETERIZATION WITH CORONARY ANGIOGRAM N/A 08/15/2014   Procedure: LEFT HEART CATHETERIZATION WITH CORONARY ANGIOGRAM;  Surgeon: [name], MD;  Location: [facility] CATH LAB;  Service: Cardiovascular;  Laterality: N/A;  . PERCUTANEOUS CORONARY STENT INTERVENTION (PCI-S) N/A 07/06/2012   Procedure: PERCUTANEOUS CORONARY STENT INTERVENTION (PCI-S);  Surgeon: [name], MD;  Location:  [facility] CATH LAB;  Service: Cardiovascular;  Laterality: N/A;  . SEPTOPLASTY  2001  . TONSILLECTOMY    . TRANSTHORACIC ECHOCARDIOGRAM  08/15/2014   Moderate concentric LVH. EF 60-65% with no regional WMA. Gr 1 DD, otherwise normal    FAMILY HISTORY: family history includes Atrial fibrillation in his mother; Coronary artery disease in his father; Heart attack in his father, paternal grandfather, and paternal uncle; Mitral valve prolapse in his mother.  SOCIAL HISTORY:  reports that he quit smoking about 10 years ago. His smoking use included cigarettes. He has a 15.00 pack-year smoking history. His smokeless tobacco use includes snuff. He reports current alcohol use of about 7.0 standard drinks of alcohol per week. He reports that he does not use drugs.  ALLERGIES: Crestor [rosuvastatin], Zofran [ondansetron hcl], Ace inhibitors, Lipitor [atorvastatin], and Codeine  MEDICATIONS:  Current Outpatient Medications  Medication Sig Dispense Refill  . carvedilol (COREG) 12.5 MG tablet Take 1 tablet (12.5 mg total) by mouth 2 (two) times daily. 180 tablet 3  . Empagliflozin-metFORMIN HCl ER (SYNJARDY XR) 25-1000 MG TB24 Take 1 tablet by mouth daily.    . furosemide (LASIX) 40 MG tablet TAKE 2 TABLETS BY MOUTH EVERY MORNING AND 1 TABLET IN THE EVENING 270 tablet 3  . olmesartan (BENICAR) 40 MG tablet Take 1 tablet (40 mg total) by mouth daily. 90 tablet 3  . Potassium 99 MG TABS Take 1 tablet by mouth daily.    Marland Kitchen  nitroGLYCERIN (NITROSTAT) 0.4 MG SL tablet Place 1 tablet (0.4 mg total) under the tongue every 5 (five) minutes x 3 doses as needed for chest pain. (Patient not taking: Reported on 10/12/2020) 25 tablet 2   No current facility-administered medications for this encounter.    REVIEW OF SYSTEMS:  On review of systems, the patient reports that he is doing well overall. He denies any chest pain, shortness of breath, cough, fevers, chills, night sweats, unintended weight changes. He denies any bowel  disturbances, and denies abdominal pain, nausea or vomiting. He denies any new musculoskeletal or joint aches or pains. His IPSS was 3, indicating mild urinary symptoms. His SHIM was 25, indicating he does not have erectile dysfunction. A complete review of systems is obtained and is otherwise negative.   PHYSICAL EXAM:  Wt Readings from Last 3 Encounters:  10/12/20 285 lb 12.8 oz (129.6 kg)  07/16/20 286 lb 3.2 oz (129.8 kg)  05/25/19 253 lb (114.8 kg)   Temp Readings from Last 3 Encounters:  10/12/20 (!) 96.9 F (36.1 C)  01/13/19 98.5 F (36.9 C) (Oral)  10/18/18 98.7 F (37.1 C) (Oral)   BP Readings from Last 3 Encounters:  10/12/20 128/89  07/16/20 118/78  05/25/19 132/80   Pulse Readings from Last 3 Encounters:  10/12/20 75  07/16/20 73  05/25/19 (!) 57   Pain Assessment Pain Score: 0-No pain/10  In general this is a well appearing Caucasian male in no acute distress. He's alert and oriented x4 and appropriate throughout the examination. Cardiopulmonary assessment is negative for acute distress and he exhibits normal effort.    KPS = 100  100 - Normal; no complaints; no evidence of disease. 90   - Able to carry on normal activity; minor signs or symptoms of disease. 80   - Normal activity with effort; some signs or symptoms of disease. 26   - Cares for self; unable to carry on normal activity or to do active work. 60   - Requires occasional assistance, but is able to care for most of his personal needs. 50   - Requires considerable assistance and frequent medical care. 55   - Disabled; requires special care and assistance. 40   - Severely disabled; hospital admission is indicated although death not imminent. 66   - Very sick; hospital admission necessary; active supportive treatment necessary. 10   - Moribund; fatal processes progressing rapidly. 0     - Dead  Karnofsky DA, Abelmann Conley, Craver LS and Burchenal [facility] [phone]) The use of the nitrogen mustards in the  palliative treatment of carcinoma: with particular reference to bronchogenic carcinoma Cancer 1 [phone]   LABORATORY DATA:  Lab Results  Component Value Date   WBC 7.5 01/13/2019   HGB 15.3 01/13/2019   HCT 45.5 01/13/2019   MCV 88.9 01/13/2019   PLT 210 01/13/2019   Lab Results  Component Value Date   NA 139 01/13/2019   K 3.5 01/13/2019   CL 101 01/13/2019   CO2 27 01/13/2019   Lab Results  Component Value Date   ALT 24 10/03/2016   AST 19 10/03/2016   ALKPHOS 88 10/03/2016   BILITOT 0.4 10/03/2016     RADIOGRAPHY: NM Bone Scan Whole Body  Result Date: 09/30/2020 CLINICAL DATA:  Prostate cancer EXAM: NUCLEAR MEDICINE WHOLE BODY BONE SCAN TECHNIQUE: Whole body anterior and posterior images were obtained approximately 3 hours after intravenous injection of radiopharmaceutical. RADIOPHARMACEUTICALS:  19.9 mCi Technetium-48m MDP IV COMPARISON:  None. FINDINGS:  No areas of suspicious osseous uptake to suggest metastatic disease. Degenerative uptake in the left knee and feet. Soft tissue activity unremarkable. IMPRESSION: No evidence of osseous metastatic disease. Electronically Signed   By: Rolm Baptise M.D.   On: 09/30/2020 09:14      IMPRESSION/PLAN: 55 y.o. gentleman with Stage T1c adenocarcinoma of the prostate with a Gleason score of 4+3 and a PSA of 25.    We discussed the patient's workup and outlined the nature of prostate cancer in this setting. The patient's T stage, Gleason's score, and PSA put him into the high risk group. Accordingly, he is eligible for a variety of potential treatment options including prostatectomy or LT-ADT in combination with either 8 weeks of external radiation or 5 weeks of external radiation with an upfront brachytherapy boost.  The patient has multiple medical comorbidities and is adamantly not interested in prostatectomy.  We discussed the available radiation techniques, and focused on the details and logistics of delivery. We discussed and  outlined the risks, benefits, short and long-term effects associated with radiotherapy and compared and contrasted these with prostatectomy. We discussed the role of SpaceOAR gel in reducing the rectal toxicity associated with radiotherapy. We also detailed the role of ADT in the treatment of high risk prostate cancer and outlined the associated side effects that could be expected with this therapy.  We discussed the intentional delay of starting radiotherapy approximately 2 months after initiating ADT, to allow for the radiosensitizing effects of this therapy.  He and his wife were encouraged to ask questions that were answered to their stated satisfaction.  At the end of the conversation, the patient remains undecided but appears to be leaning towards moving forward with brachytherapy boost and use of SpaceOAR gel followed by a 5 week course of daily radiotherapy, concurrent with LT-ADT.  We will share our discussion with Dr. Louis Meckel and move forward with coordinating a follow-up visit to check his testosterone level prior to initiating ADT.  Once he has reached a final decision and assuming he is started on ADT, we will begin coordinating the seed boost procedure for sometime in May 2022, approximately 2 months after the start of ADT.  We will plan to see him back approximately 2 weeks after his procedure and at that time we will proceed with treatment planning for a 5-week course of daily external beam radiotherapy anticipated to begin approximately 3 weeks after his seed boost procedure.  He appears to have a good understanding of his disease and our treatment recommendations which are of curative intent and he is in agreement with the stated plan.  He knows that he is welcome to call at anytime with any further questions or concerns in the interim.   Nicholos Johns, PA-C    Tyler Pita, MD  [facility] Direct Dial: [phone]  Fax: [phone] [email or web address]  Skype   LinkedIn   This document serves as a record of services personally performed by Tyler Pita, MD and Freeman Caldron, PA-C. It was created on their behalf by Wilburn Mylar, a trained medical scribe. The creation of this record is based on the scribe's personal observations and the provider's statements to them. This document has been checked and approved by the attending provider.

## 2020-10-12 NOTE — Consult Note (Signed)
[facility]     10/12/2020   --------------------------------------------------------------------------------   Dylan [name]  MRN: [id number]  DOB: 1966/04/17, 55 year old Male  SSN: [id number]   PRIMARY CARE:  [name]. Delice Lesch, [place]  REFERRING:    PROVIDER:  Louis Meckel, M.D.  TREATING:  Raynelle Bring, M.D.  LOCATION:  [facility] [phone]     --------------------------------------------------------------------------------   CC/HPI: CC: Prostate Cancer   Physician requesting consult: Dr. Burman Nieves  PCP: [name], PA-C  Location of consult: [facility]   Dylan [name] is a 55 year old gentleman with a past medical history significant for coronary artery disease with a prior MI s/p cardiac stent and a strong family history of CAD with most all male family members dying of heart disease before age 6. He also has a history of diabetes, question of stroke or TIA, obesity (275 lbs), and hypertension as well as a prior portal vein thrombus while previously on testosterone replacement therapy. His PSA was noted to be 25.0 prompting an MRI of the prostate on 08/29/20. This indicated a diffuse abnormality of the prostate without a focal lesion and was read as a PI-RADS 3 scan. He underwent a biopsy on 09/04/20 that demonstrated Gleason 4+3=7 adenocarcinoma with 10 out of 12 biopsy cores positive for malignancy.   Family history: No prostate cancer.   Imaging studies:  MRI (08/29/20): No EPE, SVI, LAD, or bone lesions.  CT abdomen/pelvis (09/21/20): Negative for metastatic disease.  Bone scan (09/28/20): Negative for metastatic disease.   PMH: He has a history of CAD with history of MI s/p cardiac stenting, hypertension, diabetes, CVA, and portal vein thrombus while on TRT.  PSH: Bowel resection (2016), Hernia repair with mesh (2019)   TNM stage: cT1c N0 M0  PSA: 25.0  Gleason score:  4+3=7 (GG 3)  Biopsy (09/04/20): 10/12 cores positive  Left: L lateral apex (60%), L apex (80%, 3+4=7), L lateral mid (90%, 4+3=7), L mid (90%, 4+3=7), L lateral base (90%, 4+3=7), L base (95%, 4+3=7)  Right: R apex (30%, 3+4=7), R mid (10%, 3+4=7), R lateral mid (30%, 3+4=7), R base (20%, 4+3=7)  Prostate volume: 30.7 cc   Nomogram  OC disease: 10%  EPE: 87%  SVI: 47%  LNI: 45%  PFS (5 year, 10 year): 25%, 15%   Urinary function: IPSS is 3.  Erectile function: SHIM score is 25.     ALLERGIES: Codeine    MEDICATIONS: Valium 10 mg tablet 2 tablet PO once take one hour prior to procedure  Benicar  Diltiazem 12Hr Er  Lasix 40 mg tablet  Synjardy     GU PSH: Hernia Repair W/mesh, 2019 Locm 300-399Mg/Ml Iodine,1Ml - 09/21/2020 Prostate Needle Biopsy - 09/04/2020 Vasectomy - 2017       PSH Notes: multiple heart cath (2016), bowel resection (2016), septoplasty (2001), meniscus torn (2012)   NON-GU PSH: Surgical Pathology, Gross And Microscopic Examination For Prostate Needle - 09/04/2020     GU PMH: Prostate Cancer - 09/21/2020, - 09/17/2020 Elevated PSA - 09/04/2020, - 07/16/2020, - 07/04/2019, - 05/24/2019 Encounter for sterilization - 2017, - 2017      PMH Notes: H/o heart attack   NON-GU PMH: Coronary Artery Disease Hypertension Stroke/TIA    FAMILY HISTORY: 64 daughters - Daughter 5 sons - Son heart failure - Father Strokes - Father   SOCIAL HISTORY: Marital Status: Married Preferred Language: English Current Smoking Status: Patient does not  smoke anymore. Has not smoked since 03/04/2010. Smoked for 20 years. Smoked 1 pack per day.  Does drink.  Drinks 1 caffeinated drink per day. Patient's occupation Production assistant, radio.    REVIEW OF SYSTEMS:    GU Review Male:   Patient denies stream starts and stops, burning/ pain with urination, hard to postpone urination, get up at night to urinate, leakage of urine, have to strain to urinate , trouble starting  your streams, and frequent urination.  Gastrointestinal (Upper):   Patient denies nausea and vomiting.  Gastrointestinal (Lower):   Patient denies diarrhea and constipation.  Constitutional:   Patient denies fever, night sweats, weight loss, and fatigue.  Skin:   Patient denies skin rash/ lesion and itching.  Eyes:   Patient denies blurred vision and double vision.  Ears/ Nose/ Throat:   Patient denies sore throat and sinus problems.  Hematologic/Lymphatic:   Patient denies swollen glands and easy bruising.  Cardiovascular:   Patient denies leg swelling and chest pains.  Respiratory:   Patient denies cough and shortness of breath.  Endocrine:   Patient denies excessive thirst.  Musculoskeletal:   Patient denies back pain and joint pain.  Neurological:   Patient denies headaches and dizziness.  Psychologic:   Patient denies depression and anxiety.   VITAL SIGNS: None   MULTI-SYSTEM PHYSICAL EXAMINATION:    Constitutional: Well-nourished. No physical deformities. Normally developed. Good grooming.     Complexity of Data:  Lab Test Review:   PSA  Records Review:   Pathology Reports, Previous Patient Records  X-Ray Review: C.T. Abdomen/Pelvis: Reviewed Films.  MRI Prostate GSORAD: Reviewed Films.  Bone Scan: Reviewed Films.     07/19/20  PSA  Total PSA 25.00 ng/mL    PROCEDURES: None   ASSESSMENT:      ICD-10 Details  1 GU:   Prostate Cancer - C61    PLAN:           Document Letter(s):  Created for Patient: Clinical Summary         Notes:   1. High risk prostate cancer: I had a detailed discussion with Mr. Motyka and his wife today. He is well informed about his options and has met with Dr. Tammi Klippel and Dr. Alen Blew already this morning. The patient was counseled about the natural history of prostate cancer and the standard treatment options that are available for prostate cancer. It was explained to him how his age and life expectancy, clinical stage, Gleason score, and PSA  affect his prognosis, the decision to proceed with additional staging studies, as well as how that information influences recommended treatment strategies. We discussed the roles for active surveillance, radiation therapy, surgical therapy, androgen deprivation, as well as ablative therapy options for the treatment of prostate cancer as appropriate to his individual cancer situation. We discussed the risks and benefits of these options with regard to their impact on cancer control and also in terms of potential adverse events, complications, and impact on quality of life particularly related to urinary and sexual function. The patient was encouraged to ask questions throughout the discussion today and all questions were answered to his stated satisfaction. In addition, the patient was provided with and/or directed to appropriate resources and literature for further education about prostate cancer and treatment options. We discussed surgical therapy for prostate cancer including the different available surgical approaches. We discussed, in detail, the risks and expectations of surgery with regard to cancer control, urinary control, and erectile function as well  as the expected postoperative recovery process. Additional risks of surgery including but not limited to bleeding, infection, hernia formation, nerve damage, lymphocele formation, bowel/rectal injury potentially necessitating colostomy, damage to the urinary tract resulting in urine leakage, urethral stricture, and the cardiopulmonary risks such as myocardial infarction, stroke, death, venothromboembolism, etc. were explained. The risk of open surgical conversion for robotic/laparoscopic prostatectomy was also discussed.   He adamantly wishes to avoid surgical therapy. He is interested in proceeding with radiation therapy. We discussed the potential benefits and also the side effects of androgen deprivation in this setting. He is very hesitant to consider  ADT but we had an extensive discussion regarding the pros and cons of this therapy and the potential for improved biochemical disease free survival and even overall survival. He seems convinced that his life expectancy is only about 10 years due to his cardiovascular history and his goal is to maintain his quality of life an disease free status for that length of time. He is less concerned about the next 10-20 years. Understanding this, he wishes to proceed with radiation and will give some consideration to ADT but likely will decline. I will notify Dr. Louis Meckel of his decision and to follow up on his final answer about ADT.   CC: Dr. Derenda Fennel, PA-C  Dr. Tyler Pita  Dr. Zola Button            E & M CODES: We spent 46 minutes dedicated to evaluation and management time, including face to face interaction, discussions on coordination of care, documentation, result review, and discussion with others as applicable.

## 2020-10-12 NOTE — Progress Notes (Signed)
                               Care Plan Summary  Name: Dylan [name]  DOB: 04-Dec-1965   Your Medical Team:   Urologist -  Dr. Raynelle Bring, [facility]  Radiation Oncologist - Dr. Tyler Pita, [facility]   Medical Oncologist - Dr. Zola Button, [place]  Recommendations: 1) Check testosterone levels if starting ADT 2) Radiation, seed boost,  +/- ADT   (hormone injection)   * These recommendations are based on information available as of today's consult.      Recommendations may change depending on the results of further tests or exams.  Next Steps: 1) Consider your options and call Cira Rue, RN with decision    When appointments need to be scheduled, you will be contacted by [facility] and/or [facility].  Questions?  Please do not hesitate to call Cira Rue, RN, BSN, OCN at [phone] any questions or concerns.  Shirlean Mylar is your Oncology Nurse Navigator and is available to assist you while you're receiving your medical care at [facility].

## 2020-10-12 NOTE — Progress Notes (Signed)
[facility] Psychosocial Distress Screening Spiritual Care  Met with Dylan [name] and his wife Dylan [name] in [facility] to introduce [facility] team/resources, reviewing distress screen per protocol.  The patient scored a 1 on the Psychosocial Distress Thermometer which indicates mild distress. Also assessed for distress and other psychosocial needs.   ONCBCN DISTRESS SCREENING 10/12/2020  Screening Type Initial Screening  Distress experienced in past week (1-10) 1  Practical problem type Insurance  Referral to support programs Yes   Dylan [name] takes a direct, matter-of-fact approach to his diagnosis and care. He was in good spirits overall, noting that, "If it weren't for the PSA check, I wouldn't know that I have cancer" because he feels well. Couple's self-care plan includes celebrating their 9th anniversary tonight. They have children ages 29 and 7 at home, and he has two young adults as well.  Follow up needed: No. Per couple, no needs or concerns at this time, but they are aware of ongoing Support Team availability, should needs arise or circumstances change.   [place], [place], [facility] Pager [phone] Voicemail [phone]

## 2020-10-12 NOTE — Progress Notes (Signed)
Reason for the request:   Prostate cancer  HPI: I was asked by Dr. Louis Meckel to evaluate Dylan [name] for the diagnosis of prostate cancer.  He is a 55 year old man with history of coronary artery disease as well as portal vein thrombosis diagnosed in 2016.  His thrombosis was presumably provoked by testosterone replacement therapy.  He was found to have elevated PSA of 25 in December 2021.  He underwent an MRI of the prostate completed in January 2022 which showed a showed PI-RADS 3 diffuse lesion across the entire prostate.  He subsequently underwent prostate biopsy on September 04, 2020 which showed a high volume disease with a Gleason score of 4+3 = 7 with 10 out of 12 cores involved.  Based on these findings he was prior to the prostate cancer multidisciplinary clinic.  He denies any urinary complaints at this time.  He denies hematuria, dysuria or frequency.  Denies any bleeding complications and currently off all blood thinners.    He does not report any headaches, blurry vision, syncope or seizures. Does not report any fevers, chills or sweats.  Does not report any cough, wheezing or hemoptysis.  Does not report any chest pain, palpitation, orthopnea or leg edema.  Does not report any nausea, vomiting or abdominal pain.  Does not report any constipation or diarrhea.  Does not report any skeletal complaints.    Does not report frequency, urgency or hematuria.  Does not report any skin rashes or lesions. Does not report any heat or cold intolerance.  Does not report any lymphadenopathy or petechiae.  Does not report any anxiety or depression.  Remaining review of systems is negative.    Past Medical History:  Diagnosis Date  . CAD S/P percutaneous coronary angioplasty 07/2010; 07/2012; 08/2014   1) 2/'11: MI - 100% RCA - PCI (3 Taxus ION DES 3.0 x 28, 3.0 x 24 & 3.5 x 12 distal-prox); b) ISR of RCA stent - -AnngioSculpt PTCA; c) NSTEMI 1/'16: mild ISR with Thrombosis of RCA Stents (in setting of  Mesenteric Vein Thrombosis) - Cutting Balloon PTCA; c. Re-look Cath 07/2015 & 07/2016: ~15% ISR in RCA,20% p-mLAD & o-pCx.  . Coronary stent thrombosis 08/15/2014  . DM type 2 (diabetes mellitus, type 2) ([place]) 06/07/2017  . Essential hypertension 07/07/2012  . Finding of multiple premature atrial contractions by electrocardiography    Symptomatic palpitations ; worse with poor sleep and fatigue along with dehydration.   . Hyperlipidemia with target LDL less than 70 09/01/2014  . Mesenteric vein thrombosis ([place])   . Migraines    "maybe one/yr" (07/06/2012)  . NSTEMI (non-ST elevated myocardial infarction) ([place]) 07/2010, 08/2014   And unstable angina in February 20 13th; Echo 1/12/'16: Moderate concentric LVH. EF 60-65% with no regional WMA. Gr 1 DD, otherwise normal  . Prostate cancer ([place])   . Stroke [facility]) 2011; 2013   S/P cardiac cath - embolic stroke; denies residual (07/06/2012)  . 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 04/19/2013   No evidence of CVA on MRI/MRA & Dliated Eye Examination. ruptured blood vessel & not occlusion.  :  Past Surgical History:  Procedure Laterality Date  . BOWEL RESECTION N/A 08/28/2014   Procedure: SMALL BOWEL RESECTION;  Surgeon: [name], MD;  Location: [facility];  Service: General;  Laterality: N/A;  . CARDIAC CATHETERIZATION  04/18/2013  . CARDIAC CATHETERIZATION N/A 07/20/2015   Procedure: Left  Heart Cath and Coronary Angiography;  Surgeon: [name], MD;  Location: [facility];  Service: Cardiovascular;  15% ISR in the RCA stent segment, 20% proximal-mid LAD as well as ostial and proximal circumflex. Normal LV function.  Marland Kitchen CARDIAC CATHETERIZATION N/A 07/23/2016   Procedure: Left Heart Cath and Coronary Angiography;  Surgeon: [name], MD;  Location: [facility];  Service: Cardiovascular: E 55-65%. ~15% diffuse RCA ISR, ~20% diffuse  pLAD & pCx  . CORONARY ANGIOPLASTY  07/06/2012; 08/15/2014   a) 12/'13: PTCA of 80% ISR mRCA - AngioSculpt; b) PTCA of  ISR/thrombosis  . CORONARY ANGIOPLASTY WITH STENT PLACEMENT  07/2010   inferior wall MI - 3 Taxus Ion DES (3.0x25mm, 3.0x72mm, 3.5x38mm) to prox and mid RCA  . INCISIONAL HERNIA REPAIR N/A 10/15/2017   Procedure: HERNIA REPAIR INCISIONAL;  Surgeon: [name], MD;  Location: [facility];  Service: General;  Laterality: N/A;  . INSERTION OF MESH N/A 10/15/2017   Procedure: INSERTION OF MESH;  Surgeon: [name], MD;  Location: [facility];  Service: General;  Laterality: N/A;  . LAPAROSCOPIC APPENDECTOMY N/A 08/28/2014   Procedure: APPENDECTOMY LAPAROSCOPIC;  Surgeon: [name], MD;  Location: [facility];  Service: General;  Laterality: N/A;  . LAPAROSCOPY  08/28/2014   Procedure: LAPAROSCOPY DIAGNOSTIC;  Surgeon: [name], MD;  Location: [facility];  Service: General;;  . LEFT HEART CATHETERIZATION WITH CORONARY ANGIOGRAM N/A 06/11/2012   Procedure: LEFT HEART CATHETERIZATION WITH CORONARY ANGIOGRAM;  Surgeon: [name], MD;  Location: [facility];  Service: Cardiovascular;  Laterality: N/A;  . LEFT HEART CATHETERIZATION WITH CORONARY ANGIOGRAM N/A 04/18/2013   Procedure: LEFT HEART CATHETERIZATION WITH CORONARY ANGIOGRAM;  Surgeon: [name], MD;  Location: [facility] CATH LAB;  Service: Cardiovascular;  Laterality: N/A;  . LEFT HEART CATHETERIZATION WITH CORONARY ANGIOGRAM N/A 08/15/2014   Procedure: LEFT HEART CATHETERIZATION WITH CORONARY ANGIOGRAM;  Surgeon: [name], MD;  Location: [facility] CATH LAB;  Service: Cardiovascular;  Laterality: N/A;  . PERCUTANEOUS CORONARY STENT INTERVENTION (PCI-S) N/A 07/06/2012   Procedure: PERCUTANEOUS CORONARY STENT INTERVENTION (PCI-S);  Surgeon: [name], MD;  Location: [facility] CATH LAB;  Service: Cardiovascular;  Laterality: N/A;  . SEPTOPLASTY  2001  . TONSILLECTOMY    . TRANSTHORACIC ECHOCARDIOGRAM  08/15/2014   Moderate  concentric LVH. EF 60-65% with no regional WMA. Gr 1 DD, otherwise normal  :   Current Outpatient Medications:  .  aspirin EC 81 MG tablet, Take 1 tablet (81 mg total) by mouth daily., Disp: 90 tablet, Rfl: 3 .  carvedilol (COREG) 12.5 MG tablet, Take 1 tablet (12.5 mg total) by mouth 2 (two) times daily., Disp: 180 tablet, Rfl: 3 .  Empagliflozin-metFORMIN HCl ER (SYNJARDY XR) 25-1000 MG TB24, Take 1 tablet by mouth daily., Disp: , Rfl:  .  Evolocumab (REPATHA SURECLICK) 546 MG/ML SOAJ, Inject 140 mg into the skin every 14 (fourteen) days., Disp: 6 mL, Rfl: 3 .  furosemide (LASIX) 40 MG tablet, TAKE 2 TABLETS BY MOUTH EVERY MORNING AND 1 TABLET IN THE EVENING, Disp: 270 tablet, Rfl: 3 .  ibuprofen (ADVIL,MOTRIN) 200 MG tablet, Take 600 mg by mouth every 6 (six) hours as needed for headache or moderate pain. , Disp: , Rfl:  .  metoprolol tartrate (LOPRESSOR) 25 MG tablet, TAKE DAILY AS NEEDED FOR PALPITATIONS, Disp: 45 tablet, Rfl: 3 .  nitroGLYCERIN (NITROSTAT) 0.4 MG SL tablet, Place 1 tablet (0.4 mg total) under the tongue every 5 (five) minutes  x 3 doses as needed for chest pain., Disp: 25 tablet, Rfl: 2 .  olmesartan (BENICAR) 40 MG tablet, Take 1 tablet (40 mg total) by mouth daily., Disp: 90 tablet, Rfl: 3 .  Polyethyl Glycol-Propyl Glycol (SYSTANE OP), Place 1 drop into both eyes daily as needed (dry eyes)., Disp: , Rfl:  .  Potassium 99 MG TABS, Take 1 tablet by mouth daily., Disp: , Rfl: :  Allergies  Allergen Reactions  . Crestor [Rosuvastatin] Other (See Comments)    Severe muscle pain and nausea  . Zofran [Ondansetron Hcl] Nausea And Vomiting  . Ace Inhibitors Cough  . Lipitor [Atorvastatin] Other (See Comments)    myalgia  . Codeine Itching  :  Family History  Problem Relation Age of Onset  . Atrial fibrillation Mother   . Mitral valve prolapse Mother   . Coronary artery disease Father        CABG, aortic aneursym,  . Heart attack Father   . Heart attack Paternal  Uncle   . Heart attack Paternal Grandfather   :  Social History   Socioeconomic History  . Marital status: Married    Spouse name: Curt Bears  . Number of children: 4  . Years of education: 58  . Highest education level: Not on file  Occupational History  . Occupation: Pharmacologist: [employer]  Tobacco Use  . Smoking status: Former Smoker    Packs/day: 1.00    Years: 15.00    Pack years: 15.00    Types: Cigarettes    Quit date: 06/30/2010    Years since quitting: 10.2  . Smokeless tobacco: Current User    Types: Snuff  Vaping Use  . Vaping Use: Never used  Substance and Sexual Activity  . Alcohol use: Yes    Alcohol/week: 7.0 standard drinks    Types: 7 Shots of liquor per week    Comment: a  glass of scotch per day  . Drug use: No  . Sexual activity: Yes  Other Topics Concern  . Not on file  Social History Narrative  . Not on file   Social Determinants of Health   Financial Resource Strain: Not on file  Food Insecurity: Not on file  Transportation Needs: Not on file  Physical Activity: Not on file  Stress: Not on file  Social Connections: Not on file  Intimate Partner Violence: Not on file  :  Pertinent items are noted in HPI.  Exam: ECOG 0 General appearance: alert and cooperative appeared without distress. Head: atraumatic without any abnormalities. Eyes: conjunctivae/corneas clear. PERRL.  Sclera anicteric. Throat: lips, mucosa, and tongue normal; without oral thrush or ulcers. Resp: clear to auscultation bilaterally without rhonchi, wheezes or dullness to percussion. Cardio: regular rate and rhythm, S1, S2 normal, no murmur, click, rub or gallop GI: soft, non-tender; bowel sounds normal; no masses,  no organomegaly Skin: Skin color, texture, turgor normal. No rashes or lesions Lymph nodes: Cervical, supraclavicular, and axillary nodes normal. Neurologic: Grossly normal without any motor, sensory or deep tendon reflexes. Musculoskeletal: No  joint deformity or effusion.    NM Bone Scan Whole Body  Result Date: 09/30/2020 CLINICAL DATA:  Prostate cancer EXAM: NUCLEAR MEDICINE WHOLE BODY BONE SCAN TECHNIQUE: Whole body anterior and posterior images were obtained approximately 3 hours after intravenous injection of radiopharmaceutical. RADIOPHARMACEUTICALS:  19.9 mCi Technetium-42m MDP IV COMPARISON:  None. FINDINGS: No areas of suspicious osseous uptake to suggest metastatic disease. Degenerative uptake in the left knee and feet.  Soft tissue activity unremarkable. IMPRESSION: No evidence of osseous metastatic disease. Electronically Signed   By: Rolm Baptise M.D.   On: 09/30/2020 09:14    Assessment and Plan:   55 year old man with prostate cancer diagnosed in February 2022.  His PSA is 25 Gleason score was 4+3 = 7 with high-volume disease including cores.  Staging work-up did not show any evidence of metastatic disease clinically negative bone scan.  His case was discussed today the prostate cancer multidisciplinary clinic and treatment options were reviewed.  He is high risk prostate cancer given his PSA and treatment options include surgery versus radiation.  Given his cardiovascular risk in the past and previous extensive surgery and thrombosis history he would be a high risk surgical candidate.  Medical followed by radiation therapy with long-term ADT was discussed.  We also discussed the role of therapy escalation with abiraterone in addition to androgen deprivation therapy for high risk disease was discussed.  Complication associated with this therapy was discussed in detail.  His complications including osteoporosis, weight gain, sexual dysfunction and complications associated with abiraterone include hypertension, edema and adrenal insufficiency.  After discussion today, I recommended proceeding with surgery and long-term androgen deprivation therapy and with holding any additional therapy escalation at this time.  He is still  not in favor of androgen deprivation therapy despite the known benefit associated with it and he understands that at this time.   All his questions were answered today to his satisfaction.   45  minutes were dedicated to this visit. The time was spent on reviewing laboratory data, imaging studies, discussing treatment options,  and answering questions regarding future plan.      A copy of this consult has been forwarded to the requesting physician.

## 2020-10-15 ENCOUNTER — Telehealth: Payer: Self-pay | Admitting: Oncology

## 2020-10-15 NOTE — Telephone Encounter (Signed)
Checked out appointment. No LOS notes needing to be scheduled. No changes made. 

## 2020-10-19 ENCOUNTER — Encounter: Payer: Self-pay | Admitting: Medical Oncology

## 2020-10-22 ENCOUNTER — Other Ambulatory Visit: Payer: Self-pay

## 2020-10-29 ENCOUNTER — Other Ambulatory Visit: Payer: Self-pay | Admitting: Cardiovascular Disease

## 2020-11-06 ENCOUNTER — Encounter: Payer: Self-pay | Admitting: Oncology

## 2020-11-06 ENCOUNTER — Encounter: Payer: Self-pay | Admitting: Medical Oncology

## 2020-11-06 NOTE — Progress Notes (Signed)
Spoke with patient and he states he had a conversation with Dr. Louis Meckel last and has decided not to do ADT. He is ready to move forward with seed boost and radiation. Ashlyn,PA and [employer]. He is aware we will coordinate with Dr. Louis Meckel.

## 2020-11-07 ENCOUNTER — Telehealth: Payer: Self-pay | Admitting: *Deleted

## 2020-11-07 ENCOUNTER — Other Ambulatory Visit: Payer: Self-pay | Admitting: Urology

## 2020-11-07 DIAGNOSIS — C61 Malignant neoplasm of prostate: Secondary | ICD-10-CM

## 2020-11-07 NOTE — Telephone Encounter (Signed)
CALLED PATIENT TO ASK QUESTIONS, SPOKE WITH PATIENT 

## 2020-11-08 ENCOUNTER — Telehealth: Payer: Self-pay | Admitting: *Deleted

## 2020-11-08 NOTE — Telephone Encounter (Signed)
CALLED PATIENT TO INFORM OF PRE-SEED APPTS. FOR 12-13-20 AND HIS IMPLANT ON 01-10-21, SPOKE WITH PATIENT AND HE IS AWARE OF THESE APPTS.

## 2020-11-21 ENCOUNTER — Telehealth: Payer: Self-pay | Admitting: Cardiovascular Disease

## 2020-11-21 NOTE — Telephone Encounter (Signed)
   [facility] Pre-operative Risk Assessment    Request for surgical clearance:  1. What type of surgery is being performed?  Radioactive Seed Implant for Prostate Cancer  2. When is this surgery scheduled? 01/10/2021  3. What type of clearance is required (medical clearance vs. Pharmacy clearance to hold med vs. Both)?  Medical  4. Are there any medications that need to be held prior to surgery and how long? Nothing needs to be held prior to procedure  5. Practice name and name of physician performing surgery? [facility]  Dr. Louis Meckel  6. What is your office phone number [phone]   7.   What is your office fax number [phone]  8.   Anesthesia type (None, local, MAC, general) ?  General   Larina Bras 11/21/2020, 9:01 AM  _________________________________________________________________   (provider comments below)

## 2020-11-21 NOTE — Telephone Encounter (Signed)
   Name: Dylan [name]  DOB: 1966/02/14  MRN: [id number]   Primary Cardiologist: [name], MD  Chart reviewed as part of pre-operative protocol coverage. Patient was contacted 11/21/2020 in reference to pre-operative risk assessment for pending surgery as outlined below.  Dylan [name] was last seen on 07/16/2020 by [name] PA-C.  Since that day, Dylan [name] has done well without any chest pain or worsening dyspnea.  He is able to accomplish more than 4 METS of activity without any issue.  Therefore, based on ACC/AHA guidelines, the patient would be at acceptable risk for the planned procedure without further cardiovascular testing.   The patient was advised that if he develops new symptoms prior to surgery to contact our office to arrange for a follow-up visit, and he verbalized understanding.  I will route this recommendation to the requesting party via Epic fax function and remove from pre-op pool. Please call with questions.  [place], [place] 11/21/2020, 10:14 AM

## 2020-12-12 ENCOUNTER — Telehealth: Payer: Self-pay | Admitting: *Deleted

## 2020-12-12 NOTE — Telephone Encounter (Signed)
CALLED PATIENT TO REMIND OF PRE-SEED APPTS. FOR 12-13-20, SPOKE WITH PATIENT AND HE IS AWARE OF THESE APPTS. 

## 2020-12-13 ENCOUNTER — Ambulatory Visit (HOSPITAL_COMMUNITY)
Admission: RE | Admit: 2020-12-13 | Discharge: 2020-12-13 | Disposition: A | Discharge status: Home / Self Care | Payer: 59 | Source: Ambulatory Visit | Attending: Urology | Admitting: Urology

## 2020-12-13 ENCOUNTER — Ambulatory Visit
Admission: RE | Admit: 2020-12-13 | Discharge: 2020-12-13 | Disposition: A | Discharge status: Home / Self Care | Payer: 59 | Source: Ambulatory Visit | Attending: Radiation Oncology | Admitting: Radiation Oncology

## 2020-12-13 ENCOUNTER — Encounter (HOSPITAL_COMMUNITY)
Admission: RE | Admit: 2020-12-13 | Discharge: 2020-12-13 | Disposition: A | Discharge status: Home / Self Care | Payer: 59 | Source: Ambulatory Visit | Attending: Urology | Admitting: Urology

## 2020-12-13 ENCOUNTER — Ambulatory Visit
Admission: RE | Admit: 2020-12-13 | Discharge: 2020-12-13 | Disposition: A | Discharge status: Home / Self Care | Payer: 59 | Source: Ambulatory Visit | Attending: Urology | Admitting: Urology

## 2020-12-13 ENCOUNTER — Other Ambulatory Visit: Payer: Self-pay

## 2020-12-13 DIAGNOSIS — C61 Malignant neoplasm of prostate: Secondary | ICD-10-CM

## 2020-12-13 NOTE — Progress Notes (Signed)
  Radiation Oncology         [phone] ________________________________  Name: CANDELARIO STEPPE MRN: [id number]  Date: 12/13/2020  DOB: [birth year]  SIMULATION AND TREATMENT PLANNING NOTE PUBIC ARCH STUDY  JM:EQAST, [name], PA-C  Ardis Hughs, MD  DIAGNOSIS:  55 y.o. gentleman with stage T1c adenocarcinoma of the prostate with a Gleason's score of 4+3 and a PSA of 25 Oncology History  Malignant neoplasm of prostate ([place])  09/04/2020 Cancer Staging   Staging form: Prostate, AJCC 8th Edition - Clinical stage from 09/04/2020: Stage IIIA (cT1c, cN0, cM0, PSA: 25, Grade Group: 3) - Signed by Freeman Caldron, PA-C on 10/12/2020 Histopathologic type: Adenocarcinoma, NOS Stage prefix: Initial diagnosis Prostate specific antigen (PSA) range: 20 or greater Gleason primary pattern: 4 Gleason secondary pattern: 3 Gleason score: 7 Histologic grading system: 5 grade system Number of biopsy cores examined: 12 Number of biopsy cores positive: 10 Location of positive needle core biopsies: Both sides   10/12/2020 Initial Diagnosis   Malignant neoplasm of prostate ([place])       ICD-10-CM   1. Malignant neoplasm of prostate ([place])  C61     COMPLEX SIMULATION:  The patient presented today for evaluation for possible prostate seed implant. He was brought to the radiation planning suite and placed supine on the CT couch. A 3-dimensional image study set was obtained in upload to the planning computer. There, on each axial slice, I contoured the prostate gland. Then, using three-dimensional radiation planning tools I reconstructed the prostate in view of the structures from the transperineal needle pathway to assess for possible pubic arch interference. In doing so, I did not appreciate any pubic arch interference. Also, the patient's prostate volume was estimated based on the drawn structure. The volume was 25 cc.  Given the pubic arch appearance and prostate volume, patient remains a good candidate  to proceed with prostate seed implant. Today, he freely provided informed written consent to proceed.    PLAN: The patient will undergo prostate seed implant.   ________________________________  Sheral Apley. Tammi Klippel, M.D.

## 2021-01-04 ENCOUNTER — Encounter (HOSPITAL_BASED_OUTPATIENT_CLINIC_OR_DEPARTMENT_OTHER): Payer: Self-pay | Admitting: Urology

## 2021-01-07 ENCOUNTER — Other Ambulatory Visit: Payer: Self-pay

## 2021-01-07 ENCOUNTER — Encounter (HOSPITAL_BASED_OUTPATIENT_CLINIC_OR_DEPARTMENT_OTHER): Payer: Self-pay | Admitting: Urology

## 2021-01-07 ENCOUNTER — Encounter: Payer: Self-pay | Admitting: Medical Oncology

## 2021-01-07 NOTE — Progress Notes (Addendum)
ADDENDUM:  Cardiology , Kathyrn Drown NP, re-evaluation pt since pt had stopped his ASA 81mg  as requested by Dr Elgie Congo MDA.  Please refer to note in epic dated 01-09-2021, copy in chart.  Also, Dr C. Green MDA and reviewed new clearance.  ADDENDUM:  Chart reviewed by anesthesia, Dr Norton Blizzard MDA,  Stated pt cardiologist needs to be called ,  inform them pt had stopped is asa month ago, and readdress this in pt clearance.  Also, Dr Elgie Congo agreed with advised pt to start his diabetic med prior to surgery.   Spoke w/ via phone for pre-op interview--- Pt Lab needs dos----no               Lab results------ pt getting CBC, CMP, PT/ PTT done 01-08-2021 @ 1145;  Current ekg / cxr in epic/ chart COVID test -----patient states asymptomatic no test needed  Arrive at -------  1829 on 01-10-2021 NPO after MN NO Solid Food.  Clear liquids from MN until--- 0445 Med rec completed Medications to take morning of surgery ----- Coreg Diabetic medication ----- do not take synjardy morning of surgery Patient instructed no nail polish to be worn day of surgery  Patient instructed to bring photo id and insurance card day of surgery Patient aware to have Driver (ride ) / caregiver    for 24 hours after surgery -- wife, Curt Bears  Patient Special Instructions ----- will do one fleet enema morning of surgery.  Pt was advised to start back taking his synjardy that he had stopped approx 12-17-2020 due to gout flare-up  Pre-Op special Istructions ----- pt has telephone clearance by [name] PA on 11-21-2020 and last cardiology office note  Dated 07-16-2020 , both in epic/ chart  Patient verbalized understanding of instructions that were given at this phone interview. Patient denies shortness of breath, chest pain, fever, cough at this phone interview.   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  Stated last nitro taken approx. 06/ 2021.  Pt was advised to start back taking diabetic pill since he had stopped taking approx 12-17-2020 due to gout flare up.  PCP:  [name] PA (lov 07-05-2020 care everywhere) Cardiologist :  Dr Sallyanne Kuster (lov 07-16-2020 epic) Chest x-ray :  12-13-2020 EKG : [phone] Echo :  05-31-2019 Stress test: 06-08-2017 Event monitor:  08-18-2017 Cardiac Cath : 07-23-2016 Activity level:  Denies sob w/ any activity Sleep Study/ CPAP :  NO Fasting Blood Sugar :      / Checks Blood Sugar -- times a day:  Does not check  Blood Thinner/ Instructions Maryjane Hurter Dose: NO ASA / Instructions/ Last Dose : ASA 81mg  Per pt was taking daily until gout flare-up approx 12-17-2020 was told by pcp to stop taking until resolved, stated gout resolved by has not started back taking since has surgery scheduled on 01-10-2021

## 2021-01-07 NOTE — Progress Notes (Signed)
Received call from patient stating he has lost his pre-op paper work. He is scheduled for brachytherapy 6/9. I asked him to call pre-surgical [phone] and they should be able to provide him with this information. I asked him to call me back if he has any issues. He voiced understanding.

## 2021-01-08 ENCOUNTER — Telehealth: Payer: Self-pay | Admitting: *Deleted

## 2021-01-08 ENCOUNTER — Encounter (HOSPITAL_COMMUNITY)
Admission: RE | Admit: 2021-01-08 | Discharge: 2021-01-08 | Disposition: A | Discharge status: Home / Self Care | Payer: 59 | Source: Ambulatory Visit | Attending: Urology | Admitting: Urology

## 2021-01-08 DIAGNOSIS — Z01812 Encounter for preprocedural laboratory examination: Secondary | ICD-10-CM | POA: Diagnosis present

## 2021-01-08 LAB — CBC
HCT: 46.5 % (ref 39.0–52.0)
Hemoglobin: 16 g/dL (ref 13.0–17.0)
MCH: 30.4 pg (ref 26.0–34.0)
MCHC: 34.4 g/dL (ref 30.0–36.0)
MCV: 88.4 fL (ref 80.0–100.0)
Platelets: 239 10*3/uL (ref 150–400)
RBC: 5.26 MIL/uL (ref 4.22–5.81)
RDW: 13.4 % (ref 11.5–15.5)
WBC: 8 10*3/uL (ref 4.0–10.5)
nRBC: 0 % (ref 0.0–0.2)

## 2021-01-08 LAB — COMPREHENSIVE METABOLIC PANEL
ALT: 23 U/L (ref 0–44)
AST: 22 U/L (ref 15–41)
Albumin: 4.2 g/dL (ref 3.5–5.0)
Alkaline Phosphatase: 78 U/L (ref 38–126)
Anion gap: 10 (ref 5–15)
BUN: 16 mg/dL (ref 6–20)
CO2: 31 mmol/L (ref 22–32)
Calcium: 9.2 mg/dL (ref 8.9–10.3)
Chloride: 100 mmol/L (ref 98–111)
Creatinine, Ser: 1.14 mg/dL (ref 0.61–1.24)
GFR, Estimated: >60 mL/min (ref >60)
Glucose, Bld: 127 mg/dL — ABNORMAL HIGH (ref 70–99)
Potassium: 3.3 mmol/L — ABNORMAL LOW (ref 3.5–5.1)
Sodium: 141 mmol/L (ref 135–145)
Total Bilirubin: 1.2 mg/dL (ref 0.3–1.2)
Total Protein: 7.5 g/dL (ref 6.5–8.1)

## 2021-01-08 LAB — PROTIME-INR
INR: 1 (ref 0.8–1.2)
Prothrombin Time: 13 seconds (ref 11.4–15.2)

## 2021-01-08 LAB — APTT: aPTT: 27 seconds (ref 24–36)

## 2021-01-08 NOTE — Telephone Encounter (Signed)
Message routed to pre-op team to assist with this additional question

## 2021-01-08 NOTE — Telephone Encounter (Signed)
Langley Gauss is calling in regards to Dylan [name]'s aspirin due to this upcoming procedure. She states holding aspirin was not addressed in the clearance they received back via fax, but based on the last OV notes with [name] he advised he would not recommend stopping it. Langley Gauss has spoken with the patient and he admitted he has not been taking it for the past month. Langley Gauss also reports it may be longer. Due to this they are wanting to know what's advised for the patient in regards to this. She has left her personal cell number for callback.Please advise.

## 2021-01-08 NOTE — Telephone Encounter (Signed)
Called patient to remind of lab for 01-08-21 @ 11:45 am, spoke with patient and he is aware of this appt.

## 2021-01-09 ENCOUNTER — Telehealth: Payer: Self-pay | Admitting: *Deleted

## 2021-01-09 NOTE — Anesthesia Preprocedure Evaluation (Addendum)
Anesthesia Evaluation  Patient identified by MRN, date of birth, ID band Patient awake    Reviewed: Allergy & Precautions, NPO status , Patient's Chart, lab work & pertinent test results  Airway Mallampati: II  TM Distance: >3 FB     Dental   Pulmonary neg pulmonary ROS, former smoker,    breath sounds clear to auscultation       Cardiovascular hypertension, + CAD and + Past MI   Rhythm:Regular Rate:Normal     Neuro/Psych    GI/Hepatic negative GI ROS, Neg liver ROS,   Endo/Other  diabetes  Renal/GU negative Renal ROS     Musculoskeletal  (+) Arthritis ,   Abdominal   Peds  Hematology  (+) anemia ,   Anesthesia Other Findings   Reproductive/Obstetrics                            Anesthesia Physical Anesthesia Plan  ASA: III  Anesthesia Plan: General   Post-op Pain Management:    Induction: Intravenous  PONV Risk Score and Plan: 3 and Dexamethasone and Midazolam  Airway Management Planned: LMA  Additional Equipment:   Intra-op Plan:   Post-operative Plan: Extubation in OR  Informed Consent: I have reviewed the patients History and Physical, chart, labs and discussed the procedure including the risks, benefits and alternatives for the proposed anesthesia with the patient or authorized representative who has indicated his/her understanding and acceptance.     Dental advisory given  Plan Discussed with: CRNA and Anesthesiologist  Anesthesia Plan Comments:        Anesthesia Quick Evaluation

## 2021-01-09 NOTE — Telephone Encounter (Signed)
    Dylan [name] DOB:  August 11, 1965  MRN:  [id number]   Primary Cardiologist: [name], MD  Chart reviewed as part of pre-operative protocol coverage. Given past medical history and time since last visit, based on ACC/AHA guidelines, RAVINDRA BARANEK would be at acceptable risk for the planned procedure without further cardiovascular testing.   Clarification to prior clearance: Spoke with Dylan [name] with pre-admission who states that the patient had not been taking his ASA 81mg  for the last month. Anesthesia wanting clarification on risk for procedure. Due to in-stent restenosis, this will place him at slightly higher risk however episode dates back to 2016 and the patient has had no anginal symptoms suggesting progressive CAD. Dylan [name] reports ok to perform the procedure while on ASA 81mg  QD. I personally spoke with the patient who self discontinued ASA after a gout flare up however he is willing to resume this. I will have him restart ASA 81mg  QD today and undergo his procedure tomorrow.   The patient was advised that if he develops new symptoms prior to surgery to contact our office to arrange for a follow-up visit, and he verbalized understanding.  I will route this recommendation to the requesting party via Epic fax function and remove from pre-op pool.  Please call with questions.  Kathyrn Drown, NP 01/09/2021, 8:22 AM

## 2021-01-09 NOTE — Telephone Encounter (Signed)
Unclear as to what the additional question is. The initial pre-op states there were no medications that would need to be held which is why this was not addressed in the pre-op clearance. I am happy to speak with the surgery team for more clarification. I will need the best number to reach them at. Caryl Pina, looks like you spoke to her, can you provide this number?   Thanks

## 2021-01-09 NOTE — Telephone Encounter (Signed)
Called patient to remind of procedure for 01-10-21, spoke with patient and he is aware of this procedure

## 2021-01-10 ENCOUNTER — Ambulatory Visit (HOSPITAL_COMMUNITY): Payer: 59

## 2021-01-10 ENCOUNTER — Encounter (HOSPITAL_BASED_OUTPATIENT_CLINIC_OR_DEPARTMENT_OTHER)
Admission: RE | Disposition: A | Discharge status: Home / Self Care | Payer: Self-pay | Source: Home / Self Care | Attending: Urology

## 2021-01-10 ENCOUNTER — Ambulatory Visit (HOSPITAL_BASED_OUTPATIENT_CLINIC_OR_DEPARTMENT_OTHER): Payer: 59 | Admitting: Anesthesiology

## 2021-01-10 ENCOUNTER — Other Ambulatory Visit: Payer: Self-pay

## 2021-01-10 ENCOUNTER — Ambulatory Visit (HOSPITAL_BASED_OUTPATIENT_CLINIC_OR_DEPARTMENT_OTHER)
Admission: RE | Admit: 2021-01-10 | Discharge: 2021-01-10 | Disposition: A | Discharge status: Home / Self Care | Payer: 59 | Attending: Urology | Admitting: Urology

## 2021-01-10 ENCOUNTER — Encounter (HOSPITAL_BASED_OUTPATIENT_CLINIC_OR_DEPARTMENT_OTHER): Payer: Self-pay | Admitting: Urology

## 2021-01-10 DIAGNOSIS — Z8249 Family history of ischemic heart disease and other diseases of the circulatory system: Secondary | ICD-10-CM | POA: Insufficient documentation

## 2021-01-10 DIAGNOSIS — Z885 Allergy status to narcotic agent status: Secondary | ICD-10-CM | POA: Insufficient documentation

## 2021-01-10 DIAGNOSIS — I10 Essential (primary) hypertension: Secondary | ICD-10-CM | POA: Diagnosis not present

## 2021-01-10 DIAGNOSIS — Z79899 Other long term (current) drug therapy: Secondary | ICD-10-CM | POA: Diagnosis not present

## 2021-01-10 DIAGNOSIS — Z8673 Personal history of transient ischemic attack (TIA), and cerebral infarction without residual deficits: Secondary | ICD-10-CM | POA: Insufficient documentation

## 2021-01-10 DIAGNOSIS — E119 Type 2 diabetes mellitus without complications: Secondary | ICD-10-CM | POA: Insufficient documentation

## 2021-01-10 DIAGNOSIS — Z955 Presence of coronary angioplasty implant and graft: Secondary | ICD-10-CM | POA: Insufficient documentation

## 2021-01-10 DIAGNOSIS — Z7984 Long term (current) use of oral hypoglycemic drugs: Secondary | ICD-10-CM | POA: Insufficient documentation

## 2021-01-10 DIAGNOSIS — E669 Obesity, unspecified: Secondary | ICD-10-CM | POA: Diagnosis not present

## 2021-01-10 DIAGNOSIS — I2510 Atherosclerotic heart disease of native coronary artery without angina pectoris: Secondary | ICD-10-CM | POA: Diagnosis not present

## 2021-01-10 DIAGNOSIS — Z6835 Body mass index (BMI) 35.0-35.9, adult: Secondary | ICD-10-CM | POA: Diagnosis not present

## 2021-01-10 DIAGNOSIS — C61 Malignant neoplasm of prostate: Secondary | ICD-10-CM | POA: Diagnosis not present

## 2021-01-10 DIAGNOSIS — Z87891 Personal history of nicotine dependence: Secondary | ICD-10-CM | POA: Diagnosis not present

## 2021-01-10 DIAGNOSIS — I252 Old myocardial infarction: Secondary | ICD-10-CM | POA: Diagnosis not present

## 2021-01-10 DIAGNOSIS — Z86718 Personal history of other venous thrombosis and embolism: Secondary | ICD-10-CM | POA: Diagnosis not present

## 2021-01-10 HISTORY — DX: Unspecified osteoarthritis, unspecified site: M19.90

## 2021-01-10 HISTORY — DX: Localized edema: R60.0

## 2021-01-10 HISTORY — PX: CYSTOSCOPY: SHX5120

## 2021-01-10 HISTORY — DX: Chronic gout, unspecified, without tophus (tophi): M1A.9XX0

## 2021-01-10 HISTORY — PX: RADIOACTIVE SEED IMPLANT: SHX5150

## 2021-01-10 HISTORY — DX: Nocturia: R35.1

## 2021-01-10 HISTORY — DX: Coronary angioplasty status: Z98.61

## 2021-01-10 HISTORY — PX: SPACE OAR INSTILLATION: SHX6769

## 2021-01-10 HISTORY — DX: Gout, unspecified: M10.9

## 2021-01-10 HISTORY — DX: Atrial premature depolarization: I49.1

## 2021-01-10 HISTORY — DX: Personal history of other diseases of male genital organs: Z87.438

## 2021-01-10 HISTORY — DX: Atherosclerotic heart disease of native coronary artery without angina pectoris: I25.10

## 2021-01-10 HISTORY — DX: Old myocardial infarction: I25.2

## 2021-01-10 HISTORY — DX: Personal history of traumatic brain injury: Z87.820

## 2021-01-10 LAB — GLUCOSE, CAPILLARY
Glucose-Capillary: 138 mg/dL — ABNORMAL HIGH (ref 70–99)
Glucose-Capillary: 152 mg/dL — ABNORMAL HIGH (ref 70–99)

## 2021-01-10 SURGERY — INSERTION, RADIATION SOURCE, PROSTATE
Anesthesia: General | Site: Prostate

## 2021-01-10 MED ORDER — PROPOFOL 10 MG/ML IV BOLUS
INTRAVENOUS | Status: DC | PRN
  Administered 2021-01-10: 50 mg via INTRAVENOUS
  Administered 2021-01-10: 250 mg via INTRAVENOUS

## 2021-01-10 MED ORDER — CIPROFLOXACIN IN D5W 400 MG/200ML IV SOLN
INTRAVENOUS | Status: AC
  Filled 2021-01-10: qty 200

## 2021-01-10 MED ORDER — CIPROFLOXACIN IN D5W 400 MG/200ML IV SOLN
400.0000 mg | INTRAVENOUS | Status: AC
  Administered 2021-01-10: 400 mg via INTRAVENOUS

## 2021-01-10 MED ORDER — FENTANYL CITRATE (PF) 100 MCG/2ML IJ SOLN: 25.0000 ug | INTRAMUSCULAR | Status: DC | PRN

## 2021-01-10 MED ORDER — DEXAMETHASONE SODIUM PHOSPHATE 10 MG/ML IJ SOLN
INTRAMUSCULAR | Status: AC
  Filled 2021-01-10: qty 1

## 2021-01-10 MED ORDER — TAMSULOSIN HCL 0.4 MG PO CAPS: 0.4000 mg | ORAL_CAPSULE | Freq: Every day | ORAL | 0 refills | Status: DC

## 2021-01-10 MED ORDER — PHENYLEPHRINE HCL (PRESSORS) 10 MG/ML IV SOLN
INTRAVENOUS | Status: AC
  Filled 2021-01-10: qty 1

## 2021-01-10 MED ORDER — LIDOCAINE 2% (20 MG/ML) 5 ML SYRINGE
INTRAMUSCULAR | Status: DC | PRN
  Administered 2021-01-10: 100 mg via INTRAVENOUS

## 2021-01-10 MED ORDER — IOHEXOL 300 MG/ML  SOLN
INTRAMUSCULAR | Status: DC | PRN
  Administered 2021-01-10: 7 mL

## 2021-01-10 MED ORDER — LIDOCAINE HCL (PF) 2 % IJ SOLN
INTRAMUSCULAR | Status: AC
  Filled 2021-01-10: qty 5

## 2021-01-10 MED ORDER — KETOROLAC TROMETHAMINE 30 MG/ML IJ SOLN
INTRAMUSCULAR | Status: DC | PRN
  Administered 2021-01-10: 30 mg via INTRAVENOUS

## 2021-01-10 MED ORDER — PHENYLEPHRINE HCL-NACL 10-0.9 MG/250ML-% IV SOLN
INTRAVENOUS | Status: DC | PRN
  Administered 2021-01-10: 50 ug/min via INTRAVENOUS

## 2021-01-10 MED ORDER — MIDAZOLAM HCL 5 MG/5ML IJ SOLN
INTRAMUSCULAR | Status: DC | PRN
  Administered 2021-01-10: 2 mg via INTRAVENOUS

## 2021-01-10 MED ORDER — SODIUM CHLORIDE (PF) 0.9 % IJ SOLN
INTRAMUSCULAR | Status: DC | PRN
  Administered 2021-01-10: 10 mL via INTRAVENOUS

## 2021-01-10 MED ORDER — MIDAZOLAM HCL 2 MG/2ML IJ SOLN
INTRAMUSCULAR | Status: AC
  Filled 2021-01-10: qty 2

## 2021-01-10 MED ORDER — FENTANYL CITRATE (PF) 100 MCG/2ML IJ SOLN
INTRAMUSCULAR | Status: DC | PRN
  Administered 2021-01-10 (×2): 50 ug via INTRAVENOUS

## 2021-01-10 MED ORDER — DEXAMETHASONE SODIUM PHOSPHATE 10 MG/ML IJ SOLN
INTRAMUSCULAR | Status: DC | PRN
  Administered 2021-01-10: 8 mg via INTRAVENOUS

## 2021-01-10 MED ORDER — LACTATED RINGERS IV SOLN: INTRAVENOUS | Status: DC

## 2021-01-10 MED ORDER — PROPOFOL 500 MG/50ML IV EMUL
INTRAVENOUS | Status: AC
  Filled 2021-01-10: qty 50

## 2021-01-10 MED ORDER — KETOROLAC TROMETHAMINE 30 MG/ML IJ SOLN
INTRAMUSCULAR | Status: AC
  Filled 2021-01-10: qty 1

## 2021-01-10 MED ORDER — FENTANYL CITRATE (PF) 100 MCG/2ML IJ SOLN
INTRAMUSCULAR | Status: AC
  Filled 2021-01-10: qty 2

## 2021-01-10 MED ORDER — FLEET ENEMA 7-19 GM/118ML RE ENEM: 1.0000 | ENEMA | Freq: Once | RECTAL | Status: DC

## 2021-01-10 MED ORDER — TAMSULOSIN HCL 0.4 MG PO CAPS
0.4000 mg | ORAL_CAPSULE | Freq: Every day | ORAL | 1 refills | Status: DC
Start: 2021-01-10 — End: 2021-05-15

## 2021-01-10 MED ORDER — STERILE WATER FOR IRRIGATION IR SOLN
Status: DC | PRN
  Administered 2021-01-10: 3 mL

## 2021-01-10 MED ORDER — SODIUM CHLORIDE 0.9 % IR SOLN
Status: DC | PRN
  Administered 2021-01-10: 100 mL

## 2021-01-10 MED ORDER — TRAMADOL HCL 50 MG PO TABS: 50.0000 mg | ORAL_TABLET | Freq: Four times a day (QID) | ORAL | 0 refills | Status: DC | PRN

## 2021-01-10 SURGICAL SUPPLY — 37 items
BAG DRN RND TRDRP ANRFLXCHMBR (UROLOGICAL SUPPLIES) ×2
BAG URINE DRAIN 2000ML AR STRL (UROLOGICAL SUPPLIES) ×3 IMPLANT
BLADE CLIPPER SENSICLIP SURGIC (BLADE) ×3 IMPLANT
CATH FOLEY 2WAY SLVR  5CC 16FR (CATHETERS) ×2
CATH FOLEY 2WAY SLVR 5CC 16FR (CATHETERS) ×2 IMPLANT
CATH ROBINSON RED A/P 16FR (CATHETERS) IMPLANT
CATH ROBINSON RED A/P 20FR (CATHETERS) ×3 IMPLANT
CLOTH BEACON ORANGE TIMEOUT ST (SAFETY) ×3 IMPLANT
CNTNR URN SCR LID CUP LEK RST (MISCELLANEOUS) ×4 IMPLANT
CONT SPEC 4OZ STRL OR WHT (MISCELLANEOUS) ×6
COVER BACK TABLE 60X90IN (DRAPES) ×3 IMPLANT
COVER MAYO STAND STRL (DRAPES) ×3 IMPLANT
DRAPE C-ARM 35X43 STRL (DRAPES) IMPLANT
DRSG TEGADERM 4X4.75 (GAUZE/BANDAGES/DRESSINGS) ×3 IMPLANT
DRSG TEGADERM 8X12 (GAUZE/BANDAGES/DRESSINGS) ×3 IMPLANT
GAUZE SPONGE 4X4 12PLY STRL LF (GAUZE/BANDAGES/DRESSINGS) ×3 IMPLANT
GLOVE SURG ENC MOIS LTX SZ6.5 (GLOVE) ×3 IMPLANT
GLOVE SURG ENC MOIS LTX SZ7.5 (GLOVE) ×6 IMPLANT
GLOVE SURG ENC MOIS LTX SZ8 (GLOVE) ×3 IMPLANT
GLOVE SURG ORTHO LTX SZ8.5 (GLOVE) IMPLANT
GLOVE SURG POLYISO LF SZ6.5 (GLOVE) IMPLANT
GOWN STRL REUS W/TWL XL LVL3 (GOWN DISPOSABLE) ×3 IMPLANT
HOLDER FOLEY CATH W/STRAP (MISCELLANEOUS) IMPLANT
I-Seed AgX100 ×216 IMPLANT
IMPL SPACEOAR VUE SYSTEM (Spacer) ×2 IMPLANT
IMPLANT SPACEOAR VUE SYSTEM (Spacer) ×3 IMPLANT
IV NS 1000ML (IV SOLUTION) ×3
IV NS 1000ML BAXH (IV SOLUTION) ×2 IMPLANT
KIT TURNOVER CYSTO (KITS) ×3 IMPLANT
MARKER SKIN DUAL TIP RULER LAB (MISCELLANEOUS) ×3 IMPLANT
PACK CYSTO (CUSTOM PROCEDURE TRAY) ×3 IMPLANT
SUT BONE WAX W31G (SUTURE) IMPLANT
SYR 10ML LL (SYRINGE) ×6 IMPLANT
TOWEL OR 17X26 10 PK STRL BLUE (TOWEL DISPOSABLE) ×3 IMPLANT
UNDERPAD 30X36 HEAVY ABSORB (UNDERPADS AND DIAPERS) ×9 IMPLANT
WATER STERILE IRR 3000ML UROMA (IV SOLUTION) IMPLANT
WATER STERILE IRR 500ML POUR (IV SOLUTION) ×3 IMPLANT

## 2021-01-10 NOTE — Anesthesia Procedure Notes (Signed)
Procedure Name: LMA Insertion Date/Time: 01/10/2021 8:56 AM Performed by: Bonney Aid, CRNA Pre-anesthesia Checklist: Patient identified, Emergency Drugs available, Suction available and Patient being monitored Patient Re-evaluated:Patient Re-evaluated prior to induction Oxygen Delivery Method: Circle system utilized Preoxygenation: Pre-oxygenation with 100% oxygen Induction Type: IV induction LMA: LMA inserted LMA Size: 5.0 Number of attempts: 1 Airway Equipment and Method: Bite block Placement Confirmation: positive ETCO2 Tube secured with: Tape Dental Injury: Teeth and Oropharynx as per pre-operative assessment

## 2021-01-10 NOTE — Transfer of Care (Signed)
Immediate Anesthesia Transfer of Care Note  Patient: Dylan [name]  Procedure(s) Performed: RADIOACTIVE SEED IMPLANT/BRACHYTHERAPY IMPLANT (Prostate) SPACE OAR INSTILLATION (Prostate) CYSTOSCOPY FLEXIBLE (Bladder)  Patient Location: PACU  Anesthesia Type:General  Level of Consciousness: drowsy  Airway & Oxygen Therapy: Patient Spontanous Breathing and Patient connected to nasal cannula oxygen  Post-op Assessment: Report given to RN  Post vital signs: Reviewed and stable  Last Vitals:  Vitals Value Taken Time  BP 139/82 01/10/21 0901  Temp    Pulse 70 01/10/21 0903  Resp 16 01/10/21 0903  SpO2 92 % 01/10/21 0903  Vitals shown include unvalidated device data.  Last Pain:  Vitals:   01/10/21 0629  TempSrc: Oral  PainSc: 0-No pain      Patients Stated Pain Goal: 3 (57/32/20 2542)  Complications: No notable events documented.

## 2021-01-10 NOTE — Discharge Instructions (Addendum)
DISCHARGE INSTRUCTIONS FOR PROSTATE SEED IMPLANTATION   Antibiotics You may be given a prescription for an antibiotic to take when you arrive home. If so, be sure to take every tablet in the bottle, even if you are feeling better before the prescription is finished. If you begin itching, notice a rash or start to swell on your trunk, arms, legs and/or throat, immediately stop taking the antibiotic and call your Urologist. Diet Resume your usual diet when you return home. To keep your bowels moving easily and softly, drink prune, apple and cranberry juice at room temperature. You may also take a stool softener, such as Colace, which is available without prescription at local pharmacies. Daily activities ? No driving or heavy lifting for at least two days after the implant. ? No bike riding, horseback riding or riding lawn mowers for the first month after the implant. ? Any strenuous physical activity should be approved by your doctor before you resume it. Sexual relations You may resume sexual relations two weeks after the procedure. A condom should be used for the first two weeks. Your semen may be dark brown or black; this is normal and is related bleeding that may have occurred during the implant. Postoperative swelling Expect swelling and bruising of the scrotum and perineum (the area between the scrotum and anus). Both the swelling and the bruising should resolve in l or 2 weeks. Ice packs and over- the-counter medications such as Tylenol, Advil or Aleve may lessen your discomfort. Postoperative urination Most men experience burning on urination and/or urinary frequency. If this becomes bothersome, contact your Urologist.  Medication can be prescribed to relieve these problems.  It is normal to have some blood in your urine for a few days after the implant. Special instructions related to the seeds It is unlikely that you will pass an Iodine-125 seed in your urine. The seeds are silver in color  and are about as large as a grain of rice. If you pass a seed, do not handle it with your fingers. Use a spoon to place it in an envelope or jar in place this in base occluded area such as the garage or basement for return to the radiation clinic at your convenience.  Contact your doctor for ? Temperature greater than 101 F ? Increasing pain ? Inability to urinate Follow-up  You should have follow up with your urologist and radiation oncologist about 3 weeks after the procedure. General information regarding Iodine seeds ? Iodine-125 is a low energy radioactive material. It is not deeply penetrating and loses energy at short distances. Your prostate will absorb the radiation. Objects that are touched or used by the patient do not become radioactive. ? Body wastes (urine and stool) or body fluids (saliva, tears, semen or blood) are not radioactive. ? The Nuclear Regulatory Commission (NRC) has determined that no radiation precautions are needed for patients undergoing Iodine-125 seed implantation. The NRC states that such patients do not present a risk to the people around them, including young children and pregnant women. However, in keeping with the general principle that radiation exposure should be kept as low reasonably possible, we suggest the following: ? Children and pets should not sit on the patient's lap for the first two (2) weeks after the implant. ? Pregnant (or possibly pregnant) women should avoid prolonged, close contact with the patient for the first two (2) weeks after the implant. ? A distance of three (3) feet is acceptable. ? At a distance of three (  3) feet, there is no limit to the length of time anyone can be with the patient.   Post Anesthesia Home Care Instructions  Activity: Get plenty of rest for the remainder of the day. A responsible individual must stay with you for 24 hours following the procedure.  For the next 24 hours, DO NOT: -Drive a car -Operate  machinery -Drink alcoholic beverages -Take any medication unless instructed by your physician -Make any legal decisions or sign important papers.  Meals: Start with liquid foods such as gelatin or soup. Progress to regular foods as tolerated. Avoid greasy, spicy, heavy foods. If nausea and/or vomiting occur, drink only clear liquids until the nausea and/or vomiting subsides. Call your physician if vomiting continues.  Special Instructions/Symptoms: Your throat may feel dry or sore from the anesthesia or the breathing tube placed in your throat during surgery. If this causes discomfort, gargle with warm salt water. The discomfort should disappear within 24 hours.  If you had a scopolamine patch placed behind your ear for the management of post- operative nausea and/or vomiting:  1. The medication in the patch is effective for 72 hours, after which it should be removed.  Wrap patch in a tissue and discard in the trash. Wash hands thoroughly with soap and water. 2. You may remove the patch earlier than 72 hours if you experience unpleasant side effects which may include dry mouth, dizziness or visual disturbances. 3. Avoid touching the patch. Wash your hands with soap and water after contact with the patch.       

## 2021-01-10 NOTE — Op Note (Signed)
Preoperative diagnosis:  Clinical stage TI C adenocarcinoma the prostate  Postoperative diagnosis:  Same  Procedure:  #1 I-125 prostate seed implantation  #2 cystourethroscopy #3 instillation of SpaceOAR biogel  Surgeon: Louis Meckel, M.D. Radiation Oncologist: Tyler Pita, M.D.  Anesthesia: Gen.   Indications: Patient  was diagnosed with clinical stage TI C prostate cancer. We had extensive discussion with him about treatment options versus. He elected to proceed with seed implantation. He underwent consultation my office as well as with Dr. Tyler Pita. He appeared to understand the advantages disadvantages potential risks of this treatment option. Full informed consent has been obtained. The patient is had preoperative ciprofloxacin. PAS compression boots were placed.   Technique and findings: Patient was brought the operating room where he had  successful induction of general anesthesia. He was placed in lithotomy position and prepped and draped in usual manner. Appropriate surgical timeout was performed. Radiation oncology department placed a transrectal ultrasound probe anchoring stand. Foley catheter with contrast in the balloon was inserted without difficulty. Anchoring needles were placed within the prostate.  Real-time contouring of the urethra prostate and rectum were performed and the dosing parameters were established. Targeted dose was 145 gray. We then came to the operating suite suite for placement of the needles. A second timeout was performed. All needle passage was done with real-time transrectal ultrasound guidance with the sagittal plane. A total of 18 needles were placed.  72 active seeds were implanted.  The brachytherapy template was then removed.  A site in the midline was selected on the perineum for placement of an 18 g needle with saline.  The needle was advanced above the rectum and below Denonvillier's fascia to the mid gland and confirmed to be in the midline  on transverse imaging.  One cc of saline was injected confirming appropriate expansion of this space.  A total of 5 cc of saline was then injected to open the space further bilaterally.  The saline syringe was then removed and the SpaceOAR hydrogel was injected with good distribution bilaterally.A Foley catheter was removed and flexible cystoscopy failed to show any seeds outside the prostate.  The patient was brought to recovery room in stable condition.

## 2021-01-10 NOTE — Interval H&P Note (Signed)
History and Physical Interval Note:  01/10/2021 7:25 AM  Dylan [name]  has presented today for surgery, with the diagnosis of PROSTATE CANCER.  The various methods of treatment have been discussed with the patient and family. After consideration of risks, benefits and other options for treatment, the patient has consented to  Procedure(s): RADIOACTIVE SEED IMPLANT/BRACHYTHERAPY IMPLANT (N/A) SPACE OAR INSTILLATION (N/A) as a surgical intervention.  The patient's history has been reviewed, patient examined, no change in status, stable for surgery.  I have reviewed the patient's chart and labs.  Questions were answered to the patient's satisfaction.     Ardis Hughs

## 2021-01-10 NOTE — H&P (Signed)
CC: Prostate Cancer   Physician requesting consult: Dr. Burman Nieves  PCP: [name], PA-C  Location of consult: [facility]   Dylan [name] is a 55 year old gentleman with a past medical history significant for coronary artery disease with a prior MI s/p cardiac stent and a strong family history of CAD with most all male family members dying of heart disease before age 31. He also has a history of diabetes, question of stroke or TIA, obesity (275 lbs), and hypertension as well as a prior portal vein thrombus while previously on testosterone replacement therapy. His PSA was noted to be 25.0 prompting an MRI of the prostate on 08/29/20. This indicated a diffuse abnormality of the prostate without a focal lesion and was read as a PI-RADS 3 scan. He underwent a biopsy on 09/04/20 that demonstrated Gleason 4+3=7 adenocarcinoma with 10 out of 12 biopsy cores positive for malignancy.   Family history: No prostate cancer.   Imaging studies:  MRI (08/29/20): No EPE, SVI, LAD, or bone lesions.  CT abdomen/pelvis (09/21/20): Negative for metastatic disease.  Bone scan (09/28/20): Negative for metastatic disease.   PMH: He has a history of CAD with history of MI s/p cardiac stenting, hypertension, diabetes, CVA, and portal vein thrombus while on TRT.  PSH: Bowel resection (2016), Hernia repair with mesh (2019)   TNM stage: cT1c N0 M0  PSA: 25.0  Gleason score: 4+3=7 (GG 3)  Biopsy (09/04/20): 10/12 cores positive  Left: L lateral apex (60%), L apex (80%, 3+4=7), L lateral mid (90%, 4+3=7), L mid (90%, 4+3=7), L lateral base (90%, 4+3=7), L base (95%, 4+3=7)  Right: R apex (30%, 3+4=7), R mid (10%, 3+4=7), R lateral mid (30%, 3+4=7), R base (20%, 4+3=7)  Prostate volume: 30.7 cc   Nomogram  OC disease: 10%  EPE: 87%  SVI: 47%  LNI: 45%  PFS (5 year, 10 year): 25%, 15%   Urinary function: IPSS is 3.  Erectile function: SHIM score is 25.      ALLERGIES: Codeine    MEDICATIONS: Valium 10 mg tablet 2 tablet PO once take one hour prior to procedure  Benicar  Diltiazem 12Hr Er  Lasix 40 mg tablet  Synjardy     GU PSH: Hernia Repair W/mesh, 2019 Locm 300-399Mg/Ml Iodine,1Ml - 09/21/2020 Prostate Needle Biopsy - 09/04/2020 Vasectomy - 2017       PSH Notes: multiple heart cath (2016), bowel resection (2016), septoplasty (2001), meniscus torn (2012)   NON-GU PSH: Surgical Pathology, Gross And Microscopic Examination For Prostate Needle - 09/04/2020     GU PMH: Prostate Cancer - 09/21/2020, - 09/17/2020 Elevated PSA - 09/04/2020, - 07/16/2020, - 07/04/2019, - 05/24/2019 Encounter for sterilization - 2017, - 2017      PMH Notes: H/o heart attack   NON-GU PMH: Coronary Artery Disease Hypertension Stroke/TIA    FAMILY HISTORY: 9 daughters - Daughter 5 sons - Son heart failure - Father Strokes - Father   SOCIAL HISTORY: Marital Status: Married Preferred Language: English Current Smoking Status: Patient does not smoke anymore. Has not smoked since 03/04/2010. Smoked for 20 years. Smoked 1 pack per day.  Does drink.  Drinks 1 caffeinated drink per day. Patient's occupation Production assistant, radio.    REVIEW OF SYSTEMS:    GU Review Male:   Patient denies stream starts and stops, burning/ pain with urination, hard to postpone urination, get up at night to urinate, leakage of urine, have to strain  to urinate , trouble starting your streams, and frequent urination.  Gastrointestinal (Upper):   Patient denies nausea and vomiting.  Gastrointestinal (Lower):   Patient denies diarrhea and constipation.  Constitutional:   Patient denies fever, night sweats, weight loss, and fatigue.  Skin:   Patient denies skin rash/ lesion and itching.  Eyes:   Patient denies blurred vision and double vision.  Ears/ Nose/ Throat:   Patient denies sore throat and sinus problems.  Hematologic/Lymphatic:   Patient denies swollen glands  and easy bruising.  Cardiovascular:   Patient denies leg swelling and chest pains.  Respiratory:   Patient denies cough and shortness of breath.  Endocrine:   Patient denies excessive thirst.  Musculoskeletal:   Patient denies back pain and joint pain.  Neurological:   Patient denies headaches and dizziness.  Psychologic:   Patient denies depression and anxiety.   VITAL SIGNS: None   MULTI-SYSTEM PHYSICAL EXAMINATION:    Constitutional: Well-nourished. No physical deformities. Normally developed. Good grooming.     Complexity of Data:  Lab Test Review:   PSA  Records Review:   Pathology Reports, Previous Patient Records  X-Ray Review: C.T. Abdomen/Pelvis: Reviewed Films.  MRI Prostate GSORAD: Reviewed Films.  Bone Scan: Reviewed Films.     07/19/20  PSA  Total PSA 25.00 ng/mL    PROCEDURES: None   ASSESSMENT:      ICD-10 Details  1 GU:   Prostate Cancer - C61    PLAN:           Document Letter(s):  Created for Patient: Clinical Summary         Notes:   1. High risk prostate cancer: I had a detailed discussion with Dylan [name] and his wife today. He is well informed about his options and has met with Dr. Tammi Klippel and Dr. Alen Blew already this morning. The patient was counseled about the natural history of prostate cancer and the standard treatment options that are available for prostate cancer. It was explained to him how his age and life expectancy, clinical stage, Gleason score, and PSA affect his prognosis, the decision to proceed with additional staging studies, as well as how that information influences recommended treatment strategies. We discussed the roles for active surveillance, radiation therapy, surgical therapy, androgen deprivation, as well as ablative therapy options for the treatment of prostate cancer as appropriate to his individual cancer situation. We discussed the risks and benefits of these options with regard to their impact on cancer control and also in  terms of potential adverse events, complications, and impact on quality of life particularly related to urinary and sexual function. The patient was encouraged to ask questions throughout the discussion today and all questions were answered to his stated satisfaction. In addition, the patient was provided with and/or directed to appropriate resources and literature for further education about prostate cancer and treatment options. We discussed surgical therapy for prostate cancer including the different available surgical approaches. We discussed, in detail, the risks and expectations of surgery with regard to cancer control, urinary control, and erectile function as well as the expected postoperative recovery process. Additional risks of surgery including but not limited to bleeding, infection, hernia formation, nerve damage, lymphocele formation, bowel/rectal injury potentially necessitating colostomy, damage to the urinary tract resulting in urine leakage, urethral stricture, and the cardiopulmonary risks such as myocardial infarction, stroke, death, venothromboembolism, etc. were explained. The risk of open surgical conversion for robotic/laparoscopic prostatectomy was also discussed.   He adamantly wishes to avoid  surgical therapy. He is interested in proceeding with radiation therapy. We discussed the potential benefits and also the side effects of androgen deprivation in this setting. He is very hesitant to consider ADT but we had an extensive discussion regarding the pros and cons of this therapy and the potential for improved biochemical disease free survival and even overall survival. He seems convinced that his life expectancy is only about 10 years due to his cardiovascular history and his goal is to maintain his quality of life an disease free status for that length of time. He is less concerned about the next 10-20 years. Understanding this, he wishes to proceed with radiation and will give some  consideration to ADT but likely will decline. I will notify Dr. Louis Meckel of his decision and to follow up on his final answer about ADT.

## 2021-01-10 NOTE — Progress Notes (Signed)
  Radiation Oncology         [phone] ________________________________  Name: Dylan [name] MRN: [id number]  Date: 01/10/2021  DOB: 1966/08/04       Prostate Seed Implant  [name], [name], PA-C  No ref. provider found  DIAGNOSIS: 55 y.o. gentleman with stage T1c adenocarcinoma of the prostate with a Gleason's score of 4+3 and a PSA of 25  Oncology History  Malignant neoplasm of prostate ([place])  09/04/2020 Cancer Staging   Staging form: Prostate, AJCC 8th Edition - Clinical stage from 09/04/2020: Stage IIIA (cT1c, cN0, cM0, PSA: 25, Grade Group: 3) - Signed by Freeman Caldron, PA-C on 10/12/2020  Histopathologic type: Adenocarcinoma, NOS  Stage prefix: Initial diagnosis  Prostate specific antigen (PSA) range: 20 or greater  Gleason primary pattern: 4  Gleason secondary pattern: 3  Gleason score: 7  Histologic grading system: 5 grade system  Number of biopsy cores examined: 12  Number of biopsy cores positive: 10  Location of positive needle core biopsies: Both sides    10/12/2020 Initial Diagnosis   Malignant neoplasm of prostate ([place])      No diagnosis found.  PROCEDURE: Insertion of radioactive I-125 seeds into the prostate gland.  RADIATION DOSE: 110 Gy, boost therapy.  TECHNIQUE: Dylan [name] was brought to the operating room with the urologist. He was placed in the dorsolithotomy position. He was catheterized and a rectal tube was inserted. The perineum was shaved, prepped and draped. The ultrasound probe was then introduced into the rectum to see the prostate gland.  TREATMENT DEVICE: A needle grid was attached to the ultrasound probe stand and anchor needles were placed.  3D PLANNING: The prostate was imaged in 3D using a sagittal sweep of the prostate probe. These images were transferred to the planning computer. There, the prostate, urethra and rectum were defined on each axial reconstructed image. Then, the software created an optimized 3D  plan and a few seed positions were adjusted. The quality of the plan was reviewed using [facility] information for the target and the following two organs at risk:  Urethra and Rectum.  Then the accepted plan was printed and handed off to the radiation therapist.  Under my supervision, the custom loading of the seeds and spacers was carried out and loaded into sealed vicryl sleeves.  These pre-loaded needles were then placed into the needle holder.Marland Kitchen  PROSTATE VOLUME STUDY:  Using transrectal ultrasound the volume of the prostate was verified to be 33 cc.  SPECIAL TREATMENT PROCEDURE/SUPERVISION AND HANDLING: The pre-loaded needles were then delivered under sagittal guidance. A total of 18 needles were used to deposit 72 seeds in the prostate gland. The individual seed activity was 0.277 mCi.  SpaceOAR:  Yes  COMPLEX SIMULATION: At the end of the procedure, an anterior radiograph of the pelvis was obtained to document seed positioning and count. Cystoscopy was performed to check the urethra and bladder.  MICRODOSIMETRY: At the end of the procedure, the patient was emitting 0.042 mR/hr at 1 meter. Accordingly, he was considered safe for hospital discharge.  PLAN: The patient will return to the radiation oncology clinic for post implant CT dosimetry in three weeks.   ________________________________  Sheral Apley Tammi Klippel, M.D.

## 2021-01-10 NOTE — Anesthesia Postprocedure Evaluation (Signed)
Anesthesia Post Note  Patient: Dylan [name]  Procedure(s) Performed: RADIOACTIVE SEED IMPLANT/BRACHYTHERAPY IMPLANT (Prostate) SPACE OAR INSTILLATION (Prostate) CYSTOSCOPY FLEXIBLE (Bladder)     Patient location during evaluation: PACU Anesthesia Type: General Level of consciousness: awake Pain management: pain level controlled Vital Signs Assessment: post-procedure vital signs reviewed and stable Respiratory status: spontaneous breathing Cardiovascular status: stable Postop Assessment: no apparent nausea or vomiting Anesthetic complications: no   No notable events documented.  Last Vitals:  Vitals:   01/10/21 0901 01/10/21 0915  BP:  (!) 145/87  Pulse: 72 71  Resp: 15 18  Temp: 36.8 C   SpO2: 94% 99%    Last Pain:  Vitals:   01/10/21 0901  TempSrc:   PainSc: Asleep                 Pranish Akhavan

## 2021-01-11 ENCOUNTER — Encounter (HOSPITAL_BASED_OUTPATIENT_CLINIC_OR_DEPARTMENT_OTHER): Payer: Self-pay | Admitting: Urology

## 2021-01-15 ENCOUNTER — Other Ambulatory Visit: Payer: Self-pay

## 2021-01-15 MED ORDER — REPATHA SURECLICK 140 MG/ML ~~LOC~~ SOAJ: 140.0000 mg | SUBCUTANEOUS | 11 refills | Status: DC

## 2021-01-24 ENCOUNTER — Telehealth: Payer: Self-pay | Admitting: *Deleted

## 2021-01-24 NOTE — Telephone Encounter (Signed)
Called patient to remind of sim appt. for 01-25-21- arrival time- 8:45 am @ [facility], spoke with patient and he is aware of this appt.

## 2021-01-25 ENCOUNTER — Other Ambulatory Visit: Payer: Self-pay

## 2021-01-25 ENCOUNTER — Ambulatory Visit
Admission: RE | Admit: 2021-01-25 | Discharge: 2021-01-25 | Disposition: A | Discharge status: Home / Self Care | Payer: 59 | Source: Ambulatory Visit | Attending: Radiation Oncology | Admitting: Radiation Oncology

## 2021-01-25 DIAGNOSIS — C61 Malignant neoplasm of prostate: Secondary | ICD-10-CM | POA: Diagnosis present

## 2021-01-25 DIAGNOSIS — Z510 Encounter for antineoplastic radiation therapy: Secondary | ICD-10-CM | POA: Insufficient documentation

## 2021-01-25 NOTE — Progress Notes (Signed)
  Radiation Oncology         [phone]) [phone] ________________________________  Name: Dylan [name] MRN: [id number]  Date: 01/25/2021  DOB: 1966-05-29  SIMULATION AND TREATMENT PLANNING NOTE    ICD-10-CM   1. Malignant neoplasm of prostate ([place])  C61       DIAGNOSIS:  55 y.o. gentleman with stage T1c adenocarcinoma of the prostate with a Gleason's score of 4+3 and a PSA of 25  NARRATIVE:  The patient was brought to the [facility].  Identity was confirmed.  All relevant records and images related to the planned course of therapy were reviewed.  The patient freely provided informed written consent to proceed with treatment after reviewing the details related to the planned course of therapy. The consent form was witnessed and verified by the simulation staff.  Then, the patient was set-up in a stable reproducible supine position for radiation therapy.  A vacuum lock pillow device was custom fabricated to position his legs in a reproducible immobilized position.  Then, I performed a urethrogram under sterile conditions to identify the prostatic apex.  CT images were obtained.  Surface markings were placed.  The CT images were loaded into the planning software.  Then the prostate target and avoidance structures including the rectum, bladder, bowel and hips were contoured.  Treatment planning then occurred.  The radiation prescription was entered and confirmed.  A total of one complex treatment devices were fabricated. I have requested : Intensity Modulated Radiotherapy (IMRT) is medically necessary for this case for the following reason:  Rectal sparing.Marland Kitchen  PLAN:  The patient will receive 45 Gy in 25 fractions of 1.8 Gy, to supplement an up-front prostate seed implant boost of 110 Gy to achieve a total nominal dose of 155 Gy.  ________________________________  Sheral Apley Tammi Klippel, M.D.

## 2021-01-25 NOTE — Progress Notes (Signed)
  Radiation Oncology         [phone] ________________________________  Name: Dylan [name] MRN: [id number]  Date: 01/25/2021  DOB: Jun 10, 1966  COMPLEX SIMULATION NOTE  NARRATIVE:  The patient was brought to the [facility] today following prostate seed implantation approximately one month ago.  Identity was confirmed.  All relevant records and images related to the planned course of therapy were reviewed.  Then, the patient was set-up supine.  CT images were obtained.  The CT images were loaded into the planning software.  Then the prostate and rectum were contoured.  Treatment planning then occurred.  The implanted iodine 125 seeds were identified by the physics staff for projection of radiation distribution  I have requested : 3D Simulation  I have requested a DVH of the following structures: Prostate and rectum.    ________________________________  Sheral Apley Tammi Klippel, M.D.

## 2021-01-29 DIAGNOSIS — Z510 Encounter for antineoplastic radiation therapy: Secondary | ICD-10-CM | POA: Diagnosis not present

## 2021-01-31 ENCOUNTER — Ambulatory Visit
Admission: RE | Admit: 2021-01-31 | Discharge: 2021-01-31 | Disposition: A | Discharge status: Home / Self Care | Payer: 59 | Source: Ambulatory Visit | Attending: Radiation Oncology | Admitting: Radiation Oncology

## 2021-01-31 DIAGNOSIS — Z510 Encounter for antineoplastic radiation therapy: Secondary | ICD-10-CM | POA: Diagnosis not present

## 2021-02-01 ENCOUNTER — Ambulatory Visit
Admission: RE | Admit: 2021-02-01 | Discharge: 2021-02-01 | Disposition: A | Discharge status: Home / Self Care | Payer: 59 | Source: Ambulatory Visit | Attending: Radiation Oncology | Admitting: Radiation Oncology

## 2021-02-01 DIAGNOSIS — C61 Malignant neoplasm of prostate: Secondary | ICD-10-CM | POA: Insufficient documentation

## 2021-02-01 DIAGNOSIS — Z510 Encounter for antineoplastic radiation therapy: Secondary | ICD-10-CM | POA: Insufficient documentation

## 2021-02-01 NOTE — Progress Notes (Signed)
Pt here for patient teaching.    Pt given Radiation and You booklet and skin care instructions.    Reviewed areas of pertinence such as diarrhea, fatigue, hair loss, nausea and vomiting, sexual and fertility changes, skin changes, and urinary and bladder changes .   Pt able to give teach back of to pat skin, use unscented/gentle soap, use baby wipes, have Imodium on hand, and drink plenty of water,avoid applying anything to skin within 4 hours of treatment.   Pt verbalizes understanding of information given and will contact nursing with any questions or concerns.    [email or web address]

## 2021-02-05 ENCOUNTER — Other Ambulatory Visit: Payer: Self-pay

## 2021-02-05 ENCOUNTER — Ambulatory Visit
Admission: RE | Admit: 2021-02-05 | Discharge: 2021-02-05 | Disposition: A | Discharge status: Home / Self Care | Payer: 59 | Source: Ambulatory Visit | Attending: Radiation Oncology | Admitting: Radiation Oncology

## 2021-02-05 DIAGNOSIS — Z510 Encounter for antineoplastic radiation therapy: Secondary | ICD-10-CM | POA: Diagnosis not present

## 2021-02-06 ENCOUNTER — Ambulatory Visit: Payer: 59

## 2021-02-06 ENCOUNTER — Ambulatory Visit
Admission: RE | Admit: 2021-02-06 | Discharge: 2021-02-06 | Disposition: A | Discharge status: Home / Self Care | Payer: 59 | Source: Ambulatory Visit | Attending: Radiation Oncology | Admitting: Radiation Oncology

## 2021-02-06 DIAGNOSIS — Z510 Encounter for antineoplastic radiation therapy: Secondary | ICD-10-CM | POA: Diagnosis not present

## 2021-02-07 ENCOUNTER — Ambulatory Visit
Admission: RE | Admit: 2021-02-07 | Discharge: 2021-02-07 | Disposition: A | Discharge status: Home / Self Care | Payer: 59 | Source: Ambulatory Visit | Attending: Radiation Oncology | Admitting: Radiation Oncology

## 2021-02-07 ENCOUNTER — Ambulatory Visit: Payer: 59

## 2021-02-07 DIAGNOSIS — Z510 Encounter for antineoplastic radiation therapy: Secondary | ICD-10-CM | POA: Diagnosis not present

## 2021-02-08 ENCOUNTER — Ambulatory Visit
Admission: RE | Admit: 2021-02-08 | Discharge: 2021-02-08 | Disposition: A | Discharge status: Home / Self Care | Payer: 59 | Source: Ambulatory Visit | Attending: Radiation Oncology | Admitting: Radiation Oncology

## 2021-02-08 ENCOUNTER — Other Ambulatory Visit: Payer: Self-pay

## 2021-02-08 DIAGNOSIS — Z510 Encounter for antineoplastic radiation therapy: Secondary | ICD-10-CM | POA: Diagnosis not present

## 2021-02-10 ENCOUNTER — Ambulatory Visit: Payer: 59

## 2021-02-11 ENCOUNTER — Other Ambulatory Visit: Payer: Self-pay

## 2021-02-11 ENCOUNTER — Ambulatory Visit
Admission: RE | Admit: 2021-02-11 | Discharge: 2021-02-11 | Disposition: A | Discharge status: Home / Self Care | Payer: 59 | Source: Ambulatory Visit | Attending: Radiation Oncology | Admitting: Radiation Oncology

## 2021-02-11 DIAGNOSIS — Z510 Encounter for antineoplastic radiation therapy: Secondary | ICD-10-CM | POA: Diagnosis not present

## 2021-02-12 ENCOUNTER — Ambulatory Visit
Admission: RE | Admit: 2021-02-12 | Discharge: 2021-02-12 | Disposition: A | Discharge status: Home / Self Care | Payer: 59 | Source: Ambulatory Visit | Attending: Radiation Oncology | Admitting: Radiation Oncology

## 2021-02-12 DIAGNOSIS — Z510 Encounter for antineoplastic radiation therapy: Secondary | ICD-10-CM | POA: Diagnosis not present

## 2021-02-13 ENCOUNTER — Other Ambulatory Visit: Payer: Self-pay

## 2021-02-13 ENCOUNTER — Ambulatory Visit
Admission: RE | Admit: 2021-02-13 | Discharge: 2021-02-13 | Disposition: A | Discharge status: Home / Self Care | Payer: 59 | Source: Ambulatory Visit | Attending: Radiation Oncology | Admitting: Radiation Oncology

## 2021-02-13 DIAGNOSIS — Z510 Encounter for antineoplastic radiation therapy: Secondary | ICD-10-CM | POA: Diagnosis not present

## 2021-02-14 ENCOUNTER — Other Ambulatory Visit: Payer: Self-pay

## 2021-02-14 ENCOUNTER — Ambulatory Visit
Admission: RE | Admit: 2021-02-14 | Discharge: 2021-02-14 | Disposition: A | Discharge status: Home / Self Care | Payer: 59 | Source: Ambulatory Visit | Attending: Radiation Oncology | Admitting: Radiation Oncology

## 2021-02-14 DIAGNOSIS — Z510 Encounter for antineoplastic radiation therapy: Secondary | ICD-10-CM | POA: Diagnosis not present

## 2021-02-15 ENCOUNTER — Ambulatory Visit
Admission: RE | Admit: 2021-02-15 | Discharge: 2021-02-15 | Disposition: A | Discharge status: Home / Self Care | Payer: 59 | Source: Ambulatory Visit | Attending: Radiation Oncology | Admitting: Radiation Oncology

## 2021-02-15 ENCOUNTER — Other Ambulatory Visit: Payer: Self-pay

## 2021-02-15 DIAGNOSIS — Z510 Encounter for antineoplastic radiation therapy: Secondary | ICD-10-CM | POA: Diagnosis not present

## 2021-02-17 ENCOUNTER — Ambulatory Visit: Payer: 59

## 2021-02-18 ENCOUNTER — Ambulatory Visit
Admission: RE | Admit: 2021-02-18 | Discharge: 2021-02-18 | Disposition: A | Discharge status: Home / Self Care | Payer: 59 | Source: Ambulatory Visit | Attending: Radiation Oncology | Admitting: Radiation Oncology

## 2021-02-18 DIAGNOSIS — Z510 Encounter for antineoplastic radiation therapy: Secondary | ICD-10-CM | POA: Diagnosis not present

## 2021-02-19 ENCOUNTER — Ambulatory Visit
Admission: RE | Admit: 2021-02-19 | Discharge: 2021-02-19 | Disposition: A | Discharge status: Home / Self Care | Payer: 59 | Source: Ambulatory Visit | Attending: Radiation Oncology | Admitting: Radiation Oncology

## 2021-02-19 DIAGNOSIS — Z510 Encounter for antineoplastic radiation therapy: Secondary | ICD-10-CM | POA: Diagnosis not present

## 2021-02-20 ENCOUNTER — Ambulatory Visit
Admission: RE | Admit: 2021-02-20 | Discharge: 2021-02-20 | Disposition: A | Discharge status: Home / Self Care | Payer: 59 | Source: Ambulatory Visit | Attending: Radiation Oncology | Admitting: Radiation Oncology

## 2021-02-20 ENCOUNTER — Other Ambulatory Visit: Payer: Self-pay

## 2021-02-20 DIAGNOSIS — Z510 Encounter for antineoplastic radiation therapy: Secondary | ICD-10-CM | POA: Diagnosis not present

## 2021-02-21 ENCOUNTER — Ambulatory Visit: Payer: 59 | Admitting: Radiation Oncology

## 2021-02-21 ENCOUNTER — Ambulatory Visit
Admission: RE | Admit: 2021-02-21 | Discharge: 2021-02-21 | Disposition: A | Discharge status: Home / Self Care | Payer: 59 | Source: Ambulatory Visit | Attending: Radiation Oncology | Admitting: Radiation Oncology

## 2021-02-21 DIAGNOSIS — Z510 Encounter for antineoplastic radiation therapy: Secondary | ICD-10-CM | POA: Diagnosis not present

## 2021-02-22 ENCOUNTER — Encounter: Payer: Self-pay | Admitting: Radiation Oncology

## 2021-02-22 ENCOUNTER — Ambulatory Visit
Admission: RE | Admit: 2021-02-22 | Discharge: 2021-02-22 | Disposition: A | Discharge status: Home / Self Care | Payer: 59 | Source: Ambulatory Visit | Attending: Radiation Oncology | Admitting: Radiation Oncology

## 2021-02-22 DIAGNOSIS — Z510 Encounter for antineoplastic radiation therapy: Secondary | ICD-10-CM | POA: Diagnosis not present

## 2021-02-24 NOTE — Progress Notes (Signed)
  Radiation Oncology         [phone] ________________________________  Name: Dylan [name] MRN: [id number]  Date: 02/22/2021  DOB: September 18, 1965  3D Planning Note   Prostate Brachytherapy Post-Implant Dosimetry  Diagnosis: 55 y.o. gentleman with stage T1c adenocarcinoma of the prostate with a Gleason's score of 4+3 and a PSA of 25  Narrative: On a previous date, Dylan [name] returned following prostate seed implantation for post implant planning. He underwent CT scan complex simulation to delineate the three-dimensional structures of the pelvis and demonstrate the radiation distribution.  Since that time, the seed localization, and complex isodose planning with dose volume histograms have now been completed.  Results:   Prostate Coverage - The dose of radiation delivered to the 90% or more of the prostate gland (D90) was 113.58% of the prescription dose. This exceeds our goal of greater than 90%. Rectal Sparing - The volume of rectal tissue receiving the prescription dose or higher was 0.0 cc. This falls under our thresholds tolerance of 1.0 cc.  Impression: The prostate seed implant appears to show adequate target coverage and appropriate rectal sparing.  Plan:  The patient will continue to follow with urology for ongoing PSA determinations. I would anticipate a high likelihood for local tumor control with minimal risk for rectal morbidity.  ________________________________  Sheral Apley Tammi Klippel, M.D.

## 2021-02-25 ENCOUNTER — Ambulatory Visit
Admission: RE | Admit: 2021-02-25 | Discharge: 2021-02-25 | Disposition: A | Discharge status: Home / Self Care | Payer: 59 | Source: Ambulatory Visit | Attending: Radiation Oncology | Admitting: Radiation Oncology

## 2021-02-25 ENCOUNTER — Other Ambulatory Visit: Payer: Self-pay

## 2021-02-25 DIAGNOSIS — Z510 Encounter for antineoplastic radiation therapy: Secondary | ICD-10-CM | POA: Diagnosis not present

## 2021-02-26 ENCOUNTER — Ambulatory Visit
Admission: RE | Admit: 2021-02-26 | Discharge: 2021-02-26 | Disposition: A | Discharge status: Home / Self Care | Payer: 59 | Source: Ambulatory Visit | Attending: Radiation Oncology | Admitting: Radiation Oncology

## 2021-02-26 DIAGNOSIS — Z510 Encounter for antineoplastic radiation therapy: Secondary | ICD-10-CM | POA: Diagnosis not present

## 2021-02-27 ENCOUNTER — Other Ambulatory Visit: Payer: Self-pay

## 2021-02-27 ENCOUNTER — Ambulatory Visit
Admission: RE | Admit: 2021-02-27 | Discharge: 2021-02-27 | Disposition: A | Discharge status: Home / Self Care | Payer: 59 | Source: Ambulatory Visit | Attending: Radiation Oncology | Admitting: Radiation Oncology

## 2021-02-27 DIAGNOSIS — Z510 Encounter for antineoplastic radiation therapy: Secondary | ICD-10-CM | POA: Diagnosis not present

## 2021-02-28 ENCOUNTER — Ambulatory Visit
Admission: RE | Admit: 2021-02-28 | Discharge: 2021-02-28 | Disposition: A | Discharge status: Home / Self Care | Payer: 59 | Source: Ambulatory Visit | Attending: Radiation Oncology | Admitting: Radiation Oncology

## 2021-02-28 DIAGNOSIS — Z510 Encounter for antineoplastic radiation therapy: Secondary | ICD-10-CM | POA: Diagnosis not present

## 2021-03-01 ENCOUNTER — Ambulatory Visit
Admission: RE | Admit: 2021-03-01 | Discharge: 2021-03-01 | Disposition: A | Discharge status: Home / Self Care | Payer: 59 | Source: Ambulatory Visit | Attending: Radiation Oncology | Admitting: Radiation Oncology

## 2021-03-01 ENCOUNTER — Encounter: Payer: Self-pay | Admitting: Urology

## 2021-03-01 ENCOUNTER — Other Ambulatory Visit: Payer: Self-pay

## 2021-03-01 DIAGNOSIS — C61 Malignant neoplasm of prostate: Secondary | ICD-10-CM

## 2021-03-01 DIAGNOSIS — Z510 Encounter for antineoplastic radiation therapy: Secondary | ICD-10-CM | POA: Diagnosis not present

## 2021-03-04 ENCOUNTER — Ambulatory Visit: Payer: 59

## 2021-03-05 ENCOUNTER — Ambulatory Visit: Payer: 59

## 2021-03-06 ENCOUNTER — Ambulatory Visit: Payer: 59

## 2021-03-07 ENCOUNTER — Ambulatory Visit: Payer: 59

## 2021-03-08 ENCOUNTER — Ambulatory Visit: Payer: 59

## 2021-03-11 ENCOUNTER — Ambulatory Visit: Payer: 59

## 2021-03-12 ENCOUNTER — Ambulatory Visit: Payer: 59

## 2021-03-13 ENCOUNTER — Ambulatory Visit: Payer: 59

## 2021-03-14 ENCOUNTER — Ambulatory Visit: Payer: 59

## 2021-03-15 ENCOUNTER — Ambulatory Visit: Payer: 59

## 2021-03-18 ENCOUNTER — Ambulatory Visit: Payer: 59

## 2021-03-19 ENCOUNTER — Ambulatory Visit: Payer: 59

## 2021-04-12 ENCOUNTER — Other Ambulatory Visit: Payer: Self-pay

## 2021-04-12 MED ORDER — OLMESARTAN MEDOXOMIL 40 MG PO TABS: 40.0000 mg | ORAL_TABLET | Freq: Every day | ORAL | 0 refills | Status: DC

## 2021-04-16 ENCOUNTER — Encounter: Payer: Self-pay | Admitting: Urology

## 2021-04-16 NOTE — Progress Notes (Signed)
Patient reports mild fatigue and dysuria. No other symptoms to report at this time. Flomax taken as directed.  Meaningful use complete. I-PSS Score of 6 (mild).  Patient notified of 10:30am-04/17/21 telephone visit and understands.

## 2021-04-16 NOTE — Progress Notes (Signed)
  Radiation Oncology         [phone]) [phone] ________________________________  Name: Dylan [name] MRN: [id number]  Date: 03/01/2021  DOB: January 28, 1966  End of Treatment Note  Diagnosis:   55 y.o. gentleman with stage T1c adenocarcinoma of the prostate with a Gleason's score of 4+3 and a PSA of 25     Indication for treatment:  Curative, Definitive Radiotherapy       Radiation treatment dates:     01/10/21 01/31/21 - 03/01/21  Site/dose:  1. Insertion of radioactive I-125 seeds into the prostate gland.;110 Gy, boost therapy 2. The prostate, seminal vesicles, and pelvic lymph nodes were treated to 45 Gy in 25 fractions of 1.8 Gy, to supplement an up-front prostate seed implant boost of 110 Gy to achieve a total nominal dose of 155 Gy.  Beams/energy:  1. Brachytherapy with SpaceOAR gel placement. A total of 18 needles were used to deposit 72 seeds in the prostate gland. The individual seed activity was 0.277 mCi. 2. The prostate, seminal vesicles, and pelvic lymph nodes were treated using VMAT intensity modulated radiotherapy delivering 6 megavolt photons. Image guidance was performed with CB-CT studies prior to each fraction. He was immobilized with a body fix lower extremity mold.   Narrative: The patient tolerated radiation treatment relatively well.   The patient experienced some minor urinary irritation and modest fatigue.  He reported mild dysuria that was managed with AZO as well as increased frequency urgency and pelvic discomfort that was improved with Flomax which was started following his seed boost procedure.  He also experienced occasional diarrhea that was managed with Imodium as needed.  Plan: The patient has completed radiation treatment. He will return to radiation oncology clinic for routine followup in one month. I advised him to call or return sooner if he has any questions or concerns related to his recovery or treatment. ________________________________  Sheral Apley.  Tammi Klippel, M.D.

## 2021-04-17 ENCOUNTER — Ambulatory Visit
Admission: RE | Admit: 2021-04-17 | Discharge: 2021-04-17 | Disposition: A | Discharge status: Home / Self Care | Payer: 59 | Source: Ambulatory Visit | Attending: Urology | Admitting: Urology

## 2021-04-17 DIAGNOSIS — C61 Malignant neoplasm of prostate: Secondary | ICD-10-CM

## 2021-04-17 NOTE — Progress Notes (Signed)
Radiation Oncology         [phone] ________________________________  Name: Dylan [name] MRN: [id number]  Date: 04/17/2021  DOB: May 20, 1966  Post Treatment Note  CC: [name], PA-C  Ardis Hughs, MD  Diagnosis:   55 y.o. gentleman with stage T1c adenocarcinoma of the prostate with a Gleason's score of 4+3 and a PSA of 25     Interval Since Last Radiation:  6.5 weeks   01/10/21: Insertion of radioactive I-125 seeds into the prostate gland.;110 Gy, boost therapy 01/31/21 - 03/01/21: The prostate, seminal vesicles, and pelvic lymph nodes were treated to 45 Gy in 25 fractions of 1.8 Gy, to supplement an up-front prostate seed implant boost of 110 Gy to achieve a total nominal dose of 155 Gy.  Narrative:  The patient returns today for routine follow-up.  He tolerated radiation treatment relatively well with only minor urinary irritation and modest fatigue.  He reported mild dysuria that was managed with AZO as well as increased frequency urgency and pelvic discomfort that was improved with Flomax which was started following his seed boost procedure.  He also experienced occasional diarrhea that was managed with Imodium as needed.                              On review of systems, the patient states that he is doing well in general.  Unfortunately, he and his wife were just diagnosed with COVID this week.  He has made good progress regarding his LUTS and continues taking Flomax as prescribed.  He does have some residual hesitancy, intermittency, mild dysuria at the start of his stream, occasional weak flow of stream but feels that he empties his bladder well on voiding.  His current IPSS score is 6, indicating mild urinary symptoms only.  He has also noticed some mild pelvic discomfort with ejaculation but reports that his sexual function has remained intact which he is quite pleased with.  He reports a healthy appetite and is maintaining his weight.  He denies any abdominal pain,  nausea, vomiting, diarrhea or constipation.  Overall, he is pleased with his progress to date.  ALLERGIES:  is allergic to crestor [rosuvastatin], zofran [ondansetron hcl], ace inhibitors, lipitor [atorvastatin], and codeine.  Meds: Current Outpatient Medications  Medication Sig Dispense Refill   aspirin EC 81 MG tablet Take 81 mg by mouth daily. Swallow whole.     carvedilol (COREG) 12.5 MG tablet Take 1 tablet (12.5 mg total) by mouth 2 (two) times daily. 180 tablet 3   Cholecalciferol (VITAMIN D3 PO) Take 1 capsule by mouth daily.     Empagliflozin-metFORMIN HCl ER (SYNJARDY XR) 25-1000 MG TB24 Take 1 tablet by mouth daily.     Evolocumab (REPATHA SURECLICK) [id number] MG/ML SOAJ Inject 140 mg into the skin every 14 (fourteen) days. 2 mL 11   furosemide (LASIX) 40 MG tablet TAKE 2 TABLETS BY MOUTH EVERY MORNING AND 1 TABLET IN THE EVENING 270 tablet 3   nitroGLYCERIN (NITROSTAT) 0.4 MG SL tablet Place 1 tablet (0.4 mg total) under the tongue every 5 (five) minutes x 3 doses as needed for chest pain. 25 tablet 2   olmesartan (BENICAR) 40 MG tablet Take 1 tablet (40 mg total) by mouth daily. 90 tablet 0   Potassium 99 MG TABS Take 1 tablet by mouth daily.     traMADol (ULTRAM) 50 MG tablet Take 1-2 tablets (50-100 mg total) by mouth every  6 (six) hours as needed for moderate pain. 15 tablet 0   Propylene Glycol (SYSTANE COMPLETE) 0.6 % SOLN Apply 1 drop to eye daily. (Patient not taking: Reported on 04/16/2021)     tamsulosin (FLOMAX) 0.4 MG CAPS capsule Take 1 capsule (0.4 mg total) by mouth daily. (Patient not taking: Reported on 04/16/2021) 30 capsule 1   No current facility-administered medications for this encounter.    Physical Findings:  vitals were not taken for this visit.  Pain Assessment Pain Score: 0-No pain/10   Lab Findings: Lab Results  Component Value Date   WBC 8.0 01/08/2021   HGB 16.0 01/08/2021   HCT 46.5 01/08/2021   MCV 88.4 01/08/2021   PLT 239 01/08/2021      Radiographic Findings: No results found.  Impression/Plan: 1. 55 y.o. gentleman with stage T1c adenocarcinoma of the prostate with a Gleason's score of 4+3 and a PSA of 25.       He will continue to follow up with urology for ongoing PSA determinations and has an appointment scheduled for labs on 05/20/2021 and will see Dr. Louis Meckel the following week. He understands what to expect with regards to PSA monitoring going forward. I will look forward to following his response to treatment via correspondence with urology, and would be happy to continue to participate in his care if clinically indicated. I talked to the patient about what to expect in the future, including his risk for erectile dysfunction and rectal bleeding. I encouraged him to call or return to the office if he has any questions regarding his previous radiation or possible radiation side effects. He was comfortable with this plan and will follow up as needed.     Nicholos Johns, PA-C

## 2021-04-18 ENCOUNTER — Other Ambulatory Visit: Payer: Self-pay | Admitting: Urology

## 2021-04-18 DIAGNOSIS — C61 Malignant neoplasm of prostate: Secondary | ICD-10-CM

## 2021-04-24 ENCOUNTER — Emergency Department (HOSPITAL_BASED_OUTPATIENT_CLINIC_OR_DEPARTMENT_OTHER)
Admission: EM | Admit: 2021-04-24 | Discharge: 2021-04-24 | Disposition: A | Discharge status: Home / Self Care | Payer: 59 | Attending: Emergency Medicine | Admitting: Emergency Medicine

## 2021-04-24 ENCOUNTER — Encounter (HOSPITAL_BASED_OUTPATIENT_CLINIC_OR_DEPARTMENT_OTHER): Payer: Self-pay

## 2021-04-24 ENCOUNTER — Other Ambulatory Visit: Payer: Self-pay

## 2021-04-24 ENCOUNTER — Emergency Department (HOSPITAL_BASED_OUTPATIENT_CLINIC_OR_DEPARTMENT_OTHER): Payer: 59

## 2021-04-24 DIAGNOSIS — Z7984 Long term (current) use of oral hypoglycemic drugs: Secondary | ICD-10-CM | POA: Insufficient documentation

## 2021-04-24 DIAGNOSIS — Z8546 Personal history of malignant neoplasm of prostate: Secondary | ICD-10-CM | POA: Diagnosis not present

## 2021-04-24 DIAGNOSIS — I10 Essential (primary) hypertension: Secondary | ICD-10-CM | POA: Diagnosis not present

## 2021-04-24 DIAGNOSIS — Z87891 Personal history of nicotine dependence: Secondary | ICD-10-CM | POA: Insufficient documentation

## 2021-04-24 DIAGNOSIS — R091 Pleurisy: Secondary | ICD-10-CM

## 2021-04-24 DIAGNOSIS — Z79899 Other long term (current) drug therapy: Secondary | ICD-10-CM | POA: Insufficient documentation

## 2021-04-24 DIAGNOSIS — Z7982 Long term (current) use of aspirin: Secondary | ICD-10-CM | POA: Insufficient documentation

## 2021-04-24 DIAGNOSIS — I2510 Atherosclerotic heart disease of native coronary artery without angina pectoris: Secondary | ICD-10-CM | POA: Insufficient documentation

## 2021-04-24 DIAGNOSIS — Z955 Presence of coronary angioplasty implant and graft: Secondary | ICD-10-CM | POA: Diagnosis not present

## 2021-04-24 DIAGNOSIS — U071 COVID-19: Secondary | ICD-10-CM | POA: Insufficient documentation

## 2021-04-24 DIAGNOSIS — E119 Type 2 diabetes mellitus without complications: Secondary | ICD-10-CM | POA: Insufficient documentation

## 2021-04-24 DIAGNOSIS — R0602 Shortness of breath: Secondary | ICD-10-CM | POA: Diagnosis present

## 2021-04-24 NOTE — ED Provider Notes (Signed)
[facility] EMERGENCY DEPT Provider Note   CSN: [id number] Arrival date & time: 04/24/21  2015     History Chief Complaint  Patient presents with   Shortness of Breath   Chest Pain    Dylan [name] is a 55 y.o. male.  Patient is a 55 year old male with a history of coronary artery disease, DM2, HTN, CVA, recent diagnosis of COVID-19 1 week ago presenting to the emergency department with worsening shortness of breath and dry cough.  The patient adamantly denies any chest pain.  He states he has had no chest pain at all today or in the past few days.  He denies any exertional chest discomfort.  He states that he has been managing his COVID symptoms at home and has had good oral intake.  He presented to the emergency department today solely because he checked his pulse oximetry at home and it was found to be 86%.  He is not sure if the waveform was accurate or not.  He denies any worsening dyspnea at this time.  Past Medical History:  Diagnosis Date   Arthritis    CAD (coronary artery disease) 07/2010; 07/2012; 08/2014   cardiologist--- dr croitoru;  1) 11-28-'11: NSTEMI - 100% RCA - PCI (3 Taxus ION DES 3.0 x 28, 3.0 x 24 & 3.5 x 12 distal-prox); b) 12/ 2013 ISR of RCA stent - -AnngioSculpt PTCA; c) NSTEMI 1/'16: mild ISR with Thrombosis of RCA Stents (in setting of Mesenteric Vein Thrombosis) - Cutting Balloon PTCA; c. Re-look Cath 07/2015 & 07/2016: ~15% ISR in RCA,20% p-mLAD & o-pCx.   Chronic gout    01-07-2021  last flare-up approxl 12-17-2020 bilteral great toes   DM type 2 (diabetes mellitus, type 2) ([place])    followed by pcp   (01-07-2021 per pt does not check blood sugar at home)   Edema of both lower extremities    01-07-2021  pt states wears compression hose   Essential hypertension    followed by pcp and cardiology   History of concussion    01-07-2021  per pt x4 as child, last one age 45 without residual   History of CVA with residual deficit 95/18/8416    embolic left MCA infarct post PCI stenting approx one hour  (01-07-2021 per pt has residual minimal intermittant aphasia )   History of non-ST elevation myocardial infarction (NSTEMI) 07-01-2010  and 01/ 2016   History of prostatitis    History of resection of small bowel 08/28/2014   small bowel perforation,  s/p resction    History of retinal tear 04/2013   right micro-rupture , no occlusion, retinal artery branch w/ vision loss,  per imaging no infarct/ occlusion   History of thrombosis 08/2014   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, provoked by testosterone injection,  08-09-2014 s/p thrombolytic lysis VIR    Hyperlipidemia with target LDL less than 70 09/01/2014   Nocturia    PAC (premature atrial contraction)    pt hx palpitations, event monitor 08-18-2017  showed NS w/ occasional PACs, rare PVCs   Prostate cancer [facility]) urologist-- dr herrick/  oncologist-- dr Tammi Klippel   dx 09-04-2020,  Stage T1c, Gleason 4+3    S/P drug eluting coronary stent placement 07/01/2010   DES x3  to prox and distal RCA   S/P percutaneous transluminal coronary angioplasty 12/ 2013 and 01/ 2016   for ISR  RCA    Patient Active Problem List   Diagnosis Date Noted  Malignant neoplasm of prostate ([place]) 10/12/2020   Elevated PSA 07/16/2020   Non-insulin dependent type 2 diabetes mellitus ([place]) 06/07/2017   Coronary artery disease involving native coronary artery of native heart    Chest pain on exertion 06/06/2017   Medication management 99/37/1696   Diastolic dysfunction 78/93/8101   Finding of multiple premature atrial contractions by electrocardiography 10/05/2015   Iron deficiency anemia 09/29/2014   Obesity (BMI 30-39.9) 09/09/2014   Perforation bowel - 2/2 Mesenteric Ischemia from Mesenteric Vein Thrombosis.    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 04/19/2013   Possible Retinal artery branch occlusion of right eye = POST CATHETERIZATION -- by dilated Eye exam - no evidence of embolic occlusoin; possible microperforation. NOT CVA 04/19/2013    Class: Acute   Hypertriglyceridemia - TG >500 04/19/2013   Dyslipidemia, statin intol 07/07/2012   Essential hypertension 07/07/2012   Smoker, quit 06/30/10 07/07/2012   CAD S/P percutaneous coronary angioplasty 07/06/2012   H/O: CVA (cerebrovascular accident) 07/06/2012   NSTEMI (non-ST elevated myocardial infarction) - 07/2010, 2013, 08/2014 07/04/2010    Past Surgical History:  Procedure Laterality Date   BOWEL RESECTION N/A 08/28/2014   Procedure: SMALL BOWEL RESECTION;  Surgeon: [name], MD;  Location: [place];  Service: General;  Laterality: N/A;   CARDIAC CATHETERIZATION N/A 07/20/2015   Procedure: Left Heart Cath and Coronary Angiography;  Surgeon: [name], MD;  Location: [place] CV LAB;  Service: Cardiovascular;  15% ISR in the RCA stent segment, 20% proximal-mid LAD as well as ostial and proximal circumflex. Normal LV function.   CARDIAC CATHETERIZATION N/A 07/23/2016   Procedure: Left Heart Cath and Coronary Angiography;  Surgeon: [name], MD;  Location: [place] CV LAB;  Service: Cardiovascular: E 55-65%. ~15% diffuse RCA ISR, ~20% diffuse pLAD & pCx   CORONARY ANGIOPLASTY  07/06/2012; 08/15/2014   a) 12/'13: PTCA of 80% ISR mRCA - AngioSculpt; b) PTCA of  ISR/thrombosis   CORONARY ANGIOPLASTY WITH STENT PLACEMENT  07-01-2010  dr harding @[facility]    inferior wall MI - 3 Taxus Ion DES (3.0x52mm, 3.0x78mm, 3.5x73mm) to prox and mid RCA   CYSTOSCOPY N/A 01/10/2021   Procedure: CYSTOSCOPY FLEXIBLE;  Surgeon: Ardis Hughs, MD;  Location[facility];  Service: Urology;  Laterality: N/A;   INCISIONAL HERNIA REPAIR N/A 10/15/2017   Procedure: HERNIA REPAIR INCISIONAL;  Surgeon: [name], MD;  Location: [facility];  Service: General;  Laterality: N/A;   INGUINAL HERNIA REPAIR Bilateral 05/2010   INSERTION OF MESH N/A 10/15/2017   Procedure: INSERTION OF MESH;  Surgeon: [name], MD;  Location: [facility];  Service: General;  Laterality: N/A;   KNEE ARTHROSCOPY W/ MENISCAL REPAIR Left 06/2011   LAPAROSCOPIC APPENDECTOMY N/A 08/28/2014   Procedure: APPENDECTOMY LAPAROSCOPIC;  Surgeon: [name], MD;  Location: [place];  Service: General;  Laterality: N/A;   LAPAROSCOPY  08/28/2014   Procedure: LAPAROSCOPY DIAGNOSTIC;  Surgeon: [name], MD;  Location: [place];  Service: General;;   LEFT HEART CATHETERIZATION WITH CORONARY ANGIOGRAM N/A 06/11/2012   Procedure: LEFT HEART CATHETERIZATION WITH CORONARY ANGIOGRAM;  Surgeon: [name], MD;  Location:  [place] CATH LAB;  Service: Cardiovascular;  Laterality: N/A;   LEFT HEART CATHETERIZATION WITH CORONARY ANGIOGRAM N/A 04/18/2013   Procedure: LEFT HEART CATHETERIZATION WITH CORONARY ANGIOGRAM;  Surgeon: [name], MD;  Location: [facility] CATH LAB;  Service: Cardiovascular;  Laterality: N/A;   LEFT HEART CATHETERIZATION WITH CORONARY ANGIOGRAM N/A 08/15/2014   Procedure: LEFT HEART CATHETERIZATION WITH CORONARY ANGIOGRAM;  Surgeon: [name], MD;  Location: [facility] CATH LAB;  Service: Cardiovascular;  Laterality: N/A;   PERCUTANEOUS CORONARY STENT INTERVENTION (PCI-S) N/A 07/06/2012   Procedure: PERCUTANEOUS CORONARY STENT INTERVENTION (PCI-S);  Surgeon: [name], MD;  Location: [facility] CATH LAB;  Service: Cardiovascular;  Laterality: N/A;   RADIOACTIVE SEED IMPLANT N/A 01/10/2021   Procedure: RADIOACTIVE SEED IMPLANT/BRACHYTHERAPY IMPLANT;  Surgeon: Ardis Hughs, MD;  Location: [facility];  Service: Urology;  Laterality: N/A;   SEPTOPLASTY   11-01-2001 @ [place]   SPACE OAR INSTILLATION N/A 01/10/2021   Procedure: SPACE OAR INSTILLATION;  Surgeon: Ardis Hughs, MD;  Location: [facility];  Service: Urology;  Laterality: N/A;   TONSILLECTOMY AND ADENOIDECTOMY  age 63   TRANSTHORACIC ECHOCARDIOGRAM  08/15/2014   Moderate concentric LVH. EF 60-65% with no regional WMA. Gr 1 DD, otherwise normal       Family History  Problem Relation Age of Onset   Atrial fibrillation Mother    Mitral valve prolapse Mother    Coronary artery disease Father        CABG, aortic aneursym,   Heart attack Father    Heart attack Paternal Uncle    Heart attack Paternal Grandfather     Social History   Tobacco Use   Smoking status: Former    Packs/day: 1.00    Years: 15.00    Pack years: 15.00    Types: Cigarettes    Quit date: 06/30/2010    Years since quitting: 10.8   Smokeless tobacco: Current    Types: Snuff  Vaping Use   Vaping Use: Never used  Substance Use Topics   Alcohol use: Yes    Alcohol/week: 7.0 - 14.0 standard drinks    Types: 7 - 14 Shots of liquor per week    Comment: 1-2 bourbon on the rocks per day   Drug use: No    Home Medications Prior to Admission medications   Medication Sig Start Date End Date Taking? Authorizing Provider  aspirin EC 81 MG tablet Take 81 mg by mouth daily. Swallow whole.    [provider]  carvedilol (COREG) 12.5 MG tablet Take 1 tablet (12.5 mg total) by mouth 2 (two) times daily. 08/17/20 04/16/21  Croitoru, Mihai, MD  Cholecalciferol (VITAMIN D3 PO) Take 1 capsule by mouth daily.    [provider]  Empagliflozin-metFORMIN HCl ER (SYNJARDY XR) 25-1000 MG TB24 Take 1 tablet by mouth daily.    [provider]  Evolocumab (REPATHA SURECLICK) 381 MG/ML SOAJ Inject 140 mg into the skin every 14 (fourteen) days. 01/15/21   Kroeger, Lorelee Cover., PA-C  furosemide (LASIX) 40 MG tablet TAKE 2 TABLETS BY MOUTH EVERY MORNING AND 1 TABLET IN THE EVENING 09/25/20    Croitoru, Mihai, MD  nitroGLYCERIN (NITROSTAT) 0.4 MG SL tablet Place 1 tablet (0.4 mg total) under the tongue every 5 (five) minutes x 3 doses as needed for chest pain. 05/25/19   Kroeger, Lorelee Cover., PA-C  olmesartan (BENICAR) 40 MG tablet Take 1 tablet (40 mg total) by mouth daily. 04/12/21   Croitoru, Dani Gobble, MD  Potassium 99 MG TABS Take 1 tablet by mouth daily.    [provider]  Propylene Glycol (SYSTANE COMPLETE) 0.6 % SOLN Apply 1 drop to eye daily. Patient not taking: Reported on 04/16/2021    [provider]  tamsulosin (FLOMAX) 0.4 MG CAPS capsule Take 1 capsule (0.4 mg total) by mouth daily. Patient not taking: Reported on 04/16/2021 01/10/21   Ardis Hughs, MD  traMADol (ULTRAM) 50 MG tablet Take 1-2 tablets (50-100 mg total) by mouth every 6 (six) hours as needed for moderate pain. 01/10/21   Ardis Hughs, MD    Allergies    Crestor [rosuvastatin], Zofran Alvis Lemmings hcl], Ace inhibitors, Lipitor [atorvastatin], and Codeine  Review of Systems   Review of Systems  Constitutional:  Negative for chills and fever.  HENT:  Negative for sore throat.   Respiratory:  Positive for cough and shortness of breath.   Cardiovascular:  Negative for chest pain and palpitations.  Gastrointestinal:  Negative for abdominal pain and vomiting.  Skin:  Negative for color change and rash.  All other systems reviewed and are negative.  Physical Exam Updated Vital Signs BP (!) 157/98   Pulse 83   Temp 98.3 F (36.8 C) (Oral)   Resp (!) 28   Ht 6\' 1"  (1.854 m)   Wt 113.4 kg   SpO2 94%   BMI 32.98 kg/m   Physical Exam Vitals and nursing note reviewed.  Constitutional:      General: He is not in acute distress. HENT:     Head: Normocephalic and atraumatic.  Eyes:     Conjunctiva/sclera: Conjunctivae normal.     Pupils: Pupils are equal, round, and reactive to light.  Cardiovascular:     Rate and Rhythm: Normal rate and regular rhythm.  Pulmonary:      Effort: Pulmonary effort is normal. No respiratory distress.     Breath sounds: Normal breath sounds.  Abdominal:     General: There is no distension.     Tenderness: There is no guarding.  Musculoskeletal:        General: No deformity or signs of injury.     Cervical back: Normal range of motion and neck supple.  Skin:    Findings: No lesion or rash.  Neurological:     General: No focal deficit present.     Mental Status: He is alert. Mental status is at baseline.    ED Results / Procedures / Treatments   Labs (all labs ordered are listed, but only abnormal results are displayed) Labs Reviewed - No data to display  EKG EKG Interpretation  Date/Time:  Wednesday April 24 2021 20:28:33 EDT Ventricular Rate:  92 PR Interval:  152 QRS Duration: 86 QT Interval:  370 QTC Calculation: 457 R Axis:   39 Text Interpretation: Normal sinus rhythm Anterior infarct , age undetermined Abnormal ECG Confirmed by Regan Lemming ([id number]) on 04/24/2021 11:50:30 PM  Radiology DG Chest Portable 1 View  Result Date: 04/24/2021 CLINICAL DATA:  Shortness of breath and COVID-19 positivity, initial encounter EXAM: PORTABLE CHEST 1 VIEW COMPARISON:  12/13/2020 FINDINGS: Cardiac shadow is within normal limits. The lungs are well aerated bilaterally. No focal infiltrate or effusion is seen. Mild interstitial markings are noted in the left lung which may be related to the underlying viral etiology. No bony abnormality is seen. IMPRESSION: Mild interstitial markings in the left mid to lower lung. Electronically Signed   By: Inez Catalina M.D.   On: 04/24/2021 23:01  Procedures Procedures   Medications Ordered in ED Medications - No data to display  ED Course  I have reviewed the triage vital signs and the nursing notes.  Pertinent labs & imaging results that were available during my care of the patient were reviewed by me and considered in my medical decision making (see chart for details).    MDM  Rules/Calculators/A&P                           55 year old male presenting to the emergency department with recent diagnosis of COVID-19 with concern for hypoxia on pulse oximetry at home to 86%.  No clear worsening of symptoms. Mildly pleuritic cough over the past week, consistent with COVID infection. Patient feels that he is managing symptoms overall well at home.  He completely denies any chest pain at this time and is refusing any further cardiac work-up beyond EKG and chest x-ray.  EKG without ischemic changes, chest x-ray with no focal consolidation to suggest bacterial pneumonia.  He was ambulatory in the emergency department with pulse oximetry remaining 96% on room air.  He is requesting discharge at this time.  Given his lack of symptoms and reassuring medical screening exam, I feel that discharge is appropriate.  Return precautions provided.  Final Clinical Impression(s) / ED Diagnoses Final diagnoses:  [id number]  Pleurisy    Rx / DC Orders ED Discharge Orders     None        Regan Lemming, MD 04/25/21 1256

## 2021-04-24 NOTE — ED Notes (Signed)
Pt refused ambulation with pulse ox. Did not feel it was necessary.

## 2021-04-24 NOTE — ED Notes (Signed)
Ambulated patient with pulse ox. HR 99 ; 02 stats 96%

## 2021-04-24 NOTE — ED Triage Notes (Signed)
Pt reports chest tightness and shortness of breath that started today -states he was tested covid + Tuesday last week   States his sat  was down to 86%..  pt came in ambulatory - no signs of distress at this time.

## 2021-04-26 ENCOUNTER — Encounter (HOSPITAL_BASED_OUTPATIENT_CLINIC_OR_DEPARTMENT_OTHER): Payer: Self-pay

## 2021-04-26 ENCOUNTER — Emergency Department (HOSPITAL_BASED_OUTPATIENT_CLINIC_OR_DEPARTMENT_OTHER): Payer: 59

## 2021-04-26 ENCOUNTER — Other Ambulatory Visit: Payer: Self-pay

## 2021-04-26 ENCOUNTER — Emergency Department (HOSPITAL_COMMUNITY)
Admission: EM | Admit: 2021-04-26 | Discharge: 2021-04-27 | Disposition: A | Discharge status: Home / Self Care | Payer: 59 | Source: Home / Self Care | Attending: Emergency Medicine | Admitting: Emergency Medicine

## 2021-04-26 DIAGNOSIS — I824Y2 Acute embolism and thrombosis of unspecified deep veins of left proximal lower extremity: Secondary | ICD-10-CM | POA: Insufficient documentation

## 2021-04-26 DIAGNOSIS — Z8546 Personal history of malignant neoplasm of prostate: Secondary | ICD-10-CM | POA: Insufficient documentation

## 2021-04-26 DIAGNOSIS — Z7901 Long term (current) use of anticoagulants: Secondary | ICD-10-CM | POA: Diagnosis not present

## 2021-04-26 DIAGNOSIS — E119 Type 2 diabetes mellitus without complications: Secondary | ICD-10-CM | POA: Insufficient documentation

## 2021-04-26 DIAGNOSIS — Z7984 Long term (current) use of oral hypoglycemic drugs: Secondary | ICD-10-CM | POA: Insufficient documentation

## 2021-04-26 DIAGNOSIS — I10 Essential (primary) hypertension: Secondary | ICD-10-CM | POA: Insufficient documentation

## 2021-04-26 DIAGNOSIS — I82432 Acute embolism and thrombosis of left popliteal vein: Secondary | ICD-10-CM | POA: Diagnosis not present

## 2021-04-26 DIAGNOSIS — I82422 Acute embolism and thrombosis of left iliac vein: Secondary | ICD-10-CM | POA: Diagnosis not present

## 2021-04-26 DIAGNOSIS — Z9221 Personal history of antineoplastic chemotherapy: Secondary | ICD-10-CM | POA: Diagnosis not present

## 2021-04-26 DIAGNOSIS — Z8249 Family history of ischemic heart disease and other diseases of the circulatory system: Secondary | ICD-10-CM | POA: Diagnosis not present

## 2021-04-26 DIAGNOSIS — I82402 Acute embolism and thrombosis of unspecified deep veins of left lower extremity: Secondary | ICD-10-CM | POA: Diagnosis not present

## 2021-04-26 DIAGNOSIS — I82412 Acute embolism and thrombosis of left femoral vein: Secondary | ICD-10-CM | POA: Diagnosis not present

## 2021-04-26 DIAGNOSIS — I2699 Other pulmonary embolism without acute cor pulmonale: Secondary | ICD-10-CM | POA: Diagnosis not present

## 2021-04-26 DIAGNOSIS — I871 Compression of vein: Secondary | ICD-10-CM | POA: Diagnosis not present

## 2021-04-26 DIAGNOSIS — Z8616 Personal history of COVID-19: Secondary | ICD-10-CM | POA: Diagnosis not present

## 2021-04-26 DIAGNOSIS — M1A9XX0 Chronic gout, unspecified, without tophus (tophi): Secondary | ICD-10-CM | POA: Diagnosis not present

## 2021-04-26 DIAGNOSIS — Z87891 Personal history of nicotine dependence: Secondary | ICD-10-CM | POA: Insufficient documentation

## 2021-04-26 DIAGNOSIS — U071 COVID-19: Secondary | ICD-10-CM | POA: Insufficient documentation

## 2021-04-26 DIAGNOSIS — E876 Hypokalemia: Secondary | ICD-10-CM | POA: Diagnosis not present

## 2021-04-26 DIAGNOSIS — J95821 Acute postprocedural respiratory failure: Secondary | ICD-10-CM | POA: Diagnosis not present

## 2021-04-26 DIAGNOSIS — Z7982 Long term (current) use of aspirin: Secondary | ICD-10-CM | POA: Insufficient documentation

## 2021-04-26 DIAGNOSIS — Z79899 Other long term (current) drug therapy: Secondary | ICD-10-CM | POA: Insufficient documentation

## 2021-04-26 DIAGNOSIS — I69320 Aphasia following cerebral infarction: Secondary | ICD-10-CM | POA: Diagnosis not present

## 2021-04-26 DIAGNOSIS — E669 Obesity, unspecified: Secondary | ICD-10-CM | POA: Diagnosis not present

## 2021-04-26 DIAGNOSIS — I2510 Atherosclerotic heart disease of native coronary artery without angina pectoris: Secondary | ICD-10-CM | POA: Insufficient documentation

## 2021-04-26 DIAGNOSIS — E785 Hyperlipidemia, unspecified: Secondary | ICD-10-CM | POA: Diagnosis not present

## 2021-04-26 DIAGNOSIS — M1990 Unspecified osteoarthritis, unspecified site: Secondary | ICD-10-CM | POA: Diagnosis not present

## 2021-04-26 DIAGNOSIS — Y838 Other surgical procedures as the cause of abnormal reaction of the patient, or of later complication, without mention of misadventure at the time of the procedure: Secondary | ICD-10-CM | POA: Diagnosis not present

## 2021-04-26 DIAGNOSIS — Z6832 Body mass index (BMI) 32.0-32.9, adult: Secondary | ICD-10-CM | POA: Diagnosis not present

## 2021-04-26 DIAGNOSIS — J9588 Other intraoperative complications of respiratory system, not elsewhere classified: Secondary | ICD-10-CM | POA: Diagnosis not present

## 2021-04-26 DIAGNOSIS — I252 Old myocardial infarction: Secondary | ICD-10-CM | POA: Diagnosis not present

## 2021-04-26 DIAGNOSIS — Z87820 Personal history of traumatic brain injury: Secondary | ICD-10-CM | POA: Diagnosis not present

## 2021-04-26 LAB — RESP PANEL BY RT-PCR (FLU A&B, COVID) ARPGX2
Influenza A by PCR: NEGATIVE
Influenza B by PCR: NEGATIVE
SARS Coronavirus 2 by RT PCR: POSITIVE — AB

## 2021-04-26 LAB — PROTIME-INR
INR: 1.1 (ref 0.8–1.2)
Prothrombin Time: 14.3 seconds (ref 11.4–15.2)

## 2021-04-26 LAB — CBC WITH DIFFERENTIAL/PLATELET
Abs Immature Granulocytes: 0.06 10*3/uL (ref 0.00–0.07)
Basophils Absolute: 0 10*3/uL (ref 0.0–0.1)
Basophils Relative: 0 %
Eosinophils Absolute: 0.1 10*3/uL (ref 0.0–0.5)
Eosinophils Relative: 1 %
HCT: 38 % — ABNORMAL LOW (ref 39.0–52.0)
Hemoglobin: 13.1 g/dL (ref 13.0–17.0)
Immature Granulocytes: 1 %
Lymphocytes Relative: 15 %
Lymphs Abs: 0.7 10*3/uL (ref 0.7–4.0)
MCH: 31.8 pg (ref 26.0–34.0)
MCHC: 34.5 g/dL (ref 30.0–36.0)
MCV: 92.2 fL (ref 80.0–100.0)
Monocytes Absolute: 0.7 10*3/uL (ref 0.1–1.0)
Monocytes Relative: 15 %
Neutro Abs: 3.1 10*3/uL (ref 1.7–7.7)
Neutrophils Relative %: 68 %
Platelets: 174 10*3/uL (ref 150–400)
RBC: 4.12 MIL/uL — ABNORMAL LOW (ref 4.22–5.81)
RDW: 13.5 % (ref 11.5–15.5)
WBC: 4.7 10*3/uL (ref 4.0–10.5)
nRBC: 0 % (ref 0.0–0.2)

## 2021-04-26 LAB — BASIC METABOLIC PANEL
Anion gap: 11 (ref 5–15)
BUN: 20 mg/dL (ref 6–20)
CO2: 31 mmol/L (ref 22–32)
Calcium: 9.6 mg/dL (ref 8.9–10.3)
Chloride: 97 mmol/L — ABNORMAL LOW (ref 98–111)
Creatinine, Ser: 0.92 mg/dL (ref 0.61–1.24)
GFR, Estimated: >60 mL/min (ref >60)
Glucose, Bld: 104 mg/dL — ABNORMAL HIGH (ref 70–99)
Potassium: 3.1 mmol/L — ABNORMAL LOW (ref 3.5–5.1)
Sodium: 139 mmol/L (ref 135–145)

## 2021-04-26 LAB — APTT: aPTT: 28 seconds (ref 24–36)

## 2021-04-26 MED ORDER — RIVAROXABAN 15 MG PO TABS
15.0000 mg | ORAL_TABLET | Freq: Once | ORAL | Status: DC
  Filled 2021-04-26: qty 1

## 2021-04-26 MED ORDER — RIVAROXABAN (XARELTO) VTE STARTER PACK (15 & 20 MG)
ORAL_TABLET | ORAL | 0 refills | Status: DC
  Filled 2021-04-27: qty 51, 30d supply, fill #0

## 2021-04-26 MED ORDER — HEPARIN (PORCINE) 25000 UT/250ML-% IV SOLN
1750.0000 [IU]/h | INTRAVENOUS | Status: DC
  Administered 2021-04-26: 1750 [IU]/h via INTRAVENOUS
  Filled 2021-04-26: qty 250

## 2021-04-26 MED ORDER — HEPARIN BOLUS VIA INFUSION
6000.0000 [IU] | Freq: Once | INTRAVENOUS | Status: AC
  Administered 2021-04-26: 6000 [IU] via INTRAVENOUS

## 2021-04-26 NOTE — ED Notes (Signed)
Handoff report given to [place] at [facility] ED

## 2021-04-26 NOTE — ED Notes (Signed)
Called [facility] to transport patient to [facility] Emergency Dept--[place] accepting

## 2021-04-26 NOTE — ED Notes (Signed)
[facility] at bedside 

## 2021-04-26 NOTE — ED Notes (Signed)
Handoff report given to [facility] 

## 2021-04-26 NOTE — Progress Notes (Signed)
ANTICOAGULATION CONSULT NOTE - Initial Consult  Pharmacy Consult for heparin Indication: DVT  Allergies  Allergen Reactions   Crestor [Rosuvastatin] Other (See Comments)    Severe muscle pain and nausea   Zofran [Ondansetron Hcl] Nausea And Vomiting   Ace Inhibitors Cough   Lipitor [Atorvastatin] Other (See Comments)    myalgia   Codeine Itching    Patient Measurements:   Heparin Dosing Weight: 103.9 kg  Vital Signs: Temp: 98.2 F (36.8 C) (09/23 1642) BP: 124/64 (09/23 2000) Pulse Rate: 76 (09/23 2000)  Labs: No results for input(s): HGB, HCT, PLT, APTT, LABPROT, INR, HEPARINUNFRC, HEPRLOWMOCWT, CREATININE, CKTOTAL, CKMB, TROPONINIHS in the last 72 hours.  CrCl cannot be calculated (Patient's most recent lab result is older than the maximum 21 days allowed.).  Medical History: Past Medical History:  Diagnosis Date   Arthritis    CAD (coronary artery disease) 07/2010; 07/2012; 08/2014   cardiologist--- dr croitoru;  1) 11-28-'11: NSTEMI - 100% RCA - PCI (3 Taxus ION DES 3.0 x 28, 3.0 x 24 & 3.5 x 12 distal-prox); b) 12/ 2013 ISR of RCA stent - -AnngioSculpt PTCA; c) NSTEMI 1/'16: mild ISR with Thrombosis of RCA Stents (in setting of Mesenteric Vein Thrombosis) - Cutting Balloon PTCA; c. Re-look Cath 07/2015 & 07/2016: ~15% ISR in RCA,20% p-mLAD & o-pCx.   Chronic gout    01-07-2021  last flare-up approxl 12-17-2020 bilteral great toes   DM type 2 (diabetes mellitus, type 2) ([place])    followed by pcp   (01-07-2021 per pt does not check blood sugar at home)   Edema of both lower extremities    01-07-2021  pt states wears compression hose   Essential hypertension    followed by pcp and cardiology   History of concussion    01-07-2021  per pt x4 as child, last one age 36 without residual   History of CVA with residual deficit 68/34/1962   embolic left MCA infarct post PCI stenting approx one hour  (01-07-2021 per pt has residual minimal intermittant aphasia )   History of  non-ST elevation myocardial infarction (NSTEMI) 07-01-2010  and 01/ 2016   History of prostatitis    History of resection of small bowel 08/28/2014   small bowel perforation,  s/p resction    History of retinal tear 04/2013   right micro-rupture , no occlusion, retinal artery branch w/ vision loss,  per imaging no infarct/ occlusion   History of thrombosis 08/2014   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, provoked by testosterone injection,  08-09-2014 s/p thrombolytic lysis VIR    Hyperlipidemia with target LDL less than 70 09/01/2014   Nocturia    PAC (premature atrial contraction)    pt hx palpitations, event monitor 08-18-2017  showed NS w/ occasional PACs, rare PVCs   Prostate cancer [facility]) urologist-- dr herrick/  oncologist-- dr Tammi Klippel   dx 09-04-2020,  Stage T1c, Gleason 4+3    S/P drug eluting coronary stent placement 07/01/2010   DES x3  to prox and distal RCA   S/P percutaneous transluminal coronary angioplasty 12/ 2013 and 01/ 2016   for ISR  RCA    Medications:  Infusions:   Assessment: 55 yo presents with L calf pain/swelling and confirmed DVT on doppler. Not on anticoagulation PTA. Pharmacy consulted for heparin dosing.  CBC not drawn.  Goal of Therapy:  Heparin level 0.3-0.7 units/ml Monitor platelets by anticoagulation protocol: Yes   Plan:  Give heparin bolus 6000 units x1  Start heparin infusion 1750 units/hr 6 hour heparin level Daily CBC, heparin level Monitor for s/sx of bleeding F/u transition to [place], PharmD PGY1 Pharmacy Resident 04/26/2021  8:11 PM  Please check [email or web address] for unit-specific pharmacy phone numbers.

## 2021-04-26 NOTE — Consult Note (Signed)
Hospital Consult    Reason for Consult:  left lower extremity dvt Referring Physician:  Dr. Vanita Panda MRN #:  [id number]  History of Present Illness: This is a 55 y.o. male history of portal venous thrombosis 2016 treated with mechanical thrombectomy ultimately placed on Pradaxa with aspirin and Plavix.  This was complicated with bowel perforation requiring small bowel resection.  More recently he has had prostatic cancer is undergone placement of beads is waiting on PSA recheck.  He also has recent COVID 19.  For the past 3 days he has had swelling in the left lower extremity from behind the knee and down towards the ankle.  He does have chronic swelling of the left ankle after standing for many hours more so than the right side.  He also has darkened skin changes on the left not the right.  He does not have any frank history of DVT.  States that he is up-to-date on his colonoscopy.  No family history of DVT.  Past Medical History:  Diagnosis Date   Arthritis    CAD (coronary artery disease) 07/2010; 07/2012; 08/2014   cardiologist--- dr croitoru;  1) 11-28-'11: NSTEMI - 100% RCA - PCI (3 Taxus ION DES 3.0 x 28, 3.0 x 24 & 3.5 x 12 distal-prox); b) 12/ 2013 ISR of RCA stent - -AnngioSculpt PTCA; c) NSTEMI 1/'16: mild ISR with Thrombosis of RCA Stents (in setting of Mesenteric Vein Thrombosis) - Cutting Balloon PTCA; c. Re-look Cath 07/2015 & 07/2016: ~15% ISR in RCA,20% p-mLAD & o-pCx.   Chronic gout    01-07-2021  last flare-up approxl 12-17-2020 bilteral great toes   DM type 2 (diabetes mellitus, type 2) ([place])    followed by pcp   (01-07-2021 per pt does not check blood sugar at home)   Edema of both lower extremities    01-07-2021  pt states wears compression hose   Essential hypertension    followed by pcp and cardiology   History of concussion    01-07-2021  per pt x4 as child, last one age 76 without residual   History of CVA with residual deficit 38/45/3646   embolic left MCA infarct  post PCI stenting approx one hour  (01-07-2021 per pt has residual minimal intermittant aphasia )   History of non-ST elevation myocardial infarction (NSTEMI) 07-01-2010  and 01/ 2016   History of prostatitis    History of resection of small bowel 08/28/2014   small bowel perforation,  s/p resction    History of retinal tear 04/2013   right micro-rupture , no occlusion, retinal artery branch w/ vision loss,  per imaging no infarct/ occlusion   History of thrombosis 08/2014   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, provoked by testosterone injection,  08-09-2014 s/p thrombolytic lysis VIR    Hyperlipidemia with target LDL less than 70 09/01/2014   Nocturia    PAC (premature atrial contraction)    pt hx palpitations, event monitor 08-18-2017  showed NS w/ occasional PACs, rare PVCs   Prostate cancer [facility]) urologist-- dr herrick/  oncologist-- dr Tammi Klippel   dx 09-04-2020,  Stage T1c, Gleason 4+3    S/P drug eluting coronary stent placement 07/01/2010   DES x3  to prox and distal RCA   S/P percutaneous transluminal coronary angioplasty 12/ 2013 and 01/ 2016   for ISR  RCA    Past Surgical History:  Procedure Laterality Date   BOWEL RESECTION N/A 08/28/2014   Procedure: SMALL BOWEL RESECTION;  Surgeon:  [name], MD;  Location: [place];  Service: General;  Laterality: N/A;   CARDIAC CATHETERIZATION N/A 07/20/2015   Procedure: Left Heart Cath and Coronary Angiography;  Surgeon: [name], MD;  Location: [place] [facility];  Service: Cardiovascular;  15% ISR in the RCA stent segment, 20% proximal-mid LAD as well as ostial and proximal circumflex. Normal LV function.   CARDIAC CATHETERIZATION N/A 07/23/2016   Procedure: Left Heart Cath and Coronary Angiography;  Surgeon: [name], MD;  Location: [place] [facility];  Service: Cardiovascular: E 55-65%. ~15% diffuse RCA ISR, ~20% diffuse pLAD & pCx   CORONARY ANGIOPLASTY  07/06/2012; 08/15/2014   a) 12/'13: PTCA of  80% ISR mRCA - AngioSculpt; b) PTCA of  ISR/thrombosis   CORONARY ANGIOPLASTY WITH STENT PLACEMENT  07-01-2010  dr harding @[facility]    inferior wall MI - 3 Taxus Ion DES (3.0x45mm, 3.0x28mm, 3.5x17mm) to prox and mid RCA   CYSTOSCOPY N/A 01/10/2021   Procedure: CYSTOSCOPY FLEXIBLE;  Surgeon: Ardis Hughs, MD;  Location: [facility];  Service: Urology;  Laterality: N/A;   INCISIONAL HERNIA REPAIR N/A 10/15/2017   Procedure: HERNIA REPAIR INCISIONAL;  Surgeon: [name], MD;  Location: [facility];  Service: General;  Laterality: N/A;   INGUINAL HERNIA REPAIR Bilateral 05/2010   INSERTION OF MESH N/A 10/15/2017   Procedure: INSERTION OF MESH;  Surgeon: [name], MD;  Location: [facility];  Service: General;  Laterality: N/A;   KNEE ARTHROSCOPY W/ MENISCAL REPAIR Left 06/2011   LAPAROSCOPIC APPENDECTOMY N/A 08/28/2014   Procedure: APPENDECTOMY LAPAROSCOPIC;  Surgeon: [name], MD;  Location: [place];  Service: General;  Laterality: N/A;   LAPAROSCOPY  08/28/2014   Procedure: LAPAROSCOPY DIAGNOSTIC;  Surgeon: [name], MD;  Location: [place];  Service: General;;   LEFT HEART CATHETERIZATION WITH CORONARY ANGIOGRAM N/A 06/11/2012   Procedure: LEFT HEART CATHETERIZATION WITH CORONARY ANGIOGRAM;  Surgeon: [name], MD;  Location: [place] [facility];  Service: Cardiovascular;  Laterality: N/A;   LEFT HEART CATHETERIZATION WITH CORONARY ANGIOGRAM N/A 04/18/2013   Procedure: LEFT HEART CATHETERIZATION WITH CORONARY ANGIOGRAM;  Surgeon: [name], MD;  Location: [facility] C[facility] LAB;  Service: Cardiovascular;  Laterality: N/A;   LEFT HEART CATHETERIZATION WITH CORONARY ANGIOGRAM N/A 08/15/2014   Procedure: LEFT HEART CATHETERIZATION WITH CORONARY ANGIOGRAM;  Surgeon: [name], MD;  Location: [facility];  Service: Cardiovascular;  Laterality: N/A;   PERCUTANEOUS CORONARY STENT INTERVENTION (PCI-S) N/A 07/06/2012   Procedure: PERCUTANEOUS CORONARY STENT INTERVENTION (PCI-S);   Surgeon: [name], MD;  Location: [facility] CATH LAB;  Service: Cardiovascular;  Laterality: N/A;   RADIOACTIVE SEED IMPLANT N/A 01/10/2021   Procedure: RADIOACTIVE SEED IMPLANT/BRACHYTHERAPY IMPLANT;  Surgeon: Ardis Hughs, MD;  Location: [facility];  Service: Urology;  Laterality: N/A;   SEPTOPLASTY  11-01-2001 @ [place]   SPACE OAR INSTILLATION N/A 01/10/2021   Procedure: SPACE OAR INSTILLATION;  Surgeon: Ardis Hughs, MD;  Location: [facility];  Service: Urology;  Laterality: N/A;   TONSILLECTOMY AND ADENOIDECTOMY  age 39   TRANSTHORACIC ECHOCARDIOGRAM  08/15/2014   Moderate concentric LVH. EF 60-65% with no regional WMA. Gr 1 DD, otherwise normal    Allergies  Allergen Reactions   Crestor [Rosuvastatin] Other (See Comments)    Severe muscle pain and nausea   Zofran [Ondansetron Hcl] Nausea And Vomiting   Ace Inhibitors Cough   Lipitor [Atorvastatin] Other (See Comments)    myalgia   Codeine Itching  Prior to Admission medications   Medication Sig Start Date End Date Taking? Authorizing Provider  aspirin EC 81 MG tablet Take 81 mg by mouth daily. Swallow whole.   Yes [provider]  Cholecalciferol (VITAMIN D3 PO) Take 1 capsule by mouth daily.   Yes [provider]  Empagliflozin-metFORMIN HCl ER (SYNJARDY XR) 25-1000 MG TB24 Take 1 tablet by mouth daily.   Yes [provider]  Evolocumab (REPATHA SURECLICK) 161 MG/ML SOAJ Inject 140 mg into the skin every 14 (fourteen) days. 01/15/21  Yes Kroeger, Daleen Snook M., PA-C  furosemide (LASIX) 40 MG tablet TAKE 2 TABLETS BY MOUTH EVERY MORNING AND 1 TABLET IN THE EVENING 09/25/20  Yes Croitoru, Mihai, MD  olmesartan (BENICAR) 40 MG tablet Take 1 tablet (40 mg total) by mouth daily. 04/12/21  Yes Croitoru, Mihai, MD  traMADol (ULTRAM) 50 MG tablet Take 1-2 tablets (50-100 mg total) by mouth every 6 (six) hours as needed for moderate pain. 01/10/21  Yes Ardis Hughs, MD   carvedilol (COREG) 12.5 MG tablet Take 1 tablet (12.5 mg total) by mouth 2 (two) times daily. 08/17/20 04/16/21  Croitoru, Dani Gobble, MD  nitroGLYCERIN (NITROSTAT) 0.4 MG SL tablet Place 1 tablet (0.4 mg total) under the tongue every 5 (five) minutes x 3 doses as needed for chest pain. 05/25/19   Kroeger, Lorelee Cover., PA-C  Potassium 99 MG TABS Take 1 tablet by mouth daily.    [provider]  Propylene Glycol (SYSTANE COMPLETE) 0.6 % SOLN Apply 1 drop to eye daily. Patient not taking: Reported on 04/16/2021    [provider]  tamsulosin (FLOMAX) 0.4 MG CAPS capsule Take 1 capsule (0.4 mg total) by mouth daily. Patient not taking: Reported on 04/16/2021 01/10/21   Ardis Hughs, MD    Social History   Socioeconomic History   Marital status: Married    Spouse name: Curt Bears   Number of children: 4   Years of education: 12   Highest education level: Not on file  Occupational History   Occupation: Pharmacologist: [employer]  Tobacco Use   Smoking status: Former    Packs/day: 1.00    Years: 15.00    Pack years: 15.00    Types: Cigarettes    Quit date: 06/30/2010    Years since quitting: 10.8   Smokeless tobacco: Current    Types: Snuff  Vaping Use   Vaping Use: Never used  Substance and Sexual Activity   Alcohol use: Yes    Alcohol/week: 7.0 - 14.0 standard drinks    Types: 7 - 14 Shots of liquor per week    Comment: 1-2 bourbon on the rocks per day   Drug use: No   Sexual activity: Yes    Birth control/protection: Surgical    Comment: vasectomy  Other Topics Concern   Not on file  Social History Narrative   Not on file   Social Determinants of Health   Financial Resource Strain: Not on file  Food Insecurity: Not on file  Transportation Needs: Not on file  Physical Activity: Not on file  Stress: Not on file  Social Connections: Not on file  Intimate Partner Violence: Not on file    Family History  Problem Relation Age of Onset   Atrial  fibrillation Mother    Mitral valve prolapse Mother    Coronary artery disease Father        CABG, aortic aneursym,   Heart attack Father    Heart  attack Paternal Uncle    Heart attack Paternal Grandfather     Review of Systems  Constitutional: Negative.   HENT: Negative.    Respiratory: Negative.    Cardiovascular:  Positive for leg swelling.  Genitourinary: Negative.   Musculoskeletal: Negative.   Skin:        Redness left ankle  Neurological: Negative.   Endo/Heme/Allergies: Negative.   Psychiatric/Behavioral: Negative.       Physical Examination  Vitals:   04/26/21 1902 04/26/21 2000  BP: 122/76 124/64  Pulse: 93 76  Resp: 18 18  Temp:    SpO2: 100% 93%   There is no height or weight on file to calculate BMI. Physical Exam HENT:     Head: Normocephalic.     Nose:     Comments: Wearing a mask Eyes:     Pupils: Pupils are equal, round, and reactive to light.  Cardiovascular:     Pulses: Normal pulses.  Pulmonary:     Effort: Pulmonary effort is normal.  Abdominal:     General: Abdomen is flat.     Palpations: Abdomen is soft. There is no mass.  Musculoskeletal:     Cervical back: Normal range of motion.     Right lower leg: No edema.     Left lower leg: Edema present.     Comments: Left thigh is soft and nonedematous  Skin:    Capillary Refill: Capillary refill takes less than 2 seconds.     Comments: Blanching erythema left ankle  Neurological:     General: No focal deficit present.     Mental Status: He is alert.     Comments: Left foot and ankle are sensorimotor intact  Psychiatric:        Mood and Affect: Mood normal.        Behavior: Behavior normal.        Thought Content: Thought content normal.        Judgment: Judgment normal.      CBC    Component Value Date/Time   WBC 4.7 04/26/2021 2020   RBC 4.12 (L) 04/26/2021 2020   HGB 13.1 04/26/2021 2020   HGB 12.6 (L) 10/04/2014 1444   HCT 38.0 (L) 04/26/2021 2020   HCT 38.7 10/04/2014  1444   PLT 174 04/26/2021 2020   PLT 293 10/04/2014 1444   MCV 92.2 04/26/2021 2020   MCV 85.4 10/04/2014 1444   MCH 31.8 04/26/2021 2020   MCHC 34.5 04/26/2021 2020   RDW 13.5 04/26/2021 2020   RDW 14.0 10/04/2014 1444   LYMPHSABS 0.7 04/26/2021 2020   LYMPHSABS 2.6 10/04/2014 1444   MONOABS 0.7 04/26/2021 2020   MONOABS 0.5 10/04/2014 1444   EOSABS 0.1 04/26/2021 2020   EOSABS 0.1 10/04/2014 1444   BASOSABS 0.0 04/26/2021 2020   BASOSABS 0.0 10/04/2014 1444    BMET    Component Value Date/Time   NA 141 01/08/2021 1204   NA 140 09/28/2014 1438   K 3.3 (L) 01/08/2021 1204   K 4.3 09/28/2014 1438   CL 100 01/08/2021 1204   CO2 31 01/08/2021 1204   CO2 25 09/28/2014 1438   GLUCOSE 127 (H) 01/08/2021 1204   GLUCOSE 134 09/28/2014 1438   BUN 16 01/08/2021 1204   BUN 27.4 (H) 10/04/2014 1445   CREATININE 1.14 01/08/2021 1204   CREATININE 1.14 05/29/2016 1157   CREATININE 1.6 (H) 10/04/2014 1445   CALCIUM 9.2 01/08/2021 1204   CALCIUM 9.5 09/28/2014 1438   GFRNONAA >60  01/08/2021 1204   GFRAA >60 01/13/2019 1552    COAGS: Lab Results  Component Value Date   INR 1.1 04/26/2021   INR 1.0 01/08/2021   INR 1.10 07/23/2016     Non-Invasive Vascular Imaging:   Left LOWER EXTREMITY VENOUS DOPPLER ULTRASOUND   TECHNIQUE: Gray-scale sonography with graded compression, as well as color Doppler and duplex ultrasound were performed to evaluate the lower extremity deep venous systems from the level of the common femoral vein and including the common femoral, femoral, profunda femoral, popliteal and calf veins including the posterior tibial, peroneal and gastrocnemius veins when visible. The superficial great saphenous vein was also interrogated. Spectral Doppler was utilized to evaluate flow at rest and with distal augmentation maneuvers in the common femoral, femoral and popliteal veins.   COMPARISON:  None.   FINDINGS: Contralateral Common Femoral Vein: Respiratory  phasicity is normal and symmetric with the symptomatic side. No evidence of thrombus. Normal compressibility.   Common Femoral Vein: Positive for occlusive thrombus. Noncompressible.   Saphenofemoral Junction: No evidence of thrombus. Normal compressibility and flow on color Doppler imaging.   Profunda Femoral Vein: No evidence of thrombus. Normal compressibility and flow on color Doppler imaging.   Femoral Vein: Positive for occlusive thrombus. Noncompressible vein.   Popliteal Vein: Positive for occlusive thrombus. Noncompressible vein.   Calf Veins: Positive for occlusive thrombus within the gastrocnemius vein in the calf. Poorly visible peroneal and posterior tibial veins.   Superficial Great Saphenous Vein: No evidence of thrombus. Normal compressibility.   IMPRESSION: Positive for extensive occlusive DVT involving the left common femoral vein, femoral vein, popliteal vein, and gastrocnemius vein.  ASSESSMENT/PLAN: This is a 55 y.o. male here with extensive left lower extremity DVT risk factors being recent COVID-19 infection and ongoing treatment for prostate cancer.  I discussed these as risk factors and he is also up-to-date on his colonoscopy.  He has history of portal vein thrombosis no other DVT.  He is currently on heparin he has been off his feet for several hours while in the emergency department his swelling has improved he is not overly uncomfortable and he definitely does not have any signs of phlegmasia with palpable pulses and his foot is sensorimotor intact.  I discussed his options being anticoagulation which would need to be at least for 6 months and at least till he is finished with treatment of prostate cancer.  He may be a candidate for hypercoagulable work-up prior to discontinuing anticoagulation given his history of portal venous thrombosis and now history of extensive DVT.  I have also discussed possibility of admitting him placing him on heparin drip and  performing mechanical thrombectomy as soon as Monday.  Patient would rather wait to have this done due to family planning.  I will have him follow-up in 2 weeks in my office to discuss possible mechanical thrombectomy.  He will be placed on Xarelto or Eliquis tonight at the discretion of the emergency department.  I have advised him to call my office sooner if his symptoms worsen.  I will otherwise plan to see him in 2 weeks to discuss mechanical thrombectomy.  At that time we will also obtain IVC iliac duplex.  Jenee Spaugh C. Donzetta Matters, MD [facility] Office: [phone] Pager: [phone]

## 2021-04-26 NOTE — ED Provider Notes (Signed)
[facility] EMERGENCY DEPT Provider Note   CSN: [id number] Arrival date & time: 04/26/21  1629     History Chief Complaint  Patient presents with   Leg Swelling    Dylan [name] is a 55 y.o. 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  Presents to the emergency department with chief complaint of left lower leg swelling and tenderness.  Patient reports that his symptoms started 3 days prior.  Symptoms have been constant since then.  At present patient rates pain 4/10 on the pain scale.  Pain is worse with touch or movement.  Patient has had minimal relief with over-the-counter pain medication.  Patient denies any shortness of breath, hemoptysis, chest pain, numbness, weakness.  HPI     Past Medical History:  Diagnosis Date   Arthritis    CAD (coronary artery disease) 07/2010; 07/2012; 08/2014   cardiologist--- dr croitoru;  1) 11-28-'11: NSTEMI - 100% RCA - PCI (3 Taxus ION DES 3.0 x 28, 3.0 x 24 & 3.5 x 12 distal-prox); b) 12/ 2013 ISR of RCA stent - -AnngioSculpt PTCA; c) NSTEMI 1/'16: mild ISR with Thrombosis of RCA Stents (in setting of Mesenteric Vein Thrombosis) - Cutting Balloon PTCA; c. Re-look Cath 07/2015 & 07/2016: ~15% ISR in RCA,20% p-mLAD & o-pCx.   Chronic gout    01-07-2021  last flare-up approxl 12-17-2020 bilteral great toes   DM type 2 (diabetes mellitus, type 2) ([place])    followed by pcp   (01-07-2021 per pt does not check blood sugar at home)   Edema of both lower extremities    01-07-2021  pt states wears compression hose   Essential hypertension    followed by pcp and cardiology   History of concussion    01-07-2021  per pt x4 as child, last one age 67 without residual   History of CVA with residual deficit 70/62/3762   embolic left MCA infarct post PCI stenting approx one hour  (01-07-2021 per pt has residual minimal  intermittant aphasia )   History of non-ST elevation myocardial infarction (NSTEMI) 07-01-2010  and 01/ 2016   History of prostatitis    History of resection of small bowel 08/28/2014   small bowel perforation,  s/p resction    History of retinal tear 04/2013   right micro-rupture , no occlusion, retinal artery branch w/ vision loss,  per imaging no infarct/ occlusion   History of thrombosis 08/2014   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, provoked by testosterone injection,  08-09-2014 s/p thrombolytic lysis VIR    Hyperlipidemia with target LDL less than 70 09/01/2014   Nocturia    PAC (premature atrial contraction)    pt hx palpitations, event monitor 08-18-2017  showed NS w/ occasional PACs, rare PVCs   Prostate cancer [facility]) urologist-- dr herrick/  oncologist-- dr Tammi Klippel   dx 09-04-2020,  Stage T1c, Gleason 4+3    S/P drug eluting coronary stent placement 07/01/2010   DES x3  to prox and distal RCA   S/P percutaneous transluminal coronary angioplasty 12/ 2013 and 01/ 2016   for ISR  RCA    Patient Active Problem List   Diagnosis Date Noted   Malignant neoplasm of prostate ([place]) 10/12/2020   Elevated PSA 07/16/2020   Non-insulin dependent type 2 diabetes mellitus ([place]) 06/07/2017   Coronary artery disease involving native coronary artery of native heart  Chest pain on exertion 06/06/2017   Medication management 94/70/9628   Diastolic dysfunction 36/62/9476   Finding of multiple premature atrial contractions by electrocardiography 10/05/2015   Iron deficiency anemia 09/29/2014   Obesity (BMI 30-39.9) 09/09/2014   Perforation bowel - 2/2 Mesenteric Ischemia from Mesenteric Vein Thrombosis.    Intestinal perforation ([place]) 08/26/2014   Coronary stent thrombosis 08/15/2014   Acute embolism and thrombosis of vein    Mesenteric vein thrombosis (HCC)    Superior mesenteric vein thrombosis ([place]) 08/05/2014   Portal vein thrombosis 08/05/2014   Visual loss,  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 04/19/2013   Possible Retinal artery branch occlusion of right eye = POST CATHETERIZATION -- by dilated Eye exam - no evidence of embolic occlusoin; possible microperforation. NOT CVA 04/19/2013    Class: Acute   Hypertriglyceridemia - TG >500 04/19/2013   Dyslipidemia, statin intol 07/07/2012   Essential hypertension 07/07/2012   Smoker, quit 06/30/10 07/07/2012   CAD S/P percutaneous coronary angioplasty 07/06/2012   H/O: CVA (cerebrovascular accident) 07/06/2012   NSTEMI (non-ST elevated myocardial infarction) - 07/2010, 2013, 08/2014 07/04/2010    Past Surgical History:  Procedure Laterality Date   BOWEL RESECTION N/A 08/28/2014   Procedure: SMALL BOWEL RESECTION;  Surgeon: [name], MD;  Location: [facility];  Service: General;  Laterality: N/A;   CARDIAC CATHETERIZATION N/A 07/20/2015   Procedure: Left Heart Cath and Coronary Angiography;  Surgeon: Kong Packett M Martinique, MD;  Location: [facility];  Service: Cardiovascular;  15% ISR in the RCA stent segment, 20% proximal-mid LAD as well as ostial and proximal circumflex. Normal LV function.   CARDIAC CATHETERIZATION N/A 07/23/2016   Procedure: Left Heart Cath and Coronary Angiography;  Surgeon: Vanity Larsson M Martinique, MD;  Location: [facility];  Service: Cardiovascular: E 55-65%. ~15% diffuse RCA ISR, ~20% diffuse pLAD & pCx   CORONARY ANGIOPLASTY  07/06/2012; 08/15/2014   a) 12/'13: PTCA of 80% ISR mRCA - AngioSculpt; b) PTCA of  ISR/thrombosis   CORONARY ANGIOPLASTY WITH STENT PLACEMENT  07-01-2010  dr harding @[facility]    inferior wall MI - 3 Taxus Ion DES (3.0x69mm, 3.0x8mm, 3.5x65mm) to prox and mid RCA   CYSTOSCOPY N/A 01/10/2021   Procedure: CYSTOSCOPY FLEXIBLE;  Surgeon: Ardis Hughs, MD;  Location: [facility];  Service: Urology;  Laterality: N/A;   INCISIONAL HERNIA REPAIR N/A 10/15/2017    Procedure: HERNIA REPAIR INCISIONAL;  Surgeon: [name], MD;  Location: [facility];  Service: General;  Laterality: N/A;   INGUINAL HERNIA REPAIR Bilateral 05/2010   INSERTION OF MESH N/A 10/15/2017   Procedure: INSERTION OF MESH;  Surgeon: [name], MD;  Location: [facility];  Service: General;  Laterality: N/A;   KNEE ARTHROSCOPY W/ MENISCAL REPAIR Left 06/2011   LAPAROSCOPIC APPENDECTOMY N/A 08/28/2014   Procedure: APPENDECTOMY LAPAROSCOPIC;  Surgeon: [name], MD;  Location: [facility];  Service: General;  Laterality: N/A;   LAPAROSCOPY  08/28/2014   Procedure: LAPAROSCOPY DIAGNOSTIC;  Surgeon: [name], MD;  Location: [facility];  Service: General;;   LEFT HEART CATHETERIZATION WITH CORONARY ANGIOGRAM N/A 06/11/2012   Procedure: LEFT HEART CATHETERIZATION WITH CORONARY ANGIOGRAM;  Surgeon: [name], MD;  Location: [facility] CATH [facility];  Service: Cardiovascular;  Laterality: N/A;   LEFT HEART CATHETERIZATION WITH CORONARY ANGIOGRAM N/A 04/18/2013   Procedure: LEFT HEART CATHETERIZATION WITH CORONARY ANGIOGRAM;  Surgeon: [name], MD;  Location:  [facility] CATH LAB;  Service: Cardiovascular;  Laterality: N/A;   LEFT HEART CATHETERIZATION WITH CORONARY ANGIOGRAM N/A 08/15/2014   Procedure: LEFT HEART CATHETERIZATION WITH CORONARY ANGIOGRAM;  Surgeon: [name], MD;  Location: [facility] CATH LAB;  Service: Cardiovascular;  Laterality: N/A;   PERCUTANEOUS CORONARY STENT INTERVENTION (PCI-S) N/A 07/06/2012   Procedure: PERCUTANEOUS CORONARY STENT INTERVENTION (PCI-S);  Surgeon: [name], MD;  Location: [facility] CATH LAB;  Service: Cardiovascular;  Laterality: N/A;   RADIOACTIVE SEED IMPLANT N/A 01/10/2021   Procedure: RADIOACTIVE SEED IMPLANT/BRACHYTHERAPY IMPLANT;  Surgeon: Ardis Hughs, MD;  Location: [facility];  Service: Urology;  Laterality: N/A;   SEPTOPLASTY  11-01-2001 @ [facility] [facility]   SPACE OAR INSTILLATION N/A 01/10/2021   Procedure: SPACE OAR INSTILLATION;  Surgeon:  Ardis Hughs, MD;  Location: [facility];  Service: Urology;  Laterality: N/A;   TONSILLECTOMY AND ADENOIDECTOMY  age 57   TRANSTHORACIC ECHOCARDIOGRAM  08/15/2014   Moderate concentric LVH. EF 60-65% with no regional WMA. Gr 1 DD, otherwise normal       Family History  Problem Relation Age of Onset   Atrial fibrillation Mother    Mitral valve prolapse Mother    Coronary artery disease Father        CABG, aortic aneursym,   Heart attack Father    Heart attack Paternal Uncle    Heart attack Paternal Grandfather     Social History   Tobacco Use   Smoking status: Former    Packs/day: 1.00    Years: 15.00    Pack years: 15.00    Types: Cigarettes    Quit date: 06/30/2010    Years since quitting: 10.8   Smokeless tobacco: Current    Types: Snuff  Vaping Use   Vaping Use: Never used  Substance Use Topics   Alcohol use: Yes    Alcohol/week: 7.0 - 14.0 standard drinks    Types: 7 - 14 Shots of liquor per week    Comment: 1-2 bourbon on the rocks per day   Drug use: No    Home Medications Prior to Admission medications   Medication Sig Start Date End Date Taking? Authorizing Provider  aspirin EC 81 MG tablet Take 81 mg by mouth daily. Swallow whole.   Yes [provider]  Cholecalciferol (VITAMIN D3 PO) Take 1 capsule by mouth daily.   Yes [provider]  Empagliflozin-metFORMIN HCl ER (SYNJARDY XR) 25-1000 MG TB24 Take 1 tablet by mouth daily.   Yes [provider]  Evolocumab (REPATHA SURECLICK) 937 MG/ML SOAJ Inject 140 mg into the skin every 14 (fourteen) days. 01/15/21  Yes Kroeger, Daleen Snook M., PA-C  furosemide (LASIX) 40 MG tablet TAKE 2 TABLETS BY MOUTH EVERY MORNING AND 1 TABLET IN THE EVENING 09/25/20  Yes Croitoru, Mihai, MD  olmesartan (BENICAR) 40 MG tablet Take 1 tablet (40 mg total) by mouth daily. 04/12/21  Yes Croitoru, Mihai, MD  traMADol (ULTRAM) 50 MG tablet Take 1-2 tablets (50-100 mg total) by mouth every 6 (six)  hours as needed for moderate pain. 01/10/21  Yes Ardis Hughs, MD  carvedilol (COREG) 12.5 MG tablet Take 1 tablet (12.5 mg total) by mouth 2 (two) times daily. 08/17/20 04/16/21  Croitoru, Dani Gobble, MD  nitroGLYCERIN (NITROSTAT) 0.4 MG SL tablet Place 1 tablet (0.4 mg total) under the tongue every 5 (five) minutes x 3 doses as needed for chest pain. 05/25/19   Kroeger, Lorelee Cover., PA-C  Potassium 99 MG TABS Take  1 tablet by mouth daily.    [provider]  Propylene Glycol (SYSTANE COMPLETE) 0.6 % SOLN Apply 1 drop to eye daily. Patient not taking: Reported on 04/16/2021    [provider]  tamsulosin (FLOMAX) 0.4 MG CAPS capsule Take 1 capsule (0.4 mg total) by mouth daily. Patient not taking: Reported on 04/16/2021 01/10/21   Ardis Hughs, MD    Allergies    Crestor [rosuvastatin], Zofran Alvis Lemmings hcl], Ace inhibitors, Lipitor [atorvastatin], and Codeine  Review of Systems   Review of Systems  Constitutional:  Negative for chills and fever.  Eyes:  Negative for visual disturbance.  Respiratory:  Negative for cough and shortness of breath.   Cardiovascular:  Positive for leg swelling. Negative for chest pain and palpitations.  Gastrointestinal:  Negative for abdominal pain, nausea and vomiting.  Genitourinary:  Negative for difficulty urinating and dysuria.  Musculoskeletal:  Positive for myalgias. Negative for back pain, joint swelling and neck pain.  Skin:  Positive for color change. Negative for pallor, rash and wound.  Neurological:  Negative for dizziness, syncope, light-headedness and headaches.  Psychiatric/Behavioral:  Negative for confusion.    Physical Exam Updated Vital Signs BP 136/79   Pulse 76   Temp 98.2 F (36.8 C)   Resp 18   SpO2 95%   Physical Exam Vitals and nursing note reviewed.  Constitutional:      General: He is not in acute distress.    Appearance: He is not ill-appearing, toxic-appearing or diaphoretic.  HENT:     Head:  Normocephalic.  Eyes:     General: No scleral icterus.       Right eye: No discharge.        Left eye: No discharge.  Cardiovascular:     Rate and Rhythm: Normal rate.     Pulses:          Radial pulses are 2+ on the right side and 2+ on the left side.       Dorsalis pedis pulses are 2+ on the right side and 2+ on the left side.  Pulmonary:     Effort: Pulmonary effort is normal. No tachypnea, bradypnea or respiratory distress.     Breath sounds: Normal breath sounds. No stridor.  Musculoskeletal:     Right lower leg: No swelling, deformity, lacerations, tenderness or bony tenderness. No edema.     Left lower leg: Swelling and tenderness present. No deformity, lacerations or bony tenderness. No edema.     Comments: Patient has swelling to left lower leg from knee to ankle.  Blanchable erythema noted.  Tenderness to the popliteal down to left calf.  Feet:     Right foot:     Skin integrity: Skin integrity normal.     Toenail Condition: Right toenails are normal.     Left foot:     Skin integrity: Skin integrity normal.     Toenail Condition: Left toenails are normal.  Skin:    General: Skin is warm and dry.  Neurological:     General: No focal deficit present.     Mental Status: He is alert.  Psychiatric:        Behavior: Behavior is cooperative.    ED Results / Procedures / Treatments   Labs (all labs ordered are listed, but only abnormal results are displayed) Labs Reviewed - No data to display  EKG None  Radiology DG Chest Portable 1 View  Result Date: 04/24/2021 CLINICAL DATA:  Shortness of breath and  COVID-19 positivity, initial encounter EXAM: PORTABLE CHEST 1 VIEW COMPARISON:  12/13/2020 FINDINGS: Cardiac shadow is within normal limits. The lungs are well aerated bilaterally. No focal infiltrate or effusion is seen. Mild interstitial markings are noted in the left lung which may be related to the underlying viral etiology. No bony abnormality is seen. IMPRESSION:  Mild interstitial markings in the left mid to lower lung. Electronically Signed   By: Inez Catalina M.D.   On: 04/24/2021 23:01    Procedures Procedures   Medications Ordered in ED Medications - No data to display  ED Course  I have reviewed the triage vital signs and the nursing notes.  Pertinent labs & imaging results that were available during my care of the patient were reviewed by me and considered in my medical decision making (see chart for details).  Clinical Course as of 04/26/21 2359  Fri Apr 26, 2021  1957 Contacted by IR who reports that patient has extensive DVT from common femoral vein to gastrocnemius. [PB]  2014 Spoke to vascular surgeon Dr. Donzetta Matters.  We will have patient moved to [facility] ED where vascular team will consult. [PB]  2026 Spoke to Dr. Vanita Panda at [facility] emergency department who agreed to accept the patient for transfer. [PB]    Clinical Course User Index [PB] Dyann Ruddle   MDM Rules/Calculators/A&P                           Alert 55 year old male no acute stress, nontoxic-appearing.  Presents to ED with chief complaint of left lower leg swelling and tenderness.  Patient has history of previous DVT, recently completed chemotherapy for prostate cancer, tested positive for COVID-19 12 days prior.  On exam patient has swelling and blanchable erythema to left lower extremity.  Will obtain ultrasound imaging to evaluate for DVT.  Ultrasound imaging shows DVT from common femoral vein to gastrocnemius.  Due to proximal DVT will consult vascular surgery.  Patient started on heparin.  Spoke with Dr.Cain.  Patient will be moved to [facility] emergency department for vascular surgery evaluation.  Spoke with Dr. Vanita Panda who agrees to set patient for ED to ED transfer.  Patient care and treatment were discussed with attending physician Dr. Almyra Free  Final Clinical Impression(s) / ED Diagnoses Final diagnoses:  Acute deep vein thrombosis (DVT) of  proximal vein of left lower extremity [facility])    Rx / DC Orders ED Discharge Orders     None        Dyann Ruddle 04/27/21 0002    Luna Fuse, MD 05/08/21 1706

## 2021-04-26 NOTE — ED Provider Notes (Signed)
Blood pressure 130/87, pulse 83, temperature 98.6 F (37 C), resp. rate 16, SpO2 95 %.  In short, Dylan [name] is a 55 y.o. male with a chief complaint of Leg Swelling .  Refer to the original H&P for additional details.  11:05 PM  [place].  He was evaluated by Dr. Donzetta Matters at the bedside who recommends Xarelto and he will follow him in the office. He is not having CP or SOB.  Patient has been anticoagulated in the distant past.  We discussed medications to not take with Xarelto along with reasons to seek urgent medical care.     Margette Fast, MD 04/26/21 2318

## 2021-04-26 NOTE — ED Triage Notes (Signed)
Pt arrives POV with c/o of left calf pain/swelling x3 days.

## 2021-04-26 NOTE — Discharge Instructions (Signed)
You have a deep vein thrombosis in your left leg and we are starting you on blood thinners.  The vascular surgery team will be reaching out to schedule a follow-up appointment for management in the office.  Do not take high-dose aspirin or NSAIDs such as ibuprofen or Aleve while taking this medication.  If you fall, hit your head, or involved in other trauma you should seek evaluation.  If you see blood in your bowel movements or have other unusual bleeding he should also seek medical treatment.

## 2021-04-27 ENCOUNTER — Other Ambulatory Visit (HOSPITAL_COMMUNITY): Payer: Self-pay

## 2021-04-28 ENCOUNTER — Encounter (HOSPITAL_COMMUNITY): Payer: Self-pay | Admitting: Emergency Medicine

## 2021-04-28 ENCOUNTER — Other Ambulatory Visit: Payer: Self-pay

## 2021-04-28 ENCOUNTER — Inpatient Hospital Stay (HOSPITAL_COMMUNITY)
Admission: EM | Admit: 2021-04-28 | Discharge: 2021-05-01 | DRG: 270 | Disposition: A | Discharge status: Home / Self Care | Payer: 59 | Attending: Vascular Surgery | Admitting: Vascular Surgery

## 2021-04-28 DIAGNOSIS — M1A9XX0 Chronic gout, unspecified, without tophus (tophi): Secondary | ICD-10-CM | POA: Diagnosis present

## 2021-04-28 DIAGNOSIS — E119 Type 2 diabetes mellitus without complications: Secondary | ICD-10-CM | POA: Diagnosis present

## 2021-04-28 DIAGNOSIS — Z9221 Personal history of antineoplastic chemotherapy: Secondary | ICD-10-CM

## 2021-04-28 DIAGNOSIS — I2510 Atherosclerotic heart disease of native coronary artery without angina pectoris: Secondary | ICD-10-CM | POA: Diagnosis present

## 2021-04-28 DIAGNOSIS — E785 Hyperlipidemia, unspecified: Secondary | ICD-10-CM | POA: Diagnosis present

## 2021-04-28 DIAGNOSIS — I82412 Acute embolism and thrombosis of left femoral vein: Secondary | ICD-10-CM | POA: Diagnosis present

## 2021-04-28 DIAGNOSIS — I82409 Acute embolism and thrombosis of unspecified deep veins of unspecified lower extremity: Secondary | ICD-10-CM | POA: Diagnosis present

## 2021-04-28 DIAGNOSIS — Z87820 Personal history of traumatic brain injury: Secondary | ICD-10-CM

## 2021-04-28 DIAGNOSIS — Z7984 Long term (current) use of oral hypoglycemic drugs: Secondary | ICD-10-CM

## 2021-04-28 DIAGNOSIS — Z7982 Long term (current) use of aspirin: Secondary | ICD-10-CM

## 2021-04-28 DIAGNOSIS — I10 Essential (primary) hypertension: Secondary | ICD-10-CM | POA: Diagnosis present

## 2021-04-28 DIAGNOSIS — I252 Old myocardial infarction: Secondary | ICD-10-CM

## 2021-04-28 DIAGNOSIS — I82402 Acute embolism and thrombosis of unspecified deep veins of left lower extremity: Secondary | ICD-10-CM | POA: Diagnosis not present

## 2021-04-28 DIAGNOSIS — Z79899 Other long term (current) drug therapy: Secondary | ICD-10-CM

## 2021-04-28 DIAGNOSIS — I69320 Aphasia following cerebral infarction: Secondary | ICD-10-CM

## 2021-04-28 DIAGNOSIS — Z8616 Personal history of COVID-19: Secondary | ICD-10-CM

## 2021-04-28 DIAGNOSIS — Z6832 Body mass index (BMI) 32.0-32.9, adult: Secondary | ICD-10-CM

## 2021-04-28 DIAGNOSIS — Z888 Allergy status to other drugs, medicaments and biological substances status: Secondary | ICD-10-CM

## 2021-04-28 DIAGNOSIS — I871 Compression of vein: Secondary | ICD-10-CM | POA: Diagnosis present

## 2021-04-28 DIAGNOSIS — M7989 Other specified soft tissue disorders: Secondary | ICD-10-CM | POA: Diagnosis present

## 2021-04-28 DIAGNOSIS — M1990 Unspecified osteoarthritis, unspecified site: Secondary | ICD-10-CM | POA: Diagnosis present

## 2021-04-28 DIAGNOSIS — Z538 Procedure and treatment not carried out for other reasons: Secondary | ICD-10-CM | POA: Diagnosis not present

## 2021-04-28 DIAGNOSIS — Z8249 Family history of ischemic heart disease and other diseases of the circulatory system: Secondary | ICD-10-CM

## 2021-04-28 DIAGNOSIS — Z9049 Acquired absence of other specified parts of digestive tract: Secondary | ICD-10-CM

## 2021-04-28 DIAGNOSIS — E876 Hypokalemia: Secondary | ICD-10-CM | POA: Diagnosis present

## 2021-04-28 DIAGNOSIS — I82422 Acute embolism and thrombosis of left iliac vein: Principal | ICD-10-CM | POA: Diagnosis present

## 2021-04-28 DIAGNOSIS — E669 Obesity, unspecified: Secondary | ICD-10-CM | POA: Diagnosis present

## 2021-04-28 DIAGNOSIS — I2699 Other pulmonary embolism without acute cor pulmonale: Secondary | ICD-10-CM | POA: Diagnosis not present

## 2021-04-28 DIAGNOSIS — Z955 Presence of coronary angioplasty implant and graft: Secondary | ICD-10-CM

## 2021-04-28 DIAGNOSIS — J95821 Acute postprocedural respiratory failure: Secondary | ICD-10-CM | POA: Diagnosis not present

## 2021-04-28 DIAGNOSIS — J9588 Other intraoperative complications of respiratory system, not elsewhere classified: Secondary | ICD-10-CM | POA: Diagnosis not present

## 2021-04-28 DIAGNOSIS — Z885 Allergy status to narcotic agent status: Secondary | ICD-10-CM

## 2021-04-28 DIAGNOSIS — Y838 Other surgical procedures as the cause of abnormal reaction of the patient, or of later complication, without mention of misadventure at the time of the procedure: Secondary | ICD-10-CM | POA: Diagnosis not present

## 2021-04-28 DIAGNOSIS — Z8546 Personal history of malignant neoplasm of prostate: Secondary | ICD-10-CM

## 2021-04-28 DIAGNOSIS — Z7901 Long term (current) use of anticoagulants: Secondary | ICD-10-CM

## 2021-04-28 DIAGNOSIS — I82432 Acute embolism and thrombosis of left popliteal vein: Secondary | ICD-10-CM | POA: Diagnosis present

## 2021-04-28 LAB — CBC WITH DIFFERENTIAL/PLATELET
Abs Immature Granulocytes: 0.04 10*3/uL (ref 0.00–0.07)
Basophils Absolute: 0 10*3/uL (ref 0.0–0.1)
Basophils Relative: 0 %
Eosinophils Absolute: 0.1 10*3/uL (ref 0.0–0.5)
Eosinophils Relative: 1 %
HCT: 41.3 % (ref 39.0–52.0)
Hemoglobin: 14.2 g/dL (ref 13.0–17.0)
Immature Granulocytes: 1 %
Lymphocytes Relative: 9 %
Lymphs Abs: 0.7 10*3/uL (ref 0.7–4.0)
MCH: 32.1 pg (ref 26.0–34.0)
MCHC: 34.4 g/dL (ref 30.0–36.0)
MCV: 93.4 fL (ref 80.0–100.0)
Monocytes Absolute: 0.8 10*3/uL (ref 0.1–1.0)
Monocytes Relative: 10 %
Neutro Abs: 6.1 10*3/uL (ref 1.7–7.7)
Neutrophils Relative %: 79 %
Platelets: 218 10*3/uL (ref 150–400)
RBC: 4.42 MIL/uL (ref 4.22–5.81)
RDW: 13.3 % (ref 11.5–15.5)
WBC: 7.7 10*3/uL (ref 4.0–10.5)
nRBC: 0 % (ref 0.0–0.2)

## 2021-04-28 LAB — BASIC METABOLIC PANEL
Anion gap: 10 (ref 5–15)
BUN: 12 mg/dL (ref 6–20)
CO2: 32 mmol/L (ref 22–32)
Calcium: 8.8 mg/dL — ABNORMAL LOW (ref 8.9–10.3)
Chloride: 95 mmol/L — ABNORMAL LOW (ref 98–111)
Creatinine, Ser: 1.03 mg/dL (ref 0.61–1.24)
GFR, Estimated: >60 mL/min (ref >60)
Glucose, Bld: 162 mg/dL — ABNORMAL HIGH (ref 70–99)
Potassium: 3.1 mmol/L — ABNORMAL LOW (ref 3.5–5.1)
Sodium: 137 mmol/L (ref 135–145)

## 2021-04-28 LAB — APTT
aPTT: 104 seconds — ABNORMAL HIGH (ref 24–36)
aPTT: 83 seconds — ABNORMAL HIGH (ref 24–36)

## 2021-04-28 LAB — HEPARIN LEVEL (UNFRACTIONATED): Heparin Unfractionated: >1.1 IU/mL — ABNORMAL HIGH (ref 0.30–0.70)

## 2021-04-28 LAB — HIV ANTIBODY (ROUTINE TESTING W REFLEX): HIV Screen 4th Generation wRfx: NONREACTIVE

## 2021-04-28 MED ORDER — POTASSIUM 99 MG PO TABS: 1.0000 | ORAL_TABLET | Freq: Every day | ORAL | Status: DC

## 2021-04-28 MED ORDER — HEPARIN BOLUS VIA INFUSION
4000.0000 [IU] | Freq: Once | INTRAVENOUS | Status: AC
  Administered 2021-04-28: 4000 [IU] via INTRAVENOUS
  Filled 2021-04-28: qty 4000

## 2021-04-28 MED ORDER — CARVEDILOL 12.5 MG PO TABS
12.5000 mg | ORAL_TABLET | Freq: Two times a day (BID) | ORAL | Status: DC
  Administered 2021-04-28 – 2021-05-01 (×6): 12.5 mg via ORAL
  Filled 2021-04-28 (×6): qty 1

## 2021-04-28 MED ORDER — POTASSIUM CHLORIDE CRYS ER 20 MEQ PO TBCR
20.0000 meq | EXTENDED_RELEASE_TABLET | Freq: Once | ORAL | Status: DC
  Filled 2021-04-28: qty 2

## 2021-04-28 MED ORDER — ALUM & MAG HYDROXIDE-SIMETH 200-200-20 MG/5ML PO SUSP: 15.0000 mL | ORAL | Status: DC | PRN

## 2021-04-28 MED ORDER — MORPHINE SULFATE (PF) 2 MG/ML IV SOLN
2.0000 mg | INTRAVENOUS | Status: DC | PRN
  Administered 2021-04-28 – 2021-04-29 (×5): 2 mg via INTRAVENOUS
  Filled 2021-04-28 (×5): qty 1

## 2021-04-28 MED ORDER — PHENOL 1.4 % MT LIQD
1.0000 | OROMUCOSAL | Status: DC | PRN
  Filled 2021-04-28: qty 177

## 2021-04-28 MED ORDER — FENTANYL CITRATE PF 50 MCG/ML IJ SOSY
100.0000 ug | PREFILLED_SYRINGE | Freq: Once | INTRAMUSCULAR | Status: AC
  Administered 2021-04-28: 100 ug via INTRAVENOUS
  Filled 2021-04-28: qty 2

## 2021-04-28 MED ORDER — POTASSIUM CHLORIDE CRYS ER 20 MEQ PO TBCR
40.0000 meq | EXTENDED_RELEASE_TABLET | Freq: Once | ORAL | Status: AC
  Administered 2021-04-28: 40 meq via ORAL
  Filled 2021-04-28: qty 2

## 2021-04-28 MED ORDER — OXYCODONE-ACETAMINOPHEN 5-325 MG PO TABS
1.0000 | ORAL_TABLET | ORAL | Status: DC | PRN
  Administered 2021-04-28: 2 via ORAL
  Filled 2021-04-28: qty 2
  Filled 2021-04-28: qty 1

## 2021-04-28 MED ORDER — HYDRALAZINE HCL 20 MG/ML IJ SOLN: 5.0000 mg | INTRAMUSCULAR | Status: DC | PRN

## 2021-04-28 MED ORDER — EMPAGLIFLOZIN 25 MG PO TABS
25.0000 mg | ORAL_TABLET | Freq: Every day | ORAL | Status: DC
  Administered 2021-04-29 – 2021-05-01 (×3): 25 mg via ORAL
  Filled 2021-04-28 (×3): qty 1

## 2021-04-28 MED ORDER — GUAIFENESIN-DM 100-10 MG/5ML PO SYRP
15.0000 mL | ORAL_SOLUTION | ORAL | Status: DC | PRN
Start: 2021-04-28 — End: 2021-05-01

## 2021-04-28 MED ORDER — HYDROMORPHONE HCL 1 MG/ML IJ SOLN
0.5000 mg | INTRAMUSCULAR | Status: DC | PRN
  Administered 2021-04-28 – 2021-04-30 (×11): 1 mg via INTRAVENOUS
  Administered 2021-04-30: 0.5 mg via INTRAVENOUS
  Administered 2021-04-30 – 2021-05-01 (×9): 1 mg via INTRAVENOUS
  Filled 2021-04-28 (×21): qty 1

## 2021-04-28 MED ORDER — POLYVINYL ALCOHOL 1.4 % OP SOLN: 1.0000 [drp] | Freq: Every day | OPHTHALMIC | Status: DC | PRN

## 2021-04-28 MED ORDER — SODIUM CHLORIDE 0.9 % IV SOLN
INTRAVENOUS | Status: DC
Start: 2021-04-28 — End: 2021-04-30

## 2021-04-28 MED ORDER — ASPIRIN EC 81 MG PO TBEC
81.0000 mg | DELAYED_RELEASE_TABLET | Freq: Every day | ORAL | Status: DC
  Administered 2021-04-29 – 2021-05-01 (×3): 81 mg via ORAL
  Filled 2021-04-28 (×3): qty 1

## 2021-04-28 MED ORDER — IRBESARTAN 300 MG PO TABS
300.0000 mg | ORAL_TABLET | Freq: Every day | ORAL | Status: DC
  Administered 2021-04-29 – 2021-05-01 (×3): 300 mg via ORAL
  Filled 2021-04-28 (×4): qty 1

## 2021-04-28 MED ORDER — HEPARIN (PORCINE) 25000 UT/250ML-% IV SOLN
1800.0000 [IU]/h | INTRAVENOUS | Status: AC
  Administered 2021-04-28 – 2021-04-29 (×2): 1700 [IU]/h via INTRAVENOUS
  Administered 2021-04-29 – 2021-04-30 (×3): 1800 [IU]/h via INTRAVENOUS
  Filled 2021-04-28 (×5): qty 250

## 2021-04-28 MED ORDER — LABETALOL HCL 5 MG/ML IV SOLN: 10.0000 mg | INTRAVENOUS | Status: DC | PRN

## 2021-04-28 MED ORDER — HYDROMORPHONE HCL 1 MG/ML IJ SOLN
1.0000 mg | Freq: Once | INTRAMUSCULAR | Status: AC
  Administered 2021-04-28: 1 mg via INTRAVENOUS
  Filled 2021-04-28: qty 1

## 2021-04-28 MED ORDER — NITROGLYCERIN 0.4 MG SL SUBL: 0.4000 mg | SUBLINGUAL_TABLET | SUBLINGUAL | Status: DC | PRN

## 2021-04-28 MED ORDER — TRAMADOL HCL 50 MG PO TABS
50.0000 mg | ORAL_TABLET | Freq: Four times a day (QID) | ORAL | Status: DC | PRN
  Administered 2021-05-01: 100 mg via ORAL
  Filled 2021-04-28 (×2): qty 2

## 2021-04-28 MED ORDER — EMPAGLIFLOZIN-METFORMIN HCL ER 25-1000 MG PO TB24: 1.0000 | ORAL_TABLET | Freq: Every day | ORAL | Status: DC

## 2021-04-28 MED ORDER — ONDANSETRON HCL 4 MG/2ML IJ SOLN
4.0000 mg | Freq: Four times a day (QID) | INTRAMUSCULAR | Status: DC | PRN
  Filled 2021-04-28: qty 2

## 2021-04-28 MED ORDER — TAMSULOSIN HCL 0.4 MG PO CAPS: 0.4000 mg | ORAL_CAPSULE | Freq: Every day | ORAL | Status: DC

## 2021-04-28 MED ORDER — METFORMIN HCL ER 500 MG PO TB24: 1000.0000 mg | ORAL_TABLET | Freq: Every day | ORAL | Status: DC

## 2021-04-28 MED ORDER — EVOLOCUMAB 140 MG/ML ~~LOC~~ SOAJ: 140.0000 mg | SUBCUTANEOUS | Status: DC

## 2021-04-28 MED ORDER — PROPYLENE GLYCOL 0.6 % OP SOLN: 1.0000 [drp] | Freq: Every day | OPHTHALMIC | Status: DC

## 2021-04-28 MED ORDER — PANTOPRAZOLE SODIUM 40 MG PO TBEC
40.0000 mg | DELAYED_RELEASE_TABLET | Freq: Every day | ORAL | Status: DC
  Administered 2021-04-28 – 2021-05-01 (×4): 40 mg via ORAL
  Filled 2021-04-28 (×4): qty 1

## 2021-04-28 MED ORDER — METOPROLOL TARTRATE 5 MG/5ML IV SOLN: 2.0000 mg | INTRAVENOUS | Status: DC | PRN

## 2021-04-28 NOTE — ED Triage Notes (Addendum)
C/o increased L leg pain/groin pain x 2-3 days.  States he has DVTs in L leg going into abd and he called his vascular surgeon and was told to come to ED today to check in for pain management prior to thrombectomy in the morning.

## 2021-04-28 NOTE — ED Notes (Addendum)
C/o pain in LLQ abd/ groin down leg to ankle, primarily groin and calf, 8/10. Denies CP, SOB, or nausea. Alert, NAD, calm, interactive.

## 2021-04-28 NOTE — ED Notes (Signed)
Dr. Donzetta Matters at bedside talking to patient about the procedure

## 2021-04-28 NOTE — ED Provider Notes (Signed)
[facility] EMERGENCY DEPARTMENT Provider Note   CSN: [id number] Arrival date & time: 04/28/21  1324    History Leg Pain   Dylan [name] is a 55 y.o. male with past medical history significant for CAD, diabetes, prior thrombosis who presents for evaluation of leg pain.  Patient states 2 to 3 days ago he started noticing pain to his left lower extremity into his groin.  Was seen here in the emergency department at that time.  Noted to have extensive DVT.  Patient elected to go home at that time.  Started on anticoagulation and was going to follow-up outpatient with vascular surgery. Unfortunately patient's pain has worsened over the last few days. He called vascular surgery, Dr. Donzetta Matters recommended come here to the emergency department for heparin, thrombectomy tomorrow.  Patient rates his current pain a 9/10.  He denies any fever, chills, chest pain, shortness of breath, back pain, abdominal pain.  No numbness, weakness. Denies additional aggravating or alleviating factors.  History obtained from patient and past medical records.  No interpreter was used.  HPI     Past Medical History:  Diagnosis Date   Arthritis    CAD (coronary artery disease) 07/2010; 07/2012; 08/2014   cardiologist--- dr croitoru;  1) 11-28-'11: NSTEMI - 100% RCA - PCI (3 Taxus ION DES 3.0 x 28, 3.0 x 24 & 3.5 x 12 distal-prox); b) 12/ 2013 ISR of RCA stent - -AnngioSculpt PTCA; c) NSTEMI 1/'16: mild ISR with Thrombosis of RCA Stents (in setting of Mesenteric Vein Thrombosis) - Cutting Balloon PTCA; c. Re-look Cath 07/2015 & 07/2016: ~15% ISR in RCA,20% p-mLAD & o-pCx.   Chronic gout    01-07-2021  last flare-up approxl 12-17-2020 bilteral great toes   DM type 2 (diabetes mellitus, type 2) ([place])    followed by pcp   (01-07-2021 per pt does not check blood sugar at home)   Edema of both lower extremities    01-07-2021  pt states wears compression hose   Essential hypertension    followed by pcp  and cardiology   History of concussion    01-07-2021  per pt x4 as child, last one age 51 without residual   History of CVA with residual deficit 73/53/2992   embolic left MCA infarct post PCI stenting approx one hour  (01-07-2021 per pt has residual minimal intermittant aphasia )   History of non-ST elevation myocardial infarction (NSTEMI) 07-01-2010  and 01/ 2016   History of prostatitis    History of resection of small bowel 08/28/2014   small bowel perforation,  s/p resction    History of retinal tear 04/2013   right micro-rupture , no occlusion, retinal artery branch w/ vision loss,  per imaging no infarct/ occlusion   History of thrombosis 08/2014   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, provoked by testosterone injection,  08-09-2014 s/p thrombolytic lysis VIR    Hyperlipidemia with target LDL less than 70 09/01/2014   Nocturia    PAC (premature atrial contraction)    pt hx palpitations, event monitor 08-18-2017  showed NS w/ occasional PACs, rare PVCs   Prostate cancer [facility]) urologist-- dr herrick/  oncologist-- dr Tammi Klippel   dx 09-04-2020,  Stage T1c, Gleason 4+3    S/P drug eluting coronary stent placement 07/01/2010   DES x3  to prox and distal RCA   S/P percutaneous transluminal coronary angioplasty 12/ 2013 and 01/ 2016   for ISR  RCA    Patient  Active Problem List   Diagnosis Date Noted   DVT (deep venous thrombosis) ([place]) 04/28/2021   Malignant neoplasm of prostate ([place]) 10/12/2020   Elevated PSA 07/16/2020   Non-insulin dependent type 2 diabetes mellitus ([place]) 06/07/2017   Coronary artery disease involving native coronary artery of native heart    Chest pain on exertion 06/06/2017   Medication management 32/67/1245   Diastolic dysfunction 80/99/8338   Finding of multiple premature atrial contractions by electrocardiography 10/05/2015   Iron deficiency anemia 09/29/2014   Obesity (BMI 30-39.9) 09/09/2014   Perforation bowel - 2/2 Mesenteric  Ischemia from Mesenteric Vein Thrombosis.    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 04/19/2013   Possible Retinal artery branch occlusion of right eye = POST CATHETERIZATION -- by dilated Eye exam - no evidence of embolic occlusoin; possible microperforation. NOT CVA 04/19/2013    Class: Acute   Hypertriglyceridemia - TG >500 04/19/2013   Dyslipidemia, statin intol 07/07/2012   Essential hypertension 07/07/2012   Smoker, quit 06/30/10 07/07/2012   CAD S/P percutaneous coronary angioplasty 07/06/2012   H/O: CVA (cerebrovascular accident) 07/06/2012   NSTEMI (non-ST elevated myocardial infarction) - 07/2010, 2013, 08/2014 07/04/2010    Past Surgical History:  Procedure Laterality Date   BOWEL RESECTION N/A 08/28/2014   Procedure: SMALL BOWEL RESECTION;  Surgeon: [name], MD;  Location: [place];  Service: General;  Laterality: N/A;   CARDIAC CATHETERIZATION N/A 07/20/2015   Procedure: Left Heart Cath and Coronary Angiography;  Surgeon: [name], MD;  Location: [place] [facility];  Service: Cardiovascular;  15% ISR in the RCA stent segment, 20% proximal-mid LAD as well as ostial and proximal circumflex. Normal LV function.   CARDIAC CATHETERIZATION N/A 07/23/2016   Procedure: Left Heart Cath and Coronary Angiography;  Surgeon: [name], MD;  Location: [place] [facility];  Service: Cardiovascular: E 55-65%. ~15% diffuse RCA ISR, ~20% diffuse pLAD & pCx   CORONARY ANGIOPLASTY  07/06/2012; 08/15/2014   a) 12/'13: PTCA of 80% ISR mRCA - AngioSculpt; b) PTCA of  ISR/thrombosis   CORONARY ANGIOPLASTY WITH STENT PLACEMENT  07-01-2010  dr  harding @[facility]    inferior wall MI - 3 Taxus Ion DES (3.0x62mm, 3.0x80mm, 3.5x34mm) to prox and mid RCA   CYSTOSCOPY N/A 01/10/2021   Procedure: CYSTOSCOPY FLEXIBLE;  Surgeon: Ardis Hughs, MD;  Location: [facility];  Service: Urology;  Laterality: N/A;   INCISIONAL HERNIA REPAIR N/A 10/15/2017   Procedure: HERNIA REPAIR INCISIONAL;  Surgeon: [name], MD;  Location: [facility];  Service: General;  Laterality: N/A;   INGUINAL HERNIA REPAIR Bilateral 05/2010   INSERTION OF MESH N/A 10/15/2017   Procedure: INSERTION OF MESH;  Surgeon: [name], MD;  Location: [facility];  Service: General;  Laterality: N/A;   KNEE ARTHROSCOPY W/ MENISCAL REPAIR Left 06/2011   LAPAROSCOPIC APPENDECTOMY N/A 08/28/2014   Procedure: APPENDECTOMY LAPAROSCOPIC;  Surgeon: [name], MD;  Location: [place];  Service: General;  Laterality: N/A;   LAPAROSCOPY  08/28/2014   Procedure: LAPAROSCOPY DIAGNOSTIC;  Surgeon: [name], MD;  Location: [place];  Service: General;;   LEFT HEART CATHETERIZATION WITH CORONARY ANGIOGRAM  N/A 06/11/2012   Procedure: LEFT HEART CATHETERIZATION WITH CORONARY ANGIOGRAM;  Surgeon: [name], MD;  Location: [place] CATH [facility];  Service: Cardiovascular;  Laterality: N/A;   LEFT HEART CATHETERIZATION WITH CORONARY ANGIOGRAM N/A 04/18/2013   Procedure: LEFT HEART CATHETERIZATION WITH CORONARY ANGIOGRAM;  Surgeon: [name], MD;  Location: [facility] CATH LAB;  Service: Cardiovascular;  Laterality: N/A;   LEFT HEART CATHETERIZATION WITH CORONARY ANGIOGRAM N/A 08/15/2014   Procedure: LEFT HEART CATHETERIZATION WITH CORONARY ANGIOGRAM;  Surgeon: [name], MD;  Location: [facility] CATH LAB;  Service: Cardiovascular;  Laterality: N/A;   PERCUTANEOUS CORONARY STENT INTERVENTION (PCI-S) N/A 07/06/2012   Procedure: PERCUTANEOUS CORONARY STENT INTERVENTION (PCI-S);  Surgeon: [name], MD;  Location: [facility] CATH LAB;  Service: Cardiovascular;  Laterality: N/A;   RADIOACTIVE SEED  IMPLANT N/A 01/10/2021   Procedure: RADIOACTIVE SEED IMPLANT/BRACHYTHERAPY IMPLANT;  Surgeon: Ardis Hughs, MD;  Location: [facility];  Service: Urology;  Laterality: N/A;   SEPTOPLASTY  11-01-2001 @ [place]   SPACE OAR INSTILLATION N/A 01/10/2021   Procedure: SPACE OAR INSTILLATION;  Surgeon: Ardis Hughs, MD;  Location: [facility];  Service: Urology;  Laterality: N/A;   TONSILLECTOMY AND ADENOIDECTOMY  age 52   TRANSTHORACIC ECHOCARDIOGRAM  08/15/2014   Moderate concentric LVH. EF 60-65% with no regional WMA. Gr 1 DD, otherwise normal       Family History  Problem Relation Age of Onset   Atrial fibrillation Mother    Mitral valve prolapse Mother    Coronary artery disease Father        CABG, aortic aneursym,   Heart attack Father    Heart attack Paternal Uncle    Heart attack Paternal Grandfather     Social History   Tobacco Use   Smoking status: Former    Packs/day: 1.00    Years: 15.00    Pack years: 15.00    Types: Cigarettes    Quit date: 06/30/2010    Years since quitting: 10.8   Smokeless tobacco: Current    Types: Snuff  Vaping Use   Vaping Use: Never used  Substance Use Topics   Alcohol use: Yes    Alcohol/week: 7.0 - 14.0 standard drinks    Types: 7 - 14 Shots of liquor per week    Comment: 1-2 bourbon on the rocks per day   Drug use: No    Home Medications Prior to Admission medications   Medication Sig Start Date End Date Taking? Authorizing Provider  aspirin EC 81 MG tablet Take 81 mg by mouth daily. Swallow whole.    [provider]  carvedilol (COREG) 12.5 MG tablet Take 1 tablet (12.5 mg total) by mouth 2 (two) times daily. 08/17/20 04/16/21  Croitoru, Mihai, MD  Cholecalciferol (VITAMIN D3 PO) Take 1 capsule by mouth daily.    [provider]  Empagliflozin-metFORMIN HCl ER (SYNJARDY XR) 25-1000 MG TB24 Take 1 tablet by mouth daily.    [provider]  Evolocumab (REPATHA SURECLICK) 024  MG/ML SOAJ Inject 140 mg into the skin every 14 (fourteen) days. 01/15/21   Kroeger, Lorelee Cover., PA-C  furosemide (LASIX) 40 MG tablet TAKE 2 TABLETS BY MOUTH EVERY MORNING AND 1 TABLET IN THE EVENING 09/25/20   Croitoru, Mihai, MD  nitroGLYCERIN (NITROSTAT) 0.4 MG SL tablet Place 1 tablet (0.4 mg total) under the tongue every 5 (five) minutes x 3 doses as needed for chest pain. 05/25/19   Kroeger, Lorelee Cover., PA-C  olmesartan [facility])  40 MG tablet Take 1 tablet (40 mg total) by mouth daily. 04/12/21   Croitoru, Mihai, MD  Potassium 99 MG TABS Take 1 tablet by mouth daily.    [provider]  Propylene Glycol (SYSTANE COMPLETE) 0.6 % SOLN Apply 1 drop to eye daily. Patient not taking: Reported on 04/16/2021    [provider]  RIVAROXABAN Alveda Reasons) VTE STARTER PACK (15 & 20 MG) Follow package directions: Take one 15mg  tablet by mouth twice a day. On day 22, switch to one 20mg  tablet once a day. Take with food. 04/26/21   Long, Wonda Olds, MD  tamsulosin (FLOMAX) 0.4 MG CAPS capsule Take 1 capsule (0.4 mg total) by mouth daily. Patient not taking: Reported on 04/16/2021 01/10/21   Ardis Hughs, MD  traMADol (ULTRAM) 50 MG tablet Take 1-2 tablets (50-100 mg total) by mouth every 6 (six) hours as needed for moderate pain. 01/10/21   Ardis Hughs, MD    Allergies    Crestor [rosuvastatin], Zofran Alvis Lemmings hcl], Ace inhibitors, Lipitor [atorvastatin], and Codeine  Review of Systems   Review of Systems  Constitutional: Negative.   HENT: Negative.    Respiratory: Negative.    Cardiovascular: Negative.   Gastrointestinal: Negative.   Genitourinary: Negative.   Musculoskeletal:        LLE pain, swelling   Skin: Negative.   Neurological: Negative.   All other systems reviewed and are negative.  Physical Exam Updated Vital Signs BP 104/67   Pulse 95   Temp 97.6 F (36.4 C) (Oral)   Resp 14   SpO2 97%   Physical Exam Vitals and nursing note reviewed.   Constitutional:      General: He is not in acute distress.    Appearance: He is well-developed. He is not ill-appearing, toxic-appearing or diaphoretic.  HENT:     Head: Normocephalic and atraumatic.     Nose: Nose normal.     Mouth/Throat:     Mouth: Mucous membranes are moist.  Eyes:     Pupils: Pupils are equal, round, and reactive to light.  Cardiovascular:     Rate and Rhythm: Normal rate and regular rhythm.     Pulses: Normal pulses.          Radial pulses are 2+ on the right side and 2+ on the left side.       Dorsalis pedis pulses are 2+ on the right side and 2+ on the left side.     Heart sounds: Normal heart sounds.  Pulmonary:     Effort: Pulmonary effort is normal. No respiratory distress.     Breath sounds: Normal breath sounds.  Abdominal:     General: Bowel sounds are normal. There is no distension.     Palpations: Abdomen is soft. There is no mass.     Tenderness: There is no abdominal tenderness. There is no guarding.  Musculoskeletal:        General: Swelling and tenderness present. No deformity or signs of injury. Normal range of motion.     Cervical back: Normal range of motion and neck supple.     Comments: Diffuse tenderness to LLE. Surrounding edema, mild erythema. No warmth.  No bony tenderness. Compartments soft.  Full range of motion without difficulty.  Skin:    General: Skin is warm and dry.     Capillary Refill: Capillary refill takes less than 2 seconds.     Comments: No lesions  Neurological:     General: No focal  deficit present.     Mental Status: He is alert and oriented to person, place, and time.     Sensory: Sensation is intact.     Motor: Motor function is intact.     Comments: Intact sensation Equal strength Bl to LE Ambulatory     ED Results / Procedures / Treatments   Labs (all labs ordered are listed, but only abnormal results are displayed) Labs Reviewed  BASIC METABOLIC PANEL - Abnormal; Notable for the following components:       Result Value   Potassium 3.1 (*)    Chloride 95 (*)    Glucose, Bld 162 (*)    Calcium 8.8 (*)    All other components within normal limits  CBC WITH DIFFERENTIAL/PLATELET  HEPARIN LEVEL (UNFRACTIONATED)  HEPARIN LEVEL (UNFRACTIONATED)  APTT  APTT    EKG None  Radiology US Venous Img Lower Unilateral Left  Result Date: 04/26/2021 CLINICAL DATA:  Left knee and calf pain with swelling EXAM: Left LOWER EXTREMITY VENOUS DOPPLER ULTRASOUND TECHNIQUE: Gray-scale sonography with graded compression, as well as color Doppler and duplex ultrasound were performed to evaluate the lower extremity deep venous systems from the level of the common femoral vein and including the common femoral, femoral, profunda femoral, popliteal and calf veins including the posterior tibial, peroneal and gastrocnemius veins when visible. The superficial great saphenous vein was also interrogated. Spectral Doppler was utilized to evaluate flow at rest and with distal augmentation maneuvers in the common femoral, femoral and popliteal veins. COMPARISON:  None. FINDINGS: Contralateral Common Femoral Vein: Respiratory phasicity is normal and symmetric with the symptomatic side. No evidence of thrombus. Normal compressibility. Common Femoral Vein: Positive for occlusive thrombus. Noncompressible. Saphenofemoral Junction: No evidence of thrombus. Normal compressibility and flow on color Doppler imaging. Profunda Femoral Vein: No evidence of thrombus. Normal compressibility and flow on color Doppler imaging. Femoral Vein: Positive for occlusive thrombus. Noncompressible vein. Popliteal Vein: Positive for occlusive thrombus. Noncompressible vein. Calf Veins: Positive for occlusive thrombus within the gastrocnemius vein in the calf. Poorly visible peroneal and posterior tibial veins. Superficial Great Saphenous Vein: No evidence of thrombus. Normal compressibility. IMPRESSION: Positive for extensive occlusive DVT involving the left  common femoral vein, femoral vein, popliteal vein, and gastrocnemius vein. Critical Value/emergent results were called by telephone at the time of interpretation on 04/26/2021 at 7:58 pm to provider [facility] , who verbally acknowledged these results. Electronically Signed   By: Donavan Foil M.D.   On: 04/26/2021 19:58    Procedures .Critical Care Performed by: Nettie Elm, PA-C Authorized by: Nettie Elm, PA-C   Critical care provider statement:    Critical care time (minutes):  45   Critical care was necessary to treat or prevent imminent or life-threatening deterioration of the following conditions:  Circulatory failure   Critical care was time spent personally by me on the following activities:  Discussions with consultants, evaluation of patient's response to treatment, examination of patient, ordering and performing treatments and interventions, ordering and review of laboratory studies, ordering and review of radiographic studies, pulse oximetry, re-evaluation of patient's condition, obtaining history from patient or surrogate and review of old charts   Medications Ordered in ED Medications  heparin ADULT infusion 100 units/mL (25000 units/27mL) (1,700 Units/hr Intravenous New Bag/Given 04/28/21 1457)  potassium chloride SA (KLOR-CON) CR tablet 40 mEq (has no administration in time range)  fentaNYL (SUBLIMAZE) injection 100 mcg (100 mcg Intravenous Given 04/28/21 1430)  heparin bolus via infusion 4,000 Units (  4,000 Units Intravenous Bolus from Bag 04/28/21 1457)    ED Course  I have reviewed the triage vital signs and the nursing notes.  Pertinent labs & imaging results that were available during my care of the patient were reviewed by me and considered in my medical decision making (see chart for details).  Here for evaluation of leg pain.  He is afebrile, nonseptic, not ill-appearing. Imaging reviewed from 2 days ago does show extensive DVT to LLE. Patient  neurovascularly intact on exam. He is without CP, SOB, tachycardia, tachypnea or hypoxia.  Low suspicion for PE at this time.  Does have some mild overlying erythema and swelling to left lower extremity however I suspect this is likely due to his VTE versus infectious process.  Heart and lungs clear.  Abdomen soft, nontender.  No obvious provoking mechanism however recently COVID-positive 2 weeks ago, does have prior history of thrombosis at that time thought likely due to exogenous hormone use which patient is no longer on. Does have remote hx of prostate CA. Plan on labs, vascular consult.  Labs and imaging personally reviewed and interpreted:  CBC without significant abnormality BMP with hypokalemia, given replacement, hyperglycemia at 162 no other significant abnormality  CONSULT with Dr. Donzetta Matters with vascular surgery. Will start Heparin, plan for thrombectomy tomorrow. Vascular to admit. Can PO today, NPO after midnight    Patient with extensive DVT requiring heparinization and admission for surgical intervention. Discussed plan with patient. Agreeable for admission.  The patient appears reasonably stabilized for admission considering the current resources, flow, and capabilities available in the ED at this time, and I doubt any other [facility] requiring further screening and/or treatment in the ED prior to admission.      MDM Rules/Calculators/A&P                            Final Clinical Impression(s) / ED Diagnoses Final diagnoses:  Acute deep vein thrombosis (DVT) of femoral vein of left lower extremity ([place])  Hypokalemia    Rx / DC Orders ED Discharge Orders     None        Elvenia Godden A, PA-C 04/28/21 1512    Isla Pence, MD 04/28/21 1521

## 2021-04-28 NOTE — ED Provider Notes (Signed)
Emergency Medicine Provider Triage Evaluation Note  Dylan [name] , a 55 y.o. male  was evaluated in triage.  Pt complains of continued left leg pain.  Seen and evaluated 2 days ago diagnosed with extensive occlusive DVT in the left lower extremity and started on Xarelto.  He was evaluated by Dr. Donzetta Matters, on-call vascular surgeon at the bedside.  He called him today due to his symptoms and was told to come to the ER for admission for thrombectomy tomorrow.  He has not been taking medicine to help with pain.  Denying any chest pain, shortness of breath, fever but does endorse nausea.  Review of Systems  Positive: Left leg pain and swelling Negative: Fever, chest pain, shortness of breath  Physical Exam  BP (!) 153/118 (BP Location: Left Arm)   Pulse 95   Temp 97.6 F (36.4 C) (Oral)   Resp 14   SpO2 97%  Gen:   Awake, no distress   Resp:  Normal effort  MSK:   Moves extremities without difficulty  Other:  Diffuse tenderness of left lower extremity.  2+ DP pulse palpated  Medical Decision Making  Medically screening exam initiated at 1:32 PM.  Appropriate orders placed.  Dylan [name] was informed that the remainder of the evaluation will be completed by another provider, this initial triage assessment does not replace that evaluation, and the importance of remaining in the ED until their evaluation is complete.  Will order basic labs.  Will need vascular surgery consult. Patient had an initial positive COVID test 14 days ago, it remained +2 days ago when we tested him in the ER.  I do not feel that we need to repeat this at this time for admission purposes, but will defer this to admission team.   Delia Heady, PA-C 04/28/21 1335    Fredia Sorrow, MD 05/06/21 1318

## 2021-04-28 NOTE — Progress Notes (Signed)
ANTICOAGULATION CONSULT NOTE - Initial Consult  Pharmacy Consult for Heparin Indication: DVT  Allergies  Allergen Reactions   Crestor [Rosuvastatin] Other (See Comments)    Severe muscle pain and nausea   Zofran [Ondansetron Hcl] Nausea And Vomiting   Ace Inhibitors Cough   Lipitor [Atorvastatin] Other (See Comments)    myalgia   Codeine Itching    Patient Measurements:   Heparin Dosing Weight: 103.9 kg  Vital Signs: Temp: 97.6 F (36.4 C) (09/25 1330) Temp Source: Oral (09/25 1330) BP: 153/118 (09/25 1330) Pulse Rate: 95 (09/25 1330)  Labs: Recent Labs    04/26/21 2020 04/28/21 1336  HGB 13.1 14.2  HCT 38.0* 41.3  PLT 174 218  APTT 28  --   LABPROT 14.3  --   INR 1.1  --   CREATININE 0.92  --     Estimated Creatinine Clearance: 118.3 mL/min (by C-G formula based on SCr of 0.92 mg/dL).   Medical History: Past Medical History:  Diagnosis Date   Arthritis    CAD (coronary artery disease) 07/2010; 07/2012; 08/2014   cardiologist--- dr croitoru;  1) 11-28-'11: NSTEMI - 100% RCA - PCI (3 Taxus ION DES 3.0 x 28, 3.0 x 24 & 3.5 x 12 distal-prox); b) 12/ 2013 ISR of RCA stent - -AnngioSculpt PTCA; c) NSTEMI 1/'16: mild ISR with Thrombosis of RCA Stents (in setting of Mesenteric Vein Thrombosis) - Cutting Balloon PTCA; c. Re-look Cath 07/2015 & 07/2016: ~15% ISR in RCA,20% p-mLAD & o-pCx.   Chronic gout    01-07-2021  last flare-up approxl 12-17-2020 bilteral great toes   DM type 2 (diabetes mellitus, type 2) ([place])    followed by pcp   (01-07-2021 per pt does not check blood sugar at home)   Edema of both lower extremities    01-07-2021  pt states wears compression hose   Essential hypertension    followed by pcp and cardiology   History of concussion    01-07-2021  per pt x4 as child, last one age 18 without residual   History of CVA with residual deficit 87/86/7672   embolic left MCA infarct post PCI stenting approx one hour  (01-07-2021 per pt has residual  minimal intermittant aphasia )   History of non-ST elevation myocardial infarction (NSTEMI) 07-01-2010  and 01/ 2016   History of prostatitis    History of resection of small bowel 08/28/2014   small bowel perforation,  s/p resction    History of retinal tear 04/2013   right micro-rupture , no occlusion, retinal artery branch w/ vision loss,  per imaging no infarct/ occlusion   History of thrombosis 08/2014   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, provoked by testosterone injection,  08-09-2014 s/p thrombolytic lysis VIR    Hyperlipidemia with target LDL less than 70 09/01/2014   Nocturia    PAC (premature atrial contraction)    pt hx palpitations, event monitor 08-18-2017  showed NS w/ occasional PACs, rare PVCs   Prostate cancer [facility]) urologist-- dr herrick/  oncologist-- dr Tammi Klippel   dx 09-04-2020,  Stage T1c, Gleason 4+3    S/P drug eluting coronary stent placement 07/01/2010   DES x3  to prox and distal RCA   S/P percutaneous transluminal coronary angioplasty 12/ 2013 and 01/ 2016   for ISR  RCA    Medications:  (Not in a hospital admission)  Scheduled:   fentaNYL (SUBLIMAZE) injection  100 mcg Intravenous Once   Infusions:  PRN:   Assessment:  55 yom with a history of CAD, diabetes, prior thrombosis. Patient is presenting with extensive DVT. Patient recently diagnosed with DVT and prescribed Xarelto with plan for return to vascular outpatient and IVC iliac duplex. Patient returning today with 10/10 pain. Heparin per pharmacy consult placed for DVT.  Patient is on Xarelto prior to arrival. This was started two days ago at [facility] ED. Last dose 9/25am. Will require aPTT monitoring due to likely falsely high anti-Xa level secondary to DOAC use.  Hgb14.2;plt 218  Goal of Therapy:  Heparin level 0.3-0.7 units/ml Heparin level 66-102 units/ml Monitor platelets by anticoagulation protocol: Yes   Plan:  Given may represent treatment failure will provide bolus  but at a reduced dose at 4000 units (~1/2 usual VTE bolus dose for his weight) Start heparin infusion at 1700 units/hr Check aPTT & anti-Xa level in 6 hours and daily while on heparin Continue to monitor via aPTT until levels are correlated Continue to monitor H&H and platelets  Lorelei Pont, PharmD, BCPS 04/28/2021 2:21 PM ED Clinical Pharmacist -  [phone]

## 2021-04-28 NOTE — Progress Notes (Addendum)
ANTICOAGULATION CONSULT NOTE   Pharmacy Consult for Heparin Indication: DVT  Allergies  Allergen Reactions   Crestor [Rosuvastatin] Other (See Comments)    Severe muscle pain and nausea   Zofran [Ondansetron Hcl] Nausea And Vomiting   Ace Inhibitors Cough   Lipitor [Atorvastatin] Other (See Comments)    myalgia   Codeine Itching    Patient Measurements:   Heparin Dosing Weight: 103.9 kg  Vital Signs: Temp: 97.6 F (36.4 C) (09/25 1330) Temp Source: Oral (09/25 1330) BP: 114/68 (09/25 1930) Pulse Rate: 77 (09/25 1858)  Labs: Recent Labs    04/26/21 2020 04/28/21 1336 04/28/21 2043  HGB 13.1 14.2  --   HCT 38.0* 41.3  --   PLT 174 218  --   APTT 28  --  104*  LABPROT 14.3  --   --   INR 1.1  --   --   HEPARINUNFRC  --   --  >1.10*  CREATININE 0.92 1.03  --      Estimated Creatinine Clearance: 105.7 mL/min (by C-G formula based on SCr of 1.03 mg/dL).   Medical History: Past Medical History:  Diagnosis Date   Arthritis    CAD (coronary artery disease) 07/2010; 07/2012; 08/2014   cardiologist--- dr croitoru;  1) 11-28-'11: NSTEMI - 100% RCA - PCI (3 Taxus ION DES 3.0 x 28, 3.0 x 24 & 3.5 x 12 distal-prox); b) 12/ 2013 ISR of RCA stent - -AnngioSculpt PTCA; c) NSTEMI 1/'16: mild ISR with Thrombosis of RCA Stents (in setting of Mesenteric Vein Thrombosis) - Cutting Balloon PTCA; c. Re-look Cath 07/2015 & 07/2016: ~15% ISR in RCA,20% p-mLAD & o-pCx.   Chronic gout    01-07-2021  last flare-up approxl 12-17-2020 bilteral great toes   DM type 2 (diabetes mellitus, type 2) ([place])    followed by pcp   (01-07-2021 per pt does not check blood sugar at home)   Edema of both lower extremities    01-07-2021  pt states wears compression hose   Essential hypertension    followed by pcp and cardiology   History of concussion    01-07-2021  per pt x4 as child, last one age 55 without residual   History of CVA with residual deficit 34/19/3790   embolic left MCA infarct post  PCI stenting approx one hour  (01-07-2021 per pt has residual minimal intermittant aphasia )   History of non-ST elevation myocardial infarction (NSTEMI) 07-01-2010  and 01/ 2016   History of prostatitis    History of resection of small bowel 08/28/2014   small bowel perforation,  s/p resction    History of retinal tear 04/2013   right micro-rupture , no occlusion, retinal artery branch w/ vision loss,  per imaging no infarct/ occlusion   History of thrombosis 08/2014   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, provoked by testosterone injection,  08-09-2014 s/p thrombolytic lysis VIR    Hyperlipidemia with target LDL less than 70 09/01/2014   Nocturia    PAC (premature atrial contraction)    pt hx palpitations, event monitor 08-18-2017  showed NS w/ occasional PACs, rare PVCs   Prostate cancer [facility]) urologist-- dr herrick/  oncologist-- dr Tammi Klippel   dx 09-04-2020,  Stage T1c, Gleason 4+3    S/P drug eluting coronary stent placement 07/01/2010   DES x3  to prox and distal RCA   S/P percutaneous transluminal coronary angioplasty 12/ 2013 and 01/ 2016   for ISR  RCA  Medications:  (Not in a hospital admission)  Scheduled:   [START ON 04/29/2021] aspirin EC  81 mg Oral Daily   carvedilol  12.5 mg Oral BID   [START ON 04/29/2021] empagliflozin  25 mg Oral Daily   irbesartan  300 mg Oral Daily   [START ON 04/29/2021] metFORMIN  1,000 mg Oral Q breakfast   pantoprazole  40 mg Oral Daily   potassium chloride  20-40 mEq Oral Once   Infusions:   sodium chloride 75 mL/hr at 04/28/21 1900   heparin 1,700 Units/hr (04/28/21 1457)   PRN:   Assessment: 55 yom with a history of CAD, diabetes, prior thrombosis. Patient is presenting with extensive DVT. Patient recently diagnosed with DVT and prescribed Xarelto with plan for return to vascular outpatient and IVC iliac duplex. Patient returning today with 10/10 pain. Heparin per pharmacy consult placed for DVT.  Patient is on  Xarelto prior to arrival. This was started two days ago at [facility] ED. Last dose 9/25am. Will require aPTT monitoring due to likely falsely high anti-Xa level secondary to DOAC use.  Heparin level came back undetectably high as expected with recent DOAC, aPTT also came back slightly supratherapeutic at 104, on 1700 units/hr. Drawn from same arm above where heparin is infusing - infusion was paused and blood wasted appropriately before level collected. Repeat aPTT came back therapeutic at 83. No s/sx of bleeding or infusion issues.   Goal of Therapy:  Heparin level 0.3-0.7 units/ml Heparin level 66-102 units/ml Monitor platelets by anticoagulation protocol: Yes   Plan:  Continue heparin infusion at 1700 units/hr Monitor heparin level in 6 hours  Monitor for s/sx of bleeding, CBC, daily HL  Antonietta Jewel, PharmD, [place] Pharmacist  Phone: [phone] 04/28/2021 9:29 PM  Please check AMION for all [place] phone numbers After 10:00 PM, call [place] [phone]

## 2021-04-28 NOTE — ED Notes (Signed)
O2 sat continues to drop down to 76%-88% with perfect pleth. Pt denies SOB or trouble breathing. This RN was unsure the accuracy of the O2 reading. This RN switched out the pulse ox, switched multiple fingers, ears and even toes. O2 sats range from 98% - 76%. 2L [place] applied to see if this helps maintain a pulse ox reading above 93%.

## 2021-04-28 NOTE — H&P (Signed)
H+P  History of Present Illness: This is a 55 y.o. male recently diagnosed extensive left lower extremity DVT.  His plan was for discharge home and return to the office with IVC iliac duplex.  Unfortunately patient states pain is now 10 out of 10 and he cannot control at home.  Swelling is worse.  Rubor is worse particularly when dependent.  Patient did not go on a fishing trip as previously planned due to above-noted issues.  He does have a history of mechanical thrombectomy of his portal vein also has previous history of coronary artery stenting and more recently prostate cancer.  No frank history of lower extremity DVT until this episode.  Was prescribed Xarelto 2 days ago.  Last dose was this morning.  Past Medical History:  Diagnosis Date   Arthritis    CAD (coronary artery disease) 07/2010; 07/2012; 08/2014   cardiologist--- dr croitoru;  1) 11-28-'11: NSTEMI - 100% RCA - PCI (3 Taxus ION DES 3.0 x 28, 3.0 x 24 & 3.5 x 12 distal-prox); b) 12/ 2013 ISR of RCA stent - -AnngioSculpt PTCA; c) NSTEMI 1/'16: mild ISR with Thrombosis of RCA Stents (in setting of Mesenteric Vein Thrombosis) - Cutting Balloon PTCA; c. Re-look Cath 07/2015 & 07/2016: ~15% ISR in RCA,20% p-mLAD & o-pCx.   Chronic gout    01-07-2021  last flare-up approxl 12-17-2020 bilteral great toes   DM type 2 (diabetes mellitus, type 2) ([place])    followed by pcp   (01-07-2021 per pt does not check blood sugar at home)   Edema of both lower extremities    01-07-2021  pt states wears compression hose   Essential hypertension    followed by pcp and cardiology   History of concussion    01-07-2021  per pt x4 as child, last one age 73 without residual   History of CVA with residual deficit 27/10/5007   embolic left MCA infarct post PCI stenting approx one hour  (01-07-2021 per pt has residual minimal intermittant aphasia )   History of non-ST elevation myocardial infarction (NSTEMI) 07-01-2010  and 01/ 2016   History of prostatitis     History of resection of small bowel 08/28/2014   small bowel perforation,  s/p resction    History of retinal tear 04/2013   right micro-rupture , no occlusion, retinal artery branch w/ vision loss,  per imaging no infarct/ occlusion   History of thrombosis 08/2014   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, provoked by testosterone injection,  08-09-2014 s/p thrombolytic lysis VIR    Hyperlipidemia with target LDL less than 70 09/01/2014   Nocturia    PAC (premature atrial contraction)    pt hx palpitations, event monitor 08-18-2017  showed NS w/ occasional PACs, rare PVCs   Prostate cancer [facility]) urologist-- dr herrick/  oncologist-- dr Tammi Klippel   dx 09-04-2020,  Stage T1c, Gleason 4+3    S/P drug eluting coronary stent placement 07/01/2010   DES x3  to prox and distal RCA   S/P percutaneous transluminal coronary angioplasty 12/ 2013 and 01/ 2016   for ISR  RCA    Past Surgical History:  Procedure Laterality Date   BOWEL RESECTION N/A 08/28/2014   Procedure: SMALL BOWEL RESECTION;  Surgeon: [name], MD;  Location: [facility];  Service: General;  Laterality: N/A;   CARDIAC CATHETERIZATION N/A 07/20/2015   Procedure: Left Heart Cath and Coronary Angiography;  Surgeon: [name], MD;  Location: [place] CV LAB;  Service: Cardiovascular;  15% ISR in the RCA stent segment, 20% proximal-mid LAD as well as ostial and proximal circumflex. Normal LV function.   CARDIAC CATHETERIZATION N/A 07/23/2016   Procedure: Left Heart Cath and Coronary Angiography;  Surgeon: [name], MD;  Location: [place] CV LAB;  Service: Cardiovascular: E 55-65%. ~15% diffuse RCA ISR, ~20% diffuse pLAD & pCx   CORONARY ANGIOPLASTY  07/06/2012; 08/15/2014   a) 12/'13: PTCA of 80% ISR mRCA - AngioSculpt; b) PTCA of  ISR/thrombosis   CORONARY ANGIOPLASTY WITH STENT PLACEMENT  07-01-2010  dr harding @[facility]    inferior wall MI - 3 Taxus Ion DES (3.0x16mm, 3.0x65mm, 3.5x24mm) to prox and mid  RCA   CYSTOSCOPY N/A 01/10/2021   Procedure: CYSTOSCOPY FLEXIBLE;  Surgeon: Ardis Hughs, MD;  Location: [facility];  Service: Urology;  Laterality: N/A;   INCISIONAL HERNIA REPAIR N/A 10/15/2017   Procedure: HERNIA REPAIR INCISIONAL;  Surgeon: [name], MD;  Location: [facility];  Service: General;  Laterality: N/A;   INGUINAL HERNIA REPAIR Bilateral 05/2010   INSERTION OF MESH N/A 10/15/2017   Procedure: INSERTION OF MESH;  Surgeon: [name], MD;  Location: [facility];  Service: General;  Laterality: N/A;   KNEE ARTHROSCOPY W/ MENISCAL REPAIR Left 06/2011   LAPAROSCOPIC APPENDECTOMY N/A 08/28/2014   Procedure: APPENDECTOMY LAPAROSCOPIC;  Surgeon: [name], MD;  Location: [place];  Service: General;  Laterality: N/A;   LAPAROSCOPY  08/28/2014   Procedure: LAPAROSCOPY DIAGNOSTIC;  Surgeon: [name], MD;  Location: [place];  Service: General;;   LEFT HEART CATHETERIZATION WITH CORONARY ANGIOGRAM N/A 06/11/2012   Procedure: LEFT HEART CATHETERIZATION WITH CORONARY ANGIOGRAM;  Surgeon: [name], MD;  Location: [place] CATH LAB;  Service: Cardiovascular;  Laterality: N/A;   LEFT HEART CATHETERIZATION WITH CORONARY ANGIOGRAM N/A 04/18/2013   Procedure: LEFT HEART CATHETERIZATION WITH CORONARY ANGIOGRAM;  Surgeon: [name], MD;  Location: [facility] CATH LAB;  Service: Cardiovascular;  Laterality: N/A;   LEFT HEART CATHETERIZATION WITH CORONARY ANGIOGRAM N/A 08/15/2014   Procedure: LEFT HEART CATHETERIZATION WITH CORONARY ANGIOGRAM;  Surgeon: [name], MD;  Location: [facility] CATH LAB;  Service: Cardiovascular;  Laterality: N/A;   PERCUTANEOUS CORONARY STENT INTERVENTION (PCI-S) N/A 07/06/2012   Procedure: PERCUTANEOUS CORONARY STENT INTERVENTION (PCI-S);  Surgeon: [name], MD;  Location: [facility] CATH LAB;  Service: Cardiovascular;  Laterality: N/A;   RADIOACTIVE SEED IMPLANT N/A 01/10/2021   Procedure: RADIOACTIVE SEED IMPLANT/BRACHYTHERAPY IMPLANT;  Surgeon:  Ardis Hughs, MD;  Location: [facility];  Service: Urology;  Laterality: N/A;   SEPTOPLASTY  11-01-2001 @ [place]   SPACE OAR INSTILLATION N/A 01/10/2021   Procedure: SPACE OAR INSTILLATION;  Surgeon: Ardis Hughs, MD;  Location: [facility];  Service: Urology;  Laterality: N/A;   TONSILLECTOMY AND ADENOIDECTOMY  age 56   TRANSTHORACIC ECHOCARDIOGRAM  08/15/2014   Moderate concentric LVH. EF 60-65% with no regional WMA. Gr 1 DD, otherwise normal    Allergies  Allergen Reactions   Crestor [Rosuvastatin] Other (See Comments)    Severe muscle pain and nausea   Zofran [Ondansetron Hcl] Nausea And Vomiting   Ace Inhibitors Cough   Lipitor [Atorvastatin] Other (See Comments)    myalgia   Codeine Itching    Prior to Admission medications   Medication Sig Start Date End Date Taking? Authorizing Provider  aspirin EC 81 MG tablet Take 81 mg by mouth daily. Swallow whole.    [provider]  carvedilol (COREG)  12.5 MG tablet Take 1 tablet (12.5 mg total) by mouth 2 (two) times daily. 08/17/20 04/16/21  Croitoru, Mihai, MD  Cholecalciferol (VITAMIN D3 PO) Take 1 capsule by mouth daily.    [provider]  Empagliflozin-metFORMIN HCl ER (SYNJARDY XR) 25-1000 MG TB24 Take 1 tablet by mouth daily.    [provider]  Evolocumab (REPATHA SURECLICK) 542 MG/ML SOAJ Inject 140 mg into the skin every 14 (fourteen) days. 01/15/21   Kroeger, Lorelee Cover., PA-C  furosemide (LASIX) 40 MG tablet TAKE 2 TABLETS BY MOUTH EVERY MORNING AND 1 TABLET IN THE EVENING 09/25/20   Croitoru, Mihai, MD  nitroGLYCERIN (NITROSTAT) 0.4 MG SL tablet Place 1 tablet (0.4 mg total) under the tongue every 5 (five) minutes x 3 doses as needed for chest pain. 05/25/19   Kroeger, Lorelee Cover., PA-C  olmesartan (BENICAR) 40 MG tablet Take 1 tablet (40 mg total) by mouth daily. 04/12/21   Croitoru, Mihai, MD  Potassium 99 MG TABS Take 1 tablet by mouth daily.    [provider]  Propylene Glycol (SYSTANE COMPLETE) 0.6 % SOLN Apply 1 drop to eye daily. Patient not taking: Reported on 04/16/2021    [provider]  RIVAROXABAN Alveda Reasons) VTE STARTER PACK (15 & 20 MG) Follow package directions: Take one 15mg  tablet by mouth twice a day. On day 22, switch to one 20mg  tablet once a day. Take with food. 04/26/21   Long, Wonda Olds, MD  tamsulosin (FLOMAX) 0.4 MG CAPS capsule Take 1 capsule (0.4 mg total) by mouth daily. Patient not taking: Reported on 04/16/2021 01/10/21   Ardis Hughs, MD  traMADol (ULTRAM) 50 MG tablet Take 1-2 tablets (50-100 mg total) by mouth every 6 (six) hours as needed for moderate pain. 01/10/21   Ardis Hughs, MD    Social History   Socioeconomic History   Marital status: Married    Spouse name: Curt Bears   Number of children: 4   Years of education: 12   Highest education level: Not on file  Occupational History   Occupation: Pharmacologist: [employer]  Tobacco Use   Smoking status: Former    Packs/day: 1.00    Years: 15.00    Pack years: 15.00    Types: Cigarettes    Quit date: 06/30/2010    Years since quitting: 10.8   Smokeless tobacco: Current    Types: Snuff  Vaping Use   Vaping Use: Never used  Substance and Sexual Activity   Alcohol use: Yes    Alcohol/week: 7.0 - 14.0 standard drinks    Types: 7 - 14 Shots of liquor per week    Comment: 1-2 bourbon on the rocks per day   Drug use: No   Sexual activity: Yes    Birth control/protection: Surgical    Comment: vasectomy  Other Topics Concern   Not on file  Social History Narrative   Not on file   Social Determinants of Health   Financial Resource Strain: Not on file  Food Insecurity: Not on file  Transportation Needs: Not on file  Physical Activity: Not on file  Stress: Not on file  Social Connections: Not on file  Intimate Partner Violence: Not on file     Family History  Problem Relation Age of Onset   Atrial fibrillation Mother     Mitral valve prolapse Mother    Coronary artery disease Father        CABG, aortic aneursym,   Heart  attack Father    Heart attack Paternal Uncle    Heart attack Paternal Grandfather     Review of Systems  Constitutional: Negative.   HENT: Negative.    Respiratory: Negative.    Cardiovascular:  Positive for leg swelling.  Gastrointestinal: Negative.   Musculoskeletal:        Leg pain  Skin: Negative.   Neurological: Negative.   Endo/Heme/Allergies: Negative.   Psychiatric/Behavioral: Negative.       Physical Examination  Vitals:   04/28/21 1330  BP: (!) 153/118  Pulse: 95  Resp: 14  Temp: 97.6 F (36.4 C)  SpO2: 97%   There is no height or weight on file to calculate BMI.  Physical Exam Constitutional:      Appearance: Normal appearance.  HENT:     Head: Normocephalic.     Mouth/Throat:     Mouth: Mucous membranes are moist.  Eyes:     Pupils: Pupils are equal, round, and reactive to light.  Cardiovascular:     Rate and Rhythm: Normal rate.  Pulmonary:     Effort: Pulmonary effort is normal.  Abdominal:     General: Abdomen is flat.     Palpations: Abdomen is soft. There is no mass.  Musculoskeletal:     Cervical back: Normal range of motion and neck supple.     Right lower leg: No edema.     Left lower leg: Edema present.     Comments: Tenderness to palpation left lower extremity particular below the knee  Skin:    General: Skin is warm.     Capillary Refill: Capillary refill takes less than 2 seconds.  Neurological:     General: No focal deficit present.     Mental Status: He is alert.  Psychiatric:        Mood and Affect: Mood normal.        Thought Content: Thought content normal.        Judgment: Judgment normal.    CBC    Component Value Date/Time   WBC 4.7 04/26/2021 2020   RBC 4.12 (L) 04/26/2021 2020   HGB 13.1 04/26/2021 2020   HGB 12.6 (L) 10/04/2014 1444   HCT 38.0 (L) 04/26/2021 2020   HCT 38.7 10/04/2014 1444   PLT 174  04/26/2021 2020   PLT 293 10/04/2014 1444   MCV 92.2 04/26/2021 2020   MCV 85.4 10/04/2014 1444   MCH 31.8 04/26/2021 2020   MCHC 34.5 04/26/2021 2020   RDW 13.5 04/26/2021 2020   RDW 14.0 10/04/2014 1444   LYMPHSABS 0.7 04/26/2021 2020   LYMPHSABS 2.6 10/04/2014 1444   MONOABS 0.7 04/26/2021 2020   MONOABS 0.5 10/04/2014 1444   EOSABS 0.1 04/26/2021 2020   EOSABS 0.1 10/04/2014 1444   BASOSABS 0.0 04/26/2021 2020   BASOSABS 0.0 10/04/2014 1444    BMET    Component Value Date/Time   NA 139 04/26/2021 2020   NA 140 09/28/2014 1438   K 3.1 (L) 04/26/2021 2020   K 4.3 09/28/2014 1438   CL 97 (L) 04/26/2021 2020   CO2 31 04/26/2021 2020   CO2 25 09/28/2014 1438   GLUCOSE 104 (H) 04/26/2021 2020   GLUCOSE 134 09/28/2014 1438   BUN 20 04/26/2021 2020   BUN 27.4 (H) 10/04/2014 1445   CREATININE 0.92 04/26/2021 2020   CREATININE 1.14 05/29/2016 1157   CREATININE 1.6 (H) 10/04/2014 1445   CALCIUM 9.6 04/26/2021 2020   CALCIUM 9.5 09/28/2014 1438   GFRNONAA >60  04/26/2021 2020   GFRAA >60 01/13/2019 1552    COAGS: Lab Results  Component Value Date   INR 1.1 04/26/2021   INR 1.0 01/08/2021   INR 1.10 07/23/2016       ASSESSMENT/PLAN: This is a 55 y.o. male with extensive left lower extremity DVT now returns with worsened pain and swelling.  Given that DVT involve the common femoral vein we will plan for left lower extremity thrombectomy tomorrow in the prone position.  I discussed the risk benefits and alternatives he demonstrated good understanding.  We will get his pain controlled today and he can eat.  He will be n.p.o. past midnight.  Heparin to be started in the ED and can continue throughout the procedure.  Malayia Spizzirri C. Donzetta Matters, MD [facility] Office: [phone] Pager: [phone]

## 2021-04-28 NOTE — ED Notes (Signed)
Pt alert, NAD, calm, interactive, texting on phone.

## 2021-04-28 NOTE — ED Notes (Signed)
Ambulatory to b/r with RN assistance.

## 2021-04-28 NOTE — Care Management (Signed)
Walked patient to [facility] after verifying with ED RN.      Laurena Slimmer RN, BSN  ED Care Manager [phone]

## 2021-04-29 ENCOUNTER — Observation Stay (HOSPITAL_COMMUNITY): Payer: 59

## 2021-04-29 ENCOUNTER — Encounter (HOSPITAL_COMMUNITY)
Admission: EM | Disposition: A | Discharge status: Home / Self Care | Payer: Self-pay | Source: Home / Self Care | Attending: Vascular Surgery

## 2021-04-29 DIAGNOSIS — J9588 Other intraoperative complications of respiratory system, not elsewhere classified: Secondary | ICD-10-CM | POA: Diagnosis not present

## 2021-04-29 DIAGNOSIS — E876 Hypokalemia: Secondary | ICD-10-CM | POA: Diagnosis present

## 2021-04-29 DIAGNOSIS — Z7901 Long term (current) use of anticoagulants: Secondary | ICD-10-CM | POA: Diagnosis not present

## 2021-04-29 DIAGNOSIS — I252 Old myocardial infarction: Secondary | ICD-10-CM | POA: Diagnosis not present

## 2021-04-29 DIAGNOSIS — I82409 Acute embolism and thrombosis of unspecified deep veins of unspecified lower extremity: Secondary | ICD-10-CM | POA: Diagnosis present

## 2021-04-29 DIAGNOSIS — Z6832 Body mass index (BMI) 32.0-32.9, adult: Secondary | ICD-10-CM | POA: Diagnosis not present

## 2021-04-29 DIAGNOSIS — I82412 Acute embolism and thrombosis of left femoral vein: Secondary | ICD-10-CM | POA: Diagnosis present

## 2021-04-29 DIAGNOSIS — I69320 Aphasia following cerebral infarction: Secondary | ICD-10-CM | POA: Diagnosis not present

## 2021-04-29 DIAGNOSIS — I10 Essential (primary) hypertension: Secondary | ICD-10-CM | POA: Diagnosis present

## 2021-04-29 DIAGNOSIS — E785 Hyperlipidemia, unspecified: Secondary | ICD-10-CM | POA: Diagnosis present

## 2021-04-29 DIAGNOSIS — I82422 Acute embolism and thrombosis of left iliac vein: Secondary | ICD-10-CM | POA: Diagnosis present

## 2021-04-29 DIAGNOSIS — J95821 Acute postprocedural respiratory failure: Secondary | ICD-10-CM | POA: Diagnosis not present

## 2021-04-29 DIAGNOSIS — E669 Obesity, unspecified: Secondary | ICD-10-CM | POA: Diagnosis present

## 2021-04-29 DIAGNOSIS — Z9221 Personal history of antineoplastic chemotherapy: Secondary | ICD-10-CM | POA: Diagnosis not present

## 2021-04-29 DIAGNOSIS — Z8616 Personal history of COVID-19: Secondary | ICD-10-CM | POA: Diagnosis not present

## 2021-04-29 DIAGNOSIS — I2699 Other pulmonary embolism without acute cor pulmonale: Secondary | ICD-10-CM | POA: Diagnosis not present

## 2021-04-29 DIAGNOSIS — Y838 Other surgical procedures as the cause of abnormal reaction of the patient, or of later complication, without mention of misadventure at the time of the procedure: Secondary | ICD-10-CM | POA: Diagnosis not present

## 2021-04-29 DIAGNOSIS — I2693 Single subsegmental pulmonary embolism without acute cor pulmonale: Secondary | ICD-10-CM | POA: Diagnosis not present

## 2021-04-29 DIAGNOSIS — I82432 Acute embolism and thrombosis of left popliteal vein: Secondary | ICD-10-CM | POA: Diagnosis present

## 2021-04-29 DIAGNOSIS — M1A9XX0 Chronic gout, unspecified, without tophus (tophi): Secondary | ICD-10-CM | POA: Diagnosis present

## 2021-04-29 DIAGNOSIS — Z8546 Personal history of malignant neoplasm of prostate: Secondary | ICD-10-CM | POA: Diagnosis not present

## 2021-04-29 DIAGNOSIS — I871 Compression of vein: Secondary | ICD-10-CM | POA: Diagnosis present

## 2021-04-29 DIAGNOSIS — Z87820 Personal history of traumatic brain injury: Secondary | ICD-10-CM | POA: Diagnosis not present

## 2021-04-29 DIAGNOSIS — I82402 Acute embolism and thrombosis of unspecified deep veins of left lower extremity: Secondary | ICD-10-CM | POA: Diagnosis present

## 2021-04-29 DIAGNOSIS — M1990 Unspecified osteoarthritis, unspecified site: Secondary | ICD-10-CM | POA: Diagnosis present

## 2021-04-29 DIAGNOSIS — Z8249 Family history of ischemic heart disease and other diseases of the circulatory system: Secondary | ICD-10-CM | POA: Diagnosis not present

## 2021-04-29 DIAGNOSIS — E119 Type 2 diabetes mellitus without complications: Secondary | ICD-10-CM | POA: Diagnosis present

## 2021-04-29 DIAGNOSIS — I2510 Atherosclerotic heart disease of native coronary artery without angina pectoris: Secondary | ICD-10-CM | POA: Diagnosis present

## 2021-04-29 DIAGNOSIS — I824Y2 Acute embolism and thrombosis of unspecified deep veins of left proximal lower extremity: Secondary | ICD-10-CM | POA: Diagnosis not present

## 2021-04-29 HISTORY — PX: LOWER EXTREMITY VENOGRAPHY: CATH118253

## 2021-04-29 HISTORY — PX: PERIPHERAL VASCULAR THROMBECTOMY: CATH118306

## 2021-04-29 HISTORY — PX: INTRAVASCULAR ULTRASOUND/IVUS: CATH118244

## 2021-04-29 LAB — CBC
HCT: 37.1 % — ABNORMAL LOW (ref 39.0–52.0)
Hemoglobin: 12.4 g/dL — ABNORMAL LOW (ref 13.0–17.0)
MCH: 32 pg (ref 26.0–34.0)
MCHC: 33.4 g/dL (ref 30.0–36.0)
MCV: 95.6 fL (ref 80.0–100.0)
Platelets: 173 10*3/uL (ref 150–400)
RBC: 3.88 MIL/uL — ABNORMAL LOW (ref 4.22–5.81)
RDW: 13.6 % (ref 11.5–15.5)
WBC: 6.9 10*3/uL (ref 4.0–10.5)
nRBC: 0 % (ref 0.0–0.2)

## 2021-04-29 LAB — GLUCOSE, CAPILLARY
Glucose-Capillary: 76 mg/dL (ref 70–99)
Glucose-Capillary: 116 mg/dL — ABNORMAL HIGH (ref 70–99)
Glucose-Capillary: 136 mg/dL — ABNORMAL HIGH (ref 70–99)

## 2021-04-29 LAB — APTT
aPTT: 68 seconds — ABNORMAL HIGH (ref 24–36)
aPTT: 75 seconds — ABNORMAL HIGH (ref 24–36)

## 2021-04-29 LAB — HEPARIN LEVEL (UNFRACTIONATED): Heparin Unfractionated: >1.1 IU/mL — ABNORMAL HIGH (ref 0.30–0.70)

## 2021-04-29 LAB — BASIC METABOLIC PANEL
Anion gap: 10 (ref 5–15)
BUN: 11 mg/dL (ref 6–20)
CO2: 30 mmol/L (ref 22–32)
Calcium: 8.5 mg/dL — ABNORMAL LOW (ref 8.9–10.3)
Chloride: 96 mmol/L — ABNORMAL LOW (ref 98–111)
Creatinine, Ser: 0.93 mg/dL (ref 0.61–1.24)
GFR, Estimated: >60 mL/min (ref >60)
Glucose, Bld: 107 mg/dL — ABNORMAL HIGH (ref 70–99)
Potassium: 3.4 mmol/L — ABNORMAL LOW (ref 3.5–5.1)
Sodium: 136 mmol/L (ref 135–145)

## 2021-04-29 SURGERY — LOWER EXTREMITY VENOGRAPHY
Anesthesia: LOCAL | Laterality: Left

## 2021-04-29 MED ORDER — FENTANYL CITRATE (PF) 100 MCG/2ML IJ SOLN
INTRAMUSCULAR | Status: DC | PRN
  Administered 2021-04-29 (×2): 100 ug via INTRAVENOUS

## 2021-04-29 MED ORDER — MIDAZOLAM HCL 2 MG/2ML IJ SOLN
INTRAMUSCULAR | Status: DC | PRN
  Administered 2021-04-29 (×2): 2 mg via INTRAVENOUS

## 2021-04-29 MED ORDER — MORPHINE SULFATE (PF) 2 MG/ML IV SOLN
2.0000 mg | INTRAVENOUS | Status: DC | PRN
  Administered 2021-04-30 – 2021-05-01 (×3): 2 mg via INTRAVENOUS
  Filled 2021-04-29 (×3): qty 1

## 2021-04-29 MED ORDER — IODIXANOL 320 MG/ML IV SOLN
INTRAVENOUS | Status: DC | PRN
  Administered 2021-04-29: 10 mL

## 2021-04-29 MED ORDER — LIDOCAINE HCL (PF) 1 % IJ SOLN
INTRAMUSCULAR | Status: DC | PRN
  Administered 2021-04-29: 18 mL

## 2021-04-29 MED ORDER — HEPARIN (PORCINE) IN NACL 1000-0.9 UT/500ML-% IV SOLN
INTRAVENOUS | Status: AC
  Filled 2021-04-29: qty 500

## 2021-04-29 MED ORDER — MIDAZOLAM HCL 2 MG/2ML IJ SOLN
INTRAMUSCULAR | Status: AC
  Filled 2021-04-29: qty 2

## 2021-04-29 MED ORDER — SODIUM CHLORIDE 0.9% FLUSH
3.0000 mL | Freq: Two times a day (BID) | INTRAVENOUS | Status: DC
  Administered 2021-04-29 – 2021-05-01 (×4): 3 mL via INTRAVENOUS

## 2021-04-29 MED ORDER — NALOXONE HCL 0.4 MG/ML IJ SOLN
INTRAMUSCULAR | Status: DC | PRN
  Administered 2021-04-29: .4 mg via INTRAVENOUS

## 2021-04-29 MED ORDER — LIDOCAINE HCL (PF) 1 % IJ SOLN
INTRAMUSCULAR | Status: AC
  Filled 2021-04-29: qty 30

## 2021-04-29 MED ORDER — FENTANYL CITRATE (PF) 100 MCG/2ML IJ SOLN
INTRAMUSCULAR | Status: AC
  Filled 2021-04-29: qty 2

## 2021-04-29 MED ORDER — SODIUM CHLORIDE 0.9% FLUSH
3.0000 mL | INTRAVENOUS | Status: DC | PRN
  Administered 2021-04-30: 3 mL via INTRAVENOUS

## 2021-04-29 MED ORDER — ONDANSETRON HCL 4 MG/2ML IJ SOLN: 4.0000 mg | Freq: Four times a day (QID) | INTRAMUSCULAR | Status: DC | PRN

## 2021-04-29 MED ORDER — OXYCODONE HCL 5 MG PO TABS
5.0000 mg | ORAL_TABLET | ORAL | Status: DC | PRN
  Administered 2021-04-29: 10 mg via ORAL
  Filled 2021-04-29: qty 2

## 2021-04-29 MED ORDER — ASPIRIN EC 81 MG PO TBEC: 81.0000 mg | DELAYED_RELEASE_TABLET | Freq: Every day | ORAL | Status: DC

## 2021-04-29 MED ORDER — FLUMAZENIL 1 MG/10ML IV SOLN
INTRAVENOUS | Status: AC
  Filled 2021-04-29: qty 10

## 2021-04-29 MED ORDER — NALOXONE HCL 0.4 MG/ML IJ SOLN
INTRAMUSCULAR | Status: AC
  Filled 2021-04-29: qty 1

## 2021-04-29 MED ORDER — SODIUM CHLORIDE 0.9 % IV SOLN: INTRAVENOUS | Status: AC

## 2021-04-29 MED ORDER — HEPARIN SODIUM (PORCINE) 1000 UNIT/ML IJ SOLN
INTRAMUSCULAR | Status: AC
  Filled 2021-04-29: qty 1

## 2021-04-29 MED ORDER — HYDRALAZINE HCL 20 MG/ML IJ SOLN: 5.0000 mg | INTRAMUSCULAR | Status: DC | PRN

## 2021-04-29 MED ORDER — LABETALOL HCL 5 MG/ML IV SOLN
10.0000 mg | INTRAVENOUS | Status: DC | PRN
Start: 2021-04-29 — End: 2021-05-01

## 2021-04-29 MED ORDER — FLUMAZENIL 1 MG/10ML IV SOLN
INTRAVENOUS | Status: DC | PRN
  Administered 2021-04-29: .5 mg via INTRAVENOUS

## 2021-04-29 MED ORDER — SODIUM CHLORIDE 0.9 % IV SOLN: 250.0000 mL | INTRAVENOUS | Status: DC | PRN

## 2021-04-29 MED ORDER — HEPARIN SODIUM (PORCINE) 1000 UNIT/ML IJ SOLN
INTRAMUSCULAR | Status: DC | PRN
  Administered 2021-04-29: 7000 [IU] via INTRAVENOUS

## 2021-04-29 MED ORDER — ACETAMINOPHEN 325 MG PO TABS: 650.0000 mg | ORAL_TABLET | ORAL | Status: DC | PRN

## 2021-04-29 MED ORDER — HEPARIN (PORCINE) IN NACL 1000-0.9 UT/500ML-% IV SOLN
INTRAVENOUS | Status: DC | PRN
  Administered 2021-04-29: 500 mL

## 2021-04-29 SURGICAL SUPPLY — 9 items
CATH ANGIO 5F BER2 100CM (CATHETERS) ×2 IMPLANT
CATH RETRIEVER CLOT 16MMX105CM (CATHETERS) ×2 IMPLANT
CATH VISIONS PV .035 IVUS (CATHETERS) ×2 IMPLANT
GLIDEWIRE ADV .035X260CM (WIRE) ×2 IMPLANT
KIT MICROPUNCTURE NIT STIFF (SHEATH) ×2 IMPLANT
SHEATH CLOT RETRIEVER (SHEATH) ×2 IMPLANT
SHEATH PINNACLE 8F 10CM (SHEATH) ×2 IMPLANT
SHEATH PROBE COVER 6X72 (BAG) ×2 IMPLANT
TRAY PV CATH (CUSTOM PROCEDURE TRAY) ×2 IMPLANT

## 2021-04-29 NOTE — Progress Notes (Addendum)
Notified infection prevention, Gordy Councilman advised me that if Dr. Donzetta Matters puts in the note pt tested positive 9/12 at home with no proof in epic then the pt does not need precautions.   Chrisandra Carota, RN 04/29/2021

## 2021-04-29 NOTE — Op Note (Signed)
Patient name: Dylan [name] MRN: [id number] DOB: 10/21/1965 Sex: male  04/29/2021 Pre-operative Diagnosis: Extensive left lower extremity DVT Post-operative diagnosis:  Same with May Thurner syndrome Surgeon:  Eda Paschal. Donzetta Matters, MD Procedure Performed: 1.  Ultrasound-guided cannulation left small saphenous vein 2.  IVC venogram 3.  Intravascular ultrasound left popliteal, left femoral, left common femoral, left external iliac, left common iliac veins and IVC 4.  Mechanical thrombectomy left external iliac, left common femoral, left femoral, left popliteal veins with Inari Clottriever 5.  Moderate sedation with fentanyl and Versed for 41 minutes  Indications: 55 year old male with recent diagnosis of COVID 14 days ago also undergoing treatment for prostate cancer found to have extensive left lower extremity DVT with severe pain.  He is now indicated for the above-noted procedure.  Findings: Small saphenous vein appeared to be dilated with acute thrombus measured approximately 8 mm.  There was clot extending up into the external iliac vein.  There was rouleaux flow through the left common iliac vein and there was a very small lumen at the level of the right common iliac artery crossing consistent with May Thurner syndrome.  Lumen measuring approximately 2 mm.  We performed mechanical thrombectomy returned acute appearing thrombus.  We had planned for stenting.  Unfortunately the patient desaturated to 40% requiring termination of procedure and reversal of moderate sedation with flumazenil and Narcan.  As the procedure was terminated we did not treat the May Thurner we remove clot on 2 separate passes.   Procedure:  The patient was identified in the holding area and taken to room 8.  The patient was then placed prone on the table and prepped and draped in the usual sterile fashion.  A time out was called.  Ultrasound was used to evaluate the small saphenous vein.  Moderate sedation with fentanyl  and Versed was administered.  Patient did have some desaturations initially these recovered.  His saturation probe was on his toe.  We proceeded with the procedure.  We cannulated the small saphenous vein after anesthetizing the area with micropuncture needle followed by wire and sheath.  We then placed a Glidewire advantage this passed easily up to the level of the left common iliac vein.  We placed an 8 [place] sheath.  We able to direct the wire into the IVC and then ultimately into the SVC and right subclavian vein.  We performed intravascular ultrasound from the popliteal all the way to the IVC.  We also performed IVC venogram.  We then elected to perform mechanical thrombectomy.  Patient was given additional 8000 units of heparin on top of his heparin drip.  We then exchanged for an artery sheath.  Will be for mechanical thrombectomy from the external vein down to the popliteal vein on 2 separate passes.  Prior to the second pass a second dose of 100 mcg of fentanyl and 2 mg of Versed was administered given significant discomfort.  With the first pass.  After the second pass the patient was noted to desaturate to 40%.  He was not responding.  We elected to terminate the procedure the bed was brought in he was flipped onto his back in the recovery position.  Flumazenil and Narcan were administered and the patient was then recovered.  He maintained a pulse throughout this event.  Blood pressure was checked after the event was noted to be 135/72 and patient was responding to questions appropriately although repeating himself.  A code had been called no code drugs  were administered.  The sheath had been accidentally removed and transferring the patient back to his bed there was no bleeding there.  We placed a compressive dressing on his left lower extremity.  He was transferred to recovery in stable condition.  Contrast: 10 cc  Maston Wight C. Donzetta Matters, MD [facility] Office:  [phone] Pager: [phone]

## 2021-04-29 NOTE — ED Notes (Signed)
Laying in bed, c/o pain in leg, medicine administered. Pt verbalizes understanding of POC, understands need to remain NPO for procedure this afternoon. Asks for more pain medicine when it comes available. RN emptied urinal. Pt denies further needs besides additional pain medication. Call light in reach, VSS, no distress noted.

## 2021-04-29 NOTE — Progress Notes (Addendum)
  Progress Note    04/29/2021 2:23 PM * No surgery date entered *  Subjective: Pain has been controlled with Dilaudid  Vitals:   04/29/21 1125 04/29/21 1258  BP: 125/75 (!) 129/94  Pulse: 76 80  Resp: 16 16  Temp: 98 F (36.7 C) 97.6 F (36.4 C)  SpO2: 99% 100%    Physical Exam: Awake alert oriented  Nonlabored respirations Left leg with persistent edema pain with palpation, pulses are palpable  CBC    Component Value Date/Time   WBC 6.9 04/29/2021 0453   RBC 3.88 (L) 04/29/2021 0453   HGB 12.4 (L) 04/29/2021 0453   HGB 12.6 (L) 10/04/2014 1444   HCT 37.1 (L) 04/29/2021 0453   HCT 38.7 10/04/2014 1444   PLT 173 04/29/2021 0453   PLT 293 10/04/2014 1444   MCV 95.6 04/29/2021 0453   MCV 85.4 10/04/2014 1444   MCH 32.0 04/29/2021 0453   MCHC 33.4 04/29/2021 0453   RDW 13.6 04/29/2021 0453   RDW 14.0 10/04/2014 1444   LYMPHSABS 0.7 04/28/2021 1336   LYMPHSABS 2.6 10/04/2014 1444   MONOABS 0.8 04/28/2021 1336   MONOABS 0.5 10/04/2014 1444   EOSABS 0.1 04/28/2021 1336   EOSABS 0.1 10/04/2014 1444   BASOSABS 0.0 04/28/2021 1336   BASOSABS 0.0 10/04/2014 1444    BMET    Component Value Date/Time   NA 136 04/29/2021 0453   NA 140 09/28/2014 1438   K 3.4 (L) 04/29/2021 0453   K 4.3 09/28/2014 1438   CL 96 (L) 04/29/2021 0453   CO2 30 04/29/2021 0453   CO2 25 09/28/2014 1438   GLUCOSE 107 (H) 04/29/2021 0453   GLUCOSE 134 09/28/2014 1438   BUN 11 04/29/2021 0453   BUN 27.4 (H) 10/04/2014 1445   CREATININE 0.93 04/29/2021 0453   CREATININE 1.14 05/29/2016 1157   CREATININE 1.6 (H) 10/04/2014 1445   CALCIUM 8.5 (L) 04/29/2021 0453   CALCIUM 9.5 09/28/2014 1438   GFRNONAA >60 04/29/2021 0453   GFRAA >60 01/13/2019 1552    INR    Component Value Date/Time   INR 1.1 04/26/2021 2020     Intake/Output Summary (Last 24 hours) at 04/29/2021 1423 Last data filed at 04/29/2021 0758 Gross per 24 hour  Intake 1261.54 ml  Output 600 ml  Net 661.54 ml      Assessment:  55 y.o. male is here with extensive left lower extremity DVT.  Plan: PV lab today for left lower extremity venous thrombectomy possible stenting  We discussed the risk and benefits as well as alternatives.  We discussed anticoagulation as the alternative and the risks of the surgery.  Primary risk would be injury to surrounding structures including popliteal artery and nerve, risk of blood loss requiring blood transfusion, risk of pulmonary embolus, risk of treatment failure with rethrombosis and/or stent thrombosis.  He demonstrates good understanding.  Niamh Rada C. Donzetta Matters, MD [facility] [place] Office: [phone] Pager: [phone]  04/29/2021 2:23 PM

## 2021-04-29 NOTE — Progress Notes (Signed)
    Last noted positive home test 9/14 and discussed with telehealth at the time.  There is no documentation of the test but his wife was reportedly also symptomatic and tested positive.  At this time he has no symptoms.  His chest x-ray is clear.  He is outside of the 10-day window.  We will not proceed with COVID precautions at this time.  Nastassja Witkop C. Donzetta Matters, MD [facility] [place] Office: [phone] Pager: [phone]

## 2021-04-29 NOTE — Progress Notes (Addendum)
[place] for Heparin Indication: DVT  Allergies  Allergen Reactions   Crestor [Rosuvastatin] Other (See Comments)    Severe muscle pain and nausea   Zofran [Ondansetron Hcl] Nausea And Vomiting   Ace Inhibitors Cough   Lipitor [Atorvastatin] Other (See Comments)    myalgia   Codeine Itching    Patient Measurements: Height: 6\' 1"  (185.4 cm) Weight: 113.4 kg (250 lb) IBW/kg (Calculated) : 79.9 Heparin Dosing Weight: 103.9 kg  Vital Signs: BP: 101/74 (09/26 0700) Pulse Rate: 65 (09/26 0700)  Labs: Recent Labs    04/26/21 2020 04/28/21 1336 04/28/21 2043 04/28/21 2228 04/29/21 0453  HGB 13.1 14.2  --   --  12.4*  HCT 38.0* 41.3  --   --  37.1*  PLT 174 218  --   --  173  APTT 28  --  104* 83* 68*  LABPROT 14.3  --   --   --   --   INR 1.1  --   --   --   --   HEPARINUNFRC  --   --  >1.10*  --  >1.10*  CREATININE 0.92 1.03  --   --  0.93     Estimated Creatinine Clearance: 117 mL/min (by C-G formula based on SCr of 0.93 mg/dL).   Medical History: Past Medical History:  Diagnosis Date   Arthritis    CAD (coronary artery disease) 07/2010; 07/2012; 08/2014   cardiologist--- dr croitoru;  1) 11-28-'11: NSTEMI - 100% RCA - PCI (3 Taxus ION DES 3.0 x 28, 3.0 x 24 & 3.5 x 12 distal-prox); b) 12/ 2013 ISR of RCA stent - -AnngioSculpt PTCA; c) NSTEMI 1/'16: mild ISR with Thrombosis of RCA Stents (in setting of Mesenteric Vein Thrombosis) - Cutting Balloon PTCA; c. Re-look Cath 07/2015 & 07/2016: ~15% ISR in RCA,20% p-mLAD & o-pCx.   Chronic gout    01-07-2021  last flare-up approxl 12-17-2020 bilteral great toes   DM type 2 (diabetes mellitus, type 2) ([place])    followed by pcp   (01-07-2021 per pt does not check blood sugar at home)   Edema of both lower extremities    01-07-2021  pt states wears compression hose   Essential hypertension    followed by pcp and cardiology   History of concussion    01-07-2021  per pt x4 as child,  last one age 7 without residual   History of CVA with residual deficit 03/17/4817   embolic left MCA infarct post PCI stenting approx one hour  (01-07-2021 per pt has residual minimal intermittant aphasia )   History of non-ST elevation myocardial infarction (NSTEMI) 07-01-2010  and 01/ 2016   History of prostatitis    History of resection of small bowel 08/28/2014   small bowel perforation,  s/p resction    History of retinal tear 04/2013   right micro-rupture , no occlusion, retinal artery branch w/ vision loss,  per imaging no infarct/ occlusion   History of thrombosis 08/2014   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, provoked by testosterone injection,  08-09-2014 s/p thrombolytic lysis VIR    Hyperlipidemia with target LDL less than 70 09/01/2014   Nocturia    PAC (premature atrial contraction)    pt hx palpitations, event monitor 08-18-2017  showed NS w/ occasional PACs, rare PVCs   Prostate cancer [facility]) urologist-- dr herrick/  oncologist-- dr Tammi Klippel   dx 09-04-2020,  Stage T1c, Gleason 4+3  S/P drug eluting coronary stent placement 07/01/2010   DES x3  to prox and distal RCA   S/P percutaneous transluminal coronary angioplasty 12/ 2013 and 01/ 2016   for ISR  RCA    Medications:  (Not in a hospital admission)  Scheduled:   aspirin EC  81 mg Oral Daily   carvedilol  12.5 mg Oral BID   empagliflozin  25 mg Oral Daily   irbesartan  300 mg Oral Daily   metFORMIN  1,000 mg Oral Q breakfast   pantoprazole  40 mg Oral Daily   Infusions:   sodium chloride 75 mL/hr at 04/28/21 1900   heparin 1,700 Units/hr (04/29/21 0326)   PRN:   Assessment: 55 yom with a history of CAD, diabetes, prior thrombosis. Patient is presenting with extensive DVT. Patient recently diagnosed with DVT and prescribed Xarelto with plan for return to vascular outpatient and IVC iliac duplex. Patient returning today with 10/10 pain. Heparin per pharmacy consult placed for  DVT.  Patient is on Xarelto prior to arrival. This was started two days ago at [facility] ED. Last dose 9/25am. Will require aPTT monitoring due to likely falsely high anti-Xa level secondary to DOAC use.  Heparin level came back undetectably high as expected with recent DOAC, aPTT came back therapeutic at 68 (previous aPTT 83), on 1700 units/hr.  Goal of Therapy: Heparin level 0.3-0.7 units/ml Heparin level 66-102 units/ml Monitor platelets by anticoagulation protocol: Yes   Plan: Given that aPTT slightly downtrending but remains within goal, will increase heparin infusion at 1800 units/hr (~1 u/kg/hr increase) Monitor heparin level in 6 hours  Monitor for s/sx of bleeding, CBC, daily HL  Dylan [name], PharmD, [facility] Emergency Medicine Clinical Pharmacist ED RPh Phone: [place]: [phone]

## 2021-04-29 NOTE — Progress Notes (Signed)
Patient c/o numbness left foot and toes on left foot. Left foot cool and pale. Dr. Donzetta Matters in and removed ACE wrap from left leg. Patient immediately stated that foot felt better. Left DP dopplered; color pink.

## 2021-04-29 NOTE — Progress Notes (Signed)
Pt arrived from ED, VSS, CHG complete, oriented to unit, call light within reach, will continue to monitor.   Chrisandra Carota, RN 04/29/2021

## 2021-04-29 NOTE — Progress Notes (Signed)
Pt returned from cath lab,VSS, call light within reach, will continue to monitor.  Chrisandra Carota, RN

## 2021-04-29 NOTE — ED Notes (Signed)
Admitting paged to RN per her request 

## 2021-04-29 NOTE — Progress Notes (Signed)
ANTICOAGULATION CONSULT NOTE - Follow Up Consult  Pharmacy Consult for IV Heparin Indication: DVT  Allergies  Allergen Reactions   Crestor [Rosuvastatin] Other (See Comments)    Severe muscle pain and nausea   Zofran [Ondansetron Hcl] Nausea And Vomiting   Ace Inhibitors Cough   Lipitor [Atorvastatin] Other (See Comments)    myalgia   Codeine Itching    Patient Measurements: Height: 6\' 1"  (185.4 cm) Weight: 113.4 kg (250 lb) IBW/kg (Calculated) : 79.9 Heparin Dosing Weight: 103.9 kg  Vital Signs: Temp: 98.6 F (37 C) (09/26 1500) Temp Source: Oral (09/26 1500) BP: 119/83 (09/26 1500) Pulse Rate: 76 (09/26 1500)  Labs: Recent Labs    04/26/21 2020 04/28/21 1336 04/28/21 2043 04/28/21 2228 04/29/21 0453 04/29/21 1436  HGB 13.1 14.2  --   --  12.4*  --   HCT 38.0* 41.3  --   --  37.1*  --   PLT 174 218  --   --  173  --   APTT 28  --  104* 83* 68* 75*  LABPROT 14.3  --   --   --   --   --   INR 1.1  --   --   --   --   --   HEPARINUNFRC  --   --  >1.10*  --  >1.10*  --   CREATININE 0.92 1.03  --   --  0.93  --     Estimated Creatinine Clearance: 117 mL/min (by C-G formula based on SCr of 0.93 mg/dL).  Medical History: Past Medical History:  Diagnosis Date   Arthritis    CAD (coronary artery disease) 07/2010; 07/2012; 08/2014   cardiologist--- dr croitoru;  1) 11-28-'11: NSTEMI - 100% RCA - PCI (3 Taxus ION DES 3.0 x 28, 3.0 x 24 & 3.5 x 12 distal-prox); b) 12/ 2013 ISR of RCA stent - -AnngioSculpt PTCA; c) NSTEMI 1/'16: mild ISR with Thrombosis of RCA Stents (in setting of Mesenteric Vein Thrombosis) - Cutting Balloon PTCA; c. Re-look Cath 07/2015 & 07/2016: ~15% ISR in RCA,20% p-mLAD & o-pCx.   Chronic gout    01-07-2021  last flare-up approxl 12-17-2020 bilteral great toes   DM type 2 (diabetes mellitus, type 2) ([place])    followed by pcp   (01-07-2021 per pt does not check blood sugar at home)   Edema of both lower extremities    01-07-2021  pt states wears  compression hose   Essential hypertension    followed by pcp and cardiology   History of concussion    01-07-2021  per pt x4 as child, last one age 61 without residual   History of CVA with residual deficit 63/78/5885   embolic left MCA infarct post PCI stenting approx one hour  (01-07-2021 per pt has residual minimal intermittant aphasia )   History of non-ST elevation myocardial infarction (NSTEMI) 07-01-2010  and 01/ 2016   History of prostatitis    History of resection of small bowel 08/28/2014   small bowel perforation,  s/p resction    History of retinal tear 04/2013   right micro-rupture , no occlusion, retinal artery branch w/ vision loss,  per imaging no infarct/ occlusion   History of thrombosis 08/2014   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, provoked by testosterone injection,  08-09-2014 s/p thrombolytic lysis VIR    Hyperlipidemia with target LDL less than 70 09/01/2014   Nocturia    PAC (premature atrial contraction)  pt hx palpitations, event monitor 08-18-2017  showed NS w/ occasional PACs, rare PVCs   Prostate cancer [facility]) urologist-- dr herrick/  oncologist-- dr Tammi Klippel   dx 09-04-2020,  Stage T1c, Gleason 4+3    S/P drug eluting coronary stent placement 07/01/2010   DES x3  to prox and distal RCA   S/P percutaneous transluminal coronary angioplasty 12/ 2013 and 01/ 2016   for ISR  RCA    Assessment: 55 yr old man with hx of CAD, DM, prior thrombosis presented with extensive LLE DVT. Patient was recently diagnosed with DVT and prescribed Xarelto with plan for return to vascular outpatient clinic and IVC iliac duplex. Patient returned with 10/10 pain. Pharmacy was consulted for IV heparin for DVT (pt on Xarelto PTA, started 2 days PTA at ED visit, last dose 9/25 AM). Given recent DOAC exposure, will monitor anticoagulation using aPTT until aPTT and heparin levels correlate.  aPTT ~7 hrs after heparin infusion was increased to 1800 units/hr was 75  sec, which is within the goal range for this pt. Heparin level earlier today was >1.10 units/ml, indicating that Xarelto is influencing heparin level. H/H 12.4/37.1, plt 173.   Pt currently undergoing LLE venous thrombectomy; per RN, heparin infusion is continuing during procedure, no bleeding issues observed.  Goal of Therapy:  Heparin level 0.3-0.7 units/ml aPTT 66-102 sec Monitor platelets by anticoagulation protocol: Yes   Plan:  Continue heparin infusion at 1800 units/hr Check confirmatory heparin level in 6 hrs Monitor daily heparin level, CBC Monitor for bleeding F/U long-term oral anticoagulant plan  Gillermina Hu, PharmD, BCPS, [facility] Clinical Pharmacist

## 2021-04-29 NOTE — ED Notes (Signed)
Paged Dr. Virl Cagey @ [phone] per RN request

## 2021-04-30 ENCOUNTER — Inpatient Hospital Stay (HOSPITAL_COMMUNITY): Payer: 59

## 2021-04-30 ENCOUNTER — Encounter (HOSPITAL_COMMUNITY): Payer: Self-pay | Admitting: Vascular Surgery

## 2021-04-30 DIAGNOSIS — I272 Pulmonary hypertension, unspecified: Secondary | ICD-10-CM

## 2021-04-30 DIAGNOSIS — I824Y2 Acute embolism and thrombosis of unspecified deep veins of left proximal lower extremity: Secondary | ICD-10-CM | POA: Diagnosis not present

## 2021-04-30 DIAGNOSIS — I2699 Other pulmonary embolism without acute cor pulmonale: Secondary | ICD-10-CM

## 2021-04-30 DIAGNOSIS — I2693 Single subsegmental pulmonary embolism without acute cor pulmonale: Secondary | ICD-10-CM | POA: Diagnosis not present

## 2021-04-30 DIAGNOSIS — I2510 Atherosclerotic heart disease of native coronary artery without angina pectoris: Secondary | ICD-10-CM

## 2021-04-30 DIAGNOSIS — I82402 Acute embolism and thrombosis of unspecified deep veins of left lower extremity: Secondary | ICD-10-CM

## 2021-04-30 LAB — BASIC METABOLIC PANEL
Anion gap: 11 (ref 5–15)
BUN: 11 mg/dL (ref 6–20)
CO2: 28 mmol/L (ref 22–32)
Calcium: 8.4 mg/dL — ABNORMAL LOW (ref 8.9–10.3)
Chloride: 98 mmol/L (ref 98–111)
Creatinine, Ser: 0.84 mg/dL (ref 0.61–1.24)
GFR, Estimated: >60 mL/min (ref >60)
Glucose, Bld: 94 mg/dL (ref 70–99)
Potassium: 3.3 mmol/L — ABNORMAL LOW (ref 3.5–5.1)
Sodium: 137 mmol/L (ref 135–145)

## 2021-04-30 LAB — APTT: aPTT: 100 seconds — ABNORMAL HIGH (ref 24–36)

## 2021-04-30 LAB — ECHOCARDIOGRAM COMPLETE
AR max vel: 3.87 cm2
AV Area VTI: 4.15 cm2
AV Area mean vel: 3.62 cm2
AV Mean grad: 4 mmHg
AV Peak grad: 6.8 mmHg
Ao pk vel: 1.3 m/s
Area-P 1/2: 4.6 cm2
Calc EF: 58.7 %
Height: 73 in
MV VTI: 5.51 cm2
S' Lateral: 3.3 cm
Single Plane A2C EF: 60.1 %
Single Plane A4C EF: 54.5 %
Weight: 4000.03 oz

## 2021-04-30 LAB — CBC
HCT: 33.8 % — ABNORMAL LOW (ref 39.0–52.0)
Hemoglobin: 11.3 g/dL — ABNORMAL LOW (ref 13.0–17.0)
MCH: 31.7 pg (ref 26.0–34.0)
MCHC: 33.4 g/dL (ref 30.0–36.0)
MCV: 94.7 fL (ref 80.0–100.0)
Platelets: 169 10*3/uL (ref 150–400)
RBC: 3.57 MIL/uL — ABNORMAL LOW (ref 4.22–5.81)
RDW: 13.2 % (ref 11.5–15.5)
WBC: 6 10*3/uL (ref 4.0–10.5)
nRBC: 0 % (ref 0.0–0.2)

## 2021-04-30 LAB — HEPARIN LEVEL (UNFRACTIONATED): Heparin Unfractionated: 0.76 IU/mL — ABNORMAL HIGH (ref 0.30–0.70)

## 2021-04-30 LAB — D-DIMER, QUANTITATIVE: D-Dimer, Quant: 4.04 ug/mL-FEU — ABNORMAL HIGH (ref 0.00–0.50)

## 2021-04-30 MED ORDER — IOHEXOL 350 MG/ML SOLN
100.0000 mL | Freq: Once | INTRAVENOUS | Status: AC
  Administered 2021-04-30: 100 mL via INTRAVENOUS

## 2021-04-30 MED ORDER — POTASSIUM CHLORIDE CRYS ER 20 MEQ PO TBCR
40.0000 meq | EXTENDED_RELEASE_TABLET | Freq: Every day | ORAL | Status: DC
  Administered 2021-04-30 – 2021-05-01 (×2): 40 meq via ORAL
  Filled 2021-04-30: qty 2

## 2021-04-30 MED ORDER — KETOROLAC TROMETHAMINE 30 MG/ML IJ SOLN
30.0000 mg | Freq: Four times a day (QID) | INTRAMUSCULAR | Status: DC
  Administered 2021-04-30: 30 mg via INTRAVENOUS
  Filled 2021-04-30: qty 1

## 2021-04-30 MED ORDER — POTASSIUM CHLORIDE CRYS ER 20 MEQ PO TBCR
40.0000 meq | EXTENDED_RELEASE_TABLET | Freq: Once | ORAL | Status: AC
  Administered 2021-04-30: 40 meq via ORAL
  Filled 2021-04-30: qty 2

## 2021-04-30 NOTE — Progress Notes (Signed)
ANTICOAGULATION CONSULT NOTE - Follow Up Consult  Pharmacy Consult for IV Heparin Indication: DVT  Allergies  Allergen Reactions   Crestor [Rosuvastatin] Other (See Comments)    Severe muscle pain and nausea   Zofran [Ondansetron Hcl] Nausea And Vomiting   Ace Inhibitors Cough   Lipitor [Atorvastatin] Other (See Comments)    myalgia   Codeine Itching    Patient Measurements: Height: 6\' 1"  (185.4 cm) Weight: 113.4 kg (250 lb) IBW/kg (Calculated) : 79.9 Heparin Dosing Weight: 103.9 kg  Vital Signs: Temp: 98.4 F (36.9 C) (09/26 2226) Temp Source: Oral (09/26 2226) BP: 131/84 (09/27 0000) Pulse Rate: 81 (09/27 0000)  Labs: Recent Labs    04/28/21 1336 04/28/21 2043 04/28/21 2228 04/29/21 0453 04/29/21 1436 04/30/21 0016  HGB 14.2  --   --  12.4*  --  11.3*  HCT 41.3  --   --  37.1*  --  33.8*  PLT 218  --   --  173  --  169  APTT  --  104*   < > 68* 75* 100*  HEPARINUNFRC  --  >1.10*  --  >1.10*  --  0.76*  CREATININE 1.03  --   --  0.93  --   --    < > = values in this interval not displayed.    Estimated Creatinine Clearance: 117 mL/min (by C-G formula based on SCr of 0.93 mg/dL).  Medical History: Past Medical History:  Diagnosis Date   Arthritis    CAD (coronary artery disease) 07/2010; 07/2012; 08/2014   cardiologist--- dr croitoru;  1) 11-28-'11: NSTEMI - 100% RCA - PCI (3 Taxus ION DES 3.0 x 28, 3.0 x 24 & 3.5 x 12 distal-prox); b) 12/ 2013 ISR of RCA stent - -AnngioSculpt PTCA; c) NSTEMI 1/'16: mild ISR with Thrombosis of RCA Stents (in setting of Mesenteric Vein Thrombosis) - Cutting Balloon PTCA; c. Re-look Cath 07/2015 & 07/2016: ~15% ISR in RCA,20% p-mLAD & o-pCx.   Chronic gout    01-07-2021  last flare-up approxl 12-17-2020 bilteral great toes   DM type 2 (diabetes mellitus, type 2) ([place])    followed by pcp   (01-07-2021 per pt does not check blood sugar at home)   Edema of both lower extremities    01-07-2021  pt states wears compression hose    Essential hypertension    followed by pcp and cardiology   History of concussion    01-07-2021  per pt x4 as child, last one age 36 without residual   History of CVA with residual deficit 65/68/1275   embolic left MCA infarct post PCI stenting approx one hour  (01-07-2021 per pt has residual minimal intermittant aphasia )   History of non-ST elevation myocardial infarction (NSTEMI) 07-01-2010  and 01/ 2016   History of prostatitis    History of resection of small bowel 08/28/2014   small bowel perforation,  s/p resction    History of retinal tear 04/2013   right micro-rupture , no occlusion, retinal artery branch w/ vision loss,  per imaging no infarct/ occlusion   History of thrombosis 08/2014   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, provoked by testosterone injection,  08-09-2014 s/p thrombolytic lysis VIR    Hyperlipidemia with target LDL less than 70 09/01/2014   Nocturia    PAC (premature atrial contraction)    pt hx palpitations, event monitor 08-18-2017  showed NS w/ occasional PACs, rare PVCs   Prostate cancer [facility]) urologist-- dr herrick/  oncologist-- dr Tammi Klippel   dx 09-04-2020,  Stage T1c, Gleason 4+3    S/P drug eluting coronary stent placement 07/01/2010   DES x3  to prox and distal RCA   S/P percutaneous transluminal coronary angioplasty 12/ 2013 and 01/ 2016   for ISR  RCA    Assessment: 55 yr old man with hx of CAD, DM, prior thrombosis presented with extensive LLE DVT. Patient was recently diagnosed with DVT and prescribed Xarelto with plan for return to vascular outpatient clinic and IVC iliac duplex. Patient returned with 10/10 pain. Pharmacy was consulted for IV heparin for DVT (pt on Xarelto PTA, started 2 days PTA at ED visit, last dose 9/25 AM). Given recent DOAC exposure, will monitor anticoagulation using aPTT until aPTT and heparin levels correlate.  aPTT ~7 hrs after heparin infusion was increased to 1800 units/hr was 75 sec, which is within  the goal range for this pt. Heparin level earlier today was >1.10 units/ml, indicating that Xarelto is influencing heparin level. H/H 12.4/37.1, plt 173.   Pt currently undergoing LLE venous thrombectomy; per RN, heparin infusion is continuing during procedure, no bleeding issues observed.  PTT is almost correlating with heparin level. It came back therapeutic tonight at 100. We will continue at the same rate.   Goal of Therapy:  Heparin level 0.3-0.7 units/ml aPTT 66-102 sec Monitor platelets by anticoagulation protocol: Yes   Plan:  Continue heparin infusion at 1800 units/hr Monitor daily heparin level, PTT, CBC Monitor for bleeding F/U long-term oral anticoagulant plan  Onnie Boer, PharmD, BCIDP, AAHIVP, CPP Infectious Disease Pharmacist 04/30/2021 1:18 AM

## 2021-04-30 NOTE — Progress Notes (Signed)
*  PRELIMINARY RESULTS* Echocardiogram 2D Echocardiogram has been performed.  Dylan [name] 04/30/2021, 3:16 PM

## 2021-04-30 NOTE — Progress Notes (Signed)
PT refused second dose of Toradol this afternoon due to increased amount of sweating after the first dose. VSS, will continue to monitor.   Chrisandra Carota, RN

## 2021-04-30 NOTE — Progress Notes (Signed)
Pt is alert and fully oriented, hemodynamically stable. He remains afebrile. SPO2 on room air 88%. O2 NCL 2 LPM is given. Lungs are clear ausculted bilaterally. He has pain scale 7-10/10. Pt prefers Dilaudid 2 mg. He stated Morphine and Oxycodone did not help with his severe pain. DP and PT are 2+palpable bilaterally. Incision on left knee is dry and clean, no bleeding or hematoma. We will continue to monitor.  Kennyth Lose, RN

## 2021-04-30 NOTE — Progress Notes (Addendum)
[facility] [place]  Subjective  - Still requiring O2 4 L [place].  No other complaints.   Objective 139/73 98 99.6 F (37.6 C) (Oral) 14 94%  Intake/Output Summary (Last 24 hours) at 04/30/2021 0730 Last data filed at 04/30/2021 0600 Gross per 24 hour  Intake 2674.74 ml  Output 1670 ml  Net 1004.74 ml    B DP palpable pulses Left posterior popliteal dressing clean and dry Heart RRR T m 99.6 Lungs O2 [place] 4 L non labored breathing  Assessment/Planning: POD # 1  Procedure Performed: 1.  Ultrasound-guided cannulation left small saphenous vein 2.  IVC venogram 3.  Intravascular ultrasound left popliteal, left femoral, left common femoral, left external iliac, left common iliac veins and IVC 4.  Mechanical thrombectomy left external iliac, left common femoral, left femoral, left popliteal veins with Inari Clottriever  The patient desaturated to 40% requiring termination of procedure and reversal of moderate sedation with flumazenil and Narcan.  He is requiring 4 L.  He Has a history of COVID, but has never required O2 before this. He states that a few days before he came to the ED he was SOB and his O2 SAT dropped at home several times to 70.   Chest x ray FINDINGS: Portable AP semi upright view at 0936 hours. Mildly improved lung volumes. Stable, normal cardiac size and mediastinal contours. Visualized tracheal air column is within normal limits. Allowing for portable technique the lungs are clear. No pneumothorax or pleural effusion. No acute osseous abnormality identified.   He is requiring Morphine, Dilaudid and oxycodone for pain control.  I will give him Toradol to try and alleviate the pain.    He also has a history og gout that could be considered.   IMPRESSION: Negative portable chest.  Hypokalemia will replace Uric acid lab ordered for am and ted hose knee high placed on leftr LE  Will order CTA of the chest and Echo.  Remote possibility of  PE or heart evolvement.  ADD: CTA chest FINDINGS: Cardiovascular: Satisfactory opacification of the pulmonary arteries to the segmental level. There is a large burden of pulmonary embolus in the distal right and right-sided lobar branch pulmonary arteries (series 6, image 133). There is subsegmental embolus noted in the left lower lobe (series 6, image 151). Global cardiomegaly. Three-vessel coronary artery calcifications. Mild enlargement of the main pulmonary artery measuring up to 3.4 cm. RV LV ratio is mildly elevated, 1.0. No pericardial effusion.   Mediastinum/Nodes: No enlarged mediastinal, hilar, or axillary lymph nodes. Thyroid gland, trachea, and esophagus demonstrate no significant findings.   Lungs/Pleura: Lungs are clear. No pleural effusion or pneumothorax.   Upper Abdomen: No acute abnormality.   Musculoskeletal: No chest wall abnormality. No acute or significant osseous findings.   Review of the MIP images confirms the above findings.   IMPRESSION: 1. Large burden of pulmonary embolus in the distal right and right-sided lobar branch pulmonary arteries. 2. Subsegmental embolus noted in the left lower lobe; presumably the majority of left-sided embolus was extracted during embolectomy procedure. 3. Comparison to prior imaging, if available, would be helpful to assess for change in embolus burden. 4. Global cardiomegaly with three-vessel coronary artery calcifications. 5. Mild enlargement of the main pulmonary artery measuring up to 3.4 cm, as can be seen in pulmonary hypertension. RV LV ratio is mildly elevated, 1.0, concerning for right heart strain. Correlate with echocardiographic findings.    Roxy Horseman 04/30/2021 7:30 AM --  Laboratory Lab  Results: Recent Labs    04/29/21 0453 04/30/21 0016  WBC 6.9 6.0  HGB 12.4* 11.3*  HCT 37.1* 33.8*  PLT 173 169   BMET Recent Labs    04/29/21 0453 04/30/21 0016  NA 136 137  K 3.4* 3.3*   CL 96* 98  CO2 30 28  GLUCOSE 107* 94  BUN 11 11  CREATININE 0.93 0.84  CALCIUM 8.5* 8.4*    COAG Lab Results  Component Value Date   INR 1.1 04/26/2021   INR 1.0 01/08/2021   INR 1.10 07/23/2016   No results found for: PTT    I have interviewed and examined patient with PA and agree with assessment and plan above.  Desaturations during mechanical thrombectomy of his left lower extremity yesterday.  He now has large volume PE which was likely present prior to our procedure possibly some component is from the procedure as well.  He is satting 100% on nasal cannula.  Appreciate the input of pulmonary critical care, he would be okay for thrombolysis from a vascular standpoint given that we accessed his small saphenous vein which was thrombosed and would be at low bleeding risk from this.  He is on heparin drip we will plan to transition back to Xarelto prior to discharge.  If he recovers from his PE in the next couple weeks we can consider repeat thrombectomy and stenting of his left common iliac vein for May Thurner syndrome.  Given history of portal vein thrombus he would likely benefit from thrombophilia work-up in the future.  Fergus Throne C. Donzetta Matters, MD [facility] Office: [phone] Pager: [phone]

## 2021-04-30 NOTE — Consult Note (Signed)
NAME:  Dylan [name], MRN:  [id number], DOB:  [birth year], LOS: 1 ADMISSION DATE:  04/28/2021, CONSULTATION DATE: 04/30/2021 REFERRING MD: Dr. Raliegh Ip, CHIEF COMPLAINT: Hypoxia in the setting of pulmonary embolism with desaturation during sedation for thrombectomy.  History of Present Illness:  55 year old who has extensive past medical history as noted below who came in for a left thrombectomy and while on the operating table in the prone position desaturated.  He is got a recent history of COVID.  Follow-up CT scan post URS showed bilateral pulmonary embolism.  Pulmonary critical care was asked to evaluate.  He is found to be hemodynamically stable in no acute distress at rest.  Question to pulmonary as thrombolytics needed at this time.  We await 2D echo and note that vascular surgery says thrombolytics not contraindicated at this point.  Pertinent  Medical History   Past Medical History:  Diagnosis Date   Arthritis    CAD (coronary artery disease) 07/2010; 07/2012; 08/2014   cardiologist--- dr croitoru;  1) 11-28-'11: NSTEMI - 100% RCA - PCI (3 Taxus ION DES 3.0 x 28, 3.0 x 24 & 3.5 x 12 distal-prox); b) 12/ 2013 ISR of RCA stent - -AnngioSculpt PTCA; c) NSTEMI 1/'16: mild ISR with Thrombosis of RCA Stents (in setting of Mesenteric Vein Thrombosis) - Cutting Balloon PTCA; c. Re-look Cath 07/2015 & 07/2016: ~15% ISR in RCA,20% p-mLAD & o-pCx.   Chronic gout    01-07-2021  last flare-up approxl 12-17-2020 bilteral great toes   DM type 2 (diabetes mellitus, type 2) ([place])    followed by pcp   (01-07-2021 per pt does not check blood sugar at home)   Edema of both lower extremities    01-07-2021  pt states wears compression hose   Essential hypertension    followed by pcp and cardiology   History of concussion    01-07-2021  per pt x4 as child, last one age 59 without residual   History of CVA with residual deficit 62/56/3893   embolic left MCA infarct post PCI stenting approx one hour   (01-07-2021 per pt has residual minimal intermittant aphasia )   History of non-ST elevation myocardial infarction (NSTEMI) 07-01-2010  and 01/ 2016   History of prostatitis    History of resection of small bowel 08/28/2014   small bowel perforation,  s/p resction    History of retinal tear 04/2013   right micro-rupture , no occlusion, retinal artery branch w/ vision loss,  per imaging no infarct/ occlusion   History of thrombosis 08/2014   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, provoked by testosterone injection,  08-09-2014 s/p thrombolytic lysis VIR    Hyperlipidemia with target LDL less than 70 09/01/2014   Nocturia    PAC (premature atrial contraction)    pt hx palpitations, event monitor 08-18-2017  showed NS w/ occasional PACs, rare PVCs   Prostate cancer [facility] urologist-- dr herrick/  oncologist-- dr Tammi Klippel   dx 09-04-2020,  Stage T1c, Gleason 4+3    S/P drug eluting coronary stent placement 07/01/2010   DES x3  to prox and distal RCA   S/P percutaneous transluminal coronary angioplasty 12/ 2013 and 01/ 2016   for ISR  RCA     Significant Hospital Events: Including procedures, antibiotic start and stop dates in addition to other pertinent events     Interim History / Subjective:  Currently awake alert no acute distress  Objective   Blood pressure 139/73, pulse 98, temperature 99.6  F (37.6 C), temperature source Oral, resp. rate 14, height 6\' 1"  (1.854 m), weight 113.4 kg, SpO2 94 %.        Intake/Output Summary (Last 24 hours) at 04/30/2021 1148 Last data filed at 04/30/2021 1448 Gross per 24 hour  Intake 1531.2 ml  Output 1070 ml  Net 461.2 ml   Filed Weights   04/29/21 0700  Weight: 113.4 kg    Examination: General: Well-nourished well-developed male no acute distress HENT: No JVD or lymphadenopathy is appreciated Lungs: Decreased breath sounds in the base Cardiovascular: Heart sounds are regular regular rate rhythm Abdomen: Soft  nontender  Extremities: left lower extremity with 2+ swelling warm to touch Neuro: Grossly intact without focal defect GU: [facility] Problem list     Assessment & Plan:  Status post acute hypoxia during surgical interventions for thrombectomy on left came acutely hypoxic and required coming into the prone position ventilated near code situation but did not require ACLS drugs.  This is likely multifactorial suspect he had a PE prior to admission.  Has a history of recent COVID which may have exacerbated his hypoxia CT scan of the chest is remarkable bilateral PEs. 2D echo to determine RV strain and the need if you need for thrombolytics no vascular surgeon reports no counter indications to thrombolytics at this time. Lower extremity Doppler study Continue heparin I presume it vascular surgery will again attempt to do the thrombectomy therefore leave the heparin going at this time.  Illnesses sent home at which time he can transition to oral agent.  Extensive cardiovascular disease Monitored in stepdown unit   Diabetic CBG (last 3)  Recent Labs    04/29/21 1253 04/29/21 1754 04/29/21 2115  GLUCAP 76 116* 136*   Sliding-scale insulin protocol  Best Practice (right click and "Reselect all SmartList Selections" daily)   Diet/type: NPO DVT prophylaxis: systemic heparin GI prophylaxis: PPI Lines: N/A Foley:  N/A Code Status:  full code Last date of multidisciplinary goals of care discussion [per primary]  Labs   CBC: Recent Labs  Lab 04/26/21 2020 04/28/21 1336 04/29/21 0453 04/30/21 0016  WBC 4.7 7.7 6.9 6.0  NEUTROABS 3.1 6.1  --   --   HGB 13.1 14.2 12.4* 11.3*  HCT 38.0* 41.3 37.1* 33.8*  MCV 92.2 93.4 95.6 94.7  PLT 174 218 173 185    Basic Metabolic Panel: Recent Labs  Lab 04/26/21 2020 04/28/21 1336 04/29/21 0453 04/30/21 0016  NA 139 137 136 137  K 3.1* 3.1* 3.4* 3.3*  CL 97* 95* 96* 98  CO2 31 32 30 28  GLUCOSE 104* 162* 107* 94  BUN  20 12 11 11   CREATININE 0.92 1.03 0.93 0.84  CALCIUM 9.6 8.8* 8.5* 8.4*   GFR: Estimated Creatinine Clearance: 129.6 mL/min (by C-G formula based on SCr of 0.84 mg/dL). Recent Labs  Lab 04/26/21 2020 04/28/21 1336 04/29/21 0453 04/30/21 0016  WBC 4.7 7.7 6.9 6.0    Liver Function Tests: No results for input(s): AST, ALT, ALKPHOS, BILITOT, PROT, ALBUMIN in the last 168 hours. No results for input(s): LIPASE, AMYLASE in the last 168 hours. No results for input(s): AMMONIA in the last 168 hours.  ABG    Component Value Date/Time   TCO2 24 07/01/2010 0652     Coagulation Profile: Recent Labs  Lab 04/26/21 2020  INR 1.1    Cardiac Enzymes: No results for input(s): CKTOTAL, CKMB, CKMBINDEX, TROPONINI in the last 168 hours.  HbA1C: Hgb A1c MFr  Bld  Date/Time Value Ref Range Status  07/23/2016 04:41 AM 6.1 (H) 4.8 - 5.6 % Final    Comment:    (NOTE)         Pre-diabetes: 5.7 - 6.4         Diabetes: >6.4         Glycemic control for adults with diabetes: <7.0   04/19/2013 09:00 AM 6.3 (H) <5.7 % Final    Comment:    (NOTE)                                                                       According to the ADA Clinical Practice Recommendations for 2011, when HbA1c is used as a screening test:  >=6.5%   Diagnostic of Diabetes Mellitus           (if abnormal result is confirmed) 5.7-6.4%   Increased risk of developing Diabetes Mellitus References:Diagnosis and Classification of Diabetes Mellitus,Diabetes SWNI,6270,35(KKXFG 1):S62-S69 and Standards of Medical Care in         Diabetes - 2011,Diabetes HWEX,9371,69 (Suppl 1):S11-S61.    CBG: Recent Labs  Lab 04/29/21 1253 04/29/21 1754 04/29/21 2115  GLUCAP 76 116* 136*    Review of Systems:   10 point review of system taken, please see HPI for positives and negatives.   Past Medical History:  He,  has a past medical history of Arthritis, CAD (coronary artery disease) (07/2010; 07/2012; 08/2014), Chronic  gout, DM type 2 (diabetes mellitus, type 2) ([place]), Edema of both lower extremities, Essential hypertension, History of concussion, History of CVA with residual deficit (07/01/2010), History of non-ST elevation myocardial infarction (NSTEMI) (07-01-2010  and 01/ 2016), History of prostatitis, History of resection of small bowel (08/28/2014), History of retinal tear (04/2013), History of thrombosis (08/2014), Hyperlipidemia with target LDL less than 70 (09/01/2014), Nocturia, PAC (premature atrial contraction), Prostate cancer [facility]) (urologist-- dr herrick/  oncologist-- dr Tammi Klippel), S/P drug eluting coronary stent placement (07/01/2010), and S/P percutaneous transluminal coronary angioplasty (12/ 2013 and 01/ 2016).   Surgical History:   Past Surgical History:  Procedure Laterality Date   BOWEL RESECTION N/A 08/28/2014   Procedure: SMALL BOWEL RESECTION;  Surgeon: [name], MD;  Location: [place];  Service: General;  Laterality: N/A;   CARDIAC CATHETERIZATION N/A 07/20/2015   Procedure: Left Heart Cath and Coronary Angiography;  Surgeon: [name], MD;  Location: [place] CV LAB;  Service: Cardiovascular;  15% ISR in the RCA stent segment, 20% proximal-mid LAD as well as ostial and proximal circumflex. Normal LV function.   CARDIAC CATHETERIZATION N/A 07/23/2016   Procedure: Left Heart Cath and Coronary Angiography;  Surgeon: [name], MD;  Location: [place] CV LAB;  Service: Cardiovascular: E 55-65%. ~15% diffuse RCA ISR, ~20% diffuse pLAD & pCx   CORONARY ANGIOPLASTY  07/06/2012; 08/15/2014   a) 12/'13: PTCA of 80% ISR mRCA - AngioSculpt; b) PTCA of  ISR/thrombosis   CORONARY ANGIOPLASTY WITH STENT PLACEMENT  07-01-2010  dr harding @[facility]    inferior wall MI - 3 Taxus Ion DES (3.0x43mm, 3.0x80mm, 3.5x71mm) to prox and mid RCA   CYSTOSCOPY N/A 01/10/2021   Procedure: CYSTOSCOPY FLEXIBLE;  Surgeon: Ardis Hughs, MD;  Location: [facility];  Service:  Urology;   Laterality: N/A;   INCISIONAL HERNIA REPAIR N/A 10/15/2017   Procedure: HERNIA REPAIR INCISIONAL;  Surgeon: [name], MD;  Location: [facility];  Service: General;  Laterality: N/A;   INGUINAL HERNIA REPAIR Bilateral 05/2010   INSERTION OF MESH N/A 10/15/2017   Procedure: INSERTION OF MESH;  Surgeon: [name], MD;  Location: [facility];  Service: General;  Laterality: N/A;   INTRAVASCULAR ULTRASOUND/IVUS Left 04/29/2021   Procedure: Intravascular Ultrasound/IVUS;  Surgeon: Waynetta Sandy, MD;  Location: [place] CV LAB;  Service: Cardiovascular;  Laterality: Left;   KNEE ARTHROSCOPY W/ MENISCAL REPAIR Left 06/2011   LAPAROSCOPIC APPENDECTOMY N/A 08/28/2014   Procedure: APPENDECTOMY LAPAROSCOPIC;  Surgeon: [name], MD;  Location: [place];  Service: General;  Laterality: N/A;   LAPAROSCOPY  08/28/2014   Procedure: LAPAROSCOPY DIAGNOSTIC;  Surgeon: [name], MD;  Location: [place];  Service: General;;   LEFT HEART CATHETERIZATION WITH CORONARY ANGIOGRAM N/A 06/11/2012   Procedure: LEFT HEART CATHETERIZATION WITH CORONARY ANGIOGRAM;  Surgeon: [name], MD;  Location: [place] CATH LAB;  Service: Cardiovascular;  Laterality: N/A;   LEFT HEART CATHETERIZATION WITH CORONARY ANGIOGRAM N/A 04/18/2013   Procedure: LEFT HEART CATHETERIZATION WITH CORONARY ANGIOGRAM;  Surgeon: [name], MD;  Location: [facility] CATH LAB;  Service: Cardiovascular;  Laterality: N/A;   LEFT HEART CATHETERIZATION WITH CORONARY ANGIOGRAM N/A 08/15/2014   Procedure: LEFT HEART CATHETERIZATION WITH CORONARY ANGIOGRAM;  Surgeon: [name], MD;  Location: [facility] CATH LAB;  Service: Cardiovascular;  Laterality: N/A;   LOWER EXTREMITY VENOGRAPHY Left 04/29/2021   Procedure: LOWER EXTREMITY VENOGRAPHY;  Surgeon: Waynetta Sandy, MD;  Location: [place] CV [facility];  Service: Cardiovascular;  Laterality: Left;   PERCUTANEOUS CORONARY STENT INTERVENTION (PCI-S) N/A 07/06/2012   Procedure: PERCUTANEOUS  CORONARY STENT INTERVENTION (PCI-S);  Surgeon: [name], MD;  Location: [facility] CATH LAB;  Service: Cardiovascular;  Laterality: N/A;   PERIPHERAL VASCULAR THROMBECTOMY Left 04/29/2021   Procedure: PERIPHERAL VASCULAR THROMBECTOMY;  Surgeon: Waynetta Sandy, MD;  Location: [place] CV LAB;  Service: Cardiovascular;  Laterality: Left;   RADIOACTIVE SEED IMPLANT N/A 01/10/2021   Procedure: RADIOACTIVE SEED IMPLANT/BRACHYTHERAPY IMPLANT;  Surgeon: Ardis Hughs, MD;  Location[facility];  Service: Urology;  Laterality: N/A;   SEPTOPLASTY  11-01-2001 @ [place]   SPACE OAR INSTILLATION N/A 01/10/2021   Procedure: SPACE OAR INSTILLATION;  Surgeon: Ardis Hughs, MD;  Location: [facility];  Service: Urology;  Laterality: N/A;   TONSILLECTOMY AND ADENOIDECTOMY  age 57   TRANSTHORACIC ECHOCARDIOGRAM  08/15/2014   Moderate concentric LVH. EF 60-65% with no regional WMA. Gr 1 DD, otherwise normal     Social History:   reports that he quit smoking about 10 years ago. His smoking use included cigarettes. He has a 15.00 pack-year smoking history. His smokeless tobacco use includes snuff. He reports current alcohol use of about 7.0 - 14.0 standard drinks per week. He reports that he does not use drugs.   Family History:  His family history includes Atrial fibrillation in his mother; Coronary artery disease in his father; Heart attack in his father, paternal grandfather, and paternal uncle; Mitral valve prolapse in his mother.   Allergies Allergies  Allergen Reactions   Crestor [Rosuvastatin] Other (See Comments)    Severe muscle pain and nausea   Zofran [Ondansetron Hcl] Nausea And Vomiting   Ace Inhibitors Cough   Lipitor [Atorvastatin] Other (See Comments)    myalgia   Codeine  Itching     Home Medications  Prior to Admission medications   Medication Sig Start Date End Date Taking? Authorizing Provider  acetaminophen (TYLENOL) 325 MG tablet Take  650 mg by mouth daily as needed (pain).   Yes [provider]  aspirin EC 81 MG tablet Take 81 mg by mouth every morning. Swallow whole.   Yes [provider]  carvedilol (COREG) 12.5 MG tablet Take 1 tablet (12.5 mg total) by mouth 2 (two) times daily. 08/17/20 07/03/21 Yes Croitoru, Mihai, MD  Cholecalciferol (VITAMIN D3 PO) Take 1 capsule by mouth every morning.   Yes [provider]  Empagliflozin-metFORMIN HCl ER (SYNJARDY XR) 25-1000 MG TB24 Take 1 tablet by mouth every morning.   Yes [provider]  Evolocumab (REPATHA SURECLICK) 027 MG/ML SOAJ Inject 140 mg into the skin every 14 (fourteen) days. 01/15/21  Yes Kroeger, Daleen Snook M., PA-C  furosemide (LASIX) 40 MG tablet TAKE 2 TABLETS BY MOUTH EVERY MORNING AND 1 TABLET IN THE EVENING Patient taking differently: Take 40-80 mg by mouth See admin instructions. Take one tablet (40 mg) by mouth every morning and two tablets (80 mg) at night 09/25/20  Yes Croitoru, Mihai, MD  ibuprofen (ADVIL) 200 MG tablet Take 800 mg by mouth daily as needed for headache (pain).   Yes [provider]  morphine (MSIR) 15 MG tablet Take 15 mg by mouth 2 (two) times daily as needed for severe pain. 04/27/21  Yes [provider]  nitroGLYCERIN (NITROSTAT) 0.4 MG SL tablet Place 1 tablet (0.4 mg total) under the tongue every 5 (five) minutes x 3 doses as needed for chest pain. 05/25/19  Yes Kroeger, Daleen Snook M., PA-C  olmesartan (BENICAR) 40 MG tablet Take 1 tablet (40 mg total) by mouth daily. 04/12/21  Yes Croitoru, Mihai, MD  Potassium 99 MG TABS Take 1 tablet by mouth daily.   Yes [provider]  RIVAROXABAN Alveda Reasons) VTE STARTER PACK (15 & 20 MG) Follow package directions: Take one 15mg  tablet by mouth twice a day. On day 22, switch to one 20mg  tablet once a day. Take with food. Patient taking differently: Take 15-20 mg by mouth See admin instructions. Follow package directions: Take one 15mg  tablet by mouth  twice a day. On day 22, switch to one 20mg  tablet once a day. Take with food. 04/26/21  Yes Long, Wonda Olds, MD  scopolamine (TRANSDERM-SCOP) 1 MG/3DAYS Place 1 mg onto the skin See admin instructions. Apply one patch transdermally behind ear once as needed for deep sea fishing (no more than once every 3 days 02/28/21  Yes [provider]  Dexamethasone 1.5 MG (21) TBPK Take 1.5 mg by mouth. Patient not taking: No sig reported 04/17/21   [provider]  PAXLOVID, 300/100, 20 x 150 MG & 10 x 100MG  TBPK See admin instructions. Patient not taking: No sig reported 04/17/21   [provider]  Propylene Glycol (SYSTANE COMPLETE) 0.6 % SOLN Apply 1 drop to eye daily. Patient not taking: No sig reported    [provider]  tamsulosin (FLOMAX) 0.4 MG CAPS capsule Take 1 capsule (0.4 mg total) by mouth daily. Patient not taking: No sig reported 01/10/21   Ardis Hughs, MD  traMADol (ULTRAM) 50 MG tablet Take 1-2 tablets (50-100 mg total) by mouth every 6 (six) hours as needed for moderate pain. Patient not taking: No sig reported 01/10/21   Ardis Hughs, MD     Critical care time: 25 min  Richardson Landry Sakinah Rosamond ACNP Acute Care Nurse Practitioner [place] Please consult Amion 04/30/2021, 11:48 AM

## 2021-05-01 ENCOUNTER — Other Ambulatory Visit (HOSPITAL_COMMUNITY): Payer: Self-pay

## 2021-05-01 DIAGNOSIS — I824Y2 Acute embolism and thrombosis of unspecified deep veins of left proximal lower extremity: Secondary | ICD-10-CM | POA: Diagnosis not present

## 2021-05-01 LAB — HEPARIN LEVEL (UNFRACTIONATED): Heparin Unfractionated: 0.25 IU/mL — ABNORMAL LOW (ref 0.30–0.70)

## 2021-05-01 LAB — CBC
HCT: 34.1 % — ABNORMAL LOW (ref 39.0–52.0)
Hemoglobin: 11.4 g/dL — ABNORMAL LOW (ref 13.0–17.0)
MCH: 31.9 pg (ref 26.0–34.0)
MCHC: 33.4 g/dL (ref 30.0–36.0)
MCV: 95.5 fL (ref 80.0–100.0)
Platelets: 188 10*3/uL (ref 150–400)
RBC: 3.57 MIL/uL — ABNORMAL LOW (ref 4.22–5.81)
RDW: 13.2 % (ref 11.5–15.5)
WBC: 5.2 10*3/uL (ref 4.0–10.5)
nRBC: 0 % (ref 0.0–0.2)

## 2021-05-01 LAB — BASIC METABOLIC PANEL
Anion gap: 8 (ref 5–15)
BUN: 12 mg/dL (ref 6–20)
CO2: 28 mmol/L (ref 22–32)
Calcium: 8.6 mg/dL — ABNORMAL LOW (ref 8.9–10.3)
Chloride: 99 mmol/L (ref 98–111)
Creatinine, Ser: 0.84 mg/dL (ref 0.61–1.24)
GFR, Estimated: >60 mL/min (ref >60)
Glucose, Bld: 101 mg/dL — ABNORMAL HIGH (ref 70–99)
Potassium: 3.4 mmol/L — ABNORMAL LOW (ref 3.5–5.1)
Sodium: 135 mmol/L (ref 135–145)

## 2021-05-01 LAB — URIC ACID: Uric Acid, Serum: 5.9 mg/dL (ref 3.7–8.6)

## 2021-05-01 LAB — APTT: aPTT: 55 seconds — ABNORMAL HIGH (ref 24–36)

## 2021-05-01 MED ORDER — RIVAROXABAN 15 MG PO TABS
15.0000 mg | ORAL_TABLET | Freq: Two times a day (BID) | ORAL | Status: DC
  Administered 2021-05-01: 15 mg via ORAL
  Filled 2021-05-01: qty 1

## 2021-05-01 MED ORDER — TRAMADOL HCL 50 MG PO TABS: 50.0000 mg | ORAL_TABLET | Freq: Four times a day (QID) | ORAL | 0 refills | Status: DC | PRN

## 2021-05-01 MED ORDER — RIVAROXABAN 15 MG PO TABS: 15.0000 mg | ORAL_TABLET | Freq: Two times a day (BID) | ORAL | 3 refills | Status: DC

## 2021-05-01 NOTE — Progress Notes (Signed)
ANTICOAGULATION CONSULT NOTE - Consult  Pharmacy Consult for Xarelto Indication: DVT  Allergies  Allergen Reactions   Crestor [Rosuvastatin] Other (See Comments)    Severe muscle pain and nausea   Zofran [Ondansetron Hcl] Nausea And Vomiting   Ace Inhibitors Cough   Lipitor [Atorvastatin] Other (See Comments)    myalgia   Codeine Itching    Patient Measurements: Height: 6\' 1"  (185.4 cm) Weight: 113.4 kg (250 lb) IBW/kg (Calculated) : 79.9 Heparin Dosing Weight: 104 kg  Vital Signs: Temp: 99.2 F (37.3 C) (09/28 0820) Temp Source: Oral (09/28 0820) BP: 103/73 (09/28 0820) Pulse Rate: 90 (09/28 0820)  Labs: Recent Labs    04/29/21 0453 04/29/21 1436 04/30/21 0016 05/01/21 0147  HGB 12.4*  --  11.3* 11.4*  HCT 37.1*  --  33.8* 34.1*  PLT 173  --  169 188  APTT 68* 75* 100* 55*  HEPARINUNFRC >1.10*  --  0.76* 0.25*  CREATININE 0.93  --  0.84 0.84    Estimated Creatinine Clearance: 129.6 mL/min (by C-G formula based on SCr of 0.84 mg/dL).   Medical History: Past Medical History:  Diagnosis Date   Arthritis    CAD (coronary artery disease) 07/2010; 07/2012; 08/2014   cardiologist--- dr croitoru;  1) 11-28-'11: NSTEMI - 100% RCA - PCI (3 Taxus ION DES 3.0 x 28, 3.0 x 24 & 3.5 x 12 distal-prox); b) 12/ 2013 ISR of RCA stent - -AnngioSculpt PTCA; c) NSTEMI 1/'16: mild ISR with Thrombosis of RCA Stents (in setting of Mesenteric Vein Thrombosis) - Cutting Balloon PTCA; c. Re-look Cath 07/2015 & 07/2016: ~15% ISR in RCA,20% p-mLAD & o-pCx.   Chronic gout    01-07-2021  last flare-up approxl 12-17-2020 bilteral great toes   DM type 2 (diabetes mellitus, type 2) ([place])    followed by pcp   (01-07-2021 per pt does not check blood sugar at home)   Edema of both lower extremities    01-07-2021  pt states wears compression hose   Essential hypertension    followed by pcp and cardiology   History of concussion    01-07-2021  per pt x4 as child, last one age 30 without  residual   History of CVA with residual deficit 40/81/4481   embolic left MCA infarct post PCI stenting approx one hour  (01-07-2021 per pt has residual minimal intermittant aphasia )   History of non-ST elevation myocardial infarction (NSTEMI) 07-01-2010  and 01/ 2016   History of prostatitis    History of resection of small bowel 08/28/2014   small bowel perforation,  s/p resction    History of retinal tear 04/2013   right micro-rupture , no occlusion, retinal artery branch w/ vision loss,  per imaging no infarct/ occlusion   History of thrombosis 08/2014   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, provoked by testosterone injection,  08-09-2014 s/p thrombolytic lysis VIR    Hyperlipidemia with target LDL less than 70 09/01/2014   Nocturia    PAC (premature atrial contraction)    pt hx palpitations, event monitor 08-18-2017  showed NS w/ occasional PACs, rare PVCs   Prostate cancer [facility]) urologist-- dr herrick/  oncologist-- dr Tammi Klippel   dx 09-04-2020,  Stage T1c, Gleason 4+3    S/P drug eluting coronary stent placement 07/01/2010   DES x3  to prox and distal RCA   S/P percutaneous transluminal coronary angioplasty 12/ 2013 and 01/ 2016   for ISR  RCA    Medications:  Scheduled:   aspirin EC  81 mg Oral Daily   carvedilol  12.5 mg Oral BID   empagliflozin  25 mg Oral Daily   irbesartan  300 mg Oral Daily   pantoprazole  40 mg Oral Daily   potassium chloride  40 mEq Oral Daily   rivaroxaban  15 mg Oral BID   sodium chloride flush  3 mL Intravenous Q12H   Infusions:   sodium chloride 75 mL/hr at 04/29/21 2000   heparin 1,800 Units/hr (05/01/21 0504)    Assessment: 55 yr old man with hx of CAD, DM, hx prior thrombosis presented with extensive LLE DVT. Patient recently diagnosed with DVT 9/23 and prescribed Xarelto with plan for return to vascular outpatient clinic and IVC iliac duplex. Patient returned with 10/10 pain. Pharmacy initially consulted for IV  heparin while Xarelto was on hold for pending OR (Xarelto PTA, started 2 days PTA at ED visit, last dose 9/25 AM).     Now s/p mechanical thrombectomy left external iliac, left common femoral, left femoral, left popliteal veins. Pharmacy consulted to discontinue heparin infusion and resume rivaroxaban.   Goal of Therapy:  Monitor platelets by anticoagulation protocol: Yes   Plan:  - Stop heparin infusion at 10:00 - Start rivaroxaban 15mg  tablets twice daily at 10:00 - Continue to monitor H&H and platelets   Thank you for allowing pharmacy to be a part of this patient's care.  Ardyth Harps, PharmD Clinical Pharmacist

## 2021-05-01 NOTE — Progress Notes (Signed)
Pt discharged from unit. Medication/discharge instruction given  Jimy Gates K Brionne Mertz, RN  

## 2021-05-01 NOTE — Progress Notes (Addendum)
Patient requested IV dilaudid every 2 hours and IV Morphine every 1 hour. Pt appeared comfortably lying on the bed. His vital signs stable and no signs of sever pain. We have some concern about medication overdose. Risk and side effect were explained. Pt expressed his frustrations towards staff nurse and demanded to get immediate IV pain medicine as her requested every one hour.   We offered Toradol, Tramadol or Oxycodone for him and he refused. We discussed with Pt about the plan of care that he cannot get any IV pain med when he discharges from  hospital. Pt was aware and stated he wants to get IV med as much as he can have in the hospital, even though he had Hx of deep sedative effect and unresponsive in OR due to narcotic. Pt acted with verbally  aggressive toward RN staff when we were trying to negotiate what is the best choices for him in the long term for pain management. We discussed with Ivin Booty, RN incharge with this issue. We will hand off to day shift care team and MD.  Kennyth Lose, RN

## 2021-05-01 NOTE — Progress Notes (Addendum)
[facility]  Subjective  - He is walking around his room without O2 support SATs 93-97.   Objective 109/71 74 98.3 F (36.8 C) (Oral) 20 100%  Intake/Output Summary (Last 24 hours) at 05/01/2021 0733 Last data filed at 05/01/2021 0524 Gross per 24 hour  Intake 764.03 ml  Output 1200 ml  Net -435.97 ml    Lungs No O2 SAT room air 93-97 %, no SOB Ted hose in place left LE tolerating well Left LE less edema Heart RRR Pain is a 6 this am     Assessment/Planning: POD # 2  Attempted   Procedure Performed: 1.  Ultrasound-guided cannulation left small saphenous vein 2.  IVC venogram 3.  Intravascular ultrasound left popliteal, left femoral, left common femoral, left external iliac, left common iliac veins and IVC 4.  Mechanical thrombectomy left external iliac, left common femoral, left femoral, left popliteal veins with Inari Clottriever  B PE's per CTA  No SOB, ambulating in his room without assistance Plan will be to discharge home today on Xarelto.  He has a prescription at home.  He asked for tramadol for pain control. F/U with DR. Atleigh Gruen in 2-3 weeks to discuss possible return to the PVL for left LE venous thrombectomy.  Roxy Horseman 05/01/2021 7:33 AM --  Laboratory Lab Results: Recent Labs    04/30/21 0016 05/01/21 0147  WBC 6.0 5.2  HGB 11.3* 11.4*  HCT 33.8* 34.1*  PLT 169 188   BMET Recent Labs    04/30/21 0016 05/01/21 0147  NA 137 135  K 3.3* 3.4*  CL 98 99  CO2 28 28  GLUCOSE 94 101*  BUN 11 12  CREATININE 0.84 0.84  CALCIUM 8.4* 8.6*    COAG Lab Results  Component Value Date   INR 1.1 04/26/2021   INR 1.0 01/08/2021   INR 1.10 07/23/2016   No results found for: PTT   I have independently interviewed and examined patient and agree with PA assessment and plan above.  He will follow-up in a few weeks to evaluate his left lower extremity.  Certainly he does have May Thurner syndrome by IVUS on our  recent venography that was not completed.  For this he will need stenting at some point time.  If in 2 weeks his leg still has significant swelling we can consider left lower extremity venous thrombectomy under general anesthesia.  If his leg is improved we can consider interval treatment of his May Thurner syndrome when he is definitively stable and out from his pulmonary embolus and COVID.  Given history of portal vein thrombosis he would likely benefit from thrombophilia work-up prior to discontinuing anticoagulation.  He will continue anticoagulation for at least 6 months indefinitely until he is cleared from prostate cancer standpoint.  Yvaine Jankowiak C. Donzetta Matters, MD [facility] Office: [phone] Pager: [phone]

## 2021-05-01 NOTE — Progress Notes (Addendum)
NAME:  Dylan [name], MRN:  [id number], DOB:  [birth year], LOS: 2 ADMISSION DATE:  04/28/2021, CONSULTATION DATE: 04/30/2021 REFERRING MD: Dr. Raliegh Ip, CHIEF COMPLAINT: Hypoxia in the setting of pulmonary embolism with desaturation during sedation for thrombectomy.  History of Present Illness:  54 year old who has extensive past medical history as noted below who came in for a left thrombectomy and while on the operating table in the prone position desaturated.  He is got a recent history of COVID.  Follow-up CT scan post URS showed bilateral pulmonary embolism.  Pulmonary critical care was asked to evaluate.  He is found to be hemodynamically stable in no acute distress at rest.  Question to pulmonary as thrombolytics needed at this time.  We await 2D echo and note that vascular surgery says thrombolytics not contraindicated at this point.  Pertinent  Medical History   Past Medical History:  Diagnosis Date   Arthritis    CAD (coronary artery disease) 07/2010; 07/2012; 08/2014   cardiologist--- dr croitoru;  1) 11-28-'11: NSTEMI - 100% RCA - PCI (3 Taxus ION DES 3.0 x 28, 3.0 x 24 & 3.5 x 12 distal-prox); b) 12/ 2013 ISR of RCA stent - -AnngioSculpt PTCA; c) NSTEMI 1/'16: mild ISR with Thrombosis of RCA Stents (in setting of Mesenteric Vein Thrombosis) - Cutting Balloon PTCA; c. Re-look Cath 07/2015 & 07/2016: ~15% ISR in RCA,20% p-mLAD & o-pCx.   Chronic gout    01-07-2021  last flare-up approxl 12-17-2020 bilteral great toes   DM type 2 (diabetes mellitus, type 2) ([place])    followed by pcp   (01-07-2021 per pt does not check blood sugar at home)   Edema of both lower extremities    01-07-2021  pt states wears compression hose   Essential hypertension    followed by pcp and cardiology   History of concussion    01-07-2021  per pt x4 as child, last one age 58 without residual   History of CVA with residual deficit 03/88/8280   embolic left MCA infarct post PCI stenting approx one hour   (01-07-2021 per pt has residual minimal intermittant aphasia )   History of non-ST elevation myocardial infarction (NSTEMI) 07-01-2010  and 01/ 2016   History of prostatitis    History of resection of small bowel 08/28/2014   small bowel perforation,  s/p resction    History of retinal tear 04/2013   right micro-rupture , no occlusion, retinal artery branch w/ vision loss,  per imaging no infarct/ occlusion   History of thrombosis 08/2014   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, provoked by testosterone injection,  08-09-2014 s/p thrombolytic lysis VIR    Hyperlipidemia with target LDL less than 70 09/01/2014   Nocturia    PAC (premature atrial contraction)    pt hx palpitations, event monitor 08-18-2017  showed NS w/ occasional PACs, rare PVCs   Prostate cancer [facility]) urologist-- dr herrick/  oncologist-- dr Tammi Klippel   dx 09-04-2020,  Stage T1c, Gleason 4+3    S/P drug eluting coronary stent placement 07/01/2010   DES x3  to prox and distal RCA   S/P percutaneous transluminal coronary angioplasty 12/ 2013 and 01/ 2016   for ISR  RCA     Significant Hospital Events: Including procedures, antibiotic start and stop dates in addition to other pertinent events   04/30/2021 near code situation in OR  Interim History / Subjective:  Questionable discharge today follow-up appointment is been made with Dr. Virgel Bouquet  Objective   Blood pressure 103/73, pulse 90, temperature 99.2 F (37.3 C), temperature source Oral, resp. rate 16, height 6\' 1"  (6.195 m), weight 113.4 kg, SpO2 96 %.        Intake/Output Summary (Last 24 hours) at 05/01/2021 0929 Last data filed at 05/01/2021 0524 Gross per 24 hour  Intake 646.03 ml  Output 1200 ml  Net -553.97 ml   Filed Weights   04/29/21 0700  Weight: 113.4 kg    Examination: General: Well-nourished well-developed obese male in no acute distress sitting in chair HEENT: MM pink/moist no JVD or lymphadenopathy is  appreciated Neuro: Fully intact without focal defect CV: Heart sounds are regular PULM:   GI: soft, bsx4 activ avoid e  GU: Extremities: warm/dry, 1-2+ left lower EXTR edema  Skin: no rashes or lesions  Resolved Hospital Problem list     Assessment & Plan:  Status post acute hypoxia during surgical interventions for thrombectomy on left came acutely hypoxic and required coming into the prone position ventilated near code situation but did not require ACLS drugs.  This is likely multifactorial suspect he had a PE prior to admission.  Has a history of recent COVID which may have exacerbated his hypoxia CT scan of the chest is remarkable bilateral PEs.  No need for thrombolytics Transition from heparin to oral anticoagulation Eliquis Discharge when ready per vascular services viewpoint He has a follow-up appointment with Dr. Larey Days at pulmonary critical care at [facility] for October 20 at 3 PM.     Extensive cardiovascular disease Monitored in stepdown unit   Diabetic CBG (last 3)  Recent Labs    04/29/21 1253 04/29/21 1754 04/29/21 2115  GLUCAP 76 116* 136*   Sliding-scale insulin protocol  Best Practice (right click and "Reselect all SmartList Selections" daily)   Per primary  Labs   CBC: Recent Labs  Lab 04/26/21 2020 04/28/21 1336 04/29/21 0453 04/30/21 0016 05/01/21 0147  WBC 4.7 7.7 6.9 6.0 5.2  NEUTROABS 3.1 6.1  --   --   --   HGB 13.1 14.2 12.4* 11.3* 11.4*  HCT 38.0* 41.3 37.1* 33.8* 34.1*  MCV 92.2 93.4 95.6 94.7 95.5  PLT 174 218 173 169 093    Basic Metabolic Panel: Recent Labs  Lab 04/26/21 2020 04/28/21 1336 04/29/21 0453 04/30/21 0016 05/01/21 0147  NA 139 137 136 137 135  K 3.1* 3.1* 3.4* 3.3* 3.4*  CL 97* 95* 96* 98 99  CO2 31 32 30 28 28   GLUCOSE 104* 162* 107* 94 101*  BUN 20 12 11 11 12   CREATININE 0.92 1.03 0.93 0.84 0.84  CALCIUM 9.6 8.8* 8.5* 8.4* 8.6*   GFR: Estimated Creatinine Clearance: 129.6 mL/min (by  C-G formula based on SCr of 0.84 mg/dL). Recent Labs  Lab 04/28/21 1336 04/29/21 0453 04/30/21 0016 05/01/21 0147  WBC 7.7 6.9 6.0 5.2    Liver Function Tests: No results for input(s): AST, ALT, ALKPHOS, BILITOT, PROT, ALBUMIN in the last 168 hours. No results for input(s): LIPASE, AMYLASE in the last 168 hours. No results for input(s): AMMONIA in the last 168 hours.  ABG    Component Value Date/Time   TCO2 24 07/01/2010 0652     Coagulation Profile: Recent Labs  Lab 04/26/21 2020  INR 1.1    Cardiac Enzymes: No results for input(s): CKTOTAL, CKMB, CKMBINDEX, TROPONINI in the last 168 hours.  HbA1C: Hgb A1c MFr Bld  Date/Time Value Ref Range Status  07/23/2016 04:41 AM 6.1 (H)  4.8 - 5.6 % Final    Comment:    (NOTE)         Pre-diabetes: 5.7 - 6.4         Diabetes: >6.4         Glycemic control for adults with diabetes: <7.0   04/19/2013 09:00 AM 6.3 (H) <5.7 % Final    Comment:    (NOTE)                                                                       According to the ADA Clinical Practice Recommendations for 2011, when HbA1c is used as a screening test:  >=6.5%   Diagnostic of Diabetes Mellitus           (if abnormal result is confirmed) 5.7-6.4%   Increased risk of developing Diabetes Mellitus References:Diagnosis and Classification of Diabetes Mellitus,Diabetes XBWI,2035,59(RCBUL 1):S62-S69 and Standards of Medical Care in         Diabetes - 2011,Diabetes AGTX,6468,03 (Suppl 1):S11-S61.    CBG: Recent Labs  Lab 04/29/21 1253 04/29/21 1754 04/29/21 2115  GLUCAP 76 116* 136Richardson Landry Minor ACNP Acute Care Nurse Practitioner [place] Please consult Amion 05/01/2021, 9:29 AM   PCCM:  Patient doing well post embolectomy. Case discussed with Richardson Landry Minor, NP.   BP 103/73 (BP Location: Left Arm)   Pulse 90   Temp 99.2 F (37.3 C) (Oral)   Resp 16   Ht 6\' 1"  (1.854 m)   Wt 113.4 kg   SpO2 96%   BMI 32.98  kg/m   Gen: middle aged male comfortable in bed  Heart: RRR, s1 s2  Lungs: CTAB Abd: soft nt nd   Labs reviewed   A:  PE  S/p embolectomy   P: Needs outpatient pulmonary follow up  We will set this up Continue anticoagulation  Likely need a follow up ECHO in 6-8 weeks  Call us if needed   Garner Nash, [facility] 05/01/2021 12:10 PM

## 2021-05-01 NOTE — TOC Benefit Eligibility Note (Signed)
Patient Teacher, English as a foreign language completed.    The patient is currently admitted and upon discharge could be taking Eliquis Starter Pack.  The current 30 day co-pay is, $0.00.   The patient is insured through [place], [place] Patient Advocate Specialist [place] Team Direct Number: [phone]  Fax: [phone]

## 2021-05-01 NOTE — Progress Notes (Signed)
Entered pt room and pt was upset that IV medication wasn't given when he asked. He stated he didn't understand how all of a sudden he wasn't able to get IV medication when he was getting it earlier in the PM shift and stated he did not like how the situation was handled. I educated the pt that we cant send him home on Iv pain medication and that we try to switch pt to oral  pain medication so we can determined pain management prior to discharge. Pt stated he understand and stated he will be fine but "if it came to driving a new car or a old one why would he chose the old car if the new car will help right away".  Later PA called and asked RN to give Tramadol. I explained pt is requesting IV pain medication  I asked if PA spoke with Pt or can speak to pt in regards to this change due to when speaking to pt this AM he was not happy about the situation.  Phoebe Sharps, RN

## 2021-05-08 ENCOUNTER — Telehealth: Payer: Self-pay

## 2021-05-08 NOTE — Telephone Encounter (Signed)
Patient calls today with follow up questions s/p thrombectomy. He is still having pain the left leg. Discussed with MD, added on visit next week.

## 2021-05-13 ENCOUNTER — Telehealth: Payer: Self-pay | Admitting: Adult Health

## 2021-05-13 NOTE — Discharge Summary (Signed)
[facility] Discharge Summary   Patient ID:  Dylan [name] MRN: [id number] DOB/AGE: [birth year] 55 y.o.  Admit date: 04/28/2021 Discharge date: 05/01/21 Date of Surgery: 04/29/2021 Surgeon: Surgeon(s): Waynetta Sandy, MD  Admission Diagnosis: Hypokalemia [E87.6] DVT (deep venous thrombosis) ([place]) [I82.409] Acute deep vein thrombosis (DVT) of femoral vein of left lower extremity (HCC) [I82.412] Acute deep vein thrombosis (DVT) ([place]) [I82.409]  Discharge Diagnoses:  Hypokalemia [E87.6] DVT (deep venous thrombosis) (HCC) [I82.409] Acute deep vein thrombosis (DVT) of femoral vein of left lower extremity (HCC) [I82.412] Acute deep vein thrombosis (DVT) ([place]) [I82.409]  Secondary Diagnoses: Past Medical History:  Diagnosis Date   Arthritis    CAD (coronary artery disease) 07/2010; 07/2012; 08/2014   cardiologist--- dr croitoru;  1) 11-28-'11: NSTEMI - 100% RCA - PCI (3 Taxus ION DES 3.0 x 28, 3.0 x 24 & 3.5 x 12 distal-prox); b) 12/ 2013 ISR of RCA stent - -AnngioSculpt PTCA; c) NSTEMI 1/'16: mild ISR with Thrombosis of RCA Stents (in setting of Mesenteric Vein Thrombosis) - Cutting Balloon PTCA; c. Re-look Cath 07/2015 & 07/2016: ~15% ISR in RCA,20% p-mLAD & o-pCx.   Chronic gout    01-07-2021  last flare-up approxl 12-17-2020 bilteral great toes   DM type 2 (diabetes mellitus, type 2) ([place])    followed by pcp   (01-07-2021 per pt does not check blood sugar at home)   Edema of both lower extremities    01-07-2021  pt states wears compression hose   Essential hypertension    followed by pcp and cardiology   History of concussion    01-07-2021  per pt x4 as child, last one age 67 without residual   History of CVA with residual deficit 40/98/1191   embolic left MCA infarct post PCI stenting approx one hour  (01-07-2021 per pt has residual minimal intermittant aphasia )   History of non-ST elevation myocardial infarction (NSTEMI) 07-01-2010  and 01/ 2016    History of prostatitis    History of resection of small bowel 08/28/2014   small bowel perforation,  s/p resction    History of retinal tear 04/2013   right micro-rupture , no occlusion, retinal artery branch w/ vision loss,  per imaging no infarct/ occlusion   History of thrombosis 08/2014   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, provoked by testosterone injection,  08-09-2014 s/p thrombolytic lysis VIR    Hyperlipidemia with target LDL less than 70 09/01/2014   Nocturia    PAC (premature atrial contraction)    pt hx palpitations, event monitor 08-18-2017  showed NS w/ occasional PACs, rare PVCs   Prostate cancer [facility]) urologist-- dr herrick/  oncologist-- dr Tammi Klippel   dx 09-04-2020,  Stage T1c, Gleason 4+3    S/P drug eluting coronary stent placement 07/01/2010   DES x3  to prox and distal RCA   S/P percutaneous transluminal coronary angioplasty 12/ 2013 and 01/ 2016   for ISR  RCA    Procedure(s): LOWER EXTREMITY VENOGRAPHY Intravascular Ultrasound/IVUS PERIPHERAL VASCULAR THROMBECTOMY  Discharged Condition: stable  HPI: 55 year old male with recent diagnosis of COVID 14 days ago also undergoing treatment for prostate cancer found to have extensive left lower extremity DVT with severe pain.   Hospital Course:  Dylan [name] is a 55 y.o. male is S/P Left Procedure(s): LOWER EXTREMITY VENOGRAPHY Intravascular Ultrasound/IVUS PERIPHERAL VASCULAR THROMBECTOMY Procedure was stopped prior to intervention  Post he required O2 4L non labored breathing.  Chest x ray negative.  Dues to recent history of COVID and SOB a CT chest was ordered resulting in  IMPRESSION: 1. Large burden of pulmonary embolus in the distal right and right-sided lobar branch pulmonary arteries. 2. Subsegmental embolus noted in the left lower lobe; presumably the majority of left-sided embolus was extracted during embolectomy procedure.  He was on a Heparin drip followed by  Xarelto at discharge.  Plan will be recovery from PE and return to the [facility] in the future for thrombectomy and stenting of his left common iliac vein for May Thurner syndrome.  Given history of portal vein thrombus he would likely benefit from thrombophilia work-up in the future.  He will f/u with Dr. Donzetta Matters.  Significant Diagnostic Studies: CBC Lab Results  Component Value Date   WBC 5.2 05/01/2021   HGB 11.4 (L) 05/01/2021   HCT 34.1 (L) 05/01/2021   MCV 95.5 05/01/2021   PLT 188 05/01/2021    BMET    Component Value Date/Time   NA 135 05/01/2021 0147   NA 140 09/28/2014 1438   K 3.4 (L) 05/01/2021 0147   K 4.3 09/28/2014 1438   CL 99 05/01/2021 0147   CO2 28 05/01/2021 0147   CO2 25 09/28/2014 1438   GLUCOSE 101 (H) 05/01/2021 0147   GLUCOSE 134 09/28/2014 1438   BUN 12 05/01/2021 0147   BUN 27.4 (H) 10/04/2014 1445   CREATININE 0.84 05/01/2021 0147   CREATININE 1.14 05/29/2016 1157   CREATININE 1.6 (H) 10/04/2014 1445   CALCIUM 8.6 (L) 05/01/2021 0147   CALCIUM 9.5 09/28/2014 1438   GFRNONAA >60 05/01/2021 0147   GFRAA >60 01/13/2019 1552   COAG Lab Results  Component Value Date   INR 1.1 04/26/2021   INR 1.0 01/08/2021   INR 1.10 07/23/2016     Disposition:  Discharge to :Home Discharge Instructions     Call MD for:  redness, tenderness, or signs of infection (pain, swelling, bleeding, redness, odor or green/yellow discharge around incision site)   Complete by: As directed    Call MD for:  severe or increased pain, loss or decreased feeling  in affected limb(s)   Complete by: As directed    Call MD for:  temperature >100.5   Complete by: As directed    Resume previous diet   Complete by: As directed       Allergies as of 05/01/2021       Reactions   Crestor [rosuvastatin] Other (See Comments)   Severe muscle pain and nausea   Zofran [ondansetron Hcl] Nausea And Vomiting   Ace Inhibitors Cough   Lipitor [atorvastatin] Other (See Comments)   myalgia    Codeine Itching        Medication List     STOP taking these medications    Xarelto Starter Pack Generic drug: Rivaroxaban Stater Pack (15 mg and 20 mg) Replaced by: Rivaroxaban 15 MG Tabs tablet       TAKE these medications    acetaminophen 325 MG tablet Commonly known as: TYLENOL Take 650 mg by mouth daily as needed (pain).   aspirin EC 81 MG tablet Take 81 mg by mouth every morning. Swallow whole.   carvedilol 12.5 MG tablet Commonly known as: COREG Take 1 tablet (12.5 mg total) by mouth 2 (two) times daily.   Dexamethasone 1.5 MG (21) Tbpk Take 1.5 mg by mouth.   furosemide 40 MG tablet Commonly known as: LASIX TAKE 2 TABLETS BY MOUTH EVERY MORNING AND 1 TABLET IN THE EVENING What  changed:  how much to take how to take this when to take this additional instructions   ibuprofen 200 MG tablet Commonly known as: ADVIL Take 800 mg by mouth daily as needed for headache (pain).   morphine 15 MG tablet Commonly known as: MSIR Take 15 mg by mouth 2 (two) times daily as needed for severe pain.   nitroGLYCERIN 0.4 MG SL tablet Commonly known as: NITROSTAT Place 1 tablet (0.4 mg total) under the tongue every 5 (five) minutes x 3 doses as needed for chest pain.   olmesartan 40 MG tablet Commonly known as: BENICAR Take 1 tablet (40 mg total) by mouth daily.   Paxlovid (300/100) 20 x 150 MG & 10 x 100MG  Tbpk Generic drug: nirmatrelvir & ritonavir See admin instructions.   Potassium 99 MG Tabs Take 1 tablet by mouth daily.   Repatha SureClick 330 MG/ML Soaj Generic drug: Evolocumab Inject 140 mg into the skin every 14 (fourteen) days.   Rivaroxaban 15 MG Tabs tablet Commonly known as: XARELTO Take 1 tablet (15 mg total) by mouth 2 (two) times daily. Replaces: Xarelto Starter Pack   scopolamine 1 MG/3DAYS Commonly known as: TRANSDERM-SCOP Place 1 mg onto the skin See admin instructions. Apply one patch transdermally behind ear once as needed for deep  sea fishing (no more than once every 3 days   Synjardy XR 25-1000 MG Tb24 Generic drug: Empagliflozin-metFORMIN HCl ER Take 1 tablet by mouth every morning.   Systane Complete 0.6 % Soln Generic drug: Propylene Glycol Apply 1 drop to eye daily.   tamsulosin 0.4 MG Caps capsule Commonly known as: FLOMAX Take 1 capsule (0.4 mg total) by mouth daily.   traMADol 50 MG tablet Commonly known as: Ultram Take 1-2 tablets (50-100 mg total) by mouth every 6 (six) hours as needed for moderate pain.   VITAMIN D3 PO Take 1 capsule by mouth every morning.       Verbal and written Discharge instructions given to the patient. Wound care per Discharge AVS  Follow-up Information     Hunsucker, Bonna Gains, MD Follow up on 05/23/2021.   Specialty: Pulmonary Disease Why: Dr. Silas Flood 3 pm Contact information: [address] [place] [address] [place] [address] [phone]         Waynetta Sandy, MD Follow up in 4 week(s).   Specialties: Vascular Surgery, Cardiology Why: Office will call you to arrange your appt (sent) Contact information: [place] [address] [phone]                 Signed: Roxy Horseman 05/13/2021, 10:46 AM

## 2021-05-13 NOTE — Telephone Encounter (Signed)
Reviewed SCP visit with patient.  He would like an in person appt.  Schedule message sent for early November around lunch time.   Wilber Bihari, NP

## 2021-05-13 NOTE — Telephone Encounter (Signed)
Scheduled per 10/10 sch msg, pt has been called and confirmed appt

## 2021-05-15 ENCOUNTER — Ambulatory Visit (INDEPENDENT_AMBULATORY_CARE_PROVIDER_SITE_OTHER): Payer: 59 | Admitting: Vascular Surgery

## 2021-05-15 ENCOUNTER — Other Ambulatory Visit: Payer: Self-pay

## 2021-05-15 ENCOUNTER — Encounter: Payer: Self-pay | Admitting: Vascular Surgery

## 2021-05-15 VITALS — BP 112/82 | HR 79 | Temp 97.9°F | Resp 20 | Ht 73.0 in | Wt 252.0 lb

## 2021-05-15 DIAGNOSIS — I871 Compression of vein: Secondary | ICD-10-CM

## 2021-05-15 NOTE — Progress Notes (Signed)
Patient ID: Dylan [name], male   DOB: Nov 30, 1965, 55 y.o.   MRN: [id number]  Reason for Consult: Follow-up   Referred by [name], PA-C  Subjective:     HPI:  Dylan [name] is a 55 y.o. male recently admitted with extensive left lower extremity DVT and PE.  He also has remote history of portal vein thrombosis.  He now returns for follow-up as his previous procedure was aborted due to desaturations.  He continues to have swelling below the knee on the left.  This has been a longstanding issue for him.  He continues on Xarelto.  He is hoping to have a knee replacement in March of next year.  He does have some shortness of breath with prolonged activity but overall his breathing is much improved since hospital stay.  Past Medical History:  Diagnosis Date   Arthritis    CAD (coronary artery disease) 07/2010; 07/2012; 08/2014   cardiologist--- dr croitoru;  1) 11-28-'11: NSTEMI - 100% RCA - PCI (3 Taxus ION DES 3.0 x 28, 3.0 x 24 & 3.5 x 12 distal-prox); b) 12/ 2013 ISR of RCA stent - -AnngioSculpt PTCA; c) NSTEMI 1/'16: mild ISR with Thrombosis of RCA Stents (in setting of Mesenteric Vein Thrombosis) - Cutting Balloon PTCA; c. Re-look Cath 07/2015 & 07/2016: ~15% ISR in RCA,20% p-mLAD & o-pCx.   Chronic gout    01-07-2021  last flare-up approxl 12-17-2020 bilteral great toes   DM type 2 (diabetes mellitus, type 2) ([place])    followed by pcp   (01-07-2021 per pt does not check blood sugar at home)   Edema of both lower extremities    01-07-2021  pt states wears compression hose   Essential hypertension    followed by pcp and cardiology   History of concussion    01-07-2021  per pt x4 as child, last one age 71 without residual   History of CVA with residual deficit 19/62/2297   embolic left MCA infarct post PCI stenting approx one hour  (01-07-2021 per pt has residual minimal intermittant aphasia )   History of non-ST elevation myocardial infarction (NSTEMI) 07-01-2010  and  01/ 2016   History of prostatitis    History of resection of small bowel 08/28/2014   small bowel perforation,  s/p resction    History of retinal tear 04/2013   right micro-rupture , no occlusion, retinal artery branch w/ vision loss,  per imaging no infarct/ occlusion   History of thrombosis 08/2014   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, provoked by testosterone injection,  08-09-2014 s/p thrombolytic lysis VIR    Hyperlipidemia with target LDL less than 70 09/01/2014   Nocturia    PAC (premature atrial contraction)    pt hx palpitations, event monitor 08-18-2017  showed NS w/ occasional PACs, rare PVCs   Prostate cancer [facility]) urologist-- dr herrick/  oncologist-- dr Tammi Klippel   dx 09-04-2020,  Stage T1c, Gleason 4+3    S/P drug eluting coronary stent placement 07/01/2010   DES x3  to prox and distal RCA   S/P percutaneous transluminal coronary angioplasty 12/ 2013 and 01/ 2016   for ISR  RCA   Family History  Problem Relation Age of Onset   Atrial fibrillation Mother    Mitral valve prolapse Mother    Coronary artery disease Father        CABG, aortic aneursym,   Heart attack Father    Heart attack Paternal Uncle  Heart attack Paternal Grandfather    Past Surgical History:  Procedure Laterality Date   BOWEL RESECTION N/A 08/28/2014   Procedure: SMALL BOWEL RESECTION;  Surgeon: [name], MD;  Location: [place];  Service: General;  Laterality: N/A;   CARDIAC CATHETERIZATION N/A 07/20/2015   Procedure: Left Heart Cath and Coronary Angiography;  Surgeon: [name], MD;  Location: [place] CV [facility];  Service: Cardiovascular;  15% ISR in the RCA stent segment, 20% proximal-mid LAD as well as ostial and proximal circumflex. Normal LV function.   CARDIAC CATHETERIZATION N/A 07/23/2016   Procedure: Left Heart Cath and Coronary Angiography;  Surgeon: [name], MD;  Location: [place] [facility];  Service: Cardiovascular: E 55-65%. ~15% diffuse RCA  ISR, ~20% diffuse pLAD & pCx   CORONARY ANGIOPLASTY  07/06/2012; 08/15/2014   a) 12/'13: PTCA of 80% ISR mRCA - AngioSculpt; b) PTCA of  ISR/thrombosis   CORONARY ANGIOPLASTY WITH STENT PLACEMENT  07-01-2010  dr harding @[facility]    inferior wall MI - 3 Taxus Ion DES (3.0x32mm, 3.0x47mm, 3.5x78mm) to prox and mid RCA   CYSTOSCOPY N/A 01/10/2021   Procedure: CYSTOSCOPY FLEXIBLE;  Surgeon: Ardis Hughs, MD;  Location: [facility];  Service: Urology;  Laterality: N/A;   INCISIONAL HERNIA REPAIR N/A 10/15/2017   Procedure: HERNIA REPAIR INCISIONAL;  Surgeon: [name], MD;  Location: [facility];  Service: General;  Laterality: N/A;   INGUINAL HERNIA REPAIR Bilateral 05/2010   INSERTION OF MESH N/A 10/15/2017   Procedure: INSERTION OF MESH;  Surgeon: [name], MD;  Location: [facility];  Service: General;  Laterality: N/A;   INTRAVASCULAR ULTRASOUND/IVUS Left 04/29/2021   Procedure: Intravascular Ultrasound/IVUS;  Surgeon: Waynetta Sandy, MD;  Location: [place] CV [facility];  Service: Cardiovascular;  Laterality: Left;   KNEE ARTHROSCOPY W/ MENISCAL REPAIR Left 06/2011   LAPAROSCOPIC APPENDECTOMY N/A 08/28/2014   Procedure: APPENDECTOMY LAPAROSCOPIC;  Surgeon: [name], MD;  Location: [place];  Service: General;  Laterality: N/A;   LAPAROSCOPY  08/28/2014   Procedure: LAPAROSCOPY DIAGNOSTIC;  Surgeon: [name], MD;  Location: [place];  Service: General;;   LEFT HEART CATHETERIZATION WITH CORONARY ANGIOGRAM N/A 06/11/2012   Procedure: LEFT HEART CATHETERIZATION WITH CORONARY ANGIOGRAM;  Surgeon: [name], MD;  Location: [place] CATH [facility];  Service: Cardiovascular;  Laterality: N/A;   LEFT HEART CATHETERIZATION WITH CORONARY ANGIOGRAM N/A 04/18/2013   Procedure: LEFT HEART CATHETERIZATION WITH CORONARY ANGIOGRAM;  Surgeon: [name], MD;  Location: [facility] CATH LAB;  Service: Cardiovascular;  Laterality: N/A;   LEFT HEART CATHETERIZATION WITH CORONARY ANGIOGRAM N/A  08/15/2014   Procedure: LEFT HEART CATHETERIZATION WITH CORONARY ANGIOGRAM;  Surgeon: [name], MD;  Location: [facility] CATH LAB;  Service: Cardiovascular;  Laterality: N/A;   LOWER EXTREMITY VENOGRAPHY Left 04/29/2021   Procedure: LOWER EXTREMITY VENOGRAPHY;  Surgeon: Waynetta Sandy, MD;  Location: [place] CV [facility];  Service: Cardiovascular;  Laterality: Left;   PERCUTANEOUS CORONARY STENT INTERVENTION (PCI-S) N/A 07/06/2012   Procedure: PERCUTANEOUS CORONARY STENT INTERVENTION (PCI-S);  Surgeon: [name], MD;  Location: [facility];  Service: Cardiovascular;  Laterality: N/A;   PERIPHERAL VASCULAR THROMBECTOMY Left 04/29/2021   Procedure: PERIPHERAL VASCULAR THROMBECTOMY;  Surgeon: Waynetta Sandy, MD;  Location: [place] CV [facility];  Service: Cardiovascular;  Laterality: Left;   RADIOACTIVE SEED IMPLANT N/A 01/10/2021   Procedure: RADIOACTIVE SEED IMPLANT/BRACHYTHERAPY IMPLANT;  Surgeon: Ardis Hughs, MD;  Location: [facility];  Service: Urology;  Laterality:  N/A;   SEPTOPLASTY  11-01-2001 @ [place]   SPACE OAR INSTILLATION N/A 01/10/2021   Procedure: SPACE OAR INSTILLATION;  Surgeon: Ardis Hughs, MD;  Location: [facility];  Service: Urology;  Laterality: N/A;   TONSILLECTOMY AND ADENOIDECTOMY  age 84   TRANSTHORACIC ECHOCARDIOGRAM  08/15/2014   Moderate concentric LVH. EF 60-65% with no regional WMA. Gr 1 DD, otherwise normal    Short Social History:  Social History   Tobacco Use   Smoking status: Former    Packs/day: 1.00    Years: 15.00    Pack years: 15.00    Types: Cigarettes    Quit date: 06/30/2010    Years since quitting: 10.8   Smokeless tobacco: Current    Types: Snuff  Substance Use Topics   Alcohol use: Yes    Alcohol/week: 7.0 - 14.0 standard drinks    Types: 7 - 14 Shots of liquor per week    Comment: 1-2 bourbon on the rocks per day    Allergies  Allergen Reactions   Crestor [Rosuvastatin] Other  (See Comments)    Severe muscle pain and nausea   Zofran [Ondansetron Hcl] Nausea And Vomiting   Ace Inhibitors Cough   Lipitor [Atorvastatin] Other (See Comments)    myalgia   Codeine Itching    Current Outpatient Medications  Medication Sig Dispense Refill   acetaminophen (TYLENOL) 325 MG tablet Take 650 mg by mouth daily as needed (pain).     aspirin EC 81 MG tablet Take 81 mg by mouth every morning. Swallow whole.     carvedilol (COREG) 12.5 MG tablet Take 1 tablet (12.5 mg total) by mouth 2 (two) times daily. 180 tablet 3   Cholecalciferol (VITAMIN D3 PO) Take 1 capsule by mouth every morning.     Empagliflozin-metFORMIN HCl ER (SYNJARDY XR) 25-1000 MG TB24 Take 1 tablet by mouth every morning.     Evolocumab (REPATHA SURECLICK) 932 MG/ML SOAJ Inject 140 mg into the skin every 14 (fourteen) days. 2 mL 11   furosemide (LASIX) 40 MG tablet TAKE 2 TABLETS BY MOUTH EVERY MORNING AND 1 TABLET IN THE EVENING (Patient taking differently: Take 40-80 mg by mouth See admin instructions. Take one tablet (40 mg) by mouth every morning and two tablets (80 mg) at night) 270 tablet 3   nitroGLYCERIN (NITROSTAT) 0.4 MG SL tablet Place 1 tablet (0.4 mg total) under the tongue every 5 (five) minutes x 3 doses as needed for chest pain. 25 tablet 2   olmesartan (BENICAR) 40 MG tablet Take 1 tablet (40 mg total) by mouth daily. 90 tablet 0   Potassium 99 MG TABS Take 1 tablet by mouth daily.     Propylene Glycol (SYSTANE COMPLETE) 0.6 % SOLN Apply 1 drop to eye daily.     Rivaroxaban (XARELTO) 15 MG TABS tablet Take 1 tablet (15 mg total) by mouth 2 (two) times daily. 42 tablet 3   traMADol (ULTRAM) 50 MG tablet Take 1-2 tablets (50-100 mg total) by mouth every 6 (six) hours as needed for moderate pain. 30 tablet 0   No current facility-administered medications for this visit.    Review of Systems  Constitutional:  Constitutional negative. HENT: HENT negative.  Eyes: Eyes negative.  Respiratory:  Positive for shortness of breath.  Cardiovascular: Positive for leg swelling.  GI: Gastrointestinal negative.  Musculoskeletal: Musculoskeletal negative.  Skin: Skin negative.  Neurological: Neurological negative. Hematologic: Hematologic/lymphatic negative.  Psychiatric: Psychiatric negative.       Objective:  Objective   Vitals:   05/15/21 0838  BP: 112/82  Pulse: 79  Resp: 20  Temp: 97.9 F (36.6 C)  SpO2: 95%    Physical Exam HENT:     Head: Normocephalic.     Nose:     Comments: Wearing a mask Eyes:     Pupils: Pupils are equal, round, and reactive to light.  Pulmonary:     Effort: Pulmonary effort is normal.  Abdominal:     General: Abdomen is flat.     Palpations: Abdomen is soft.  Musculoskeletal:     Right lower leg: No edema.     Left lower leg: Edema present.     Comments: Left lower extremity edema below the knee only bilateral thighs or even in size  Skin:    General: Skin is warm.     Capillary Refill: Capillary refill takes less than 2 seconds.  Neurological:     General: No focal deficit present.  Psychiatric:        Mood and Affect: Mood normal.        Behavior: Behavior normal.        Thought Content: Thought content normal.    Data: No studies today     Assessment/Plan:     55 year old male was recently admitted to the hospital with extensive left lower extremity DVT and PE.  He underwent lower extremity venography and intravascular ultrasound which demonstrated May Thurner's syndrome but unfortunately he had desaturations during the thrombectomy portion of the case and the case had to be terminated and the May Thurner's was never treated.  He was subsequently found to have significant burden of PE was evaluated by pulmonary critical care medicine and now has follow-up with them in 2 weeks.  He continues on Xarelto and from a purely lower extremity DVT standpoint would need for at least 6 months.  He does have a remote history of portal vein  thrombosis at the time he was using testosterone injections.  At the time of his most recent PE and DVT he does have prostate cancer for which she has undergone radiation therapy and he also had COVID 19 just before the diagnosis.  With this picture of multiple clots and inciting factors I will refer him to hematology who has seen in the past.  I discussed with him that he may require hypercoagulable work-up may require anticoagulation indefinitely but we will wait for their advice.  Either way he would likely benefit from stenting of his May Thurner syndrome prior to left knee replacement in March.  If he does require indefinite anticoagulation he would also likely require IVC filter placement at the time of treatment of his May Thurner.  I will have him follow-up in 2 months we can reconvene on his pulmonary status as well as his need for anticoagulation at that time and we can begin to plan stenting of his left common iliac vein in conjunction with possible filter placement for knee replacement.  All of his questions were answered today.     Waynetta Sandy MD [facility]

## 2021-05-16 ENCOUNTER — Other Ambulatory Visit: Payer: Self-pay

## 2021-05-16 DIAGNOSIS — I871 Compression of vein: Secondary | ICD-10-CM

## 2021-05-23 ENCOUNTER — Other Ambulatory Visit: Payer: Self-pay

## 2021-05-23 ENCOUNTER — Ambulatory Visit (INDEPENDENT_AMBULATORY_CARE_PROVIDER_SITE_OTHER): Payer: 59 | Admitting: Pulmonary Disease

## 2021-05-23 ENCOUNTER — Encounter: Payer: Self-pay | Admitting: Pulmonary Disease

## 2021-05-23 VITALS — BP 120/80 | HR 74 | Temp 97.8°F | Ht 73.0 in | Wt 253.0 lb

## 2021-05-23 DIAGNOSIS — I2609 Other pulmonary embolism with acute cor pulmonale: Secondary | ICD-10-CM | POA: Diagnosis not present

## 2021-05-23 NOTE — Progress Notes (Signed)
@Patient  ID: Dylan [name], male    DOB: 07/12/1966, 55 y.o.   MRN: [id number]  Chief Complaint  Patient presents with   Consult    Patient reports that he is doing better since his last episode.     Referring provider: [name], PA-C  HPI:   55 y.o. man whom we are seeing in hospital follow-up of provoked submassive PE.  Recent outpatient vascular surgery note reviewed.  Discharge summary reviewed.  Pulmonary consult note reviewed.  Patient developed COVID mid 04/2021.  About a week later developed shortness of breath.  Lower extremity swelling.  Was undergoing procedure for thrombectomy of DVT.  Sounds like was potentially oversedated, stop breathing, significant oxygen desaturations.  This led to CTA which demonstrated large pulmonary embolus with concern for submassive based on imaging characteristics.  TTE performed showed normal RV size and function but elevated RV pressures consistent with submassive PE.  He was placed on anticoagulation.  Transition to Xarelto.  He has completed 21 day loading dose and continues on 15 mg twice daily.  Overall symptoms have improved.  Still some mild residual dyspnea when walking up hills or stairs.  But overall greatly improved.  Denies any issues with bleeding.  No hemoptysis, hematochezia, melena, hematemesis.  Reviewed CTA chest PE protocol during hospitalization with monitor patient reveals clear lungs bilaterally with large clot burden bilaterally most prominently on the right.  PMH: Hypertension, CAD, diabetes, borderline thrombosis, embolic stroke, prostate cancer Surgical history: Sinus surgery, stent, appendectomy Family history: CAD in first-degree relatives Social history: Former smoker, [employer], quit 2012  Questionaires / Pulmonary Flowsheets:   ACT:  No flowsheet data found.  MMRC: No flowsheet data found.  Epworth:  No flowsheet data found.  Tests:   FENO:  No results found for: NITRICOXIDE  PFT: No  flowsheet data found.  WALK:  No flowsheet data found.  Imaging: Personally reviewed and as per EMR and discussion in this note CT Angio Chest Pulmonary Embolism (PE) W or WO Contrast  Result Date: 04/30/2021 CLINICAL DATA:  Pulmonary embolism, status post partial embolectomy procedure yesterday EXAM: CT ANGIOGRAPHY CHEST WITH CONTRAST TECHNIQUE: Multidetector CT imaging of the chest was performed using the standard protocol during bolus administration of intravenous contrast. Multiplanar CT image reconstructions and MIPs were obtained to evaluate the vascular anatomy. CONTRAST:  151mL OMNIPAQUE IOHEXOL 350 MG/ML SOLN COMPARISON:  None. FINDINGS: Cardiovascular: Satisfactory opacification of the pulmonary arteries to the segmental level. There is a large burden of pulmonary embolus in the distal right and right-sided lobar branch pulmonary arteries (series 6, image 133). There is subsegmental embolus noted in the left lower lobe (series 6, image 151). Global cardiomegaly. Three-vessel coronary artery calcifications. Mild enlargement of the main pulmonary artery measuring up to 3.4 cm. RV LV ratio is mildly elevated, 1.0. No pericardial effusion. Mediastinum/Nodes: No enlarged mediastinal, hilar, or axillary lymph nodes. Thyroid gland, trachea, and esophagus demonstrate no significant findings. Lungs/Pleura: Lungs are clear. No pleural effusion or pneumothorax. Upper Abdomen: No acute abnormality. Musculoskeletal: No chest wall abnormality. No acute or significant osseous findings. Review of the MIP images confirms the above findings. IMPRESSION: 1. Large burden of pulmonary embolus in the distal right and right-sided lobar branch pulmonary arteries. 2. Subsegmental embolus noted in the left lower lobe; presumably the majority of left-sided embolus was extracted during embolectomy procedure. 3. Comparison to prior imaging, if available, would be helpful to assess for change in embolus burden. 4. Global  cardiomegaly with three-vessel coronary  artery calcifications. 5. Mild enlargement of the main pulmonary artery measuring up to 3.4 cm, as can be seen in pulmonary hypertension. RV LV ratio is mildly elevated, 1.0, concerning for right heart strain. Correlate with echocardiographic findings. Electronically Signed   By: Eddie Candle M.D.   On: 04/30/2021 10:21   CARDIAC CATHETERIZATION  Result Date: 04/29/2021 Images from the original result were not included. Patient name: Dylan [name] MRN: [id number] DOB: 17-Nov-1965 Sex: male 04/29/2021 Pre-operative Diagnosis: Extensive left lower extremity DVT Post-operative diagnosis:  Same with May Thurner syndrome Surgeon:  Eda Paschal. Donzetta Matters, MD Procedure Performed: 1.  Ultrasound-guided cannulation left small saphenous vein 2.  IVC venogram 3.  Intravascular ultrasound left popliteal, left femoral, left common femoral, left external iliac, left common iliac veins and IVC 4.  Mechanical thrombectomy left external iliac, left common femoral, left femoral, left popliteal veins with Inari Clottriever 5.  Moderate sedation with fentanyl and Versed for 41 minutes Indications: 55 year old male with recent diagnosis of COVID 14 days ago also undergoing treatment for prostate cancer found to have extensive left lower extremity DVT with severe pain.  He is now indicated for the above-noted procedure. Findings: Small saphenous vein appeared to be dilated with acute thrombus measured approximately 8 mm.  There was clot extending up into the external iliac vein.  There was rouleaux flow through the left common iliac vein and there was a very small lumen at the level of the right common iliac artery crossing consistent with May Thurner syndrome.  Lumen measuring approximately 2 mm.  We performed mechanical thrombectomy returned acute appearing thrombus.  We had planned for stenting.  Unfortunately the patient desaturated to 40% requiring termination of procedure and reversal of  moderate sedation with flumazenil and Narcan.  As the procedure was terminated we did not treat the May Thurner we remove clot on 2 separate passes.  Procedure:  The patient was identified in the holding area and taken to room 8.  The patient was then placed prone on the table and prepped and draped in the usual sterile fashion.  A time out was called.  Ultrasound was used to evaluate the small saphenous vein.  Moderate sedation with fentanyl and Versed was administered.  Patient did have some desaturations initially these recovered.  His saturation probe was on his toe.  We proceeded with the procedure.  We cannulated the small saphenous vein after anesthetizing the area with micropuncture needle followed by wire and sheath.  We then placed a Glidewire advantage this passed easily up to the level of the left common iliac vein.  We placed an 8 [place] sheath.  We able to direct the wire into the IVC and then ultimately into the SVC and right subclavian vein.  We performed intravascular ultrasound from the popliteal all the way to the IVC.  We also performed IVC venogram.  We then elected to perform mechanical thrombectomy.  Patient was given additional 8000 units of heparin on top of his heparin drip.  We then exchanged for an artery sheath.  Will be for mechanical thrombectomy from the external vein down to the popliteal vein on 2 separate passes.  Prior to the second pass a second dose of 100 mcg of fentanyl and 2 mg of Versed was administered given significant discomfort.  With the first pass.  After the second pass the patient was noted to desaturate to 40%.  He was not responding.  We elected to terminate the procedure the bed was brought in  he was flipped onto his back in the recovery position.  Flumazenil and Narcan were administered and the patient was then recovered.  He maintained a pulse throughout this event.  Blood pressure was checked after the event was noted to be 135/72 and patient was responding to  questions appropriately although repeating himself.  A code had been called no code drugs were administered.  The sheath had been accidentally removed and transferring the patient back to his bed there was no bleeding there.  We placed a compressive dressing on his left lower extremity.  He was transferred to recovery in stable condition. Contrast: 10 cc Brandon C. Donzetta Matters, MD [facility] Office: [phone] Pager: [phone]   PERIPHERAL VASCULAR CATHETERIZATION  Result Date: 04/29/2021 Images from the original result were not included. Patient name: Dylan [name] MRN: [id number] DOB: 1966/05/05 Sex: male 04/29/2021 Pre-operative Diagnosis: Extensive left lower extremity DVT Post-operative diagnosis:  Same with May Thurner syndrome Surgeon:  Eda Paschal. Donzetta Matters, MD Procedure Performed: 1.  Ultrasound-guided cannulation left small saphenous vein 2.  IVC venogram 3.  Intravascular ultrasound left popliteal, left femoral, left common femoral, left external iliac, left common iliac veins and IVC 4.  Mechanical thrombectomy left external iliac, left common femoral, left femoral, left popliteal veins with Inari Clottriever 5.  Moderate sedation with fentanyl and Versed for 41 minutes Indications: 55 year old male with recent diagnosis of COVID 14 days ago also undergoing treatment for prostate cancer found to have extensive left lower extremity DVT with severe pain.  He is now indicated for the above-noted procedure. Findings: Small saphenous vein appeared to be dilated with acute thrombus measured approximately 8 mm.  There was clot extending up into the external iliac vein.  There was rouleaux flow through the left common iliac vein and there was a very small lumen at the level of the right common iliac artery crossing consistent with May Thurner syndrome.  Lumen measuring approximately 2 mm.  We performed mechanical thrombectomy returned acute appearing thrombus.  We had planned for  stenting.  Unfortunately the patient desaturated to 40% requiring termination of procedure and reversal of moderate sedation with flumazenil and Narcan.  As the procedure was terminated we did not treat the May Thurner we remove clot on 2 separate passes.  Procedure:  The patient was identified in the holding area and taken to room 8.  The patient was then placed prone on the table and prepped and draped in the usual sterile fashion.  A time out was called.  Ultrasound was used to evaluate the small saphenous vein.  Moderate sedation with fentanyl and Versed was administered.  Patient did have some desaturations initially these recovered.  His saturation probe was on his toe.  We proceeded with the procedure.  We cannulated the small saphenous vein after anesthetizing the area with micropuncture needle followed by wire and sheath.  We then placed a Glidewire advantage this passed easily up to the level of the left common iliac vein.  We placed an 8 [place] sheath.  We able to direct the wire into the IVC and then ultimately into the SVC and right subclavian vein.  We performed intravascular ultrasound from the popliteal all the way to the IVC.  We also performed IVC venogram.  We then elected to perform mechanical thrombectomy.  Patient was given additional 8000 units of heparin on top of his heparin drip.  We then exchanged for an artery sheath.  Will be for mechanical thrombectomy from  the external vein down to the popliteal vein on 2 separate passes.  Prior to the second pass a second dose of 100 mcg of fentanyl and 2 mg of Versed was administered given significant discomfort.  With the first pass.  After the second pass the patient was noted to desaturate to 40%.  He was not responding.  We elected to terminate the procedure the bed was brought in he was flipped onto his back in the recovery position.  Flumazenil and Narcan were administered and the patient was then recovered.  He maintained a pulse throughout  this event.  Blood pressure was checked after the event was noted to be 135/72 and patient was responding to questions appropriately although repeating himself.  A code had been called no code drugs were administered.  The sheath had been accidentally removed and transferring the patient back to his bed there was no bleeding there.  We placed a compressive dressing on his left lower extremity.  He was transferred to recovery in stable condition. Contrast: 10 cc Brandon C. Donzetta Matters, MD [facility] [place] Office: [phone] Pager: [phone]   US Venous Img Lower Unilateral Left  Result Date: 04/26/2021 CLINICAL DATA:  Left knee and calf pain with swelling EXAM: Left LOWER EXTREMITY VENOUS DOPPLER ULTRASOUND TECHNIQUE: Gray-scale sonography with graded compression, as well as color Doppler and duplex ultrasound were performed to evaluate the lower extremity deep venous systems from the level of the common femoral vein and including the common femoral, femoral, profunda femoral, popliteal and calf veins including the posterior tibial, peroneal and gastrocnemius veins when visible. The superficial great saphenous vein was also interrogated. Spectral Doppler was utilized to evaluate flow at rest and with distal augmentation maneuvers in the common femoral, femoral and popliteal veins. COMPARISON:  None. FINDINGS: Contralateral Common Femoral Vein: Respiratory phasicity is normal and symmetric with the symptomatic side. No evidence of thrombus. Normal compressibility. Common Femoral Vein: Positive for occlusive thrombus. Noncompressible. Saphenofemoral Junction: No evidence of thrombus. Normal compressibility and flow on color Doppler imaging. Profunda Femoral Vein: No evidence of thrombus. Normal compressibility and flow on color Doppler imaging. Femoral Vein: Positive for occlusive thrombus. Noncompressible vein. Popliteal Vein: Positive for occlusive thrombus. Noncompressible vein. Calf  Veins: Positive for occlusive thrombus within the gastrocnemius vein in the calf. Poorly visible peroneal and posterior tibial veins. Superficial Great Saphenous Vein: No evidence of thrombus. Normal compressibility. IMPRESSION: Positive for extensive occlusive DVT involving the left common femoral vein, femoral vein, popliteal vein, and gastrocnemius vein. Critical Value/emergent results were called by telephone at the time of interpretation on 04/26/2021 at 7:58 pm to provider [facility] , who verbally acknowledged these results. Electronically Signed   By: Donavan Foil M.D.   On: 04/26/2021 19:58   DG CHEST PORT 1 VIEW  Result Date: 04/29/2021 CLINICAL DATA:  55 year old male with leg pain, history of DVT. EXAM: PORTABLE CHEST 1 VIEW COMPARISON:  Portable chest 04/24/2021 and earlier. FINDINGS: Portable AP semi upright view at 0936 hours. Mildly improved lung volumes. Stable, normal cardiac size and mediastinal contours. Visualized tracheal air column is within normal limits. Allowing for portable technique the lungs are clear. No pneumothorax or pleural effusion. No acute osseous abnormality identified. IMPRESSION: Negative portable chest. Electronically Signed   By: Genevie Ann M.D.   On: 04/29/2021 09:48   DG Chest Portable 1 View  Result Date: 04/24/2021 CLINICAL DATA:  Shortness of breath and COVID-19 positivity, initial encounter EXAM: PORTABLE CHEST 1 VIEW COMPARISON:  12/13/2020 FINDINGS: Cardiac shadow is within normal limits. The lungs are well aerated bilaterally. No focal infiltrate or effusion is seen. Mild interstitial markings are noted in the left lung which may be related to the underlying viral etiology. No bony abnormality is seen. IMPRESSION: Mild interstitial markings in the left mid to lower lung. Electronically Signed   By: Inez Catalina M.D.   On: 04/24/2021 23:01   ECHOCARDIOGRAM COMPLETE  Result Date: 04/30/2021    ECHOCARDIOGRAM REPORT   Patient Name:   Dylan [name] [facility]  Date of Exam: 04/30/2021 Medical Rec #:  [id number]          Height:       73.0 in Accession #:    [id number]         Weight:       250.0 lb Date of Birth:  1966/08/02          BSA:          2.366 m Patient Age:    86 years           BP:           130/78 mmHg Patient Gender: M                  HR:           81 bpm. Exam Location:  Inpatient Procedure: 2D Echo, Cardiac Doppler and Color Doppler Indications:    Pulmonary embolus  History:        Patient has prior history of Echocardiogram examinations, most                 recent 05/31/2019. Previous Myocardial Infarction and CAD,                 Stroke; Risk Factors:Hypertension. COVID + 2 WEEKS AGO.  Sonographer:    Wenda Low Referring Phys: [place]  1. Left ventricular ejection fraction, by estimation, is 60 to 65%. The left ventricle has normal function. The left ventricle has no regional wall motion abnormalities. There is mild left ventricular hypertrophy. Left ventricular diastolic parameters are consistent with Grade II diastolic dysfunction (pseudonormalization).  2. Right ventricular systolic function is normal. The right ventricular size is mildly enlarged. There is moderately elevated pulmonary artery systolic pressure. The estimated right ventricular systolic pressure is 54.6 mmHg.  3. Right atrial size was mildly dilated.  4. The mitral valve is normal in structure. Trivial mitral valve regurgitation. No evidence of mitral stenosis.  5. The aortic valve is normal in structure. Aortic valve regurgitation is not visualized. No aortic stenosis is present.  6. Aortic dilatation noted. There is mild dilatation of the aortic root, measuring 40 mm.  7. The inferior vena cava is normal in size with greater than 50% respiratory variability, suggesting right atrial pressure of 3 mmHg. Comparison(s): Prior images reviewed side by side. Prior ECHO 2020 with aorta 43 mm, RA mildly dilated. FINDINGS  Left Ventricle: Left ventricular  ejection fraction, by estimation, is 60 to 65%. The left ventricle has normal function. The left ventricle has no regional wall motion abnormalities. The left ventricular internal cavity size was normal in size. There is  mild left ventricular hypertrophy. Left ventricular diastolic parameters are consistent with Grade II diastolic dysfunction (pseudonormalization). Right Ventricle: The right ventricular size is mildly enlarged. No increase in right ventricular wall thickness. Right ventricular systolic function is normal. There is moderately elevated pulmonary artery systolic pressure. The tricuspid regurgitant velocity is  3.27 m/s, and with an assumed right atrial pressure of 15 mmHg, the estimated right ventricular systolic pressure is 23.5 mmHg. Left Atrium: Left atrial size was normal in size. Right Atrium: Right atrial size was mildly dilated. Pericardium: There is no evidence of pericardial effusion. Mitral Valve: The mitral valve is normal in structure. Trivial mitral valve regurgitation. No evidence of mitral valve stenosis. MV peak gradient, 3.9 mmHg. The mean mitral valve gradient is 1.0 mmHg. Tricuspid Valve: The tricuspid valve is normal in structure. Tricuspid valve regurgitation is mild . No evidence of tricuspid stenosis. Aortic Valve: The aortic valve is normal in structure. Aortic valve regurgitation is not visualized. No aortic stenosis is present. Aortic valve mean gradient measures 4.0 mmHg. Aortic valve peak gradient measures 6.8 mmHg. Aortic valve area, by VTI measures 4.15 cm. Pulmonic Valve: The pulmonic valve was normal in structure. Pulmonic valve regurgitation is mild. No evidence of pulmonic stenosis. Aorta: Aortic dilatation noted. There is mild dilatation of the aortic root, measuring 40 mm. Venous: The inferior vena cava is normal in size with greater than 50% respiratory variability, suggesting right atrial pressure of 3 mmHg. IAS/Shunts: No atrial level shunt detected by color flow  Doppler.  LEFT VENTRICLE PLAX 2D LVIDd:         4.60 cm     Diastology LVIDs:         3.30 cm     LV e' medial:    6.74 cm/s LV PW:         1.20 cm     LV E/e' medial:  11.0 LV IVS:        1.20 cm     LV e' lateral:   12.20 cm/s LVOT diam:     2.30 cm     LV E/e' lateral: 6.1 LV SV:         123 LV SV Index:   52 LVOT Area:     4.15 cm  LV Volumes (MOD) LV vol d, MOD A2C: 91.4 ml LV vol d, MOD A4C: 81.5 ml LV vol s, MOD A2C: 36.5 ml LV vol s, MOD A4C: 37.1 ml LV SV MOD A2C:     54.9 ml LV SV MOD A4C:     81.5 ml LV SV MOD BP:      52.9 ml RIGHT VENTRICLE RV Basal diam:  3.80 cm RV Mid diam:    4.00 cm RV S prime:     18.80 cm/s TAPSE (M-mode): 2.7 cm LEFT ATRIUM             Index       RIGHT ATRIUM           Index LA diam:        4.40 cm 1.86 cm/m  RA Area:     23.60 cm LA Vol (A2C):   71.3 ml 30.14 ml/m RA Volume:   69.00 ml  29.17 ml/m LA Vol (A4C):   79.8 ml 33.73 ml/m LA Biplane Vol: 76.5 ml 32.34 ml/m  AORTIC VALVE                   PULMONIC VALVE AV Area (Vmax):    3.87 cm    PV Vmax:       0.82 m/s AV Area (Vmean):   3.62 cm    PV Peak grad:  2.7 mmHg AV Area (VTI):     4.15 cm AV Vmax:           130.00 cm/s  AV Vmean:          95.800 cm/s AV VTI:            0.296 m AV Peak Grad:      6.8 mmHg AV Mean Grad:      4.0 mmHg LVOT Vmax:         121.00 cm/s LVOT Vmean:        83.400 cm/s LVOT VTI:          0.296 m LVOT/AV VTI ratio: 1.00  AORTA Ao Root diam: 4.00 cm Ao Asc diam:  3.50 cm MITRAL VALVE               TRICUSPID VALVE MV Area (PHT): 4.60 cm    TR Peak grad:   42.8 mmHg MV Area VTI:   5.51 cm    TR Vmax:        327.00 cm/s MV Peak grad:  3.9 mmHg MV Mean grad:  1.0 mmHg    SHUNTS MV Vmax:       0.99 m/s    Systemic VTI:  0.30 m MV Vmean:      49.2 cm/s   Systemic Diam: 2.30 cm MV Decel Time: 165 msec MV E velocity: 74.40 cm/s MV A velocity: 48.00 cm/s MV E/A ratio:  1.55 Candee Furbish MD Electronically signed by Candee Furbish MD Signature Date/Time: 04/30/2021/3:35:22 PM    Final     Lab  Results: Personally reviewed CBC    Component Value Date/Time   WBC 5.2 05/01/2021 0147   RBC 3.57 (L) 05/01/2021 0147   HGB 11.4 (L) 05/01/2021 0147   HGB 12.6 (L) 10/04/2014 1444   HCT 34.1 (L) 05/01/2021 0147   HCT 38.7 10/04/2014 1444   PLT 188 05/01/2021 0147   PLT 293 10/04/2014 1444   MCV 95.5 05/01/2021 0147   MCV 85.4 10/04/2014 1444   MCH 31.9 05/01/2021 0147   MCHC 33.4 05/01/2021 0147   RDW 13.2 05/01/2021 0147   RDW 14.0 10/04/2014 1444   LYMPHSABS 0.7 04/28/2021 1336   LYMPHSABS 2.6 10/04/2014 1444   MONOABS 0.8 04/28/2021 1336   MONOABS 0.5 10/04/2014 1444   EOSABS 0.1 04/28/2021 1336   EOSABS 0.1 10/04/2014 1444   BASOSABS 0.0 04/28/2021 1336   BASOSABS 0.0 10/04/2014 1444    BMET    Component Value Date/Time   NA 135 05/01/2021 0147   NA 140 09/28/2014 1438   K 3.4 (L) 05/01/2021 0147   K 4.3 09/28/2014 1438   CL 99 05/01/2021 0147   CO2 28 05/01/2021 0147   CO2 25 09/28/2014 1438   GLUCOSE 101 (H) 05/01/2021 0147   GLUCOSE 134 09/28/2014 1438   BUN 12 05/01/2021 0147   BUN 27.4 (H) 10/04/2014 1445   CREATININE 0.84 05/01/2021 0147   CREATININE 1.14 05/29/2016 1157   CREATININE 1.6 (H) 10/04/2014 1445   CALCIUM 8.6 (L) 05/01/2021 0147   CALCIUM 9.5 09/28/2014 1438   GFRNONAA >60 05/01/2021 0147   GFRAA >60 01/13/2019 1552    BNP    Component Value Date/Time   BNP 42.6 10/08/2014 1215    ProBNP    Component Value Date/Time   PROBNP 10.4 04/17/2013 0925    Specialty Problems   None   Allergies  Allergen Reactions   Crestor [Rosuvastatin] Other (See Comments)    Severe muscle pain and nausea   Zofran [Ondansetron Hcl] Nausea And Vomiting   Ace Inhibitors Cough   Lipitor [Atorvastatin] Other (See Comments)  myalgia   Codeine Itching     There is no immunization history on file for this patient.  Past Medical History:  Diagnosis Date   Arthritis    CAD (coronary artery disease) 07/2010; 07/2012; 08/2014    cardiologist--- dr croitoru;  1) 11-28-'11: NSTEMI - 100% RCA - PCI (3 Taxus ION DES 3.0 x 28, 3.0 x 24 & 3.5 x 12 distal-prox); b) 12/ 2013 ISR of RCA stent - -AnngioSculpt PTCA; c) NSTEMI 1/'16: mild ISR with Thrombosis of RCA Stents (in setting of Mesenteric Vein Thrombosis) - Cutting Balloon PTCA; c. Re-look Cath 07/2015 & 07/2016: ~15% ISR in RCA,20% p-mLAD & o-pCx.   Chronic gout    01-07-2021  last flare-up approxl 12-17-2020 bilteral great toes   DM type 2 (diabetes mellitus, type 2) ([place])    followed by pcp   (01-07-2021 per pt does not check blood sugar at home)   Edema of both lower extremities    01-07-2021  pt states wears compression hose   Essential hypertension    followed by pcp and cardiology   History of concussion    01-07-2021  per pt x4 as child, last one age 52 without residual   History of CVA with residual deficit 76/54/6503   embolic left MCA infarct post PCI stenting approx one hour  (01-07-2021 per pt has residual minimal intermittant aphasia )   History of non-ST elevation myocardial infarction (NSTEMI) 07-01-2010  and 01/ 2016   History of prostatitis    History of resection of small bowel 08/28/2014   small bowel perforation,  s/p resction    History of retinal tear 04/2013   right micro-rupture , no occlusion, retinal artery branch w/ vision loss,  per imaging no infarct/ occlusion   History of thrombosis 08/2014   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, provoked by testosterone injection,  08-09-2014 s/p thrombolytic lysis VIR    Hyperlipidemia with target LDL less than 70 09/01/2014   Nocturia    PAC (premature atrial contraction)    pt hx palpitations, event monitor 08-18-2017  showed NS w/ occasional PACs, rare PVCs   Prostate cancer [facility]) urologist-- dr herrick/  oncologist-- dr Tammi Klippel   dx 09-04-2020,  Stage T1c, Gleason 4+3    S/P drug eluting coronary stent placement 07/01/2010   DES x3  to prox and distal RCA   S/P percutaneous  transluminal coronary angioplasty 12/ 2013 and 01/ 2016   for ISR  RCA    Tobacco History: Social History   Tobacco Use  Smoking Status Former   Packs/day: 1.00   Years: 15.00   Pack years: 15.00   Types: Cigarettes   Quit date: 06/30/2010   Years since quitting: 10.9  Smokeless Tobacco Current   Types: Snuff   Ready to quit: Not Answered Counseling given: Not Answered   Continue to not smoke  Outpatient Encounter Medications as of 05/23/2021  Medication Sig   acetaminophen (TYLENOL) 325 MG tablet Take 650 mg by mouth daily as needed (pain).   aspirin EC 81 MG tablet Take 81 mg by mouth every morning. Swallow whole.   carvedilol (COREG) 12.5 MG tablet Take 1 tablet (12.5 mg total) by mouth 2 (two) times daily.   Cholecalciferol (VITAMIN D3 PO) Take 1 capsule by mouth every morning.   Empagliflozin-metFORMIN HCl ER (SYNJARDY XR) 25-1000 MG TB24 Take 1 tablet by mouth every morning.   Evolocumab (REPATHA SURECLICK) 546 MG/ML SOAJ Inject 140 mg into the skin every  14 (fourteen) days.   furosemide (LASIX) 40 MG tablet TAKE 2 TABLETS BY MOUTH EVERY MORNING AND 1 TABLET IN THE EVENING (Patient taking differently: Take 40-80 mg by mouth See admin instructions. Take one tablet (40 mg) by mouth every morning and two tablets (80 mg) at night)   nitroGLYCERIN (NITROSTAT) 0.4 MG SL tablet Place 1 tablet (0.4 mg total) under the tongue every 5 (five) minutes x 3 doses as needed for chest pain.   olmesartan (BENICAR) 40 MG tablet Take 1 tablet (40 mg total) by mouth daily.   Potassium 99 MG TABS Take 1 tablet by mouth daily.   Propylene Glycol (SYSTANE COMPLETE) 0.6 % SOLN Apply 1 drop to eye daily.   Rivaroxaban (XARELTO) 15 MG TABS tablet Take 1 tablet (15 mg total) by mouth 2 (two) times daily.   traMADol (ULTRAM) 50 MG tablet Take 1-2 tablets (50-100 mg total) by mouth every 6 (six) hours as needed for moderate pain.   No facility-administered encounter medications on file as of  05/23/2021.     Review of Systems  Review of Systems  No chest pain with exertion.  No orthopnea or PND.  Comprehensive review of systems otherwise negative. Physical Exam  BP 120/80 (BP Location: Left Arm, Patient Position: Sitting, Cuff Size: Normal)   Pulse 74   Temp 97.8 F (36.6 C) (Oral)   Ht 6\' 1"  (1.854 m)   Wt 253 lb (114.8 kg)   SpO2 96%   BMI 33.38 kg/m   Wt Readings from Last 5 Encounters:  05/23/21 253 lb (114.8 kg)  05/15/21 252 lb (114.3 kg)  04/29/21 250 lb (113.4 kg)  04/24/21 250 lb (113.4 kg)  01/10/21 270 lb 1.6 oz (122.5 kg)    BMI Readings from Last 5 Encounters:  05/23/21 33.38 kg/m  05/15/21 33.25 kg/m  04/29/21 32.98 kg/m  04/24/21 32.98 kg/m  01/10/21 35.64 kg/m     Physical Exam General: Well-appearing, no acute distress Eyes: EOMI, icterus Neck: Supple, no JVP Cardiovascular: Regular rate and rhythm, no murmur appreciated Pulmonary: Clear to auscultation bilaterally, normal work of breathing, on room air Abdomen: Nondistended, bowel sounds present MSK: No synovitis, joint effusion Neuro: Normal gait, no weakness Psych: Normal mood, full affect   Assessment & Plan:   Submassive PE: Provoked in the setting of COVID-19, prostate cancer.  Also with May Thurner which puts him at risk.  TTE with normal RV function but increased estimated RV pressures.  Symptoms of dyspnea are improving.  Reassuring that clots are resolving.  He has completed a 21-day loading dose of Xarelto 15 mg twice daily.  Discussed reducing to 20 mg daily.  He seems a bit leery of this.  Will discuss with vascular surgery to make sure there is not a reason to keep at the higher dose.  Not aware of any reason.  Plan to decrease to 20 mg in the coming days.  We will repeat TTE at 3 months to ensure decreased pressure on RV.  Dyspnea on exertion: Gradually improving.  Some residual when walking up the hill from his property where chickens are.  Reassuring as to  resolution of clots.   Return in about 2 months (around 07/23/2021).   Lanier Clam, MD 05/23/2021   This appointment required 65 minutes of patient care (this includes precharting, chart review, review of results, face-to-face care, etc.).

## 2021-05-23 NOTE — Patient Instructions (Signed)
Nice to meet you  I am glad you are feeling better.  This is very reassuring to me that the blood clots are improving.  I recommend a repeat ultrasound at 3 months after the incident, we will plan for late December 2022 or early January 2023.  I would like to see you after the heart ultrasound to go over results.  I want to ensure that the stress that the blood clot called on your heart has improved.  I will message Dr. Donzetta Matters.  I will clarify the correct Xarelto dose.  Return to clinic in 2 months or sooner as needed after echocardiogram/heart ultrasound

## 2021-05-28 ENCOUNTER — Telehealth: Payer: Self-pay | Admitting: Pulmonary Disease

## 2021-05-28 MED ORDER — RIVAROXABAN 20 MG PO TABS: 20.0000 mg | ORAL_TABLET | Freq: Every day | ORAL | 4 refills | Status: DC

## 2021-05-28 NOTE — Telephone Encounter (Signed)
Discussed with patient at recent follow-up visit that Xarelto 15 mg twice daily as a loading dose for 21 days followed by Xarelto 20 mg daily.  He has completed the 21-day loading dose and continues on 15 mg twice a day.  This puts him at increased risk of bleeding without additional therapeutic benefit.  Reached out to his vascular surgeon to make sure there is no specific indication for the higher dose.  After conferring with both agreed that the Xarelto 20 mg daily is the appropriate dose for his VTE and pulmonary embolus.  New prescription for Xarelto 20 mg daily sent to pharmacy.  He is to discontinue Xarelto 15 mg twice daily. Attempted to contact patient. Went to [facility]. Routed to staff for re-call to ensure he hears instructions as above.

## 2021-06-04 ENCOUNTER — Inpatient Hospital Stay: Payer: 59 | Admitting: Adult Health

## 2021-06-05 NOTE — Telephone Encounter (Signed)
Called and spoke with patient. He verbalized understanding. He stated that he has been on the Xarelto 20mg  for about a week now and feels ok. Denied any side effects. Pharmacy was able to change the RX to a 90 day supply which is more cost effective for him.   I advised him to call us if he developed any concerns. He verbalized understanding.   Nothing further needed at time of call.

## 2021-06-13 ENCOUNTER — Other Ambulatory Visit: Payer: Self-pay

## 2021-06-13 ENCOUNTER — Inpatient Hospital Stay: Payer: 59 | Attending: Adult Health | Admitting: Adult Health

## 2021-06-13 ENCOUNTER — Encounter: Payer: Self-pay | Admitting: Adult Health

## 2021-06-13 ENCOUNTER — Inpatient Hospital Stay: Payer: 59

## 2021-06-13 VITALS — BP 126/74 | HR 76 | Temp 97.7°F | Resp 18 | Ht 73.0 in | Wt 257.9 lb

## 2021-06-13 DIAGNOSIS — Z86718 Personal history of other venous thrombosis and embolism: Secondary | ICD-10-CM | POA: Diagnosis not present

## 2021-06-13 DIAGNOSIS — C61 Malignant neoplasm of prostate: Secondary | ICD-10-CM | POA: Diagnosis not present

## 2021-06-13 DIAGNOSIS — Z87891 Personal history of nicotine dependence: Secondary | ICD-10-CM | POA: Diagnosis not present

## 2021-06-13 DIAGNOSIS — I82492 Acute embolism and thrombosis of other specified deep vein of left lower extremity: Secondary | ICD-10-CM

## 2021-06-13 DIAGNOSIS — Z86711 Personal history of pulmonary embolism: Secondary | ICD-10-CM | POA: Diagnosis not present

## 2021-06-13 LAB — ANTITHROMBIN III: AntiThromb III Func: 128 % — ABNORMAL HIGH (ref 75–120)

## 2021-06-13 NOTE — Progress Notes (Signed)
SURVIVORSHIP VISIT:   BRIEF ONCOLOGIC HISTORY:  Oncology History  Malignant neoplasm of prostate ([place])  08/29/2020 Imaging   He underwent an MRI of the prostate completed in January 2022 which showed a showed PI-RADS 3 diffuse lesion across the entire prostate.    09/04/2020 Cancer Staging   Staging form: Prostate, AJCC 8th Edition - Clinical stage from 09/04/2020: Stage IIIA (cT1c, cN0, cM0, PSA: 25, Grade Group: 3) - Signed by Freeman Caldron, PA-C on 10/12/2020 Histopathologic type: Adenocarcinoma, NOS Stage prefix: Initial diagnosis Prostate specific antigen (PSA) range: 20 or greater Gleason primary pattern: 4 Gleason secondary pattern: 3 Gleason score: 7 Histologic grading system: 5 grade system Number of biopsy cores examined: 12 Number of biopsy cores positive: 10 Location of positive needle core biopsies: Both sides    10/12/2020 Initial Diagnosis   Malignant neoplasm of prostate ([place])   01/10/2021 - 03/01/2021 Radiation Therapy   01/10/2021: Insertion of radioactive I-125 seeds into the prostate gland.;110 Gy, boost therapy  01/31/21-03/01/2021 The prostate, seminal vesicles, and pelvic lymph nodes were treated to 45 Gy in 25 fractions of 1.8 Gy, to supplement an up-front prostate seed implant boost of 110 Gy to achieve a total nominal dose of 155 Gy.     INTERVAL HISTORY:  Dylan [name] to review his history of prostate cancer.  He is status post brachytherapy and IMRT.  He tolerated that well.  He notes he has had no stream changes.  He has no erectile dysfunction.  He most recently underwent PSA recheck and follow-up with Dr. Louis Meckel in July 2022 and his PSA was 2.56.  Overall, Dylan [name] reports feeling quite well.  Dylan [name] was hospitalized from April 28, 2021 through May 01, 2021.  He has a history of May Thurner syndrome.  He had left leg DVT and pulmonary embolism.  He had COVID the week prior. This required a heparin drip and he was transitioned to Xarelto prior to  discharge.  He has completed his starter pack of 15mg  BID and is now on 20 mg daily. Of note he has a history of portal vein thrombosis that was diagnosed in 2016.  This was presumably provoked by testosterone replacement therapy.   REVIEW OF SYSTEMS:  Review of Systems  Constitutional:  Negative for appetite change, chills, fatigue, fever and unexpected weight change.  HENT:   Negative for hearing loss, lump/mass and trouble swallowing.   Eyes:  Negative for eye problems and icterus.  Respiratory:  Negative for chest tightness, cough and shortness of breath.   Cardiovascular:  Negative for chest pain, leg swelling and palpitations.  Gastrointestinal:  Negative for abdominal distention, abdominal pain, constipation, diarrhea, nausea and vomiting.  Endocrine: Negative for hot flashes.  Genitourinary:  Negative for difficulty urinating.   Musculoskeletal:  Negative for arthralgias.  Skin:  Negative for itching and rash.  Neurological:  Negative for dizziness, extremity weakness, headaches and numbness.  Hematological:  Negative for adenopathy. Does not bruise/bleed easily.  Psychiatric/Behavioral:  Negative for depression. The patient is not nervous/anxious.   Breast: Denies any new nodularity, masses, tenderness, nipple changes, or nipple discharge.      ONCOLOGY TREATMENT TEAM:  1. Urologist:  Dr. Louis Meckel at [facility] 2. Medical Oncologist/Hematologist: Dr. Alen Blew  3. Radiation Oncologist: Dr. Tammi Klippel    PAST MEDICAL/SURGICAL HISTORY:  Past Medical History:  Diagnosis Date   Arthritis    CAD (coronary artery disease) 07/2010; 07/2012; 08/2014   cardiologist--- dr croitoru;  1) 11-28-'11: NSTEMI - 100% RCA - PCI (  3 Taxus ION DES 3.0 x 28, 3.0 x 24 & 3.5 x 12 distal-prox); b) 12/ 2013 ISR of RCA stent - -AnngioSculpt PTCA; c) NSTEMI 1/'16: mild ISR with Thrombosis of RCA Stents (in setting of Mesenteric Vein Thrombosis) - Cutting Balloon PTCA; c. Re-look Cath 07/2015 & 07/2016:  ~15% ISR in RCA,20% p-mLAD & o-pCx.   Chronic gout    01-07-2021  last flare-up approxl 12-17-2020 bilteral great toes   DM type 2 (diabetes mellitus, type 2) ([place])    followed by pcp   (01-07-2021 per pt does not check blood sugar at home)   Edema of both lower extremities    01-07-2021  pt states wears compression hose   Essential hypertension    followed by pcp and cardiology   History of concussion    01-07-2021  per pt x4 as child, last one age 59 without residual   History of CVA with residual deficit 95/62/1308   embolic left MCA infarct post PCI stenting approx one hour  (01-07-2021 per pt has residual minimal intermittant aphasia )   History of non-ST elevation myocardial infarction (NSTEMI) 07-01-2010  and 01/ 2016   History of prostatitis    History of resection of small bowel 08/28/2014   small bowel perforation,  s/p resction    History of retinal tear 04/2013   right micro-rupture , no occlusion, retinal artery branch w/ vision loss,  per imaging no infarct/ occlusion   History of thrombosis 08/2014   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, provoked by testosterone injection,  08-09-2014 s/p thrombolytic lysis VIR    Hyperlipidemia with target LDL less than 70 09/01/2014   Nocturia    PAC (premature atrial contraction)    pt hx palpitations, event monitor 08-18-2017  showed NS w/ occasional PACs, rare PVCs   Prostate cancer [facility]) urologist-- dr herrick/  oncologist-- dr Tammi Klippel   dx 09-04-2020,  Stage T1c, Gleason 4+3    S/P drug eluting coronary stent placement 07/01/2010   DES x3  to prox and distal RCA   S/P percutaneous transluminal coronary angioplasty 12/ 2013 and 01/ 2016   for ISR  RCA   Past Surgical History:  Procedure Laterality Date   BOWEL RESECTION N/A 08/28/2014   Procedure: SMALL BOWEL RESECTION;  Surgeon: [name], MD;  Location: [place];  Service: General;  Laterality: N/A;   CARDIAC CATHETERIZATION N/A 07/20/2015    Procedure: Left Heart Cath and Coronary Angiography;  Surgeon: [name], MD;  Location: [place] [facility];  Service: Cardiovascular;  15% ISR in the RCA stent segment, 20% proximal-mid LAD as well as ostial and proximal circumflex. Normal LV function.   CARDIAC CATHETERIZATION N/A 07/23/2016   Procedure: Left Heart Cath and Coronary Angiography;  Surgeon: [name], MD;  Location: [place] [facility];  Service: Cardiovascular: E 55-65%. ~15% diffuse RCA ISR, ~20% diffuse pLAD & pCx   CORONARY ANGIOPLASTY  07/06/2012; 08/15/2014   a) 12/'13: PTCA of 80% ISR mRCA - AngioSculpt; b) PTCA of  ISR/thrombosis   CORONARY ANGIOPLASTY WITH STENT PLACEMENT  07-01-2010  dr harding @[facility]    inferior wall MI - 3 Taxus Ion DES (3.0x5mm, 3.0x46mm, 3.5x19mm) to prox and mid RCA   CYSTOSCOPY N/A 01/10/2021   Procedure: CYSTOSCOPY FLEXIBLE;  Surgeon: Ardis Hughs, MD;  Location[facility];  Service: Urology;  Laterality: N/A;   INCISIONAL HERNIA REPAIR N/A 10/15/2017   Procedure: HERNIA REPAIR INCISIONAL;  Surgeon: [name],  MD;  Location: [facility];  Service: General;  Laterality: N/A;   INGUINAL HERNIA REPAIR Bilateral 05/2010   INSERTION OF MESH N/A 10/15/2017   Procedure: INSERTION OF MESH;  Surgeon: [name], MD;  Location: [facility];  Service: General;  Laterality: N/A;   INTRAVASCULAR ULTRASOUND/IVUS Left 04/29/2021   Procedure: Intravascular Ultrasound/IVUS;  Surgeon: Waynetta Sandy, MD;  Location: [place] [facility];  Service: Cardiovascular;  Laterality: Left;   KNEE ARTHROSCOPY W/ MENISCAL REPAIR Left 06/2011   LAPAROSCOPIC APPENDECTOMY N/A 08/28/2014   Procedure: APPENDECTOMY LAPAROSCOPIC;  Surgeon: [name], MD;  Location: [place];  Service: General;  Laterality: N/A;   LAPAROSCOPY  08/28/2014   Procedure: LAPAROSCOPY DIAGNOSTIC;  Surgeon: [name], MD;  Location: [place];  Service: General;;   LEFT HEART CATHETERIZATION WITH CORONARY  ANGIOGRAM N/A 06/11/2012   Procedure: LEFT HEART CATHETERIZATION WITH CORONARY ANGIOGRAM;  Surgeon: [name], MD;  Location: [place] [facility];  Service: Cardiovascular;  Laterality: N/A;   LEFT HEART CATHETERIZATION WITH CORONARY ANGIOGRAM N/A 04/18/2013   Procedure: LEFT HEART CATHETERIZATION WITH CORONARY ANGIOGRAM;  Surgeon: [name], MD;  Location: [facility] C[facility];  Service: Cardiovascular;  Laterality: N/A;   LEFT HEART CATHETERIZATION WITH CORONARY ANGIOGRAM N/A 08/15/2014   Procedure: LEFT HEART CATHETERIZATION WITH CORONARY ANGIOGRAM;  Surgeon: [name], MD;  Location: [facility] C[facility] LAB;  Service: Cardiovascular;  Laterality: N/A;   LOWER EXTREMITY VENOGRAPHY Left 04/29/2021   Procedure: LOWER EXTREMITY VENOGRAPHY;  Surgeon: Waynetta Sandy, MD;  Location: [place] [facility];  Service: Cardiovascular;  Laterality: Left;   PERCUTANEOUS CORONARY STENT INTERVENTION (PCI-S) N/A 07/06/2012   Procedure: PERCUTANEOUS CORONARY STENT INTERVENTION (PCI-S);  Surgeon: [name], MD;  Location: [facility] C[facility] LAB;  Service: Cardiovascular;  Laterality: N/A;   PERIPHERAL VASCULAR THROMBECTOMY Left 04/29/2021   Procedure: PERIPHERAL VASCULAR THROMBECTOMY;  Surgeon: Waynetta Sandy, MD;  Location: [place] [facility];  Service: Cardiovascular;  Laterality: Left;   RADIOACTIVE SEED IMPLANT N/A 01/10/2021   Procedure: RADIOACTIVE SEED IMPLANT/BRACHYTHERAPY IMPLANT;  Surgeon: Ardis Hughs, MD;  Location: [facility];  Service: Urology;  Laterality: N/A;   SEPTOPLASTY  11-01-2001 @ [place]   SPACE OAR INSTILLATION N/A 01/10/2021   Procedure: SPACE OAR INSTILLATION;  Surgeon: Ardis Hughs, MD;  Location: [facility];  Service: Urology;  Laterality: N/A;   TONSILLECTOMY AND ADENOIDECTOMY  age 25   TRANSTHORACIC ECHOCARDIOGRAM  08/15/2014   Moderate concentric LVH. EF 60-65% with no regional WMA. Gr 1 DD, otherwise normal     ALLERGIES:  Allergies   Allergen Reactions   Crestor [Rosuvastatin] Other (See Comments)    Severe muscle pain and nausea   Zofran [Ondansetron Hcl] Nausea And Vomiting   Ace Inhibitors Cough   Lipitor [Atorvastatin] Other (See Comments)    myalgia   Codeine Itching     CURRENT MEDICATIONS:  Outpatient Encounter Medications as of 06/13/2021  Medication Sig Note   acetaminophen (TYLENOL) 325 MG tablet Take 650 mg by mouth daily as needed (pain).    aspirin EC 81 MG tablet Take 81 mg by mouth every morning. Swallow whole.    carvedilol (COREG) 12.5 MG tablet Take 1 tablet (12.5 mg total) by mouth 2 (two) times daily.    Cholecalciferol (VITAMIN D3 PO) Take 1 capsule by mouth every morning.    Empagliflozin-metFORMIN HCl ER (SYNJARDY XR) 25-1000 MG TB24 Take 1 tablet by mouth every morning.    Evolocumab (REPATHA SURECLICK) 782 MG/ML  SOAJ Inject 140 mg into the skin every 14 (fourteen) days.    furosemide (LASIX) 40 MG tablet TAKE 2 TABLETS BY MOUTH EVERY MORNING AND 1 TABLET IN THE EVENING (Patient taking differently: Take 40-80 mg by mouth See admin instructions. Take one tablet (40 mg) by mouth every morning and two tablets (80 mg) at night)    olmesartan (BENICAR) 40 MG tablet Take 1 tablet (40 mg total) by mouth daily.    Potassium 99 MG TABS Take 1 tablet by mouth daily. 04/28/2021: OTC - pt needs to purchase more   Propylene Glycol (SYSTANE COMPLETE) 0.6 % SOLN Apply 1 drop to eye daily.    rivaroxaban (XARELTO) 20 MG TABS tablet Take 1 tablet (20 mg total) by mouth daily with supper.    nitroGLYCERIN (NITROSTAT) 0.4 MG SL tablet Place 1 tablet (0.4 mg total) under the tongue every 5 (five) minutes x 3 doses as needed for chest pain. (Patient not taking: Reported on 06/13/2021)    traMADol (ULTRAM) 50 MG tablet Take 1-2 tablets (50-100 mg total) by mouth every 6 (six) hours as needed for moderate pain.    No facility-administered encounter medications on file as of 06/13/2021.     ONCOLOGIC FAMILY  HISTORY:  Family History  Problem Relation Age of Onset   Atrial fibrillation Mother    Mitral valve prolapse Mother    Coronary artery disease Father        CABG, aortic aneursym,   Heart attack Father    Heart attack Paternal Uncle    Heart attack Paternal Grandfather      SOCIAL HISTORY:  Social History   Socioeconomic History   Marital status: Married    Spouse name: Curt Bears   Number of children: 4   Years of education: 12   Highest education level: Not on file  Occupational History   Occupation: Pharmacologist: [employer]  Tobacco Use   Smoking status: Former    Packs/day: 1.00    Years: 15.00    Pack years: 15.00    Types: Cigarettes    Quit date: 06/30/2010    Years since quitting: 10.9   Smokeless tobacco: Current    Types: Snuff  Vaping Use   Vaping Use: Never used  Substance and Sexual Activity   Alcohol use: Yes    Alcohol/week: 7.0 - 14.0 standard drinks    Types: 7 - 14 Shots of liquor per week    Comment: 1-2 bourbon on the rocks per day   Drug use: No   Sexual activity: Yes    Birth control/protection: Surgical    Comment: vasectomy  Other Topics Concern   Not on file  Social History Narrative   Not on file   Social Determinants of Health   Financial Resource Strain: Not on file  Food Insecurity: Not on file  Transportation Needs: Not on file  Physical Activity: Not on file  Stress: Not on file  Social Connections: Not on file  Intimate Partner Violence: Not on file     OBSERVATIONS/OBJECTIVE:  BP 126/74 (BP Location: Left Arm, Patient Position: Sitting)   Pulse 76   Temp 97.7 F (36.5 C) (Tympanic)   Resp 18   Ht 6\' 1"  (1.854 m)   Wt 257 lb 14.4 oz (117 kg)   SpO2 99%   BMI 34.03 kg/m  GENERAL: Patient is a well appearing male in no acute distress HEENT:  Sclerae anicteric.  Mask in place.  Neck is supple.  NODES:  No cervical, supraclavicular, or axillary lymphadenopathy palpated.  LUNGS:  Clear to auscultation  bilaterally.  No wheezes or rhonchi. HEART:  Regular rate and rhythm. No murmur appreciated. ABDOMEN:  Soft, nontender.  Positive, normoactive bowel sounds. No organomegaly palpated. MSK:  No focal spinal tenderness to palpation. Full range of motion bilaterally in the upper extremities. EXTREMITIES:  No peripheral edema.   SKIN:  Clear with no obvious rashes or skin changes. No nail dyscrasia. NEURO:  Nonfocal. Well oriented.  Appropriate affect.   LABORATORY DATA:  None for this visit.  DIAGNOSTIC IMAGING:  None for this visit.      ASSESSMENT AND PLAN:  Mr.. [name] is a pleasant 55 y.o. male with stage IIIA prostate cancer s/p brachytherapy to the prostate.     1. Stage IIIA prostate cancer:  Dylan [name] is doing quite well today.  He notes that he opted for brachytherapy and IMRT because he did not want any issues with erectile dysfunction.  He opted to forego prostatectomy and androgen deprivation therapy.  He is not having any urinary or erectile dysfunction issues.  He is doing quite well his most recent PSA was 2.56 which is wonderful.  He is following up with Dr. Louis Meckel in January 2023.   Today, a comprehensive survivorship care plan and treatment summary was reviewed with the patient today detailing his prostate cancer diagnosis, treatment course, potential late/long-term effects of treatment, appropriate follow-up care with recommendations for the future, and patient education resources.  A copy of this summary, along with a letter will be sent to the patient's primary care provider via mail/fax/In Basket message after today's visit.    2. Deep vein thrombosis: Genie will continue on Xarelto twice daily.  He has follow-up with vascular surgery later this year.  At that point he will undergo ultrasound and possible stent placement.  He may also need an IVC filter in the future.  Given that this is his second blood clot, I reviewed this with Dr. Alen Blew and we will go ahead and  get a hypercoagulable panel.  This is to discern the length of time for his anticoagulation.  We did review that he would be able to come off of his Xarelto prior to his knee surgery and he could call our office once he has his surgery scheduled and we can give him advice and recommendations on how to go about that.  Dr. Alen Blew will see him in 6 months and follow-up to discuss his Xarelto at that point.  3 Cancer screening:  Due to Dylan [name] history and age, he should receive screening for skin cancers, colon cancer, and oral cancer.  The information and recommendations are listed on the patient's comprehensive care plan/treatment summary and were reviewed in detail with the patient.    4. Health maintenance and wellness promotion: Dylan [name] was encouraged to consume 5-7 servings of fruits and vegetables per day. We reviewed the "Nutrition Rainbow" handout. He was also encouraged to engage in moderate to vigorous exercise for 30 minutes per day most days of the week. We discussed the [employer] fitness program, which is designed for cancer survivors to help them become more physically fit after cancer treatments.  he was instructed to limit his alcohol consumption and to abstain from tobacco use.     5. Support services/counseling: It is not uncommon for this period of the patient's cancer care trajectory to be one of many emotions and stressors.  He was given information regarding  our available services and encouraged to contact me with any questions or for help enrolling in any of our support group/programs.    Follow up instructions:    -Return to cancer center in 6 months for f/u with Dr. Alen Blew  - Follow-up with Dr. Louis Meckel 08/2021 -She is welcome to return back to the Survivorship Clinic at any time; no additional follow-up needed at this time.  -Consider referral back to survivorship as a long-term survivor for continued surveillance  The patient was provided an opportunity to ask  questions and all were answered. The patient agreed with the plan and demonstrated an understanding of the instructions.   Total encounter time: 50 minutes spent in chart review,  lab review, order entry, face-to-face visit time, care coordination, and documentation of the encounter.  Wilber Bihari, NP 06/13/21 10:13 AM Medical Oncology and Hematology [facility] [place], [place] [address] Tel. [phone]    Fax. [phone]  *Total Encounter Time as defined by the Centers for Medicare and Medicaid Services includes, in addition to the face-to-face time of a patient visit (documented in the note above) non-face-to-face time: obtaining and reviewing outside history, ordering and reviewing medications, tests or procedures, care coordination (communications with other health care professionals or caregivers) and documentation in the medical record.

## 2021-06-14 LAB — DRVVT CONFIRM: dRVVT Confirm: 1.8 ratio — ABNORMAL HIGH (ref 0.8–1.2)

## 2021-06-14 LAB — PROTEIN S, TOTAL: Protein S Ag, Total: 135 % (ref 60–150)

## 2021-06-14 LAB — BETA-2-GLYCOPROTEIN I ABS, IGG/M/A
Beta-2 Glyco I IgG: <9 GPI IgG units (ref 0–20)
Beta-2-Glycoprotein I IgA: <9 GPI IgA units (ref 0–25)
Beta-2-Glycoprotein I IgM: <9 GPI IgM units (ref 0–32)

## 2021-06-14 LAB — HOMOCYSTEINE: Homocysteine: 29.5 umol/L — ABNORMAL HIGH (ref 0.0–14.5)

## 2021-06-14 LAB — LUPUS ANTICOAGULANT PANEL
DRVVT: 112.2 s — ABNORMAL HIGH (ref 0.0–47.0)
PTT Lupus Anticoagulant: 39.3 s (ref 0.0–51.9)

## 2021-06-14 LAB — PROTEIN C ACTIVITY: Protein C Activity: 160 % (ref 73–180)

## 2021-06-14 LAB — PROTEIN C, TOTAL: Protein C, Total: 124 % (ref 60–150)

## 2021-06-14 LAB — DRVVT MIX: dRVVT Mix: 73.6 s — ABNORMAL HIGH (ref 0.0–40.4)

## 2021-06-14 LAB — PROTEIN S ACTIVITY: Protein S Activity: 158 % — ABNORMAL HIGH (ref 63–140)

## 2021-06-18 LAB — CARDIOLIPIN ANTIBODIES, IGG, IGM, IGA
Anticardiolipin IgA: <9 U/mL (ref 0–11)
Anticardiolipin IgG: <9 GPL U/mL (ref 0–14)
Anticardiolipin IgM: 35 [MPL'U]/mL — ABNORMAL HIGH (ref 0–12)

## 2021-06-19 LAB — FACTOR 5 LEIDEN

## 2021-06-21 LAB — PROTHROMBIN GENE MUTATION

## 2021-06-25 ENCOUNTER — Telehealth: Payer: Self-pay

## 2021-06-25 DIAGNOSIS — E785 Hyperlipidemia, unspecified: Secondary | ICD-10-CM

## 2021-06-25 NOTE — Telephone Encounter (Signed)
Called and lmom pt that labs were needed to appeal repatha denial

## 2021-07-03 ENCOUNTER — Encounter: Payer: Self-pay | Admitting: Cardiovascular Disease

## 2021-07-04 ENCOUNTER — Encounter: Payer: Self-pay | Admitting: Adult Health

## 2021-07-05 ENCOUNTER — Telehealth: Payer: Self-pay | Admitting: *Deleted

## 2021-07-05 NOTE — Telephone Encounter (Signed)
Preoperative risk evaluation signed by Dr. Alen Blew & faxed to [facility], fax confirmation received.

## 2021-07-10 ENCOUNTER — Other Ambulatory Visit: Payer: Self-pay | Admitting: Cardiovascular Disease

## 2021-07-12 ENCOUNTER — Ambulatory Visit (HOSPITAL_COMMUNITY): Payer: 59 | Attending: Cardiology

## 2021-07-12 ENCOUNTER — Other Ambulatory Visit: Payer: Self-pay

## 2021-07-12 DIAGNOSIS — I2609 Other pulmonary embolism with acute cor pulmonale: Secondary | ICD-10-CM | POA: Insufficient documentation

## 2021-07-12 LAB — ECHOCARDIOGRAM LIMITED
Area-P 1/2: 4.8 cm2
S' Lateral: 3.4 cm

## 2021-07-15 ENCOUNTER — Other Ambulatory Visit: Payer: Self-pay | Admitting: Pulmonary Disease

## 2021-07-15 DIAGNOSIS — I2609 Other pulmonary embolism with acute cor pulmonale: Secondary | ICD-10-CM

## 2021-07-15 NOTE — Progress Notes (Signed)
Echocardiogram looks improved. However, still some stress on the heart. I discussed with Cardiologist. Would be prudent to do a scan to evaluate if clot in lung has gotten better. I am going to order a perfusion scan to evaluate this. Please let him know! Thanks!

## 2021-07-18 ENCOUNTER — Telehealth: Payer: Self-pay | Admitting: Pulmonary Disease

## 2021-07-19 ENCOUNTER — Encounter: Payer: Self-pay | Admitting: Oncology

## 2021-07-22 ENCOUNTER — Telehealth: Payer: Self-pay | Admitting: Adult Health

## 2021-07-22 NOTE — Telephone Encounter (Signed)
Talk to patient and reviewed his results which are concerning for antiphospholipid syndrome.  I reviewed that he would need repeat lab to confirm this.  He is concerned about being able to have knee surgery.  I reviewed with him that Dr. Alen Blew and I had discussed this previously and he is cleared to undergo the surgery.  If there is any concern about how to titrate his Xarelto around the time of his surgery we are happy to help if needed.  Wilber Bihari, NP 07/22/21 4:00 PM Medical Oncology and Hematology [facility] [place], [place] [address] Tel. [phone]    Fax. [phone]

## 2021-07-23 ENCOUNTER — Ambulatory Visit (HOSPITAL_COMMUNITY)
Admission: RE | Admit: 2021-07-23 | Discharge: 2021-07-23 | Disposition: A | Discharge status: Home / Self Care | Payer: 59 | Source: Ambulatory Visit | Attending: Pulmonary Disease | Admitting: Pulmonary Disease

## 2021-07-23 ENCOUNTER — Other Ambulatory Visit: Payer: Self-pay

## 2021-07-23 DIAGNOSIS — I2609 Other pulmonary embolism with acute cor pulmonale: Secondary | ICD-10-CM

## 2021-07-23 MED ORDER — TECHNETIUM TO 99M ALBUMIN AGGREGATED: 4.3400 | Freq: Once | INTRAVENOUS | Status: DC | PRN

## 2021-07-23 NOTE — Telephone Encounter (Signed)
Closing encounter

## 2021-07-24 ENCOUNTER — Ambulatory Visit (HOSPITAL_COMMUNITY)
Admission: RE | Admit: 2021-07-24 | Discharge: 2021-07-24 | Disposition: A | Discharge status: Home / Self Care | Payer: 59 | Source: Ambulatory Visit | Attending: Vascular Surgery | Admitting: Vascular Surgery

## 2021-07-24 ENCOUNTER — Ambulatory Visit (INDEPENDENT_AMBULATORY_CARE_PROVIDER_SITE_OTHER): Payer: 59 | Admitting: Vascular Surgery

## 2021-07-24 ENCOUNTER — Encounter: Payer: Self-pay | Admitting: Vascular Surgery

## 2021-07-24 ENCOUNTER — Other Ambulatory Visit: Payer: Self-pay

## 2021-07-24 VITALS — BP 114/80 | HR 78 | Temp 97.8°F | Resp 20 | Ht 73.0 in | Wt 256.0 lb

## 2021-07-24 DIAGNOSIS — I824Y2 Acute embolism and thrombosis of unspecified deep veins of left proximal lower extremity: Secondary | ICD-10-CM

## 2021-07-24 DIAGNOSIS — I871 Compression of vein: Secondary | ICD-10-CM

## 2021-07-24 NOTE — Progress Notes (Signed)
Patient ID: Dylan [name], male   DOB: July 21, 1966, 55 y.o.   MRN: [id number]  Reason for Consult: Follow-up   Referred by [name], PA-C  Subjective:     HPI:  Dylan [name] is a 55 y.o. male history of extensive left lower extremity DVT and PE.  He underwent left lower extremity venography which demonstrated May Thurner syndrome unfortunately he desatted during the thrombectomy portion of the case and was terminated.  He was subsequently found to have significant PE.  He no longer has any shortness of breath.  His swelling is occasionally bothersome in his left lower extremity but he wears compression socks and does not have any swelling today.  He says his chief complaint is left knee pain and he is in need of a knee replacement.  He continues on Xarelto.  He has been evaluated by hematology they are continuing him on Xarelto for the interim and he will follow-up with them in the future for further evaluation.  Plan for knee replacement in March of this year with Dr. Lyla Glassing.  Past Medical History:  Diagnosis Date   Arthritis    CAD (coronary artery disease) 07/2010; 07/2012; 08/2014   cardiologist--- dr croitoru;  1) 11-28-'11: NSTEMI - 100% RCA - PCI (3 Taxus ION DES 3.0 x 28, 3.0 x 24 & 3.5 x 12 distal-prox); b) 12/ 2013 ISR of RCA stent - -AnngioSculpt PTCA; c) NSTEMI 1/'16: mild ISR with Thrombosis of RCA Stents (in setting of Mesenteric Vein Thrombosis) - Cutting Balloon PTCA; c. Re-look Cath 07/2015 & 07/2016: ~15% ISR in RCA,20% p-mLAD & o-pCx.   Chronic gout    01-07-2021  last flare-up approxl 12-17-2020 bilteral great toes   DM type 2 (diabetes mellitus, type 2) ([place])    followed by pcp   (01-07-2021 per pt does not check blood sugar at home)   Edema of both lower extremities    01-07-2021  pt states wears compression hose   Essential hypertension    followed by pcp and cardiology   History of concussion    01-07-2021  per pt x4 as child, last one age 34  without residual   History of CVA with residual deficit 01/60/1093   embolic left MCA infarct post PCI stenting approx one hour  (01-07-2021 per pt has residual minimal intermittant aphasia )   History of non-ST elevation myocardial infarction (NSTEMI) 07-01-2010  and 01/ 2016   History of prostatitis    History of resection of small bowel 08/28/2014   small bowel perforation,  s/p resction    History of retinal tear 04/2013   right micro-rupture , no occlusion, retinal artery branch w/ vision loss,  per imaging no infarct/ occlusion   History of thrombosis 08/2014   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, provoked by testosterone injection,  08-09-2014 s/p thrombolytic lysis VIR    Hyperlipidemia with target LDL less than 70 09/01/2014   Nocturia    PAC (premature atrial contraction)    pt hx palpitations, event monitor 08-18-2017  showed NS w/ occasional PACs, rare PVCs   Prostate cancer [facility]) urologist-- dr herrick/  oncologist-- dr Tammi Klippel   dx 09-04-2020,  Stage T1c, Gleason 4+3    S/P drug eluting coronary stent placement 07/01/2010   DES x3  to prox and distal RCA   S/P percutaneous transluminal coronary angioplasty 12/ 2013 and 01/ 2016   for ISR  RCA   Family History  Problem Relation Age  of Onset   Atrial fibrillation Mother    Mitral valve prolapse Mother    Coronary artery disease Father        CABG, aortic aneursym,   Heart attack Father    Heart attack Paternal Uncle    Heart attack Paternal Grandfather    Clotting disorder Neg Hx    Past Surgical History:  Procedure Laterality Date   BOWEL RESECTION N/A 08/28/2014   Procedure: SMALL BOWEL RESECTION;  Surgeon: [name], MD;  Location: [place];  Service: General;  Laterality: N/A;   CARDIAC CATHETERIZATION N/A 07/20/2015   Procedure: Left Heart Cath and Coronary Angiography;  Surgeon: [name], MD;  Location: [place] [facility];  Service: Cardiovascular;  15% ISR in the RCA stent  segment, 20% proximal-mid LAD as well as ostial and proximal circumflex. Normal LV function.   CARDIAC CATHETERIZATION N/A 07/23/2016   Procedure: Left Heart Cath and Coronary Angiography;  Surgeon: [name], MD;  Location: [place] [facility];  Service: Cardiovascular: E 55-65%. ~15% diffuse RCA ISR, ~20% diffuse pLAD & pCx   CORONARY ANGIOPLASTY  07/06/2012; 08/15/2014   a) 12/'13: PTCA of 80% ISR mRCA - AngioSculpt; b) PTCA of  ISR/thrombosis   CORONARY ANGIOPLASTY WITH STENT PLACEMENT  07-01-2010  dr harding @[facility]    inferior wall MI - 3 Taxus Ion DES (3.0x37mm, 3.0x76mm, 3.5x32mm) to prox and mid RCA   CYSTOSCOPY N/A 01/10/2021   Procedure: CYSTOSCOPY FLEXIBLE;  Surgeon: Ardis Hughs, MD;  Location: [facility];  Service: Urology;  Laterality: N/A;   INCISIONAL HERNIA REPAIR N/A 10/15/2017   Procedure: HERNIA REPAIR INCISIONAL;  Surgeon: [name], MD;  Location: [facility];  Service: General;  Laterality: N/A;   INGUINAL HERNIA REPAIR Bilateral 05/2010   INSERTION OF MESH N/A 10/15/2017   Procedure: INSERTION OF MESH;  Surgeon: [name], MD;  Location: [facility];  Service: General;  Laterality: N/A;   INTRAVASCULAR ULTRASOUND/IVUS Left 04/29/2021   Procedure: Intravascular Ultrasound/IVUS;  Surgeon: Waynetta Sandy, MD;  Location: [place] [facility];  Service: Cardiovascular;  Laterality: Left;   KNEE ARTHROSCOPY W/ MENISCAL REPAIR Left 06/2011   LAPAROSCOPIC APPENDECTOMY N/A 08/28/2014   Procedure: APPENDECTOMY LAPAROSCOPIC;  Surgeon: [name], MD;  Location: [place];  Service: General;  Laterality: N/A;   LAPAROSCOPY  08/28/2014   Procedure: LAPAROSCOPY DIAGNOSTIC;  Surgeon: [name], MD;  Location: [place];  Service: General;;   LEFT HEART CATHETERIZATION WITH CORONARY ANGIOGRAM N/A 06/11/2012   Procedure: LEFT HEART CATHETERIZATION WITH CORONARY ANGIOGRAM;  Surgeon: [name], MD;  Location: [place] [facility];  Service: Cardiovascular;   Laterality: N/A;   LEFT HEART CATHETERIZATION WITH CORONARY ANGIOGRAM N/A 04/18/2013   Procedure: LEFT HEART CATHETERIZATION WITH CORONARY ANGIOGRAM;  Surgeon: [name], MD;  Location: [facility];  Service: Cardiovascular;  Laterality: N/A;   LEFT HEART CATHETERIZATION WITH CORONARY ANGIOGRAM N/A 08/15/2014   Procedure: LEFT HEART CATHETERIZATION WITH CORONARY ANGIOGRAM;  Surgeon: [name], MD;  Location: [facility] C[facility] LAB;  Service: Cardiovascular;  Laterality: N/A;   LOWER EXTREMITY VENOGRAPHY Left 04/29/2021   Procedure: LOWER EXTREMITY VENOGRAPHY;  Surgeon: Waynetta Sandy, MD;  Location: [place] [facility];  Service: Cardiovascular;  Laterality: Left;   PERCUTANEOUS CORONARY STENT INTERVENTION (PCI-S) N/A 07/06/2012   Procedure: PERCUTANEOUS CORONARY STENT INTERVENTION (PCI-S);  Surgeon: [name], MD;  Location: [facility] LAB;  Service: Cardiovascular;  Laterality: N/A;   PERIPHERAL VASCULAR THROMBECTOMY Left 04/29/2021  Procedure: PERIPHERAL VASCULAR THROMBECTOMY;  Surgeon: Waynetta Sandy, MD;  Location: [place] [facility];  Service: Cardiovascular;  Laterality: Left;   RADIOACTIVE SEED IMPLANT N/A 01/10/2021   Procedure: RADIOACTIVE SEED IMPLANT/BRACHYTHERAPY IMPLANT;  Surgeon: Ardis Hughs, MD;  Location: [facility];  Service: Urology;  Laterality: N/A;   SEPTOPLASTY  11-01-2001 @ [place]   SPACE OAR INSTILLATION N/A 01/10/2021   Procedure: SPACE OAR INSTILLATION;  Surgeon: Ardis Hughs, MD;  Location: [facility];  Service: Urology;  Laterality: N/A;   TONSILLECTOMY AND ADENOIDECTOMY  age 7   TRANSTHORACIC ECHOCARDIOGRAM  08/15/2014   Moderate concentric LVH. EF 60-65% with no regional WMA. Gr 1 DD, otherwise normal    Short Social History:  Social History   Tobacco Use   Smoking status: Former    Packs/day: 1.00    Years: 15.00    Pack years: 15.00    Types: Cigarettes    Quit date: 06/30/2010    Years since  quitting: 11.0   Smokeless tobacco: Current    Types: Snuff  Substance Use Topics   Alcohol use: Yes    Alcohol/week: 7.0 - 14.0 standard drinks    Types: 7 - 14 Shots of liquor per week    Comment: 1-2 bourbon on the rocks per day    Allergies  Allergen Reactions   Crestor [Rosuvastatin] Other (See Comments)    Severe muscle pain and nausea   Zofran [Ondansetron Hcl] Nausea And Vomiting   Ace Inhibitors Cough   Lipitor [Atorvastatin] Other (See Comments)    myalgia   Codeine Itching    Current Outpatient Medications  Medication Sig Dispense Refill   acetaminophen (TYLENOL) 325 MG tablet Take 650 mg by mouth daily as needed (pain).     aspirin EC 81 MG tablet Take 81 mg by mouth every morning. Swallow whole.     carvedilol (COREG) 12.5 MG tablet TAKE 1 TABLET(12.5 MG) BY MOUTH TWICE DAILY 180 tablet 3   Cholecalciferol (VITAMIN D3 PO) Take 1 capsule by mouth every morning.     Empagliflozin-metFORMIN HCl ER (SYNJARDY XR) 25-1000 MG TB24 Take 1 tablet by mouth every morning.     Evolocumab (REPATHA SURECLICK) 505 MG/ML SOAJ Inject 140 mg into the skin every 14 (fourteen) days. 2 mL 11   furosemide (LASIX) 40 MG tablet TAKE 2 TABLETS BY MOUTH EVERY MORNING AND 1 TABLET IN THE EVENING (Patient taking differently: Take 40-80 mg by mouth See admin instructions. Take one tablet (40 mg) by mouth every morning and two tablets (80 mg) at night) 270 tablet 3   nitroGLYCERIN (NITROSTAT) 0.4 MG SL tablet Place 1 tablet (0.4 mg total) under the tongue every 5 (five) minutes x 3 doses as needed for chest pain. 25 tablet 2   olmesartan (BENICAR) 40 MG tablet Take 1 tablet (40 mg total) by mouth daily. 90 tablet 0   Potassium 99 MG TABS Take 1 tablet by mouth daily.     Propylene Glycol (SYSTANE COMPLETE) 0.6 % SOLN Apply 1 drop to eye daily.     rivaroxaban (XARELTO) 20 MG TABS tablet Take 1 tablet (20 mg total) by mouth daily with supper. 30 tablet 4   No current facility-administered  medications for this visit.   Facility-Administered Medications Ordered in Other Visits  Medication Dose Route Frequency Provider Last Rate Last Admin   technetium albumin aggregated (MAA) injection solution 3.97 millicurie  6.73 millicurie Intravenous Once PRN Macy Mis, MD  Review of Systems  Constitutional:  Constitutional negative. HENT: HENT negative.  Eyes: Eyes negative.  Respiratory: Respiratory negative.  Cardiovascular: Positive for leg swelling.  GI: Gastrointestinal negative.  Musculoskeletal: Musculoskeletal negative.  Skin: Skin negative.  Neurological: Neurological negative. Hematologic: Hematologic/lymphatic negative.  Psychiatric: Psychiatric negative.       Objective:  Objective   Vitals:   07/24/21 1422  BP: 114/80  Pulse: 78  Resp: 20  Temp: 97.8 F (36.6 C)  SpO2: 95%  Weight: 256 lb (116.1 kg)  Height: 6\' 1"  (1.854 m)   Body mass index is 33.78 kg/m.  Physical Exam HENT:     Head: Normocephalic.     Nose:     Comments: Wearing a mask Eyes:     Pupils: Pupils are equal, round, and reactive to light.  Cardiovascular:     Rate and Rhythm: Normal rate.     Pulses: Normal pulses.  Pulmonary:     Effort: Pulmonary effort is normal.  Abdominal:     General: Abdomen is flat.     Palpations: Abdomen is soft.  Musculoskeletal:     Cervical back: Normal range of motion.     Comments: No notable edema today  Skin:    General: Skin is warm and dry.     Capillary Refill: Capillary refill takes less than 2 seconds.  Neurological:     General: No focal deficit present.     Mental Status: He is alert. Mental status is at baseline.  Psychiatric:        Mood and Affect: Mood normal.    Data:  LEFT      Compressibility Phasicity Spontaneity Properties Thrombus  Aging   +---------+---------------+---------+-----------+----------+--------------+    CFV       Full                                                                 +---------+---------------+---------+-----------+----------+--------------+    SFJ       Full                                                                +---------+---------------+---------+-----------+----------+--------------+    FV Prox   Full                                                                +---------+---------------+---------+-----------+----------+--------------+    FV Mid    Partial                                          Chronic           +---------+---------------+---------+-----------+----------+--------------+    FV Distal Partial  Chronic           +---------+---------------+---------+-----------+----------+--------------+    POP       Partial                                          Chronic           +---------+---------------+---------+-----------+----------+--------------+    PTV       Full                                                                +---------+---------------+---------+-----------+----------+--------------+    Gastroc   Partial                                          Chronic           +---------+---------------+---------+-----------+----------+--------------+    GSV       Full                                                                +---------+---------------+---------+-----------+----------+--------------+    SSV       None                                             Chronic           +---------+---------------+---------+-----------+----------+--------------+      Summary:   LEFT:  - Findings consistent with chronic deep vein thrombosis involving the mid  and distal femoral vein, popliteal vein, and gastrocnemius veins.  - Chronic thrombus involving the small saphenous vein.      Assessment/Plan:     55 year old male with history as noted above.  Now planning for left knee replacement.  He will need to be off his anticoagulation for that and will need a filter.  We  will also need to be evaluated for May Thurner syndrome with venogram.  We will plan to do this in January with venography from a left common femoral approach given that the femoral vein is patent although with chronic thrombus only.  At this time we can stent the May Thurner's if it is evident and we can place an IVC filter.  Plan will be to remove the IVC filter after he recovers from knee replacement surgery.  We will hold Xarelto for 1 day prior to this procedure and restart the next day.  I discussed the risk benefits and alternatives and he demonstrates good understanding.     Waynetta Sandy MD [facility]

## 2021-07-24 NOTE — H&P (View-Only) (Signed)
Patient ID: Dylan [name], male   DOB: 1965-11-27, 55 y.o.   MRN: [id number]  Reason for Consult: Follow-up   Referred by [name], PA-C  Subjective:     HPI:  Dylan [name] is a 55 y.o. male history of extensive left lower extremity DVT and PE.  He underwent left lower extremity venography which demonstrated May Thurner syndrome unfortunately he desatted during the thrombectomy portion of the case and was terminated.  He was subsequently found to have significant PE.  He no longer has any shortness of breath.  His swelling is occasionally bothersome in his left lower extremity but he wears compression socks and does not have any swelling today.  He says his chief complaint is left knee pain and he is in need of a knee replacement.  He continues on Xarelto.  He has been evaluated by hematology they are continuing him on Xarelto for the interim and he will follow-up with them in the future for further evaluation.  Plan for knee replacement in March of this year with Dr. Lyla Glassing.  Past Medical History:  Diagnosis Date   Arthritis    CAD (coronary artery disease) 07/2010; 07/2012; 08/2014   cardiologist--- dr croitoru;  1) 11-28-'11: NSTEMI - 100% RCA - PCI (3 Taxus ION DES 3.0 x 28, 3.0 x 24 & 3.5 x 12 distal-prox); b) 12/ 2013 ISR of RCA stent - -AnngioSculpt PTCA; c) NSTEMI 1/'16: mild ISR with Thrombosis of RCA Stents (in setting of Mesenteric Vein Thrombosis) - Cutting Balloon PTCA; c. Re-look Cath 07/2015 & 07/2016: ~15% ISR in RCA,20% p-mLAD & o-pCx.   Chronic gout    01-07-2021  last flare-up approxl 12-17-2020 bilteral great toes   DM type 2 (diabetes mellitus, type 2) ([place])    followed by pcp   (01-07-2021 per pt does not check blood sugar at home)   Edema of both lower extremities    01-07-2021  pt states wears compression hose   Essential hypertension    followed by pcp and cardiology   History of concussion    01-07-2021  per pt x4 as child, last one age 79  without residual   History of CVA with residual deficit 44/08/270   embolic left MCA infarct post PCI stenting approx one hour  (01-07-2021 per pt has residual minimal intermittant aphasia )   History of non-ST elevation myocardial infarction (NSTEMI) 07-01-2010  and 01/ 2016   History of prostatitis    History of resection of small bowel 08/28/2014   small bowel perforation,  s/p resction    History of retinal tear 04/2013   right micro-rupture , no occlusion, retinal artery branch w/ vision loss,  per imaging no infarct/ occlusion   History of thrombosis 08/2014   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, provoked by testosterone injection,  08-09-2014 s/p thrombolytic lysis VIR    Hyperlipidemia with target LDL less than 70 09/01/2014   Nocturia    PAC (premature atrial contraction)    pt hx palpitations, event monitor 08-18-2017  showed NS w/ occasional PACs, rare PVCs   Prostate cancer [facility]) urologist-- dr herrick/  oncologist-- dr Tammi Klippel   dx 09-04-2020,  Stage T1c, Gleason 4+3    S/P drug eluting coronary stent placement 07/01/2010   DES x3  to prox and distal RCA   S/P percutaneous transluminal coronary angioplasty 12/ 2013 and 01/ 2016   for ISR  RCA   Family History  Problem Relation Age  of Onset   Atrial fibrillation Mother    Mitral valve prolapse Mother    Coronary artery disease Father        CABG, aortic aneursym,   Heart attack Father    Heart attack Paternal Uncle    Heart attack Paternal Grandfather    Clotting disorder Neg Hx    Past Surgical History:  Procedure Laterality Date   BOWEL RESECTION N/A 08/28/2014   Procedure: SMALL BOWEL RESECTION;  Surgeon: [name], MD;  Location: [place];  Service: General;  Laterality: N/A;   CARDIAC CATHETERIZATION N/A 07/20/2015   Procedure: Left Heart Cath and Coronary Angiography;  Surgeon: [name], MD;  Location: [place] [facility];  Service: Cardiovascular;  15% ISR in the RCA stent  segment, 20% proximal-mid LAD as well as ostial and proximal circumflex. Normal LV function.   CARDIAC CATHETERIZATION N/A 07/23/2016   Procedure: Left Heart Cath and Coronary Angiography;  Surgeon: [name], MD;  Location: [place] [facility];  Service: Cardiovascular: E 55-65%. ~15% diffuse RCA ISR, ~20% diffuse pLAD & pCx   CORONARY ANGIOPLASTY  07/06/2012; 08/15/2014   a) 12/'13: PTCA of 80% ISR mRCA - AngioSculpt; b) PTCA of  ISR/thrombosis   CORONARY ANGIOPLASTY WITH STENT PLACEMENT  07-01-2010  dr harding @[facility]    inferior wall MI - 3 Taxus Ion DES (3.0x17mm, 3.0x67mm, 3.5x64mm) to prox and mid RCA   CYSTOSCOPY N/A 01/10/2021   Procedure: CYSTOSCOPY FLEXIBLE;  Surgeon: Ardis Hughs, MD;  Location: [facility];  Service: Urology;  Laterality: N/A;   INCISIONAL HERNIA REPAIR N/A 10/15/2017   Procedure: HERNIA REPAIR INCISIONAL;  Surgeon: [name], MD;  Location: [facility];  Service: General;  Laterality: N/A;   INGUINAL HERNIA REPAIR Bilateral 05/2010   INSERTION OF MESH N/A 10/15/2017   Procedure: INSERTION OF MESH;  Surgeon: [name], MD;  Location: [facility];  Service: General;  Laterality: N/A;   INTRAVASCULAR ULTRASOUND/IVUS Left 04/29/2021   Procedure: Intravascular Ultrasound/IVUS;  Surgeon: Waynetta Sandy, MD;  Location: [place] [facility];  Service: Cardiovascular;  Laterality: Left;   KNEE ARTHROSCOPY W/ MENISCAL REPAIR Left 06/2011   LAPAROSCOPIC APPENDECTOMY N/A 08/28/2014   Procedure: APPENDECTOMY LAPAROSCOPIC;  Surgeon: [name], MD;  Location: [place];  Service: General;  Laterality: N/A;   LAPAROSCOPY  08/28/2014   Procedure: LAPAROSCOPY DIAGNOSTIC;  Surgeon: [name], MD;  Location: [place];  Service: General;;   LEFT HEART CATHETERIZATION WITH CORONARY ANGIOGRAM N/A 06/11/2012   Procedure: LEFT HEART CATHETERIZATION WITH CORONARY ANGIOGRAM;  Surgeon: [name], MD;  Location: [place] [facility];  Service: Cardiovascular;   Laterality: N/A;   LEFT HEART CATHETERIZATION WITH CORONARY ANGIOGRAM N/A 04/18/2013   Procedure: LEFT HEART CATHETERIZATION WITH CORONARY ANGIOGRAM;  Surgeon: [name], MD;  Location: [facility] C[facility] LAB;  Service: Cardiovascular;  Laterality: N/A;   LEFT HEART CATHETERIZATION WITH CORONARY ANGIOGRAM N/A 08/15/2014   Procedure: LEFT HEART CATHETERIZATION WITH CORONARY ANGIOGRAM;  Surgeon: [name], MD;  Location: [facility] C[facility] LAB;  Service: Cardiovascular;  Laterality: N/A;   LOWER EXTREMITY VENOGRAPHY Left 04/29/2021   Procedure: LOWER EXTREMITY VENOGRAPHY;  Surgeon: Waynetta Sandy, MD;  Location: [place] [facility];  Service: Cardiovascular;  Laterality: Left;   PERCUTANEOUS CORONARY STENT INTERVENTION (PCI-S) N/A 07/06/2012   Procedure: PERCUTANEOUS CORONARY STENT INTERVENTION (PCI-S);  Surgeon: [name], MD;  Location: [facility] CATH LAB;  Service: Cardiovascular;  Laterality: N/A;   PERIPHERAL VASCULAR THROMBECTOMY Left 04/29/2021  Procedure: PERIPHERAL VASCULAR THROMBECTOMY;  Surgeon: Waynetta Sandy, MD;  Location: [place] [facility];  Service: Cardiovascular;  Laterality: Left;   RADIOACTIVE SEED IMPLANT N/A 01/10/2021   Procedure: RADIOACTIVE SEED IMPLANT/BRACHYTHERAPY IMPLANT;  Surgeon: Ardis Hughs, MD;  Location: [facility];  Service: Urology;  Laterality: N/A;   SEPTOPLASTY  11-01-2001 @ [place]   SPACE OAR INSTILLATION N/A 01/10/2021   Procedure: SPACE OAR INSTILLATION;  Surgeon: Ardis Hughs, MD;  Location: [facility];  Service: Urology;  Laterality: N/A;   TONSILLECTOMY AND ADENOIDECTOMY  age 41   TRANSTHORACIC ECHOCARDIOGRAM  08/15/2014   Moderate concentric LVH. EF 60-65% with no regional WMA. Gr 1 DD, otherwise normal    Short Social History:  Social History   Tobacco Use   Smoking status: Former    Packs/day: 1.00    Years: 15.00    Pack years: 15.00    Types: Cigarettes    Quit date: 06/30/2010    Years since  quitting: 11.0   Smokeless tobacco: Current    Types: Snuff  Substance Use Topics   Alcohol use: Yes    Alcohol/week: 7.0 - 14.0 standard drinks    Types: 7 - 14 Shots of liquor per week    Comment: 1-2 bourbon on the rocks per day    Allergies  Allergen Reactions   Crestor [Rosuvastatin] Other (See Comments)    Severe muscle pain and nausea   Zofran [Ondansetron Hcl] Nausea And Vomiting   Ace Inhibitors Cough   Lipitor [Atorvastatin] Other (See Comments)    myalgia   Codeine Itching    Current Outpatient Medications  Medication Sig Dispense Refill   acetaminophen (TYLENOL) 325 MG tablet Take 650 mg by mouth daily as needed (pain).     aspirin EC 81 MG tablet Take 81 mg by mouth every morning. Swallow whole.     carvedilol (COREG) 12.5 MG tablet TAKE 1 TABLET(12.5 MG) BY MOUTH TWICE DAILY 180 tablet 3   Cholecalciferol (VITAMIN D3 PO) Take 1 capsule by mouth every morning.     Empagliflozin-metFORMIN HCl ER (SYNJARDY XR) 25-1000 MG TB24 Take 1 tablet by mouth every morning.     Evolocumab (REPATHA SURECLICK) 315 MG/ML SOAJ Inject 140 mg into the skin every 14 (fourteen) days. 2 mL 11   furosemide (LASIX) 40 MG tablet TAKE 2 TABLETS BY MOUTH EVERY MORNING AND 1 TABLET IN THE EVENING (Patient taking differently: Take 40-80 mg by mouth See admin instructions. Take one tablet (40 mg) by mouth every morning and two tablets (80 mg) at night) 270 tablet 3   nitroGLYCERIN (NITROSTAT) 0.4 MG SL tablet Place 1 tablet (0.4 mg total) under the tongue every 5 (five) minutes x 3 doses as needed for chest pain. 25 tablet 2   olmesartan (BENICAR) 40 MG tablet Take 1 tablet (40 mg total) by mouth daily. 90 tablet 0   Potassium 99 MG TABS Take 1 tablet by mouth daily.     Propylene Glycol (SYSTANE COMPLETE) 0.6 % SOLN Apply 1 drop to eye daily.     rivaroxaban (XARELTO) 20 MG TABS tablet Take 1 tablet (20 mg total) by mouth daily with supper. 30 tablet 4   No current facility-administered  medications for this visit.   Facility-Administered Medications Ordered in Other Visits  Medication Dose Route Frequency Provider Last Rate Last Admin   technetium albumin aggregated (MAA) injection solution 1.76 millicurie  1.60 millicurie Intravenous Once PRN Macy Mis, MD  Review of Systems  Constitutional:  Constitutional negative. HENT: HENT negative.  Eyes: Eyes negative.  Respiratory: Respiratory negative.  Cardiovascular: Positive for leg swelling.  GI: Gastrointestinal negative.  Musculoskeletal: Musculoskeletal negative.  Skin: Skin negative.  Neurological: Neurological negative. Hematologic: Hematologic/lymphatic negative.  Psychiatric: Psychiatric negative.       Objective:  Objective   Vitals:   07/24/21 1422  BP: 114/80  Pulse: 78  Resp: 20  Temp: 97.8 F (36.6 C)  SpO2: 95%  Weight: 256 lb (116.1 kg)  Height: 6\' 1"  (1.854 m)   Body mass index is 33.78 kg/m.  Physical Exam HENT:     Head: Normocephalic.     Nose:     Comments: Wearing a mask Eyes:     Pupils: Pupils are equal, round, and reactive to light.  Cardiovascular:     Rate and Rhythm: Normal rate.     Pulses: Normal pulses.  Pulmonary:     Effort: Pulmonary effort is normal.  Abdominal:     General: Abdomen is flat.     Palpations: Abdomen is soft.  Musculoskeletal:     Cervical back: Normal range of motion.     Comments: No notable edema today  Skin:    General: Skin is warm and dry.     Capillary Refill: Capillary refill takes less than 2 seconds.  Neurological:     General: No focal deficit present.     Mental Status: He is alert. Mental status is at baseline.  Psychiatric:        Mood and Affect: Mood normal.    Data:  LEFT      Compressibility Phasicity Spontaneity Properties Thrombus  Aging   +---------+---------------+---------+-----------+----------+--------------+    CFV       Full                                                                 +---------+---------------+---------+-----------+----------+--------------+    SFJ       Full                                                                +---------+---------------+---------+-----------+----------+--------------+    FV Prox   Full                                                                +---------+---------------+---------+-----------+----------+--------------+    FV Mid    Partial                                          Chronic           +---------+---------------+---------+-----------+----------+--------------+    FV Distal Partial  Chronic           +---------+---------------+---------+-----------+----------+--------------+    POP       Partial                                          Chronic           +---------+---------------+---------+-----------+----------+--------------+    PTV       Full                                                                +---------+---------------+---------+-----------+----------+--------------+    Gastroc   Partial                                          Chronic           +---------+---------------+---------+-----------+----------+--------------+    GSV       Full                                                                +---------+---------------+---------+-----------+----------+--------------+    SSV       None                                             Chronic           +---------+---------------+---------+-----------+----------+--------------+      Summary:   LEFT:  - Findings consistent with chronic deep vein thrombosis involving the mid  and distal femoral vein, popliteal vein, and gastrocnemius veins.  - Chronic thrombus involving the small saphenous vein.      Assessment/Plan:     55 year old male with history as noted above.  Now planning for left knee replacement.  He will need to be off his anticoagulation for that and will need a filter.  We  will also need to be evaluated for May Thurner syndrome with venogram.  We will plan to do this in January with venography from a left common femoral approach given that the femoral vein is patent although with chronic thrombus only.  At this time we can stent the May Thurner's if it is evident and we can place an IVC filter.  Plan will be to remove the IVC filter after he recovers from knee replacement surgery.  We will hold Xarelto for 1 day prior to this procedure and restart the next day.  I discussed the risk benefits and alternatives and he demonstrates good understanding.     Waynetta Sandy MD [facility]

## 2021-07-31 ENCOUNTER — Telehealth: Payer: Self-pay | Admitting: Medical

## 2021-07-31 NOTE — Telephone Encounter (Signed)
Appeals faxed 07/31/21

## 2021-07-31 NOTE — Telephone Encounter (Signed)
Follow UP;       Mikle Bosworth from [employer] is calling to see if you received a prior authorization for patient's Repatha?

## 2021-08-07 ENCOUNTER — Telehealth: Payer: Self-pay | Admitting: Pulmonary Disease

## 2021-08-07 NOTE — Telephone Encounter (Signed)
Unable to locate surgical clearance form that patient is inquiring.  Advised that our providers now do a risk assessment that is added to their last office visit and that is sent to the surgeon.  Patient states he has to get the clearance by the end of this week to get on the books for a total knee replacement in March, they are booked out that far.  His deductible resets in April and is trying to get this done while his deductible is met.  He also stated that he had a recent scan on 07/23/21 and has not gotten the results, he has only seen what is on my chart.  Dr. Silas Flood please advise regarding surgical clearance and results of recent scan.  Thank you.

## 2021-08-07 NOTE — Telephone Encounter (Signed)
Called and spoke with patient. He is aware of Dr. Kavin Leech response. He stated if we faxed a copy of this phone encounter to [facility] it should get him cleared for surgery. Advised him that I would do so.   Nothing further needed at time of call.

## 2021-08-07 NOTE — Telephone Encounter (Signed)
Results of the lung scan are normal - no evidence of blood clot remaining in lungs. I can place note in chart regarding surgical risk stratification - a bit unusual since he has no lung disease. Please see below:  Pulmonary medicine does not provide preoperative clearance but rather preoperative restratification.  Assuming no recent respiratory illness and stable hemoglobin levels from most recent check 04/2021, and a surgical time of less than 3 hours, he is low or 1.6% of in-hospital respiratory or pulmonary complication via ARISCAT model.  Timing of holding anticoagulation at the discretion of the surgeon.

## 2021-08-12 ENCOUNTER — Telehealth: Payer: Self-pay | Admitting: Radiation Oncology

## 2021-08-12 NOTE — Telephone Encounter (Signed)
Received a preoperative risk evaluation via fax from [facility]. Placed form in [place] in box.

## 2021-08-13 ENCOUNTER — Telehealth: Payer: Self-pay | Admitting: *Deleted

## 2021-08-13 NOTE — Telephone Encounter (Signed)
RETURNED PATIENT'S PHONE CALL, SPOKE WITH PATIENT. ?

## 2021-08-15 ENCOUNTER — Ambulatory Visit: Payer: Self-pay | Admitting: Student

## 2021-08-19 ENCOUNTER — Encounter (HOSPITAL_COMMUNITY)
Admission: RE | Disposition: A | Discharge status: Home / Self Care | Payer: Self-pay | Source: Home / Self Care | Attending: Vascular Surgery

## 2021-08-19 ENCOUNTER — Ambulatory Visit (HOSPITAL_COMMUNITY)
Admission: RE | Admit: 2021-08-19 | Discharge: 2021-08-19 | Disposition: A | Discharge status: Home / Self Care | Payer: 59 | Attending: Vascular Surgery | Admitting: Vascular Surgery

## 2021-08-19 ENCOUNTER — Other Ambulatory Visit: Payer: Self-pay

## 2021-08-19 DIAGNOSIS — I871 Compression of vein: Secondary | ICD-10-CM | POA: Diagnosis not present

## 2021-08-19 DIAGNOSIS — I82512 Chronic embolism and thrombosis of left femoral vein: Secondary | ICD-10-CM | POA: Diagnosis not present

## 2021-08-19 DIAGNOSIS — Z7901 Long term (current) use of anticoagulants: Secondary | ICD-10-CM | POA: Insufficient documentation

## 2021-08-19 DIAGNOSIS — I82422 Acute embolism and thrombosis of left iliac vein: Secondary | ICD-10-CM | POA: Diagnosis not present

## 2021-08-19 HISTORY — PX: PERIPHERAL VASCULAR INTERVENTION: CATH118257

## 2021-08-19 HISTORY — PX: LOWER EXTREMITY VENOGRAPHY: CATH118253

## 2021-08-19 HISTORY — PX: IVC FILTER INSERTION: CATH118245

## 2021-08-19 LAB — POCT I-STAT, CHEM 8
BUN: 19 mg/dL (ref 6–20)
Calcium, Ion: 1.33 mmol/L (ref 1.15–1.40)
Chloride: 100 mmol/L (ref 98–111)
Creatinine, Ser: 1 mg/dL (ref 0.61–1.24)
Glucose, Bld: 126 mg/dL — ABNORMAL HIGH (ref 70–99)
HCT: 45 % (ref 39.0–52.0)
Hemoglobin: 15.3 g/dL (ref 13.0–17.0)
Potassium: 3.7 mmol/L (ref 3.5–5.1)
Sodium: 141 mmol/L (ref 135–145)
TCO2: 31 mmol/L (ref 22–32)

## 2021-08-19 LAB — POCT ACTIVATED CLOTTING TIME
Activated Clotting Time: 239 seconds
Activated Clotting Time: 203 seconds
Activated Clotting Time: 161 seconds

## 2021-08-19 LAB — GLUCOSE, CAPILLARY
Glucose-Capillary: 167 mg/dL — ABNORMAL HIGH (ref 70–99)
Glucose-Capillary: 132 mg/dL — ABNORMAL HIGH (ref 70–99)

## 2021-08-19 SURGERY — LOWER EXTREMITY VENOGRAPHY
Anesthesia: LOCAL

## 2021-08-19 MED ORDER — MIDAZOLAM HCL 2 MG/2ML IJ SOLN
INTRAMUSCULAR | Status: AC
  Filled 2021-08-19: qty 2

## 2021-08-19 MED ORDER — HEPARIN (PORCINE) IN NACL 1000-0.9 UT/500ML-% IV SOLN
INTRAVENOUS | Status: DC | PRN
  Administered 2021-08-19: 500 mL

## 2021-08-19 MED ORDER — KETOROLAC TROMETHAMINE 30 MG/ML IJ SOLN
30.0000 mg | Freq: Once | INTRAMUSCULAR | Status: AC
  Administered 2021-08-19: 30 mg via INTRAVENOUS
  Filled 2021-08-19: qty 1

## 2021-08-19 MED ORDER — LABETALOL HCL 5 MG/ML IV SOLN: 10.0000 mg | INTRAVENOUS | Status: DC | PRN

## 2021-08-19 MED ORDER — HYDRALAZINE HCL 20 MG/ML IJ SOLN: 5.0000 mg | INTRAMUSCULAR | Status: DC | PRN

## 2021-08-19 MED ORDER — SODIUM CHLORIDE 0.9 % IV SOLN: INTRAVENOUS | Status: DC

## 2021-08-19 MED ORDER — MIDAZOLAM HCL 2 MG/2ML IJ SOLN
INTRAMUSCULAR | Status: DC | PRN
  Administered 2021-08-19 (×2): .5 mg via INTRAVENOUS

## 2021-08-19 MED ORDER — FENTANYL CITRATE (PF) 100 MCG/2ML IJ SOLN
INTRAMUSCULAR | Status: DC | PRN
  Administered 2021-08-19: 25 ug via INTRAVENOUS
  Administered 2021-08-19 (×2): 50 ug via INTRAVENOUS

## 2021-08-19 MED ORDER — LIDOCAINE HCL (PF) 1 % IJ SOLN
INTRAMUSCULAR | Status: DC | PRN
  Administered 2021-08-19: 12 mL

## 2021-08-19 MED ORDER — SODIUM CHLORIDE 0.9 % WEIGHT BASED INFUSION: 1.0000 mL/kg/h | INTRAVENOUS | Status: DC

## 2021-08-19 MED ORDER — ACETAMINOPHEN 325 MG PO TABS: 650.0000 mg | ORAL_TABLET | ORAL | Status: DC | PRN

## 2021-08-19 MED ORDER — SODIUM CHLORIDE 0.9 % IV SOLN: 250.0000 mL | INTRAVENOUS | Status: DC | PRN

## 2021-08-19 MED ORDER — SODIUM CHLORIDE 0.9% FLUSH: 3.0000 mL | Freq: Two times a day (BID) | INTRAVENOUS | Status: DC

## 2021-08-19 MED ORDER — LIDOCAINE HCL (PF) 1 % IJ SOLN
INTRAMUSCULAR | Status: AC
  Filled 2021-08-19: qty 30

## 2021-08-19 MED ORDER — IODIXANOL 320 MG/ML IV SOLN
INTRAVENOUS | Status: DC | PRN
  Administered 2021-08-19: 50 mL

## 2021-08-19 MED ORDER — OXYCODONE HCL 5 MG PO TABS: 5.0000 mg | ORAL_TABLET | ORAL | Status: DC | PRN

## 2021-08-19 MED ORDER — HEPARIN SODIUM (PORCINE) 1000 UNIT/ML IJ SOLN
INTRAMUSCULAR | Status: AC
  Filled 2021-08-19: qty 10

## 2021-08-19 MED ORDER — HEPARIN SODIUM (PORCINE) 1000 UNIT/ML IJ SOLN
INTRAMUSCULAR | Status: DC | PRN
  Administered 2021-08-19: 8000 [IU] via INTRAVENOUS

## 2021-08-19 MED ORDER — SODIUM CHLORIDE 0.9% FLUSH: 3.0000 mL | INTRAVENOUS | Status: DC | PRN

## 2021-08-19 MED ORDER — FENTANYL CITRATE (PF) 100 MCG/2ML IJ SOLN
INTRAMUSCULAR | Status: AC
  Filled 2021-08-19: qty 2

## 2021-08-19 MED ORDER — ONDANSETRON HCL 4 MG/2ML IJ SOLN: 4.0000 mg | Freq: Four times a day (QID) | INTRAMUSCULAR | Status: DC | PRN

## 2021-08-19 MED ORDER — MORPHINE SULFATE (PF) 2 MG/ML IV SOLN: 2.0000 mg | INTRAVENOUS | Status: DC | PRN

## 2021-08-19 SURGICAL SUPPLY — 16 items
BALLN ATLAS 14X40X75 (BALLOONS) ×3
BALLOON ATLAS 14X40X75 (BALLOONS) IMPLANT
CATH VISIONS PV .035 IVUS (CATHETERS) ×1 IMPLANT
FILTER VC CELECT-FEMORAL (Filter) ×1 IMPLANT
KIT ENCORE 26 ADVANTAGE (KITS) ×1 IMPLANT
KIT MICROPUNCTURE NIT STIFF (SHEATH) ×1 IMPLANT
PROTECTION STATION PRESSURIZED (MISCELLANEOUS) ×3
SHEATH PINNACLE 8F 10CM (SHEATH) ×1 IMPLANT
SHEATH PINNACLE 9F 10CM (SHEATH) ×1 IMPLANT
STATION PROTECTION PRESSURIZED (MISCELLANEOUS) IMPLANT
STENT VENOUS ABRE 16X60 (Permanent Stent) ×1 IMPLANT
SYR MEDRAD MARK V 150ML (SYRINGE) ×1 IMPLANT
TRAY PV CATH (CUSTOM PROCEDURE TRAY) ×3 IMPLANT
TUBING INJECTOR 48 (MISCELLANEOUS) ×1 IMPLANT
WIRE BENTSON .035X145CM (WIRE) ×1 IMPLANT
WIRE ROSEN-J .035X260CM (WIRE) ×1 IMPLANT

## 2021-08-19 NOTE — Discharge Instructions (Signed)
Femoral Site Care This sheet gives you information about how to care for yourself after your procedure. Your health care provider may also give you more specific instructions. If you have problems or questions, contact your health care provider. What can I expect after the procedure?  After the procedure, it is common to have: Bruising that usually fades within 1-2 weeks. Tenderness at the site. Follow these instructions at home: Wound care Follow instructions from your health care provider about how to take care of your insertion site. Make sure you: Wash your hands with soap and water before you change your bandage (dressing). If soap and water are not available, use hand sanitizer. Remove your dressing as told by your health care provider. In 24 hours Do not take baths, swim, or use a hot tub until your health care provider approves. You may shower 24-48 hours after the procedure or as told by your health care provider. Gently wash the site with plain soap and water. Pat the area dry with a clean towel. Do not rub the site. This may cause bleeding. Do not apply powder or lotion to the site. Keep the site clean and dry. Check your femoral site every day for signs of infection. Check for: Redness, swelling, or pain. Fluid or blood. Warmth. Pus or a bad smell. Activity For the first 2-3 days after your procedure, or as long as directed: Avoid climbing stairs as much as possible. Do not squat. Do not lift anything that is heavier than 10 lb (4.5 kg), or the limit that you are told, until your health care provider says that it is safe. For 2 days Rest as directed. Avoid sitting for a long time without moving. Get up to take short walks every 1-2 hours. Do not drive for 24 hours if you were given a medicine to help you relax (sedative). General instructions Take over-the-counter and prescription medicines only as told by your health care provider. Keep all follow-up visits as told by  your health care provider. This is important. Contact a health care provider if you have: A fever or chills. You have redness, swelling, or pain around your insertion site. Get help right away if: The catheter insertion area swells very fast. You pass out. You suddenly start to sweat or your skin gets clammy. The catheter insertion area is bleeding, and the bleeding does not stop when you hold steady pressure on the area. These symptoms may represent a serious problem that is an emergency. Do not wait to see if the symptoms will go away. Get medical help right away. Call your local emergency services (911 in the [place] Do not drive yourself to the hospital. Summary After the procedure, it is common to have bruising that usually fades within 1-2 weeks. Check your femoral site every day for signs of infection. Do not lift anything that is heavier than 10 lb (4.5 kg), or the limit that you are told, until your health care provider says that it is safe. This information is not intended to replace advice given to you by your health care provider. Make sure you discuss any questions you have with your health care provider. Document Revised: 08/03/2017 Document Reviewed: 08/03/2017 Elsevier Patient Education  2020 [employer].

## 2021-08-19 NOTE — Interval H&P Note (Signed)
History and Physical Interval Note:  08/19/2021 8:58 AM  Dylan [name]  has presented today for surgery, with the diagnosis of MAY THURNERS.  The various methods of treatment have been discussed with the patient and family. After consideration of risks, benefits and other options for treatment, the patient has consented to  Procedure(s): LOWER EXTREMITY VENOGRAPHY (Left) IVC FILTER INSERTION (N/A) as a surgical intervention.  The patient's history has been reviewed, patient examined, no change in status, stable for surgery.  I have reviewed the patient's chart and labs.  Questions were answered to the patient's satisfaction.     Servando Snare

## 2021-08-19 NOTE — Op Note (Signed)
° ° °  Patient name: Dylan [name] MRN: [id number] DOB: [birth year] Sex: male  08/19/2021 Pre-operative Diagnosis: May thurner syndrome, dvt Post-operative diagnosis:  Same Surgeon:  Eda Paschal. Donzetta Matters, MD Procedure Performed: 1.  Ultrasound-guided cannulation left common femoral vein 2.  IVC filter placement with fluoroscopic guidance 3.  Stent of left common iliac vein with 16 x 60 mm Abre 4.  Left external and common iliac veins and IVC venogram 5.  Intravascular ultrasound left external and common iliac veins and IVC 6.  Moderate sedation with fentanyl and Versed for 49 minutes  Indications: 56 year old male with previous history of extensive left lower extremity DVT.  He had undergone mechanical thrombectomy but during his procedure patient required emergent reversal of moderate sedation due to respiratory depression.  He is now indicated for stenting of his left common iliac vein for May Thurner and placement of IVC filter for pending knee replacement.  Findings: The IVC measured approximately 25 mm and a filter was placed.  Unfortunately due to one of the legs likely being in a sidebranch this filter does sit crooked and the IVC.  This will not affect its effectiveness but may make it difficult to remove.  Filter was placed in the infrarenal position confirmed with both IVUS and venography.  By IVUS there were 2 areas of subtotal occlusion of the common iliac vein.  This was at the standard May Thurner and just proximal to the hypogastric.  These areas were primarily stented.  At completion the diameter where previously was occluded was 15 x 17 mm after balloon angioplasty with a 14 mm balloon.   Procedure:  The patient was identified in the holding area and taken to room 8.  The patient was then placed supine on the table and prepped and draped in the usual sterile fashion.  A time out was called.  Ultrasound was used to evaluate the left common femoral vein which was noted to be patent and  compressible.  There is no spasm percent lidocaine cannulated with micropuncture needle followed by wire sheath.  And images saved the permanent record.  Bentson wire was placed followed by filter introducer sheath.  IVC venogram was performed.  Distal sheath was placed in the infrarenal position the filter was placed.  Unfortunately one of the legs appeared in the sidebranch and this did make the filter sit a bit cattywampus in the IVC leaning towards the patient's right renal vein which may make it difficult for removal.  We then exchanged for a short 8 French sheath.  We performed left common external leg venography followed by intravascular ultrasound from the external leg vein through the common iliac vein and the IVC.  With the above findings we administered 8000 units of heparin.  We changed for a 9 French sheath.  We primarily stented the common iliac vein where we had marked the occlusive areas.  This was postdilated with a 14 mm balloon.  Completion venography demonstrated no residual reflux of contrast and no evidence of stenosis and there was no evidence of stenosis by IVUS with a diameter of 15 x 17 mm after 40 mm balloon angioplasty.  Satisfied with this we removed our wire.  Sheath will be pulled in postoperative holding.  He tolerated procedure without any complication.  Contrast: 50 cc   Jacee Enerson C. Donzetta Matters, MD [facility] [place] Office: [phone] Pager: [phone]

## 2021-08-20 ENCOUNTER — Encounter (HOSPITAL_COMMUNITY): Payer: Self-pay | Admitting: Vascular Surgery

## 2021-09-17 ENCOUNTER — Other Ambulatory Visit: Payer: Self-pay

## 2021-09-17 DIAGNOSIS — I871 Compression of vein: Secondary | ICD-10-CM

## 2021-09-20 ENCOUNTER — Other Ambulatory Visit: Payer: Self-pay

## 2021-09-20 MED ORDER — OLMESARTAN MEDOXOMIL 40 MG PO TABS: 40.0000 mg | ORAL_TABLET | Freq: Every day | ORAL | 0 refills | Status: DC

## 2021-09-23 ENCOUNTER — Ambulatory Visit: Payer: Self-pay | Admitting: Orthopedic Surgery

## 2021-09-23 NOTE — H&P (View-Only) (Signed)
TOTAL KNEE ADMISSION H&P  Patient is being admitted for left total knee arthroplasty.  Subjective:  Chief Complaint:left knee pain.  HPI: Dylan [name], 56 y.o. male, has a history of pain and functional disability in the left knee due to arthritis and has failed non-surgical conservative treatments for greater than 12 weeks to includeNSAID's and/or analgesics, corticosteriod injections, viscosupplementation injections, flexibility and strengthening excercises, use of assistive devices, weight reduction as appropriate, and activity modification.  Onset of symptoms was gradual, starting >10 years ago with rapidlly worsening course since that time. The patient noted no past surgery on the left knee(s).  Patient currently rates pain in the left knee(s) at 10 out of 10 with activity. Patient has night pain, worsening of pain with activity and weight bearing, pain that interferes with activities of daily living, pain with passive range of motion, crepitus, and joint swelling.  Patient has evidence of subchondral cysts, subchondral sclerosis, periarticular osteophytes, and joint space narrowing by imaging studies. There is no active infection.  Patient Active Problem List   Diagnosis Date Noted   Acute deep vein thrombosis (DVT) ([place]) 04/29/2021   DVT (deep venous thrombosis) ([place]) 04/28/2021   Malignant neoplasm of prostate ([place]) 10/12/2020   Elevated PSA 07/16/2020   Non-insulin dependent type 2 diabetes mellitus ([place]) 06/07/2017   Coronary artery disease involving native coronary artery of native heart    Chest pain on exertion 06/06/2017   Medication management 56/21/3086   Diastolic dysfunction 57/84/6962   Finding of multiple premature atrial contractions by electrocardiography 10/05/2015   Iron deficiency anemia 09/29/2014   Obesity (BMI 30-39.9) 09/09/2014   Perforation bowel - 2/2 Mesenteric Ischemia from Mesenteric Vein Thrombosis.    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 04/19/2013   Possible Retinal artery branch occlusion of right eye = POST CATHETERIZATION -- by dilated Eye exam - no evidence of embolic occlusoin; possible microperforation. NOT CVA 04/19/2013   Hypertriglyceridemia - TG >500 04/19/2013   Dyslipidemia, statin intol 07/07/2012   Essential hypertension 07/07/2012   Smoker, quit 06/30/10 07/07/2012   CAD S/P percutaneous coronary angioplasty 07/06/2012   H/O: CVA (cerebrovascular accident) 07/06/2012   NSTEMI (non-ST elevated myocardial infarction) - 07/2010, 2013, 08/2014 07/04/2010   Past Medical History:  Diagnosis Date   Arthritis    CAD (coronary artery disease) 07/2010; 07/2012; 08/2014   cardiologist--- dr croitoru;  1) 11-28-'11: NSTEMI - 100% RCA - PCI (3 Taxus ION DES 3.0 x 28, 3.0 x 24 & 3.5 x 12 distal-prox); b) 12/ 2013 ISR of RCA stent - -AnngioSculpt PTCA; c) NSTEMI 1/'16: mild ISR with Thrombosis of RCA Stents (in setting of Mesenteric Vein Thrombosis) - Cutting Balloon PTCA; c. Re-look Cath 07/2015 & 07/2016: ~15% ISR in RCA,20% p-mLAD & o-pCx.   Chronic gout    01-07-2021  last flare-up approxl 12-17-2020 bilteral great toes   DM type 2 (diabetes mellitus, type 2) ([place])    followed by pcp   (01-07-2021 per pt does not check blood sugar at home)   Edema of both lower extremities    01-07-2021  pt states wears compression hose   Essential hypertension  followed by pcp and cardiology   History of concussion    01-07-2021  per pt x4 as child, last one age 39 without residual   History of CVA with residual deficit 94/17/4081   embolic left MCA infarct post PCI stenting approx one  hour  (01-07-2021 per pt has residual minimal intermittant aphasia )   History of non-ST elevation myocardial infarction (NSTEMI) 07-01-2010  and 01/ 2016   History of prostatitis    History of resection of small bowel 08/28/2014   small bowel perforation,  s/p resction    History of retinal tear 04/2013   right micro-rupture , no occlusion, retinal artery branch w/ vision loss,  per imaging no infarct/ occlusion   History of thrombosis 08/2014   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, provoked by testosterone injection,  08-09-2014 s/p thrombolytic lysis VIR    Hyperlipidemia with target LDL less than 70 09/01/2014   Nocturia    PAC (premature atrial contraction)    pt hx palpitations, event monitor 08-18-2017  showed NS w/ occasional PACs, rare PVCs   Prostate cancer [facility]) urologist-- dr herrick/  oncologist-- dr Tammi Klippel   dx 09-04-2020,  Stage T1c, Gleason 4+3    S/P drug eluting coronary stent placement 07/01/2010   DES x3  to prox and distal RCA   S/P percutaneous transluminal coronary angioplasty 12/ 2013 and 01/ 2016   for ISR  RCA    Past Surgical History:  Procedure Laterality Date   BOWEL RESECTION N/A 08/28/2014   Procedure: SMALL BOWEL RESECTION;  Surgeon: [name], MD;  Location: [place];  Service: General;  Laterality: N/A;   CARDIAC CATHETERIZATION N/A 07/20/2015   Procedure: Left Heart Cath and Coronary Angiography;  Surgeon: [name], MD;  Location: [place] [facility];  Service: Cardiovascular;  15% ISR in the RCA stent segment, 20% proximal-mid LAD as well as ostial and proximal circumflex. Normal LV function.   CARDIAC CATHETERIZATION N/A 07/23/2016   Procedure: Left Heart Cath and Coronary Angiography;  Surgeon: [name], MD;  Location: [place] [facility];  Service: Cardiovascular: E 55-65%. ~15% diffuse RCA ISR, ~20% diffuse pLAD & pCx   CORONARY ANGIOPLASTY  07/06/2012; 08/15/2014   a) 12/'13: PTCA of 80% ISR mRCA - AngioSculpt; b)  PTCA of  ISR/thrombosis   CORONARY ANGIOPLASTY WITH STENT PLACEMENT  07-01-2010  dr harding @[facility]    inferior wall MI - 3 Taxus Ion DES (3.0x10mm, 3.0x25mm, 3.5x60mm) to prox and mid RCA   CYSTOSCOPY N/A 01/10/2021   Procedure: CYSTOSCOPY FLEXIBLE;  Surgeon: Ardis Hughs, MD;  Location[facility];  Service: Urology;  Laterality: N/A;   INCISIONAL HERNIA REPAIR N/A 10/15/2017   Procedure: HERNIA REPAIR INCISIONAL;  Surgeon: [name], MD;  Location: [facility];  Service: General;  Laterality: N/A;   INGUINAL HERNIA REPAIR Bilateral 05/2010   INSERTION OF MESH N/A 10/15/2017   Procedure: INSERTION OF MESH;  Surgeon: [name], MD;  Location: [facility];  Service: General;  Laterality: N/A;   INTRAVASCULAR ULTRASOUND/IVUS Left 04/29/2021   Procedure: Intravascular Ultrasound/IVUS;  Surgeon: Waynetta Sandy, MD;  Location: [place] [facility];  Service: Cardiovascular;  Laterality: Left;   IVC FILTER INSERTION N/A 08/19/2021   Procedure: IVC FILTER INSERTION;  Surgeon: Waynetta Sandy, MD;  Location: [place] [facility];  Service: Cardiovascular;  Laterality: N/A;   KNEE ARTHROSCOPY W/ MENISCAL REPAIR Left 06/2011   LAPAROSCOPIC APPENDECTOMY N/A 08/28/2014   Procedure: APPENDECTOMY  LAPAROSCOPIC;  Surgeon: [name], MD;  Location: [place];  Service: General;  Laterality: N/A;   LAPAROSCOPY  08/28/2014   Procedure: LAPAROSCOPY DIAGNOSTIC;  Surgeon: [name], MD;  Location: [place];  Service: General;;   LEFT HEART CATHETERIZATION WITH CORONARY ANGIOGRAM N/A 06/11/2012   Procedure: LEFT HEART CATHETERIZATION WITH CORONARY ANGIOGRAM;  Surgeon: [name], MD;  Location: [place] [facility];  Service: Cardiovascular;  Laterality: N/A;   LEFT HEART CATHETERIZATION WITH CORONARY ANGIOGRAM N/A 04/18/2013   Procedure: LEFT HEART CATHETERIZATION WITH CORONARY ANGIOGRAM;  Surgeon: [name], MD;  Location[facility] C[facility];  Service: Cardiovascular;  Laterality:  N/A;   LEFT HEART CATHETERIZATION WITH CORONARY ANGIOGRAM N/A 08/15/2014   Procedure: LEFT HEART CATHETERIZATION WITH CORONARY ANGIOGRAM;  Surgeon: [name], MD;  Location: [facility] C[facility] LAB;  Service: Cardiovascular;  Laterality: N/A;   LOWER EXTREMITY VENOGRAPHY Left 04/29/2021   Procedure: LOWER EXTREMITY VENOGRAPHY;  Surgeon: Waynetta Sandy, MD;  Location: [place] [facility];  Service: Cardiovascular;  Laterality: Left;   LOWER EXTREMITY VENOGRAPHY Left 08/19/2021   Procedure: LOWER EXTREMITY VENOGRAPHY;  Surgeon: Waynetta Sandy, MD;  Location: [place] [facility];  Service: Cardiovascular;  Laterality: Left;   PERCUTANEOUS CORONARY STENT INTERVENTION (PCI-S) N/A 07/06/2012   Procedure: PERCUTANEOUS CORONARY STENT INTERVENTION (PCI-S);  Surgeon: [name], MD;  Location: [facility] CATH LAB;  Service: Cardiovascular;  Laterality: N/A;   PERIPHERAL VASCULAR INTERVENTION Left 08/19/2021   Procedure: PERIPHERAL VASCULAR INTERVENTION;  Surgeon: Waynetta Sandy, MD;  Location: [place] [facility];  Service: Cardiovascular;  Laterality: Left;  Common Iliac Venous   PERIPHERAL VASCULAR THROMBECTOMY Left 04/29/2021   Procedure: PERIPHERAL VASCULAR THROMBECTOMY;  Surgeon: Waynetta Sandy, MD;  Location: [place] [facility];  Service: Cardiovascular;  Laterality: Left;   RADIOACTIVE SEED IMPLANT N/A 01/10/2021   Procedure: RADIOACTIVE SEED IMPLANT/BRACHYTHERAPY IMPLANT;  Surgeon: Ardis Hughs, MD;  Location: [facility];  Service: Urology;  Laterality: N/A;   SEPTOPLASTY  11-01-2001 @ [facility]   SPACE OAR INSTILLATION N/A 01/10/2021   Procedure: SPACE OAR INSTILLATION;  Surgeon: Ardis Hughs, MD;  Location: [facility];  Service: Urology;  Laterality: N/A;   TONSILLECTOMY AND ADENOIDECTOMY  age 2   TRANSTHORACIC ECHOCARDIOGRAM  08/15/2014   Moderate concentric LVH. EF 60-65% with no regional WMA. Gr 1 DD, otherwise normal    Current  Outpatient Medications  Medication Sig Dispense Refill Last Dose   acetaminophen (TYLENOL) 325 MG tablet Take 650 mg by mouth daily as needed (pain).      aspirin EC 81 MG tablet Take 81 mg by mouth every morning. Swallow whole.      carvedilol (COREG) 12.5 MG tablet TAKE 1 TABLET(12.5 MG) BY MOUTH TWICE DAILY 180 tablet 3    Empagliflozin-metFORMIN HCl ER (SYNJARDY XR) 25-1000 MG TB24 Take 1 tablet by mouth every morning.      Evolocumab (REPATHA SURECLICK) 161 MG/ML SOAJ Inject 140 mg into the skin every 14 (fourteen) days. 2 mL 11    furosemide (LASIX) 40 MG tablet TAKE 2 TABLETS BY MOUTH EVERY MORNING AND 1 TABLET IN THE EVENING (Patient taking differently: Take 40-80 mg by mouth See admin instructions. Take one tablet (40 mg) by mouth every morning and two tablets (80 mg) at night) 270 tablet 3    nitroGLYCERIN (NITROSTAT) 0.4 MG SL tablet Place 1 tablet (0.4 mg total) under the tongue every 5 (five) minutes x 3 doses as needed for chest  pain. 25 tablet 2    olmesartan (BENICAR) 40 MG tablet Take 1 tablet (40 mg total) by mouth daily. Patient must attend appointment for future refills 30 tablet 0    Propylene Glycol (SYSTANE COMPLETE) 0.6 % SOLN Place 1 drop into both eyes 2 (two) times daily as needed (dry/irritated eyes.).      rivaroxaban (XARELTO) 20 MG TABS tablet Take 1 tablet (20 mg total) by mouth daily with supper. 30 tablet 4    No current facility-administered medications for this visit.   Allergies  Allergen Reactions   Crestor [Rosuvastatin] Nausea Only and Other (See Comments)    Severe muscle pain and nausea   Zofran [Ondansetron Hcl] Nausea And Vomiting   Ace Inhibitors Cough   Lipitor [Atorvastatin] Other (See Comments)    myalgia   Codeine Itching    Social History   Tobacco Use   Smoking status: Former    Packs/day: 1.00    Years: 15.00    Pack years: 15.00    Types: Cigarettes    Quit date: 06/30/2010    Years since quitting: 11.2   Smokeless tobacco:  Current    Types: Snuff  Substance Use Topics   Alcohol use: Yes    Alcohol/week: 7.0 - 14.0 standard drinks    Types: 7 - 14 Shots of liquor per week    Comment: 1-2 bourbon on the rocks per day    Family History  Problem Relation Age of Onset   Atrial fibrillation Mother    Mitral valve prolapse Mother    Coronary artery disease Father        CABG, aortic aneursym,   Heart attack Father    Heart attack Paternal Uncle    Heart attack Paternal Grandfather    Clotting disorder Neg Hx      Review of Systems  Musculoskeletal:  Positive for arthralgias, gait problem and joint swelling.  All other systems reviewed and are negative.  Objective:  Physical Exam Constitutional:      Appearance: Normal appearance.  HENT:     Head: Normocephalic and atraumatic.     Nose: Nose normal.     Mouth/Throat:     Mouth: Mucous membranes are moist.     Pharynx: Oropharynx is clear.  Eyes:     Extraocular Movements: Extraocular movements intact.     Conjunctiva/sclera: Conjunctivae normal.     Pupils: Pupils are equal, round, and reactive to light.  Cardiovascular:     Rate and Rhythm: Normal rate and regular rhythm.     Pulses: Normal pulses.  Pulmonary:     Effort: Pulmonary effort is normal. No respiratory distress.  Abdominal:     General: Abdomen is flat. There is no distension.     Palpations: Abdomen is soft.  Genitourinary:    Comments: deferred Musculoskeletal:     Cervical back: Normal range of motion and neck supple.     Left knee: Swelling, effusion, bony tenderness and crepitus present. Decreased range of motion. Abnormal alignment and abnormal patellar mobility.  Skin:    General: Skin is warm and dry.     Capillary Refill: Capillary refill takes less than 2 seconds.  Neurological:     General: No focal deficit present.     Mental Status: He is alert and oriented to person, place, and time.  Psychiatric:        Mood and Affect: Mood normal.        Behavior:  Behavior normal.  Thought Content: Thought content normal.        Judgment: Judgment normal.    Vital signs in last 24 hours: @VSRANGES @  Labs:   Estimated body mass index is 32.98 kg/m as calculated from the following:   Height as of 08/19/21: 6\' 1"  (1.854 m).   Weight as of 08/19/21: 113.4 kg.   Imaging Review Plain radiographs demonstrate severe degenerative joint disease of the left knee(s). The overall alignment issignificant varus. The bone quality appears to be adequate for age and reported activity level.      Assessment/Plan:  End stage arthritis, left knee   The patient history, physical examination, clinical judgment of the provider and imaging studies are consistent with end stage degenerative joint disease of the left knee(s) and total knee arthroplasty is deemed medically necessary. The treatment options including medical management, injection therapy arthroscopy and arthroplasty were discussed at length. The risks and benefits of total knee arthroplasty were presented and reviewed. The risks due to aseptic loosening, infection, stiffness, patella tracking problems, thromboembolic complications and other imponderables were discussed. The patient acknowledged the explanation, agreed to proceed with the plan and consent was signed. Patient is being admitted for inpatient treatment for surgery, pain control, PT, OT, prophylactic antibiotics, VTE prophylaxis, progressive ambulation and ADL's and discharge planning. The patient is planning to be discharged  home with OPPT.  PT eval on 3/24 at [facility] in [place]. Has RW aand [employer]. POV 3/24.     Patient's anticipated LOS is less than 2 midnights, meeting these requirements: - Younger than 73 - Lives within 1 hour of care - Has a competent adult at home to recover with post-op recover - NO history of  - Chronic pain requiring opiods  - Diabetes  - Coronary Artery Disease  - Heart failure  - Heart attack  -  Stroke  - DVT/VTE  - Cardiac arrhythmia  - Respiratory Failure/COPD  - Renal failure  - Anemia  - Advanced Liver disease

## 2021-09-23 NOTE — H&P (Signed)
TOTAL KNEE ADMISSION H&P  Patient is being admitted for left total knee arthroplasty.  Subjective:  Chief Complaint:left knee pain.  HPI: Dylan [name], 56 y.o. male, has a history of pain and functional disability in the left knee due to arthritis and has failed non-surgical conservative treatments for greater than 12 weeks to includeNSAID's and/or analgesics, corticosteriod injections, viscosupplementation injections, flexibility and strengthening excercises, use of assistive devices, weight reduction as appropriate, and activity modification.  Onset of symptoms was gradual, starting >10 years ago with rapidlly worsening course since that time. The patient noted no past surgery on the left knee(s).  Patient currently rates pain in the left knee(s) at 10 out of 10 with activity. Patient has night pain, worsening of pain with activity and weight bearing, pain that interferes with activities of daily living, pain with passive range of motion, crepitus, and joint swelling.  Patient has evidence of subchondral cysts, subchondral sclerosis, periarticular osteophytes, and joint space narrowing by imaging studies. There is no active infection.  Patient Active Problem List   Diagnosis Date Noted   Acute deep vein thrombosis (DVT) ([place]) 04/29/2021   DVT (deep venous thrombosis) ([place]) 04/28/2021   Malignant neoplasm of prostate ([place]) 10/12/2020   Elevated PSA 07/16/2020   Non-insulin dependent type 2 diabetes mellitus ([place]) 06/07/2017   Coronary artery disease involving native coronary artery of native heart    Chest pain on exertion 06/06/2017   Medication management 91/69/4503   Diastolic dysfunction 88/82/8003   Finding of multiple premature atrial contractions by electrocardiography 10/05/2015   Iron deficiency anemia 09/29/2014   Obesity (BMI 30-39.9) 09/09/2014   Perforation bowel - 2/2 Mesenteric Ischemia from Mesenteric Vein Thrombosis.    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 04/19/2013   Possible Retinal artery branch occlusion of right eye = POST CATHETERIZATION -- by dilated Eye exam - no evidence of embolic occlusoin; possible microperforation. NOT CVA 04/19/2013   Hypertriglyceridemia - TG >500 04/19/2013   Dyslipidemia, statin intol 07/07/2012   Essential hypertension 07/07/2012   Smoker, quit 06/30/10 07/07/2012   CAD S/P percutaneous coronary angioplasty 07/06/2012   H/O: CVA (cerebrovascular accident) 07/06/2012   NSTEMI (non-ST elevated myocardial infarction) - 07/2010, 2013, 08/2014 07/04/2010   Past Medical History:  Diagnosis Date   Arthritis    CAD (coronary artery disease) 07/2010; 07/2012; 08/2014   cardiologist--- dr croitoru;  1) 11-28-'11: NSTEMI - 100% RCA - PCI (3 Taxus ION DES 3.0 x 28, 3.0 x 24 & 3.5 x 12 distal-prox); b) 12/ 2013 ISR of RCA stent - -AnngioSculpt PTCA; c) NSTEMI 1/'16: mild ISR with Thrombosis of RCA Stents (in setting of Mesenteric Vein Thrombosis) - Cutting Balloon PTCA; c. Re-look Cath 07/2015 & 07/2016: ~15% ISR in RCA,20% p-mLAD & o-pCx.   Chronic gout    01-07-2021  last flare-up approxl 12-17-2020 bilteral great toes   DM type 2 (diabetes mellitus, type 2) ([place])    followed by pcp   (01-07-2021 per pt does not check blood sugar at home)   Edema of both lower extremities    01-07-2021  pt states wears compression hose   Essential hypertension  followed by pcp and cardiology   History of concussion    01-07-2021  per pt x4 as child, last one age 53 without residual   History of CVA with residual deficit 42/59/5638   embolic left MCA infarct post PCI stenting approx one  hour  (01-07-2021 per pt has residual minimal intermittant aphasia )   History of non-ST elevation myocardial infarction (NSTEMI) 07-01-2010  and 01/ 2016   History of prostatitis    History of resection of small bowel 08/28/2014   small bowel perforation,  s/p resction    History of retinal tear 04/2013   right micro-rupture , no occlusion, retinal artery branch w/ vision loss,  per imaging no infarct/ occlusion   History of thrombosis 08/2014   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, provoked by testosterone injection,  08-09-2014 s/p thrombolytic lysis VIR    Hyperlipidemia with target LDL less than 70 09/01/2014   Nocturia    PAC (premature atrial contraction)    pt hx palpitations, event monitor 08-18-2017  showed NS w/ occasional PACs, rare PVCs   Prostate cancer [facility]) urologist-- dr herrick/  oncologist-- dr Tammi Klippel   dx 09-04-2020,  Stage T1c, Gleason 4+3    S/P drug eluting coronary stent placement 07/01/2010   DES x3  to prox and distal RCA   S/P percutaneous transluminal coronary angioplasty 12/ 2013 and 01/ 2016   for ISR  RCA    Past Surgical History:  Procedure Laterality Date   BOWEL RESECTION N/A 08/28/2014   Procedure: SMALL BOWEL RESECTION;  Surgeon: [name], MD;  Location: [place];  Service: General;  Laterality: N/A;   CARDIAC CATHETERIZATION N/A 07/20/2015   Procedure: Left Heart Cath and Coronary Angiography;  Surgeon: [name], MD;  Location: [place] [facility];  Service: Cardiovascular;  15% ISR in the RCA stent segment, 20% proximal-mid LAD as well as ostial and proximal circumflex. Normal LV function.   CARDIAC CATHETERIZATION N/A 07/23/2016   Procedure: Left Heart Cath and Coronary Angiography;  Surgeon: [name], MD;  Location: [place] [facility];  Service: Cardiovascular: E 55-65%. ~15% diffuse RCA ISR, ~20% diffuse pLAD & pCx   CORONARY ANGIOPLASTY  07/06/2012; 08/15/2014   a) 12/'13: PTCA of 80% ISR mRCA - AngioSculpt; b)  PTCA of  ISR/thrombosis   CORONARY ANGIOPLASTY WITH STENT PLACEMENT  07-01-2010  dr harding @[facility]    inferior wall MI - 3 Taxus Ion DES (3.0x28mm, 3.0x46mm, 3.5x45mm) to prox and mid RCA   CYSTOSCOPY N/A 01/10/2021   Procedure: CYSTOSCOPY FLEXIBLE;  Surgeon: Ardis Hughs, MD;  Location: [facility];  Service: Urology;  Laterality: N/A;   INCISIONAL HERNIA REPAIR N/A 10/15/2017   Procedure: HERNIA REPAIR INCISIONAL;  Surgeon: [name], MD;  Location: [facility];  Service: General;  Laterality: N/A;   INGUINAL HERNIA REPAIR Bilateral 05/2010   INSERTION OF MESH N/A 10/15/2017   Procedure: INSERTION OF MESH;  Surgeon: [name], MD;  Location: [facility];  Service: General;  Laterality: N/A;   INTRAVASCULAR ULTRASOUND/IVUS Left 04/29/2021   Procedure: Intravascular Ultrasound/IVUS;  Surgeon: Waynetta Sandy, MD;  Location: [place] [facility];  Service: Cardiovascular;  Laterality: Left;   IVC FILTER INSERTION N/A 08/19/2021   Procedure: IVC FILTER INSERTION;  Surgeon: Waynetta Sandy, MD;  Location: [place] [facility];  Service: Cardiovascular;  Laterality: N/A;   KNEE ARTHROSCOPY W/ MENISCAL REPAIR Left 06/2011   LAPAROSCOPIC APPENDECTOMY N/A 08/28/2014   Procedure: APPENDECTOMY  LAPAROSCOPIC;  Surgeon: [name], MD;  Location: [place];  Service: General;  Laterality: N/A;   LAPAROSCOPY  08/28/2014   Procedure: LAPAROSCOPY DIAGNOSTIC;  Surgeon: [name], MD;  Location: [place];  Service: General;;   LEFT HEART CATHETERIZATION WITH CORONARY ANGIOGRAM N/A 06/11/2012   Procedure: LEFT HEART CATHETERIZATION WITH CORONARY ANGIOGRAM;  Surgeon: [name], MD;  Location: [facility];  Service: Cardiovascular;  Laterality: N/A;   LEFT HEART CATHETERIZATION WITH CORONARY ANGIOGRAM N/A 04/18/2013   Procedure: LEFT HEART CATHETERIZATION WITH CORONARY ANGIOGRAM;  Surgeon: [name], MD;  Location: [facility];  Service: Cardiovascular;  Laterality:  N/A;   LEFT HEART CATHETERIZATION WITH CORONARY ANGIOGRAM N/A 08/15/2014   Procedure: LEFT HEART CATHETERIZATION WITH CORONARY ANGIOGRAM;  Surgeon: [name], MD;  Location: [facility];  Service: Cardiovascular;  Laterality: N/A;   LOWER EXTREMITY VENOGRAPHY Left 04/29/2021   Procedure: LOWER EXTREMITY VENOGRAPHY;  Surgeon: Waynetta Sandy, MD;  Location: [place] [facility];  Service: Cardiovascular;  Laterality: Left;   LOWER EXTREMITY VENOGRAPHY Left 08/19/2021   Procedure: LOWER EXTREMITY VENOGRAPHY;  Surgeon: Waynetta Sandy, MD;  Location: [place] [facility];  Service: Cardiovascular;  Laterality: Left;   PERCUTANEOUS CORONARY STENT INTERVENTION (PCI-S) N/A 07/06/2012   Procedure: PERCUTANEOUS CORONARY STENT INTERVENTION (PCI-S);  Surgeon: [name], MD;  Location: [facility] C[facility] LAB;  Service: Cardiovascular;  Laterality: N/A;   PERIPHERAL VASCULAR INTERVENTION Left 08/19/2021   Procedure: PERIPHERAL VASCULAR INTERVENTION;  Surgeon: Waynetta Sandy, MD;  Location: [place] [facility];  Service: Cardiovascular;  Laterality: Left;  Common Iliac Venous   PERIPHERAL VASCULAR THROMBECTOMY Left 04/29/2021   Procedure: PERIPHERAL VASCULAR THROMBECTOMY;  Surgeon: Waynetta Sandy, MD;  Location: [place] [facility];  Service: Cardiovascular;  Laterality: Left;   RADIOACTIVE SEED IMPLANT N/A 01/10/2021   Procedure: RADIOACTIVE SEED IMPLANT/BRACHYTHERAPY IMPLANT;  Surgeon: Ardis Hughs, MD;  Location[facility];  Service: Urology;  Laterality: N/A;   SEPTOPLASTY  11-01-2001 @ [facility]   SPACE OAR INSTILLATION N/A 01/10/2021   Procedure: SPACE OAR INSTILLATION;  Surgeon: Ardis Hughs, MD;  Location: [facility];  Service: Urology;  Laterality: N/A;   TONSILLECTOMY AND ADENOIDECTOMY  age 30   TRANSTHORACIC ECHOCARDIOGRAM  08/15/2014   Moderate concentric LVH. EF 60-65% with no regional WMA. Gr 1 DD, otherwise normal    Current  Outpatient Medications  Medication Sig Dispense Refill Last Dose   acetaminophen (TYLENOL) 325 MG tablet Take 650 mg by mouth daily as needed (pain).      aspirin EC 81 MG tablet Take 81 mg by mouth every morning. Swallow whole.      carvedilol (COREG) 12.5 MG tablet TAKE 1 TABLET(12.5 MG) BY MOUTH TWICE DAILY 180 tablet 3    Empagliflozin-metFORMIN HCl ER (SYNJARDY XR) 25-1000 MG TB24 Take 1 tablet by mouth every morning.      Evolocumab (REPATHA SURECLICK) 341 MG/ML SOAJ Inject 140 mg into the skin every 14 (fourteen) days. 2 mL 11    furosemide (LASIX) 40 MG tablet TAKE 2 TABLETS BY MOUTH EVERY MORNING AND 1 TABLET IN THE EVENING (Patient taking differently: Take 40-80 mg by mouth See admin instructions. Take one tablet (40 mg) by mouth every morning and two tablets (80 mg) at night) 270 tablet 3    nitroGLYCERIN (NITROSTAT) 0.4 MG SL tablet Place 1 tablet (0.4 mg total) under the tongue every 5 (five) minutes x 3 doses as needed for chest  pain. 25 tablet 2    olmesartan (BENICAR) 40 MG tablet Take 1 tablet (40 mg total) by mouth daily. Patient must attend appointment for future refills 30 tablet 0    Propylene Glycol (SYSTANE COMPLETE) 0.6 % SOLN Place 1 drop into both eyes 2 (two) times daily as needed (dry/irritated eyes.).      rivaroxaban (XARELTO) 20 MG TABS tablet Take 1 tablet (20 mg total) by mouth daily with supper. 30 tablet 4    No current facility-administered medications for this visit.   Allergies  Allergen Reactions   Crestor [Rosuvastatin] Nausea Only and Other (See Comments)    Severe muscle pain and nausea   Zofran [Ondansetron Hcl] Nausea And Vomiting   Ace Inhibitors Cough   Lipitor [Atorvastatin] Other (See Comments)    myalgia   Codeine Itching    Social History   Tobacco Use   Smoking status: Former    Packs/day: 1.00    Years: 15.00    Pack years: 15.00    Types: Cigarettes    Quit date: 06/30/2010    Years since quitting: 11.2   Smokeless tobacco:  Current    Types: Snuff  Substance Use Topics   Alcohol use: Yes    Alcohol/week: 7.0 - 14.0 standard drinks    Types: 7 - 14 Shots of liquor per week    Comment: 1-2 bourbon on the rocks per day    Family History  Problem Relation Age of Onset   Atrial fibrillation Mother    Mitral valve prolapse Mother    Coronary artery disease Father        CABG, aortic aneursym,   Heart attack Father    Heart attack Paternal Uncle    Heart attack Paternal Grandfather    Clotting disorder Neg Hx      Review of Systems  Musculoskeletal:  Positive for arthralgias, gait problem and joint swelling.  All other systems reviewed and are negative.  Objective:  Physical Exam Constitutional:      Appearance: Normal appearance.  HENT:     Head: Normocephalic and atraumatic.     Nose: Nose normal.     Mouth/Throat:     Mouth: Mucous membranes are moist.     Pharynx: Oropharynx is clear.  Eyes:     Extraocular Movements: Extraocular movements intact.     Conjunctiva/sclera: Conjunctivae normal.     Pupils: Pupils are equal, round, and reactive to light.  Cardiovascular:     Rate and Rhythm: Normal rate and regular rhythm.     Pulses: Normal pulses.  Pulmonary:     Effort: Pulmonary effort is normal. No respiratory distress.  Abdominal:     General: Abdomen is flat. There is no distension.     Palpations: Abdomen is soft.  Genitourinary:    Comments: deferred Musculoskeletal:     Cervical back: Normal range of motion and neck supple.     Left knee: Swelling, effusion, bony tenderness and crepitus present. Decreased range of motion. Abnormal alignment and abnormal patellar mobility.  Skin:    General: Skin is warm and dry.     Capillary Refill: Capillary refill takes less than 2 seconds.  Neurological:     General: No focal deficit present.     Mental Status: He is alert and oriented to person, place, and time.  Psychiatric:        Mood and Affect: Mood normal.        Behavior:  Behavior normal.  Thought Content: Thought content normal.        Judgment: Judgment normal.    Vital signs in last 24 hours: @VSRANGES @  Labs:   Estimated body mass index is 32.98 kg/m as calculated from the following:   Height as of 08/19/21: 6\' 1"  (1.854 m).   Weight as of 08/19/21: 113.4 kg.   Imaging Review Plain radiographs demonstrate severe degenerative joint disease of the left knee(s). The overall alignment issignificant varus. The bone quality appears to be adequate for age and reported activity level.      Assessment/Plan:  End stage arthritis, left knee   The patient history, physical examination, clinical judgment of the provider and imaging studies are consistent with end stage degenerative joint disease of the left knee(s) and total knee arthroplasty is deemed medically necessary. The treatment options including medical management, injection therapy arthroscopy and arthroplasty were discussed at length. The risks and benefits of total knee arthroplasty were presented and reviewed. The risks due to aseptic loosening, infection, stiffness, patella tracking problems, thromboembolic complications and other imponderables were discussed. The patient acknowledged the explanation, agreed to proceed with the plan and consent was signed. Patient is being admitted for inpatient treatment for surgery, pain control, PT, OT, prophylactic antibiotics, VTE prophylaxis, progressive ambulation and ADL's and discharge planning. The patient is planning to be discharged  home with OPPT.  PT eval on 3/24 at [facility] in [place]. Has RW aand [employer]. POV 3/24.     Patient's anticipated LOS is less than 2 midnights, meeting these requirements: - Younger than 43 - Lives within 1 hour of care - Has a competent adult at home to recover with post-op recover - NO history of  - Chronic pain requiring opiods  - Diabetes  - Coronary Artery Disease  - Heart failure  - Heart attack  -  Stroke  - DVT/VTE  - Cardiac arrhythmia  - Respiratory Failure/COPD  - Renal failure  - Anemia  - Advanced Liver disease

## 2021-09-24 ENCOUNTER — Encounter: Payer: Self-pay | Admitting: Vascular Surgery

## 2021-09-25 ENCOUNTER — Other Ambulatory Visit: Payer: Self-pay

## 2021-09-25 ENCOUNTER — Ambulatory Visit (INDEPENDENT_AMBULATORY_CARE_PROVIDER_SITE_OTHER): Payer: 59 | Admitting: Vascular Surgery

## 2021-09-25 ENCOUNTER — Ambulatory Visit (HOSPITAL_COMMUNITY)
Admission: RE | Admit: 2021-09-25 | Discharge: 2021-09-25 | Disposition: A | Discharge status: Home / Self Care | Payer: 59 | Source: Ambulatory Visit | Attending: Vascular Surgery | Admitting: Vascular Surgery

## 2021-09-25 VITALS — BP 123/82 | HR 59 | Temp 97.9°F | Resp 20 | Ht 73.0 in | Wt 259.0 lb

## 2021-09-25 DIAGNOSIS — I871 Compression of vein: Secondary | ICD-10-CM | POA: Insufficient documentation

## 2021-09-25 NOTE — Progress Notes (Signed)
Patient ID: Dylan [name], male   DOB: [birth year], 56 y.o.   MRN: [id number]  Reason for Consult: Routine Post Op   Referred by [name], PA-C  Subjective:     HPI:  Dylan [name] is a 56 y.o. male with history of extensive left lower extremity DVT.  He initially underwent mechanical thrombectomy at the time he was hospitalized with pulmonary embolus.  Unfortunately during his original procedure he had a desaturation event requiring procedure termination.  More recently we stented his left common iliac vein and placed an IVC filter.  He is now planned for left total knee arthroplasty.  He continues on Xarelto with plan discontinuation 72 hours prior to total knee arthroplasty.  Other than left knee pain he is not having any issues.  He remains religious with his lower extremity stockings.  He is followed by hematology.  Past Medical History:  Diagnosis Date   Arthritis    CAD (coronary artery disease) 07/2010; 07/2012; 08/2014   cardiologist--- dr croitoru;  1) 11-28-'11: NSTEMI - 100% RCA - PCI (3 Taxus ION DES 3.0 x 28, 3.0 x 24 & 3.5 x 12 distal-prox); b) 12/ 2013 ISR of RCA stent - -AnngioSculpt PTCA; c) NSTEMI 1/'16: mild ISR with Thrombosis of RCA Stents (in setting of Mesenteric Vein Thrombosis) - Cutting Balloon PTCA; c. Re-look Cath 07/2015 & 07/2016: ~15% ISR in RCA,20% p-mLAD & o-pCx.   Chronic gout    01-07-2021  last flare-up approxl 12-17-2020 bilteral great toes   DM type 2 (diabetes mellitus, type 2) ([place])    followed by pcp   (01-07-2021 per pt does not check blood sugar at home)   Edema of both lower extremities    01-07-2021  pt states wears compression hose   Essential hypertension    followed by pcp and cardiology   History of concussion    01-07-2021  per pt x4 as child, last one age 81 without residual   History of CVA with residual deficit 25/36/6440   embolic left MCA infarct post PCI stenting approx one hour  (01-07-2021 per pt has residual  minimal intermittant aphasia )   History of non-ST elevation myocardial infarction (NSTEMI) 07-01-2010  and 01/ 2016   History of prostatitis    History of resection of small bowel 08/28/2014   small bowel perforation,  s/p resction    History of retinal tear 04/2013   right micro-rupture , no occlusion, retinal artery branch w/ vision loss,  per imaging no infarct/ occlusion   History of thrombosis 08/2014   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, provoked by testosterone injection,  08-09-2014 s/p thrombolytic lysis VIR    Hyperlipidemia with target LDL less than 70 09/01/2014   Nocturia    PAC (premature atrial contraction)    pt hx palpitations, event monitor 08-18-2017  showed NS w/ occasional PACs, rare PVCs   Prostate cancer [facility]) urologist-- dr herrick/  oncologist-- dr Tammi Klippel   dx 09-04-2020,  Stage T1c, Gleason 4+3    S/P drug eluting coronary stent placement 07/01/2010   DES x3  to prox and distal RCA   S/P percutaneous transluminal coronary angioplasty 12/ 2013 and 01/ 2016   for ISR  RCA   Family History  Problem Relation Age of Onset   Atrial fibrillation Mother    Mitral valve prolapse Mother    Coronary artery disease Father        CABG, aortic aneursym,   Heart  attack Father    Heart attack Paternal Uncle    Heart attack Paternal Grandfather    Clotting disorder Neg Hx    Past Surgical History:  Procedure Laterality Date   BOWEL RESECTION N/A 08/28/2014   Procedure: SMALL BOWEL RESECTION;  Surgeon: [name], MD;  Location: [place];  Service: General;  Laterality: N/A;   CARDIAC CATHETERIZATION N/A 07/20/2015   Procedure: Left Heart Cath and Coronary Angiography;  Surgeon: [name], MD;  Location: [place] [facility];  Service: Cardiovascular;  15% ISR in the RCA stent segment, 20% proximal-mid LAD as well as ostial and proximal circumflex. Normal LV function.   CARDIAC CATHETERIZATION N/A 07/23/2016   Procedure: Left Heart Cath and  Coronary Angiography;  Surgeon: [name], MD;  Location: [place] [facility];  Service: Cardiovascular: E 55-65%. ~15% diffuse RCA ISR, ~20% diffuse pLAD & pCx   CORONARY ANGIOPLASTY  07/06/2012; 08/15/2014   a) 12/'13: PTCA of 80% ISR mRCA - AngioSculpt; b) PTCA of  ISR/thrombosis   CORONARY ANGIOPLASTY WITH STENT PLACEMENT  07-01-2010  dr harding @[facility]    inferior wall MI - 3 Taxus Ion DES (3.0x54mm, 3.0x80mm, 3.5x29mm) to prox and mid RCA   CYSTOSCOPY N/A 01/10/2021   Procedure: CYSTOSCOPY FLEXIBLE;  Surgeon: Ardis Hughs, MD;  Location[facility];  Service: Urology;  Laterality: N/A;   INCISIONAL HERNIA REPAIR N/A 10/15/2017   Procedure: HERNIA REPAIR INCISIONAL;  Surgeon: [name], MD;  Location: [facility];  Service: General;  Laterality: N/A;   INGUINAL HERNIA REPAIR Bilateral 05/2010   INSERTION OF MESH N/A 10/15/2017   Procedure: INSERTION OF MESH;  Surgeon: [name], MD;  Location: [facility];  Service: General;  Laterality: N/A;   INTRAVASCULAR ULTRASOUND/IVUS Left 04/29/2021   Procedure: Intravascular Ultrasound/IVUS;  Surgeon: Waynetta Sandy, MD;  Location: [place] [facility];  Service: Cardiovascular;  Laterality: Left;   IVC FILTER INSERTION N/A 08/19/2021   Procedure: IVC FILTER INSERTION;  Surgeon: Waynetta Sandy, MD;  Location: [place] [facility];  Service: Cardiovascular;  Laterality: N/A;   KNEE ARTHROSCOPY W/ MENISCAL REPAIR Left 06/2011   LAPAROSCOPIC APPENDECTOMY N/A 08/28/2014   Procedure: APPENDECTOMY LAPAROSCOPIC;  Surgeon: [name], MD;  Location: [place];  Service: General;  Laterality: N/A;   LAPAROSCOPY  08/28/2014   Procedure: LAPAROSCOPY DIAGNOSTIC;  Surgeon: [name], MD;  Location: [place];  Service: General;;   LEFT HEART CATHETERIZATION WITH CORONARY ANGIOGRAM N/A 06/11/2012   Procedure: LEFT HEART CATHETERIZATION WITH CORONARY ANGIOGRAM;  Surgeon: [name], MD;  Location: [place] [facility];  Service:  Cardiovascular;  Laterality: N/A;   LEFT HEART CATHETERIZATION WITH CORONARY ANGIOGRAM N/A 04/18/2013   Procedure: LEFT HEART CATHETERIZATION WITH CORONARY ANGIOGRAM;  Surgeon: [name], MD;  Location[facility] C[facility] LAB;  Service: Cardiovascular;  Laterality: N/A;   LEFT HEART CATHETERIZATION WITH CORONARY ANGIOGRAM N/A 08/15/2014   Procedure: LEFT HEART CATHETERIZATION WITH CORONARY ANGIOGRAM;  Surgeon: [name], MD;  Location: [facility] C[facility];  Service: Cardiovascular;  Laterality: N/A;   LOWER EXTREMITY VENOGRAPHY Left 04/29/2021   Procedure: LOWER EXTREMITY VENOGRAPHY;  Surgeon: Waynetta Sandy, MD;  Location: [place] [facility];  Service: Cardiovascular;  Laterality: Left;   LOWER EXTREMITY VENOGRAPHY Left 08/19/2021   Procedure: LOWER EXTREMITY VENOGRAPHY;  Surgeon: Waynetta Sandy, MD;  Location: [place] [facility];  Service: Cardiovascular;  Laterality: Left;   PERCUTANEOUS CORONARY STENT INTERVENTION (PCI-S) N/A 07/06/2012   Procedure: PERCUTANEOUS CORONARY STENT  INTERVENTION (PCI-S);  Surgeon: [name], MD;  Location: [facility] CATH LAB;  Service: Cardiovascular;  Laterality: N/A;   PERIPHERAL VASCULAR INTERVENTION Left 08/19/2021   Procedure: PERIPHERAL VASCULAR INTERVENTION;  Surgeon: Waynetta Sandy, MD;  Location: [place] [facility];  Service: Cardiovascular;  Laterality: Left;  Common Iliac Venous   PERIPHERAL VASCULAR THROMBECTOMY Left 04/29/2021   Procedure: PERIPHERAL VASCULAR THROMBECTOMY;  Surgeon: Waynetta Sandy, MD;  Location: [place] [facility];  Service: Cardiovascular;  Laterality: Left;   RADIOACTIVE SEED IMPLANT N/A 01/10/2021   Procedure: RADIOACTIVE SEED IMPLANT/BRACHYTHERAPY IMPLANT;  Surgeon: Ardis Hughs, MD;  Location: [facility];  Service: Urology;  Laterality: N/A;   SEPTOPLASTY  11-01-2001 @ [place]   SPACE OAR INSTILLATION N/A 01/10/2021   Procedure: SPACE OAR INSTILLATION;  Surgeon: Ardis Hughs, MD;   Location[facility];  Service: Urology;  Laterality: N/A;   TONSILLECTOMY AND ADENOIDECTOMY  age 63   TRANSTHORACIC ECHOCARDIOGRAM  08/15/2014   Moderate concentric LVH. EF 60-65% with no regional WMA. Gr 1 DD, otherwise normal    Short Social History:  Social History   Tobacco Use   Smoking status: Former    Packs/day: 1.00    Years: 15.00    Pack years: 15.00    Types: Cigarettes    Quit date: 06/30/2010    Years since quitting: 11.2   Smokeless tobacco: Current    Types: Snuff  Substance Use Topics   Alcohol use: Yes    Alcohol/week: 7.0 - 14.0 standard drinks    Types: 7 - 14 Shots of liquor per week    Comment: 1-2 bourbon on the rocks per day    Allergies  Allergen Reactions   Crestor [Rosuvastatin] Nausea Only and Other (See Comments)    Severe muscle pain and nausea   Zofran [Ondansetron Hcl] Nausea And Vomiting   Ace Inhibitors Cough   Lipitor [Atorvastatin] Other (See Comments)    myalgia   Codeine Itching    Current Outpatient Medications  Medication Sig Dispense Refill   acetaminophen (TYLENOL) 325 MG tablet Take 650 mg by mouth daily as needed (pain).     aspirin EC 81 MG tablet Take 81 mg by mouth every morning. Swallow whole.     carvedilol (COREG) 12.5 MG tablet TAKE 1 TABLET(12.5 MG) BY MOUTH TWICE DAILY 180 tablet 3   Empagliflozin-metFORMIN HCl ER (SYNJARDY XR) 25-1000 MG TB24 Take 1 tablet by mouth every morning.     Evolocumab (REPATHA SURECLICK) 465 MG/ML SOAJ Inject 140 mg into the skin every 14 (fourteen) days. 2 mL 11   furosemide (LASIX) 40 MG tablet TAKE 2 TABLETS BY MOUTH EVERY MORNING AND 1 TABLET IN THE EVENING (Patient taking differently: Take 40-80 mg by mouth See admin instructions. Take one tablet (40 mg) by mouth every morning and two tablets (80 mg) at night) 270 tablet 3   nitroGLYCERIN (NITROSTAT) 0.4 MG SL tablet Place 1 tablet (0.4 mg total) under the tongue every 5 (five) minutes x 3 doses as needed for chest pain.  25 tablet 2   olmesartan (BENICAR) 40 MG tablet Take 1 tablet (40 mg total) by mouth daily. Patient must attend appointment for future refills 30 tablet 0   Propylene Glycol (SYSTANE COMPLETE) 0.6 % SOLN Place 1 drop into both eyes 2 (two) times daily as needed (dry/irritated eyes.).     rivaroxaban (XARELTO) 20 MG TABS tablet Take 1 tablet (20 mg total) by mouth daily with supper.  30 tablet 4   No current facility-administered medications for this visit.    Review of Systems  Constitutional:  Constitutional negative. HENT: HENT negative.  Eyes: Eyes negative.  Cardiovascular: Cardiovascular negative.  GI: Gastrointestinal negative.  Musculoskeletal: Positive for gait problem and joint pain.  Skin: Skin negative.  Hematologic: Hematologic/lymphatic negative.  Psychiatric: Psychiatric negative.       Objective:  Objective   Vitals:   09/25/21 0925  BP: 123/82  Pulse: (!) 59  Resp: 20  Temp: 97.9 F (36.6 C)  SpO2: 95%  Weight: 259 lb (117.5 kg)  Height: 6\' 1"  (1.854 m)   Body mass index is 34.17 kg/m.  Physical Exam HENT:     Head: Normocephalic.     Nose:     Comments: Wearing a mask Eyes:     Pupils: Pupils are equal, round, and reactive to light.  Cardiovascular:     Pulses: Normal pulses.  Pulmonary:     Effort: Pulmonary effort is normal.  Abdominal:     General: Abdomen is flat.  Musculoskeletal:     Cervical back: Normal range of motion.     Comments: Compression stockings in place bilateral lower extremities  Skin:    General: Skin is warm and dry.     Capillary Refill: Capillary refill takes less than 2 seconds.  Neurological:     Mental Status: He is alert.  Psychiatric:        Mood and Affect: Mood normal.        Behavior: Behavior normal.    Data: IVC/Iliac Findings:  +----------+------+--------+--------+      IVC     Patent Thrombus Comments   +----------+------+--------+--------+   IVC Mid    patent                      +----------+------+--------+--------+   IVC Distal patent                     +----------+------+--------+--------+      +-------------------+---------+-----------+---------+-----------+--------+           CIV         RT-Patent RT-Thrombus LT-Patent LT-Thrombus Comments   +-------------------+---------+-----------+---------+-----------+--------+   Common Iliac Prox                          patent                          +-------------------+---------+-----------+---------+-----------+--------+   Common Iliac Mid                           patent                          +-------------------+---------+-----------+---------+-----------+--------+   Common Iliac Distal                        patent                          +-------------------+---------+-----------+---------+-----------+--------+       +-------------------------+---------+-----------+---------+-----------+----  ----+              EIV             RT-Patent RT-Thrombus LT-Patent LT-Thrombus Comments   +-------------------------+---------+-----------+---------+-----------+----  ----+   External Iliac Vein Prox  patent                            +-------------------------+---------+-----------+---------+-----------+----  ----+   External Iliac Vein Mid                          patent                            +-------------------------+---------+-----------+---------+-----------+----  ----+   External Iliac Vein                              patent                             Distal                                                                             +-------------------------+---------+-----------+---------+-----------+----  ----+      Summary:  IVC/Iliac: Visualization of proximal Inferior Vena Cava was limited.  Sub-optimal visualization.  Patent IVC, common iliac vein stent and external iliac vein where  visualized.      Assessment/Plan:    56 year old male with previous  extensive left lower extremity DVT now followed by hematology.  Plan for total knee arthroplasty in 2 weeks.  We will plan to remove the filter in 3 months if he has fully recovered.  At that time may require 12 [place] Gore dry seal sheath given that the filter was somewhat broken at placement due to his time being in the side[place].  We will hold Xarelto 1 day prior to the procedure.  Plan will then be to follow him up for his iliac vein stent at 6 months from now.     Waynetta Sandy MD [facility]

## 2021-09-26 ENCOUNTER — Encounter (HOSPITAL_COMMUNITY): Payer: Self-pay

## 2021-09-26 ENCOUNTER — Other Ambulatory Visit: Payer: Self-pay

## 2021-09-26 ENCOUNTER — Encounter (HOSPITAL_COMMUNITY)
Admission: RE | Admit: 2021-09-26 | Discharge: 2021-09-26 | Disposition: A | Discharge status: Home / Self Care | Payer: 59 | Source: Ambulatory Visit | Attending: Orthopedic Surgery | Admitting: Orthopedic Surgery

## 2021-09-26 DIAGNOSIS — Z01812 Encounter for preprocedural laboratory examination: Secondary | ICD-10-CM | POA: Diagnosis present

## 2021-09-26 DIAGNOSIS — I10 Essential (primary) hypertension: Secondary | ICD-10-CM | POA: Diagnosis not present

## 2021-09-26 DIAGNOSIS — Z01818 Encounter for other preprocedural examination: Secondary | ICD-10-CM

## 2021-09-26 HISTORY — DX: Peripheral vascular disease, unspecified: I73.9

## 2021-09-26 HISTORY — DX: Compression of vein: I87.1

## 2021-09-26 LAB — BASIC METABOLIC PANEL
Anion gap: 9 (ref 5–15)
BUN: 20 mg/dL (ref 6–20)
CO2: 29 mmol/L (ref 22–32)
Calcium: 9.3 mg/dL (ref 8.9–10.3)
Chloride: 101 mmol/L (ref 98–111)
Creatinine, Ser: 0.94 mg/dL (ref 0.61–1.24)
GFR, Estimated: >60 mL/min (ref >60)
Glucose, Bld: 104 mg/dL — ABNORMAL HIGH (ref 70–99)
Potassium: 3.6 mmol/L (ref 3.5–5.1)
Sodium: 139 mmol/L (ref 135–145)

## 2021-09-26 LAB — CBC
HCT: 42 % (ref 39.0–52.0)
Hemoglobin: 14.2 g/dL (ref 13.0–17.0)
MCH: 31.1 pg (ref 26.0–34.0)
MCHC: 33.8 g/dL (ref 30.0–36.0)
MCV: 92.1 fL (ref 80.0–100.0)
Platelets: 233 10*3/uL (ref 150–400)
RBC: 4.56 MIL/uL (ref 4.22–5.81)
RDW: 13 % (ref 11.5–15.5)
WBC: 4.2 10*3/uL (ref 4.0–10.5)
nRBC: 0 % (ref 0.0–0.2)

## 2021-09-26 LAB — SURGICAL PCR SCREEN
MRSA, PCR: NEGATIVE
Staphylococcus aureus: NEGATIVE

## 2021-09-26 NOTE — Patient Instructions (Addendum)
DUE TO COVID-19 ONLY ONE VISITOR IS ALLOWED TO COME WITH YOU AND STAY IN THE WAITING ROOM ONLY DURING PRE OP AND PROCEDURE DAY OF SURGERY.   Up to two visitors ages 16+ are allowed at one time in a patient's room.  The visitors may rotate out with other people throughout the day.  Additionally, up to two children between the ages of 64 and 73 are allowed and do not count toward the number of allowed visitors.  Children within this age range must be accompanied by an adult visitor.  One adult visitor may remain with the patient overnight and must be in the room by 8 PM.  YOU NEED TO HAVE A COVID 19 TEST ON__3/6/23 at 9:15_____ THIS TEST MUST BE DONE BEFORE SURGERY,     COVID [place] COME IN THROUGH MAIN ENTRANCE BE SEATED INT THE LOBBY AREA TO THE RIGHT AS YOU COME IN THE MAIN ENTRANCE DIAL [phone] GIVE THEM YOUR NAME AND LET THEM KNOW YOU ARE HERE FOR COVID TESTING    ONCE YOUR COVID TEST IS COMPLETED,  PLEASE Wear a mask when in [place]           Your procedure is scheduled on: 10/09/21   Report to [facility] Main  Entrance  Report to admitting at  8:45 AM     Call this number if you have problems the morning of surgery [phone]   Remember: Do not eat solid food or drink liquids after Midnight before surgery.  No food after midnight.    You may have clear liquid until 8:30 AM.    At 8:15 AM drink pre surgery drink. G2   CLEAR LIQUID DIET  Foods Allowed                                                                     Exclude  Water, Black Coffee, and tea, regular and decaf             ALL SOLID FOODS! Plain Jell-O in any flavor  (No red)                                     Fruit ices (not with fruit pulp)                                            MILK, SOUPS, Iced Popsicles (No red)                                                                            Apple juices  ORANGE JUICE Sports drinks like Gatorade (No red)                                  Lightly seasoned clear broth or consume(fat free) Sugar/Sweetner   Nothing by mouth after 8:30 AM.    BRUSH YOUR TEETH MORNING OF SURGERY AND RINSE YOUR MOUTH OUT, NO CHEWING GUM CANDY OR MINTS.     Take these medicines the morning of surgery with A SIP OF WATER: Carvedilol  Hold Synjardy the day before surgery                               You may not have any metal on your body including hair pins and piercings .             Do not wear jewelry,  lotions, powders, deodorant or perfume/cologne.              .              Do not shave legs or under arms 48 hours prior to surgery.              Men may shave face and neck.   Do not bring valuables to the hospital. [place].   Contacts, dentures or bridgework may not be worn into surgery.  You may bring a small overnight bag with you               [facility] - Preparing for Surgery Before surgery, you can play an important role.  Because skin is not sterile, your skin needs to be as free of germs as possible.  You can reduce the number of germs on your skin by washing with CHG (chlorahexidine gluconate) soap before surgery.  CHG is an antiseptic cleaner which kills germs and bonds with the skin to continue killing germs even after washing. Please DO NOT use if you have an allergy to CHG or antibacterial soaps.   If your skin becomes reddened/irritated stop using . You may use Dial soap or any antibacterial soap instead.  Women :Do not shave (including legs and underarms) for at least 48 hours prior to the first CHG shower.   Men: You may shave your face/neck.  Please follow these instructions carefully:  1.  Shower with CHG Soap the night before surgery and the  morning of Surgery.  2.  If you choose to wash your hair, wash your hair first as usual with your  normal   shampoo.  3.  After you shampoo, rinse your hair and body thoroughly to remove the  shampoo.                            4.  Use CHG as you would any other liquid soap.  You can apply chg directly  to the skin and wash                       Gently with a scrungie or clean washcloth.  5.  Apply the CHG Soap to your body ONLY FROM THE NECK DOWN.   Do not use on face/ open  Wound or open sores. Avoid contact with eyes, ears mouth and genitals (private parts).                       Wash face,  Genitals (private parts) with your normal soap.             6.  Wash thoroughly, paying special attention to the area where your surgery  will be performed.  7.  Thoroughly rinse your body with warm water from the neck down.  8.  DO NOT shower/wash with your normal soap after using and rinsing off  the CHG Soap.             9.  Pat yourself dry with a clean towel.            10.  Wear clean pajamas.            11.  Place clean sheets on your bed the night of your first shower and do not  sleep with pets.  Day of Surgery : Do not apply any lotions/deodorants the morning of surgery.  Please wear clean clothes to the hospital/surgery center.  FAILURE TO FOLLOW THESE INSTRUCTIONS MAY RESULT IN THE CANCELLATION OF YOUR SURGERY   PATIENT SIGNATURE_________________________________    ________________________________________________________________________

## 2021-09-26 NOTE — Progress Notes (Signed)
COVID test-10/07/21 at 9:15   Bowel prep reminder:NA  PCP - Dr. Heide Scales Cardiologist - Dr. Sallyanne Kuster Vascular- Dr. Gwenlyn Saran  Chest x-ray - 07/23/21-epic EKG - 04/30/21-epic Stress Test - no ECHO - 07/12/21-epic Cardiac Cath - 2011,2013,2016,2017,2022,2023 Pacemaker/ICD device last checked:NA  Sleep Study - yes-negative CPAP - no  Fasting Blood Sugar - 96-120 Checks Blood Sugar _____ times a day. Once a month  Blood Thinner Instructions:Xarelto and ASA/ Dr. Gwenlyn Saran Aspirin Instructions:continue the ASA stop Xarelto 2 days prior to DOS/[place] Last Dose:10/05/21  Anesthesia review: yes  Patient denies shortness of breath, fever, cough and chest pain at PAT appointment Pt has no SOB with activities. An IVP filter was recently placed for DVTs  Patient verbalized understanding of instructions that were given to them at the PAT appointment. Patient was also instructed that they will need to review over the PAT instructions again at home before surgery. yes

## 2021-09-27 LAB — HEMOGLOBIN A1C
Hgb A1c MFr Bld: 6.1 % — ABNORMAL HIGH (ref 4.8–5.6)
Mean Plasma Glucose: 128 mg/dL

## 2021-09-30 NOTE — Progress Notes (Addendum)
Anesthesia Chart Review   Case: [id number] Date/Time: 10/09/21 1115   Procedure: COMPUTER ASSISTED TOTAL KNEE ARTHROPLASTY (Left: Knee) - 150   Anesthesia type: Spinal   Pre-op diagnosis: Left knee osteoarthritis   Location: [facility]OM 07 / [facility]   Surgeons: Rod Can, MD       DISCUSSION:56 y.o. former smoker with h/o HTN, DM II, CAD (DES 2011), prostate cancer, DVT/PE 09/2020, left knee OA scheduled for above procedure 10/09/2021 with Dr. Rod Can.   Last seen by vascular surgeon 09/25/21. IV filter placed 08/19/2021, will be removed three months after knee surgery. Advised to hold Xarelto three days prior to above procedure.  Pt denies cv sx, stable.  Per pulmonology, "Pulmonary medicine does not provide preoperative clearance but rather preoperative restratification.  Assuming no recent respiratory illness and stable hemoglobin levels from most recent check 04/2021, and a surgical time of less than 3 hours, he is low or 1.6% of in-hospital respiratory or pulmonary complication via ARISCAT model.  Timing of holding anticoagulation at the discretion of the surgeon."  Anticipate pt can proceed with planned procedure barring acute status change.   VS: BP (!) 157/94    Pulse 64    Temp 36.8 C (Oral)    Resp 18    Ht 6\' 1"  (1.854 m)    Wt 116.1 kg    SpO2 100%    BMI 33.78 kg/m   PROVIDERS: [name], PA-C is PCP   Waynetta Sandy, MD is Cardiologist  LABS: Labs reviewed: Acceptable for surgery. (all labs ordered are listed, but only abnormal results are displayed)  Labs Reviewed  HEMOGLOBIN A1C - Abnormal; Notable for the following components:      Result Value   Hgb A1c MFr Bld 6.1 (*)    All other components within normal limits  BASIC METABOLIC PANEL - Abnormal; Notable for the following components:   Glucose, Bld 104 (*)    All other components within normal limits  SURGICAL PCR SCREEN  CBC     IMAGES:   EKG: 04/30/2021 Rate 92 bpm  NSR Anterior  infarct, age undetermined  CV: Echo 07/12/2021 1. Left ventricular ejection fraction, by estimation, is 60 to 65%. The  left ventricle has normal function. The left ventricle has no regional  wall motion abnormalities. The left ventricular internal cavity size was  mildly dilated. Left ventricular  diastolic parameters were normal.   2. Right ventricular systolic function is mildly reduced. The right  ventricular size is normal.   3. The mitral valve is normal in structure.   4. Aortic valve regurgitation is not visualized. Aortic valve sclerosis  is present, with no evidence of aortic valve stenosis.   5. Pulmonic valve regurgitation is moderate.   6. The inferior vena cava is normal in size with greater than 50%  respiratory variability, suggesting right atrial pressure of 3 mmHg.  Past Medical History:  Diagnosis Date   Arthritis    CAD (coronary artery disease) 07/2010; 07/2012; 08/2014   cardiologist--- dr croitoru;  1) 11-28-'11: NSTEMI - 100% RCA - PCI (3 Taxus ION DES 3.0 x 28, 3.0 x 24 & 3.5 x 12 distal-prox); b) 12/ 2013 ISR of RCA stent - -AnngioSculpt PTCA; c) NSTEMI 1/'16: mild ISR with Thrombosis of RCA Stents (in setting of Mesenteric Vein Thrombosis) - Cutting Balloon PTCA; c. Re-look Cath 07/2015 & 07/2016: ~15% ISR in RCA,20% p-mLAD & o-pCx.   Chronic gout    01-07-2021  last flare-up approxl  12-17-2020 bilteral great toes   DM type 2 (diabetes mellitus, type 2) ([place])    followed by pcp   (01-07-2021 per pt does not check blood sugar at home)   Edema of both lower extremities    01-07-2021  pt states wears compression hose   Essential hypertension    followed by pcp and cardiology   History of concussion    01-07-2021  per pt x4 as child, last one age 21 without residual   History of CVA with residual deficit 62/95/2841   embolic left MCA infarct post PCI stenting approx one hour  (01-07-2021 per pt has residual minimal intermittant aphasia )   History of non-ST  elevation myocardial infarction (NSTEMI) 07-01-2010  and 01/ 2016   History of prostatitis    History of resection of small bowel 08/28/2014   small bowel perforation,  s/p resction    History of retinal tear 04/2013   right micro-rupture , no occlusion, retinal artery branch w/ vision loss,  per imaging no infarct/ occlusion   History of thrombosis 08/2014   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, provoked by testosterone injection,  08-09-2014 s/p thrombolytic lysis VIR    Hyperlipidemia with target LDL less than 70 09/01/2014   May-Thurner syndrome    Myocardial infarction ([place]) 2011   and 2016   Nocturia    PAC (premature atrial contraction)    pt hx palpitations, event monitor 08-18-2017  showed NS w/ occasional PACs, rare PVCs   Peripheral vascular disease ([place])    Prostate cancer [facility]) urologist-- dr herrick/  oncologist-- dr Tammi Klippel   dx 09-04-2020,  Stage T1c, Gleason 4+3    S/P drug eluting coronary stent placement 07/01/2010   DES x3  to prox and distal RCA   S/P percutaneous transluminal coronary angioplasty 12/ 2013 and 01/ 2016   for ISR  RCA   Stroke ([place]) 2011   some residual aphasia    Past Surgical History:  Procedure Laterality Date   BOWEL RESECTION N/A 08/28/2014   Procedure: SMALL BOWEL RESECTION;  Surgeon: [name], MD;  Location: [place];  Service: General;  Laterality: N/A;   CARDIAC CATHETERIZATION N/A 07/20/2015   Procedure: Left Heart Cath and Coronary Angiography;  Surgeon: [name], MD;  Location: [place] [facility];  Service: Cardiovascular;  15% ISR in the RCA stent segment, 20% proximal-mid LAD as well as ostial and proximal circumflex. Normal LV function.   CARDIAC CATHETERIZATION N/A 07/23/2016   Procedure: Left Heart Cath and Coronary Angiography;  Surgeon: [name], MD;  Location: [place] [facility];  Service: Cardiovascular: E 55-65%. ~15% diffuse RCA ISR, ~20% diffuse pLAD & pCx   CORONARY ANGIOPLASTY   07/06/2012; 08/15/2014   a) 12/'13: PTCA of 80% ISR mRCA - AngioSculpt; b) PTCA of  ISR/thrombosis   CORONARY ANGIOPLASTY WITH STENT PLACEMENT  07-01-2010  dr harding @[facility]    inferior wall MI - 3 Taxus Ion DES (3.0x53mm, 3.0x7mm, 3.5x78mm) to prox and mid RCA   CYSTOSCOPY N/A 01/10/2021   Procedure: CYSTOSCOPY FLEXIBLE;  Surgeon: Ardis Hughs, MD;  Location: [facility];  Service: Urology;  Laterality: N/A;   INCISIONAL HERNIA REPAIR N/A 10/15/2017   Procedure: HERNIA REPAIR INCISIONAL;  Surgeon: [name], MD;  [facility];  Service: General;  Laterality: N/A;   INGUINAL HERNIA REPAIR Bilateral 05/2010   INSERTION OF MESH N/A 10/15/2017   Procedure: INSERTION OF MESH;  Surgeon: [name], MD;  [facility];  Service: General;  Laterality: N/A;   INTRAVASCULAR ULTRASOUND/IVUS Left 04/29/2021   Procedure: Intravascular Ultrasound/IVUS;  Surgeon: Waynetta Sandy, MD;  Location: [place] [facility];  Service: Cardiovascular;  Laterality: Left;   IVC FILTER INSERTION N/A 08/19/2021   Procedure: IVC FILTER INSERTION;  Surgeon: Waynetta Sandy, MD;  Location: [place] [facility];  Service: Cardiovascular;  Laterality: N/A;   KNEE ARTHROSCOPY W/ MENISCAL REPAIR Left 06/2011   LAPAROSCOPIC APPENDECTOMY N/A 08/28/2014   Procedure: APPENDECTOMY LAPAROSCOPIC;  Surgeon: [name], MD;  Location: [place];  Service: General;  Laterality: N/A;   LAPAROSCOPY  08/28/2014   Procedure: LAPAROSCOPY DIAGNOSTIC;  Surgeon: [name], MD;  Location: [place];  Service: General;;   LEFT HEART CATHETERIZATION WITH CORONARY ANGIOGRAM N/A 06/11/2012   Procedure: LEFT HEART CATHETERIZATION WITH CORONARY ANGIOGRAM;  Surgeon: [name], MD;  Location: [place] C[facility];  Service: Cardiovascular;  Laterality: N/A;   LEFT HEART CATHETERIZATION WITH CORONARY ANGIOGRAM N/A 04/18/2013   Procedure: LEFT HEART CATHETERIZATION WITH CORONARY ANGIOGRAM;  Surgeon: [name], MD;  Location: [facility] CATH LAB;  Service: Cardiovascular;  Laterality: N/A;   LEFT HEART CATHETERIZATION WITH CORONARY ANGIOGRAM N/A 08/15/2014   Procedure: LEFT HEART CATHETERIZATION WITH CORONARY ANGIOGRAM;  Surgeon: [name], MD;  Location: [facility] CATH LAB;  Service: Cardiovascular;  Laterality: N/A;   LOWER EXTREMITY VENOGRAPHY Left 04/29/2021   Procedure: LOWER EXTREMITY VENOGRAPHY;  Surgeon: Waynetta Sandy, MD;  Location: [place] [facility];  Service: Cardiovascular;  Laterality: Left;   LOWER EXTREMITY VENOGRAPHY Left 08/19/2021   Procedure: LOWER EXTREMITY VENOGRAPHY;  Surgeon: Waynetta Sandy, MD;  Location: [place] [facility];  Service: Cardiovascular;  Laterality: Left;   PERCUTANEOUS CORONARY STENT INTERVENTION (PCI-S) N/A 07/06/2012   Procedure: PERCUTANEOUS CORONARY STENT INTERVENTION (PCI-S);  Surgeon: [name], MD;  Location: [facility] CATH LAB;  Service: Cardiovascular;  Laterality: N/A;   PERIPHERAL VASCULAR INTERVENTION Left 08/19/2021   Procedure: PERIPHERAL VASCULAR INTERVENTION;  Surgeon: Waynetta Sandy, MD;  Location: [place] [facility];  Service: Cardiovascular;  Laterality: Left;  Common Iliac Venous   PERIPHERAL VASCULAR THROMBECTOMY Left 04/29/2021   Procedure: PERIPHERAL VASCULAR THROMBECTOMY;  Surgeon: Waynetta Sandy, MD;  Location: [place] [facility];  Service: Cardiovascular;  Laterality: Left;   RADIOACTIVE SEED IMPLANT N/A 01/10/2021   Procedure: RADIOACTIVE SEED IMPLANT/BRACHYTHERAPY IMPLANT;  Surgeon: Ardis Hughs, MD;  [facility];  Service: Urology;  Laterality: N/A;   SEPTOPLASTY  11-01-2001 @ [place]   SPACE OAR INSTILLATION N/A 01/10/2021   Procedure: SPACE OAR INSTILLATION;  Surgeon: Ardis Hughs, MD;  Location: [facility];  Service: Urology;  Laterality: N/A;   TONSILLECTOMY AND ADENOIDECTOMY  age 46   TRANSTHORACIC ECHOCARDIOGRAM  08/15/2014   Moderate concentric  LVH. EF 60-65% with no regional WMA. Gr 1 DD, otherwise normal    MEDICATIONS:  acetaminophen (TYLENOL) 325 MG tablet   aspirin EC 81 MG tablet   carvedilol (COREG) 12.5 MG tablet   Empagliflozin-metFORMIN HCl ER (SYNJARDY XR) 25-1000 MG TB24   Evolocumab (REPATHA SURECLICK) 572 MG/ML SOAJ   furosemide (LASIX) 40 MG tablet   nitroGLYCERIN (NITROSTAT) 0.4 MG SL tablet   olmesartan (BENICAR) 40 MG tablet   Propylene Glycol (SYSTANE COMPLETE) 0.6 % SOLN   rivaroxaban (XARELTO) 20 MG TABS tablet   No current facility-administered medications for this encounter.    Dylan Felix Ward, PA-C [facility] [phone]

## 2021-10-01 NOTE — Anesthesia Preprocedure Evaluation (Addendum)
Anesthesia Evaluation  Patient identified by MRN, date of birth, ID band Patient awake    Reviewed: Allergy & Precautions, NPO status , Patient's Chart, lab work & pertinent test results, reviewed documented beta blocker date and time   History of Anesthesia Complications Negative for: history of anesthetic complications  Airway Mallampati: II  TM Distance: >3 FB Neck ROM: Full    Dental  (+) Dental Advisory Given   Pulmonary former smoker, PE (IVC filter) 10/07/2021 SARS coronavirus NEG   breath sounds clear to auscultation       Cardiovascular hypertension, Pt. on home beta blockers and Pt. on medications (-) angina+ CAD, + Past MI, + Cardiac Stents and + Peripheral Vascular Disease   Rhythm:Regular Rate:Normal  07/2021 ECHO: EF 60-65%, normal LVF, mildly reduced RVF, mod PI   Neuro/Psych CVA (speech difficulty), Residual Symptoms    GI/Hepatic Neg liver ROS, H/o small bowel perf   Endo/Other  diabetes (glu 134), Oral Hypoglycemic Agents  Renal/GU negative Renal ROS   Prostate cancer    Musculoskeletal  (+) Arthritis ,   Abdominal (+) + obese,   Peds  Hematology Xarelto: last dose sat 6:30pm   Anesthesia Other Findings   Reproductive/Obstetrics                           Anesthesia Physical Anesthesia Plan  ASA: 3  Anesthesia Plan: Spinal   Post-op Pain Management: Regional block*   Induction:   PONV Risk Score and Plan: 1 and Ondansetron and Treatment may vary due to age or medical condition  Airway Management Planned: Natural Airway and Simple Face Mask  Additional Equipment: None  Intra-op Plan:   Post-operative Plan:   Informed Consent:   Plan Discussed with:   Anesthesia Plan Comments: (See PAT note 09/26/2021)       Anesthesia Quick Evaluation

## 2021-10-07 ENCOUNTER — Other Ambulatory Visit: Payer: Self-pay

## 2021-10-07 ENCOUNTER — Encounter (HOSPITAL_COMMUNITY)
Admission: RE | Admit: 2021-10-07 | Discharge: 2021-10-07 | Disposition: A | Discharge status: Home / Self Care | Payer: 59 | Source: Ambulatory Visit | Attending: Orthopedic Surgery | Admitting: Orthopedic Surgery

## 2021-10-07 DIAGNOSIS — Z01812 Encounter for preprocedural laboratory examination: Secondary | ICD-10-CM | POA: Diagnosis not present

## 2021-10-07 DIAGNOSIS — Z20822 Contact with and (suspected) exposure to covid-19: Secondary | ICD-10-CM | POA: Diagnosis not present

## 2021-10-07 LAB — SARS CORONAVIRUS 2 (TAT 6-24 HRS): SARS Coronavirus 2: NEGATIVE

## 2021-10-09 ENCOUNTER — Ambulatory Visit (HOSPITAL_COMMUNITY): Payer: 59 | Admitting: Physician Assistant

## 2021-10-09 ENCOUNTER — Encounter (HOSPITAL_COMMUNITY): Payer: Self-pay | Admitting: Orthopedic Surgery

## 2021-10-09 ENCOUNTER — Encounter (HOSPITAL_COMMUNITY)
Admission: RE | Disposition: A | Discharge status: Home / Self Care | Payer: Self-pay | Source: Home / Self Care | Attending: Orthopedic Surgery

## 2021-10-09 ENCOUNTER — Other Ambulatory Visit: Payer: Self-pay

## 2021-10-09 ENCOUNTER — Ambulatory Visit (HOSPITAL_BASED_OUTPATIENT_CLINIC_OR_DEPARTMENT_OTHER): Payer: 59 | Admitting: Certified Registered"

## 2021-10-09 ENCOUNTER — Ambulatory Visit (HOSPITAL_COMMUNITY)
Admission: RE | Admit: 2021-10-09 | Discharge: 2021-10-10 | Disposition: A | Discharge status: Home / Self Care | Payer: 59 | Attending: Orthopedic Surgery | Admitting: Orthopedic Surgery

## 2021-10-09 ENCOUNTER — Ambulatory Visit (HOSPITAL_COMMUNITY): Payer: 59

## 2021-10-09 DIAGNOSIS — Z01818 Encounter for other preprocedural examination: Secondary | ICD-10-CM

## 2021-10-09 DIAGNOSIS — Z8546 Personal history of malignant neoplasm of prostate: Secondary | ICD-10-CM | POA: Insufficient documentation

## 2021-10-09 DIAGNOSIS — I2510 Atherosclerotic heart disease of native coronary artery without angina pectoris: Secondary | ICD-10-CM | POA: Diagnosis not present

## 2021-10-09 DIAGNOSIS — I252 Old myocardial infarction: Secondary | ICD-10-CM

## 2021-10-09 DIAGNOSIS — I10 Essential (primary) hypertension: Secondary | ICD-10-CM

## 2021-10-09 DIAGNOSIS — Z79899 Other long term (current) drug therapy: Secondary | ICD-10-CM | POA: Insufficient documentation

## 2021-10-09 DIAGNOSIS — Z87891 Personal history of nicotine dependence: Secondary | ICD-10-CM | POA: Insufficient documentation

## 2021-10-09 DIAGNOSIS — M1712 Unilateral primary osteoarthritis, left knee: Secondary | ICD-10-CM

## 2021-10-09 DIAGNOSIS — Z86711 Personal history of pulmonary embolism: Secondary | ICD-10-CM | POA: Insufficient documentation

## 2021-10-09 DIAGNOSIS — Z7984 Long term (current) use of oral hypoglycemic drugs: Secondary | ICD-10-CM | POA: Insufficient documentation

## 2021-10-09 DIAGNOSIS — Z7901 Long term (current) use of anticoagulants: Secondary | ICD-10-CM | POA: Insufficient documentation

## 2021-10-09 DIAGNOSIS — Z955 Presence of coronary angioplasty implant and graft: Secondary | ICD-10-CM | POA: Insufficient documentation

## 2021-10-09 DIAGNOSIS — E1151 Type 2 diabetes mellitus with diabetic peripheral angiopathy without gangrene: Secondary | ICD-10-CM | POA: Insufficient documentation

## 2021-10-09 DIAGNOSIS — I69328 Other speech and language deficits following cerebral infarction: Secondary | ICD-10-CM | POA: Insufficient documentation

## 2021-10-09 DIAGNOSIS — Z96652 Presence of left artificial knee joint: Secondary | ICD-10-CM

## 2021-10-09 HISTORY — PX: KNEE ARTHROPLASTY: SHX992

## 2021-10-09 LAB — GLUCOSE, CAPILLARY
Glucose-Capillary: 134 mg/dL — ABNORMAL HIGH (ref 70–99)
Glucose-Capillary: 156 mg/dL — ABNORMAL HIGH (ref 70–99)
Glucose-Capillary: 154 mg/dL — ABNORMAL HIGH (ref 70–99)
Glucose-Capillary: 234 mg/dL — ABNORMAL HIGH (ref 70–99)

## 2021-10-09 SURGERY — ARTHROPLASTY, KNEE, TOTAL, USING IMAGELESS COMPUTER-ASSISTED NAVIGATION
Anesthesia: Spinal | Site: Knee | Laterality: Left

## 2021-10-09 MED ORDER — POLYETHYLENE GLYCOL 3350 17 G PO PACK: 17.0000 g | PACK | Freq: Every day | ORAL | Status: DC | PRN

## 2021-10-09 MED ORDER — POVIDONE-IODINE 10 % EX SWAB
2.0000 "application " | Freq: Once | CUTANEOUS | Status: AC
  Administered 2021-10-09: 2 via TOPICAL

## 2021-10-09 MED ORDER — ONDANSETRON HCL 4 MG/2ML IJ SOLN
INTRAMUSCULAR | Status: DC | PRN
  Administered 2021-10-09: 4 mg via INTRAVENOUS

## 2021-10-09 MED ORDER — FUROSEMIDE 40 MG PO TABS: 40.0000 mg | ORAL_TABLET | ORAL | Status: DC

## 2021-10-09 MED ORDER — BUPIVACAINE IN DEXTROSE 0.75-8.25 % IT SOLN
INTRATHECAL | Status: DC | PRN
  Administered 2021-10-09: 15 mg via INTRATHECAL

## 2021-10-09 MED ORDER — LIDOCAINE 2% (20 MG/ML) 5 ML SYRINGE
INTRAMUSCULAR | Status: DC | PRN
Start: 2021-10-09 — End: 2021-10-09
  Administered 2021-10-09: 40 mg via INTRAVENOUS

## 2021-10-09 MED ORDER — MIDAZOLAM HCL 2 MG/2ML IJ SOLN
INTRAMUSCULAR | Status: DC | PRN
Start: 2021-10-09 — End: 2021-10-09
  Administered 2021-10-09: 2 mg via INTRAVENOUS

## 2021-10-09 MED ORDER — BUPIVACAINE-EPINEPHRINE (PF) 0.25% -1:200000 IJ SOLN
INTRAMUSCULAR | Status: AC
  Filled 2021-10-09: qty 30

## 2021-10-09 MED ORDER — KETOROLAC TROMETHAMINE 30 MG/ML IJ SOLN
INTRAMUSCULAR | Status: AC
  Filled 2021-10-09: qty 1

## 2021-10-09 MED ORDER — METOCLOPRAMIDE HCL 5 MG PO TABS: 5.0000 mg | ORAL_TABLET | Freq: Three times a day (TID) | ORAL | Status: DC | PRN

## 2021-10-09 MED ORDER — OXYCODONE HCL 5 MG PO TABS: 5.0000 mg | ORAL_TABLET | Freq: Once | ORAL | Status: DC | PRN

## 2021-10-09 MED ORDER — OXYCODONE HCL 5 MG/5ML PO SOLN: 5.0000 mg | Freq: Once | ORAL | Status: DC | PRN

## 2021-10-09 MED ORDER — FENTANYL CITRATE PF 50 MCG/ML IJ SOSY
50.0000 ug | PREFILLED_SYRINGE | INTRAMUSCULAR | Status: DC
  Administered 2021-10-09: 100 ug via INTRAVENOUS
  Filled 2021-10-09: qty 2

## 2021-10-09 MED ORDER — ONDANSETRON HCL 4 MG/2ML IJ SOLN
INTRAMUSCULAR | Status: AC
  Filled 2021-10-09: qty 2

## 2021-10-09 MED ORDER — LACTATED RINGERS IV SOLN: INTRAVENOUS | Status: DC

## 2021-10-09 MED ORDER — POVIDONE-IODINE 10 % EX SWAB: 2.0000 "application " | Freq: Once | CUTANEOUS | Status: AC

## 2021-10-09 MED ORDER — SODIUM CHLORIDE (PF) 0.9 % IJ SOLN
INTRAMUSCULAR | Status: DC | PRN
  Administered 2021-10-09: 30 mL

## 2021-10-09 MED ORDER — MIDAZOLAM HCL 2 MG/2ML IJ SOLN
1.0000 mg | INTRAMUSCULAR | Status: DC
  Administered 2021-10-09: 2 mg via INTRAVENOUS
  Filled 2021-10-09: qty 2

## 2021-10-09 MED ORDER — HYDROMORPHONE HCL 2 MG PO TABS
1.0000 mg | ORAL_TABLET | ORAL | Status: DC | PRN
  Administered 2021-10-09 – 2021-10-10 (×4): 2 mg via ORAL
  Filled 2021-10-09 (×4): qty 1

## 2021-10-09 MED ORDER — STERILE WATER FOR IRRIGATION IR SOLN
Status: DC | PRN
  Administered 2021-10-09: 2000 mL

## 2021-10-09 MED ORDER — HYDROMORPHONE HCL 1 MG/ML IJ SOLN
INTRAMUSCULAR | Status: AC
  Filled 2021-10-09: qty 1

## 2021-10-09 MED ORDER — PROPOFOL 500 MG/50ML IV EMUL
INTRAVENOUS | Status: AC
  Filled 2021-10-09: qty 50

## 2021-10-09 MED ORDER — HYDROMORPHONE HCL 1 MG/ML IJ SOLN
0.2500 mg | INTRAMUSCULAR | Status: DC | PRN
  Administered 2021-10-09: 0.5 mg via INTRAVENOUS

## 2021-10-09 MED ORDER — DEXAMETHASONE SODIUM PHOSPHATE 10 MG/ML IJ SOLN
INTRAMUSCULAR | Status: DC | PRN
  Administered 2021-10-09: 10 mg via INTRAVENOUS

## 2021-10-09 MED ORDER — FUROSEMIDE 40 MG PO TABS
40.0000 mg | ORAL_TABLET | Freq: Every day | ORAL | Status: DC
  Administered 2021-10-10: 08:00:00 40 mg via ORAL
  Filled 2021-10-09: qty 1

## 2021-10-09 MED ORDER — KETOROLAC TROMETHAMINE 15 MG/ML IJ SOLN
15.0000 mg | Freq: Four times a day (QID) | INTRAMUSCULAR | Status: DC
  Administered 2021-10-09 – 2021-10-10 (×2): 15 mg via INTRAVENOUS
  Filled 2021-10-09 (×3): qty 1

## 2021-10-09 MED ORDER — KETOROLAC TROMETHAMINE 30 MG/ML IJ SOLN
INTRAMUSCULAR | Status: DC | PRN
Start: 2021-10-09 — End: 2021-10-09
  Administered 2021-10-09: 30 mg

## 2021-10-09 MED ORDER — FUROSEMIDE 40 MG PO TABS
80.0000 mg | ORAL_TABLET | Freq: Every evening | ORAL | Status: DC
Start: 2021-10-09 — End: 2021-10-10
  Administered 2021-10-09: 80 mg via ORAL
  Filled 2021-10-09: qty 2

## 2021-10-09 MED ORDER — CEFAZOLIN SODIUM-DEXTROSE 2-4 GM/100ML-% IV SOLN
2.0000 g | Freq: Four times a day (QID) | INTRAVENOUS | Status: AC
  Administered 2021-10-09 (×2): 2 g via INTRAVENOUS
  Filled 2021-10-09 (×2): qty 100

## 2021-10-09 MED ORDER — BUPIVACAINE-EPINEPHRINE 0.25% -1:200000 IJ SOLN
INTRAMUSCULAR | Status: DC | PRN
Start: 2021-10-09 — End: 2021-10-09
  Administered 2021-10-09: 30 mL

## 2021-10-09 MED ORDER — PROPOFOL 1000 MG/100ML IV EMUL
INTRAVENOUS | Status: AC
  Filled 2021-10-09: qty 100

## 2021-10-09 MED ORDER — INSULIN ASPART 100 UNIT/ML IJ SOLN
0.0000 [IU] | Freq: Every day | INTRAMUSCULAR | Status: DC
  Administered 2021-10-09: 2 [IU] via SUBCUTANEOUS

## 2021-10-09 MED ORDER — METOCLOPRAMIDE HCL 5 MG/ML IJ SOLN: 5.0000 mg | Freq: Three times a day (TID) | INTRAMUSCULAR | Status: DC | PRN

## 2021-10-09 MED ORDER — ASPIRIN EC 81 MG PO TBEC
81.0000 mg | DELAYED_RELEASE_TABLET | Freq: Every morning | ORAL | Status: DC
  Administered 2021-10-10: 08:00:00 81 mg via ORAL
  Filled 2021-10-09: qty 1

## 2021-10-09 MED ORDER — ACETAMINOPHEN 10 MG/ML IV SOLN
1000.0000 mg | Freq: Once | INTRAVENOUS | Status: AC
  Administered 2021-10-09: 1000 mg via INTRAVENOUS
  Filled 2021-10-09: qty 100

## 2021-10-09 MED ORDER — SODIUM CHLORIDE 0.9 % IR SOLN
Status: DC | PRN
Start: 2021-10-09 — End: 2021-10-09
  Administered 2021-10-09 (×2): 1000 mL

## 2021-10-09 MED ORDER — HYDROMORPHONE HCL 1 MG/ML IJ SOLN: 0.5000 mg | INTRAMUSCULAR | Status: DC | PRN

## 2021-10-09 MED ORDER — CHLORHEXIDINE GLUCONATE 0.12 % MT SOLN
15.0000 mL | Freq: Once | OROMUCOSAL | Status: AC
  Administered 2021-10-09: 15 mL via OROMUCOSAL

## 2021-10-09 MED ORDER — PROCHLORPERAZINE MALEATE 5 MG PO TABS
5.0000 mg | ORAL_TABLET | Freq: Four times a day (QID) | ORAL | Status: DC | PRN
  Filled 2021-10-09: qty 1

## 2021-10-09 MED ORDER — SENNA 8.6 MG PO TABS
1.0000 | ORAL_TABLET | Freq: Two times a day (BID) | ORAL | Status: DC
  Administered 2021-10-09: 8.6 mg via ORAL
  Filled 2021-10-09 (×2): qty 1

## 2021-10-09 MED ORDER — CARVEDILOL 12.5 MG PO TABS
12.5000 mg | ORAL_TABLET | Freq: Two times a day (BID) | ORAL | Status: DC
  Administered 2021-10-09 – 2021-10-10 (×2): 12.5 mg via ORAL
  Filled 2021-10-09 (×2): qty 1

## 2021-10-09 MED ORDER — PROPOFOL 10 MG/ML IV BOLUS
INTRAVENOUS | Status: DC | PRN
  Administered 2021-10-09: 20 mg via INTRAVENOUS

## 2021-10-09 MED ORDER — DIPHENHYDRAMINE HCL 12.5 MG/5ML PO ELIX: 12.5000 mg | ORAL_SOLUTION | ORAL | Status: DC | PRN

## 2021-10-09 MED ORDER — MIDAZOLAM HCL 2 MG/2ML IJ SOLN
INTRAMUSCULAR | Status: AC
  Filled 2021-10-09: qty 2

## 2021-10-09 MED ORDER — SODIUM CHLORIDE 0.9% IV SOLUTION
INTRAVENOUS | Status: DC | PRN
  Administered 2021-10-09: 1000 mL

## 2021-10-09 MED ORDER — MEPERIDINE HCL 50 MG/ML IJ SOLN: 6.2500 mg | INTRAMUSCULAR | Status: DC | PRN

## 2021-10-09 MED ORDER — METHOCARBAMOL 500 MG IVPB - SIMPLE MED
500.0000 mg | Freq: Four times a day (QID) | INTRAVENOUS | Status: DC | PRN
  Filled 2021-10-09: qty 50

## 2021-10-09 MED ORDER — PRONTOSAN WOUND IRRIGATION OPTIME
TOPICAL | Status: DC | PRN
  Administered 2021-10-09: 1 via TOPICAL

## 2021-10-09 MED ORDER — PHENYLEPHRINE HCL-NACL 20-0.9 MG/250ML-% IV SOLN
INTRAVENOUS | Status: DC | PRN
  Administered 2021-10-09: 10 ug/min via INTRAVENOUS

## 2021-10-09 MED ORDER — CEFAZOLIN SODIUM-DEXTROSE 2-4 GM/100ML-% IV SOLN
2.0000 g | INTRAVENOUS | Status: AC
  Administered 2021-10-09: 2 g via INTRAVENOUS
  Filled 2021-10-09: qty 100

## 2021-10-09 MED ORDER — RIVAROXABAN 10 MG PO TABS
10.0000 mg | ORAL_TABLET | Freq: Every day | ORAL | Status: DC
  Administered 2021-10-10: 08:00:00 10 mg via ORAL
  Filled 2021-10-09: qty 1

## 2021-10-09 MED ORDER — INSULIN ASPART 100 UNIT/ML IJ SOLN
0.0000 [IU] | Freq: Three times a day (TID) | INTRAMUSCULAR | Status: DC
  Administered 2021-10-09: 2 [IU] via SUBCUTANEOUS
  Administered 2021-10-10: 08:00:00 1 [IU] via SUBCUTANEOUS

## 2021-10-09 MED ORDER — ORAL CARE MOUTH RINSE: 15.0000 mL | Freq: Once | OROMUCOSAL | Status: AC

## 2021-10-09 MED ORDER — ISOPROPYL ALCOHOL 70 % SOLN
Status: DC | PRN
  Administered 2021-10-09: 1 via TOPICAL

## 2021-10-09 MED ORDER — METHOCARBAMOL 500 MG PO TABS
500.0000 mg | ORAL_TABLET | Freq: Four times a day (QID) | ORAL | Status: DC | PRN
  Administered 2021-10-09 – 2021-10-10 (×3): 500 mg via ORAL
  Filled 2021-10-09 (×3): qty 1

## 2021-10-09 MED ORDER — PROPOFOL 500 MG/50ML IV EMUL
INTRAVENOUS | Status: DC | PRN
  Administered 2021-10-09: 75 ug/kg/min via INTRAVENOUS

## 2021-10-09 MED ORDER — PROPOFOL 10 MG/ML IV BOLUS
INTRAVENOUS | Status: AC
  Filled 2021-10-09: qty 20

## 2021-10-09 MED ORDER — TRANEXAMIC ACID-NACL 1000-0.7 MG/100ML-% IV SOLN
1000.0000 mg | INTRAVENOUS | Status: AC
  Administered 2021-10-09: 1000 mg via INTRAVENOUS
  Filled 2021-10-09: qty 100

## 2021-10-09 MED ORDER — MIDAZOLAM HCL 2 MG/2ML IJ SOLN: 0.5000 mg | Freq: Once | INTRAMUSCULAR | Status: DC | PRN

## 2021-10-09 MED ORDER — SODIUM CHLORIDE 0.9 % IV SOLN: INTRAVENOUS | Status: DC

## 2021-10-09 MED ORDER — NITROGLYCERIN 0.4 MG SL SUBL: 0.4000 mg | SUBLINGUAL_TABLET | SUBLINGUAL | Status: DC | PRN

## 2021-10-09 MED ORDER — PHENOL 1.4 % MT LIQD: 1.0000 | OROMUCOSAL | Status: DC | PRN

## 2021-10-09 MED ORDER — DOCUSATE SODIUM 100 MG PO CAPS
100.0000 mg | ORAL_CAPSULE | Freq: Two times a day (BID) | ORAL | Status: DC
  Administered 2021-10-09: 100 mg via ORAL
  Filled 2021-10-09 (×2): qty 1

## 2021-10-09 MED ORDER — ALUM & MAG HYDROXIDE-SIMETH 200-200-20 MG/5ML PO SUSP: 30.0000 mL | ORAL | Status: DC | PRN

## 2021-10-09 MED ORDER — ROPIVACAINE HCL 7.5 MG/ML IJ SOLN
INTRAMUSCULAR | Status: DC | PRN
  Administered 2021-10-09: 20 mL via PERINEURAL

## 2021-10-09 MED ORDER — ACETAMINOPHEN 325 MG PO TABS
325.0000 mg | ORAL_TABLET | Freq: Four times a day (QID) | ORAL | Status: DC | PRN
  Administered 2021-10-10: 11:00:00 650 mg via ORAL
  Filled 2021-10-09: qty 2

## 2021-10-09 MED ORDER — MENTHOL 3 MG MT LOZG: 1.0000 | LOZENGE | OROMUCOSAL | Status: DC | PRN

## 2021-10-09 MED ORDER — DEXAMETHASONE SODIUM PHOSPHATE 10 MG/ML IJ SOLN
INTRAMUSCULAR | Status: AC
  Filled 2021-10-09: qty 1

## 2021-10-09 SURGICAL SUPPLY — 69 items
BAG COUNTER SPONGE SURGICOUNT (BAG) ×1 IMPLANT
BAG ZIPLOCK 12X15 (MISCELLANEOUS) ×1 IMPLANT
BATTERY INSTRU NAVIGATION (MISCELLANEOUS) ×6 IMPLANT
BLADE SAW RECIPROCATING 77.5 (BLADE) ×2 IMPLANT
BNDG ELASTIC 4X5.8 VLCR STR LF (GAUZE/BANDAGES/DRESSINGS) ×2 IMPLANT
BNDG ELASTIC 6X5.8 VLCR STR LF (GAUZE/BANDAGES/DRESSINGS) ×2 IMPLANT
CHLORAPREP W/TINT 26 (MISCELLANEOUS) ×4 IMPLANT
COMP FEM PS STD 11 LT (Joint) ×2 IMPLANT
COMP PATELLAR 10X35 METAL (Joint) ×2 IMPLANT
COMP TIB PS G 0D LT (Joint) ×2 IMPLANT
COMPONENT FEM PS STD 11 LT (Joint) IMPLANT
COMPONENT PATELLAR 10X35 METAL (Joint) IMPLANT
COMPONET TIB PS G 0D LT (Joint) IMPLANT
COVER SURGICAL LIGHT HANDLE (MISCELLANEOUS) ×2 IMPLANT
DERMABOND ADVANCED (GAUZE/BANDAGES/DRESSINGS) ×2
DERMABOND ADVANCED .7 DNX12 (GAUZE/BANDAGES/DRESSINGS) ×2 IMPLANT
DRAPE INCISE IOBAN 66X45 STRL (DRAPES) ×2 IMPLANT
DRAPE SHEET LG 3/4 BI-LAMINATE (DRAPES) ×6 IMPLANT
DRAPE U-SHAPE 47X51 STRL (DRAPES) ×2 IMPLANT
DRSG AQUACEL AG ADV 3.5X10 (GAUZE/BANDAGES/DRESSINGS) ×1 IMPLANT
DRSG AQUACEL AG ADV 3.5X14 (GAUZE/BANDAGES/DRESSINGS) ×2 IMPLANT
ELECT BLADE TIP CTD 4 INCH (ELECTRODE) ×2 IMPLANT
ELECT REM PT RETURN 15FT ADLT (MISCELLANEOUS) ×2 IMPLANT
GAUZE SPONGE 4X4 12PLY STRL (GAUZE/BANDAGES/DRESSINGS) ×2 IMPLANT
GLOVE SRG 8 PF TXTR STRL LF DI (GLOVE) ×2 IMPLANT
GLOVE SURG ENC MOIS LTX SZ8.5 (GLOVE) ×4 IMPLANT
GLOVE SURG ENC TEXT LTX SZ7.5 (GLOVE) ×6 IMPLANT
GLOVE SURG UNDER POLY LF SZ8 (GLOVE) ×4
GLOVE SURG UNDER POLY LF SZ8.5 (GLOVE) ×2 IMPLANT
GOWN SPEC L3 XXLG W/TWL (GOWN DISPOSABLE) ×2 IMPLANT
GOWN STRL REUS W/TWL XL LVL3 (GOWN DISPOSABLE) ×2 IMPLANT
HANDPIECE INTERPULSE COAX TIP (DISPOSABLE) ×1
HDLS TROCR DRIL PIN KNEE 75 (PIN) ×2
HOLDER FOLEY CATH W/STRAP (MISCELLANEOUS) ×2 IMPLANT
HOOD PEEL AWAY FLYTE STAYCOOL (MISCELLANEOUS) ×6 IMPLANT
JET LAVAGE IRRISEPT WOUND (IRRIGATION / IRRIGATOR)
KIT TURNOVER KIT A (KITS) ×1 IMPLANT
LAVAGE JET IRRISEPT WOUND (IRRIGATION / IRRIGATOR) IMPLANT
LINER TIB ASF PS GH/7-12 10 LT (Liner) ×1 IMPLANT
MARKER SKIN DUAL TIP RULER LAB (MISCELLANEOUS) ×2 IMPLANT
NDL SAFETY ECLIPSE 18X1.5 (NEEDLE) ×1 IMPLANT
NDL SPNL 18GX3.5 QUINCKE PK (NEEDLE) ×1 IMPLANT
NEEDLE HYPO 18GX1.5 SHARP (NEEDLE) ×2
NEEDLE SPNL 18GX3.5 QUINCKE PK (NEEDLE) ×2 IMPLANT
NS IRRIG 1000ML POUR BTL (IV SOLUTION) ×2 IMPLANT
PACK TOTAL KNEE CUSTOM (KITS) ×2 IMPLANT
PADDING CAST COTTON 6X4 STRL (CAST SUPPLIES) ×2 IMPLANT
PIN DRILL HDLS TROCAR 75 4PK (PIN) IMPLANT
PROTECTOR NERVE ULNAR (MISCELLANEOUS) ×2 IMPLANT
SAW OSC TIP CART 19.5X105X1.3 (SAW) ×2 IMPLANT
SEALER BIPOLAR AQUA 6.0 (INSTRUMENTS) ×2 IMPLANT
SET HNDPC FAN SPRY TIP SCT (DISPOSABLE) ×1 IMPLANT
SET PAD KNEE POSITIONER (MISCELLANEOUS) ×2 IMPLANT
SPIKE FLUID TRANSFER (MISCELLANEOUS) ×4 IMPLANT
SPONGE T-LAP 18X18 ~~LOC~~+RFID (SPONGE) ×6 IMPLANT
SUT MNCRL AB 3-0 PS2 18 (SUTURE) ×2 IMPLANT
SUT MNCRL AB 4-0 PS2 18 (SUTURE) ×2 IMPLANT
SUT MON AB 2-0 CT1 36 (SUTURE) ×2 IMPLANT
SUT STRATAFIX PDO 1 14 VIOLET (SUTURE) ×1
SUT STRATFX PDO 1 14 VIOLET (SUTURE) ×1
SUT VIC AB 1 CTX 36 (SUTURE) ×2
SUT VIC AB 1 CTX36XBRD ANBCTR (SUTURE) ×2 IMPLANT
SUT VIC AB 2-0 CT1 27 (SUTURE) ×1
SUT VIC AB 2-0 CT1 TAPERPNT 27 (SUTURE) ×1 IMPLANT
SUTURE STRATFX PDO 1 14 VIOLET (SUTURE) ×1 IMPLANT
TRAY FOLEY MTR SLVR 16FR STAT (SET/KITS/TRAYS/PACK) ×1 IMPLANT
TUBE SUCTION HIGH CAP CLEAR NV (SUCTIONS) ×2 IMPLANT
WATER STERILE IRR 1000ML POUR (IV SOLUTION) ×4 IMPLANT
WRAP KNEE MAXI GEL POST OP (GAUZE/BANDAGES/DRESSINGS) ×1 IMPLANT

## 2021-10-09 NOTE — Plan of Care (Signed)
?  Problem: Pain Managment: ?Goal: General experience of comfort will improve ?Outcome: Progressing ?  ?Problem: Safety: ?Goal: Ability to remain free from injury will improve ?Outcome: Progressing ?  ?Problem: Safety: ?Goal: Ability to remain free from injury will improve ?Outcome: Progressing ?  ?

## 2021-10-09 NOTE — Anesthesia Procedure Notes (Signed)
Spinal ? ?Patient location during procedure: OR ?End time: 10/09/2021 12:26 PM ?Reason for block: surgical anesthesia ?Staffing ?Performed: anesthesiologist  ?Anesthesiologist: Annye Asa, MD ?Preanesthetic Checklist ?Completed: patient identified, IV checked, site marked, risks and benefits discussed, surgical consent, monitors and equipment checked, pre-op evaluation and timeout performed ?Spinal Block ?Patient position: sitting ?Prep: DuraPrep and site prepped and draped ?Patient monitoring: blood pressure, continuous pulse ox, cardiac monitor and heart rate ?Approach: midline ?Location: L3-4 ?Injection technique: single-shot ?Needle ?Needle type: Pencan and Introducer  ?Needle gauge: 24 G ?Needle length: 9 cm ?Assessment ?Events: CSF return ?Additional Notes ?Pt identified in Operating room.  Monitors applied. Working IV access confirmed. Sterile prep, drape lumbar spine.  1% lido local L 3,4.  #24ga Pencan into clear CSF L 3,4.  '15mg'$  0.75% Bupivacaine with dextrose injected with asp CSF beginning and end of injection.  Patient asymptomatic, VSS, no heme aspirated, tolerated well.  Jenita Seashore, MD ?  ? ? ? ?

## 2021-10-09 NOTE — Discharge Instructions (Signed)
 Dr. Talissa Apple Total Joint Specialist [facility] [address] [place], [place] [address] [phone]  TOTAL KNEE REPLACEMENT POSTOPERATIVE DIRECTIONS    Knee Rehabilitation, Guidelines Following Surgery  Results after knee surgery are often greatly improved when you follow the exercise, range of motion and muscle strengthening exercises prescribed by your doctor. Safety measures are also important to protect the knee from further injury. Any time any of these exercises cause you to have increased pain or swelling in your knee joint, decrease the amount until you are comfortable again and slowly increase them. If you have problems or questions, call your caregiver or physical therapist for advice.   WEIGHT BEARING Weight bearing as tolerated with assist device (walker, cane, etc) as directed, use it as long as suggested by your surgeon or therapist, typically at least 4-6 weeks.  HOME CARE INSTRUCTIONS  Remove items at home which could result in a fall. This includes throw rugs or furniture in walking pathways.  Continue medications as instructed at time of discharge. You may have some home medications which will be placed on hold until you complete the course of blood thinner medication.  You may start showering once you are discharged home but do not submerge the incision under water. Just pat the incision dry and apply a dry gauze dressing on daily. Walk with walker as instructed.  You may resume a sexual relationship in one month or when given the OK by your doctor.  Use walker as long as suggested by your caregivers. Avoid periods of inactivity such as sitting longer than an hour when not asleep. This helps prevent blood clots.  You may put full weight on your legs and walk as much as is comfortable.  You may return to work once you are cleared by your doctor.  Do not drive a car for 6 weeks or until released by you surgeon.  Do not drive while  taking narcotics.  Wear the elastic stockings for three weeks following surgery during the day but you may remove then at night. Make sure you keep all of your appointments after your operation with all of your doctors and caregivers. You should call the office at the above phone number and make an appointment for approximately two weeks after the date of your surgery. Do not remove your surgical dressing. The dressing is waterproof; you may take showers in 3 days, but do not take tub baths or submerge the dressing. Please pick up a stool softener and laxative for home use as long as you are requiring pain medications. ICE to the affected knee every three hours for 30 minutes at a time and then as needed for pain and swelling.  Continue to use ice on the knee for pain and swelling from surgery. You may notice swelling that will progress down to the foot and ankle.  This is normal after surgery.  Elevate the leg when you are not up walking on it.   It is important for you to complete the blood thinner medication as prescribed by your doctor. Continue to use the breathing machine which will help keep your temperature down.  It is common for your temperature to cycle up and down following surgery, especially at night when you are not up moving around and exerting yourself.  The breathing machine keeps your lungs expanded and your temperature down.  RANGE OF MOTION AND STRENGTHENING EXERCISES  Rehabilitation of the knee is important following a knee injury or an   operation. After just a few days of immobilization, the muscles of the thigh which control the knee become weakened and shrink (atrophy). Knee exercises are designed to build up the tone and strength of the thigh muscles and to improve knee motion. Often times heat used for twenty to thirty minutes before working out will loosen up your tissues and help with improving the range of motion but do not use heat for the first two weeks following surgery.  These exercises can be done on a training (exercise) mat, on the floor, on a table or on a bed. Use what ever works the best and is most comfortable for you Knee exercises include:  Leg Lifts - While your knee is still immobilized in a splint or cast, you can do straight leg raises. Lift the leg to 60 degrees, hold for 3 sec, and slowly lower the leg. Repeat 10-20 times 2-3 times daily. Perform this exercise against resistance later as your knee gets better.  Quad and Hamstring Sets - Tighten up the muscle on the front of the thigh (Quad) and hold for 5-10 sec. Repeat this 10-20 times hourly. Hamstring sets are done by pushing the foot backward against an object and holding for 5-10 sec. Repeat as with quad sets.  A rehabilitation program following serious knee injuries can speed recovery and prevent re-injury in the future due to weakened muscles. Contact your doctor or a physical therapist for more information on knee rehabilitation.   POST-OPERATIVE OPIOID TAPER INSTRUCTIONS: It is important to wean off of your opioid medication as soon as possible. If you do not need pain medication after your surgery it is ok to stop day one. Opioids include: Codeine, Hydrocodone(Norco, Vicodin), Oxycodone(Percocet, oxycontin) and hydromorphone amongst others.  Long term and even short term use of opiods can cause: Increased pain response Dependence Constipation Depression Respiratory depression And more.  Withdrawal symptoms can include Flu like symptoms Nausea, vomiting And more Techniques to manage these symptoms Hydrate well Eat regular healthy meals Stay active Use relaxation techniques(deep breathing, meditating, yoga) Do Not substitute Alcohol to help with tapering If you have been on opioids for less than two weeks and do not have pain than it is ok to stop all together.  Plan to wean off of opioids This plan should start within one week post op of your joint replacement. Maintain the same  interval or time between taking each dose and first decrease the dose.  Cut the total daily intake of opioids by one tablet each day Next start to increase the time between doses. The last dose that should be eliminated is the evening dose.    SKILLED REHAB INSTRUCTIONS: If the patient is transferred to a skilled rehab facility following release from the hospital, a list of the current medications will be sent to the facility for the patient to continue.  When discharged from the skilled rehab facility, please have the facility set up the patient's Home Health Physical Therapy prior to being released. Also, the skilled facility will be responsible for providing the patient with their medications at time of release from the facility to include their pain medication, the muscle relaxants, and their blood thinner medication. If the patient is still at the rehab facility at time of the two week follow up appointment, the skilled rehab facility will also need to assist the patient in arranging follow up appointment in our office and any transportation needs.  MAKE SURE YOU:  Understand these instructions.  Will watch   your condition.  Will get help right away if you are not doing well or get worse.    Pick up stool softner and laxative for home use following surgery while on pain medications. Do NOT remove your dressing. You may shower.  Do not take tub baths or submerge incision under water. May shower starting three days after surgery. Please use a clean towel to pat the incision dry following showers. Continue to use ice for pain and swelling after surgery. Do not use any lotions or creams on the incision until instructed by your surgeon.  

## 2021-10-09 NOTE — Progress Notes (Signed)
Assisted Dr. Annye Asa with  left Knee Adductor Canal block. Side rails up, monitors on throughout procedure. See vital signs in flow sheet. Tolerated Procedure well. ? ?

## 2021-10-09 NOTE — Anesthesia Postprocedure Evaluation (Signed)
Anesthesia Post Note ? ?Patient: Dylan [name] ? ?Procedure(s) Performed: COMPUTER ASSISTED TOTAL KNEE ARTHROPLASTY (Left: Knee) ? ?  ? ?Patient location during evaluation: PACU ?Anesthesia Type: Spinal ?Level of consciousness: awake and alert, patient cooperative and oriented ?Pain management: pain level controlled ?Vital Signs Assessment: post-procedure vital signs reviewed and stable ?Respiratory status: spontaneous breathing, respiratory function stable and nonlabored ventilation ?Cardiovascular status: blood pressure returned to baseline and stable ?Postop Assessment: no apparent nausea or vomiting and spinal receding ?Anesthetic complications: no ? ? ?No notable events documented. ? ?Last Vitals:  ?Vitals:  ? 10/09/21 1615 10/09/21 1632  ?BP: 128/85 131/81  ?Pulse: (!) 58 (!) 55  ?Resp: 14   ?Temp: 37 ?C (!) 36.4 ?C  ?SpO2: 97% 94%  ?  ?Last Pain:  ?Vitals:  ? 10/09/21 1632  ?TempSrc: Oral  ?PainSc:   ? ? ?  ?  ?  ?  ?  ?  ? ?Shakeya Kerkman,E. Myleka Moncure ? ? ? ? ?

## 2021-10-09 NOTE — Op Note (Signed)
OPERATIVE REPORT ? ?SURGEON: Rod Can, MD  ? ?ASSISTANT: Nehemiah Massed, PA-C ? ?PREOPERATIVE DIAGNOSIS: Primary Left knee arthritis.  ? ?POSTOPERATIVE DIAGNOSIS: Primary Left knee arthritis.  ? ?PROCEDURE: Computer assisted Left total knee arthroplasty.  ? ?IMPLANTS: Zimmer Persona PPS Cementless CR femur, size 11. ?Persona 0 degree Spiked Keel OsseoTi Tibia, size G. ?Vivacit-E polyethelyene insert, size 10 mm, CR. ?TM standard patella, size 35 mm. ? ?ANESTHESIA:  MAC, Regional, and Spinal ? ?TOURNIQUET TIME: Not utilized.  ? ?ESTIMATED BLOOD LOSS:-250 mL   ? ?ANTIBIOTICS: 3 g Ancef. ? ?DRAINS: None. ? ?COMPLICATIONS: None ?  ?CONDITION: PACU - hemodynamically stable.  ? ?BRIEF CLINICAL NOTE: Dylan [name] is a 56 y.o. male with a long-standing history of Left knee arthritis. After failing conservative management, the patient was indicated for total knee arthroplasty. The risks, benefits, and alternatives to the procedure were explained, and the patient elected to proceed. ? ?PROCEDURE IN DETAIL: Adductor canal block was obtained in the pre-op holding area. Once inside the operative room, spinal anesthesia was obtained, and a foley catheter was inserted. The patient was then positioned and the lower extremity was prepped and draped in the normal sterile surgical fashion.  A time-out was called verifying side and site of surgery. The patient received IV antibiotics within 60 minutes of beginning the procedure. A tourniquet was not utilized. ?  ?An anterior approach to the knee was performed utilizing a midvastus arthrotomy. A medial release was performed and the patellar fat pad was excised. Stryker imageless navigation was used to cut the distal femur perpendicular to the mechanical axis. A freehand patellar resection was performed, and the patella was sized an prepared with 3 lug holes. ? ?Nagivation was used to make a neutral proximal tibia resection, taking 10 mm of bone from the less affected lateral  side with 3 degrees of slope. The menisci were excised. A spacer block was placed, and the alignment and balance in extension were confirmed.  ? ?The distal femur was sized using the 3-degree external rotation guide referencing the posterior femoral cortex. The appropriate 4-in-1 cutting block was pinned into place. Rotation was checked using Whiteside's line, the epicondylar axis, and then confirmed with a spacer block in flexion. The remaining femoral cuts were performed, taking care to protect the MCL. ? ?The tibia was sized and the trial tray was pinned into place. The remaining trail components were inserted. The knee was stable to varus and valgus stress through a full range of motion. The patella tracked centrally, and the PCL was well balanced. The trial components were removed, and the proximal tibial surface was prepared. Final components were impacted into place. The knee was tested for a final time and found to be well balanced. ?  ?The wound was copiously irrigated with Irrisept solution and normal saline using pule lavage.  Marcaine solution was injected into the periarticular soft tissue.  The wound was closed in layers using #1 Vicryl and Stratafix for the fascia, 2-0 Vicryl for the subcutaneous fat, 2-0 Monocryl for the deep dermal layer, 3-0 running Monocryl subcuticular Stitch, and 4-0 Monocryl stay sutures at both ends of the wound. Dermabond was applied to the skin.  Once the glue was fully dried, an Aquacell Ag and compressive dressing were applied.  The patient was transported to the recovery room in stable condition.  Sponge, needle, and instrument counts were correct at the end of the case x2.  The patient tolerated the procedure well and there were no known  complications. ? ?Please note that a surgical assistant was a medical necessity for this procedure in order to perform it in a safe and expeditious manner. Surgical assistant was necessary to retract the ligaments and vital neurovascular  structures to prevent injury to them and also necessary for proper positioning of the limb to allow for anatomic placement of the prosthesis.  ?

## 2021-10-09 NOTE — Transfer of Care (Signed)
Immediate Anesthesia Transfer of Care Note ? ?Patient: Dylan [name] ? ?Procedure(s) Performed: COMPUTER ASSISTED TOTAL KNEE ARTHROPLASTY (Left: Knee) ? ?Patient Location: PACU ? ?Anesthesia Type:Spinal ? ?Level of Consciousness: awake, drowsy and patient cooperative ? ?Airway & Oxygen Therapy: Patient Spontanous Breathing and Patient connected to face mask oxygen ? ?Post-op Assessment: Report given to RN and Post -op Vital signs reviewed and stable ? ?Post vital signs: Reviewed and stable ? ?Last Vitals:  ?Vitals Value Taken Time  ?BP 111/77 10/09/21 1513  ?Temp    ?Pulse 69 10/09/21 1515  ?Resp 13 10/09/21 1515  ?SpO2 99 % 10/09/21 1515  ?Vitals shown include unvalidated device data. ? ?Last Pain:  ?Vitals:  ? 10/09/21 0930  ?TempSrc: Oral  ?PainSc:   ?   ? ?  ? ?Complications: No notable events documented. ?

## 2021-10-09 NOTE — Evaluation (Signed)
Physical Therapy Evaluation ?Patient Details ?Name: Dylan [name] ?MRN: [id number] ?DOB: 04/22/1966 ?Today's Date: 10/09/2021 ? ?History of Present Illness ? Pt is 56 yo male s/p L TKA on 10/09/21.  Pt with hx including but not limited to DVTs, DM2, CAD, vision loss R eye, CVA, NSTEMI, arthritis  ?Clinical Impression ? Pt is s/p TKA resulting in the deficits listed below (see PT Problem List). Pt is very independent and active at baseline.  He has DME and support at home with only 1 STE.  Pt was limited at evaluation due to effects of spinal.  He was able to tighten glutes and had some sensation but upon standing very poor hip control and decreased sensation.  Pt had no pain, good quad activation (no ext lag), and 0-90 degrees ROM.  He is expected to progress very well as spinal wears off.  Pt will benefit from skilled PT to increase their independence and safety with mobility to allow discharge to the venue listed below.  ?   ?   ? ?Recommendations for follow up therapy are one component of a multi-disciplinary discharge planning process, led by the attending physician.  Recommendations may be updated based on patient status, additional functional criteria and insurance authorization. ? ?Follow Up Recommendations Follow physician's recommendations for discharge plan and follow up therapies ? ?  ?Assistance Recommended at Discharge Intermittent Supervision/Assistance  ?Patient can return home with the following ? Assistance with cooking/housework;Help with stairs or ramp for entrance;Two people to help with walking and/or transfers;Two people to help with bathing/dressing/bathroom (limited by spinal - expected to progress) ? ?  ?Equipment Recommendations None recommended by PT  ?Recommendations for Other Services ?    ?  ?Functional Status Assessment Patient has had a recent decline in their functional status and demonstrates the ability to make significant improvements in function in a reasonable and predictable  amount of time.  ? ?  ?Precautions / Restrictions Precautions ?Precautions: Fall ?Restrictions ?Weight Bearing Restrictions: Yes ?LLE Weight Bearing: Weight bearing as tolerated  ? ?  ? ?Mobility ? Bed Mobility ?Overal bed mobility: Needs Assistance ?Bed Mobility: Supine to Sit, Sit to Supine ?  ?  ?Supine to sit: Supervision ?Sit to supine: Supervision ?  ?  ?  ? ?Transfers ?Overall transfer level: Needs assistance ?Equipment used: Rolling walker (2 wheels) ?Transfers: Sit to/from Stand ?Sit to Stand: Min assist, Mod assist ?  ?  ?  ?  ?  ?General transfer comment: Min A to stand but then mod A to steady - knees stable and not buckling but poor hip control - suspect related to spinal , returned to sitting ?  ? ?Ambulation/Gait ?  ?  ?  ?  ?  ?  ?  ?General Gait Details: unable - spinal ? ?Stairs ?  ?  ?  ?  ?  ? ?Wheelchair Mobility ?  ? ?Modified Rankin (Stroke Patients Only) ?  ? ?  ? ?Balance Overall balance assessment: Needs assistance ?Sitting-balance support: No upper extremity supported ?Sitting balance-Leahy Scale: Normal ?  ?  ?Standing balance support: Bilateral upper extremity supported ?Standing balance-Leahy Scale: Poor ?Standing balance comment: REquiring RW, decreased due to spinal ?  ?  ?  ?  ?  ?  ?  ?  ?  ?  ?  ?   ? ? ? ?Pertinent Vitals/Pain Pain Assessment ?Pain Assessment: No/denies pain  ? ? ?Home Living Family/patient expects to be discharged to:: Private residence ?Living Arrangements: Spouse/significant other;Children (Children  are 8 and 72 yo) ?Available Help at Discharge: Family;Available 24 hours/day ?Type of Home: House ?Home Access: Stairs to enter ?Entrance Stairs-Rails: None ?Entrance Stairs-Number of Steps: 1 threshold ?  ?Home Layout: Multi-level;Able to live on main level with bedroom/bathroom ?Home Equipment: Shower Land (2 wheels);BSC/3in1;Cane - single point ?   ?  ?Prior Function Prior Level of Function : Independent/Modified  Independent;Driving;Working/employed ?  ?  ?  ?  ?  ?  ?Mobility Comments: Ambulated wtihout AD ?ADLs Comments: Independent ADLs, IADLs and working ?  ? ? ?Hand Dominance  ?   ? ?  ?Extremity/Trunk Assessment  ? Upper Extremity Assessment ?Upper Extremity Assessment: Overall WFL for tasks assessed ?  ? ?Lower Extremity Assessment ?Lower Extremity Assessment: LLE deficits/detail;RLE deficits/detail ?RLE Deficits / Details: ROM WFL; MMT: hip 2/5, knee 4/5, ankle 4/5 - still weak from spinal ?RLE Sensation: decreased light touch ?LLE Deficits / Details: ROM: ankle and hip WFL, knee 0 to 90 degrees; MMT: ankle 4/5, knee 3/5 not further tested, hip 3/5 ?LLE Sensation: decreased light touch ?  ? ?Cervical / Trunk Assessment ?Cervical / Trunk Assessment: Normal  ?Communication  ? Communication: No difficulties  ?Cognition Arousal/Alertness: Awake/alert ?Behavior During Therapy: [facility] for tasks assessed/performed ?Overall Cognitive Status: Within Functional Limits for tasks assessed ?  ?  ?  ?  ?  ?  ?  ?  ?  ?  ?  ?  ?  ?  ?  ?  ?  ?  ?  ? ?  ?General Comments  Pt was able to quad set and SLR without ext lag.  REporteded slight tingling in buttock but able to tighten glutes.  Upon standing, buttock felt numb and poor hip control - returned to sitting.  ? ?  ?Exercises Total Joint Exercises ?Ankle Circles/Pumps: AROM, Both, 5 reps, Supine ?Quad Sets: AROM, Both, 5 reps, Supine ?Heel Slides: AROM, Left, 5 reps, Supine  ? ?Assessment/Plan  ?  ?PT Assessment Patient needs continued PT services  ?PT Problem List Decreased strength;Decreased mobility;Decreased range of motion;Decreased activity tolerance;Decreased balance;Decreased knowledge of use of DME ? ?   ?  ?PT Treatment Interventions DME instruction;Therapeutic activities;Modalities;Gait training;Therapeutic exercise;Patient/family education;Stair training;Functional mobility training;Balance training   ? ?PT Goals (Current goals can be found in the Care Plan section)   ?Acute Rehab PT Goals ?Patient Stated Goal: return home ?PT Goal Formulation: With patient/family ?Time For Goal Achievement: 10/23/21 ?Potential to Achieve Goals: Good ? ?  ?Frequency 7X/week ?  ? ? ?Co-evaluation   ?  ?  ?  ?  ? ? ?  ?AM-PAC PT "6 Clicks" Mobility  ?Outcome Measure Help needed turning from your back to your side while in a flat bed without using bedrails?: A Little ?Help needed moving from lying on your back to sitting on the side of a flat bed without using bedrails?: A Little ?Help needed moving to and from a bed to a chair (including a wheelchair)?: A Lot ?Help needed standing up from a chair using your arms (e.g., wheelchair or bedside chair)?: A Lot ?Help needed to walk in hospital room?: Total (limited by spinal) ?Help needed climbing 3-5 steps with a railing? : Total ?6 Click Score: 12 ? ?  ?End of Session Equipment Utilized During Treatment: Gait belt ?Activity Tolerance: Other (comment) (limited by spinal) ?Patient left: in bed;with call bell/phone within reach;with bed alarm set ?Nurse Communication: Mobility status ?PT Visit Diagnosis: Other abnormalities of gait and mobility (R26.89);Muscle weakness (generalized) (M62.81) ?  ? ?  Time: 2130-8657 ?PT Time Calculation (min) (ACUTE ONLY): 30 min ? ? ?Charges:   PT Evaluation ?$PT Eval Low Complexity: 1 Low ?PT Treatments ?$Therapeutic Exercise: 8-22 mins ?  ?   ? ? ?Abran Richard, PT ?Acute Rehab Services ?Pager [phone] ?[facility] [phone] ? ? ?Mikael Spray Patrena Santalucia ?10/09/2021, 5:54 PM ? ?

## 2021-10-09 NOTE — Interval H&P Note (Signed)
History and Physical Interval Note: ? ?10/09/2021 ?10:53 AM ? ?Dylan [name]  has presented today for surgery, with the diagnosis of Left knee osteoarthritis.  The various methods of treatment have been discussed with the patient and family. After consideration of risks, benefits and other options for treatment, the patient has consented to  Procedure(s) with comments: ?COMPUTER ASSISTED TOTAL KNEE ARTHROPLASTY (Left) - 150 as a surgical intervention.  The patient's history has been reviewed, patient examined, no change in status, stable for surgery.  I have reviewed the patient's chart and labs.  Questions were answered to the patient's satisfaction.   ? ? ?Hilton Cork Delories Mauri ? ? ?

## 2021-10-09 NOTE — Anesthesia Procedure Notes (Signed)
Anesthesia Regional Block: Adductor canal block  ? ?Pre-Anesthetic Checklist: , timeout performed,  Correct Patient, Correct Site, Correct Laterality,  Correct Procedure, Correct Position, site marked,  Risks and benefits discussed,  Surgical consent,  Pre-op evaluation,  At surgeon's request and post-op pain management ? ?Laterality: Left and Lower ? ?Prep: chloraprep     ?  ?Needles:  ?Injection technique: Single-shot ? ?Needle Type: Echogenic Needle   ? ? ?Needle Length: 9cm  ?Needle Gauge: 21  ? ? ? ?Additional Needles: ? ? ?Procedures:,,,, ultrasound used (permanent image in chart),,    ?Narrative:  ?Start time: 10/09/2021 10:17 AM ?End time: 10/09/2021 10:23 AM ?Injection made incrementally with aspirations every 5 mL. ? ?Performed by: Personally  ?Anesthesiologist: Annye Asa, MD ? ?Additional Notes: ?Pt identified in Holding room.  Monitors applied. Working IV access confirmed. Sterile prep L thigh.  #21ga ECHOgenic Arrow block needle into adductor canal with US guidance.  20cc 0.75% Ropivacaine injected incrementally after negative test dose.  Patient asymptomatic, VSS, no heme aspirated, tolerated well.   Jenita Seashore, MD ? ? ? ? ?

## 2021-10-09 NOTE — Anesthesia Procedure Notes (Signed)
Procedure Name: [place] ?Date/Time: 10/09/2021 12:24 PM ?Performed by: Eben Burow, CRNA ?Pre-anesthesia Checklist: Patient identified, Emergency Drugs available, Suction available, Patient being monitored and Timeout performed ?Oxygen Delivery Method: Simple face mask ?Placement Confirmation: positive ETCO2 ? ? ? ? ?

## 2021-10-10 ENCOUNTER — Encounter (HOSPITAL_COMMUNITY): Payer: Self-pay | Admitting: Orthopedic Surgery

## 2021-10-10 LAB — CBC
HCT: 37.2 % — ABNORMAL LOW (ref 39.0–52.0)
Hemoglobin: 12.7 g/dL — ABNORMAL LOW (ref 13.0–17.0)
MCH: 31.1 pg (ref 26.0–34.0)
MCHC: 34.1 g/dL (ref 30.0–36.0)
MCV: 91.2 fL (ref 80.0–100.0)
Platelets: 192 10*3/uL (ref 150–400)
RBC: 4.08 MIL/uL — ABNORMAL LOW (ref 4.22–5.81)
RDW: 12.7 % (ref 11.5–15.5)
WBC: 8.3 10*3/uL (ref 4.0–10.5)
nRBC: 0 % (ref 0.0–0.2)

## 2021-10-10 LAB — BASIC METABOLIC PANEL
Anion gap: 8 (ref 5–15)
BUN: 28 mg/dL — ABNORMAL HIGH (ref 6–20)
CO2: 28 mmol/L (ref 22–32)
Calcium: 8.9 mg/dL (ref 8.9–10.3)
Chloride: 99 mmol/L (ref 98–111)
Creatinine, Ser: 1.19 mg/dL (ref 0.61–1.24)
GFR, Estimated: >60 mL/min (ref >60)
Glucose, Bld: 174 mg/dL — ABNORMAL HIGH (ref 70–99)
Potassium: 3.8 mmol/L (ref 3.5–5.1)
Sodium: 135 mmol/L (ref 135–145)

## 2021-10-10 LAB — GLUCOSE, CAPILLARY: Glucose-Capillary: 138 mg/dL — ABNORMAL HIGH (ref 70–99)

## 2021-10-10 MED ORDER — POLYETHYLENE GLYCOL 3350 17 G PO PACK: 17.0000 g | PACK | Freq: Every day | ORAL | 0 refills | Status: DC | PRN

## 2021-10-10 MED ORDER — METHOCARBAMOL 500 MG PO TABS: 500.0000 mg | ORAL_TABLET | Freq: Four times a day (QID) | ORAL | 0 refills | Status: DC | PRN

## 2021-10-10 MED ORDER — DOCUSATE SODIUM 100 MG PO CAPS: 100.0000 mg | ORAL_CAPSULE | Freq: Two times a day (BID) | ORAL | 0 refills | Status: DC

## 2021-10-10 MED ORDER — HYDROMORPHONE HCL 2 MG PO TABS: 2.0000 mg | ORAL_TABLET | ORAL | 0 refills | Status: DC | PRN

## 2021-10-10 MED ORDER — SENNA 8.6 MG PO TABS: 2.0000 | ORAL_TABLET | Freq: Every day | ORAL | 0 refills | Status: DC

## 2021-10-10 MED ORDER — PROCHLORPERAZINE MALEATE 5 MG PO TABS: 5.0000 mg | ORAL_TABLET | Freq: Four times a day (QID) | ORAL | 0 refills | Status: DC | PRN

## 2021-10-10 NOTE — Discharge Summary (Signed)
Physician Discharge Summary  Patient ID: Dylan [name] MRN: [id number] DOB/AGE: 01/25/1966 56 y.o.  Admit date: 10/09/2021 Discharge date: 10/10/2021  Admission Diagnoses:  Osteoarthritis of left knee  Discharge Diagnoses:  Principal Problem:   Osteoarthritis of left knee   Past Medical History:  Diagnosis Date   Arthritis    CAD (coronary artery disease) 07/2010; 07/2012; 08/2014   cardiologist--- dr croitoru;  1) 11-28-'11: NSTEMI - 100% RCA - PCI (3 Taxus ION DES 3.0 x 28, 3.0 x 24 & 3.5 x 12 distal-prox); b) 12/ 2013 ISR of RCA stent - -AnngioSculpt PTCA; c) NSTEMI 1/'16: mild ISR with Thrombosis of RCA Stents (in setting of Mesenteric Vein Thrombosis) - Cutting Balloon PTCA; c. Re-look Cath 07/2015 & 07/2016: ~15% ISR in RCA,20% p-mLAD & o-pCx.   Chronic gout    01-07-2021  last flare-up approxl 12-17-2020 bilteral great toes   DM type 2 (diabetes mellitus, type 2) ([place])    followed by pcp   (01-07-2021 per pt does not check blood sugar at home)   Edema of both lower extremities    01-07-2021  pt states wears compression hose   Essential hypertension    followed by pcp and cardiology   History of concussion    01-07-2021  per pt x4 as child, last one age 66 without residual   History of CVA with residual deficit 93/23/5573   embolic left MCA infarct post PCI stenting approx one hour  (01-07-2021 per pt has residual minimal intermittant aphasia )   History of non-ST elevation myocardial infarction (NSTEMI) 07-01-2010  and 01/ 2016   History of prostatitis    History of resection of small bowel 08/28/2014   small bowel perforation,  s/p resction    History of retinal tear 04/2013   right micro-rupture , no occlusion, retinal artery branch w/ vision loss,  per imaging no infarct/ occlusion   History of thrombosis 08/2014   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, provoked by testosterone injection,  08-09-2014 s/p thrombolytic lysis VIR    Hyperlipidemia  with target LDL less than 70 09/01/2014   May-Thurner syndrome    Myocardial infarction ([place]) 2011   and 2016   Nocturia    PAC (premature atrial contraction)    pt hx palpitations, event monitor 08-18-2017  showed NS w/ occasional PACs, rare PVCs   Peripheral vascular disease ([place])    Prostate cancer [facility]) urologist-- dr herrick/  oncologist-- dr Tammi Klippel   dx 09-04-2020,  Stage T1c, Gleason 4+3    S/P drug eluting coronary stent placement 07/01/2010   DES x3  to prox and distal RCA   S/P percutaneous transluminal coronary angioplasty 12/ 2013 and 01/ 2016   for ISR  RCA   Stroke [facility]) 2011   some residual aphasia    Surgeries: Procedure(s): COMPUTER ASSISTED TOTAL KNEE ARTHROPLASTY on 10/09/2021   Consultants (if any):   Discharged Condition: Improved  Hospital Course: Dylan [name] is an 56 y.o. male who was admitted 10/09/2021 with a diagnosis of Osteoarthritis of left knee and went to the operating room on 10/09/2021 and underwent the above named procedures.    He was given perioperative antibiotics:  Anti-infectives (From admission, onward)    Start     Dose/Rate Route Frequency Ordered Stop   10/09/21 1800  ceFAZolin (ANCEF) IVPB 2g/100 mL premix        2 g 200 mL/hr over 30 Minutes Intravenous Every 6 hours 10/09/21 1624 10/09/21 2359  10/09/21 0915  ceFAZolin (ANCEF) IVPB 2g/100 mL premix        2 g 200 mL/hr over 30 Minutes Intravenous On call to O.R. 10/09/21 0907 10/09/21 1239       He was given sequential compression devices, early ambulation, and xarelto for DVT prophylaxis.  He benefited maximally from the hospital stay and there were no complications.    Recent vital signs:  Vitals:   10/10/21 0544 10/10/21 0936  BP: (!) 155/89 (!) 159/91  Pulse: 66 65  Resp: 17 18  Temp: 98.2 F (36.8 C) 97.7 F (36.5 C)  SpO2: 98% 98%    Recent laboratory studies:  Lab Results  Component Value Date   HGB 12.7 (L) 10/10/2021   HGB 14.2 09/26/2021   HGB  15.3 08/19/2021   Lab Results  Component Value Date   WBC 8.3 10/10/2021   PLT 192 10/10/2021   Lab Results  Component Value Date   INR 1.1 04/26/2021   Lab Results  Component Value Date   NA 135 10/10/2021   K 3.8 10/10/2021   CL 99 10/10/2021   CO2 28 10/10/2021   BUN 28 (H) 10/10/2021   CREATININE 1.19 10/10/2021   GLUCOSE 174 (H) 10/10/2021     Allergies as of 10/10/2021       Reactions   Crestor [rosuvastatin] Nausea Only, Other (See Comments)   Severe muscle pain and nausea   Zofran [ondansetron Hcl] Nausea And Vomiting   Ace Inhibitors Cough   Lipitor [atorvastatin] Other (See Comments)   myalgia   Codeine Itching        Medication List     TAKE these medications    acetaminophen 325 MG tablet Commonly known as: TYLENOL Take 650 mg by mouth daily as needed (pain).   aspirin EC 81 MG tablet Take 81 mg by mouth every morning. Swallow whole.   carvedilol 12.5 MG tablet Commonly known as: COREG TAKE 1 TABLET(12.5 MG) BY MOUTH TWICE DAILY   docusate sodium 100 MG capsule Commonly known as: COLACE Take 1 capsule (100 mg total) by mouth 2 (two) times daily.   furosemide 40 MG tablet Commonly known as: LASIX TAKE 2 TABLETS BY MOUTH EVERY MORNING AND 1 TABLET IN THE EVENING What changed:  how much to take how to take this when to take this additional instructions   HYDROmorphone 2 MG tablet Commonly known as: DILAUDID Take 1 tablet (2 mg total) by mouth every 3 (three) hours as needed for severe pain.   methocarbamol 500 MG tablet Commonly known as: ROBAXIN Take 1 tablet (500 mg total) by mouth every 6 (six) hours as needed for muscle spasms.   nitroGLYCERIN 0.4 MG SL tablet Commonly known as: NITROSTAT Place 1 tablet (0.4 mg total) under the tongue every 5 (five) minutes x 3 doses as needed for chest pain.   olmesartan 40 MG tablet Commonly known as: BENICAR Take 1 tablet (40 mg total) by mouth daily. Patient must attend appointment for  future refills   polyethylene glycol 17 g packet Commonly known as: MIRALAX / GLYCOLAX Take 17 g by mouth daily as needed for mild constipation.   prochlorperazine 5 MG tablet Commonly known as: COMPAZINE Take 1 tablet (5 mg total) by mouth every 6 (six) hours as needed for nausea or vomiting.   Repatha SureClick 829 MG/ML Soaj Generic drug: Evolocumab Inject 140 mg into the skin every 14 (fourteen) days.   rivaroxaban 20 MG Tabs tablet Commonly known as: XARELTO Take  1 tablet (20 mg total) by mouth daily with supper.   senna 8.6 MG Tabs tablet Commonly known as: SENOKOT Take 2 tablets (17.2 mg total) by mouth at bedtime.   Synjardy XR 25-1000 MG Tb24 Generic drug: Empagliflozin-metFORMIN HCl ER Take 1 tablet by mouth every morning.   Systane Complete 0.6 % Soln Generic drug: Propylene Glycol Place 1 drop into both eyes 2 (two) times daily as needed (dry/irritated eyes.).          WEIGHT BEARING   Weight bearing as tolerated with assist device (walker, cane, etc) as directed, use it as long as suggested by your surgeon or therapist, typically at least 4-6 weeks.   EXERCISES  Results after joint replacement surgery are often greatly improved when you follow the exercise, range of motion and muscle strengthening exercises prescribed by your doctor. Safety measures are also important to protect the joint from further injury. Any time any of these exercises cause you to have increased pain or swelling, decrease what you are doing until you are comfortable again and then slowly increase them. If you have problems or questions, call your caregiver or physical therapist for advice.   Rehabilitation is important following a joint replacement. After just a few days of immobilization, the muscles of the leg can become weakened and shrink (atrophy).  These exercises are designed to build up the tone and strength of the thigh and leg muscles and to improve motion. Often times heat  used for twenty to thirty minutes before working out will loosen up your tissues and help with improving the range of motion but do not use heat for the first two weeks following surgery (sometimes heat can increase post-operative swelling).   These exercises can be done on a training (exercise) mat, on the floor, on a table or on a bed. Use whatever works the best and is most comfortable for you.    Use music or television while you are exercising so that the exercises are a pleasant break in your day. This will make your life better with the exercises acting as a break in your routine that you can look forward to.   Perform all exercises about fifteen times, three times per day or as directed.  You should exercise both the operative leg and the other leg as well.  Exercises include:   Quad Sets - Tighten up the muscle on the front of the thigh (Quad) and hold for 5-10 seconds.   Straight Leg Raises - With your knee straight (if you were given a brace, keep it on), lift the leg to 60 degrees, hold for 3 seconds, and slowly lower the leg.  Perform this exercise against resistance later as your leg gets stronger.  Leg Slides: Lying on your back, slowly slide your foot toward your buttocks, bending your knee up off the floor (only go as far as is comfortable). Then slowly slide your foot back down until your leg is flat on the floor again.  Angel Wings: Lying on your back spread your legs to the side as far apart as you can without causing discomfort.  Hamstring Strength:  Lying on your back, push your heel against the floor with your leg straight by tightening up the muscles of your buttocks.  Repeat, but this time bend your knee to a comfortable angle, and push your heel against the floor.  You may put a pillow under the heel to make it more comfortable if necessary.   A rehabilitation  program following joint replacement surgery can speed recovery and prevent re-injury in the future due to weakened  muscles. Contact your doctor or a physical therapist for more information on knee rehabilitation.    CONSTIPATION  Constipation is defined medically as fewer than three stools per week and severe constipation as less than one stool per week.  Even if you have a regular bowel pattern at home, your normal regimen is likely to be disrupted due to multiple reasons following surgery.  Combination of anesthesia, postoperative narcotics, change in appetite and fluid intake all can affect your bowels.   YOU MUST use at least one of the following options; they are listed in order of increasing strength to get the job done.  They are all available over the counter, and you may need to use some, POSSIBLY even all of these options:    Drink plenty of fluids (prune juice may be helpful) and high fiber foods Colace 100 mg by mouth twice a day  Senokot for constipation as directed and as needed Dulcolax (bisacodyl), take with full glass of water  Miralax (polyethylene glycol) once or twice a day as needed.  If you have tried all these things and are unable to have a bowel movement in the first 3-4 days after surgery call either your surgeon or your primary doctor.    If you experience loose stools or diarrhea, hold the medications until you stool forms back up.  If your symptoms do not get better within 1 week or if they get worse, check with your doctor.  If you experience "the worst abdominal pain ever" or develop nausea or vomiting, please contact the office immediately for further recommendations for treatment.   ITCHING:  If you experience itching with your medications, try taking only a single pain pill, or even half a pain pill at a time.  You can also use Benadryl over the counter for itching or also to help with sleep.   TED HOSE STOCKINGS:  Use stockings on both legs until for at least 2 weeks or as directed by physician office. They may be removed at night for sleeping.  MEDICATIONS:  See your  medication summary on the After Visit Summary that nursing will review with you.  You may have some home medications which will be placed on hold until you complete the course of blood thinner medication.  It is important for you to complete the blood thinner medication as prescribed.  PRECAUTIONS:  If you experience chest pain or shortness of breath - call 911 immediately for transfer to the hospital emergency department.   If you develop a fever greater that 101 F, purulent drainage from wound, increased redness or drainage from wound, foul odor from the wound/dressing, or calf pain - CONTACT YOUR SURGEON.                                                   FOLLOW-UP APPOINTMENTS:  If you do not already have a post-op appointment, please call the office for an appointment to be seen by your surgeon.  Guidelines for how soon to be seen are listed in your After Visit Summary, but are typically between 1-4 weeks after surgery.  OTHER INSTRUCTIONS:   Knee Replacement:  Do not place pillow under knee, focus on keeping the knee straight while resting. CPM instructions:  0-90 degrees, 2 hours in the morning, 2 hours in the afternoon, and 2 hours in the evening. Place foam block, curve side up under heel at all times except when in CPM or when walking.  DO NOT modify, tear, cut, or change the foam block in any way.   MAKE SURE YOU:  Understand these instructions.  Get help right away if you are not doing well or get worse.    Thank you for letting us be a part of your medical care team.  It is a privilege we respect greatly.  We hope these instructions will help you stay on track for a fast and full recovery!   Diagnostic Studies: VAS [place] IVC/ILIAC (VENOUS ONLY)  Result Date: 09/25/2021 IVC/ILIAC STUDY Patient Name:  GIOVONNIE TRETTEL [facility]  Date of Exam:   09/25/2021 Medical Rec #: [id number]           Accession #:    [id number] Date of Birth: 12-Apr-1966           Patient Gender: M Patient Age:   13 years  Exam Location:  [facility] Procedure:      VAS [place] IVC/ILIAC (VENOUS ONLY) Referring Phys: Servando Snare --------------------------------------------------------------------------------  Indications: Surgery date 08/19/2021. Patient reports significant improvement in              his left leg Other Factors: Hernia repair. Vascular Interventions: 08/19/2021                         Pre-operative Diagnosis: May thurner syndrome, dvt                         Post-operative diagnosis: Same                         Surgeon: Eda Paschal. Donzetta Matters, MD                         Procedure Performed:                         1. Ultrasound-guided cannulation left common femoral                         vein                         2. IVC filter placement with fluoroscopic guidance                         3. Stent of left common iliac vein with 16 x 60 mm Abre                         4. Left external and common iliac veins and IVC venogram                         5. Intravascular ultrasound left external and common                         iliac veins and IVC                         6. Moderate sedation with fentanyl and  Versed for 49. Limitations: Air/bowel gas and obesity.  Performing Technologist: Ronal Fear RVS, RCS  Examination Guidelines: A complete evaluation includes B-mode imaging, spectral Doppler, color Doppler, and power Doppler as needed of all accessible portions of each vessel. Bilateral testing is considered an integral part of a complete examination. Limited examinations for reoccurring indications may be performed as noted.  IVC/Iliac Findings: +----------+------+--------+--------+     IVC     Patent Thrombus Comments  +----------+------+--------+--------+  IVC Mid    patent                    +----------+------+--------+--------+  IVC Distal patent                    +----------+------+--------+--------+  +-------------------+---------+-----------+---------+-----------+--------+          CIV          RT-Patent RT-Thrombus LT-Patent LT-Thrombus Comments  +-------------------+---------+-----------+---------+-----------+--------+  Common Iliac Prox                          patent                         +-------------------+---------+-----------+---------+-----------+--------+  Common Iliac Mid                           patent                         +-------------------+---------+-----------+---------+-----------+--------+  Common Iliac Distal                        patent                         +-------------------+---------+-----------+---------+-----------+--------+  +-------------------------+---------+-----------+---------+-----------+--------+             EIV            RT-Patent RT-Thrombus LT-Patent LT-Thrombus Comments  +-------------------------+---------+-----------+---------+-----------+--------+  External Iliac Vein Prox                         patent                         +-------------------------+---------+-----------+---------+-----------+--------+  External Iliac Vein Mid                          patent                         +-------------------------+---------+-----------+---------+-----------+--------+  External Iliac Vein                              patent                          Distal                                                                          +-------------------------+---------+-----------+---------+-----------+--------+  Summary: IVC/Iliac: Visualization of proximal Inferior Vena Cava was limited. Sub-optimal visualization. Patent IVC, common iliac vein stent and external iliac vein where visualized.  *See table(s) above for measurements and observations.  Electronically signed by Servando Snare MD on 09/25/2021 at 9:34:15 AM.    Final    DG Knee Left Port  Result Date: 10/09/2021 CLINICAL DATA:  Knee replacement EXAM: PORTABLE LEFT KNEE - 1-2 VIEW COMPARISON:  None. FINDINGS: Status post left knee replacement with intact hardware and normal alignment. No fracture.  Gas in the soft tissues consistent with recent surgery. IMPRESSION: Status post left knee replacement with expected surgical changes Electronically Signed   By: Donavan Foil M.D.   On: 10/09/2021 15:44    Disposition: Discharge disposition: 01-Home or Self Care       Discharge Instructions     Call MD / Call 911   Complete by: As directed    If you experience chest pain or shortness of breath, CALL 911 and be transported to the hospital emergency room.  If you develope a fever above 101 F, pus (white drainage) or increased drainage or redness at the wound, or calf pain, call your surgeon's office.   Constipation Prevention   Complete by: As directed    Drink plenty of fluids.  Prune juice may be helpful.  You may use a stool softener, such as Colace (over the counter) 100 mg twice a day.  Use MiraLax (over the counter) for constipation as needed.   Diet - low sodium heart healthy   Complete by: As directed    Do not put a pillow under the knee. Place it under the heel.   Complete by: As directed    Driving restrictions   Complete by: As directed    No driving for 6 weeks   Increase activity slowly as tolerated   Complete by: As directed    Lifting restrictions   Complete by: As directed    No lifting for 6 weeks   Post-operative opioid taper instructions:   Complete by: As directed    POST-OPERATIVE OPIOID TAPER INSTRUCTIONS: It is important to wean off of your opioid medication as soon as possible. If you do not need pain medication after your surgery it is ok to stop day one. Opioids include: Codeine, Hydrocodone(Norco, Vicodin), Oxycodone(Percocet, oxycontin) and hydromorphone amongst others.  Long term and even short term use of opiods can cause: Increased pain response Dependence Constipation Depression Respiratory depression And more.  Withdrawal symptoms can include Flu like symptoms Nausea, vomiting And more Techniques to manage these symptoms Hydrate well Eat  regular healthy meals Stay active Use relaxation techniques(deep breathing, meditating, yoga) Do Not substitute Alcohol to help with tapering If you have been on opioids for less than two weeks and do not have pain than it is ok to stop all together.  Plan to wean off of opioids This plan should start within one week post op of your joint replacement. Maintain the same interval or time between taking each dose and first decrease the dose.  Cut the total daily intake of opioids by one tablet each day Next start to increase the time between doses. The last dose that should be eliminated is the evening dose.      TED hose   Complete by: As directed    Use stockings (TED hose) for 2 weeks on both leg(s).  You may remove them at night for sleeping.        Follow-up Information  Juliya Magill, Aaron Edelman, MD. Schedule an appointment as soon as possible for a visit in 2 week(s).   Specialty: Orthopedic Surgery Why: For wound re-check Contact information: [address] [place] [address] [phone]                  Signed: Hilton Cork Kwabena Strutz 10/10/2021, 10:49 AM

## 2021-10-10 NOTE — Progress Notes (Signed)
? ? ?  Subjective: ? ?Patient reports pain as mild to moderate.  Denies N/V/CP/SOB. No c/o. ? ?Objective:  ? ?VITALS:   ?Vitals:  ? 10/09/21 1632 10/09/21 2106 10/10/21 0146 10/10/21 0544  ?BP: 131/81 (!) 155/83 (!) 159/94 (!) 155/89  ?Pulse: (!) 55 73 72 66  ?Resp:  17 18 17  ?Temp: (!) 97.5 ?F (36.4 ?C) 98.1 ?F (36.7 ?C) 97.9 ?F (36.6 ?C) 98.2 ?F (36.8 ?C)  ?TempSrc: Oral     ?SpO2: 94% 98% 98% 98%  ?Weight:      ?Height:      ? ? ?NAD ?ABD soft ?Sensation intact distally ?Intact pulses distally ?Dorsiflexion/Plantar flexion intact ?Incision: dressing C/D/I ?Compartment soft ? ? ?Lab Results  ?Component Value Date  ? WBC 8.3 10/10/2021  ? HGB 12.7 (L) 10/10/2021  ? HCT 37.2 (L) 10/10/2021  ? MCV 91.2 10/10/2021  ? PLT 192 10/10/2021  ? ?BMET ?   ?Component Value Date/Time  ? NA 135 10/10/2021 0315  ? NA 140 09/28/2014 1438  ? K 3.8 10/10/2021 0315  ? K 4.3 09/28/2014 1438  ? CL 99 10/10/2021 0315  ? CO2 28 10/10/2021 0315  ? CO2 25 09/28/2014 1438  ? GLUCOSE 174 (H) 10/10/2021 0315  ? GLUCOSE 134 09/28/2014 1438  ? BUN 28 (H) 10/10/2021 0315  ? BUN 27.4 (H) 10/04/2014 1445  ? CREATININE 1.19 10/10/2021 0315  ? CREATININE 1.14 05/29/2016 1157  ? CREATININE 1.6 (H) 10/04/2014 1445  ? CALCIUM 8.9 10/10/2021 0315  ? CALCIUM 9.5 09/28/2014 1438  ? EGFR 51 (L) 10/04/2014 1445  ? GFRNONAA >60 10/10/2021 0315  ? ? ? ?Assessment/Plan: ?1 Day Post-Op  ? ?Principal Problem: ?  Osteoarthritis of left knee ? ? ?WBAT with walker ?DVT ppx: Xarelto, SCDs, TEDS ?PO pain control ?PT/OT ?Dispo: D/C home with OPPT ? ? ?Brian J Swinteck ?10/10/2021, 7:35 AM ? ? ?Brian Swinteck, MD ?[phone] ?[facility] is now [facility]  [place] ?[address], [place], [place] [address] ?Phone: [phone] ?[email or web address] ?Facebook  Instagram  LinkedIn  Twitter  ?  ? ? ?

## 2021-10-10 NOTE — Plan of Care (Signed)
  Problem: Education: Goal: Knowledge of General Education information will improve Description: Including pain rating scale, medication(s)/side effects and non-pharmacologic comfort measures Outcome: Progressing   Problem: Activity: Goal: Risk for activity intolerance will decrease Outcome: Progressing   Problem: Pain Managment: Goal: General experience of comfort will improve Outcome: Progressing   

## 2021-10-10 NOTE — Progress Notes (Signed)
**Note Dylan-Identified via Obfuscation** Physical Therapy Treatment ?Patient Details ?Name: Dylan [name] ?MRN: [id number] ?DOB: 07-03-1966 ?Today's Date: 10/10/2021 ? ? ?History of Present Illness Pt is 56 yo male s/p L TKA on 10/09/21.  Pt with hx including but not limited to DVTs, DM2, CAD, vision loss R eye, CVA, NSTEMI, arthritis ? ?  ?PT Comments  ? ? POD # 1 am session ?Assisted with amb in hallway.  General Gait Details: tolerated a functional distance with good safety cognition.  Then returned to room to perform some TE's following HEP handout.  Instructed on proper tech, freq as well as use of ICE.   ?Addressed all mobility questions, discussed appropriate activity, educated on use of ICE.  Pt ready for D/C to home. ?  ?Recommendations for follow up therapy are one component of a multi-disciplinary discharge planning process, led by the attending physician.  Recommendations may be updated based on patient status, additional functional criteria and insurance authorization. ? ?Follow Up Recommendations ? Outpatient PT ?  ?  ?Assistance Recommended at Discharge Intermittent Supervision/Assistance  ?Patient can return home with the following Assistance with cooking/housework;Help with stairs or ramp for entrance;Two people to help with walking and/or transfers;Two people to help with bathing/dressing/bathroom ?  ?Equipment Recommendations ? None recommended by PT  ?  ?Recommendations for Other Services   ? ? ?  ?Precautions / Restrictions Precautions ?Precautions: Fall ?Precaution Comments: instructed no pillow under knee ?Restrictions ?Weight Bearing Restrictions: No ?LLE Weight Bearing: Weight bearing as tolerated  ?  ? ?Mobility ? Bed Mobility ?Overal bed mobility: Needs Assistance ?Bed Mobility: Supine to Sit ?  ?  ?Supine to sit: Supervision ?  ?  ?General bed mobility comments: self able to perform SLR ?  ? ?Transfers ?Overall transfer level: Modified independent ?Equipment used: Rolling walker (2 wheels) ?Transfers: Sit to/from Stand ?Sit to  Stand: Modified independent (Device/Increase time) ?  ?  ?  ?  ?  ?General transfer comment: good safety cognition and use of hands to steady self ?  ? ?Ambulation/Gait ?Ambulation/Gait assistance: Modified independent (Device/Increase time) ?Gait Distance (Feet): 85 Feet ?Assistive device: Rolling walker (2 wheels) ?Gait Pattern/deviations: Step-through pattern ?Gait velocity: WNL ?  ?  ?General Gait Details: tolerated a functional distance with good safety cognition ? ? ?Stairs ?  ?  ?  ?  ?  ? ? ?Wheelchair Mobility ?  ? ?Modified Rankin (Stroke Patients Only) ?  ? ? ?  ?Balance   ?  ?  ?  ?  ?  ?  ?  ?  ?  ?  ?  ?  ?  ?  ?  ?  ?  ?  ?  ? ?  ?Cognition Arousal/Alertness: Awake/alert ?Behavior During Therapy: [facility] for tasks assessed/performed ?Overall Cognitive Status: Within Functional Limits for tasks assessed ?  ?  ?  ?  ?  ?  ?  ?  ?  ?  ?  ?  ?  ?  ?  ?  ?General Comments: AxO x 3 very motivated ?  ?  ? ?  ?Exercises  Total Knee Replacement TE's following HEP handout ?10 reps B LE ankle pumps ?05 reps towel squeezes ?05 reps knee presses ?05 reps heel slides  ?05 reps SAQ's ?05 reps SLR's ?05 reps ABD ?Educated on use of gait belt to assist with TE's ?Followed by ICE ? ? ?  ?General Comments   ?  ?  ? ?Pertinent Vitals/Pain Pain Assessment ?Pain Assessment: 0-10 ?Pain Score: 3  ?  Pain Location: L knee ?Pain Descriptors / Indicators: Aching, Operative site guarding ?Pain Intervention(s): Monitored during session, Premedicated before session, Repositioned, Ice applied  ? ? ?Home Living   ?  ?  ?  ?  ?  ?  ?  ?  ?  ?   ?  ?Prior Function    ?  ?  ?   ? ?PT Goals (current goals can now be found in the care plan section) Progress towards PT goals: Progressing toward goals ? ?  ?Frequency ? ? ? 7X/week ? ? ? ?  ?PT Plan Current plan remains appropriate  ? ? ?Co-evaluation   ?  ?  ?  ?  ? ?  ?AM-PAC PT "6 Clicks" Mobility   ?Outcome Measure ? Help needed turning from your back to your side while in a flat bed without  using bedrails?: None ?Help needed moving from lying on your back to sitting on the side of a flat bed without using bedrails?: None ?Help needed moving to and from a bed to a chair (including a wheelchair)?: None ?Help needed standing up from a chair using your arms (e.g., wheelchair or bedside chair)?: None ?Help needed to walk in hospital room?: A Little ?Help needed climbing 3-5 steps with a railing? : A Little ?6 Click Score: 22 ? ?  ?End of Session Equipment Utilized During Treatment: Gait belt ?Activity Tolerance: Patient tolerated treatment well ?Patient left: with call bell/phone within reach ?Nurse Communication: Mobility status ?PT Visit Diagnosis: Other abnormalities of gait and mobility (R26.89);Muscle weakness (generalized) (M62.81) ?  ? ? ?Time: 8527-7824 ?PT Time Calculation (min) (ACUTE ONLY): 32 min ? ?Charges:  $Gait Training: 8-22 mins ?$Therapeutic Exercise: 8-22 mins          ?          ?Rica Koyanagi  PTA ?Acute  Rehabilitation Services ?Pager      [phone] ?Office      [phone] ? ?

## 2021-10-10 NOTE — TOC Transition Note (Signed)
Transition of Care (TOC) - CM/SW Discharge Note ? ? ?Patient Details  ?Name: MARKEVIOUS EHMKE ?MRN: [id number] ?Date of Birth: [birth year] ? ?Transition of Care (TOC) CM/SW Contact:  ?Peterson Mathey, LCSW ?Phone Number: ?10/10/2021, 9:24 AM ? ? ?Clinical Narrative:    ?Met briefly with pt and confirming he has all needed DME at home.  Plan for OPPT at [facility]).  No TOC needs. ? ? ?Final next level of care: OP Rehab ?Barriers to Discharge: No Barriers Identified ? ? ?Patient Goals and CMS Choice ?Patient states their goals for this hospitalization and ongoing recovery are:: return home ?  ?  ? ?Discharge Placement ?  ?           ?  ?  ?  ?  ? ?Discharge Plan and Services ?  ?  ?           ?DME Arranged: N/A ?DME Agency: NA ?  ?  ?  ?  ?  ?  ?  ?  ? ?Social Determinants of Health (SDOH) Interventions ?  ? ? ?Readmission Risk Interventions ?No flowsheet data found. ? ? ? ? ?

## 2021-10-11 ENCOUNTER — Other Ambulatory Visit: Payer: Self-pay | Admitting: Cardiovascular Disease

## 2021-10-11 ENCOUNTER — Encounter: Payer: Self-pay | Admitting: Cardiovascular Disease

## 2021-10-11 MED ORDER — FUROSEMIDE 40 MG PO TABS: 40.0000 mg | ORAL_TABLET | ORAL | 0 refills | Status: DC

## 2021-10-11 NOTE — Telephone Encounter (Signed)
Please go ahead and refill until his next appointment ?

## 2021-10-17 ENCOUNTER — Other Ambulatory Visit: Payer: Self-pay | Admitting: Cardiovascular Disease

## 2021-10-24 ENCOUNTER — Encounter: Payer: Self-pay | Admitting: Pulmonary Disease

## 2021-11-16 ENCOUNTER — Other Ambulatory Visit: Payer: Self-pay | Admitting: Cardiovascular Disease

## 2021-11-18 ENCOUNTER — Encounter: Payer: Self-pay | Admitting: Cardiovascular Disease

## 2021-11-18 MED ORDER — FUROSEMIDE 40 MG PO TABS: ORAL_TABLET | ORAL | 0 refills | Status: DC

## 2021-12-11 ENCOUNTER — Ambulatory Visit: Payer: 59 | Admitting: Oncology

## 2021-12-12 LAB — EXTERNAL GENERIC LAB PROCEDURE: COLOGUARD: NEGATIVE

## 2021-12-12 LAB — COLOGUARD: COLOGUARD: NEGATIVE

## 2021-12-13 ENCOUNTER — Other Ambulatory Visit: Payer: Self-pay | Admitting: Medical

## 2021-12-13 DIAGNOSIS — E785 Hyperlipidemia, unspecified: Secondary | ICD-10-CM

## 2021-12-13 DIAGNOSIS — I2510 Atherosclerotic heart disease of native coronary artery without angina pectoris: Secondary | ICD-10-CM

## 2022-01-30 ENCOUNTER — Encounter: Payer: Self-pay | Admitting: Cardiovascular Disease

## 2022-01-30 ENCOUNTER — Ambulatory Visit (INDEPENDENT_AMBULATORY_CARE_PROVIDER_SITE_OTHER): Payer: 59 | Admitting: Cardiovascular Disease

## 2022-01-30 VITALS — BP 120/82 | HR 71 | Ht 73.0 in | Wt 263.6 lb

## 2022-01-30 DIAGNOSIS — E7800 Pure hypercholesterolemia, unspecified: Secondary | ICD-10-CM | POA: Diagnosis not present

## 2022-01-30 DIAGNOSIS — I10 Essential (primary) hypertension: Secondary | ICD-10-CM

## 2022-01-30 DIAGNOSIS — I2510 Atherosclerotic heart disease of native coronary artery without angina pectoris: Secondary | ICD-10-CM

## 2022-01-30 DIAGNOSIS — R002 Palpitations: Secondary | ICD-10-CM

## 2022-01-30 DIAGNOSIS — Z86718 Personal history of other venous thrombosis and embolism: Secondary | ICD-10-CM

## 2022-01-30 DIAGNOSIS — E119 Type 2 diabetes mellitus without complications: Secondary | ICD-10-CM

## 2022-01-30 NOTE — Progress Notes (Signed)
Cardiology Office Note:    Date:  02/04/2022   ID:  Dylan [name], DOB 1966-02-22, MRN [id number]  PCP:  [name], PA-C  Cardiologist:  [name], MD   Referring MD: [name], PA-C   Chief Complaint  Patient presents with   Coronary Artery Disease    History of Present Illness:    Dylan [name] is a 56 y.o. male with a hx of premature onset CAD, HTN, hypercholesterolemia with statin myopathy, type II DM, history of gout, remote stroke without sequelae (following cardiac catheterization 2013), prostate cancer status post brachytherapy and external XRT returning for routine follow-up.   He had extensive stenting of the right coronary artery in 2011 and then required angioplasty for restenosis in 2013 and thrombosis in January 2016  (during treatment with androgen supplements he presented with mesenteric vein thrombosis and had a non-STEMI at the same time). In December 2016 and again in December 2017, his cardiac catheterization showed no evidence of restenosis disease progression.  He has preserved left ventricular systolic function.  Low risk no stress test October 2017 and normal nuclear stress test November 2018.  He is doing great from a cardiovascular point of view and denies any issues with chest pain or shortness of breath at rest or with activity, edema, palpitations, dizziness, syncope or new focal neurological complaints.  On the other hand he had a lot of other health issues in 2022 in early 2023.  His PSA was markedly elevated and he underwent both radioactive seed implants and IMRT (urology Dr. Louis Meckel).  Following COVID-19 infection, in September 2022 he developed extensive deep venous thrombosis of the left lower extremity from the thigh to the foot (he had previous extensive DVT from the superior mesenteric vein to the portal vein during treatment with injectable androgens in 2016, requiring thrombolysis).  He underwent percutaneous thrombectomy and  placement of a left common iliac venous stent as well as an IVC filter after being diagnosed with May Thurner syndrome (Dr. Servando Snare).  He had total left knee replacement in early March 2023 but once he recovered from that has stopped anticoagulants.  He is still taking aspirin 81 mg daily.  Following hematology consultation he underwent extensive work-up for hypercoagulable states.  He did not have evidence of anticardiolipin antibodies although lupus anticoagulant was positive.  Protein C, protein S and Antithrombin activity was normal and beta-2 glycoprotein was low.  Genetic testing for factor V Leiden and prothrombin gene mutation was negative.  He has a history of statin myopathy (with numerous agents) but has had an excellent response to Repatha.  Most recent LDL was 32 in November 2022.  Dylan [name] is asymptomatic from a cardiac point of view and has not required any revascularization procedure since January 2016. He denies problems with angina at rest or with activity, dyspnea with activity, dizziness, syncope, palpitations, leg edema, claudication or focal neurological problems.  He is now taking empagliflozin for diabetes.  He continues to take daily diuretic and has had an excellent lipid profile response to Repatha, after being intolerant to numerous statins.  He is occasional troubled by palpitations and his Holter monitor showed that these were due to PACs, without any complex atrial or ventricular arrhythmia.  Atrial fibrillation has never been detected.  Past Medical History:  Diagnosis Date   Arthritis    CAD (coronary artery disease) 07/2010; 07/2012; 08/2014   cardiologist--- dr Shearon Clonch;  1) 11-28-'11: NSTEMI - 100% RCA - PCI (3 Taxus  ION DES 3.0 x 28, 3.0 x 24 & 3.5 x 12 distal-prox); b) 12/ 2013 ISR of RCA stent - -AnngioSculpt PTCA; c) NSTEMI 1/'16: mild ISR with Thrombosis of RCA Stents (in setting of Mesenteric Vein Thrombosis) - Cutting Balloon PTCA; c. Re-look Cath  07/2015 & 07/2016: ~15% ISR in RCA,20% p-mLAD & o-pCx.   Chronic gout    01-07-2021  last flare-up approxl 12-17-2020 bilteral great toes   DM type 2 (diabetes mellitus, type 2) ([place])    followed by pcp   (01-07-2021 per pt does not check blood sugar at home)   Edema of both lower extremities    01-07-2021  pt states wears compression hose   Essential hypertension    followed by pcp and cardiology   History of concussion    01-07-2021  per pt x4 as child, last one age 39 without residual   History of CVA with residual deficit 76/16/0737   embolic left MCA infarct post PCI stenting approx one hour  (01-07-2021 per pt has residual minimal intermittant aphasia )   History of non-ST elevation myocardial infarction (NSTEMI) 07-01-2010  and 01/ 2016   History of prostatitis    History of resection of small bowel 08/28/2014   small bowel perforation,  s/p resction    History of retinal tear 04/2013   right micro-rupture , no occlusion, retinal artery branch w/ vision loss,  per imaging no infarct/ occlusion   History of thrombosis 08/2014   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, provoked by testosterone injection,  08-09-2014 s/p thrombolytic lysis VIR    Hyperlipidemia with target LDL less than 70 09/01/2014   May-Thurner syndrome    Myocardial infarction ([place]) 2011   and 2016   Nocturia    PAC (premature atrial contraction)    pt hx palpitations, event monitor 08-18-2017  showed NS w/ occasional PACs, rare PVCs   Peripheral vascular disease ([place])    Prostate cancer [facility]) urologist-- dr herrick/  oncologist-- dr Tammi Klippel   dx 09-04-2020,  Stage T1c, Gleason 4+3    S/P drug eluting coronary stent placement 07/01/2010   DES x3  to prox and distal RCA   S/P percutaneous transluminal coronary angioplasty 12/ 2013 and 01/ 2016   for ISR  RCA   Stroke ([place]) 2011   some residual aphasia    Past Surgical History:  Procedure Laterality Date   BOWEL RESECTION N/A  08/28/2014   Procedure: SMALL BOWEL RESECTION;  Surgeon: [name], MD;  Location: [place];  Service: General;  Laterality: N/A;   CARDIAC CATHETERIZATION N/A 07/20/2015   Procedure: Left Heart Cath and Coronary Angiography;  Surgeon: [name], MD;  Location: [place] [facility];  Service: Cardiovascular;  15% ISR in the RCA stent segment, 20% proximal-mid LAD as well as ostial and proximal circumflex. Normal LV function.   CARDIAC CATHETERIZATION N/A 07/23/2016   Procedure: Left Heart Cath and Coronary Angiography;  Surgeon: [name], MD;  Location: [place] [facility];  Service: Cardiovascular: E 55-65%. ~15% diffuse RCA ISR, ~20% diffuse pLAD & pCx   CORONARY ANGIOPLASTY  07/06/2012; 08/15/2014   a) 12/'13: PTCA of 80% ISR mRCA - AngioSculpt; b) PTCA of  ISR/thrombosis   CORONARY ANGIOPLASTY WITH STENT PLACEMENT  07-01-2010  dr harding '@[facility]'$    inferior wall MI - 3 Taxus Ion DES (3.0x57m, 3.0x246m 3.5x1239mto prox and mid RCA   CYSTOSCOPY N/A 01/10/2021   Procedure: CYSTOSCOPY FLEXIBLE;  Surgeon: HerLouis Meckel  W, MD;  Location: [facility];  Service: Urology;  Laterality: N/A;   INCISIONAL HERNIA REPAIR N/A 10/15/2017   Procedure: HERNIA REPAIR INCISIONAL;  Surgeon: [name], MD;  Location: [facility];  Service: General;  Laterality: N/A;   INGUINAL HERNIA REPAIR Bilateral 05/2010   INSERTION OF MESH N/A 10/15/2017   Procedure: INSERTION OF MESH;  Surgeon: [name], MD;  Location: [facility];  Service: General;  Laterality: N/A;   INTRAVASCULAR ULTRASOUND/IVUS Left 04/29/2021   Procedure: Intravascular Ultrasound/IVUS;  Surgeon: Waynetta Sandy, MD;  Location: [place] [facility];  Service: Cardiovascular;  Laterality: Left;   IVC FILTER INSERTION N/A 08/19/2021   Procedure: IVC FILTER INSERTION;  Surgeon: Waynetta Sandy, MD;  Location: [place] [facility];  Service: Cardiovascular;  Laterality: N/A;   KNEE ARTHROPLASTY Left 10/09/2021    Procedure: COMPUTER ASSISTED TOTAL KNEE ARTHROPLASTY;  Surgeon: Rod Can, MD;  Location: [facility];  Service: Orthopedics;  Laterality: Left;  150   KNEE ARTHROSCOPY W/ MENISCAL REPAIR Left 06/2011   LAPAROSCOPIC APPENDECTOMY N/A 08/28/2014   Procedure: APPENDECTOMY LAPAROSCOPIC;  Surgeon: [name], MD;  Location: [place];  Service: General;  Laterality: N/A;   LAPAROSCOPY  08/28/2014   Procedure: LAPAROSCOPY DIAGNOSTIC;  Surgeon: [name], MD;  Location: [place];  Service: General;;   LEFT HEART CATHETERIZATION WITH CORONARY ANGIOGRAM N/A 06/11/2012   Procedure: LEFT HEART CATHETERIZATION WITH CORONARY ANGIOGRAM;  Surgeon: [name], MD;  Location: [place] [facility];  Service: Cardiovascular;  Laterality: N/A;   LEFT HEART CATHETERIZATION WITH CORONARY ANGIOGRAM N/A 04/18/2013   Procedure: LEFT HEART CATHETERIZATION WITH CORONARY ANGIOGRAM;  Surgeon: [name], MD;  [facility];  Service: Cardiovascular;  Laterality: N/A;   LEFT HEART CATHETERIZATION WITH CORONARY ANGIOGRAM N/A 08/15/2014   Procedure: LEFT HEART CATHETERIZATION WITH CORONARY ANGIOGRAM;  Surgeon: [name], MD;  Location: [facility];  Service: Cardiovascular;  Laterality: N/A;   LOWER EXTREMITY VENOGRAPHY Left 04/29/2021   Procedure: LOWER EXTREMITY VENOGRAPHY;  Surgeon: Waynetta Sandy, MD;  Location: [place] [facility];  Service: Cardiovascular;  Laterality: Left;   LOWER EXTREMITY VENOGRAPHY Left 08/19/2021   Procedure: LOWER EXTREMITY VENOGRAPHY;  Surgeon: Waynetta Sandy, MD;  Location: [place] [facility];  Service: Cardiovascular;  Laterality: Left;   PERCUTANEOUS CORONARY STENT INTERVENTION (PCI-S) N/A 07/06/2012   Procedure: PERCUTANEOUS CORONARY STENT INTERVENTION (PCI-S);  Surgeon: [name], MD;  Location[facility];  Service: Cardiovascular;  Laterality: N/A;   PERIPHERAL VASCULAR INTERVENTION Left 08/19/2021   Procedure: PERIPHERAL VASCULAR INTERVENTION;   Surgeon: Waynetta Sandy, MD;  Location: [place] [facility];  Service: Cardiovascular;  Laterality: Left;  Common Iliac Venous   PERIPHERAL VASCULAR THROMBECTOMY Left 04/29/2021   Procedure: PERIPHERAL VASCULAR THROMBECTOMY;  Surgeon: Waynetta Sandy, MD;  Location: [place] [facility];  Service: Cardiovascular;  Laterality: Left;   RADIOACTIVE SEED IMPLANT N/A 01/10/2021   Procedure: RADIOACTIVE SEED IMPLANT/BRACHYTHERAPY IMPLANT;  Surgeon: Ardis Hughs, MD;  Location: [facility];  Service: Urology;  Laterality: N/A;   SEPTOPLASTY  11-01-2001 @ [place]   SPACE OAR INSTILLATION N/A 01/10/2021   Procedure: SPACE OAR INSTILLATION;  Surgeon: Ardis Hughs, MD;  Location: [facility];  Service: Urology;  Laterality: N/A;   TONSILLECTOMY AND ADENOIDECTOMY  age 35   TRANSTHORACIC ECHOCARDIOGRAM  08/15/2014   Moderate concentric LVH. EF 60-65% with no regional WMA. Gr 1 DD, otherwise normal    Current  Medications: Current Meds  Medication Sig   acetaminophen (TYLENOL) 325 MG tablet Take 650 mg by mouth daily as needed (pain).   aspirin EC 81 MG tablet Take 81 mg by mouth every morning. Swallow whole.   carvedilol (COREG) 12.5 MG tablet TAKE 1 TABLET(12.5 MG) BY MOUTH TWICE DAILY   Empagliflozin-metFORMIN HCl ER (SYNJARDY XR) 25-1000 MG TB24 Take 1 tablet by mouth every morning.   Evolocumab (REPATHA SURECLICK) 774 MG/ML SOAJ ADMINISTER 1 ML UNDER THE SKIN EVERY 14 DAYS   furosemide (LASIX) 40 MG tablet TAKE 1 TABLET BY MOUTH EVERY MORNING AND 2 TABLETS EVERY EVENING   olmesartan (BENICAR) 40 MG tablet Take 1 tablet (40 mg total) by mouth daily. PATIENT MUST KEEP APPOINTMENT FOR FUTURE REFILLS     Allergies:   Crestor [rosuvastatin], Zofran [ondansetron hcl], Ace inhibitors, Lipitor [atorvastatin], and Codeine   Social History   Socioeconomic History   Marital status: Married    Spouse name: Curt Bears   Number of children: 4   Years of  education: 12   Highest education level: Not on file  Occupational History   Occupation: Pharmacologist: [employer]  Tobacco Use   Smoking status: Former    Packs/day: 1.00    Years: 15.00    Total pack years: 15.00    Types: Cigarettes    Quit date: 06/30/2010    Years since quitting: 11.6   Smokeless tobacco: Current    Types: Snuff  Vaping Use   Vaping Use: Never used  Substance and Sexual Activity   Alcohol use: Yes    Alcohol/week: 7.0 - 14.0 standard drinks of alcohol    Types: 7 - 14 Shots of liquor per week    Comment: 1-2 bourbon on the rocks per day   Drug use: No   Sexual activity: Yes    Birth control/protection: Surgical    Comment: vasectomy  Other Topics Concern   Not on file  Social History Narrative   Not on file   Social Determinants of Health   Financial Resource Strain: Low Risk  (06/13/2021)   Overall Financial Resource Strain (CARDIA)    Difficulty of Paying Living Expenses: Not hard at all  Food Insecurity: No Food Insecurity (06/13/2021)   Hunger Vital Sign    Worried About Running Out of Food in the Last Year: Never true    Ran Out of Food in the Last Year: Never true  Transportation Needs: No Transportation Needs (06/13/2021)   PRAPARE - Hydrologist (Medical): No    Lack of Transportation (Non-Medical): No  Physical Activity: Insufficiently Active (06/13/2021)   Exercise Vital Sign    Days of Exercise per Week: 2 days    Minutes of Exercise per Session: 30 min  Stress: No Stress Concern Present (06/13/2021)   [place]    Feeling of Stress : Not at all  Social Connections: Moderately Isolated (06/13/2021)   Social Connection and Isolation Panel [NHANES]    Frequency of Communication with Friends and Family: More than three times a week    Frequency of Social Gatherings with Friends and Family: More than three times a week    Attends  Religious Services: Never    Marine scientist or Organizations: No    Attends Archivist Meetings: Never    Marital Status: Married     Family History: The patient's family history includes Atrial fibrillation in  his mother; Coronary artery disease in his father; Heart attack in his father, paternal grandfather, and paternal uncle; Mitral valve prolapse in his mother. There is no history of Clotting disorder.  ROS:   Please see the history of present illness.     All other systems reviewed and are negative.  EKGs/Labs/Other Studies Reviewed:    The following studies were reviewed today: Notes from Dr. Ellyn Hack, multiple cardiac catheterization reports, Holter monitor  EKG:  EKG is ordered today.  It shows normal sinus rhythm, questionable left atrial abnormality, no ischemic abnormalities or Q waves, QTc 443 ms   Recent Labs: 10/10/2021: BUN 28; Creatinine, Ser 1.19; Hemoglobin 12.7; Platelets 192; Potassium 3.8; Sodium 135   07/05/2021 Hemoglobin A1c 7.1% Recent Lipid Panel    Component Value Date/Time   CHOL 123 08/25/2017 0805   TRIG 363 (H) 08/25/2017 0805   HDL 34 (L) 08/25/2017 0805   CHOLHDL 3.6 08/25/2017 0805   CHOLHDL 2.6 06/08/2017 0315   VLDL 62 (H) 06/08/2017 0315   LDLCALC 16 08/25/2017 0805  07/01/2021 Cholesterol 106, triglycerides 95, HDL 55, LDL 32  Physical Exam:    VS:  BP 120/82 (BP Location: Left Arm, Patient Position: Sitting, Cuff Size: Large)   Pulse 71   Ht '6\' 1"'$  (1.854 m)   Wt 263 lb 9.6 oz (119.6 kg)   SpO2 93%   BMI 34.78 kg/m     Wt Readings from Last 3 Encounters:  01/30/22 263 lb 9.6 oz (119.6 kg)  10/09/21 255 lb 15.3 oz (116.1 kg)  09/26/21 256 lb (116.1 kg)     General: Alert, oriented x3, no distress, moderately obese Head: no evidence of trauma, PERRL, EOMI, no exophtalmos or lid lag, no myxedema, no xanthelasma; normal ears, nose and oropharynx Neck: normal jugular venous pulsations and no hepatojugular reflux;  brisk carotid pulses without delay and no carotid bruits Chest: clear to auscultation, no signs of consolidation by percussion or palpation, normal fremitus, symmetrical and full respiratory excursions Cardiovascular: normal position and quality of the apical impulse, regular rhythm, normal first and second heart sounds, no murmurs, rubs or gallops Abdomen: no tenderness or distention, no masses by palpation, no abnormal pulsatility or arterial bruits, normal bowel sounds, no hepatosplenomegaly Extremities: no clubbing, cyanosis or edema; 2+ radial, ulnar and brachial pulses bilaterally; 2+ right femoral, posterior tibial and dorsalis pedis pulses; 2+ left femoral, posterior tibial and dorsalis pedis pulses; no subclavian or femoral bruits Neurological: grossly nonfocal Psych: Normal mood and affect   ASSESSMENT:    1. Coronary artery disease involving native coronary artery of native heart without angina pectoris   2. History of recurrent deep vein thrombosis (DVT)   3. Hypercholesterolemia   4. Non-insulin dependent type 2 diabetes mellitus (HCC)   5. Palpitations   6. Essential hypertension     PLAN:    In order of problems listed above:  CAD: Has not required revascularization since 2016 and is asymptomatic.  In view of his multiple previous thrombotic events, we could consider low-dose rivaroxaban 2.5 mg twice daily after he has his IVC filter removed. DVT: Had May Thurner syndrome.  Previous DVT involving the mesenteric/portal circulation was up during androgen supplement injection.  Although he does not have any identifiable hypercoagulable risk factors, he does appear to have a high propensity for both arterial and venous thrombosis. HLP: Excellent LDL cholesterol on PCSK9 inhibitor.  Even his HDL and his triglycerides have substantially improved compared to the past.  Continue the current lipid-lowering  regimen. DM: Fair control with most recent hemoglobin A1c 7.1%.  On Iva Boop  which is an excellent choice.  Unfortunately has gained back a little bit of weight.  We will strongly encourage weight loss. PACs: Not bothering him currently.  Continue beta-blocker.  We have never documented atrial fibrillation. HTN: Excellent control.  Continue ARB and carvedilol   Medication Adjustments/Labs and Tests Ordered: Current medicines are reviewed at length with the patient today.  Concerns regarding medicines are outlined above.  Orders Placed This Encounter  Procedures   EKG 12-Lead   No orders of the defined types were placed in this encounter.  Patient Instructions  Medication Instructions:  No changes *If you need a refill on your cardiac medications before your next appointment, please call your pharmacy*   Lab Work: None ordered If you have labs (blood work) drawn today and your tests are completely normal, you will receive your results only by: Reading (if you have MyChart) OR A paper copy in the mail If you have any lab test that is abnormal or we need to change your treatment, we will call you to review the results.   Testing/Procedures: None ordered   Follow-Up: At [facility], you and your health needs are our priority.  As part of our continuing mission to provide you with exceptional heart care, we have created designated Provider Care Teams.  These Care Teams include your primary Cardiologist (physician) and Advanced Practice Providers (APPs -  Physician Assistants and Nurse Practitioners) who all work together to provide you with the care you need, when you need it.  We recommend signing up for the patient portal called "MyChart".  Sign up information is provided on this After Visit Summary.  MyChart is used to connect with patients for Virtual Visits (Telemedicine).  Patients are able to view lab/test results, encounter notes, upcoming appointments, etc.  Non-urgent messages can be sent to your provider as well.   To learn more about what  you can do with MyChart, go to [email or web address].    Your next appointment:   12 month(s)  The format for your next appointment:   In Person  Provider:   [name], MD {    Important Information About Sugar         Signed, [name], MD  02/04/2022 2:42 PM    [place]

## 2022-01-30 NOTE — Patient Instructions (Signed)

## 2022-02-04 ENCOUNTER — Encounter: Payer: Self-pay | Admitting: Cardiovascular Disease

## 2022-02-07 ENCOUNTER — Other Ambulatory Visit: Payer: Self-pay | Admitting: Cardiovascular Disease

## 2022-02-12 ENCOUNTER — Encounter: Payer: Self-pay | Admitting: Cardiovascular Disease

## 2022-02-12 MED ORDER — NITROGLYCERIN 0.4 MG SL SUBL: 0.4000 mg | SUBLINGUAL_TABLET | SUBLINGUAL | 2 refills | Status: DC | PRN

## 2022-03-03 ENCOUNTER — Telehealth: Payer: Self-pay

## 2022-03-03 NOTE — Telephone Encounter (Signed)
RN returned called after verification of identity spoke briefly with patient.  Finished radiation treatment 1 year ago and was asking if there was a follow-up.  After checking his chart was able to see he indeed had follow-up with Freeman Caldron, PA-C via telephone visit.  Patient had forgotten and was asked if he had any concerns or complaints.  He reports feeling good and will be seeing his Urologist soon and will inform the radiation team if PSA is elevated.  Nothing else follows.

## 2022-03-13 ENCOUNTER — Other Ambulatory Visit: Payer: Self-pay

## 2022-03-13 ENCOUNTER — Encounter (HOSPITAL_BASED_OUTPATIENT_CLINIC_OR_DEPARTMENT_OTHER): Payer: Self-pay | Admitting: Emergency Medicine

## 2022-03-13 ENCOUNTER — Emergency Department (HOSPITAL_BASED_OUTPATIENT_CLINIC_OR_DEPARTMENT_OTHER): Payer: BLUE CROSS/BLUE SHIELD

## 2022-03-13 ENCOUNTER — Emergency Department (HOSPITAL_BASED_OUTPATIENT_CLINIC_OR_DEPARTMENT_OTHER)
Admission: EM | Admit: 2022-03-13 | Discharge: 2022-03-13 | Disposition: A | Discharge status: Home / Self Care | Payer: BLUE CROSS/BLUE SHIELD | Attending: Emergency Medicine | Admitting: Emergency Medicine

## 2022-03-13 DIAGNOSIS — Z8546 Personal history of malignant neoplasm of prostate: Secondary | ICD-10-CM | POA: Diagnosis not present

## 2022-03-13 DIAGNOSIS — Z7984 Long term (current) use of oral hypoglycemic drugs: Secondary | ICD-10-CM | POA: Insufficient documentation

## 2022-03-13 DIAGNOSIS — R319 Hematuria, unspecified: Secondary | ICD-10-CM

## 2022-03-13 DIAGNOSIS — E119 Type 2 diabetes mellitus without complications: Secondary | ICD-10-CM | POA: Diagnosis not present

## 2022-03-13 DIAGNOSIS — T82898A Other specified complication of vascular prosthetic devices, implants and grafts, initial encounter: Secondary | ICD-10-CM | POA: Diagnosis present

## 2022-03-13 DIAGNOSIS — R1010 Upper abdominal pain, unspecified: Secondary | ICD-10-CM

## 2022-03-13 DIAGNOSIS — Z7982 Long term (current) use of aspirin: Secondary | ICD-10-CM | POA: Diagnosis not present

## 2022-03-13 DIAGNOSIS — R1084 Generalized abdominal pain: Secondary | ICD-10-CM | POA: Insufficient documentation

## 2022-03-13 DIAGNOSIS — I999 Unspecified disorder of circulatory system: Secondary | ICD-10-CM

## 2022-03-13 DIAGNOSIS — R748 Abnormal levels of other serum enzymes: Secondary | ICD-10-CM | POA: Insufficient documentation

## 2022-03-13 LAB — COMPREHENSIVE METABOLIC PANEL
ALT: 24 U/L (ref 0–44)
AST: 22 U/L (ref 15–41)
Albumin: 3.9 g/dL (ref 3.5–5.0)
Alkaline Phosphatase: 67 U/L (ref 38–126)
Anion gap: 12 (ref 5–15)
BUN: 15 mg/dL (ref 6–20)
CO2: 30 mmol/L (ref 22–32)
Calcium: 9 mg/dL (ref 8.9–10.3)
Chloride: 100 mmol/L (ref 98–111)
Creatinine, Ser: 0.87 mg/dL (ref 0.61–1.24)
GFR, Estimated: >60 mL/min (ref >60)
Glucose, Bld: 143 mg/dL — ABNORMAL HIGH (ref 70–99)
Potassium: 3.9 mmol/L (ref 3.5–5.1)
Sodium: 142 mmol/L (ref 135–145)
Total Bilirubin: 0.9 mg/dL (ref 0.3–1.2)
Total Protein: 6.9 g/dL (ref 6.5–8.1)

## 2022-03-13 LAB — CBC WITH DIFFERENTIAL/PLATELET
Abs Immature Granulocytes: 0.02 10*3/uL (ref 0.00–0.07)
Basophils Absolute: 0 10*3/uL (ref 0.0–0.1)
Basophils Relative: 0 %
Eosinophils Absolute: 0.1 10*3/uL (ref 0.0–0.5)
Eosinophils Relative: 2 %
HCT: 41.3 % (ref 39.0–52.0)
Hemoglobin: 14.1 g/dL (ref 13.0–17.0)
Immature Granulocytes: 0 %
Lymphocytes Relative: 14 %
Lymphs Abs: 0.7 10*3/uL (ref 0.7–4.0)
MCH: 31.1 pg (ref 26.0–34.0)
MCHC: 34.1 g/dL (ref 30.0–36.0)
MCV: 91.2 fL (ref 80.0–100.0)
Monocytes Absolute: 0.5 10*3/uL (ref 0.1–1.0)
Monocytes Relative: 11 %
Neutro Abs: 3.8 10*3/uL (ref 1.7–7.7)
Neutrophils Relative %: 73 %
Platelets: 212 10*3/uL (ref 150–400)
RBC: 4.53 MIL/uL (ref 4.22–5.81)
RDW: 13.3 % (ref 11.5–15.5)
WBC: 5.1 10*3/uL (ref 4.0–10.5)
nRBC: 0 % (ref 0.0–0.2)

## 2022-03-13 LAB — URINALYSIS, ROUTINE W REFLEX MICROSCOPIC
Bilirubin Urine: NEGATIVE
Glucose, UA: NEGATIVE mg/dL
Ketones, ur: NEGATIVE mg/dL
Leukocytes,Ua: NEGATIVE
Nitrite: NEGATIVE
Protein, ur: NEGATIVE mg/dL
Specific Gravity, Urine: 1.009 (ref 1.005–1.030)
pH: 6.5 (ref 5.0–8.0)

## 2022-03-13 LAB — LIPASE, BLOOD: Lipase: 110 U/L — ABNORMAL HIGH (ref 11–51)

## 2022-03-13 MED ORDER — IOHEXOL 350 MG/ML SOLN
100.0000 mL | Freq: Once | INTRAVENOUS | Status: AC | PRN
  Administered 2022-03-13: 100 mL via INTRAVENOUS

## 2022-03-13 MED ORDER — FENTANYL CITRATE PF 50 MCG/ML IJ SOSY
25.0000 ug | PREFILLED_SYRINGE | Freq: Once | INTRAMUSCULAR | Status: AC
  Administered 2022-03-13: 25 ug via INTRAVENOUS
  Filled 2022-03-13: qty 1

## 2022-03-13 NOTE — ED Provider Notes (Signed)
[facility] EMERGENCY DEPT Provider Note   CSN: [id number] Arrival date & time: 03/13/22  2778     History  Chief Complaint  Patient presents with   Hematuria   Abdominal Pain    Dylan [name] is a 56 y.o. male with a history of localized prostate cancer, status post radiation therapy, history of coronary disease, history of extensive DVT, PE, IVC filter placement, type 2 diabetes, high cholesterol, presented to ED with abdominal pain and hematuria.  Patient reports insidious onset of diffuse abdominal pain, nonlocalized, about 2 weeks ago.  It is waxing and waning at different times, sometimes goes away.  It is not provoked by eating or any activities.  He reports no nausea, vomiting, diarrhea or constipation problems.  He reports he noted some blood tinge in his urine a few days ago, went to urgent care, was started on ciprofloxacin initially, then called and told he has no infection on his culture and discontinued off the antibiotic.  He is here for further evaluation.  Pain is minimal at this time  HPI     Home Medications Prior to Admission medications   Medication Sig Start Date End Date Taking? Authorizing Provider  acetaminophen (TYLENOL) 325 MG tablet Take 650 mg by mouth daily as needed (pain).    [provider]  aspirin EC 81 MG tablet Take 81 mg by mouth every morning. Swallow whole.    [provider]  carvedilol (COREG) 12.5 MG tablet TAKE 1 TABLET(12.5 MG) BY MOUTH TWICE DAILY 07/11/21   Croitoru, Mihai, MD  Empagliflozin-metFORMIN HCl ER (SYNJARDY XR) 25-1000 MG TB24 Take 1 tablet by mouth every morning.    [provider]  Evolocumab (REPATHA SURECLICK) 242 MG/ML SOAJ ADMINISTER 1 ML UNDER THE SKIN EVERY 14 DAYS 12/13/21   Croitoru, Mihai, MD  furosemide (LASIX) 40 MG tablet TAKE 1 TABLET BY MOUTH EVERY MORNING AND 2 TABLETS EVERY EVENING 11/18/21   Croitoru, Mihai, MD  nitroGLYCERIN (NITROSTAT) 0.4 MG SL tablet Place 1  tablet (0.4 mg total) under the tongue every 5 (five) minutes x 3 doses as needed for chest pain. 02/12/22   Croitoru, Mihai, MD  olmesartan (BENICAR) 40 MG tablet TAKE 1 TABLET(40 MG) BY MOUTH DAILY 02/07/22   Croitoru, Mihai, MD  traMADol (ULTRAM) 50 MG tablet Take 50 mg by mouth 3 (three) times daily as needed. Patient not taking: Reported on 01/30/2022 12/25/21   [provider]      Allergies    Crestor [rosuvastatin], Zofran [ondansetron hcl], Ace inhibitors, Lipitor [atorvastatin], and Codeine    Review of Systems   Review of Systems  Physical Exam Updated Vital Signs BP 133/84   Pulse 64   Temp 98.6 F (37 C) (Oral)   Resp 16   Ht '6\' 1"'$  (1.854 m)   Wt 117.9 kg   SpO2 98%   BMI 34.30 kg/m  Physical Exam Constitutional:      General: He is not in acute distress. HENT:     Head: Normocephalic and atraumatic.  Eyes:     Conjunctiva/sclera: Conjunctivae normal.     Pupils: Pupils are equal, round, and reactive to light.  Cardiovascular:     Rate and Rhythm: Normal rate and regular rhythm.  Pulmonary:     Effort: Pulmonary effort is normal. No respiratory distress.  Abdominal:     General: There is no distension.     Tenderness: There is no abdominal tenderness.  Skin:    General: Skin is warm  and dry.  Neurological:     General: No focal deficit present.     Mental Status: He is alert. Mental status is at baseline.  Psychiatric:        Mood and Affect: Mood normal.        Behavior: Behavior normal.     ED Results / Procedures / Treatments   Labs (all labs ordered are listed, but only abnormal results are displayed) Labs Reviewed  LIPASE, BLOOD - Abnormal; Notable for the following components:      Result Value   Lipase 110 (*)    All other components within normal limits  COMPREHENSIVE METABOLIC PANEL - Abnormal; Notable for the following components:   Glucose, Bld 143 (*)    All other components within normal limits  URINALYSIS, ROUTINE W REFLEX  MICROSCOPIC - Abnormal; Notable for the following components:   Hgb urine dipstick SMALL (*)    All other components within normal limits  CBC WITH DIFFERENTIAL/PLATELET    EKG None  Radiology CT Angio Abd/Pel W and/or Wo Contrast  Result Date: 03/13/2022 CLINICAL DATA:  insidious onset diffuse abdominal pain x 2 weeks AND hematuria Hx of portal vein thrombosis, prostate cancer, IVC filter placement EXAM: CTA ABDOMEN AND PELVIS WITH CONTRAST TECHNIQUE: Multidetector CT imaging of the abdomen and pelvis was performed using the standard protocol during bolus administration of intravenous contrast. Multiplanar reconstructed images and MIPs were obtained and reviewed to evaluate the vascular anatomy. RADIATION DOSE REDUCTION: This exam was performed according to the departmental dose-optimization program which includes automated exposure control, adjustment of the mA and/or kV according to patient size and/or use of iterative reconstruction technique. CONTRAST:  166m OMNIPAQUE IOHEXOL 350 MG/ML SOLN COMPARISON:  CTA chest 04/30/2021. CT AP, 09/21/2020. IR fluoroscopy 08/10/2014. FINDINGS: VASCULAR Aorta: Mild burden of aortoiliac atherosclerosis. Normal caliber aorta without aneurysm, dissection, vasculitis or significant stenosis. Celiac: Patent without evidence of aneurysm, dissection, vasculitis or significant stenosis. SMA: Patent without evidence of aneurysm, dissection, vasculitis or significant stenosis. Renals: Single bilateral renal arteries are present. Both renal arteries are patent without evidence of aneurysm, dissection, vasculitis, fibromuscular dysplasia or significant stenosis. IMA: Patent without evidence of aneurysm, dissection, vasculitis or significant stenosis. Pelvis: Patent without evidence of aneurysm, dissection, vasculitis or significant stenosis. Proximal Outflow: Bilateral common femoral and visualized portions of the superficial and profunda femoral arteries are patent without  evidence of aneurysm, dissection, vasculitis or significant stenosis. Veins: *Infrarenal inferior vena cava filter. *Central migration of prior LEFT common iliac vein stent embedded/retained within the IVC filter. *Poor contrast timing, however suspect the stent is relatively patent. Review of the MIP images confirms the above findings. NON-VASCULAR Lower chest: No acute abnormality. Dense burden of coronary atherosclerosis. Hepatobiliary: No focal liver abnormality. Hepatic parenchymal tract embolization. Patent portal vein. No gallstones, gallbladder wall thickening, or biliary dilatation. Pancreas: No pancreatic ductal dilatation or surrounding inflammatory changes. Spleen: Normal in size without focal abnormality. Adrenals/Urinary Tract: Adrenal glands are unremarkable. 2.5 cm LEFT midpolar exophytic cystic renal lesion with linear calcification. Kidneys are otherwise normal, without renal calculi, or hydronephrosis. Bladder is unremarkable. Stomach/Bowel: Stomach is within normal limits. Appendix is surgically absent. Postsurgical changes of ileostomy reversal in small-bowel resection with patent and intact-appearing anastomosis. No evidence of bowel wall thickening, distention, or inflammatory changes. Lymphatic: No enlarged abdominal or pelvic lymph nodes. Reproductive: Prostate brachytherapy. Other: Lower abdominal ventral herniorrhaphy. RIGHT periumbilical ostomy scar. No abdominal wall hernia. No abdominopelvic ascites. Musculoskeletal: Mild multilevel degenerative changes spine. No acute osseous  findings. IMPRESSION: VASCULAR 1. Central migration of prior LEFT common iliac vein stent, now embedded/retained within the infrarenal IVC filter. Poor contrast timing, however the stent appears patent. 2. No acute arterial vascular abnormality within the abdomen or pelvis. NON-VASCULAR 1. No acute abdominopelvic process. 2. Sequelae of prior portal vein thrombectomy with widely patent portal vein. 3. 2.5 cm left  Bosniak 2 benign cyst. No follow-up imaging is recommended. JACR 2018 Feb; 264-273, Management of the Incidental Renal mass on CT, [employer] 2021; 814-848, Bosniak Classification of Cystic Renal Masses, Version 2019. 4. Aortic Atherosclerosis (ICD10-I70.0) and coronary calcifications. Additional incidental, chronic and senescent findings as above. These results were called by telephone at the time of interpretation on 03/13/2022 at 12:12 pm to provider Servando Snare, who verbally acknowledged these results. Michaelle Birks, MD [facility] Electronically Signed   By: Michaelle Birks M.D.   On: 03/13/2022 12:20    Procedures Procedures    Medications Ordered in ED Medications  iohexol (OMNIPAQUE) 350 MG/ML injection 100 mL (100 mLs Intravenous Contrast Given 03/13/22 0955)    ED Course/ Medical Decision Making/ A&P Clinical Course as of 03/13/22 1326  Thu Mar 13, 2022  1228 Will page vascular surgery regarding iliac stent displacement noted on CT [MT]  1245 Dr Luan Pulling vascular recommending pt transferred to [facility] for him to eval [MT]  61 Dr Oswald Hillock EDP accepting.  Pt will go POV, stable condition here. [MT]    Clinical Course User Index [MT] Neely Cecena, Carola Rhine, MD                           Medical Decision Making Amount and/or Complexity of Data Reviewed Labs: ordered. Radiology: ordered.  Risk Prescription drug management.   This patient presents to the ED with concern for nonlocalized abdominal pain and hematuria. This involves an extensive number of treatment options, and is a complaint that carries with it a high risk of complications and morbidity.  The differential diagnosis includes ureteral colic versus prostate complication versus IVC displacement versus other  He has no rigidity, guarding, or localized tenderness of the abdomen, but describes diffuse abdominal pain that is intermittent.  CT angiogram would be  reasonable to reevaluate both IVC filter and to evaluate for additional potential vascular lesions or clots, given his extensive history of thrombosis.  Lower suspicion for bacterial/infectious prostatitis with no persistent pain, no rectal pressure, no fever at this time.  He has not had chemoradiation therapy in several months, and therefore would not likely be dealing with radiation proctitis.  Co-morbidities that complicate the patient evaluation: High risk for clotting disorder, recurring prostate problems.  External records from outside source obtained and reviewed including cardiology office evaluation, urology office evaluations  I ordered and personally interpreted labs.  The pertinent results include:  lipase 110; liver enzymes normal, no leukocytosis, no other significant findings.  I ordered imaging studies including CT angiogram of the abdomen pelvis. I independently visualized and interpreted imaging which showed unremarkable pancreas, no evidence of gallstones, no evident kidney stones, there is migration of the left common iliac vein filter into the IVC, now abutting the IVC filter.  The filter appears patent at this time.  No other infarcts or vascular injuries noted on imaging. I agree with the radiologist interpretation   I requested consultation with the vascular surgery, Dr Luan Pulling,  and discussed lab and imaging findings as well as pertinent plan - they recommend:  transfer to [facility] Ed for vascular surgeon consultation  After the interventions noted above, I reevaluated the patient and found that they have: stayed the same  Patient remains well-appearing, symptoms are extremely minimal including abdominal discomfort.  *  Based on this workup, the patient will transfer by private vehicle to [facility] ED for vascular surgery consultation.  It is not clear if his pain is coming from the stent migration or from his very mild pancreatitis, based on his elevated lipase enzyme levels.   There is also no clear cause for this pancreatic change, as the patient reports he drinks only very light alcohol, every other day, and does not appear to have gallstone pathology at this time, or new medications as a provoking factor. I do think that the mild pancreatitis could be managed as an outpatient with fluids and a bland diet, given how comfortable the patient looks.  However he will still need vascular surgery consultation for evaluation of his IVC filter and iliac stent.        Final Clinical Impression(s) / ED Diagnoses Final diagnoses:  Vascular complication  Elevated lipase    Rx / DC Orders ED Discharge Orders     None         Wyvonnia Dusky, MD 03/13/22 1326

## 2022-03-13 NOTE — ED Notes (Signed)
Patient transported to CT 

## 2022-03-13 NOTE — ED Provider Notes (Signed)
Pt is here from [facility] to see vascular.  He initially presented there with some abd pain and some hematuria.  He had a CT scan which showed migration of left common iliac vein which is now embedded in the infrarenal IVC filter.  Pt said his pain is manageable.  Dr. Kathaleen Grinder OR nurse notified that the pt is here.  Dr. Luan Pulling saw pt in the ED and recommends Xarelto.  Pt has some at home.  He will f/u with vascular as an outpatient.  As far as the hematuria goes, pt will need to f/u with urology.  Pt has an appt in a few days.  He is aware it may worsen with the Xarelto.  Pt is stable for d/c.  Return if worse.     Isla Pence, MD 03/13/22 1758

## 2022-03-13 NOTE — Discharge Instructions (Addendum)
Start taking your Xarelto as directed by Dr. Luan Pulling.

## 2022-03-13 NOTE — Consult Note (Signed)
[facility] [place]  ASSESSMENT / PLAN: 56 y.o. male with embolized left common iliac vein stent placed for May Thurner syndrome on 08/19/21.  Thankfully this has been trapped by his inferior vena cava filter.  His and iliac veins appear patent.  He is not on any anticoagulation.  I counseled him that this is not likely the cause of his abdominal pain.  Encouraged him to resume his Xarelto tonight.  We will work him into clinic as soon as possible.  CHIEF COMPLAINT: Abdominal pain, hematuria  HISTORY OF PRESENT ILLNESS: Dylan [name] is a 56 y.o. male well-known to our service for extensive deep venous thrombosis, May Thurner syndrome.  He underwent a venous thrombectomy 04/29/2021.  He then underwent left iliac vein stenting and IVC filter placement 08/19/2021.  He was seen in follow-up in February as well.  In the past several weeks he has developed abdominal pain.  This is ill-defined and he points on my evaluation to his right upper quadrant.  His wife reports the pain has been more diffuse.  He noticed some hematuria earlier today and sought care.  A CT angiogram was performed of the abdomen pelvis which showed that a previously placed left common iliac stent had embolized to his by the CT filter.  Past Medical History:  Diagnosis Date   Arthritis    CAD (coronary artery disease) 07/2010; 07/2012; 08/2014   cardiologist--- dr croitoru;  1) 11-28-'11: NSTEMI - 100% RCA - PCI (3 Taxus ION DES 3.0 x 28, 3.0 x 24 & 3.5 x 12 distal-prox); b) 12/ 2013 ISR of RCA stent - -AnngioSculpt PTCA; c) NSTEMI 1/'16: mild ISR with Thrombosis of RCA Stents (in setting of Mesenteric Vein Thrombosis) - Cutting Balloon PTCA; c. Re-look Cath 07/2015 & 07/2016: ~15% ISR in RCA,20% p-mLAD & o-pCx.   Chronic gout    01-07-2021  last flare-up approxl 12-17-2020 bilteral great toes   DM type 2 (diabetes mellitus, type 2) ([place])    followed by pcp   (01-07-2021 per pt does not check blood sugar at  home)   Edema of both lower extremities    01-07-2021  pt states wears compression hose   Essential hypertension    followed by pcp and cardiology   History of concussion    01-07-2021  per pt x4 as child, last one age 68 without residual   History of CVA with residual deficit 41/74/0814   embolic left MCA infarct post PCI stenting approx one hour  (01-07-2021 per pt has residual minimal intermittant aphasia )   History of non-ST elevation myocardial infarction (NSTEMI) 07-01-2010  and 01/ 2016   History of prostatitis    History of resection of small bowel 08/28/2014   small bowel perforation,  s/p resction    History of retinal tear 04/2013   right micro-rupture , no occlusion, retinal artery branch w/ vision loss,  per imaging no infarct/ occlusion   History of thrombosis 08/2014   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, provoked by testosterone injection,  08-09-2014 s/p thrombolytic lysis VIR    Hyperlipidemia with target LDL less than 70 09/01/2014   May-Thurner syndrome    Myocardial infarction ([place]) 2011   and 2016   Nocturia    PAC (premature atrial contraction)    pt hx palpitations, event monitor 08-18-2017  showed NS w/ occasional PACs, rare PVCs   Peripheral vascular disease ([place])    Prostate cancer [facility]) urologist-- dr herrick/  oncologist-- dr Tammi Klippel   dx 09-04-2020,  Stage T1c, Gleason 4+3    S/P drug eluting coronary stent placement 07/01/2010   DES x3  to prox and distal RCA   S/P percutaneous transluminal coronary angioplasty 12/ 2013 and 01/ 2016   for ISR  RCA   Stroke [facility]) 2011   some residual aphasia    Past Surgical History:  Procedure Laterality Date   BOWEL RESECTION N/A 08/28/2014   Procedure: SMALL BOWEL RESECTION;  Surgeon: [name], MD;  Location: [place];  Service: General;  Laterality: N/A;   CARDIAC CATHETERIZATION N/A 07/20/2015   Procedure: Left Heart Cath and Coronary Angiography;  Surgeon: [name], MD;   Location: [place] [facility];  Service: Cardiovascular;  15% ISR in the RCA stent segment, 20% proximal-mid LAD as well as ostial and proximal circumflex. Normal LV function.   CARDIAC CATHETERIZATION N/A 07/23/2016   Procedure: Left Heart Cath and Coronary Angiography;  Surgeon: [name], MD;  Location: [place] [facility];  Service: Cardiovascular: E 55-65%. ~15% diffuse RCA ISR, ~20% diffuse pLAD & pCx   CORONARY ANGIOPLASTY  07/06/2012; 08/15/2014   a) 12/'13: PTCA of 80% ISR mRCA - AngioSculpt; b) PTCA of  ISR/thrombosis   CORONARY ANGIOPLASTY WITH STENT PLACEMENT  07-01-2010  dr harding '@[facility]'$    inferior wall MI - 3 Taxus Ion DES (3.0x14m, 3.0x219m 3.5x1256mto prox and mid RCA   CYSTOSCOPY N/A 01/10/2021   Procedure: CYSTOSCOPY FLEXIBLE;  Surgeon: HerArdis HughsD;  [facility];  Laterality: N/A;   INCISIONAL HERNIA REPAIR N/A 10/15/2017   Procedure: HERNIA REPAIR INCISIONAL;  Surgeon: BlaCoralie KeensD;  [facility];  Service: General;  Laterality: N/A;   INGUINAL HERNIA REPAIR Bilateral 05/2010   INSERTION OF MESH N/A 10/15/2017   Procedure: INSERTION OF MESH;  Surgeon: BlaCoralie KeensD;  [facility];  Service: General;  Laterality: N/A;   INTRAVASCULAR ULTRASOUND/IVUS Left 04/29/2021   Procedure: Intravascular Ultrasound/IVUS;  Surgeon: CaiWaynetta SandyD;  [facility];  Service: Cardiovascular;  Laterality: Left;   IVC FILTER INSERTION N/A 08/19/2021   Procedure: IVC FILTER INSERTION;  Surgeon: CaiWaynetta SandyD;  [facility] [place] [facility];  Service: Cardiovascular;  Laterality: N/A;   KNEE ARTHROPLASTY Left 10/09/2021   Procedure: COMPUTER ASSISTED TOTAL KNEE ARTHROPLASTY;  Surgeon: SwiRod CanD;  [facility];  Service: Orthopedics;  Laterality: Left;  150   KNEE ARTHROSCOPY W/ MENISCAL REPAIR Left 06/2011   LAPAROSCOPIC APPENDECTOMY N/A 08/28/2014   Procedure:  APPENDECTOMY LAPAROSCOPIC;  Surgeon: DouCoralie KeensD;  [facility];  Laterality: N/A;   LAPAROSCOPY  08/28/2014   Procedure: LAPAROSCOPY DIAGNOSTIC;  Surgeon: DouCoralie KeensD;  [facility];;   LEFT HEART CATHETERIZATION WITH CORONARY ANGIOGRAM N/A 06/11/2012   Procedure: LEFT HEART CATHETERIZATION WITH CORONARY ANGIOGRAM;  Surgeon: MihSanda KleinD;  [facility];  Service: Cardiovascular;  Laterality: N/A;   LEFT HEART CATHETERIZATION WITH CORONARY ANGIOGRAM N/A 04/18/2013   Procedure: LEFT HEART CATHETERIZATION WITH CORONARY ANGIOGRAM;  Surgeon: ThoTroy SineD;  [facility];  Service: Cardiovascular;  Laterality: N/A;   LEFT HEART CATHETERIZATION WITH CORONARY ANGIOGRAM N/A 08/15/2014   Procedure: LEFT HEART CATHETERIZATION WITH CORONARY ANGIOGRAM;  Surgeon: DavLeonie ManD;  [facility];  Service: Cardiovascular;  Laterality: N/A;   LOWER EXTREMITY VENOGRAPHY Left 04/29/2021  Procedure: LOWER EXTREMITY VENOGRAPHY;  Surgeon: Waynetta Sandy, MD;  Location: [place] [facility];  Service: Cardiovascular;  Laterality: Left;   LOWER EXTREMITY VENOGRAPHY Left 08/19/2021   Procedure: LOWER EXTREMITY VENOGRAPHY;  Surgeon: Waynetta Sandy, MD;  Location: [place] [facility];  Service: Cardiovascular;  Laterality: Left;   PERCUTANEOUS CORONARY STENT INTERVENTION (PCI-S) N/A 07/06/2012   Procedure: PERCUTANEOUS CORONARY STENT INTERVENTION (PCI-S);  Surgeon: [name], MD;  Location: [facility];  Service: Cardiovascular;  Laterality: N/A;   PERIPHERAL VASCULAR INTERVENTION Left 08/19/2021   Procedure: PERIPHERAL VASCULAR INTERVENTION;  Surgeon: Waynetta Sandy, MD;  Location: [place] [facility];  Service: Cardiovascular;  Laterality: Left;  Common Iliac Venous   PERIPHERAL VASCULAR THROMBECTOMY Left 04/29/2021   Procedure: PERIPHERAL VASCULAR THROMBECTOMY;  Surgeon: Waynetta Sandy, MD;  Location: [place] [facility];  Service: Cardiovascular;  Laterality: Left;   RADIOACTIVE SEED IMPLANT N/A 01/10/2021   Procedure: RADIOACTIVE SEED IMPLANT/BRACHYTHERAPY IMPLANT;  Surgeon: Ardis Hughs, MD;  Location: [facility];  Service: Urology;  Laterality: N/A;   SEPTOPLASTY  11-01-2001 @ [place]   SPACE OAR INSTILLATION N/A 01/10/2021   Procedure: SPACE OAR INSTILLATION;  Surgeon: Ardis Hughs, MD;  Location[facility];  Service: Urology;  Laterality: N/A;   TONSILLECTOMY AND ADENOIDECTOMY  age 58   TRANSTHORACIC ECHOCARDIOGRAM  08/15/2014   Moderate concentric LVH. EF 60-65% with no regional WMA. Gr 1 DD, otherwise normal    Family History  Problem Relation Age of Onset   Atrial fibrillation Mother    Mitral valve prolapse Mother    Coronary artery disease Father        CABG, aortic aneursym,   Heart attack Father    Heart attack Paternal Uncle    Heart attack Paternal Grandfather    Clotting disorder Neg Hx     Social History   Socioeconomic History   Marital status: Married    Spouse name: Curt Bears   Number of children: 4   Years of education: 12   Highest education level: Not on file  Occupational History   Occupation: Pharmacologist: [employer]  Tobacco Use   Smoking status: Former    Packs/day: 1.00    Years: 15.00    Total pack years: 15.00    Types: Cigarettes    Quit date: 06/30/2010    Years since quitting: 11.7   Smokeless tobacco: Current    Types: Snuff  Vaping Use   Vaping Use: Never used  Substance and Sexual Activity   Alcohol use: Yes    Alcohol/week: 7.0 - 14.0 standard drinks of alcohol    Types: 7 - 14 Shots of liquor per week    Comment: 1-2 bourbon on the rocks per day   Drug use: No   Sexual activity: Yes    Birth control/protection: Surgical    Comment: vasectomy  Other Topics Concern   Not on file  Social History Narrative   Not on file   Social Determinants of  Health   Financial Resource Strain: Low Risk  (06/13/2021)   Overall Financial Resource Strain (CARDIA)    Difficulty of Paying Living Expenses: Not hard at all  Food Insecurity: No Food Insecurity (06/13/2021)   Hunger Vital Sign    Worried About Running Out of Food in the Last Year: Never true    Ran Out of Food in the Last Year: Never true  Transportation Needs: No Transportation Needs (  06/13/2021)   PRAPARE - Hydrologist (Medical): No    Lack of Transportation (Non-Medical): No  Physical Activity: Insufficiently Active (06/13/2021)   Exercise Vital Sign    Days of Exercise per Week: 2 days    Minutes of Exercise per Session: 30 min  Stress: No Stress Concern Present (06/13/2021)   [place]    Feeling of Stress : Not at all  Social Connections: Moderately Isolated (06/13/2021)   Social Connection and Isolation Panel [NHANES]    Frequency of Communication with Friends and Family: More than three times a week    Frequency of Social Gatherings with Friends and Family: More than three times a week    Attends Religious Services: Never    Marine scientist or Organizations: No    Attends Archivist Meetings: Never    Marital Status: Married  Human resources officer Violence: Not At Risk (06/13/2021)   Humiliation, Afraid, Rape, and Kick questionnaire    Fear of Current or Ex-Partner: No    Emotionally Abused: No    Physically Abused: No    Sexually Abused: No    Allergies  Allergen Reactions   Crestor [Rosuvastatin] Nausea Only and Other (See Comments)    Severe muscle pain and nausea   Zofran [Ondansetron Hcl] Nausea And Vomiting   Ace Inhibitors Cough   Lipitor [Atorvastatin] Other (See Comments)    myalgia   Codeine Itching    No current facility-administered medications for this encounter.   Current Outpatient Medications  Medication Sig Dispense Refill    acetaminophen (TYLENOL) 325 MG tablet Take 650 mg by mouth daily as needed (pain).     aspirin EC 81 MG tablet Take 81 mg by mouth every morning. Swallow whole.     carvedilol (COREG) 12.5 MG tablet TAKE 1 TABLET(12.5 MG) BY MOUTH TWICE DAILY 180 tablet 3   Empagliflozin-metFORMIN HCl ER (SYNJARDY XR) 25-1000 MG TB24 Take 1 tablet by mouth every morning.     Evolocumab (REPATHA SURECLICK) 098 MG/ML SOAJ ADMINISTER 1 ML UNDER THE SKIN EVERY 14 DAYS 6 mL 0   furosemide (LASIX) 40 MG tablet TAKE 1 TABLET BY MOUTH EVERY MORNING AND 2 TABLETS EVERY EVENING 180 tablet 0   nitroGLYCERIN (NITROSTAT) 0.4 MG SL tablet Place 1 tablet (0.4 mg total) under the tongue every 5 (five) minutes x 3 doses as needed for chest pain. 25 tablet 2   olmesartan (BENICAR) 40 MG tablet TAKE 1 TABLET(40 MG) BY MOUTH DAILY 90 tablet 1   traMADol (ULTRAM) 50 MG tablet Take 50 mg by mouth 3 (three) times daily as needed. (Patient not taking: Reported on 01/30/2022)      PHYSICAL EXAM Vitals:   03/13/22 1200 03/13/22 1245 03/13/22 1323 03/13/22 1742  BP: (!) 140/94 133/84  (!) 160/98  Pulse: 63 64  73  Resp: '16 16  20  '$ Temp: 98.9 F (37.2 C)  98.6 F (37 C) 98.8 F (37.1 C)  TempSrc: Oral  Oral Oral  SpO2: 98% 98%  98%  Weight:      Height:       Well-appearing middle-aged gentleman in no acute distress Regular rate and rhythm Abdomen. Soft, nontender, nondistended abdomen Chronic disease skin changes of the left lower extremity consistent with chronic venous insufficiency Well-healed left cervical small incision distal to the incision consistent with total knee arthroplasty  PERTINENT LABORATORY AND RADIOLOGIC DATA  Most recent CBC  Latest Ref Rng & Units 03/13/2022    8:28 AM 10/10/2021    3:15 AM 09/26/2021   10:38 AM  CBC  WBC 4.0 - 10.5 K/uL 5.1  8.3  4.2   Hemoglobin 13.0 - 17.0 g/dL 14.1  12.7  14.2   Hematocrit 39.0 - 52.0 % 41.3  37.2  42.0   Platelets 150 - 400 K/uL 212  192  233      Most  recent CMP    Latest Ref Rng & Units 03/13/2022    8:27 AM 10/10/2021    3:15 AM 09/26/2021   10:38 AM  CMP  Glucose 70 - 99 mg/dL 143  174  104   BUN 6 - 20 mg/dL '15  28  20   '$ Creatinine 0.61 - 1.24 mg/dL 0.87  1.19  0.94   Sodium 135 - 145 mmol/L 142  135  139   Potassium 3.5 - 5.1 mmol/L 3.9  3.8  3.6   Chloride 98 - 111 mmol/L 100  99  101   CO2 22 - 32 mmol/L '30  28  29   '$ Calcium 8.9 - 10.3 mg/dL 9.0  8.9  9.3   Total Protein 6.5 - 8.1 g/dL 6.9     Total Bilirubin 0.3 - 1.2 mg/dL 0.9     Alkaline Phos 38 - 126 U/L 67     AST 15 - 41 U/L 22     ALT 0 - 44 U/L 24       Renal function Estimated Creatinine Clearance: 126 mL/min (by C-G formula based on SCr of 0.87 mg/dL).  Hgb A1c MFr Bld (%)  Date Value  09/26/2021 6.1 (H)    LDL Calculated  Date Value Ref Range Status  08/25/2017 16 0 - 99 mg/dL Final    CT angiogram personally reviewed.  Previously placed left common iliac vein stent has embolized.  This has been trapped by the inferior vena cava filter.  I agree with the reading radiologist veins below this hardware appear patent without evidence of deep venous thrombosis.  Yevonne Aline. Stanford Breed, MD [facility] Phone Number: [phone] 03/13/2022 5:46 PM  Total time spent on preparing this encounter including chart review, data review, collecting history, examining the patient, coordinating care for this established patient, 40 minutes.  Portions of this report may have been transcribed using voice recognition software.  Every effort has been made to ensure accuracy; however, inadvertent computerized transcription errors may still be present.

## 2022-03-13 NOTE — ED Provider Notes (Signed)
I received a transfer request from [place] emergency department, Dr. Langston Masker.  This patient is a vascular surgery patient who has a left iliac stent that has migrated to his IVC causing abdominal pain.  Vascular surgery Dr. Luan Pulling is aware and would like to evaluate patient in emergency department.  Patient is going POV and is hemodynamically stable.   Tretha Sciara, MD 03/13/22 1535

## 2022-03-13 NOTE — ED Triage Notes (Signed)
Hematuria for several days. Went to UC and was neg for UTI. Also reports intermittent abd pain x 1 week.

## 2022-03-13 NOTE — ED Notes (Signed)
Pt POV transfer to [facility] ED per MD order, transfer paperwork reviewed with pt, pt verbalizes understanding. Nursing report given to [facility] ED charge RN-Annie. Pt ambulatory off unit. No s/s of acute distress noted.

## 2022-03-13 NOTE — ED Notes (Signed)
Vascular at the bedside. 

## 2022-03-13 NOTE — ED Notes (Signed)
Messaged MD for pain medication

## 2022-03-19 ENCOUNTER — Ambulatory Visit (INDEPENDENT_AMBULATORY_CARE_PROVIDER_SITE_OTHER): Payer: BLUE CROSS/BLUE SHIELD | Admitting: Vascular Surgery

## 2022-03-19 ENCOUNTER — Encounter: Payer: Self-pay | Admitting: Vascular Surgery

## 2022-03-19 VITALS — BP 127/81 | HR 70 | Temp 98.0°F | Ht 73.0 in | Wt 266.4 lb

## 2022-03-19 DIAGNOSIS — I871 Compression of vein: Secondary | ICD-10-CM | POA: Diagnosis not present

## 2022-03-19 MED ORDER — RIVAROXABAN 20 MG PO TABS: 20.0000 mg | ORAL_TABLET | Freq: Every day | ORAL | 11 refills | Status: DC

## 2022-03-19 NOTE — Progress Notes (Signed)
Patient ID: Dylan [name], male   DOB: Dec 07, 1965, 56 y.o.   MRN: [id number]  Reason for Consult: New Patient (Initial Visit)   Referred by [name], PA-C  Subjective:     HPI:  Dylan [name] is a 56 y.o. male PE with May Thurner syndrome underwent venous thrombectomy and subsequent stent and IVC filter placement in January of this year.  He followed up in February and the stent was in place he was not having issues and subsequent replacement.  Last week he was evaluated for hematuria abdominal pain with CT scan.  He states this is all resolved.  He has began taking his Xarelto again without any further bleeding issues.  He has stable left lower extremity swelling that is not changed from our last visit.  Past Medical History:  Diagnosis Date   Arthritis    CAD (coronary artery disease) 07/2010; 07/2012; 08/2014   cardiologist--- dr croitoru;  1) 11-28-'11: NSTEMI - 100% RCA - PCI (3 Taxus ION DES 3.0 x 28, 3.0 x 24 & 3.5 x 12 distal-prox); b) 12/ 2013 ISR of RCA stent - -AnngioSculpt PTCA; c) NSTEMI 1/'16: mild ISR with Thrombosis of RCA Stents (in setting of Mesenteric Vein Thrombosis) - Cutting Balloon PTCA; c. Re-look Cath 07/2015 & 07/2016: ~15% ISR in RCA,20% p-mLAD & o-pCx.   Chronic gout    01-07-2021  last flare-up approxl 12-17-2020 bilteral great toes   DM type 2 (diabetes mellitus, type 2) ([place])    followed by pcp   (01-07-2021 per pt does not check blood sugar at home)   Edema of both lower extremities    01-07-2021  pt states wears compression hose   Essential hypertension    followed by pcp and cardiology   History of concussion    01-07-2021  per pt x4 as child, last one age 70 without residual   History of CVA with residual deficit 93/26/7124   embolic left MCA infarct post PCI stenting approx one hour  (01-07-2021 per pt has residual minimal intermittant aphasia )   History of non-ST elevation myocardial infarction (NSTEMI) 07-01-2010  and 01/ 2016    History of prostatitis    History of resection of small bowel 08/28/2014   small bowel perforation,  s/p resction    History of retinal tear 04/2013   right micro-rupture , no occlusion, retinal artery branch w/ vision loss,  per imaging no infarct/ occlusion   History of thrombosis 08/2014   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, provoked by testosterone injection,  08-09-2014 s/p thrombolytic lysis VIR    Hyperlipidemia with target LDL less than 70 09/01/2014   May-Thurner syndrome    Myocardial infarction ([place]) 2011   and 2016   Nocturia    PAC (premature atrial contraction)    pt hx palpitations, event monitor 08-18-2017  showed NS w/ occasional PACs, rare PVCs   Peripheral vascular disease ([place])    Prostate cancer [facility]) urologist-- dr herrick/  oncologist-- dr Tammi Klippel   dx 09-04-2020,  Stage T1c, Gleason 4+3    S/P drug eluting coronary stent placement 07/01/2010   DES x3  to prox and distal RCA   S/P percutaneous transluminal coronary angioplasty 12/ 2013 and 01/ 2016   for ISR  RCA   Stroke [facility]) 2011   some residual aphasia   Family History  Problem Relation Age of Onset   Atrial fibrillation Mother    Mitral valve prolapse Mother  Coronary artery disease Father        CABG, aortic aneursym,   Heart attack Father    Heart attack Paternal Uncle    Heart attack Paternal Grandfather    Clotting disorder Neg Hx    Past Surgical History:  Procedure Laterality Date   BOWEL RESECTION N/A 08/28/2014   Procedure: SMALL BOWEL RESECTION;  Surgeon: [name], MD;  Location: [place];  Service: General;  Laterality: N/A;   CARDIAC CATHETERIZATION N/A 07/20/2015   Procedure: Left Heart Cath and Coronary Angiography;  Surgeon: [name], MD;  Location: [place] [facility];  Service: Cardiovascular;  15% ISR in the RCA stent segment, 20% proximal-mid LAD as well as ostial and proximal circumflex. Normal LV function.   CARDIAC CATHETERIZATION N/A  07/23/2016   Procedure: Left Heart Cath and Coronary Angiography;  Surgeon: [name], MD;  Location: [place] CV [facility];  Service: Cardiovascular: E 55-65%. ~15% diffuse RCA ISR, ~20% diffuse pLAD & pCx   CORONARY ANGIOPLASTY  07/06/2012; 08/15/2014   a) 12/'13: PTCA of 80% ISR mRCA - AngioSculpt; b) PTCA of  ISR/thrombosis   CORONARY ANGIOPLASTY WITH STENT PLACEMENT  07-01-2010  dr harding '@[facility]'$    inferior wall MI - 3 Taxus Ion DES (3.0x5m, 3.0x230m 3.5x1262mto prox and mid RCA   CYSTOSCOPY N/A 01/10/2021   Procedure: CYSTOSCOPY FLEXIBLE;  Surgeon: HerArdis HughsD;  [facility];  Laterality: N/A;   INCISIONAL HERNIA REPAIR N/A 10/15/2017   Procedure: HERNIA REPAIR INCISIONAL;  Surgeon: BlaCoralie KeensD;  [facility];  Service: General;  Laterality: N/A;   INGUINAL HERNIA REPAIR Bilateral 05/2010   INSERTION OF MESH N/A 10/15/2017   Procedure: INSERTION OF MESH;  Surgeon: BlaCoralie KeensD;  [facility];  Service: General;  Laterality: N/A;   INTRAVASCULAR ULTRASOUND/IVUS Left 04/29/2021   Procedure: Intravascular Ultrasound/IVUS;  Surgeon: CaiWaynetta SandyD;  [facility] [place] [facility];  Service: Cardiovascular;  Laterality: Left;   IVC FILTER INSERTION N/A 08/19/2021   Procedure: IVC FILTER INSERTION;  Surgeon: CaiWaynetta SandyD;  [facility] [place] [facility];  Service: Cardiovascular;  Laterality: N/A;   KNEE ARTHROPLASTY Left 10/09/2021   Procedure: COMPUTER ASSISTED TOTAL KNEE ARTHROPLASTY;  Surgeon: SwiRod CanD;  [facility];  Service: Orthopedics;  Laterality: Left;  150   KNEE ARTHROSCOPY W/ MENISCAL REPAIR Left 06/2011   LAPAROSCOPIC APPENDECTOMY N/A 08/28/2014   Procedure: APPENDECTOMY LAPAROSCOPIC;  Surgeon: DouCoralie KeensD;  [facility];  Laterality: N/A;   LAPAROSCOPY  08/28/2014   Procedure: LAPAROSCOPY DIAGNOSTIC;  Surgeon: DouCoralie KeensD;   [facility] [place];;   LEFT HEART CATHETERIZATION WITH CORONARY ANGIOGRAM N/A 06/11/2012   Procedure: LEFT HEART CATHETERIZATION WITH CORONARY ANGIOGRAM;  Surgeon: MihSanda KleinD;  [facility];  Service: Cardiovascular;  Laterality: N/A;   LEFT HEART CATHETERIZATION WITH CORONARY ANGIOGRAM N/A 04/18/2013   Procedure: LEFT HEART CATHETERIZATION WITH CORONARY ANGIOGRAM;  Surgeon: ThoTroy SineD;  [facility];  Service: Cardiovascular;  Laterality: N/A;   LEFT HEART CATHETERIZATION WITH CORONARY ANGIOGRAM N/A 08/15/2014   Procedure: LEFT HEART CATHETERIZATION WITH CORONARY ANGIOGRAM;  Surgeon: DavLeonie ManD;  [facility];  Service: Cardiovascular;  Laterality: N/A;   LOWER EXTREMITY VENOGRAPHY Left 04/29/2021   Procedure: LOWER EXTREMITY VENOGRAPHY;  Surgeon: CaiWaynetta SandyD;  [facility] [place] [facility];  Service: Cardiovascular;  Laterality: Left;   LOWER EXTREMITY VENOGRAPHY Left 08/19/2021   Procedure: LOWER EXTREMITY VENOGRAPHY;  Surgeon: Waynetta Sandy, MD;  Location: [place] [facility];  Service: Cardiovascular;  Laterality: Left;   PERCUTANEOUS CORONARY STENT INTERVENTION (PCI-S) N/A 07/06/2012   Procedure: PERCUTANEOUS CORONARY STENT INTERVENTION (PCI-S);  Surgeon: [name], MD;  [facility];  Service: Cardiovascular;  Laterality: N/A;   PERIPHERAL VASCULAR INTERVENTION Left 08/19/2021   Procedure: PERIPHERAL VASCULAR INTERVENTION;  Surgeon: Waynetta Sandy, MD;  Location: [place] CV [facility];  Service: Cardiovascular;  Laterality: Left;  Common Iliac Venous   PERIPHERAL VASCULAR THROMBECTOMY Left 04/29/2021   Procedure: PERIPHERAL VASCULAR THROMBECTOMY;  Surgeon: Waynetta Sandy, MD;  Location: [place] [facility];  Service: Cardiovascular;  Laterality: Left;   RADIOACTIVE SEED IMPLANT N/A 01/10/2021   Procedure: RADIOACTIVE SEED IMPLANT/BRACHYTHERAPY IMPLANT;  Surgeon: Ardis Hughs, MD;  [facility];  Service: Urology;  Laterality: N/A;   SEPTOPLASTY  11-01-2001 @ [place]   SPACE OAR INSTILLATION N/A 01/10/2021   Procedure: SPACE OAR INSTILLATION;  Surgeon: Ardis Hughs, MD;  Location: [facility];  Service: Urology;  Laterality: N/A;   TONSILLECTOMY AND ADENOIDECTOMY  age 8   TRANSTHORACIC ECHOCARDIOGRAM  08/15/2014   Moderate concentric LVH. EF 60-65% with no regional WMA. Gr 1 DD, otherwise normal    Short Social History:  Social History   Tobacco Use   Smoking status: Former    Packs/day: 1.00    Years: 15.00    Total pack years: 15.00    Types: Cigarettes    Quit date: 06/30/2010    Years since quitting: 11.7   Smokeless tobacco: Current    Types: Snuff  Substance Use Topics   Alcohol use: Yes    Alcohol/week: 7.0 - 14.0 standard drinks of alcohol    Types: 7 - 14 Shots of liquor per week    Comment: 1-2 bourbon on the rocks per day    Allergies  Allergen Reactions   Crestor [Rosuvastatin] Nausea Only and Other (See Comments)    Severe muscle pain and nausea   Zofran [Ondansetron Hcl] Nausea And Vomiting   Ace Inhibitors Cough   Lipitor [Atorvastatin] Other (See Comments)    myalgia   Codeine Itching    Current Outpatient Medications  Medication Sig Dispense Refill   acetaminophen (TYLENOL) 325 MG tablet Take 650 mg by mouth daily as needed (pain).     aspirin EC 81 MG tablet Take 81 mg by mouth every morning. Swallow whole.     carvedilol (COREG) 12.5 MG tablet TAKE 1 TABLET(12.5 MG) BY MOUTH TWICE DAILY 180 tablet 3   Empagliflozin-metFORMIN HCl ER (SYNJARDY XR) 25-1000 MG TB24 Take 1 tablet by mouth every morning.     Evolocumab (REPATHA SURECLICK) 332 MG/ML SOAJ ADMINISTER 1 ML UNDER THE SKIN EVERY 14 DAYS 6 mL 0   furosemide (LASIX) 40 MG tablet TAKE 1 TABLET BY MOUTH EVERY MORNING AND 2 TABLETS EVERY EVENING 180 tablet 0   nitroGLYCERIN (NITROSTAT) 0.4 MG SL tablet Place 1 tablet (0.4  mg total) under the tongue every 5 (five) minutes x 3 doses as needed for chest pain. 25 tablet 2   olmesartan (BENICAR) 40 MG tablet TAKE 1 TABLET(40 MG) BY MOUTH DAILY 90 tablet 1   rivaroxaban (XARELTO) 20 MG TABS tablet Take 20 mg by mouth daily with supper.     rivaroxaban (XARELTO) 20 MG TABS tablet Take 1 tablet (20 mg  total) by mouth daily with supper. 30 tablet 11   rivaroxaban (XARELTO) 20 MG TABS tablet Take 1 tablet (20 mg total) by mouth daily with supper. 30 tablet 11   traMADol (ULTRAM) 50 MG tablet Take 50 mg by mouth 3 (three) times daily as needed.     No current facility-administered medications for this visit.    Review of Systems  Constitutional:  Constitutional negative. HENT: HENT negative.  Eyes: Eyes negative.  Respiratory: Respiratory negative.  Cardiovascular: Positive for leg swelling.  GI: Gastrointestinal negative.  GU: Negative for hematuria.  Musculoskeletal: Musculoskeletal negative.  Skin: Skin negative.  Neurological: Neurological negative. Hematologic: Hematologic/lymphatic negative.  Psychiatric: Psychiatric negative.        Objective:  Objective   Vitals:   03/19/22 1028  BP: 127/81  Pulse: 70  Temp: 98 F (36.7 C)  SpO2: 96%  Weight: 266 lb 6.4 oz (120.8 kg)  Height: '6\' 1"'$  (1.854 m)   Body mass index is 35.15 kg/m.  Physical Exam HENT:     Head: Normocephalic.     Nose: Nose normal.     Mouth/Throat:     Mouth: Mucous membranes are moist.  Eyes:     Pupils: Pupils are equal, round, and reactive to light.  Cardiovascular:     Rate and Rhythm: Normal rate and regular rhythm.     Pulses: Normal pulses.  Pulmonary:     Effort: Pulmonary effort is normal.  Abdominal:     General: Abdomen is flat.     Palpations: Abdomen is soft.  Musculoskeletal:     Right lower leg: No edema.     Left lower leg: Edema present.     Comments: Well-healed knee incision  Skin:    Capillary Refill: Capillary refill takes less than 2 seconds.   Neurological:     Mental Status: He is alert.     Data: CT IMPRESSION: VASCULAR   1. Central migration of prior LEFT common iliac vein stent, now embedded/retained within the infrarenal IVC filter. Poor contrast timing, however the stent appears patent. 2. No acute arterial vascular abnormality within the abdomen or pelvis.       Assessment/Plan:     56 year old male with above-noted complex venous history had a filter in place for knee replacement as well as May Thurner stent.  Unfortunately the stent has migrated this appears to have happened silently as it was in place 1 month after her previous procedure by duplex.  I recommended he stay on Xarelto but thankfully the stent is an adequate size match for the vena cava.  We could take the filter out in The stent but I think this puts him at increased risk and he is at much less risk to just be anticoagulated with a filter and stent in place.  I will have him follow-up in 1 year with IVC iliac duplex unless he has issues before this.    Waynetta Sandy MD [facility]

## 2022-04-02 ENCOUNTER — Encounter: Payer: Self-pay | Admitting: Cardiovascular Disease

## 2022-04-02 NOTE — Telephone Encounter (Signed)
Please stop aspirin.

## 2022-06-12 ENCOUNTER — Encounter: Payer: Self-pay | Admitting: Cardiovascular Disease

## 2022-06-12 NOTE — Telephone Encounter (Signed)
Spoke with patient who reports being unsteady and a bit weak/dizzy. So far today, he has had 64 oz of water. He has not eaten for fear of vomiting. At child's doctor appointment with spouse. P.83. No chest pain. He said he will try to eat in a while and will contact clinic if his symptoms do not resolve.

## 2022-06-12 NOTE — Telephone Encounter (Signed)
I agree with his decision to take the extra metoprolol tartrate.  Usually when both heart rate and blood pressure are higher than normal and these are some type of an adrenaline surge that is driving him both higher.  This can be a physical problem such as pain or even an emotional problem such as taking care of a sick kid those.  Most recent vital signs are quite reassuring.  Just keep [place] posted, please.  I do not think I can think of a connection between his current issues and the migrated stent.

## 2022-07-16 ENCOUNTER — Other Ambulatory Visit: Payer: Self-pay | Admitting: Cardiovascular Disease

## 2022-07-16 ENCOUNTER — Encounter: Payer: Self-pay | Admitting: Cardiovascular Disease

## 2022-07-16 DIAGNOSIS — E785 Hyperlipidemia, unspecified: Secondary | ICD-10-CM

## 2022-07-16 DIAGNOSIS — I2510 Atherosclerotic heart disease of native coronary artery without angina pectoris: Secondary | ICD-10-CM

## 2022-07-16 MED ORDER — FUROSEMIDE 40 MG PO TABS: ORAL_TABLET | ORAL | 3 refills | Status: DC

## 2022-07-16 MED ORDER — REPATHA SURECLICK 140 MG/ML ~~LOC~~ SOAJ: SUBCUTANEOUS | 3 refills | Status: DC

## 2022-07-16 MED ORDER — OLMESARTAN MEDOXOMIL 40 MG PO TABS: ORAL_TABLET | ORAL | 3 refills | Status: DC

## 2022-07-16 MED ORDER — CARVEDILOL 12.5 MG PO TABS: ORAL_TABLET | ORAL | 3 refills | Status: DC

## 2022-07-18 ENCOUNTER — Other Ambulatory Visit: Payer: Self-pay | Admitting: Pharmacist

## 2022-07-18 ENCOUNTER — Telehealth: Payer: Self-pay | Admitting: Pharmacist

## 2022-07-18 ENCOUNTER — Telehealth: Payer: Self-pay | Admitting: Cardiovascular Disease

## 2022-07-18 DIAGNOSIS — E785 Hyperlipidemia, unspecified: Secondary | ICD-10-CM

## 2022-07-18 NOTE — Telephone Encounter (Addendum)
Received Repatha PA renewal request. Labs from Oct show baseline lipids, pt states he was off Repatha at the time because he was having issues getting it from the pharmacy. Has been back on med since then. Placed order to recheck lipids which insurance requires before renewing request. Pt states he'll stop by [facility] office at the beginning of next week to complete.  Key on CMM to complete at that time is [id number]

## 2022-07-18 NOTE — Telephone Encounter (Signed)
Already being addressed, see other phone note from today. Pt coming Monday for labs needed for prior auth.

## 2022-07-18 NOTE — Telephone Encounter (Signed)
*  STAT* If patient is at the pharmacy, call can be transferred to refill team.   1. Which medications need to be refilled? (please list name of each medication and dose if known)   Evolocumab (REPATHA SURECLICK) 466 MG/ML SOAJ   2. Which pharmacy/location (including street and city if local pharmacy) is medication to be sent to?  [id number] - [place], [place] - [address] [place] [address] [place] [address]   3. Do they need a 30 day or 90 day supply?   30 day  Caller stated the patient changed insurance and they will need to have the patient's medication approved. Ref# [id number].

## 2022-07-21 ENCOUNTER — Emergency Department (HOSPITAL_BASED_OUTPATIENT_CLINIC_OR_DEPARTMENT_OTHER)
Admission: EM | Admit: 2022-07-21 | Discharge: 2022-07-21 | Disposition: A | Discharge status: Home / Self Care | Payer: BC Managed Care – PPO | Attending: Emergency Medicine | Admitting: Emergency Medicine

## 2022-07-21 ENCOUNTER — Encounter (HOSPITAL_BASED_OUTPATIENT_CLINIC_OR_DEPARTMENT_OTHER): Payer: Self-pay

## 2022-07-21 ENCOUNTER — Other Ambulatory Visit: Payer: Self-pay

## 2022-07-21 DIAGNOSIS — Z79899 Other long term (current) drug therapy: Secondary | ICD-10-CM | POA: Diagnosis not present

## 2022-07-21 DIAGNOSIS — I10 Essential (primary) hypertension: Secondary | ICD-10-CM | POA: Insufficient documentation

## 2022-07-21 DIAGNOSIS — I160 Hypertensive urgency: Secondary | ICD-10-CM | POA: Diagnosis not present

## 2022-07-21 DIAGNOSIS — Z7901 Long term (current) use of anticoagulants: Secondary | ICD-10-CM | POA: Diagnosis not present

## 2022-07-21 DIAGNOSIS — I2510 Atherosclerotic heart disease of native coronary artery without angina pectoris: Secondary | ICD-10-CM | POA: Diagnosis not present

## 2022-07-21 DIAGNOSIS — E876 Hypokalemia: Secondary | ICD-10-CM | POA: Diagnosis not present

## 2022-07-21 LAB — COMPREHENSIVE METABOLIC PANEL
ALT: 15 U/L (ref 0–44)
AST: 16 U/L (ref 15–41)
Albumin: 3.9 g/dL (ref 3.5–5.0)
Alkaline Phosphatase: 86 U/L (ref 38–126)
Anion gap: 14 (ref 5–15)
BUN: 16 mg/dL (ref 6–20)
CO2: 27 mmol/L (ref 22–32)
Calcium: 8.6 mg/dL — ABNORMAL LOW (ref 8.9–10.3)
Chloride: 101 mmol/L (ref 98–111)
Creatinine, Ser: 0.79 mg/dL (ref 0.61–1.24)
GFR, Estimated: >60 mL/min (ref >60)
Glucose, Bld: 171 mg/dL — ABNORMAL HIGH (ref 70–99)
Potassium: 3.2 mmol/L — ABNORMAL LOW (ref 3.5–5.1)
Sodium: 142 mmol/L (ref 135–145)
Total Bilirubin: 0.5 mg/dL (ref 0.3–1.2)
Total Protein: 6.7 g/dL (ref 6.5–8.1)

## 2022-07-21 LAB — CBC
HCT: 41.1 % (ref 39.0–52.0)
Hemoglobin: 14.1 g/dL (ref 13.0–17.0)
MCH: 31.1 pg (ref 26.0–34.0)
MCHC: 34.3 g/dL (ref 30.0–36.0)
MCV: 90.5 fL (ref 80.0–100.0)
Platelets: 237 10*3/uL (ref 150–400)
RBC: 4.54 MIL/uL (ref 4.22–5.81)
RDW: 13.1 % (ref 11.5–15.5)
WBC: 5.5 10*3/uL (ref 4.0–10.5)
nRBC: 0 % (ref 0.0–0.2)

## 2022-07-21 LAB — LIPID PANEL
Cholesterol: 106 mg/dL (ref 0–200)
HDL: 51 mg/dL (ref >40)
LDL Cholesterol: 21 mg/dL (ref 0–99)
Total CHOL/HDL Ratio: 2.1 RATIO
Triglycerides: 169 mg/dL — ABNORMAL HIGH (ref <150)
VLDL: 34 mg/dL (ref 0–40)

## 2022-07-21 LAB — TROPONIN I (HIGH SENSITIVITY): Troponin I (High Sensitivity): 13 ng/L (ref <18)

## 2022-07-21 MED ORDER — POTASSIUM CHLORIDE CRYS ER 20 MEQ PO TBCR: 40.0000 meq | EXTENDED_RELEASE_TABLET | Freq: Once | ORAL | Status: DC

## 2022-07-21 NOTE — ED Provider Notes (Signed)
[place] EMERGENCY DEPT Provider Note   CSN: [id number] Arrival date & time: 07/21/22  0846     History  Chief Complaint  Patient presents with   Hypertension    Dylan [name] is a 56 y.o. male.  HPI 56 year old male presents with hypertension.  Starting 3 nights ago he has noticed elevated blood pressures.  He typically checks his blood pressure at night.  Typically it is in the 140/80 range.  However 3 nights ago it was around 170/90.  Has been having on and off head pressure symptoms and feeling his heartbeat in his head which is a typical sign that he has high blood pressure.  He states he gets this whenever his blood pressure goes high.  He has been taking his meds as directed.  The headache seems to wax and wane.  No visual complaints, vomiting, weakness or numbness in extremities.  No chest pain.  However despite no chest pain he did take a dose of nitroglycerin yesterday afternoon to see if this would help with his blood pressure and head symptoms.  He noted that it dropped his blood pressure into the 90s.  He also had taken Cialis that day as well.  Home Medications Prior to Admission medications   Medication Sig Start Date End Date Taking? Authorizing Provider  acetaminophen (TYLENOL) 325 MG tablet Take 650 mg by mouth daily as needed (pain).    [provider]  carvedilol (COREG) 12.5 MG tablet TAKE 1 TABLET(12.5 MG) BY MOUTH TWICE DAILY 07/16/22   Croitoru, Mihai, MD  Empagliflozin-metFORMIN HCl ER (SYNJARDY XR) 25-1000 MG TB24 Take 1 tablet by mouth every morning.    [provider]  Evolocumab (REPATHA SURECLICK) 476 MG/ML SOAJ ADMINISTER 1 ML UNDER THE SKIN EVERY 14 DAYS 07/16/22   Croitoru, Mihai, MD  furosemide (LASIX) 40 MG tablet TAKE 1 TABLET BY MOUTH EVERY MORNING AND 2 TABLETS EVERY EVENING 07/16/22   Croitoru, Mihai, MD  nitroGLYCERIN (NITROSTAT) 0.4 MG SL tablet Place 1 tablet (0.4 mg total) under the tongue every 5 (five)  minutes x 3 doses as needed for chest pain. 02/12/22   Croitoru, Mihai, MD  olmesartan (BENICAR) 40 MG tablet TAKE 1 TABLET(40 MG) BY MOUTH DAILY 07/16/22   Croitoru, Mihai, MD  rivaroxaban (XARELTO) 20 MG TABS tablet Take 20 mg by mouth daily with supper.    [provider]  rivaroxaban (XARELTO) 20 MG TABS tablet Take 1 tablet (20 mg total) by mouth daily with supper. 03/19/22   Waynetta Sandy, MD  rivaroxaban (XARELTO) 20 MG TABS tablet Take 1 tablet (20 mg total) by mouth daily with supper. 03/19/22   Waynetta Sandy, MD  traMADol (ULTRAM) 50 MG tablet Take 50 mg by mouth 3 (three) times daily as needed. 12/25/21   [provider]      Allergies    Crestor [rosuvastatin], Zofran [ondansetron hcl], Ace inhibitors, Lipitor [atorvastatin], and Codeine    Review of Systems   Review of Systems  Eyes:  Negative for visual disturbance.  Respiratory:  Negative for shortness of breath.   Cardiovascular:  Negative for chest pain.  Gastrointestinal:  Negative for vomiting.  Neurological:  Negative for weakness, numbness and headaches (head pressure, but no pain).    Physical Exam Updated Vital Signs BP (!) 140/79   Pulse 77   Temp 97.9 F (36.6 C) (Oral)   Resp 15   Ht '6\' 1"'$  (1.854 m)   Wt 118.8 kg   SpO2 93%  BMI 34.57 kg/m  Physical Exam Vitals and nursing note reviewed.  Constitutional:      Appearance: He is well-developed.  HENT:     Head: Normocephalic and atraumatic.  Cardiovascular:     Rate and Rhythm: Normal rate and regular rhythm.     Heart sounds: Normal heart sounds.  Pulmonary:     Effort: Pulmonary effort is normal.     Breath sounds: Normal breath sounds.  Abdominal:     Palpations: Abdomen is soft.     Tenderness: There is no abdominal tenderness.  Skin:    General: Skin is warm and dry.  Neurological:     Mental Status: He is alert.     ED Results / Procedures / Treatments   Labs (all labs ordered are listed, but  only abnormal results are displayed) Labs Reviewed  COMPREHENSIVE METABOLIC PANEL - Abnormal; Notable for the following components:      Result Value   Potassium 3.2 (*)    Glucose, Bld 171 (*)    Calcium 8.6 (*)    All other components within normal limits  CBC  LIPID PANEL  TROPONIN I (HIGH SENSITIVITY)    EKG EKG Interpretation  Date/Time:  Monday July 21 2022 09:03:38 EST Ventricular Rate:  79 PR Interval:  134 QRS Duration: 82 QT Interval:  392 QTC Calculation: 449 R Axis:   -5 Text Interpretation: Normal sinus rhythm no acute ST/T changes similar to Sept 2022 Confirmed by Sherwood Gambler [phone]) on 07/21/2022 10:33:38 AM  Radiology No results found.  Procedures Procedures    Medications Ordered in ED Medications - No data to display  ED Course/ Medical Decision Making/ A&P                           Medical Decision Making Amount and/or Complexity of Data Reviewed Independent Historian: spouse External Data Reviewed: notes. Labs: ordered.    Details: Potassium mildly low at 3.2.  Creatinine 0.79.  This is much improved from his most recent creatinine of 1.8 in Care Everywhere. ECG/medicine tests: independent interpretation performed.    Details: No acute ischemia or change from baseline.   Patient has multiple comorbidities which complicate his presentation which includes CAD, hypertension, stroke.  He is on Benicar and carvedilol for his blood pressure and states he has been compliant.  Here he has a benign exam and his blood pressure has improved, at 1 point was 130/80 but now is creeping up to 140/80.  Either way he seems to be asymptomatic currently.  No thunderclap headache at any point or headache today.  He was due to get a lipid panel drawn and his blood work was obtained prior to him eating today so we will send this off so he does not have to make another appointment.  Otherwise, a troponin have been sent in triage but he has no actual chest pain or  shortness of breath or recent chest pain/shortness of breath so I do not think second 1 is needed as I suspect ACS is quite unlikely.  Otherwise, I recommended potassium replacement though he states he is on this at home and will just take it at home.  At this point, I discussed the best plan would be to have him follow-up with his cardiologist, Dr. Sallyanne Kuster, to help smooth out his blood pressure.  We discussed the importance of not taking Cialis and nitroglycerin together, ideally within 48 hours of each other.  He will  be discharged home with return precautions.        Final Clinical Impression(s) / ED Diagnoses Final diagnoses:  Hypertensive urgency  Essential hypertension  Hypokalemia    Rx / DC Orders ED Discharge Orders          Ordered    Ambulatory referral to Cardiology       Comments: If you have not heard from the Cardiology office within the next 72 hours please call [phone].   07/21/22 [place], MD 07/21/22 1100

## 2022-07-21 NOTE — Discharge Instructions (Addendum)
Call Dr. Victorino December office for advice on your blood pressure management.   Take your blood pressure medicines as prescribed.  Make sure you take your potassium pill as your potassium was mildly low today.  Otherwise, if you develop severe headache, chest pain, shortness of breath, or any other new/concerning symptoms and return to the ER for evaluation.

## 2022-07-21 NOTE — ED Triage Notes (Signed)
Pt states his BP has been high at home 175/97 reading took a nitroglycerin pill and dropped his BP to 93/50 after pill to control BP. States he is also having PAC this am.

## 2022-07-21 NOTE — ED Notes (Signed)
Pt states his BP was up this am and he took a nitro and it came down states had NO CP  at all then or now

## 2022-07-22 ENCOUNTER — Encounter: Payer: Self-pay | Admitting: Cardiovascular Disease

## 2022-07-22 NOTE — Telephone Encounter (Signed)
Those labs should be fine for the Repatha Rx. Very important to re-emphasize that he should never take NTG within 3 days of a dose of Cialis. If BP still running high I would advise increasing the carvedilol to 25 mg twice a day.

## 2022-07-22 NOTE — Telephone Encounter (Signed)
Labs have been done. Had to renew PA. Waiting for questions. Pharmacy had called about the PA but when transferred no one was there. Will call once I get answer from insurance.

## 2022-07-24 ENCOUNTER — Other Ambulatory Visit: Payer: Self-pay | Admitting: Pharmacist

## 2022-07-24 DIAGNOSIS — E785 Hyperlipidemia, unspecified: Secondary | ICD-10-CM

## 2022-07-24 DIAGNOSIS — I2510 Atherosclerotic heart disease of native coronary artery without angina pectoris: Secondary | ICD-10-CM

## 2022-07-24 MED ORDER — REPATHA SURECLICK 140 MG/ML ~~LOC~~ SOAJ: SUBCUTANEOUS | 3 refills | Status: DC

## 2022-07-24 NOTE — Telephone Encounter (Signed)
PA approved through 07/24/23 Pt made aware via mychart.

## 2022-07-24 NOTE — Telephone Encounter (Signed)
I called [facility] for a follow up on the PA. They state they do not have a PA on file for pt. They started new KEY [id number]

## 2022-07-25 NOTE — Telephone Encounter (Signed)
Ramond Dial, RPH-CPP      07/24/22 12:21 PM Note PA approved through 07/24/23 Pt made aware via mychart.

## 2022-07-26 ENCOUNTER — Encounter: Payer: Self-pay | Admitting: Cardiovascular Disease

## 2022-07-31 ENCOUNTER — Other Ambulatory Visit: Payer: Self-pay

## 2022-07-31 ENCOUNTER — Encounter (HOSPITAL_BASED_OUTPATIENT_CLINIC_OR_DEPARTMENT_OTHER): Payer: Self-pay | Admitting: Orthopedic Surgery

## 2022-08-07 NOTE — Anesthesia Preprocedure Evaluation (Addendum)
Anesthesia Evaluation  Patient identified by MRN, date of birth, ID band Patient awake    Reviewed: Allergy & Precautions, NPO status , Patient's Chart, lab work & pertinent test results  History of Anesthesia Complications Negative for: history of anesthetic complications  Airway Mallampati: II  TM Distance: >3 FB Neck ROM: Full    Dental  (+) Chipped, Dental Advisory Given, Teeth Intact,    Pulmonary former smoker, PE (IVC filter) 10/07/2021 SARS coronavirus NEG   Pulmonary exam normal breath sounds clear to auscultation       Cardiovascular hypertension, Pt. on home beta blockers and Pt. on medications (-) angina + CAD, + Past MI, + Cardiac Stents and + Peripheral Vascular Disease  Normal cardiovascular exam Rhythm:Regular Rate:Normal  07/2021 ECHO:    1. Left ventricular ejection fraction, by estimation, is 60 to 65%. The  left ventricle has normal function. The left ventricle has no regional  wall motion abnormalities. The left ventricular internal cavity size was  mildly dilated. Left ventricular  diastolic parameters were normal.   2. Right ventricular systolic function is mildly reduced. The right  ventricular size is normal.   3. The mitral valve is normal in structure.   4. Aortic valve regurgitation is not visualized. Aortic valve sclerosis  is present, with no evidence of aortic valve stenosis.   5. Pulmonic valve regurgitation is moderate.   6. The inferior vena cava is normal in size with greater than 50%  respiratory variability, suggesting right atrial pressure of 3 mmHg.      Neuro/Psych CVA (speech difficulty), Residual Symptoms    GI/Hepatic Neg liver ROS,,,H/o small bowel perf   Endo/Other  diabetes, Oral Hypoglycemic Agents    Renal/GU negative Renal ROS   Prostate cancer    Musculoskeletal  (+) Arthritis ,    Abdominal   Peds  Hematology  (+) Blood dyscrasia, anemia Xarelto: last dose  sat 6:30pm   Anesthesia Other Findings   Reproductive/Obstetrics                             Anesthesia Physical Anesthesia Plan  ASA: 3  Anesthesia Plan: General   Post-op Pain Management: Tylenol PO (pre-op)* and Celebrex PO (pre-op)*   Induction:   PONV Risk Score and Plan: 3 and Ondansetron, Treatment may vary due to age or medical condition, Dexamethasone and Midazolam  Airway Management Planned: LMA  Additional Equipment: None  Intra-op Plan:   Post-operative Plan: Extubation in OR  Informed Consent: I have reviewed the patients History and Physical, chart, labs and discussed the procedure including the risks, benefits and alternatives for the proposed anesthesia with the patient or authorized representative who has indicated his/her understanding and acceptance.     Dental advisory given  Plan Discussed with: CRNA  Anesthesia Plan Comments: (See PAT note 09/26/2021)        Anesthesia Quick Evaluation

## 2022-08-08 ENCOUNTER — Other Ambulatory Visit: Payer: Self-pay

## 2022-08-08 ENCOUNTER — Ambulatory Visit (HOSPITAL_BASED_OUTPATIENT_CLINIC_OR_DEPARTMENT_OTHER): Payer: BC Managed Care – PPO | Admitting: Anesthesiology

## 2022-08-08 ENCOUNTER — Encounter (HOSPITAL_BASED_OUTPATIENT_CLINIC_OR_DEPARTMENT_OTHER)
Admission: RE | Disposition: A | Discharge status: Home / Self Care | Payer: Self-pay | Source: Home / Self Care | Attending: Orthopedic Surgery

## 2022-08-08 ENCOUNTER — Encounter (HOSPITAL_BASED_OUTPATIENT_CLINIC_OR_DEPARTMENT_OTHER): Payer: Self-pay | Admitting: Orthopedic Surgery

## 2022-08-08 ENCOUNTER — Ambulatory Visit (HOSPITAL_BASED_OUTPATIENT_CLINIC_OR_DEPARTMENT_OTHER)
Admission: RE | Admit: 2022-08-08 | Discharge: 2022-08-08 | Disposition: A | Discharge status: Home / Self Care | Payer: BC Managed Care – PPO | Attending: Orthopedic Surgery | Admitting: Orthopedic Surgery

## 2022-08-08 DIAGNOSIS — Z79899 Other long term (current) drug therapy: Secondary | ICD-10-CM | POA: Insufficient documentation

## 2022-08-08 DIAGNOSIS — E1151 Type 2 diabetes mellitus with diabetic peripheral angiopathy without gangrene: Secondary | ICD-10-CM | POA: Diagnosis not present

## 2022-08-08 DIAGNOSIS — M659 Synovitis and tenosynovitis, unspecified: Secondary | ICD-10-CM | POA: Insufficient documentation

## 2022-08-08 DIAGNOSIS — I10 Essential (primary) hypertension: Secondary | ICD-10-CM | POA: Insufficient documentation

## 2022-08-08 DIAGNOSIS — Z87891 Personal history of nicotine dependence: Secondary | ICD-10-CM | POA: Diagnosis not present

## 2022-08-08 DIAGNOSIS — I69328 Other speech and language deficits following cerebral infarction: Secondary | ICD-10-CM | POA: Diagnosis not present

## 2022-08-08 DIAGNOSIS — Z01818 Encounter for other preprocedural examination: Secondary | ICD-10-CM

## 2022-08-08 DIAGNOSIS — S83231A Complex tear of medial meniscus, current injury, right knee, initial encounter: Secondary | ICD-10-CM | POA: Diagnosis not present

## 2022-08-08 DIAGNOSIS — Z955 Presence of coronary angioplasty implant and graft: Secondary | ICD-10-CM | POA: Insufficient documentation

## 2022-08-08 DIAGNOSIS — M94261 Chondromalacia, right knee: Secondary | ICD-10-CM | POA: Diagnosis not present

## 2022-08-08 DIAGNOSIS — X58XXXA Exposure to other specified factors, initial encounter: Secondary | ICD-10-CM | POA: Diagnosis not present

## 2022-08-08 DIAGNOSIS — Z86711 Personal history of pulmonary embolism: Secondary | ICD-10-CM | POA: Insufficient documentation

## 2022-08-08 DIAGNOSIS — Z8546 Personal history of malignant neoplasm of prostate: Secondary | ICD-10-CM | POA: Diagnosis not present

## 2022-08-08 DIAGNOSIS — I252 Old myocardial infarction: Secondary | ICD-10-CM | POA: Diagnosis not present

## 2022-08-08 DIAGNOSIS — Z7984 Long term (current) use of oral hypoglycemic drugs: Secondary | ICD-10-CM | POA: Insufficient documentation

## 2022-08-08 DIAGNOSIS — M1990 Unspecified osteoarthritis, unspecified site: Secondary | ICD-10-CM | POA: Diagnosis not present

## 2022-08-08 DIAGNOSIS — Z7901 Long term (current) use of anticoagulants: Secondary | ICD-10-CM | POA: Diagnosis not present

## 2022-08-08 DIAGNOSIS — I2510 Atherosclerotic heart disease of native coronary artery without angina pectoris: Secondary | ICD-10-CM | POA: Insufficient documentation

## 2022-08-08 HISTORY — PX: KNEE ARTHROSCOPY WITH MEDIAL MENISECTOMY: SHX5651

## 2022-08-08 LAB — GLUCOSE, CAPILLARY
Glucose-Capillary: 140 mg/dL — ABNORMAL HIGH (ref 70–99)
Glucose-Capillary: 165 mg/dL — ABNORMAL HIGH (ref 70–99)

## 2022-08-08 SURGERY — ARTHROSCOPY, KNEE, WITH MEDIAL MENISCECTOMY
Anesthesia: General | Site: Knee | Laterality: Right

## 2022-08-08 MED ORDER — HYDROMORPHONE HCL 1 MG/ML IJ SOLN: 0.2500 mg | INTRAMUSCULAR | Status: DC | PRN

## 2022-08-08 MED ORDER — MIDAZOLAM HCL 2 MG/2ML IJ SOLN: 2.0000 mg | Freq: Once | INTRAMUSCULAR | Status: DC

## 2022-08-08 MED ORDER — ACETAMINOPHEN 500 MG PO TABS
ORAL_TABLET | ORAL | Status: AC
  Filled 2022-08-08: qty 2

## 2022-08-08 MED ORDER — AMISULPRIDE (ANTIEMETIC) 5 MG/2ML IV SOLN: 10.0000 mg | Freq: Once | INTRAVENOUS | Status: DC | PRN

## 2022-08-08 MED ORDER — PHENYLEPHRINE HCL (PRESSORS) 10 MG/ML IV SOLN
INTRAVENOUS | Status: DC | PRN
  Administered 2022-08-08 (×3): 80 ug via INTRAVENOUS

## 2022-08-08 MED ORDER — SODIUM CHLORIDE 0.9 % IR SOLN
Status: DC | PRN
  Administered 2022-08-08: 6000 mL

## 2022-08-08 MED ORDER — MEPERIDINE HCL 25 MG/ML IJ SOLN: 6.2500 mg | INTRAMUSCULAR | Status: DC | PRN

## 2022-08-08 MED ORDER — HYDROCODONE-ACETAMINOPHEN 5-325 MG PO TABS: 1.0000 | ORAL_TABLET | Freq: Four times a day (QID) | ORAL | 0 refills | Status: DC | PRN

## 2022-08-08 MED ORDER — BUPIVACAINE HCL 0.25 % IJ SOLN
INTRAMUSCULAR | Status: DC | PRN
  Administered 2022-08-08: 20 mL

## 2022-08-08 MED ORDER — LIDOCAINE 2% (20 MG/ML) 5 ML SYRINGE: INTRAMUSCULAR | Status: DC | PRN

## 2022-08-08 MED ORDER — PROPOFOL 10 MG/ML IV BOLUS
INTRAVENOUS | Status: DC | PRN
  Administered 2022-08-08: 200 mg via INTRAVENOUS

## 2022-08-08 MED ORDER — FENTANYL CITRATE (PF) 100 MCG/2ML IJ SOLN
INTRAMUSCULAR | Status: AC
  Filled 2022-08-08: qty 2

## 2022-08-08 MED ORDER — CEFAZOLIN SODIUM-DEXTROSE 2-4 GM/100ML-% IV SOLN
2.0000 g | INTRAVENOUS | Status: AC
  Administered 2022-08-08: 3 g via INTRAVENOUS

## 2022-08-08 MED ORDER — CELECOXIB 200 MG PO CAPS
200.0000 mg | ORAL_CAPSULE | Freq: Once | ORAL | Status: AC
  Administered 2022-08-08: 200 mg via ORAL

## 2022-08-08 MED ORDER — MIDAZOLAM HCL 2 MG/2ML IJ SOLN
INTRAMUSCULAR | Status: AC
  Filled 2022-08-08: qty 2

## 2022-08-08 MED ORDER — ACETAMINOPHEN 500 MG PO TABS
1000.0000 mg | ORAL_TABLET | Freq: Once | ORAL | Status: AC
  Administered 2022-08-08: 1000 mg via ORAL

## 2022-08-08 MED ORDER — CEFAZOLIN SODIUM-DEXTROSE 2-4 GM/100ML-% IV SOLN
INTRAVENOUS | Status: AC
  Filled 2022-08-08: qty 100

## 2022-08-08 MED ORDER — CELECOXIB 200 MG PO CAPS
ORAL_CAPSULE | ORAL | Status: AC
  Filled 2022-08-08: qty 1

## 2022-08-08 MED ORDER — LACTATED RINGERS IV SOLN: INTRAVENOUS | Status: DC

## 2022-08-08 MED ORDER — PROPOFOL 500 MG/50ML IV EMUL
INTRAVENOUS | Status: AC
  Filled 2022-08-08: qty 50

## 2022-08-08 MED ORDER — CEFAZOLIN SODIUM-DEXTROSE 1-4 GM/50ML-% IV SOLN
INTRAVENOUS | Status: AC
  Filled 2022-08-08: qty 50

## 2022-08-08 MED ORDER — OXYCODONE HCL 5 MG/5ML PO SOLN: 5.0000 mg | Freq: Once | ORAL | Status: DC | PRN

## 2022-08-08 MED ORDER — PROMETHAZINE HCL 12.5 MG PO TABS: 12.5000 mg | ORAL_TABLET | Freq: Three times a day (TID) | ORAL | 0 refills | Status: DC | PRN

## 2022-08-08 MED ORDER — LIDOCAINE 2% (20 MG/ML) 5 ML SYRINGE
INTRAMUSCULAR | Status: AC
  Filled 2022-08-08: qty 5

## 2022-08-08 MED ORDER — FENTANYL CITRATE (PF) 100 MCG/2ML IJ SOLN
INTRAMUSCULAR | Status: DC | PRN
  Administered 2022-08-08 (×2): 50 ug via INTRAVENOUS

## 2022-08-08 MED ORDER — DEXAMETHASONE SODIUM PHOSPHATE 10 MG/ML IJ SOLN
INTRAMUSCULAR | Status: AC
  Filled 2022-08-08: qty 1

## 2022-08-08 MED ORDER — ONDANSETRON HCL 4 MG/2ML IJ SOLN: INTRAMUSCULAR | Status: DC | PRN

## 2022-08-08 MED ORDER — DEXAMETHASONE SODIUM PHOSPHATE 10 MG/ML IJ SOLN
INTRAMUSCULAR | Status: DC | PRN
  Administered 2022-08-08: 4 mg via INTRAVENOUS

## 2022-08-08 MED ORDER — PROMETHAZINE HCL 25 MG/ML IJ SOLN: 6.2500 mg | INTRAMUSCULAR | Status: DC | PRN

## 2022-08-08 MED ORDER — BUPIVACAINE HCL (PF) 0.25 % IJ SOLN
INTRAMUSCULAR | Status: AC
  Filled 2022-08-08: qty 30

## 2022-08-08 MED ORDER — OXYCODONE HCL 5 MG PO TABS: 5.0000 mg | ORAL_TABLET | Freq: Once | ORAL | Status: DC | PRN

## 2022-08-08 MED ORDER — MIDAZOLAM HCL 5 MG/5ML IJ SOLN
INTRAMUSCULAR | Status: DC | PRN
  Administered 2022-08-08: 2 mg via INTRAVENOUS

## 2022-08-08 MED ORDER — FENTANYL CITRATE (PF) 100 MCG/2ML IJ SOLN: 100.0000 ug | Freq: Once | INTRAMUSCULAR | Status: DC

## 2022-08-08 SURGICAL SUPPLY — 44 items
BANDAGE ESMARK 6X9 LF (GAUZE/BANDAGES/DRESSINGS) IMPLANT
BLADE SHAVER TORPEDO 4X13 (MISCELLANEOUS) IMPLANT
BLADE SURG 10 STRL SS (BLADE) ×1 IMPLANT
BLADE SURG 15 STRL LF DISP TIS (BLADE) ×1 IMPLANT
BLADE SURG 15 STRL SS (BLADE) ×1
BNDG CMPR 9X6 STRL LF SNTH (GAUZE/BANDAGES/DRESSINGS)
BNDG ELASTIC 6X5.8 VLCR STR LF (GAUZE/BANDAGES/DRESSINGS) ×1 IMPLANT
BNDG ESMARK 6X9 LF (GAUZE/BANDAGES/DRESSINGS)
BUR OVAL CARBIDE 4.0 (BURR) IMPLANT
BURR OVAL 8 FLU 4.0X13 (MISCELLANEOUS) IMPLANT
BURR OVAL 8 FLU 5.0X13 (MISCELLANEOUS) IMPLANT
CUFF TOURN SGL QUICK 34 (TOURNIQUET CUFF) ×1
CUFF TRNQT CYL 34X4.125X (TOURNIQUET CUFF) ×1 IMPLANT
CUTTER BONE 4.0MM X 13CM (MISCELLANEOUS) ×1 IMPLANT
DRAPE INCISE IOBAN 66X45 STRL (DRAPES) IMPLANT
DRAPE U-SHAPE 47X51 STRL (DRAPES) ×1 IMPLANT
DRAPE-T ARTHROSCOPY W/POUCH (DRAPES) ×1 IMPLANT
DURAPREP 26ML APPLICATOR (WOUND CARE) ×1 IMPLANT
ELECT REM PT RETURN 9FT ADLT (ELECTROSURGICAL)
ELECTRODE REM PT RTRN 9FT ADLT (ELECTROSURGICAL) IMPLANT
GAUZE PAD ABD 8X10 STRL (GAUZE/BANDAGES/DRESSINGS) ×1 IMPLANT
GAUZE SPONGE 4X4 12PLY STRL (GAUZE/BANDAGES/DRESSINGS) ×1 IMPLANT
GAUZE XEROFORM 1X8 LF (GAUZE/BANDAGES/DRESSINGS) ×1 IMPLANT
GLOVE BIO SURGEON STRL SZ7.5 (GLOVE) ×2 IMPLANT
GLOVE INDICATOR 8.0 STRL GRN (GLOVE) ×2 IMPLANT
GOWN STRL REUS W/ TWL LRG LVL3 (GOWN DISPOSABLE) ×2 IMPLANT
GOWN STRL REUS W/ TWL XL LVL3 (GOWN DISPOSABLE) ×1 IMPLANT
GOWN STRL REUS W/TWL LRG LVL3 (GOWN DISPOSABLE) ×2
GOWN STRL REUS W/TWL XL LVL3 (GOWN DISPOSABLE) ×1
KIT BASIN OR (CUSTOM PROCEDURE TRAY) ×1 IMPLANT
MANIFOLD NEPTUNE II (INSTRUMENTS) ×1 IMPLANT
PACK ARTHROSCOPY DSU (CUSTOM PROCEDURE TRAY) ×1 IMPLANT
PENCIL SMOKE EVACUATOR (MISCELLANEOUS) IMPLANT
PORT APPOLLO RF 90DEGREE MULTI (SURGICAL WAND) IMPLANT
PROBE HOOK APOLLO (SURGICAL WAND) IMPLANT
SHEET MEDIUM DRAPE 40X70 STRL (DRAPES) ×1 IMPLANT
STRIP CLOSURE SKIN 1/2X4 (GAUZE/BANDAGES/DRESSINGS) ×1 IMPLANT
SUT ETHILON 4 0 PS 2 18 (SUTURE) ×1 IMPLANT
SUT MNCRL AB 3-0 PS2 18 (SUTURE) ×1 IMPLANT
SUT VICRYL 0 CT-2 (SUTURE) ×1 IMPLANT
SYR CONTROL 10ML LL (SYRINGE) IMPLANT
TOWEL GREEN STERILE FF (TOWEL DISPOSABLE) ×1 IMPLANT
TUBE CONNECTING 20X1/4 (TUBING) ×1 IMPLANT
TUBING ARTHROSCOPY IRRIG 16FT (MISCELLANEOUS) ×1 IMPLANT

## 2022-08-08 NOTE — Discharge Instructions (Addendum)
Post-operative patient instructions  Knee Arthroscopy   Ice:  Place intermittent ice or cooler pack over your knee, 30 minutes on and 30 minutes off.  Continue this for the first 72 hours after surgery, then save ice for use after therapy sessions or on more active days.   Weight:  You may bear weight on your leg as your symptoms allow. DVT prevention: Perform ankle pumps as able throughout the day on the operative extremity.  Be mobile as possible with ambulation as able.  You should also take an 81 mg aspirin once per day x6 weeks. Crutches:  Use crutches (or walker) to assist in walking until told to discontinue by your physical therapist or physician. This will help to reduce pain. Strengthening:  Perform simple thigh squeezes (isometric quad contractions) and straight leg lifts as you are able (3 sets of 5 to 10 repetitions, 3 times a day).  For the leg lifts, have someone support under your ankle in the beginning until you have increased strength enough to do this on your own.  To help get started on thigh squeezes, place a pillow under your knee and push down on the pillow with back of knee (sometimes easier to do than with your leg fully straight). Motion:  Perform gentle knee motion as tolerated - this is gentle bending and straightening of the knee. Seated heel slides: you can start by sitting in a chair, remove your brace, and gently slide your heel back on the floor - allowing your knee to bend. Have someone help you straighten your knee (or use your other leg/foot hooked under your ankle.  Dressing:  Perform 1st dressing change at 3 days postoperative. A moderate amount of blood tinged drainage is to be expected.  So if you bleed through the dressing on the first or second day or if you have fevers, it is fine to change the dressing/check the wounds early and redress wound. Elevate your leg.  If it bleeds through again, or if the incisions are leaking frank blood, please call the office. May  change dressing every 1-2 days thereafter to help watch wounds. Can purchase Tegderm (or 60M Nexcare) water resistant dressings at local pharmacy / [facility]. Shower:  shower is ok after 3 days.  Please take shower, NO bath. Recover with gauze and ace wrap to help keep wounds protected.   Pain medication:  A narcotic pain medication has been prescribed.  Take as directed.  Typically you need narcotic pain medication more regularly during the first 3 to 5 days after surgery.  Decrease your use of the medication as the pain improves.  Narcotics can sometimes cause constipation, even after a few doses.  If you have problems with constipation, you can take an over the counter stool softener or light laxative.  If you have persistent problems, please notify your physician's office. Physical therapy: Additional activity guidelines to be provided by your physician or physical therapist at follow-up visits.  Driving: You may drive as soon as you are off of narcotics or not requiring them on a scheduled basis. Call [phone] for questions or problems. Evenings you will be forwarded to the hospital operator.  Ask for the orthopaedic physician on call. Please call if you experience:    Redness, foul smelling, or persistent drainage from the surgical site  worsening knee pain and swelling not responsive to medication  any calf pain and or swelling of the lower leg  temperatures greater than 101.5 F other questions or concerns  Thank you for allowing [place] to be a part of your care   Post Anesthesia Home Care Instructions  Activity: Get plenty of rest for the remainder of the day. A responsible individual must stay with you for 24 hours following the procedure.  For the next 24 hours, DO NOT: -Drive a car -Paediatric nurse -Drink alcoholic beverages -Take any medication unless instructed by your physician -Make any legal decisions or sign important papers.  Meals: Start with liquid foods such as  gelatin or soup. Progress to regular foods as tolerated. Avoid greasy, spicy, heavy foods. If nausea and/or vomiting occur, drink only clear liquids until the nausea and/or vomiting subsides. Call your physician if vomiting continues.  Special Instructions/Symptoms: Your throat may feel dry or sore from the anesthesia or the breathing tube placed in your throat during surgery. If this causes discomfort, gargle with warm salt water. The discomfort should disappear within 24 hours.  May take Tylenol after 2:45pm if needed.

## 2022-08-08 NOTE — Anesthesia Procedure Notes (Signed)
Procedure Name: LMA Insertion Date/Time: 08/08/2022 9:22 AM  Performed by: Maryella Shivers, CRNAPre-anesthesia Checklist: Patient identified, Emergency Drugs available, Suction available and Patient being monitored Patient Re-evaluated:Patient Re-evaluated prior to induction Oxygen Delivery Method: Circle system utilized Preoxygenation: Pre-oxygenation with 100% oxygen Induction Type: IV induction Ventilation: Mask ventilation without difficulty LMA: LMA inserted LMA Size: 5.0 Number of attempts: 1 Airway Equipment and Method: Bite block Placement Confirmation: positive ETCO2 Tube secured with: Tape Dental Injury: Teeth and Oropharynx as per pre-operative assessment

## 2022-08-08 NOTE — H&P (Signed)
ORTHOPAEDIC H&P  REQUESTING PHYSICIAN: Nicholes Stairs, MD  PCP:  [name], PA-C  Chief Complaint: Right knee pain  HPI: Dylan [name] is a 57 y.o. male who complains of right knee pain including catching and buckling as well as swelling over the last few months.  He was noted to have an unstable medial meniscus tear of the right knee.  Here today for arthroscopic assisted partial medial meniscectomy.  No new complaints at this time.  Past Medical History:  Diagnosis Date   Arthritis    CAD (coronary artery disease) 07/2010; 07/2012; 08/2014   cardiologist--- dr croitoru;  1) 11-28-'11: NSTEMI - 100% RCA - PCI (3 Taxus ION DES 3.0 x 28, 3.0 x 24 & 3.5 x 12 distal-prox); b) 12/ 2013 ISR of RCA stent - -AnngioSculpt PTCA; c) NSTEMI 1/'16: mild ISR with Thrombosis of RCA Stents (in setting of Mesenteric Vein Thrombosis) - Cutting Balloon PTCA; c. Re-look Cath 07/2015 & 07/2016: ~15% ISR in RCA,20% p-mLAD & o-pCx.   Chronic gout    01-07-2021  last flare-up approxl 12-17-2020 bilteral great toes   DM type 2 (diabetes mellitus, type 2) ([place])    followed by pcp   (01-07-2021 per pt does not check blood sugar at home)   Edema of both lower extremities    01-07-2021  pt states wears compression hose   Essential hypertension    followed by pcp and cardiology   History of concussion    01-07-2021  per pt x4 as child, last one age 26 without residual   History of CVA with residual deficit 55/97/4163   embolic left MCA infarct post PCI stenting approx one hour  (01-07-2021 per pt has residual minimal intermittant aphasia )   History of non-ST elevation myocardial infarction (NSTEMI) 07-01-2010  and 01/ 2016   History of prostatitis    History of resection of small bowel 08/28/2014   small bowel perforation,  s/p resction    History of retinal tear 04/2013   right micro-rupture , no occlusion, retinal artery branch w/ vision loss,  per imaging no infarct/ occlusion    History of thrombosis 08/2014   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, provoked by testosterone injection,  08-09-2014 s/p thrombolytic lysis VIR    Hyperlipidemia with target LDL less than 70 09/01/2014   May-Thurner syndrome    Myocardial infarction ([place]) 2011   and 2016   Nocturia    PAC (premature atrial contraction)    pt hx palpitations, event monitor 08-18-2017  showed NS w/ occasional PACs, rare PVCs   Peripheral vascular disease ([place])    Prostate cancer [facility]) urologist-- dr herrick/  oncologist-- dr Tammi Klippel   dx 09-04-2020,  Stage T1c, Gleason 4+3    S/P drug eluting coronary stent placement 07/01/2010   DES x3  to prox and distal RCA   S/P percutaneous transluminal coronary angioplasty 12/ 2013 and 01/ 2016   for ISR  RCA   Stroke ([place]) 2011   some residual aphasia   Past Surgical History:  Procedure Laterality Date   BOWEL RESECTION N/A 08/28/2014   Procedure: SMALL BOWEL RESECTION;  Surgeon: [name], MD;  Location: [place];  Service: General;  Laterality: N/A;   CARDIAC CATHETERIZATION N/A 07/20/2015   Procedure: Left Heart Cath and Coronary Angiography;  Surgeon: [name], MD;  Location: [place] [facility];  Service: Cardiovascular;  15% ISR in the RCA stent segment, 20% proximal-mid LAD as well  as ostial and proximal circumflex. Normal LV function.   CARDIAC CATHETERIZATION N/A 07/23/2016   Procedure: Left Heart Cath and Coronary Angiography;  Surgeon: [name], MD;  Location: [place] [facility];  Service: Cardiovascular: E 55-65%. ~15% diffuse RCA ISR, ~20% diffuse pLAD & pCx   CORONARY ANGIOPLASTY  07/06/2012; 08/15/2014   a) 12/'13: PTCA of 80% ISR mRCA - AngioSculpt; b) PTCA of  ISR/thrombosis   CORONARY ANGIOPLASTY WITH STENT PLACEMENT  07-01-2010  dr harding '@[facility]'$    inferior wall MI - 3 Taxus Ion DES (3.0x71m, 3.0x246m 3.5x1242mto prox and mid RCA   CYSTOSCOPY N/A 01/10/2021   Procedure: CYSTOSCOPY FLEXIBLE;  Surgeon: HerArdis HughsD;  [facility];  Laterality: N/A;   INCISIONAL HERNIA REPAIR N/A 10/15/2017   Procedure: HERNIA REPAIR INCISIONAL;  Surgeon: BlaCoralie KeensD;  [facility];  Service: General;  Laterality: N/A;   INGUINAL HERNIA REPAIR Bilateral 05/2010   INSERTION OF MESH N/A 10/15/2017   Procedure: INSERTION OF MESH;  Surgeon: BlaCoralie KeensD;  [facility];  Service: General;  Laterality: N/A;   INTRAVASCULAR ULTRASOUND/IVUS Left 04/29/2021   Procedure: Intravascular Ultrasound/IVUS;  Surgeon: CaiWaynetta SandyD;  [facility];  Service: Cardiovascular;  Laterality: Left;   IVC FILTER INSERTION N/A 08/19/2021   Procedure: IVC FILTER INSERTION;  Surgeon: CaiWaynetta SandyD;  [facility] [place] [facility];  Service: Cardiovascular;  Laterality: N/A;   KNEE ARTHROPLASTY Left 10/09/2021   Procedure: COMPUTER ASSISTED TOTAL KNEE ARTHROPLASTY;  Surgeon: SwiRod CanD;  Location: [facility];  Service: Orthopedics;  Laterality: Left;  150   KNEE ARTHROSCOPY W/ MENISCAL REPAIR Left 06/2011   LAPAROSCOPIC APPENDECTOMY N/A 08/28/2014   Procedure: APPENDECTOMY LAPAROSCOPIC;  Surgeon: DouCoralie KeensD;  [facility];  Laterality: N/A;   LAPAROSCOPY  08/28/2014   Procedure: LAPAROSCOPY DIAGNOSTIC;  Surgeon: DouCoralie KeensD;  [facility];;   LEFT HEART CATHETERIZATION WITH CORONARY ANGIOGRAM N/A 06/11/2012   Procedure: LEFT HEART CATHETERIZATION WITH CORONARY ANGIOGRAM;  Surgeon: MihSanda KleinD;  [facility];  Service: Cardiovascular;  Laterality: N/A;   LEFT HEART CATHETERIZATION WITH CORONARY ANGIOGRAM N/A 04/18/2013   Procedure: LEFT HEART CATHETERIZATION WITH CORONARY ANGIOGRAM;  Surgeon: ThoTroy SineD;  [facility];  Service: Cardiovascular;  Laterality: N/A;   LEFT HEART CATHETERIZATION WITH CORONARY ANGIOGRAM N/A 08/15/2014    Procedure: LEFT HEART CATHETERIZATION WITH CORONARY ANGIOGRAM;  Surgeon: DavLeonie ManD;  [facility];  Service: Cardiovascular;  Laterality: N/A;   LOWER EXTREMITY VENOGRAPHY Left 04/29/2021   Procedure: LOWER EXTREMITY VENOGRAPHY;  Surgeon: CaiWaynetta SandyD;  [facility] [place] [facility];  Service: Cardiovascular;  Laterality: Left;   LOWER EXTREMITY VENOGRAPHY Left 08/19/2021   Procedure: LOWER EXTREMITY VENOGRAPHY;  Surgeon: CaiWaynetta SandyD;  [facility] [place] [facility];  Service: Cardiovascular;  Laterality: Left;   PERCUTANEOUS CORONARY STENT INTERVENTION (PCI-S) N/A 07/06/2012   Procedure: PERCUTANEOUS CORONARY STENT INTERVENTION (PCI-S);  Surgeon: JonLorretta HarpD;  [facility];  Service: Cardiovascular;  Laterality: N/A;   PERIPHERAL VASCULAR INTERVENTION Left 08/19/2021   Procedure: PERIPHERAL VASCULAR INTERVENTION;  Surgeon: CaiWaynetta SandyD;  [facility] [place] [facility];  Service: Cardiovascular;  Laterality: Left;  Common Iliac Venous   PERIPHERAL VASCULAR THROMBECTOMY Left 04/29/2021   Procedure: PERIPHERAL VASCULAR THROMBECTOMY;  Surgeon: CaiWaynetta Sandy  MD;  Location: [place] [facility];  Service: Cardiovascular;  Laterality: Left;   RADIOACTIVE SEED IMPLANT N/A 01/10/2021   Procedure: RADIOACTIVE SEED IMPLANT/BRACHYTHERAPY IMPLANT;  Surgeon: Ardis Hughs, MD;  Location: [facility];  Service: Urology;  Laterality: N/A;   SEPTOPLASTY  11-01-2001 @ [place]   SPACE OAR INSTILLATION N/A 01/10/2021   Procedure: SPACE OAR INSTILLATION;  Surgeon: Ardis Hughs, MD;  [facility];  Service: Urology;  Laterality: N/A;   TONSILLECTOMY AND ADENOIDECTOMY  age 4   TRANSTHORACIC ECHOCARDIOGRAM  08/15/2014   Moderate concentric LVH. EF 60-65% with no regional WMA. Gr 1 DD, otherwise normal   Social History   Socioeconomic History   Marital status: Married    Spouse name:  Curt Bears   Number of children: 4   Years of education: 12   Highest education level: Not on file  Occupational History   Occupation: Pharmacologist: [employer]  Tobacco Use   Smoking status: Former    Packs/day: 1.00    Years: 15.00    Total pack years: 15.00    Types: Cigarettes    Quit date: 06/30/2010    Years since quitting: 12.1   Smokeless tobacco: Current    Types: Snuff  Vaping Use   Vaping Use: Never used  Substance and Sexual Activity   Alcohol use: Yes    Alcohol/week: 7.0 - 14.0 standard drinks of alcohol    Types: 7 - 14 Shots of liquor per week    Comment: 1-2 bourbon on the rocks per day   Drug use: No   Sexual activity: Yes    Birth control/protection: Surgical    Comment: vasectomy  Other Topics Concern   Not on file  Social History Narrative   Not on file   Social Determinants of Health   Financial Resource Strain: Low Risk  (06/13/2021)   Overall Financial Resource Strain (CARDIA)    Difficulty of Paying Living Expenses: Not hard at all  Food Insecurity: No Food Insecurity (06/13/2021)   Hunger Vital Sign    Worried About Running Out of Food in the Last Year: Never true    Ran Out of Food in the Last Year: Never true  Transportation Needs: No Transportation Needs (06/13/2021)   PRAPARE - Hydrologist (Medical): No    Lack of Transportation (Non-Medical): No  Physical Activity: Insufficiently Active (06/13/2021)   Exercise Vital Sign    Days of Exercise per Week: 2 days    Minutes of Exercise per Session: 30 min  Stress: No Stress Concern Present (06/13/2021)   [place]    Feeling of Stress : Not at all  Social Connections: Moderately Isolated (06/13/2021)   Social Connection and Isolation Panel [NHANES]    Frequency of Communication with Friends and Family: More than three times a week    Frequency of Social Gatherings with Friends and  Family: More than three times a week    Attends Religious Services: Never    Marine scientist or Organizations: No    Attends Music therapist: Never    Marital Status: Married   Family History  Problem Relation Age of Onset   Atrial fibrillation Mother    Mitral valve prolapse Mother    Coronary artery disease Father        CABG, aortic aneursym,   Heart attack Father    Heart  attack Paternal Uncle    Heart attack Paternal Grandfather    Clotting disorder Neg Hx    Allergies  Allergen Reactions   Crestor [Rosuvastatin] Nausea Only and Other (See Comments)    Severe muscle pain and nausea   Zofran [Ondansetron Hcl] Nausea And Vomiting   Ace Inhibitors Cough   Lipitor [Atorvastatin] Other (See Comments)    myalgia   Codeine Itching   Prior to Admission medications   Medication Sig Start Date End Date Taking? Authorizing Provider  carvedilol (COREG) 12.5 MG tablet TAKE 1 TABLET(12.5 MG) BY MOUTH TWICE DAILY 07/16/22  Yes Croitoru, Mihai, MD  Empagliflozin-metFORMIN HCl ER (SYNJARDY XR) 25-1000 MG TB24 Take 1 tablet by mouth every morning.   Yes [provider]  Evolocumab (REPATHA SURECLICK) 916 MG/ML SOAJ ADMINISTER 1 ML UNDER THE SKIN EVERY 14 DAYS 07/24/22  Yes Croitoru, Mihai, MD  furosemide (LASIX) 40 MG tablet TAKE 1 TABLET BY MOUTH EVERY MORNING AND 2 TABLETS EVERY EVENING 07/16/22  Yes Croitoru, Mihai, MD  olmesartan (BENICAR) 40 MG tablet TAKE 1 TABLET(40 MG) BY MOUTH DAILY 07/16/22  Yes Croitoru, Mihai, MD  rivaroxaban (XARELTO) 20 MG TABS tablet Take 20 mg by mouth daily with supper.   Yes [provider]  acetaminophen (TYLENOL) 325 MG tablet Take 650 mg by mouth daily as needed (pain).    [provider]  nitroGLYCERIN (NITROSTAT) 0.4 MG SL tablet Place 1 tablet (0.4 mg total) under the tongue every 5 (five) minutes x 3 doses as needed for chest pain. 02/12/22   Croitoru, Mihai, MD  rivaroxaban (XARELTO) 20 MG TABS tablet  Take 1 tablet (20 mg total) by mouth daily with supper. 03/19/22   Waynetta Sandy, MD  rivaroxaban (XARELTO) 20 MG TABS tablet Take 1 tablet (20 mg total) by mouth daily with supper. 03/19/22   Waynetta Sandy, MD  traMADol (ULTRAM) 50 MG tablet Take 50 mg by mouth 3 (three) times daily as needed. 12/25/21   [provider]   No results found.  Positive ROS: All other systems have been reviewed and were otherwise negative with the exception of those mentioned in the HPI and as above.  Physical Exam: General: Alert, no acute distress Cardiovascular: No pedal edema Respiratory: No cyanosis, no use of accessory musculature GI: No organomegaly, abdomen is soft and non-tender Skin: No lesions in the area of chief complaint Neurologic: Sensation intact distally Psychiatric: Patient is competent for consent with normal mood and affect Lymphatic: No axillary or cervical lymphadenopathy  MUSCULOSKELETAL: Right lower extremity is warm and well-perfused.  1+ edema down in the mid tibia distal towards the dorsum of the foot.  Otherwise neurovascular intact.  Assessment: 1.  Right knee medial meniscus tear, initial encounter.  Plan: Plan to proceed today with arthroscopic assisted partial medial meniscectomy.  We again discussed the risk and benefits of the procedure including but not limited to bleeding, infection, damage to surrounding nerves and vessels, stiffness, persistent pain, recurrent tear, DVT, as well as the risk of anesthesia.  He has provided informed consent.  Plan for discharge home postop from PACU.    Nicholes Stairs, MD Cell [phone]    08/08/2022 9:02 AM

## 2022-08-08 NOTE — Op Note (Signed)
Surgery Date: 08/08/2022  Surgeon(s): Nicholes Stairs, MD  Assistant: Jonelle Sidle, PA-C  Assistant attestation: PA Thereasa Solo was present for the entire procedure.  Participated in all portions.  ANESTHESIA:  general, with local  FLUIDS: Per anesthesia record.   ESTIMATED BLOOD LOSS: minimal  PREOPERATIVE DIAGNOSES:  1.  Right knee complex medial meniscus tear 2.  knee synovitis  POSTOPERATIVE DIAGNOSES:  1.  Right knee complex medial meniscus tear 2.  Right knee synovitis 3.  Right knee grade III chondromalacia medial femoral condyle  PROCEDURES PERFORMED:  1.  Right knee arthroscopy with limited synovectomy 2.  Right knee arthroscopy with arthroscopic partial medial meniscectomy 3.  Right knee arthroscopy with arthroscopic chondroplasty medial femoral condyle   DESCRIPTION OF PROCEDURE: [name] is a 57 y.o.-year-old male with right knee medial meniscus tear. Plans are to proceed with partial medial meniscectomy and diagnostic arthroscopy with debridement as indicated. Full discussion held regarding risks benefits alternatives and complications related surgical intervention. Conservative care options reviewed. All questions answered.  The patient was identified in the preoperative holding area and the operative extremity was marked. The patient was brought to the operating room and transferred to operating table in a supine position. Satisfactory general anesthesia was induced by anesthesiology.    Standard anterolateral, anteromedial arthroscopy portals were obtained. The anteromedial portal was obtained with a spinal needle for localization under direct visualization with subsequent diagnostic findings.   Anteromedial and anterolateral chambers: moderate synovitis. The synovitis was debrided with a 4.5 mm full radius shaver through both the anteromedial and lateral portals.   Suprapatellar pouch and gutters: moderate synovitis or debris. Patella chondral surface:  Grade 1 Trochlear chondral surface: Grade 1 Patellofemoral tracking: Midline and level Medial meniscus: Complex tearing of the medial meniscus mid body and posterior horn.  There was a horizontal tearing of the mid body with unstable tibial sided leaflet.  Posteriorly there was a partial radial tear with a flipped fragment on the superior aspect into the intercondylar notch otherwise the majority of the root was intact..  Medial femoral condyle flexion bearing surface: Grade 3 Medial femoral condyle extension bearing surface: Grade 0 Medial tibial plateau: Grade 0 Anterior cruciate ligament:stable Posterior cruciate ligament:stable Lateral meniscus: Intact.   Lateral femoral condyle flexion bearing surface: Grade 0 Lateral femoral condyle extension bearing surface: Grade 0 Lateral tibial plateau: Grade 0  Partial arthroscopic medial meniscectomy is carried out with combination of meniscal biter as well as motorized shaver.  We resected the inferior leaflet of the mid body horizontal tear in the superior aspect of the flipped fragment in the intercondylar notch.  Total resected surface area about 20%.  Chondroplasty was undertaken with combination of motorized shaver and arthroscopic biter.  The medial femoral condyle was smoothed to a stable borders of this chondromalacia.  After completion of synovectomy, diagnostic exam, and debridements as described, all compartments were checked and no residual debris remained. Hemostasis was achieved with the cautery wand. The portals were approximated with buried monocryl. All excess fluid was expressed from the joint.  Xeroform sterile gauze dressings were applied followed by Ace bandage and ice pack.   DISPOSITION: The patient was awakened from general anesthetic, extubated, taken to the recovery room in medically stable condition, no apparent complications. The patient may be weightbearing as tolerated to the operative lower extremity.  Range of motion  of right knee as tolerated.

## 2022-08-08 NOTE — Brief Op Note (Signed)
08/08/2022  9:56 AM  PATIENT:  Dylan [name]  57 y.o. male  PRE-OPERATIVE DIAGNOSIS:  Right knee medial meniscus tear  POST-OPERATIVE DIAGNOSIS:  Right knee medial meniscus tear  PROCEDURE:  Procedure(s) with comments: KNEE ARTHROSCOPY WITH PARTIAL MEDIAL MENISECTOMY (Right) - 60  SURGEON:  Surgeon(s) and Role:    Stann Mainland, Elly Modena, MD - Primary  PHYSICIAN ASSISTANT: Jonelle Sidle, PA-C  ANESTHESIA:   local and general  EBL:  5 cc  BLOOD ADMINISTERED:none  DRAINS: none   LOCAL MEDICATIONS USED:  MARCAINE     SPECIMEN:  No Specimen  DISPOSITION OF SPECIMEN:  N/A  COUNTS:  YES  TOURNIQUET:  * No tourniquets in log *  DICTATION: .Note written in EPIC  PLAN OF CARE: Discharge to home after PACU  PATIENT DISPOSITION:  PACU - hemodynamically stable.   Delay start of Pharmacological VTE agent (>24hrs) due to surgical blood loss or risk of bleeding: not applicable

## 2022-08-09 NOTE — Anesthesia Postprocedure Evaluation (Signed)
Anesthesia Post Note  Patient: Dylan [name]  Procedure(s) Performed: KNEE ARTHROSCOPY WITH PARTIAL MEDIAL MENISECTOMY (Right: Knee)     Patient location during evaluation: PACU Anesthesia Type: General Level of consciousness: sedated and patient cooperative Pain management: pain level controlled Vital Signs Assessment: post-procedure vital signs reviewed and stable Respiratory status: spontaneous breathing Cardiovascular status: stable Anesthetic complications: no   No notable events documented.  Last Vitals:  Vitals:   08/08/22 1000 08/08/22 1015  BP: 118/77 131/76  Pulse: 66 76  Resp: 15 16  Temp:  36.9 C  SpO2: 95% 97%    Last Pain:  Vitals:   08/08/22 1015  TempSrc:   PainSc: 0-No pain                 Nolon Nations

## 2022-08-11 ENCOUNTER — Encounter (HOSPITAL_BASED_OUTPATIENT_CLINIC_OR_DEPARTMENT_OTHER): Payer: Self-pay | Admitting: Orthopedic Surgery

## 2022-08-11 NOTE — Transfer of Care (Signed)
Immediate Anesthesia Transfer of Care Note  Patient: Dylan [name]  Procedure(s) Performed: KNEE ARTHROSCOPY WITH PARTIAL MEDIAL MENISECTOMY (Right: Knee)  Patient Location: PACU  Anesthesia Type:General  Level of Consciousness: sedated  Airway & Oxygen Therapy: Patient Spontanous Breathing and Patient connected to face mask oxygen  Post-op Assessment: Report given to RN and Post -op Vital signs reviewed and stable  Post vital signs: Reviewed and stable  Last Vitals:  Vitals Value Taken Time  BP 131/76 08/08/22 1015  Temp 36.9 C 08/08/22 1015  Pulse 76 08/08/22 1015  Resp 16 08/08/22 1015  SpO2 97 % 08/08/22 1015    Last Pain:  Vitals:   08/08/22 1015  TempSrc:   PainSc: 0-No pain      Patients Stated Pain Goal: 3 (98/10/25 4862)  Complications: No notable events documented.

## 2022-08-13 NOTE — Progress Notes (Signed)
Fax received for medical clearance/medication hold for Dr. Cain.  Provider signed, form faxed back to sender, verified successful, sent to scan center.  

## 2023-04-09 ENCOUNTER — Other Ambulatory Visit: Payer: Self-pay

## 2023-04-09 ENCOUNTER — Emergency Department (HOSPITAL_COMMUNITY): Payer: BC Managed Care – PPO

## 2023-04-09 ENCOUNTER — Observation Stay (HOSPITAL_COMMUNITY)
Admission: EM | Admit: 2023-04-09 | Discharge: 2023-04-09 | Disposition: A | Discharge status: Home / Self Care | Payer: BC Managed Care – PPO | Attending: Emergency Medicine | Admitting: Emergency Medicine

## 2023-04-09 ENCOUNTER — Encounter (HOSPITAL_COMMUNITY): Payer: Self-pay

## 2023-04-09 ENCOUNTER — Encounter (HOSPITAL_COMMUNITY): Payer: Self-pay | Admitting: General Surgery

## 2023-04-09 DIAGNOSIS — Z7984 Long term (current) use of oral hypoglycemic drugs: Secondary | ICD-10-CM | POA: Diagnosis not present

## 2023-04-09 DIAGNOSIS — Z87891 Personal history of nicotine dependence: Secondary | ICD-10-CM | POA: Diagnosis not present

## 2023-04-09 DIAGNOSIS — Z8673 Personal history of transient ischemic attack (TIA), and cerebral infarction without residual deficits: Secondary | ICD-10-CM | POA: Insufficient documentation

## 2023-04-09 DIAGNOSIS — Z7901 Long term (current) use of anticoagulants: Secondary | ICD-10-CM | POA: Insufficient documentation

## 2023-04-09 DIAGNOSIS — K610 Anal abscess: Secondary | ICD-10-CM | POA: Diagnosis present

## 2023-04-09 DIAGNOSIS — Z8546 Personal history of malignant neoplasm of prostate: Secondary | ICD-10-CM | POA: Diagnosis not present

## 2023-04-09 DIAGNOSIS — K611 Rectal abscess: Secondary | ICD-10-CM | POA: Diagnosis present

## 2023-04-09 DIAGNOSIS — Z79899 Other long term (current) drug therapy: Secondary | ICD-10-CM | POA: Diagnosis not present

## 2023-04-09 DIAGNOSIS — I10 Essential (primary) hypertension: Secondary | ICD-10-CM | POA: Diagnosis not present

## 2023-04-09 DIAGNOSIS — E119 Type 2 diabetes mellitus without complications: Secondary | ICD-10-CM | POA: Diagnosis not present

## 2023-04-09 DIAGNOSIS — I2510 Atherosclerotic heart disease of native coronary artery without angina pectoris: Secondary | ICD-10-CM | POA: Insufficient documentation

## 2023-04-09 DIAGNOSIS — Z86718 Personal history of other venous thrombosis and embolism: Secondary | ICD-10-CM | POA: Diagnosis not present

## 2023-04-09 DIAGNOSIS — Z955 Presence of coronary angioplasty implant and graft: Secondary | ICD-10-CM | POA: Insufficient documentation

## 2023-04-09 DIAGNOSIS — Z96652 Presence of left artificial knee joint: Secondary | ICD-10-CM | POA: Insufficient documentation

## 2023-04-09 LAB — I-STAT CHEM 8, ED
BUN: 17 mg/dL (ref 6–20)
Calcium, Ion: 1.08 mmol/L — ABNORMAL LOW (ref 1.15–1.40)
Chloride: 93 mmol/L — ABNORMAL LOW (ref 98–111)
Creatinine, Ser: 1 mg/dL (ref 0.61–1.24)
Glucose, Bld: 121 mg/dL — ABNORMAL HIGH (ref 70–99)
HCT: 41 % (ref 39.0–52.0)
Hemoglobin: 13.9 g/dL (ref 13.0–17.0)
Potassium: 3.3 mmol/L — ABNORMAL LOW (ref 3.5–5.1)
Sodium: 138 mmol/L (ref 135–145)
TCO2: 29 mmol/L (ref 22–32)

## 2023-04-09 LAB — CBC WITH DIFFERENTIAL/PLATELET
Abs Immature Granulocytes: 0.05 10*3/uL (ref 0.00–0.07)
Basophils Absolute: 0 10*3/uL (ref 0.0–0.1)
Basophils Relative: 1 %
Eosinophils Absolute: 0 10*3/uL (ref 0.0–0.5)
Eosinophils Relative: 0 %
HCT: 40.9 % (ref 39.0–52.0)
Hemoglobin: 13.8 g/dL (ref 13.0–17.0)
Immature Granulocytes: 1 %
Lymphocytes Relative: 9 %
Lymphs Abs: 0.6 10*3/uL — ABNORMAL LOW (ref 0.7–4.0)
MCH: 31.2 pg (ref 26.0–34.0)
MCHC: 33.7 g/dL (ref 30.0–36.0)
MCV: 92.3 fL (ref 80.0–100.0)
Monocytes Absolute: 0.5 10*3/uL (ref 0.1–1.0)
Monocytes Relative: 8 %
Neutro Abs: 5.3 10*3/uL (ref 1.7–7.7)
Neutrophils Relative %: 81 %
Platelets: 289 10*3/uL (ref 150–400)
RBC: 4.43 MIL/uL (ref 4.22–5.81)
RDW: 14 % (ref 11.5–15.5)
WBC: 6.5 10*3/uL (ref 4.0–10.5)
nRBC: 0 % (ref 0.0–0.2)

## 2023-04-09 MED ORDER — DIPHENHYDRAMINE HCL 25 MG PO CAPS: 25.0000 mg | ORAL_CAPSULE | Freq: Four times a day (QID) | ORAL | Status: DC | PRN

## 2023-04-09 MED ORDER — MORPHINE SULFATE (PF) 2 MG/ML IV SOLN: 2.0000 mg | INTRAVENOUS | Status: DC | PRN

## 2023-04-09 MED ORDER — ONDANSETRON HCL 4 MG/2ML IJ SOLN: 4.0000 mg | Freq: Four times a day (QID) | INTRAMUSCULAR | Status: DC | PRN

## 2023-04-09 MED ORDER — HYDRALAZINE HCL 20 MG/ML IJ SOLN: 10.0000 mg | INTRAMUSCULAR | Status: DC | PRN

## 2023-04-09 MED ORDER — MORPHINE SULFATE (PF) 4 MG/ML IV SOLN
4.0000 mg | Freq: Once | INTRAVENOUS | Status: AC
  Administered 2023-04-09: 4 mg via INTRAVENOUS
  Filled 2023-04-09: qty 1

## 2023-04-09 MED ORDER — DIPHENHYDRAMINE HCL 50 MG/ML IJ SOLN: 25.0000 mg | Freq: Four times a day (QID) | INTRAMUSCULAR | Status: DC | PRN

## 2023-04-09 MED ORDER — ACETAMINOPHEN 500 MG PO TABS: 1000.0000 mg | ORAL_TABLET | Freq: Four times a day (QID) | ORAL | Status: DC

## 2023-04-09 MED ORDER — MELATONIN 3 MG PO TABS: 3.0000 mg | ORAL_TABLET | Freq: Every evening | ORAL | Status: DC | PRN

## 2023-04-09 MED ORDER — POLYETHYLENE GLYCOL 3350 17 G PO PACK: 17.0000 g | PACK | Freq: Every day | ORAL | Status: DC | PRN

## 2023-04-09 MED ORDER — SODIUM CHLORIDE 0.9 % IV SOLN: 2.0000 g | INTRAVENOUS | Status: DC

## 2023-04-09 MED ORDER — SODIUM CHLORIDE 0.9 % IV SOLN: INTRAVENOUS | Status: DC

## 2023-04-09 MED ORDER — OXYCODONE HCL 5 MG PO TABS: 5.0000 mg | ORAL_TABLET | ORAL | Status: DC | PRN

## 2023-04-09 MED ORDER — ONDANSETRON 4 MG PO TBDP: 4.0000 mg | ORAL_TABLET | Freq: Four times a day (QID) | ORAL | Status: DC | PRN

## 2023-04-09 MED ORDER — METRONIDAZOLE 500 MG/100ML IV SOLN: 500.0000 mg | Freq: Two times a day (BID) | INTRAVENOUS | Status: DC

## 2023-04-09 MED ORDER — SIMETHICONE 80 MG PO CHEW: 40.0000 mg | CHEWABLE_TABLET | Freq: Four times a day (QID) | ORAL | Status: DC | PRN

## 2023-04-09 MED ORDER — IOHEXOL 300 MG/ML  SOLN
100.0000 mL | Freq: Once | INTRAMUSCULAR | Status: AC | PRN
  Administered 2023-04-09: 100 mL via INTRAVENOUS

## 2023-04-09 NOTE — ED Notes (Signed)
Patient requested discontinuation of care. Patient reports he will return to [facility] tomorrow.

## 2023-04-09 NOTE — Progress Notes (Signed)
PCP - Dr. Loleta Books  Cardiologist - Dr. Rubie Maid  EP- Denies  Endocrine- Denies  Pulm- Denies  Chest x-ray - Denies  EKG - 07/21/22 (E)  Stress Test - Denies  ECHO - 07/12/21 (E)  Cardiac Cath - 04/29/21 (E)  AICD-na PM-na LOOP-na  Nerve Stimulator- Denies  Dialysis- Denies  Sleep Study - Yes- Negative CPAP - Denies  LABS- 04/09/23(E): CBC w/D, I-Stat 8  ASA- Denies  ERAS- Yes- clears until 0520  HA1C- 03/11/23(E): 6.3 GLP-1- Mounjaro- Wednesdays- LD- 04/08/23 Fasting Blood Sugar - 85-140 Checks Blood Sugar ___1__ every 2-3 weeks  Anesthesia- No  Pt denies having chest pain, sob, or fever at this time. All instructions explained to the pt, with a verbal understanding of the material. Pt agrees to go over the instructions while at home for a better understanding. The opportunity to ask questions was provided.

## 2023-04-09 NOTE — H&P (Signed)
[facility] Admission Note  JASDEEP MCKOWN 05/07/1966  [id number].    Requesting MD: Derwood Kaplan Chief Complaint/Reason for Consult: perirectal abscess  HPI:  RONI STARR is a 57 y.o. male h/o DVT from COVID, also May Thurner's and now indwelling IVC + migrated iliac vein stent, on Xarelto (Last dose 9/4 at 1900) who was sent to the ED from our office with concern for perirectal abscess. He has never had one before. He was seen by Dr. Cliffton Asters today as a referral for hemorrhoid. On exam he had no hemorrhoids, but was very tender in the left posterior position concerning for possible deeper abscess. He was sent to the ED for CT scan. This shows a 3.9 cm left posterior perianal abscess.   Family History  Problem Relation Age of Onset   Atrial fibrillation Mother    Mitral valve prolapse Mother    Coronary artery disease Father        CABG, aortic aneursym,   Heart attack Father    Heart attack Paternal Uncle    Heart attack Paternal Grandfather    Clotting disorder Neg Hx     Past Medical History:  Diagnosis Date   Arthritis    CAD (coronary artery disease) 07/2010; 07/2012; 08/2014   cardiologist--- dr croitoru;  1) 11-28-'11: NSTEMI - 100% RCA - PCI (3 Taxus ION DES 3.0 x 28, 3.0 x 24 & 3.5 x 12 distal-prox); b) 12/ 2013 ISR of RCA stent - -AnngioSculpt PTCA; c) NSTEMI 1/'16: mild ISR with Thrombosis of RCA Stents (in setting of Mesenteric Vein Thrombosis) - Cutting Balloon PTCA; c. Re-look Cath 07/2015 & 07/2016: ~15% ISR in RCA,20% p-mLAD & o-pCx.   Chronic gout    01-07-2021  last flare-up approxl 12-17-2020 bilteral great toes   DM type 2 (diabetes mellitus, type 2) (HCC)    followed by pcp   (01-07-2021 per pt does not check blood sugar at home)   Edema of both lower extremities    01-07-2021  pt states wears compression hose   Essential hypertension    followed by pcp and cardiology   History of concussion    01-07-2021  per pt x4 as child, last one  age 1 without residual   History of CVA with residual deficit 07/01/2010   embolic left MCA infarct post PCI stenting approx one hour  (01-07-2021 per pt has residual minimal intermittant aphasia )   History of non-ST elevation myocardial infarction (NSTEMI) 07-01-2010  and 01/ 2016   History of prostatitis    History of resection of small bowel 08/28/2014   small bowel perforation,  s/p resction    History of retinal tear 04/2013   right micro-rupture , no occlusion, retinal artery branch w/ vision loss,  per imaging no infarct/ occlusion   History of thrombosis 08/2014   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, provoked by testosterone injection,  08-09-2014 s/p thrombolytic lysis VIR    Hyperlipidemia with target LDL less than 70 09/01/2014   May-Thurner syndrome    Myocardial infarction (HCC) 2011   and 2016   Nocturia    PAC (premature atrial contraction)    pt hx palpitations, event monitor 08-18-2017  showed NS w/ occasional PACs, rare PVCs   Peripheral vascular disease (HCC)    Prostate cancer [facility]) urologist-- dr herrick/  oncologist-- dr Kathrynn Running   dx 09-04-2020,  Stage T1c, Gleason 4+3    S/P drug eluting coronary stent placement 07/01/2010   DES  x3  to prox and distal RCA   S/P percutaneous transluminal coronary angioplasty 12/ 2013 and 01/ 2016   for ISR  RCA   Stroke [facility]) 2011   some residual aphasia    Past Surgical History:  Procedure Laterality Date   BOWEL RESECTION N/A 08/28/2014   Procedure: SMALL BOWEL RESECTION;  Surgeon: Abigail Miyamoto, MD;  Location: [facility] OR;  Service: General;  Laterality: N/A;   CARDIAC CATHETERIZATION N/A 07/20/2015   Procedure: Left Heart Cath and Coronary Angiography;  Surgeon: Peter M Swaziland, MD;  Location: [facility] INVASIVE CV LAB;  Service: Cardiovascular;  15% ISR in the RCA stent segment, 20% proximal-mid LAD as well as ostial and proximal circumflex. Normal LV function.   CARDIAC CATHETERIZATION N/A 07/23/2016    Procedure: Left Heart Cath and Coronary Angiography;  Surgeon: Peter M Swaziland, MD;  Location: [facility] INVASIVE CV LAB;  Service: Cardiovascular: E 55-65%. ~15% diffuse RCA ISR, ~20% diffuse pLAD & pCx   CORONARY ANGIOPLASTY  07/06/2012; 08/15/2014   a) 12/'13: PTCA of 80% ISR mRCA - AngioSculpt; b) PTCA of  ISR/thrombosis   CORONARY ANGIOPLASTY WITH STENT PLACEMENT  07-01-2010  dr harding @[facility]    inferior wall MI - 3 Taxus Ion DES (3.0x57mm, 3.0x76mm, 3.5x60mm) to prox and mid RCA   CORONARY ULTRASOUND/IVUS Left 04/29/2021   Procedure: Intravascular Ultrasound/IVUS;  Surgeon: Maeola Harman, MD;  Location: [facility] INVASIVE CV LAB;  Service: Cardiovascular;  Laterality: Left;   CYSTOSCOPY N/A 01/10/2021   Procedure: CYSTOSCOPY FLEXIBLE;  Surgeon: Crist Fat, MD;  Location: [facility];  Service: Urology;  Laterality: N/A;   INCISIONAL HERNIA REPAIR N/A 10/15/2017   Procedure: HERNIA REPAIR INCISIONAL;  Surgeon: Abigail Miyamoto, MD;  Location: [facility] ORS;  Service: General;  Laterality: N/A;   INGUINAL HERNIA REPAIR Bilateral 05/2010   INSERTION OF MESH N/A 10/15/2017   Procedure: INSERTION OF MESH;  Surgeon: Abigail Miyamoto, MD;  Location: [facility] ORS;  Service: General;  Laterality: N/A;   IVC FILTER INSERTION N/A 08/19/2021   Procedure: IVC FILTER INSERTION;  Surgeon: Maeola Harman, MD;  Location[facility] INVAS[facility] CV LAB;  Service: Cardiovascular;  Laterality: N/A;   KNEE ARTHROPLASTY Left 10/09/2021   Procedure: COMPUTER ASSISTED TOTAL KNEE ARTHROPLASTY;  Surgeon: Samson Frederic, MD;  Location: [facility] ORS;  Service: Orthopedics;  Laterality: Left;  150   KNEE ARTHROSCOPY W/ MENISCAL REPAIR Left 06/2011   KNEE ARTHROSCOPY WITH MEDIAL MENISECTOMY Right 08/08/2022   Procedure: KNEE ARTHROSCOPY WITH PARTIAL MEDIAL MENISECTOMY;  Surgeon: Yolonda Kida, MD;  Location: [facility];  Service: Orthopedics;  Laterality: Right;  60   LAPAROSCOPIC APPENDECTOMY N/A 08/28/2014    Procedure: APPENDECTOMY LAPAROSCOPIC;  Surgeon: Abigail Miyamoto, MD;  Location: [facility] OR;  Service: General;  Laterality: N/A;   LAPAROSCOPY  08/28/2014   Procedure: LAPAROSCOPY DIAGNOSTIC;  Surgeon: Abigail Miyamoto, MD;  Location: [facility] OR;  Service: General;;   LEFT HEART CATHETERIZATION WITH CORONARY ANGIOGRAM N/A 06/11/2012   Procedure: LEFT HEART CATHETERIZATION WITH CORONARY ANGIOGRAM;  Surgeon: Thurmon Fair, MD;  Location: [facility] CATH LAB;  Service: Cardiovascular;  Laterality: N/A;   LEFT HEART CATHETERIZATION WITH CORONARY ANGIOGRAM N/A 04/18/2013   Procedure: LEFT HEART CATHETERIZATION WITH CORONARY ANGIOGRAM;  Surgeon: Lennette Bihari, MD;  Location: [facility] CATH LAB;  Service: Cardiovascular;  Laterality: N/A;   LEFT HEART CATHETERIZATION WITH CORONARY ANGIOGRAM N/A 08/15/2014   Procedure: LEFT HEART CATHETERIZATION WITH CORONARY ANGIOGRAM;  Surgeon: Marykay Lex, MD;  Location: [facility] CATH LAB;  Service:  Cardiovascular;  Laterality: N/A;   LOWER EXTREMITY VENOGRAPHY Left 04/29/2021   Procedure: LOWER EXTREMITY VENOGRAPHY;  Surgeon: Maeola Harman, MD;  Location: [facility] INVASIVE CV LAB;  Service: Cardiovascular;  Laterality: Left;   LOWER EXTREMITY VENOGRAPHY Left 08/19/2021   Procedure: LOWER EXTREMITY VENOGRAPHY;  Surgeon: Maeola Harman, MD;  Location: [facility] INVASIVE CV LAB;  Service: Cardiovascular;  Laterality: Left;   PERCUTANEOUS CORONARY STENT INTERVENTION (PCI-S) N/A 07/06/2012   Procedure: PERCUTANEOUS CORONARY STENT INTERVENTION (PCI-S);  Surgeon: Runell Gess, MD;  Location: [facility] CATH LAB;  Service: Cardiovascular;  Laterality: N/A;   PERIPHERAL VASCULAR INTERVENTION Left 08/19/2021   Procedure: PERIPHERAL VASCULAR INTERVENTION;  Surgeon: Maeola Harman, MD;  Location: [facility] INVASIVE CV LAB;  Service: Cardiovascular;  Laterality: Left;  Common Iliac Venous   PERIPHERAL VASCULAR THROMBECTOMY Left 04/29/2021   Procedure: PERIPHERAL VASCULAR THROMBECTOMY;  Surgeon: Maeola Harman, MD;  Location: [facility] INVASIVE CV LAB;  Service: Cardiovascular;  Laterality: Left;   RADIOACTIVE SEED IMPLANT N/A 01/10/2021   Procedure: RADIOACTIVE SEED IMPLANT/BRACHYTHERAPY IMPLANT;  Surgeon: Crist Fat, MD;  Location: [facility];  Service: Urology;  Laterality: N/A;   SEPTOPLASTY  11-01-2001 @ [facility]   SPACE OAR INSTILLATION N/A 01/10/2021   Procedure: SPACE OAR INSTILLATION;  Surgeon: Crist Fat, MD;  Location: [facility];  Service: Urology;  Laterality: N/A;   TONSILLECTOMY AND ADENOIDECTOMY  age 78   TRANSTHORACIC ECHOCARDIOGRAM  08/15/2014   Moderate concentric LVH. EF 60-65% with no regional WMA. Gr 1 DD, otherwise normal    Social History:  reports that he quit smoking about 12 years ago. His smoking use included cigarettes. He started smoking about 27 years ago. He has a 15 pack-year smoking history. His smokeless tobacco use includes snuff. He reports current alcohol use of about 7.0 - 14.0 standard drinks of alcohol per week. He reports that he does not use drugs.  Allergies:  Allergies  Allergen Reactions   Crestor [Rosuvastatin] Nausea Only and Other (See Comments)    Severe muscle pain and nausea   Zofran [Ondansetron Hcl] Nausea And Vomiting   Ace Inhibitors Cough   Lipitor [Atorvastatin] Other (See Comments)    myalgia   Codeine Itching    (Not in a hospital admission)   Prior to Admission medications   Medication Sig Start Date End Date Taking? Authorizing Provider  acetaminophen (TYLENOL) 325 MG tablet Take 650 mg by mouth daily as needed (pain).    [provider]  carvedilol (COREG) 12.5 MG tablet TAKE 1 TABLET(12.5 MG) BY MOUTH TWICE DAILY 07/16/22   Croitoru, Mihai, MD  Empagliflozin-metFORMIN HCl ER (SYNJARDY XR) 25-1000 MG TB24 Take 1 tablet by mouth every morning.    [provider]  Evolocumab (REPATHA SURECLICK) 140 MG/ML SOAJ ADMINISTER 1 ML UNDER THE SKIN EVERY 14 DAYS  07/24/22   Croitoru, Mihai, MD  furosemide (LASIX) 40 MG tablet TAKE 1 TABLET BY MOUTH EVERY MORNING AND 2 TABLETS EVERY EVENING 07/16/22   Croitoru, Mihai, MD  HYDROcodone-acetaminophen (NORCO) 5-325 MG tablet Take 1 tablet by mouth every 6 (six) hours as needed for moderate pain or severe pain. 08/08/22   Yolonda Kida, MD  nitroGLYCERIN (NITROSTAT) 0.4 MG SL tablet Place 1 tablet (0.4 mg total) under the tongue every 5 (five) minutes x 3 doses as needed for chest pain. 02/12/22   Croitoru, Mihai, MD  olmesartan (BENICAR) 40 MG tablet TAKE 1 TABLET(40 MG) BY MOUTH DAILY 07/16/22   Croitoru,  Mihai, MD  promethazine (PHENERGAN) 12.5 MG tablet Take 1 tablet (12.5 mg total) by mouth every 8 (eight) hours as needed for nausea or vomiting. 08/08/22   Yolonda Kida, MD  rivaroxaban (XARELTO) 20 MG TABS tablet Take 20 mg by mouth daily with supper.    [provider]  rivaroxaban (XARELTO) 20 MG TABS tablet Take 1 tablet (20 mg total) by mouth daily with supper. 03/19/22   Maeola Harman, MD  rivaroxaban (XARELTO) 20 MG TABS tablet Take 1 tablet (20 mg total) by mouth daily with supper. 03/19/22   Maeola Harman, MD  traMADol (ULTRAM) 50 MG tablet Take 50 mg by mouth 3 (three) times daily as needed. 12/25/21   [provider]    Blood pressure (!) 172/93, pulse 81, temperature 98.3 F (36.8 C), temperature source Oral, resp. rate 18, height 6\' 1"  (1.854 m), weight 108.9 kg, SpO2 97%. Physical Exam: General: pleasant, WD/WN male who is laying in bed in NAD HEENT: head is normocephalic, atraumatic.  Sclera are noninjected.  Pupils equal and round.  Ears and nose without any masses or lesions.  Mouth is pink and moist. Dentition fair Heart: regular, rate, and rhythm Lungs: CTAB, no wheezes, rhonchi, or rales noted.  Respiratory effort nonlabored on room air Abd: soft, NT/ND GU: exam deferred as he was just seen by Dr. Cliffton Asters  No results found for this or  any previous visit (from the past 48 hour(s)). No results found.    Assessment/Plan Perirectal abscess - CT scan today shows 3.9 cm left posterior perianal abscess - Plan to admit patient for observation and take to the OR tomorrow for I&D. We have more OR availability at [facility] and will therefore admit him there. Ok for diet tonight, NPO after midnight. Hold xarelto (last dose 9/4 at 1900).   ID - zosyn VTE - SCDs, hold xarelto FEN - IVF, regular diet, NPO after midnight Foley - none  H/o DVT from COVID May Thurner's and now indwelling IVC + migrated iliac vein stent, on Xarelto   I reviewed ED provider notes, last 24 h vitals and pain scores, last 48 h intake and output, last 24 h labs and trends, and last 24 h imaging results.   Franne Forts, PA-C [facility] 04/09/2023, 2:33 PM Please see Amion for pager number during day hours 7:00am-4:30pm

## 2023-04-09 NOTE — Discharge Instructions (Addendum)
You were seen in the emergency room for rectal pain and perianal abscess.  Surgery team has recommended admission.  They informed us that due to certain circumstances, you prefer being discharged and going to [facility] emergency room tomorrow.  Please do not eat or drink anything after midnight.  Do not take your blood thinner anymore.  Return to the ER tomorrow at [facility] tomorrow for definitive care.

## 2023-04-09 NOTE — ED Triage Notes (Signed)
Patient was told he has an anal abscess. Has had rectal pain for 3 days. Is taking a blood thinner and was told patient needs antibiotics and monitoring for after the abscess is cut.

## 2023-04-09 NOTE — ED Provider Notes (Signed)
[place] EMERGENCY DEPARTMENT AT [facility] Provider Note   CSN: [id number] Arrival date & time: 04/09/23  1250     History  Chief Complaint  Patient presents with   Abscess    Dylan [name] is a 57 y.o. male.  HPI    57 year old male comes in with chief complaint of abscess.  Patient was having rectal pain.  He was seen by general surgery team, they have done a digital rectal exam and there was concerns for perineal abscess.  Patient has advised to come to the emergency room for admission.  Patient has history of A-fib and is on anticoagulation.  He also has diabetes.  Patient denies any fevers, chills.  Home Medications Prior to Admission medications   Medication Sig Start Date End Date Taking? Authorizing Provider  acetaminophen (TYLENOL) 325 MG tablet Take 650 mg by mouth daily as needed (pain).   Yes [provider]  carvedilol (COREG) 12.5 MG tablet TAKE 1 TABLET(12.5 MG) BY MOUTH TWICE DAILY 07/16/22  Yes Croitoru, Mihai, MD  Empagliflozin-metFORMIN HCl ER (SYNJARDY XR) 25-1000 MG TB24 Take 1 tablet by mouth every morning.   Yes [provider]  Evolocumab (REPATHA SURECLICK) 140 MG/ML SOAJ ADMINISTER 1 ML UNDER THE SKIN EVERY 14 DAYS 07/24/22  Yes Croitoru, Mihai, MD  furosemide (LASIX) 40 MG tablet TAKE 1 TABLET BY MOUTH EVERY MORNING AND 2 TABLETS EVERY EVENING 07/16/22  Yes Croitoru, Mihai, MD  MOUNJARO 5 MG/0.5ML Pen Inject 5 mg into the skin once a week. 04/03/23  Yes [provider]  olmesartan (BENICAR) 40 MG tablet TAKE 1 TABLET(40 MG) BY MOUTH DAILY 07/16/22  Yes Croitoru, Mihai, MD  tadalafil (CIALIS) 10 MG tablet Take 10 mg by mouth daily as needed for erectile dysfunction.   Yes [provider]  traMADol (ULTRAM) 50 MG tablet Take 50 mg by mouth 3 (three) times daily as needed. 12/25/21  Yes [provider]      Allergies    Crestor [rosuvastatin], Zofran [ondansetron hcl], Ace inhibitors, Lipitor  [atorvastatin], and Codeine    Review of Systems   Review of Systems  All other systems reviewed and are negative.   Physical Exam Updated Vital Signs BP (!) 154/89   Pulse 73   Temp 98 F (36.7 C) (Oral)   Resp 16   Ht 6\' 1"  (1.854 m)   Wt 108.9 kg   SpO2 95%   BMI 31.66 kg/m  Physical Exam Vitals and nursing note reviewed.  Constitutional:      Appearance: He is well-developed.  HENT:     Head: Atraumatic.  Eyes:     Extraocular Movements: Extraocular movements intact.     Pupils: Pupils are equal, round, and reactive to light.  Cardiovascular:     Rate and Rhythm: Normal rate.  Pulmonary:     Effort: Pulmonary effort is normal.  Musculoskeletal:     Cervical back: Neck supple.  Skin:    General: Skin is warm.  Neurological:     Mental Status: He is alert and oriented to person, place, and time.     ED Results / Procedures / Treatments   Labs (all labs ordered are listed, but only abnormal results are displayed) Labs Reviewed  CBC WITH DIFFERENTIAL/PLATELET - Abnormal; Notable for the following components:      Result Value   Lymphs Abs 0.6 (*)    All other components within normal limits  I-STAT CHEM 8, ED - Abnormal; Notable for the  following components:   Potassium 3.3 (*)    Chloride 93 (*)    Glucose, Bld 121 (*)    Calcium, Ion 1.08 (*)    All other components within normal limits  HIV ANTIBODY (ROUTINE TESTING W REFLEX)  BASIC METABOLIC PANEL  CBC    EKG None  Radiology CT PELVIS W CONTRAST  Result Date: 04/09/2023 CLINICAL DATA:  History of anal abscess rectal pain for 3 days EXAM: CT PELVIS WITH CONTRAST TECHNIQUE: Multidetector CT imaging of the pelvis was performed using the standard protocol following the bolus administration of intravenous contrast. RADIATION DOSE REDUCTION: This exam was performed according to the departmental dose-optimization program which includes automated exposure control, adjustment of the mA and/or kV according  to patient size and/or use of iterative reconstruction technique. CONTRAST:  OMNIPAQUE IOHEXOL 300 MG/ML  SOLN COMPARISON:  CT 03/13/2022 FINDINGS: Urinary Tract:  Bladder is unremarkable. Bowel: Mild rectal wall thickening with mild hazy edema in the perirectal fat. Rim enhancing left posterior perianal fluid collection measuring 3.3 cm transverse high by 3.9 cm AP by 3.7 cm craniocaudad. Vascular/Lymphatic: Partially visualized stent within the distal IVC. Moderate aortic atherosclerosis without aneurysm. No suspicious lymph nodes. Reproductive:  Multiple prostate seeds. Other:  Negative for pelvic effusion or free air. Musculoskeletal: No acute osseous abnormality IMPRESSION: 1. 3.9 cm left posterior perianal abscess. 2. Mild rectal wall thickening with mild hazy edema in the perirectal fat, suggesting mild proctitis. 3. Aortic atherosclerosis. Aortic Atherosclerosis (ICD10-I70.0). Electronically Signed   By: Jasmine Pang M.D.   On: 04/09/2023 16:25    Procedures Procedures    Medications Ordered in ED Medications  0.9 %  sodium chloride infusion (has no administration in time range)  cefTRIAXone (ROCEPHIN) 2 g in sodium chloride 0.9 % 100 mL IVPB (has no administration in time range)    And  metroNIDAZOLE (FLAGYL) IVPB 500 mg (has no administration in time range)  acetaminophen (TYLENOL) tablet 1,000 mg (has no administration in time range)  oxyCODONE (Oxy IR/ROXICODONE) immediate release tablet 5-10 mg (has no administration in time range)  morphine (PF) 2 MG/ML injection 2 mg (has no administration in time range)  melatonin tablet 3 mg (has no administration in time range)  diphenhydrAMINE (BENADRYL) capsule 25 mg (has no administration in time range)    Or  diphenhydrAMINE (BENADRYL) injection 25 mg (has no administration in time range)  polyethylene glycol (MIRALAX / GLYCOLAX) packet 17 g (has no administration in time range)  ondansetron (ZOFRAN-ODT) disintegrating tablet 4 mg  (has no administration in time range)    Or  ondansetron (ZOFRAN) injection 4 mg (has no administration in time range)  simethicone (MYLICON) chewable tablet 40 mg (has no administration in time range)  hydrALAZINE (APRESOLINE) injection 10 mg (has no administration in time range)  morphine (PF) 4 MG/ML injection 4 mg (4 mg Intravenous Given 04/09/23 1428)  iohexol (OMNIPAQUE) 300 MG/ML solution 100 mL (100 mLs Intravenous Contrast Given 04/09/23 1502)    ED Course/ Medical Decision Making/ A&P Clinical Course as of 04/10/23 1603  Thu Apr 09, 2023  1726 General Surgery team informed me that they placed admission orders to [facility] and informed patient that he will need surgical care tomorrow.  Patient was not happy about the plan.  He indicated that he rather be discharged and he will go to [facility] ER tomorrow.  They do not think he needs any antibiotics, that he can just come to the ER tomorrow.  Will discharge AMA. [  AN]    Clinical Course User Index [AN] Derwood Kaplan, MD                                 Medical Decision Making Amount and/or Complexity of Data Reviewed Labs: ordered. Radiology: ordered.  Risk Prescription drug management. Decision regarding hospitalization.  57 year old male with history of thrombosis on anticoagulation, diabetes, CAD comes in with chief complaint of rectal pain.  Differential diagnosis for him included thrombosed hemorrhoid, perianal abscess, rectal mass, prostatitis.  Initial plan is to get basic labs, CT pelvis with contrast and focus on pain control.   I have independently interpreted patient's CT scan.  There appears to be a perianal abscess. I discussed the case with general surgery, they will come and see the patient. Final Clinical Impression(s) / ED Diagnoses Final diagnoses:  Perianal abscess    Rx / DC Orders ED Discharge Orders          Ordered    Discharge patient        04/09/23 1706              Derwood Kaplan,  MD 04/10/23 1606

## 2023-04-09 NOTE — Progress Notes (Signed)
S.D.W- Instructions   Your procedure is scheduled on Friday, Sept. 6, 2024 from 8:52AM-9:43AM.  Report to [facility] Main Entrance "A" at 6:20 A.M., then check in with the Admitting office.  Call this number if you have problems the morning of surgery:  [phone]             If you experience any cold or flu symptoms such as cough, fever, chills, shortness of breath, etc. between now and your scheduled surgery, please notify us at the above         number.   Remember:  Do not eat after midnight the night before your surgery  You may drink clear liquids until 3 hours (5:20AM) prior to surgery time, the morning of your surgery.   Clear liquids allowed are: Water, Non-Citrus Juices (without pulp), Carbonated Beverages, Clear Tea, Black Coffee ONLY (NO MILK, CREAM OR POWDERED CREAMER of any kind), and Gatorade    Take these medicines the morning of surgery with A SIP OF WATER:  carvedilol (COREG)   If Needed: acetaminophen (TYLENOL)  traMADol (ULTRAM)   As of today, STOP taking any Aspirin (unless otherwise instructed by your surgeon) Aleve, Naproxen, Ibuprofen, Motrin, Advil, Goody's, BC's, all herbal medications, fish oil, and all vitamins.   How to Manage Your Diabetes Before and After Surgery  How do I manage my blood sugar before surgery? Check your blood sugar the morning of your surgery when you wake up and every 2 hours until you get to the Short Stay unit. If your blood sugar is less than 70 mg/dL, you will need to treat for low blood sugar: Do not take insulin. Treat a low blood sugar (less than 70 mg/dL) with  cup of clear juice (cranberry or apple), 4 glucose tablets, OR glucose gel. Recheck blood sugar in 15 minutes after treatment (to make sure it is greater than 70 mg/dL). If your blood sugar is not greater than 70 mg/dL on recheck, call [phone]  for further instructions. Report your blood sugar to the short stay nurse when you get to Short Stay.  WHAT DO I DO  ABOUT MY DIABETES MEDICATION?  Do not take Empagliflozin-MetFORMIN HCl ER (SYNJARDY XR) the morning of surgery.  Do not take other diabetes injectables for 7 days prior to surgery day, including MOUNJARO.  If your CBG is greater than 220 mg/dL, inform the staff upon arrival to Pre-Op.  Reviewed and Endorsed by [facility] Patient Education Committee, August 2015          Do not wear jewelry. Do not wear lotions, powders, cologne or deodorant. Do not shave 48 hours prior to surgery.  Men may shave face and neck. Do not bring valuables to the hospital.  [facility] is not responsible for any belongings or valuables.    Do NOT Smoke (Tobacco/Vaping)  24 hours prior to your procedure  If you use a CPAP at night, you may bring your mask for your overnight stay.   Contacts, glasses, hearing aids, dentures or partials may not be worn into surgery, please bring cases for these belongings   For patients admitted to the hospital, discharge time will be determined by your treatment team.   Patients discharged the day of surgery will not be allowed to drive home, and someone needs to stay with them for 24 hours.  Special instructions:    Oral Hygiene is also important to reduce your risk of infection.  Remember - BRUSH YOUR TEETH THE MORNING OF SURGERY  WITH YOUR REGULAR TOOTHPASTE  [facility]- Preparing For Surgery  Before surgery, you can play an important role. Because skin is not sterile, your skin needs to be as free of germs as possible. You can reduce the number of germs on your skin by washing with Antibacterial Soap before surgery.     Please follow these instructions carefully.     Shower the NIGHT BEFORE SURGERY and the MORNING OF SURGERY with Antibacterial Soap.   Pat yourself dry with a CLEAN TOWEL.  Wear CLEAN PAJAMAS to bed the night before surgery  Place CLEAN SHEETS on your bed the night before your surgery  DO NOT SLEEP WITH PETS.  Day of Surgery:  Take a  shower with Antibacterial soap. Wear Clean/Comfortable clothing the morning of surgery Do not apply any deodorants/lotions.   Remember to brush your teeth WITH YOUR REGULAR TOOTHPASTE.   If you test positive for Covid, or been in contact with anyone that has tested positive in the last 10 days, please notify your surgeon.  SURGICAL WAITING ROOM VISITATION Patients having surgery or a procedure may have no more than 2 support people in the waiting area - these visitors may rotate.   Children under the age of 41 must have an adult with them who is not the patient. If the patient needs to stay at the hospital during part of their recovery, the visitor guidelines for inpatient rooms apply. Pre-op nurse will coordinate an appropriate time for 1 support person to accompany patient in pre-op.  This support person may not rotate.   Please refer to the [facility] website for the visitor guidelines for Inpatients (after your surgery is over and you are in a regular room).

## 2023-04-10 ENCOUNTER — Ambulatory Visit (HOSPITAL_COMMUNITY): Payer: BC Managed Care – PPO | Admitting: Anesthesiology

## 2023-04-10 ENCOUNTER — Other Ambulatory Visit: Payer: Self-pay | Admitting: Vascular Surgery

## 2023-04-10 ENCOUNTER — Encounter (HOSPITAL_COMMUNITY)
Admission: RE | Disposition: A | Discharge status: Home / Self Care | Payer: Self-pay | Source: Ambulatory Visit | Attending: General Surgery

## 2023-04-10 ENCOUNTER — Encounter (HOSPITAL_COMMUNITY): Payer: Self-pay | Admitting: General Surgery

## 2023-04-10 ENCOUNTER — Ambulatory Visit (HOSPITAL_COMMUNITY)
Admission: RE | Admit: 2023-04-10 | Discharge: 2023-04-10 | Disposition: A | Discharge status: Home / Self Care | Payer: BC Managed Care – PPO | Source: Ambulatory Visit | Attending: General Surgery | Admitting: General Surgery

## 2023-04-10 DIAGNOSIS — I252 Old myocardial infarction: Secondary | ICD-10-CM | POA: Insufficient documentation

## 2023-04-10 DIAGNOSIS — Z79899 Other long term (current) drug therapy: Secondary | ICD-10-CM | POA: Diagnosis not present

## 2023-04-10 DIAGNOSIS — Z7984 Long term (current) use of oral hypoglycemic drugs: Secondary | ICD-10-CM | POA: Diagnosis not present

## 2023-04-10 DIAGNOSIS — Z7901 Long term (current) use of anticoagulants: Secondary | ICD-10-CM | POA: Insufficient documentation

## 2023-04-10 DIAGNOSIS — I69328 Other speech and language deficits following cerebral infarction: Secondary | ICD-10-CM | POA: Insufficient documentation

## 2023-04-10 DIAGNOSIS — Z8546 Personal history of malignant neoplasm of prostate: Secondary | ICD-10-CM | POA: Diagnosis not present

## 2023-04-10 DIAGNOSIS — Z955 Presence of coronary angioplasty implant and graft: Secondary | ICD-10-CM | POA: Insufficient documentation

## 2023-04-10 DIAGNOSIS — Z86718 Personal history of other venous thrombosis and embolism: Secondary | ICD-10-CM | POA: Insufficient documentation

## 2023-04-10 DIAGNOSIS — Z87891 Personal history of nicotine dependence: Secondary | ICD-10-CM | POA: Diagnosis not present

## 2023-04-10 DIAGNOSIS — I739 Peripheral vascular disease, unspecified: Secondary | ICD-10-CM | POA: Insufficient documentation

## 2023-04-10 DIAGNOSIS — K611 Rectal abscess: Secondary | ICD-10-CM | POA: Diagnosis present

## 2023-04-10 DIAGNOSIS — I2510 Atherosclerotic heart disease of native coronary artery without angina pectoris: Secondary | ICD-10-CM | POA: Insufficient documentation

## 2023-04-10 DIAGNOSIS — I10 Essential (primary) hypertension: Secondary | ICD-10-CM | POA: Diagnosis not present

## 2023-04-10 HISTORY — DX: Rectal abscess: K61.1

## 2023-04-10 HISTORY — PX: INCISION AND DRAINAGE PERIRECTAL ABSCESS: SHX1804

## 2023-04-10 LAB — GLUCOSE, CAPILLARY
Glucose-Capillary: 113 mg/dL — ABNORMAL HIGH (ref 70–99)
Glucose-Capillary: 118 mg/dL — ABNORMAL HIGH (ref 70–99)
Glucose-Capillary: 122 mg/dL — ABNORMAL HIGH (ref 70–99)

## 2023-04-10 SURGERY — INCISION AND DRAINAGE, ABSCESS, PERIRECTAL
Anesthesia: General

## 2023-04-10 MED ORDER — FENTANYL CITRATE (PF) 100 MCG/2ML IJ SOLN
INTRAMUSCULAR | Status: AC
  Filled 2023-04-10: qty 2

## 2023-04-10 MED ORDER — BUPIVACAINE-EPINEPHRINE 0.25% -1:200000 IJ SOLN
INTRAMUSCULAR | Status: DC | PRN
Start: 2023-04-10 — End: 2023-04-10
  Administered 2023-04-10: 17 mL

## 2023-04-10 MED ORDER — OXYCODONE HCL 5 MG/5ML PO SOLN: 5.0000 mg | Freq: Once | ORAL | Status: AC | PRN

## 2023-04-10 MED ORDER — CELECOXIB 200 MG PO CAPS
200.0000 mg | ORAL_CAPSULE | Freq: Once | ORAL | Status: AC
  Administered 2023-04-10: 200 mg via ORAL
  Filled 2023-04-10: qty 1

## 2023-04-10 MED ORDER — MIDAZOLAM HCL 2 MG/2ML IJ SOLN
INTRAMUSCULAR | Status: AC
  Filled 2023-04-10: qty 2

## 2023-04-10 MED ORDER — LIDOCAINE 2% (20 MG/ML) 5 ML SYRINGE
INTRAMUSCULAR | Status: DC | PRN
  Administered 2023-04-10: 30 mg via INTRAVENOUS

## 2023-04-10 MED ORDER — ACETAMINOPHEN 500 MG PO TABS: 1000.0000 mg | ORAL_TABLET | Freq: Once | ORAL | Status: DC

## 2023-04-10 MED ORDER — DEXMEDETOMIDINE HCL IN NACL 400 MCG/100ML IV SOLN
INTRAVENOUS | Status: DC | PRN
Start: 2023-04-10 — End: 2023-04-10
  Administered 2023-04-10: 12 ug via INTRAVENOUS

## 2023-04-10 MED ORDER — CELECOXIB 200 MG PO CAPS: 200.0000 mg | ORAL_CAPSULE | Freq: Once | ORAL | Status: DC

## 2023-04-10 MED ORDER — CHLORHEXIDINE GLUCONATE 0.12 % MT SOLN: 15.0000 mL | Freq: Once | OROMUCOSAL | Status: AC

## 2023-04-10 MED ORDER — TRAMADOL HCL 50 MG PO TABS
50.0000 mg | ORAL_TABLET | Freq: Four times a day (QID) | ORAL | 0 refills | Status: AC | PRN
Start: 2023-04-10 — End: 2023-04-15

## 2023-04-10 MED ORDER — AMOXICILLIN-POT CLAVULANATE 875-125 MG PO TABS: 1.0000 | ORAL_TABLET | Freq: Two times a day (BID) | ORAL | 0 refills | Status: AC

## 2023-04-10 MED ORDER — ACETAMINOPHEN 160 MG/5ML PO SOLN: 325.0000 mg | ORAL | Status: DC | PRN

## 2023-04-10 MED ORDER — ORAL CARE MOUTH RINSE: 15.0000 mL | Freq: Once | OROMUCOSAL | Status: AC

## 2023-04-10 MED ORDER — ACETAMINOPHEN 325 MG PO TABS: 325.0000 mg | ORAL_TABLET | ORAL | Status: DC | PRN

## 2023-04-10 MED ORDER — OXYCODONE HCL 5 MG PO TABS
ORAL_TABLET | ORAL | Status: AC
  Filled 2023-04-10: qty 1

## 2023-04-10 MED ORDER — CARVEDILOL 12.5 MG PO TABS
12.5000 mg | ORAL_TABLET | Freq: Once | ORAL | Status: AC
  Administered 2023-04-10: 12.5 mg via ORAL
  Filled 2023-04-10: qty 1

## 2023-04-10 MED ORDER — FENTANYL CITRATE (PF) 100 MCG/2ML IJ SOLN
INTRAMUSCULAR | Status: AC
  Administered 2023-04-10: 100 ug via INTRAVENOUS
  Filled 2023-04-10: qty 2

## 2023-04-10 MED ORDER — ACETAMINOPHEN 500 MG PO TABS
1000.0000 mg | ORAL_TABLET | Freq: Once | ORAL | Status: AC
  Administered 2023-04-10: 1000 mg via ORAL
  Filled 2023-04-10: qty 2

## 2023-04-10 MED ORDER — PROPOFOL 10 MG/ML IV BOLUS
INTRAVENOUS | Status: AC
  Filled 2023-04-10: qty 20

## 2023-04-10 MED ORDER — LACTATED RINGERS IV SOLN: INTRAVENOUS | Status: DC

## 2023-04-10 MED ORDER — OXYCODONE HCL 5 MG PO TABS
5.0000 mg | ORAL_TABLET | Freq: Once | ORAL | Status: AC | PRN
  Administered 2023-04-10: 5 mg via ORAL

## 2023-04-10 MED ORDER — CHLORHEXIDINE GLUCONATE 0.12 % MT SOLN
OROMUCOSAL | Status: AC
  Administered 2023-04-10: 15 mL via OROMUCOSAL
  Filled 2023-04-10: qty 15

## 2023-04-10 MED ORDER — MIDAZOLAM HCL 2 MG/2ML IJ SOLN
INTRAMUSCULAR | Status: DC | PRN
  Administered 2023-04-10: 2 mg via INTRAVENOUS

## 2023-04-10 MED ORDER — SODIUM CHLORIDE 0.9 % IV SOLN
INTRAVENOUS | Status: AC
  Filled 2023-04-10: qty 20

## 2023-04-10 MED ORDER — SODIUM CHLORIDE 0.9 % IV SOLN
2.0000 g | Freq: Once | INTRAVENOUS | Status: AC
  Administered 2023-04-10: 2 g via INTRAVENOUS

## 2023-04-10 MED ORDER — MEPERIDINE HCL 25 MG/ML IJ SOLN: 6.2500 mg | INTRAMUSCULAR | Status: DC | PRN

## 2023-04-10 MED ORDER — PROPOFOL 10 MG/ML IV BOLUS
INTRAVENOUS | Status: DC | PRN
  Administered 2023-04-10: 200 mg via INTRAVENOUS

## 2023-04-10 MED ORDER — ONDANSETRON HCL 4 MG/2ML IJ SOLN: 4.0000 mg | Freq: Once | INTRAMUSCULAR | Status: DC | PRN

## 2023-04-10 MED ORDER — 0.9 % SODIUM CHLORIDE (POUR BTL) OPTIME
TOPICAL | Status: DC | PRN
Start: 2023-04-10 — End: 2023-04-10
  Administered 2023-04-10: 1000 mL

## 2023-04-10 MED ORDER — FENTANYL CITRATE (PF) 100 MCG/2ML IJ SOLN: 100.0000 ug | Freq: Once | INTRAMUSCULAR | Status: AC

## 2023-04-10 MED ORDER — METRONIDAZOLE 500 MG/100ML IV SOLN
500.0000 mg | Freq: Once | INTRAVENOUS | Status: AC
  Administered 2023-04-10: 500 mg via INTRAVENOUS

## 2023-04-10 MED ORDER — METRONIDAZOLE 500 MG/100ML IV SOLN
INTRAVENOUS | Status: AC
  Filled 2023-04-10: qty 100

## 2023-04-10 MED ORDER — FENTANYL CITRATE (PF) 100 MCG/2ML IJ SOLN
25.0000 ug | INTRAMUSCULAR | Status: DC | PRN
  Administered 2023-04-10 (×3): 50 ug via INTRAVENOUS

## 2023-04-10 MED ORDER — FENTANYL CITRATE (PF) 250 MCG/5ML IJ SOLN
INTRAMUSCULAR | Status: AC
  Filled 2023-04-10: qty 5

## 2023-04-10 SURGICAL SUPPLY — 33 items
BAG COUNTER SPONGE SURGICOUNT (BAG) ×1 IMPLANT
BAG SPNG CNTER NS LX DISP (BAG) ×1
BNDG GAUZE DERMACEA FLUFF 4 (GAUZE/BANDAGES/DRESSINGS) IMPLANT
BNDG GZE DERMACEA 4 6PLY (GAUZE/BANDAGES/DRESSINGS) ×1
BRIEF MESH DISP LRG (UNDERPADS AND DIAPERS) ×1 IMPLANT
CANISTER SUCT 3000ML PPV (MISCELLANEOUS) ×1 IMPLANT
COVER SURGICAL LIGHT HANDLE (MISCELLANEOUS) ×1 IMPLANT
ELECT CAUTERY BLADE 6.4 (BLADE) ×1 IMPLANT
ELECT REM PT RETURN 9FT ADLT (ELECTROSURGICAL)
ELECTRODE REM PT RTRN 9FT ADLT (ELECTROSURGICAL) IMPLANT
GAUZE PAD ABD 8X10 STRL (GAUZE/BANDAGES/DRESSINGS) ×1 IMPLANT
GAUZE SPONGE 4X4 12PLY STRL (GAUZE/BANDAGES/DRESSINGS) ×1 IMPLANT
GLOVE BIO SURGEON STRL SZ7 (GLOVE) ×1 IMPLANT
GLOVE BIOGEL PI IND STRL 7.5 (GLOVE) ×1 IMPLANT
GOWN STRL REUS W/ TWL LRG LVL3 (GOWN DISPOSABLE) ×2 IMPLANT
GOWN STRL REUS W/TWL LRG LVL3 (GOWN DISPOSABLE) ×2
KIT BASIN OR (CUSTOM PROCEDURE TRAY) ×1 IMPLANT
KIT TURNOVER KIT B (KITS) ×1 IMPLANT
NDL HYPO 25GX1X1/2 BEV (NEEDLE) ×1 IMPLANT
NEEDLE HYPO 25GX1X1/2 BEV (NEEDLE) ×1 IMPLANT
NS IRRIG 1000ML POUR BTL (IV SOLUTION) ×1 IMPLANT
PACK GENERAL/GYN (CUSTOM PROCEDURE TRAY) IMPLANT
PACK LITHOTOMY IV (CUSTOM PROCEDURE TRAY) ×1 IMPLANT
PACKING GAUZE IODOFORM 1INX5YD (GAUZE/BANDAGES/DRESSINGS) IMPLANT
PAD ARMBOARD 7.5X6 YLW CONV (MISCELLANEOUS) ×1 IMPLANT
PENCIL SMOKE EVACUATOR (MISCELLANEOUS) ×1 IMPLANT
SURGILUBE 2OZ TUBE FLIPTOP (MISCELLANEOUS) IMPLANT
SWAB COLLECTION DEVICE MRSA (MISCELLANEOUS) IMPLANT
SWAB CULTURE ESWAB REG 1ML (MISCELLANEOUS) IMPLANT
SYR CONTROL 10ML LL (SYRINGE) ×1 IMPLANT
TOWEL GREEN STERILE (TOWEL DISPOSABLE) ×1 IMPLANT
TOWEL GREEN STERILE FF (TOWEL DISPOSABLE) ×1 IMPLANT
UNDERPAD 30X36 HEAVY ABSORB (UNDERPADS AND DIAPERS) ×1 IMPLANT

## 2023-04-10 NOTE — Anesthesia Preprocedure Evaluation (Signed)
Anesthesia Evaluation  Patient identified by MRN, date of birth, ID band Patient awake    Reviewed: Allergy & Precautions, NPO status , Patient's Chart, lab work & pertinent test results  History of Anesthesia Complications Negative for: history of anesthetic complications  Airway Mallampati: II  TM Distance: >3 FB Neck ROM: Full    Dental  (+) Chipped, Dental Advisory Given, Teeth Intact,    Pulmonary former smoker, PE (IVC filter) 10/07/2021 SARS coronavirus NEG   Pulmonary exam normal breath sounds clear to auscultation       Cardiovascular hypertension, Pt. on home beta blockers and Pt. on medications (-) angina + CAD, + Past MI, + Cardiac Stents and + Peripheral Vascular Disease  Normal cardiovascular exam Rhythm:Regular Rate:Normal  07/2021 ECHO:    1. Left ventricular ejection fraction, by estimation, is 60 to 65%. The  left ventricle has normal function. The left ventricle has no regional  wall motion abnormalities. The left ventricular internal cavity size was  mildly dilated. Left ventricular  diastolic parameters were normal.   2. Right ventricular systolic function is mildly reduced. The right  ventricular size is normal.   3. The mitral valve is normal in structure.   4. Aortic valve regurgitation is not visualized. Aortic valve sclerosis  is present, with no evidence of aortic valve stenosis.   5. Pulmonic valve regurgitation is moderate.   6. The inferior vena cava is normal in size with greater than 50%  respiratory variability, suggesting right atrial pressure of 3 mmHg.      Neuro/Psych CVA (speech difficulty), Residual Symptoms    GI/Hepatic Neg liver ROS,,,H/o small bowel perf   Endo/Other  diabetes, Oral Hypoglycemic Agents    Renal/GU negative Renal ROS   Prostate cancer    Musculoskeletal  (+) Arthritis ,    Abdominal   Peds  Hematology  (+) Blood dyscrasia, anemia Xarelto: last dose  sat 6:30pm   Anesthesia Other Findings   Reproductive/Obstetrics                             Anesthesia Physical Anesthesia Plan  ASA: 3  Anesthesia Plan: General   Post-op Pain Management: Tylenol PO (pre-op)* and Celebrex PO (pre-op)*   Induction:   PONV Risk Score and Plan: 3 and Ondansetron, Treatment may vary due to age or medical condition, Dexamethasone and Midazolam  Airway Management Planned: LMA and Oral ETT  Additional Equipment: None  Intra-op Plan:   Post-operative Plan: Extubation in OR  Informed Consent: I have reviewed the patients History and Physical, chart, labs and discussed the procedure including the risks, benefits and alternatives for the proposed anesthesia with the patient or authorized representative who has indicated his/her understanding and acceptance.     Dental advisory given  Plan Discussed with: CRNA  Anesthesia Plan Comments: (See PAT note 09/26/2021)        Anesthesia Quick Evaluation

## 2023-04-10 NOTE — Discharge Instructions (Addendum)
ANORECTAL SURGERY: POST OP INSTRUCTIONS  DIET: Follow a light bland diet the first 24 hours after arrival home, such as soup, liquids, crackers, etc.  Be sure to include lots of fluids daily.  Avoid fast food or heavy meals as your are more likely to get nauseated.  Eat a low fat diet the next few days after surgery.   Some bleeding with bowel movements is expected for the first couple of days but this should stop in between bowel movements  Take your usually prescribed home medications unless otherwise directed. No foreign bodies per rectum for the next 3 months (enemas, etc). Your abscess cavity has been packed with a single piece of gauze. This will need to be removed at home the evening of 04/11/2023 (Saturday night). You may resume your blood thinner after the packing is out. You may then cover your wound with dry gauze (this can be purchased over the counter at the drug store called "4 x 4 gauze." Ok to bathe normally getting wound wet in shower with soap/water over wound.  PAIN CONTROL: It is helpful to take an over-the-counter pain medication regularly for the first few days/weeks.  Choose from the following that works best for you: Ibuprofen (Advil, etc) Three 200mg  tabs every 6 hours as needed. Acetaminophen (Tylenol, etc) 500-650mg  every 6 hours as needed NOTE: You may take both of these medications together - most patients find it most helpful when alternating between the two (i.e. Ibuprofen at 6am, tylenol at 9am, ibuprofen at 12pm ..Marland Kitchen) A  prescription for pain medication may have been prescribed for you at discharge.  Take your pain medication as prescribed.  If you are having problems/concerns with the prescription medicine, please call us for further advice. Antibiotics: Augmentin has been sent to your pharmacy - take this for the next 3 days as prescribed.  Avoid getting constipated.  Between the surgery and the pain medications, it is common to experience some constipation.   Increasing fluid intake (64oz of water per day) and taking a fiber supplement (such as Metamucil, Citrucel, FiberCon) 1-2 times a day regularly will usually help prevent this problem from occurring.  Take Miralax (over the counter) 1-2x/day while taking a narcotic pain medication. If no bowel movement after 48hours, you may additionally take a laxative like a bottle of Milk of Magnesia which can be purchased over the counter. Avoid enemas.   Watch out for diarrhea.  If you have many loose bowel movements, simplify your diet to bland foods.  Stop any stool softeners and decrease your fiber supplement. If this worsens or does not improve, please call us.  Wash / shower every day.  If you were discharged with a dressing, you may remove this the day after your surgery. You may shower normally, getting soap/water on your wound, particularly after bowel movements.  Soaking in a warm bath filled a couple inches ("Sitz bath") is a great way to clean the area after a bowel movement and many patients find it is a way to soothe the area.  ACTIVITIES as tolerated:   You may resume regular (light) daily activities beginning the next day--such as daily self-care, walking, climbing stairs--gradually increasing activities as tolerated.  If you can walk 30 minutes without difficulty, it is safe to try more intense activity such as jogging, treadmill, bicycling, low-impact aerobics, etc. Refrain from any heavy lifting or straining for the first 2 weeks after your procedure, particularly if your surgery was for hemorrhoids. Avoid activities that make your  pain worse You may drive when you are no longer taking prescription pain medication, you can comfortably wear a seatbelt, and you can safely maneuver your car and apply brakes.  FOLLOW UP in our office Please call [facility] at [phone] to set up an appointment to see your surgeon in the office for a follow-up appointment approximately 2 weeks after your surgery. Make  sure that you call for this appointment the day you arrive home to insure a convenient appointment time.  9. If you have disability or family leave forms that need to be completed, you may have them completed by your primary care physician's office; for return to work instructions, please ask our office staff and they will be happy to assist you in obtaining this documentation   When to call us [phone]: Poor pain control Reactions / problems with new medications (rash/itching, etc)  Fever over 101.5 F (38.5 C) Inability to urinate Nausea/vomiting Worsening swelling or bruising Continued bleeding from incision. Increased pain, redness, or drainage from the incision  The clinic staff is available to answer your questions during regular business hours (8:30am-5pm).  Please don't hesitate to call and ask to speak to one of our nurses for clinical concerns.   A surgeon from [facility] is always on call at the hospitals   If you have a medical emergency, go to the nearest emergency room or call 911.   [facility] [address], [place], [place]  [address] MAIN: [phone] FAX: [phone] [email or web address]

## 2023-04-10 NOTE — Op Note (Signed)
04/10/2023  9:50 AM  PATIENT:  Dylan [name]  57 y.o. male  Patient Care Team: Genevie Ann as PCP - General (Physician Assistant) Thurmon Fair, MD as PCP - Cardiology (Cardiology) Charlott Rakes, MD as Consulting Physician (Gastroenterology) Benjiman Core, MD (Inactive) as Consulting Physician (Oncology) Crist Fat, MD as Attending Physician (Urology) Margaretmary Dys, MD as Consulting Physician (Radiation Oncology)  PRE-OPERATIVE DIAGNOSIS:  Left posterior perirectal abscess  POST-OPERATIVE DIAGNOSIS:  Left posterior perirectal abscess  PROCEDURE:   Incision and drainage of perirectal Anorectal exam under anesthesia  SURGEON:  Surgeon(s): Andria Meuse, MD  ASSISTANT: OR Staff   ANESTHESIA:   local and general  SPECIMEN:  No Specimen  DISPOSITION OF SPECIMEN:  N/A  COUNTS:  Sponge, needle, and instrument counts were reported correct x2 at conclusion.  EBL: 10 mL  PLAN OF CARE: Discharge to home after PACU if patient is comfortable with this  PATIENT DISPOSITION:  PACU - hemodynamically stable.  OR FINDINGS: Left posterior perirectal abscess, approximately ping-pong ball size. Containing milky [name] pus. Wound packed with moist Kerlex gauze  DESCRIPTION: The patient was identified in the preoperative holding area and taken to the OR where he was placed on the operating room table. SCDs were placed.  General anesthesia was induced without difficulty. The patient was then positioned in high lithotomy with Allen stirrups. Pressure points were then evaluated and padded.  He was then prepped and draped in usual sterile fashion.  A surgical timeout was performed indicating the correct patient, procedure, and positioning.  A perianal block was performed using 0.25% Marcaine with epinephrine.  After ascertaining that an appropriate level of anesthesia had been achieved, a well lubricated digital rectal exam was performed. This demonstrated  fullness along the left posterior..  A Hill-Ferguson anoscope was into the anal canal and circumferential inspection demonstrated healthy appearing anoderm without any evident granulation tissue or fissures.  No significant internal hemorrhoidal component.  There is no obvious internal opening to suggest a fistula present.  External skin are normal.  There is a palpable fullness in the left posterior postanal space.  An incision is created in a radial fashion in the left posterior position.  Just deep to the external sphincter muscle, encountered the abscess cavity containing approximately 10 cc of purulent fluid.  All loculations were then broken up bluntly.  The wound was slightly enlarged to facilitate ongoing drainage.  Hemostasis was achieved electrocautery.  The wound was irrigated.  Given that he does take blood thinners at baseline, to assist in maintaining hemostasis, a piece of moist Kerlix was brought onto the field and packed into the wound.  All sponge, needle, and instrument counts were reported correct.  He was then taken out of lithotomy position and a dressing consisting of 4 x 4's, ABD, mesh underwear was placed.  He was awakened from anesthesia, extubated, transported recovery in satisfactory condition.  DISPOSITION: PACU in satisfactory condition.

## 2023-04-10 NOTE — Anesthesia Procedure Notes (Signed)
Procedure Name: LMA Insertion Date/Time: 04/10/2023 9:24 AM  Performed by: Gwenyth Allegra, CRNAPre-anesthesia Checklist: Patient identified, Emergency Drugs available, Suction available, Patient being monitored and Timeout performed Patient Re-evaluated:Patient Re-evaluated prior to induction Oxygen Delivery Method: Circle system utilized Preoxygenation: Pre-oxygenation with 100% oxygen Induction Type: IV induction LMA Size: 5.0 Number of attempts: 1 Placement Confirmation: positive ETCO2 and breath sounds checked- equal and bilateral Tube secured with: Tape Dental Injury: Teeth and Oropharynx as per pre-operative assessment

## 2023-04-10 NOTE — Anesthesia Postprocedure Evaluation (Signed)
Anesthesia Post Note  Patient: Dylan [name]  Procedure(s) Performed: IRRIGATION AND DEBRIDEMENT PERIRECTAL ABSCESS     Patient location during evaluation: PACU Anesthesia Type: General Level of consciousness: awake and alert Pain management: pain level controlled Vital Signs Assessment: post-procedure vital signs reviewed and stable Respiratory status: spontaneous breathing, nonlabored ventilation, respiratory function stable and patient connected to nasal cannula oxygen Cardiovascular status: blood pressure returned to baseline and stable Postop Assessment: no apparent nausea or vomiting Anesthetic complications: no   No notable events documented.  Last Vitals:  Vitals:   04/10/23 1100 04/10/23 1115  BP: 121/75 131/81  Pulse: 66 69  Resp: 13 13  Temp:  36.6 C  SpO2: 93% 96%    Last Pain:  Vitals:   04/10/23 1100  TempSrc:   PainSc: 4                  Roberto Hlavaty

## 2023-04-10 NOTE — Transfer of Care (Signed)
Immediate Anesthesia Transfer of Care Note  Patient: FINESSE SHEAD  Procedure(s) Performed: IRRIGATION AND DEBRIDEMENT PERIRECTAL ABSCESS  Patient Location: PACU  Anesthesia Type:General  Level of Consciousness: awake and alert   Airway & Oxygen Therapy: Patient Spontanous Breathing and Patient connected to nasal cannula oxygen  Post-op Assessment: Report given to RN and Post -op Vital signs reviewed and stable  Post vital signs: Reviewed and stable  Last Vitals:  Vitals Value Taken Time  BP 133/84 04/10/23 1030  Temp 36.6 C 04/10/23 1000  Pulse 66 04/10/23 1034  Resp 11 04/10/23 1034  SpO2 98 % 04/10/23 1034  Vitals shown include unfiled device data.  Last Pain:  Vitals:   04/10/23 1000  TempSrc:   PainSc: 0-No pain      Patients Stated Pain Goal: 2 (04/10/23 0717)  Complications: No notable events documented.

## 2023-04-10 NOTE — H&P (Signed)
CC: Here today for surgery  HPI: Dylan [name] is an 57 y.o. male with history of HTN, HLD, DVT (on Xarelto, indwelling IVC filter), whom is seen in the office today as a referral by Dr. Meredith Mody for evaluation of possible hemorrhoids.   He reports a 2-day history of perianal pain.  It began gradually and then yesterday became more intense.  This morning when he sat down to attempt have a bowel movement he noted severe pain and hopped in the shower.  He feels some firmness when wiping.  He denies any prior anorectal surgeries or procedures.  He denies any history of similar in the past.  He denies any fever or chills.  He denies any drainage.   He reports 1-2 bowel movements daily that is soft.  He spends upwards of 20 minutes on the commode.  Drinks at least 64 ounces of water per day.  He is not currently taking any stool softeners, fiber supplements, nor laxatives.  CT Pelvis 04/10/23 1. 3.9 cm left posterior perianal abscess. 2. Mild rectal wall thickening with mild hazy edema in the perirectal fat, suggesting mild proctitis. 3. Aortic atherosclerosis.  PMH: HTN, HLD, DVT (on Xarelto, indwelling IVC filter)   PSH: Prior inguinal hernia surgery (Dr. Michaell Cowing); Umbilical hernia repair (2019, Dr. Magnus Ivan)   FHx: Denies any known family history of colorectal, breast, endometrial or ovarian cancer   Social Hx: Denies use of tobacco/illicit drug.  Social EtOH use.  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  He is here today by himself.   Past Medical History:  Diagnosis Date   Arthritis    CAD (coronary artery disease) 07/2010; 07/2012; 08/2014   cardiologist--- dr croitoru;  1) 11-28-'11: NSTEMI - 100% RCA - PCI (3 Taxus ION DES 3.0 x 28, 3.0 x 24 & 3.5 x 12 distal-prox); b) 12/ 2013 ISR of RCA stent - -AnngioSculpt PTCA; c) NSTEMI 1/'16: mild ISR with Thrombosis of RCA Stents (in setting of Mesenteric Vein Thrombosis) - Cutting Balloon PTCA; c.  Re-look Cath 07/2015 & 07/2016: ~15% ISR in RCA,20% p-mLAD & o-pCx.   Chronic gout    01-07-2021  last flare-up approxl 12-17-2020 bilteral great toes   DM type 2 (diabetes mellitus, type 2) (HCC)    followed by pcp   (01-07-2021 per pt does not check blood sugar at home)   Edema of both lower extremities    01-07-2021  pt states wears compression hose   Essential hypertension    followed by pcp and cardiology   History of concussion    01-07-2021  per pt x4 as child, last one age 73 without residual   History of CVA with residual deficit 07/01/2010   embolic left MCA infarct post PCI stenting approx one hour  (01-07-2021 per pt has residual minimal intermittant aphasia )   History of non-ST elevation myocardial infarction (NSTEMI) 07-01-2010  and 01/ 2016   History of prostatitis    History of resection of small bowel 08/28/2014   small bowel perforation,  s/p resction    History of retinal tear 04/2013   right micro-rupture , no occlusion, retinal artery branch w/ vision loss,  per imaging no infarct/ occlusion   History of thrombosis 08/2014   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, provoked by testosterone injection,  08-09-2014 s/p thrombolytic lysis VIR    Hyperlipidemia with target LDL less than 70 09/01/2014   May-Thurner syndrome  Myocardial infarction (HCC) 2011   and 2016   Nocturia    PAC (premature atrial contraction)    pt hx palpitations, event monitor 08-18-2017  showed NS w/ occasional PACs, rare PVCs   Peripheral vascular disease (HCC)    Perirectal abscess    Prostate cancer [facility]) urologist-- dr herrick/  oncologist-- dr Kathrynn Running   dx 09-04-2020,  Stage T1c, Gleason 4+3    S/P drug eluting coronary stent placement 07/01/2010   DES x3  to prox and distal RCA   S/P percutaneous transluminal coronary angioplasty 12/ 2013 and 01/ 2016   for ISR  RCA   Stroke (HCC) 2011   some residual aphasia    Past Surgical History:  Procedure Laterality  Date   BOWEL RESECTION N/A 08/28/2014   Procedure: SMALL BOWEL RESECTION;  Surgeon: Abigail Miyamoto, MD;  Location: [facility] OR;  Service: General;  Laterality: N/A;   CARDIAC CATHETERIZATION N/A 07/20/2015   Procedure: Left Heart Cath and Coronary Angiography;  Surgeon: Peter M Swaziland, MD;  Location: [facility] INVASIVE CV LAB;  Service: Cardiovascular;  15% ISR in the RCA stent segment, 20% proximal-mid LAD as well as ostial and proximal circumflex. Normal LV function.   CARDIAC CATHETERIZATION N/A 07/23/2016   Procedure: Left Heart Cath and Coronary Angiography;  Surgeon: Peter M Swaziland, MD;  Location: [facility] INVASIVE CV LAB;  Service: Cardiovascular: E 55-65%. ~15% diffuse RCA ISR, ~20% diffuse pLAD & pCx   CORONARY ANGIOPLASTY  07/06/2012; 08/15/2014   a) 12/'13: PTCA of 80% ISR mRCA - AngioSculpt; b) PTCA of  ISR/thrombosis   CORONARY ANGIOPLASTY WITH STENT PLACEMENT  07-01-2010  dr harding @[facility]    inferior wall MI - 3 Taxus Ion DES (3.0x68mm, 3.0x21mm, 3.5x32mm) to prox and mid RCA   CORONARY ULTRASOUND/IVUS Left 04/29/2021   Procedure: Intravascular Ultrasound/IVUS;  Surgeon: Maeola Harman, MD;  Location: [facility] INVASIVE CV LAB;  Service: Cardiovascular;  Laterality: Left;   CYSTOSCOPY N/A 01/10/2021   Procedure: CYSTOSCOPY FLEXIBLE;  Surgeon: Crist Fat, MD;  Location: [facility];  Service: Urology;  Laterality: N/A;   INCISIONAL HERNIA REPAIR N/A 10/15/2017   Procedure: HERNIA REPAIR INCISIONAL;  Surgeon: Abigail Miyamoto, MD;  Location: [facility] ORS;  Service: General;  Laterality: N/A;   INGUINAL HERNIA REPAIR Bilateral 05/2010   INSERTION OF MESH N/A 10/15/2017   Procedure: INSERTION OF MESH;  Surgeon: Abigail Miyamoto, MD;  Location: [facility] ORS;  Service: General;  Laterality: N/A;   IVC FILTER INSERTION N/A 08/19/2021   Procedure: IVC FILTER INSERTION;  Surgeon: Maeola Harman, MD;  Location: [facility] INVASIVE CV LAB;  Service: Cardiovascular;  Laterality: N/A;   KNEE  ARTHROPLASTY Left 10/09/2021   Procedure: COMPUTER ASSISTED TOTAL KNEE ARTHROPLASTY;  Surgeon: Samson Frederic, MD;  Location: [facility] ORS;  Service: Orthopedics;  Laterality: Left;  150   KNEE ARTHROSCOPY W/ MENISCAL REPAIR Left 06/2011   KNEE ARTHROSCOPY WITH MEDIAL MENISECTOMY Right 08/08/2022   Procedure: KNEE ARTHROSCOPY WITH PARTIAL MEDIAL MENISECTOMY;  Surgeon: Yolonda Kida, MD;  Location: [facility];  Service: Orthopedics;  Laterality: Right;  60   LAPAROSCOPIC APPENDECTOMY N/A 08/28/2014   Procedure: APPENDECTOMY LAPAROSCOPIC;  Surgeon: Abigail Miyamoto, MD;  Location: [facility] OR;  Service: General;  Laterality: N/A;   LAPAROSCOPY  08/28/2014   Procedure: LAPAROSCOPY DIAGNOSTIC;  Surgeon: Abigail Miyamoto, MD;  Location: [facility] OR;  Service: General;;   LEFT HEART CATHETERIZATION WITH CORONARY ANGIOGRAM N/A 06/11/2012   Procedure: LEFT HEART CATHETERIZATION WITH CORONARY ANGIOGRAM;  Surgeon: Rachelle Hora  Croitoru, MD;  Location: [facility] CATH LAB;  Service: Cardiovascular;  Laterality: N/A;   LEFT HEART CATHETERIZATION WITH CORONARY ANGIOGRAM N/A 04/18/2013   Procedure: LEFT HEART CATHETERIZATION WITH CORONARY ANGIOGRAM;  Surgeon: Lennette Bihari, MD;  Location: [facility] CATH LAB;  Service: Cardiovascular;  Laterality: N/A;   LEFT HEART CATHETERIZATION WITH CORONARY ANGIOGRAM N/A 08/15/2014   Procedure: LEFT HEART CATHETERIZATION WITH CORONARY ANGIOGRAM;  Surgeon: Marykay Lex, MD;  Location: [facility] CATH LAB;  Service: Cardiovascular;  Laterality: N/A;   LOWER EXTREMITY VENOGRAPHY Left 04/29/2021   Procedure: LOWER EXTREMITY VENOGRAPHY;  Surgeon: Maeola Harman, MD;  Location: [facility] INVASIVE CV LAB;  Service: Cardiovascular;  Laterality: Left;   LOWER EXTREMITY VENOGRAPHY Left 08/19/2021   Procedure: LOWER EXTREMITY VENOGRAPHY;  Surgeon: Maeola Harman, MD;  Location: [facility] INVASIVE CV LAB;  Service: Cardiovascular;  Laterality: Left;   PERCUTANEOUS CORONARY STENT INTERVENTION (PCI-S) N/A  07/06/2012   Procedure: PERCUTANEOUS CORONARY STENT INTERVENTION (PCI-S);  Surgeon: Runell Gess, MD;  Location: [facility] CATH LAB;  Service: Cardiovascular;  Laterality: N/A;   PERIPHERAL VASCULAR INTERVENTION Left 08/19/2021   Procedure: PERIPHERAL VASCULAR INTERVENTION;  Surgeon: Maeola Harman, MD;  Location: [facility] INVASIVE CV LAB;  Service: Cardiovascular;  Laterality: Left;  Common Iliac Venous   PERIPHERAL VASCULAR THROMBECTOMY Left 04/29/2021   Procedure: PERIPHERAL VASCULAR THROMBECTOMY;  Surgeon: Maeola Harman, MD;  Location: [facility] INVASIVE CV LAB;  Service: Cardiovascular;  Laterality: Left;   RADIOACTIVE SEED IMPLANT N/A 01/10/2021   Procedure: RADIOACTIVE SEED IMPLANT/BRACHYTHERAPY IMPLANT;  Surgeon: Crist Fat, MD;  Location: [facility];  Service: Urology;  Laterality: N/A;   SEPTOPLASTY  11-01-2001 @ [facility]   SPACE OAR INSTILLATION N/A 01/10/2021   Procedure: SPACE OAR INSTILLATION;  Surgeon: Crist Fat, MD;  Location: [facility];  Service: Urology;  Laterality: N/A;   TONSILLECTOMY AND ADENOIDECTOMY  age 32   TRANSTHORACIC ECHOCARDIOGRAM  08/15/2014   Moderate concentric LVH. EF 60-65% with no regional WMA. Gr 1 DD, otherwise normal    Family History  Problem Relation Age of Onset   Atrial fibrillation Mother    Mitral valve prolapse Mother    Coronary artery disease Father        CABG, aortic aneursym,   Heart attack Father    Heart attack Paternal Uncle    Heart attack Paternal Grandfather    Clotting disorder Neg Hx     Social:  reports that he quit smoking about 12 years ago. His smoking use included cigarettes. He started smoking about 27 years ago. He has a 15 pack-year smoking history. His smokeless tobacco use includes snuff. He reports current alcohol use of about 7.0 - 14.0 standard drinks of alcohol per week. He reports that he does not use drugs.  Allergies:  Allergies  Allergen Reactions   Crestor  [Rosuvastatin] Nausea Only and Other (See Comments)    Severe muscle pain and nausea   Zofran [Ondansetron Hcl] Nausea And Vomiting   Ace Inhibitors Cough   Lipitor [Atorvastatin] Other (See Comments)    myalgia   Codeine Itching    Medications: I have reviewed the patient's current medications.  Results for orders placed or performed during the hospital encounter of 04/10/23 (from the past 48 hour(s))  Glucose, capillary     Status: Abnormal   Collection Time: 04/10/23  6:40 AM  Result Value Ref Range   Glucose-Capillary 113 (H) 70 - 99 mg/dL    Comment: Glucose reference range applies  only to samples taken after fasting for at least 8 hours.    CT PELVIS W CONTRAST  Result Date: 04/09/2023 CLINICAL DATA:  History of anal abscess rectal pain for 3 days EXAM: CT PELVIS WITH CONTRAST TECHNIQUE: Multidetector CT imaging of the pelvis was performed using the standard protocol following the bolus administration of intravenous contrast. RADIATION DOSE REDUCTION: This exam was performed according to the departmental dose-optimization program which includes automated exposure control, adjustment of the mA and/or kV according to patient size and/or use of iterative reconstruction technique. CONTRAST:  OMNIPAQUE IOHEXOL 300 MG/ML  SOLN COMPARISON:  CT 03/13/2022 FINDINGS: Urinary Tract:  Bladder is unremarkable. Bowel: Mild rectal wall thickening with mild hazy edema in the perirectal fat. Rim enhancing left posterior perianal fluid collection measuring 3.3 cm transverse high by 3.9 cm AP by 3.7 cm craniocaudad. Vascular/Lymphatic: Partially visualized stent within the distal IVC. Moderate aortic atherosclerosis without aneurysm. No suspicious lymph nodes. Reproductive:  Multiple prostate seeds. Other:  Negative for pelvic effusion or free air. Musculoskeletal: No acute osseous abnormality IMPRESSION: 1. 3.9 cm left posterior perianal abscess. 2. Mild rectal wall thickening with mild hazy edema in  the perirectal fat, suggesting mild proctitis. 3. Aortic atherosclerosis. Aortic Atherosclerosis (ICD10-I70.0). Electronically Signed   By: Jasmine Pang M.D.   On: 04/09/2023 16:25     PE Blood pressure (!) 155/83, pulse 84, temperature 98.6 F (37 C), temperature source Oral, resp. rate 18, height 6\' 1"  (1.854 m), weight 108.9 kg, SpO2 97%. Constitutional: NAD; conversant Eyes: Moist conjunctiva; no lid lag; anicteric Lungs: Normal respiratory effort CV: RRR Psychiatric: Appropriate affect  Results for orders placed or performed during the hospital encounter of 04/10/23 (from the past 48 hour(s))  Glucose, capillary     Status: Abnormal   Collection Time: 04/10/23  6:40 AM  Result Value Ref Range   Glucose-Capillary 113 (H) 70 - 99 mg/dL    Comment: Glucose reference range applies only to samples taken after fasting for at least 8 hours.    CT PELVIS W CONTRAST  Result Date: 04/09/2023 CLINICAL DATA:  History of anal abscess rectal pain for 3 days EXAM: CT PELVIS WITH CONTRAST TECHNIQUE: Multidetector CT imaging of the pelvis was performed using the standard protocol following the bolus administration of intravenous contrast. RADIATION DOSE REDUCTION: This exam was performed according to the departmental dose-optimization program which includes automated exposure control, adjustment of the mA and/or kV according to patient size and/or use of iterative reconstruction technique. CONTRAST:  OMNIPAQUE IOHEXOL 300 MG/ML  SOLN COMPARISON:  CT 03/13/2022 FINDINGS: Urinary Tract:  Bladder is unremarkable. Bowel: Mild rectal wall thickening with mild hazy edema in the perirectal fat. Rim enhancing left posterior perianal fluid collection measuring 3.3 cm transverse high by 3.9 cm AP by 3.7 cm craniocaudad. Vascular/Lymphatic: Partially visualized stent within the distal IVC. Moderate aortic atherosclerosis without aneurysm. No suspicious lymph nodes. Reproductive:  Multiple prostate seeds. Other:   Negative for pelvic effusion or free air. Musculoskeletal: No acute osseous abnormality IMPRESSION: 1. 3.9 cm left posterior perianal abscess. 2. Mild rectal wall thickening with mild hazy edema in the perirectal fat, suggesting mild proctitis. 3. Aortic atherosclerosis. Aortic Atherosclerosis (ICD10-I70.0). Electronically Signed   By: Jasmine Pang M.D.   On: 04/09/2023 16:25    A/P: Dylan [name] is an 57 y.o. male with hx of HTN, HLD, DVT (on Xarelto, indwelling IVC filter) here for evaluation of perianal pain, clinically appears suspicious for perirectal abscess.  -The anatomy  and physiology of the anal canal was discussed with the patient. The pathophysiology of anal abscess and fistula was discussed as well, working to provide a good understanding. -We have reviewed options going forward including further observation vs surgery - incision and drainage of perirectal abscess; anorectal exam under anesthesia -The planned procedure, material risks (including, but not limited to, pain, bleeding, infection, scarring, need for blood transfusion, damage to anal sphincter, incontinence of gas and/or stool, need for additional procedures, recurrence, pneumonia, heart attack, stroke, death) benefits and alternatives to surgery were discussed at length. I noted a good probability that the procedure would help improve their symptoms. The patient's questions were answered to his and his wife's satisfaction, they voiced understanding and elected to proceed with surgery. Additionally, we discussed typical postoperative expectations and the recovery process.  -Discussed not eating or drinking anything for now until further evaluation  Marin Olp, MD [facility]

## 2023-04-11 ENCOUNTER — Encounter (HOSPITAL_COMMUNITY): Payer: Self-pay | Admitting: Surgery

## 2023-07-15 ENCOUNTER — Telehealth: Payer: Self-pay | Admitting: Pharmacy Technician

## 2023-07-15 ENCOUNTER — Other Ambulatory Visit (HOSPITAL_COMMUNITY): Payer: Self-pay

## 2023-07-15 NOTE — Telephone Encounter (Addendum)
Pharmacy Patient Advocate Encounter   Received notification from Fax that prior authorization for repatha is required/requested.   Insurance verification completed.   The patient is insured through [facility] ADVANTAGE/RX ADVANCE .   Per test claim: PA required; PA submitted to above mentioned insurance via CoverMyMeds Key/confirmation #/EOC [facility] Status is pending

## 2023-07-17 ENCOUNTER — Other Ambulatory Visit: Payer: Self-pay | Admitting: Cardiovascular Disease

## 2023-07-17 NOTE — Telephone Encounter (Signed)
Pharmacy Patient Advocate Encounter  Received notification from [facility] ADVANTAGE/RX ADVANCE that Prior Authorization for repatha has been APPROVED from 07/17/23 to 07/15/24   PA #/Case ID/Reference #: [id number]

## 2023-07-19 ENCOUNTER — Other Ambulatory Visit: Payer: Self-pay | Admitting: Cardiovascular Disease

## 2023-07-20 ENCOUNTER — Other Ambulatory Visit: Payer: Self-pay

## 2023-07-20 MED ORDER — OLMESARTAN MEDOXOMIL 40 MG PO TABS: 40.0000 mg | ORAL_TABLET | Freq: Every day | ORAL | 0 refills | Status: AC

## 2023-07-21 ENCOUNTER — Other Ambulatory Visit: Payer: Self-pay

## 2023-07-21 MED ORDER — FUROSEMIDE 40 MG PO TABS: ORAL_TABLET | ORAL | 0 refills | Status: DC

## 2023-07-21 MED ORDER — CARVEDILOL 12.5 MG PO TABS: ORAL_TABLET | ORAL | 0 refills | Status: DC

## 2023-08-10 ENCOUNTER — Other Ambulatory Visit: Payer: Self-pay | Admitting: Cardiovascular Disease

## 2023-08-11 ENCOUNTER — Other Ambulatory Visit: Payer: Self-pay | Admitting: Cardiovascular Disease

## 2023-08-11 DIAGNOSIS — I2510 Atherosclerotic heart disease of native coronary artery without angina pectoris: Secondary | ICD-10-CM

## 2023-08-11 DIAGNOSIS — E785 Hyperlipidemia, unspecified: Secondary | ICD-10-CM

## 2023-10-06 ENCOUNTER — Other Ambulatory Visit: Payer: Self-pay | Admitting: Cardiovascular Disease

## 2023-10-06 MED ORDER — CARVEDILOL 12.5 MG PO TABS: ORAL_TABLET | ORAL | 0 refills | Status: DC

## 2023-11-05 ENCOUNTER — Other Ambulatory Visit: Payer: Self-pay | Admitting: Cardiovascular Disease

## 2023-11-22 ENCOUNTER — Other Ambulatory Visit: Payer: Self-pay | Admitting: Cardiovascular Disease

## 2023-11-22 DIAGNOSIS — I2510 Atherosclerotic heart disease of native coronary artery without angina pectoris: Secondary | ICD-10-CM

## 2023-11-22 DIAGNOSIS — E785 Hyperlipidemia, unspecified: Secondary | ICD-10-CM

## 2023-12-04 ENCOUNTER — Other Ambulatory Visit: Payer: Self-pay | Admitting: Cardiovascular Disease

## 2023-12-06 NOTE — Progress Notes (Unsigned)
 Cardiology Office Note:    Date:  12/07/2023   ID:  Dylan [name], DOB Jan 06, 1966, MRN [id number]  PCP:  Dylan Ana, PA-C  Cardiologist:  Dylan Rumple, MD   Referring MD: Dylan Ana, PA-C   No chief complaint on file.   History of Present Illness:    Dylan [name] is a 58 y.o. male with a hx of premature onset CAD, HTN, hypercholesterolemia with statin myopathy, type II DM, history of gout, remote stroke without sequelae (following cardiac catheterization 2013), PE and extensive left lower extremity DVT 2022 requiring thrombectomy/stenting and IVC filter (subsequent migration of iliac stent which is caught in the IVC filter), prostate cancer status post brachytherapy and external XRT returning for routine follow-up.   The patient specifically denies any chest pain at rest or with exertion, dyspnea at rest or with exertion, orthopnea, paroxysmal nocturnal dyspnea, syncope, palpitations, focal neurological deficits, intermittent claudication, lower extremity edema, unexplained weight gain, cough, hemoptysis or wheezing.  He has not had any falls or bleeding problems with Xarelto , but does have a few bruises.  Dylan [name] is asymptomatic from a cardiac point of view and has not required any revascularization procedure since January 2016.  After starting treatment with Mounjaro he is lost 50 pounds.  He feels that he has not been in as good as shape as he is today for more than 20 years.  He has no difficulty doing heavy physical labor including dragging to deer through the woods after hunting this winter.  Metabolic control is excellent.  Hemoglobin A1c is now down and fully normal range at 5.5% (on Synjardy  and Mounjaro).  His most recent lipid profile from last August, before he lost all this weight showed an LDL cholesterol of 73, HDL 57 and triglycerides normal at 115.  He has normal renal function.  His 2 oldest children from his first marriage are now themselves  married and he has a 38-month-old grandchild.  Dylan [name] has 2 younger children, now ages 39 and 86 from his second marriage.  He had extensive stenting of the right coronary artery in 2011 and then required angioplasty for restenosis in 2013 and thrombosis in January 2016  (during treatment with androgen supplements he presented with mesenteric vein thrombosis and had a non-STEMI at the same time). In December 2016 and again in December 2017, his cardiac catheterization showed no evidence of restenosis disease progression.  He has preserved left ventricular systolic function.  Low risk no stress test October 2017 and normal nuclear stress test November 2018.  On the other hand he had a lot of other health issues in 2022 in early 2023.  His PSA was markedly elevated and he underwent both radioactive seed implants and IMRT (urology Dylan [name]).  Following COVID-19 infection, in September 2022 he developed extensive deep venous thrombosis of the left lower extremity from the thigh to the foot (he had previous extensive DVT from the superior mesenteric vein to the portal vein during treatment with injectable androgens in 2016, requiring thrombolysis).  He underwent percutaneous thrombectomy and placement of a left common iliac venous stent as well as an IVC filter after being diagnosed with May Thurner syndrome (Dylan [name]).  He had total left knee replacement in early March 2023 but once he recovered from that has stopped anticoagulants.  He is still taking aspirin  81 mg daily.  Following hematology consultation he underwent extensive work-up for hypercoagulable states.  He did not have evidence of anticardiolipin antibodies although  lupus anticoagulant was positive.  Protein C, protein S and Antithrombin activity was normal and beta-2 glycoprotein was low.  Genetic testing for factor V Leiden and prothrombin gene mutation was negative.  He is occasional troubled by palpitations and his Holter monitor  showed that these were due to PACs, without any complex atrial or ventricular arrhythmia.  Atrial fibrillation has never been detected.  Past Medical History:  Diagnosis Date   Arthritis    CAD (coronary artery disease) 07/2010; 07/2012; 08/2014   cardiologist--- dr Dylan [name];  1) 11-28-'11: NSTEMI - 100% RCA - PCI (3 Taxus ION DES 3.0 x 28, 3.0 x 24 & 3.5 x 12 distal-prox); b) 12/ 2013 ISR of RCA stent - -AnngioSculpt PTCA; c) NSTEMI 1/'16: mild ISR with Thrombosis of RCA Stents (in setting of Mesenteric Vein Thrombosis) - Cutting Balloon PTCA; c. Re-look Cath 07/2015 & 07/2016: ~15% ISR in RCA,20% p-mLAD & o-pCx.   Chronic gout    01-07-2021  last flare-up approxl 12-17-2020 bilteral great toes   DM type 2 (diabetes mellitus, type 2) (HCC)    followed by pcp   (01-07-2021 per pt does not check blood sugar at home)   Edema of both lower extremities    01-07-2021  pt states wears compression hose   Essential hypertension    followed by pcp and cardiology   History of concussion    01-07-2021  per pt x4 as child, last one age 68 without residual   History of CVA with residual deficit 07/01/2010   embolic left MCA infarct post PCI stenting approx one hour  (01-07-2021 per pt has residual minimal intermittant aphasia )   History of non-ST elevation myocardial infarction (NSTEMI) 07-01-2010  and 01/ 2016   History of prostatitis    History of resection of small bowel 08/28/2014   small bowel perforation,  s/p resction    History of retinal tear 04/2013   right micro-rupture , no occlusion, retinal artery branch w/ vision loss,  per imaging no infarct/ occlusion   History of thrombosis 08/2014   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, provoked by testosterone injection,  08-09-2014 s/p thrombolytic lysis VIR    Hyperlipidemia with target LDL less than 70 09/01/2014   May-Thurner syndrome    Myocardial infarction (HCC) 2011   and 2016   Nocturia    PAC (premature atrial  contraction)    pt hx palpitations, event monitor 08-18-2017  showed NS w/ occasional PACs, rare PVCs   Peripheral vascular disease (HCC)    Perirectal abscess    Prostate cancer [facility]) urologist-- dr herrick/  oncologist-- dr Lorri Rota   dx 09-04-2020,  Stage T1c, Gleason 4+3    S/P drug eluting coronary stent placement 07/01/2010   DES x3  to prox and distal RCA   S/P percutaneous transluminal coronary angioplasty 12/ 2013 and 01/ 2016   for ISR  RCA   Stroke (HCC) 2011   some residual aphasia    Past Surgical History:  Procedure Laterality Date   BOWEL RESECTION N/A 08/28/2014   Procedure: SMALL BOWEL RESECTION;  Surgeon: Oza Blumenthal, MD;  Location: [facility] OR;  Service: General;  Laterality: N/A;   CARDIAC CATHETERIZATION N/A 07/20/2015   Procedure: Left Heart Cath and Coronary Angiography;  Surgeon: Peter M Swaziland, MD;  Location: [facility] INVASIVE CV LAB;  Service: Cardiovascular;  15% ISR in the RCA stent segment, 20% proximal-mid LAD as well as ostial and proximal circumflex. Normal LV function.   CARDIAC CATHETERIZATION  N/A 07/23/2016   Procedure: Left Heart Cath and Coronary Angiography;  Surgeon: Peter M Swaziland, MD;  Location: [facility] INVASIVE CV LAB;  Service: Cardiovascular: E 55-65%. ~15% diffuse RCA ISR, ~20% diffuse pLAD & pCx   CORONARY ANGIOPLASTY  07/06/2012; 08/15/2014   a) 12/'13: PTCA of 80% ISR mRCA - AngioSculpt; b) PTCA of  ISR/thrombosis   CORONARY ANGIOPLASTY WITH STENT PLACEMENT  07-01-2010  dr harding @[facility]    inferior wall MI - 3 Taxus Ion DES (3.0x3mm, 3.0x22mm, 3.5x11mm) to prox and mid RCA   CORONARY ULTRASOUND/IVUS Left 04/29/2021   Procedure: Intravascular Ultrasound/IVUS;  Surgeon: Adine Hoof, MD;  Location: [facility] INVASIVE CV LAB;  Service: Cardiovascular;  Laterality: Left;   CYSTOSCOPY N/A 01/10/2021   Procedure: CYSTOSCOPY FLEXIBLE;  Surgeon: Andrez Banker, MD;  Location: [facility];  Service: Urology;  Laterality: N/A;   INCISION AND  DRAINAGE PERIRECTAL ABSCESS N/A 04/10/2023   Procedure: IRRIGATION AND DEBRIDEMENT PERIRECTAL ABSCESS;  Surgeon: Melvenia Stabs, MD;  Location: [facility] OR;  Service: General;  Laterality: N/A;   INCISIONAL HERNIA REPAIR N/A 10/15/2017   Procedure: HERNIA REPAIR INCISIONAL;  Surgeon: Oza Blumenthal, MD;  Location: [facility] ORS;  Service: General;  Laterality: N/A;   INGUINAL HERNIA REPAIR Bilateral 05/2010   INSERTION OF MESH N/A 10/15/2017   Procedure: INSERTION OF MESH;  Surgeon: Oza Blumenthal, MD;  Location: [facility] ORS;  Service: General;  Laterality: N/A;   IVC FILTER INSERTION N/A 08/19/2021   Procedure: IVC FILTER INSERTION;  Surgeon: Adine Hoof, MD;  Location: [facility] INVASIVE CV LAB;  Service: Cardiovascular;  Laterality: N/A;   KNEE ARTHROPLASTY Left 10/09/2021   Procedure: COMPUTER ASSISTED TOTAL KNEE ARTHROPLASTY;  Surgeon: Adonica Hoose, MD;  Location: [facility] ORS;  Service: Orthopedics;  Laterality: Left;  150   KNEE ARTHROSCOPY W/ MENISCAL REPAIR Left 06/2011   KNEE ARTHROSCOPY WITH MEDIAL MENISECTOMY Right 08/08/2022   Procedure: KNEE ARTHROSCOPY WITH PARTIAL MEDIAL MENISECTOMY;  Surgeon: Janeth Medicus, MD;  Location: [facility];  Service: Orthopedics;  Laterality: Right;  60   LAPAROSCOPIC APPENDECTOMY N/A 08/28/2014   Procedure: APPENDECTOMY LAPAROSCOPIC;  Surgeon: Oza Blumenthal, MD;  Location: [facility] OR;  Service: General;  Laterality: N/A;   LAPAROSCOPY  08/28/2014   Procedure: LAPAROSCOPY DIAGNOSTIC;  Surgeon: Oza Blumenthal, MD;  Location: [facility] OR;  Service: General;;   LEFT HEART CATHETERIZATION WITH CORONARY ANGIOGRAM N/A 06/11/2012   Procedure: LEFT HEART CATHETERIZATION WITH CORONARY ANGIOGRAM;  Surgeon: Dylan Rumple, MD;  Location: [facility] CATH LAB;  Service: Cardiovascular;  Laterality: N/A;   LEFT HEART CATHETERIZATION WITH CORONARY ANGIOGRAM N/A 04/18/2013   Procedure: LEFT HEART CATHETERIZATION WITH CORONARY ANGIOGRAM;  Surgeon: Millicent Ally, MD;   Location: [facility] CATH LAB;  Service: Cardiovascular;  Laterality: N/A;   LEFT HEART CATHETERIZATION WITH CORONARY ANGIOGRAM N/A 08/15/2014   Procedure: LEFT HEART CATHETERIZATION WITH CORONARY ANGIOGRAM;  Surgeon: Arleen Lacer, MD;  Location: [facility] CATH LAB;  Service: Cardiovascular;  Laterality: N/A;   LOWER EXTREMITY VENOGRAPHY Left 04/29/2021   Procedure: LOWER EXTREMITY VENOGRAPHY;  Surgeon: Adine Hoof, MD;  Location: [facility] INVASIVE CV LAB;  Service: Cardiovascular;  Laterality: Left;   LOWER EXTREMITY VENOGRAPHY Left 08/19/2021   Procedure: LOWER EXTREMITY VENOGRAPHY;  Surgeon: Adine Hoof, MD;  Location: [facility] INVASIVE CV LAB;  Service: Cardiovascular;  Laterality: Left;   PERCUTANEOUS CORONARY STENT INTERVENTION (PCI-S) N/A 07/06/2012   Procedure: PERCUTANEOUS CORONARY STENT INTERVENTION (PCI-S);  Surgeon: Avanell Leigh, MD;  Location: [facility] CATH LAB;  Service: Cardiovascular;  Laterality: N/A;   PERIPHERAL VASCULAR INTERVENTION Left 08/19/2021   Procedure: PERIPHERAL VASCULAR INTERVENTION;  Surgeon: Adine Hoof, MD;  Location: [facility] INVASIVE CV LAB;  Service: Cardiovascular;  Laterality: Left;  Common Iliac Venous   PERIPHERAL VASCULAR THROMBECTOMY Left 04/29/2021   Procedure: PERIPHERAL VASCULAR THROMBECTOMY;  Surgeon: Adine Hoof, MD;  Location: [facility] INVASIVE CV LAB;  Service: Cardiovascular;  Laterality: Left;   RADIOACTIVE SEED IMPLANT N/A 01/10/2021   Procedure: RADIOACTIVE SEED IMPLANT/BRACHYTHERAPY IMPLANT;  Surgeon: Andrez Banker, MD;  Location: [facility];  Service: Urology;  Laterality: N/A;   SEPTOPLASTY  11-01-2001 @ [facility]   SPACE OAR INSTILLATION N/A 01/10/2021   Procedure: SPACE OAR INSTILLATION;  Surgeon: Andrez Banker, MD;  Location: [facility];  Service: Urology;  Laterality: N/A;   TONSILLECTOMY AND ADENOIDECTOMY  age 24   TRANSTHORACIC ECHOCARDIOGRAM  08/15/2014   Moderate concentric LVH. EF 60-65%  with no regional WMA. Gr 1 DD, otherwise normal    Current Medications: No outpatient medications have been marked as taking for the 12/07/23 encounter (Office Visit) with Milanna Kozlov, Karyl Paget, MD.     Allergies:   Crestor  [rosuvastatin ], Zofran  [ondansetron  hcl], Ace inhibitors, Lipitor [atorvastatin], and Codeine    Family History: The patient's family history includes Atrial fibrillation in his mother; Coronary artery disease in his father; Heart attack in his father, paternal grandfather, and paternal uncle; Mitral valve prolapse in his mother. There is no history of Clotting disorder.   EKGs/Labs/Other Studies Reviewed:    The following studies were reviewed today:   EKG:     EKG Interpretation Date/Time:  Monday Dec 07 2023 08:05:51 EDT Ventricular Rate:  69 PR Interval:  172 QRS Duration:  72 QT Interval:  402 QTC Calculation: 430 R Axis:   -4  Text Interpretation: Normal sinus rhythm Possible Inferior infarct , age undetermined When compared with ECG of 21-Jul-2022 09:03, No significant change since last tracing Confirmed by Anastazia Creek [phone]) on 12/07/2023 8:08:03 AM         Recent Labs: 04/09/2023: BUN 17; Creatinine, Ser 1.00; Hemoglobin 13.9; Platelets 289; Potassium 3.3; Sodium 138   07/05/2021 Hemoglobin A1c 7.1%  Recent Lipid Panel    Component Value Date/Time   CHOL 106 07/21/2022 0922   CHOL 123 08/25/2017 0805   TRIG 169 (H) 07/21/2022 0922   HDL 51 07/21/2022 0922   HDL 34 (L) 08/25/2017 0805   CHOLHDL 2.1 07/21/2022 0922   VLDL 34 07/21/2022 0922   LDLCALC 21 07/21/2022 0922   LDLCALC 16 08/25/2017 0805  07/01/2021 Cholesterol 106, triglycerides 95, HDL 55, LDL 32  Physical Exam:    VS:  Ht 6\' 1"  (1.854 m)   Wt 103.3 kg   SpO2 97%   BMI 30.05 kg/m     Wt Readings from Last 3 Encounters:  12/07/23 103.3 kg  04/10/23 108.9 kg  04/09/23 108.9 kg      General: Alert, oriented x3, no distress, overweight/borderline obese, but also  appears very fit and muscular. Head: no evidence of trauma, PERRL, EOMI, no exophtalmos or lid lag, no myxedema, no xanthelasma; normal ears, nose and oropharynx Neck: normal jugular venous pulsations and no hepatojugular reflux; brisk carotid pulses without delay and no carotid bruits Chest: clear to auscultation, no signs of consolidation by percussion or palpation, normal fremitus, symmetrical and full respiratory excursions Cardiovascular: normal position and quality of the apical impulse, regular rhythm, normal first and second heart sounds, no murmurs, rubs  or gallops Abdomen: no tenderness or distention, no masses by palpation, no abnormal pulsatility or arterial bruits, normal bowel sounds, no hepatosplenomegaly Extremities: no clubbing, cyanosis or edema; 2+ radial, ulnar and brachial pulses bilaterally; 2+ right femoral, posterior tibial and dorsalis pedis pulses; 2+ left femoral, posterior tibial and dorsalis pedis pulses; no subclavian or femoral bruits Neurological: grossly nonfocal Psych: Normal mood and affect    ASSESSMENT:    1. Coronary artery disease involving native coronary artery of native heart without angina pectoris   2. History of pulmonary embolism   3. Dyslipidemia   4. Statin myopathy   5. Non-insulin  dependent type 2 diabetes mellitus (HCC)   6. Finding of multiple premature atrial contractions by electrocardiography   7. Essential hypertension     PLAN:    In order of problems listed above:  CAD: Denies angina even with intense physical activity.  Has not required revascularization since 2016 and is asymptomatic.  Plan is for lifelong Xarelto  anticoagulation since she has an IVC filter that cannot be removed.  Therefore he is not on aspirin . History of PE/DVT: Had May Thurner syndrome.  Previous DVT involving the mesenteric/portal circulation was up during androgen supplement injection.  Although he does not have any identifiable hypercoagulable risk  factors, he does appear to have a high propensity for both arterial and venous thrombosis.  His left iliac vein stent migrated and caught on his IVC filter.  The plan is lifelong anticoagulation. HLP: All his lipid parameters are at target of very close to target on current medication.  Suspect that his LDL cholesterol even lower since he has lost so much weight.  When he was taking Repatha  every 2 weeks his LDL dropped to 32 so he has not been taking roughly every 3 weeks.  Will have a repeat lipid profile with his PCP later this year. DM: Exception control with most recent hemoglobin A1c 5.5 %.  On Synjardy  and Mounjaro, on excellent combination for optimized future cardiovascular risk. PACs: Currently asymptomatic.  Continue beta-blocker.  We have never documented atrial fibrillation. HTN: Well-controlled.  Beta-blocker and ARB are optimal choice for control since he has diabetes and coronary disease.   Medication Adjustments/Labs and Tests Ordered: Current medicines are reviewed at length with the patient today.  Concerns regarding medicines are outlined above.  Orders Placed This Encounter  Procedures   EKG 12-Lead   No orders of the defined types were placed in this encounter.  There are no Patient Instructions on file for this visit.   Signed, Dylan Rumple, MD  12/07/2023 8:08 AM    [facility]

## 2023-12-07 ENCOUNTER — Ambulatory Visit: Payer: Self-pay | Attending: Cardiovascular Disease | Admitting: Cardiovascular Disease

## 2023-12-07 ENCOUNTER — Encounter: Payer: Self-pay | Admitting: Cardiovascular Disease

## 2023-12-07 VITALS — BP 130/76 | HR 69 | Ht 73.0 in | Wt 227.8 lb

## 2023-12-07 DIAGNOSIS — Z95828 Presence of other vascular implants and grafts: Secondary | ICD-10-CM

## 2023-12-07 DIAGNOSIS — I491 Atrial premature depolarization: Secondary | ICD-10-CM

## 2023-12-07 DIAGNOSIS — E785 Hyperlipidemia, unspecified: Secondary | ICD-10-CM | POA: Diagnosis not present

## 2023-12-07 DIAGNOSIS — I2510 Atherosclerotic heart disease of native coronary artery without angina pectoris: Secondary | ICD-10-CM

## 2023-12-07 DIAGNOSIS — T466X5D Adverse effect of antihyperlipidemic and antiarteriosclerotic drugs, subsequent encounter: Secondary | ICD-10-CM

## 2023-12-07 DIAGNOSIS — E119 Type 2 diabetes mellitus without complications: Secondary | ICD-10-CM

## 2023-12-07 DIAGNOSIS — Z86711 Personal history of pulmonary embolism: Secondary | ICD-10-CM | POA: Diagnosis not present

## 2023-12-07 DIAGNOSIS — I10 Essential (primary) hypertension: Secondary | ICD-10-CM

## 2023-12-07 DIAGNOSIS — G720 Drug-induced myopathy: Secondary | ICD-10-CM

## 2023-12-07 NOTE — Patient Instructions (Signed)
 Medication Instructions:  No changes *If you need a refill on your cardiac medications before your next appointment, please call your pharmacy*  Follow-Up: At [facility], you and your health needs are our priority.  As part of our continuing mission to provide you with exceptional heart care, our providers are all part of one team.  This team includes your primary Cardiologist (physician) and Advanced Practice Providers or APPs (Physician Assistants and Nurse Practitioners) who all work together to provide you with the care you need, when you need it.  Your next appointment:   1 year(s)  Provider:   Luana Rumple, MD    We recommend signing up for the patient portal called "MyChart".  Sign up information is provided on this After Visit Summary.  MyChart is used to connect with patients for Virtual Visits (Telemedicine).  Patients are able to view lab/test results, encounter notes, upcoming appointments, etc.  Non-urgent messages can be sent to your provider as well.   To learn more about what you can do with MyChart, go to [email or web address].

## 2023-12-25 ENCOUNTER — Other Ambulatory Visit: Payer: Self-pay | Admitting: Cardiovascular Disease

## 2023-12-27 ENCOUNTER — Other Ambulatory Visit: Payer: Self-pay | Admitting: Cardiovascular Disease

## 2023-12-29 ENCOUNTER — Telehealth: Payer: Self-pay

## 2023-12-29 NOTE — Telephone Encounter (Signed)
   Patient Name: Dylan [name]  DOB: [birth year] MRN: [id number]  Primary Cardiologist: Luana Rumple, MD  Chart reviewed as part of pre-operative protocol coverage. Given past medical history and time since last visit, based on ACC/AHA guidelines, [name] is at acceptable risk for the planned procedure without further cardiovascular testing.   Patient can hold Xarelto  2 to 3 days prior to procedure and should restart postprocedure when surgically safe and hemostasis is achieved.  The patient was advised that if he develops new symptoms prior to surgery to contact our office to arrange for a follow-up visit, and he verbalized understanding.  I will route this recommendation to the requesting party via Epic fax function and remove from pre-op pool.  Please call with questions.  Francene Ing, Retha Cast, NP 12/29/2023, 5:05 PM

## 2023-12-29 NOTE — Telephone Encounter (Signed)
 Low risk for the planned elbow surgery. OK to hold Xarelto  for 2-3 days preop.

## 2023-12-29 NOTE — Telephone Encounter (Signed)
 Good Morning Dr. Alvis Ba  We have received a surgical clearance request for [name] for a right elbow mass excision. They were seen recently in clinic on 12/07/2023. Can you please comment on surgical clearance for his upcoming procedure. Please forward you guidance and recommendations to P CV DIV PREOP  Thanks, Charles Connor, NP

## 2023-12-29 NOTE — Telephone Encounter (Signed)
   Pre-operative Risk Assessment    Patient Name: Dylan [name]  DOB: September 14, 1965 MRN: [id number]   Date of last office visit: 12/07/23 Luana Rumple, MD Date of next office visit: NONE   Request for Surgical Clearance    Procedure:  RIGHT ELBOW MASS EXCISION  Date of Surgery:  Clearance 01/07/24                                Surgeon:  DR Marilyn Shropshire Surgeon's Group or Practice Name:  [facility] Phone number:  [phone] Fax number:  [phone]  ATTN: Valinda Gault WILLS   Type of Clearance Requested:   - Medical  - Pharmacy:  Hold Rivaroxaban  (Xarelto )     Type of Anesthesia:  General    Additional requests/questions:    SignedCollin Deal   12/29/2023, 12:31 PM

## 2024-01-06 ENCOUNTER — Other Ambulatory Visit: Payer: Self-pay | Admitting: Cardiovascular Disease

## 2024-01-07 ENCOUNTER — Other Ambulatory Visit: Payer: Self-pay

## 2024-01-11 LAB — SURGICAL PATHOLOGY

## 2024-04-04 ENCOUNTER — Other Ambulatory Visit: Payer: Self-pay | Admitting: Vascular Surgery

## 2024-05-06 ENCOUNTER — Other Ambulatory Visit: Payer: Self-pay | Admitting: Cardiovascular Disease

## 2024-09-06 ENCOUNTER — Other Ambulatory Visit: Payer: Self-pay | Admitting: Cardiovascular Disease

## 2024-09-08 NOTE — Telephone Encounter (Signed)
 In accordance with refill protocols, please review and address the following requirements before this medication refill can be authorized:  Labs  Pt had labs done on 06/13/2024 within 180 days in careeverywhere, but pt needs Mg lab done, it was not done within 180 days
